# Patient Record
Sex: Male | Born: 1960 | Race: White | Hispanic: No | Marital: Single | Smoking: Current every day smoker
Health system: Southern US, Community
[De-identification: ages and names within clinical notes are randomized; demographics above are authoritative.]

## PROBLEM LIST (undated history)

## (undated) DIAGNOSIS — G4733 Obstructive sleep apnea (adult) (pediatric): Secondary | ICD-10-CM

## (undated) DIAGNOSIS — F329 Major depressive disorder, single episode, unspecified: Secondary | ICD-10-CM

## (undated) DIAGNOSIS — K219 Gastro-esophageal reflux disease without esophagitis: Secondary | ICD-10-CM

## (undated) DIAGNOSIS — T7840XA Allergy, unspecified, initial encounter: Secondary | ICD-10-CM

## (undated) DIAGNOSIS — G4730 Sleep apnea, unspecified: Secondary | ICD-10-CM

## (undated) DIAGNOSIS — I10 Essential (primary) hypertension: Secondary | ICD-10-CM

## (undated) DIAGNOSIS — I739 Peripheral vascular disease, unspecified: Secondary | ICD-10-CM

## (undated) DIAGNOSIS — F32A Depression, unspecified: Secondary | ICD-10-CM

## (undated) DIAGNOSIS — R112 Nausea with vomiting, unspecified: Secondary | ICD-10-CM

## (undated) DIAGNOSIS — Z21 Asymptomatic human immunodeficiency virus [HIV] infection status: Secondary | ICD-10-CM

## (undated) DIAGNOSIS — E785 Hyperlipidemia, unspecified: Secondary | ICD-10-CM

## (undated) DIAGNOSIS — E119 Type 2 diabetes mellitus without complications: Secondary | ICD-10-CM

## (undated) DIAGNOSIS — B20 Human immunodeficiency virus [HIV] disease: Secondary | ICD-10-CM

## (undated) DIAGNOSIS — Z9889 Other specified postprocedural states: Secondary | ICD-10-CM

## (undated) DIAGNOSIS — F419 Anxiety disorder, unspecified: Secondary | ICD-10-CM

## (undated) DIAGNOSIS — I6200 Nontraumatic subdural hemorrhage, unspecified: Secondary | ICD-10-CM

## (undated) DIAGNOSIS — D126 Benign neoplasm of colon, unspecified: Secondary | ICD-10-CM

## (undated) HISTORY — DX: Essential (primary) hypertension: I10

## (undated) HISTORY — DX: Type 2 diabetes mellitus without complications: E11.9

## (undated) HISTORY — DX: Anxiety disorder, unspecified: F41.9

## (undated) HISTORY — DX: Obstructive sleep apnea (adult) (pediatric): G47.33

## (undated) HISTORY — DX: Benign neoplasm of colon, unspecified: D12.6

## (undated) HISTORY — DX: Nontraumatic subdural hemorrhage, unspecified: I62.00

## (undated) HISTORY — DX: Major depressive disorder, single episode, unspecified: F32.9

## (undated) HISTORY — DX: Asymptomatic human immunodeficiency virus (hiv) infection status: Z21

## (undated) HISTORY — DX: Hyperlipidemia, unspecified: E78.5

## (undated) HISTORY — DX: Depression, unspecified: F32.A

## (undated) HISTORY — PX: CARDIAC CATHETERIZATION: SHX172

## (undated) HISTORY — DX: Allergy, unspecified, initial encounter: T78.40XA

## (undated) HISTORY — DX: Human immunodeficiency virus (HIV) disease: B20

---

## 2000-12-04 ENCOUNTER — Ambulatory Visit (HOSPITAL_COMMUNITY): Admission: RE | Admit: 2000-12-04 | Discharge: 2000-12-04 | Payer: Self-pay | Admitting: General Surgery

## 2000-12-04 ENCOUNTER — Encounter: Payer: Self-pay | Admitting: General Surgery

## 2000-12-04 HISTORY — PX: OTHER SURGICAL HISTORY: SHX169

## 2005-01-21 ENCOUNTER — Encounter: Admission: RE | Admit: 2005-01-21 | Discharge: 2005-01-21 | Payer: Self-pay | Admitting: Gastroenterology

## 2010-10-16 ENCOUNTER — Ambulatory Visit (INDEPENDENT_AMBULATORY_CARE_PROVIDER_SITE_OTHER): Payer: BC Managed Care – PPO

## 2010-10-16 DIAGNOSIS — B20 Human immunodeficiency virus [HIV] disease: Secondary | ICD-10-CM

## 2010-10-16 DIAGNOSIS — Z23 Encounter for immunization: Secondary | ICD-10-CM

## 2010-10-16 DIAGNOSIS — Z113 Encounter for screening for infections with a predominantly sexual mode of transmission: Secondary | ICD-10-CM

## 2010-10-16 DIAGNOSIS — Z79899 Other long term (current) drug therapy: Secondary | ICD-10-CM

## 2010-10-16 DIAGNOSIS — Z111 Encounter for screening for respiratory tuberculosis: Secondary | ICD-10-CM

## 2010-10-16 LAB — URINALYSIS
Bilirubin Urine: NEGATIVE
Glucose, UA: NEGATIVE mg/dL
Hgb urine dipstick: NEGATIVE
Ketones, ur: NEGATIVE mg/dL
Leukocytes, UA: NEGATIVE
Nitrite: NEGATIVE
Protein, ur: NEGATIVE mg/dL
Specific Gravity, Urine: 1.023 (ref 1.005–1.030)
Urobilinogen, UA: 0.2 mg/dL (ref 0.0–1.0)
pH: 7 (ref 5.0–8.0)

## 2010-10-17 LAB — CBC WITH DIFFERENTIAL/PLATELET
Basophils Absolute: 0 10*3/uL (ref 0.0–0.1)
Basophils Relative: 0 % (ref 0–1)
Eosinophils Absolute: 0.1 10*3/uL (ref 0.0–0.7)
Eosinophils Relative: 2 % (ref 0–5)
HCT: 41.4 % (ref 39.0–52.0)
Hemoglobin: 13.5 g/dL (ref 13.0–17.0)
Lymphocytes Relative: 43 % (ref 12–46)
Lymphs Abs: 2.9 10*3/uL (ref 0.7–4.0)
MCH: 31.8 pg (ref 26.0–34.0)
MCHC: 32.6 g/dL (ref 30.0–36.0)
MCV: 97.4 fL (ref 78.0–100.0)
Monocytes Absolute: 0.5 10*3/uL (ref 0.1–1.0)
Monocytes Relative: 7 % (ref 3–12)
Neutro Abs: 3.2 10*3/uL (ref 1.7–7.7)
Neutrophils Relative %: 48 % (ref 43–77)
Platelets: 251 10*3/uL (ref 150–400)
RBC: 4.25 MIL/uL (ref 4.22–5.81)
RDW: 13.5 % (ref 11.5–15.5)
WBC: 6.7 10*3/uL (ref 4.0–10.5)

## 2010-10-17 LAB — COMPLETE METABOLIC PANEL WITH GFR
ALT: 22 U/L (ref 0–53)
AST: 20 U/L (ref 0–37)
Albumin: 4.8 g/dL (ref 3.5–5.2)
Alkaline Phosphatase: 54 U/L (ref 39–117)
BUN: 12 mg/dL (ref 6–23)
CO2: 22 mEq/L (ref 19–32)
Calcium: 9.3 mg/dL (ref 8.4–10.5)
Chloride: 105 mEq/L (ref 96–112)
Creat: 0.94 mg/dL (ref 0.50–1.35)
GFR, Est African American: >60 mL/min (ref >60)
GFR, Est Non African American: >60 mL/min (ref >60)
Glucose, Bld: 87 mg/dL (ref 70–99)
Potassium: 4.1 mEq/L (ref 3.5–5.3)
Sodium: 135 mEq/L (ref 135–145)
Total Bilirubin: 0.8 mg/dL (ref 0.3–1.2)
Total Protein: 7.6 g/dL (ref 6.0–8.3)

## 2010-10-17 LAB — LIPID PANEL
Cholesterol: 144 mg/dL (ref 0–200)
HDL: 25 mg/dL — ABNORMAL LOW (ref >39)
LDL Cholesterol: 68 mg/dL (ref 0–99)
Total CHOL/HDL Ratio: 5.8 Ratio
Triglycerides: 257 mg/dL — ABNORMAL HIGH (ref <150)
VLDL: 51 mg/dL — ABNORMAL HIGH (ref 0–40)

## 2010-10-17 LAB — HEPATITIS C ANTIBODY: HCV Ab: NEGATIVE

## 2010-10-17 LAB — GC/CHLAMYDIA PROBE AMP, URINE
Chlamydia, Swab/Urine, PCR: NEGATIVE
GC Probe Amp, Urine: NEGATIVE

## 2010-10-17 LAB — RPR

## 2010-10-17 LAB — HEPATITIS B SURFACE ANTIGEN: Hepatitis B Surface Ag: NEGATIVE

## 2010-10-17 LAB — HEPATITIS A ANTIBODY, TOTAL: Hep A Total Ab: NEGATIVE

## 2010-10-17 LAB — HEPATITIS B SURFACE ANTIBODY,QUALITATIVE: Hep B S Ab: POSITIVE — AB

## 2010-10-17 LAB — HEPATITIS B CORE ANTIBODY, TOTAL: Hep B Core Total Ab: POSITIVE — AB

## 2010-10-17 NOTE — Progress Notes (Signed)
Pt is very upset and anxious re: new diagnosis of HIV.  He has always tried to be safe.  He had refrained from anal intercourse with the hopes of not contracting the virus.  He only participates in oral sex and says there is not exchange of body fluids but does not use condom. He continues to repeat he was being :"very careful".

## 2010-10-18 LAB — T-HELPER CELL (CD4) - (RCID CLINIC ONLY)
CD4 % Helper T Cell: 33 % (ref 33–55)
CD4 T Cell Abs: 1020 uL (ref 400–2700)

## 2010-10-19 LAB — TB SKIN TEST: TB Skin Test: NEGATIVE mm

## 2010-10-19 LAB — HIV-1 RNA ULTRAQUANT REFLEX TO GENTYP+
HIV 1 RNA Quant: 2820 copies/mL — ABNORMAL HIGH (ref <20)
HIV-1 RNA Quant, Log: 3.45 {Log} — ABNORMAL HIGH (ref <1.30)

## 2010-10-23 ENCOUNTER — Ambulatory Visit: Payer: Self-pay

## 2010-10-25 LAB — HIV-1 GENOTYPR PLUS

## 2010-11-01 ENCOUNTER — Ambulatory Visit (INDEPENDENT_AMBULATORY_CARE_PROVIDER_SITE_OTHER): Payer: BC Managed Care – PPO | Admitting: Infectious Diseases

## 2010-11-01 ENCOUNTER — Encounter: Payer: Self-pay | Admitting: Infectious Diseases

## 2010-11-01 DIAGNOSIS — E785 Hyperlipidemia, unspecified: Secondary | ICD-10-CM

## 2010-11-01 DIAGNOSIS — I10 Essential (primary) hypertension: Secondary | ICD-10-CM

## 2010-11-01 DIAGNOSIS — Z23 Encounter for immunization: Secondary | ICD-10-CM

## 2010-11-01 DIAGNOSIS — B20 Human immunodeficiency virus [HIV] disease: Secondary | ICD-10-CM

## 2010-11-01 DIAGNOSIS — Z720 Tobacco use: Secondary | ICD-10-CM | POA: Insufficient documentation

## 2010-11-01 DIAGNOSIS — Z21 Asymptomatic human immunodeficiency virus [HIV] infection status: Secondary | ICD-10-CM

## 2010-11-01 DIAGNOSIS — F17200 Nicotine dependence, unspecified, uncomplicated: Secondary | ICD-10-CM

## 2010-11-01 NOTE — Assessment & Plan Note (Signed)
He will continue to follow up at [facility]

## 2010-11-01 NOTE — Assessment & Plan Note (Signed)
His CD4 1020 and VL is 2820. He is doing very well, it is unclear how long he has been infected. He is offered condoms, states he is not sexually active. He has had pnvx in last 5 years, has had flu shot this year. Will send him to [facility] for possible start study. Start Hep A series. Will see him back in 2 months.

## 2010-11-01 NOTE — Assessment & Plan Note (Signed)
counseled to quit.  

## 2010-11-01 NOTE — Progress Notes (Signed)
  Subjective:    Patient ID: Jacob Ali, male    DOB: [birth year], 50 y.o.   MRN: [id number]  HPI 50 yo M with hx of HTN, hyperlipidemia comes to ID clinic after new HIV+ September 2012. States he had test done due to low RBC. Doesn't have that much sex. Denies illnesses- in the spring of 2012 was more tired. Noted to have low RBC and testosterone then.     Review of Systems  Constitutional: Positive for fatigue. Negative for fever, chills and unexpected weight change.  HENT: Negative for mouth sores.   Respiratory: Negative for cough and shortness of breath.   Cardiovascular: Negative for chest pain.  Gastrointestinal: Negative for diarrhea, constipation and blood in stool.  Genitourinary: Negative for dysuria.  Neurological: Negative for numbness.       Objective:   Physical Exam  Constitutional: He appears well-developed and well-nourished.  Eyes: EOM are normal. Pupils are equal, round, and reactive to light.  Neck: Neck supple.  Cardiovascular: Normal rate, regular rhythm and normal heart sounds.   Pulmonary/Chest: Effort normal and breath sounds normal.  Abdominal: Soft. Bowel sounds are normal. There is no tenderness.  Lymphadenopathy:    He has no cervical adenopathy.          Assessment & Plan:

## 2010-11-01 NOTE — Assessment & Plan Note (Signed)
He will continue to follow up at [facility] 

## 2010-11-09 ENCOUNTER — Ambulatory Visit (INDEPENDENT_AMBULATORY_CARE_PROVIDER_SITE_OTHER): Payer: BC Managed Care – PPO | Admitting: *Deleted

## 2010-11-09 VITALS — BP 121/78 | HR 72 | Temp 98.1°F | Resp 18 | Ht 69.0 in | Wt 178.2 lb

## 2010-11-09 DIAGNOSIS — B20 Human immunodeficiency virus [HIV] disease: Secondary | ICD-10-CM

## 2010-11-09 LAB — COMPREHENSIVE METABOLIC PANEL
ALT: 20 U/L (ref 0–53)
AST: 18 U/L (ref 0–37)
Albumin: 4.9 g/dL (ref 3.5–5.2)
Alkaline Phosphatase: 56 U/L (ref 39–117)
BUN: 15 mg/dL (ref 6–23)
CO2: 23 mEq/L (ref 19–32)
Calcium: 9.8 mg/dL (ref 8.4–10.5)
Chloride: 102 mEq/L (ref 96–112)
Creat: 0.94 mg/dL (ref 0.50–1.35)
Glucose, Bld: 84 mg/dL (ref 70–99)
Potassium: 4.2 mEq/L (ref 3.5–5.3)
Sodium: 136 mEq/L (ref 135–145)
Total Bilirubin: 0.6 mg/dL (ref 0.3–1.2)
Total Protein: 7.9 g/dL (ref 6.0–8.3)

## 2010-11-09 NOTE — Progress Notes (Signed)
Patient here for START screening. Informed consent obtained after discussion. He had read over the consent and watched the START video at home before coming in today. He understands he may be randomized to early meds or deferred meds. He denies any current problems or concerns. We attempted the pulmonary spirometry procedure today but were unable to get a qualifying reading. He will attempt it again at the next visit. We will also do fasting labs and EKG at the next visit as well as another CD4. He will return November 13th for the next visit.

## 2010-11-10 LAB — HIV ANTIBODY (ROUTINE TESTING W REFLEX): HIV: REACTIVE

## 2010-11-13 LAB — HIV 1/2 CONFIRMATION
HIV-1 antibody: POSITIVE — AB
HIV-2 Ab: NEGATIVE

## 2010-11-27 ENCOUNTER — Ambulatory Visit (INDEPENDENT_AMBULATORY_CARE_PROVIDER_SITE_OTHER): Payer: BC Managed Care – PPO | Admitting: *Deleted

## 2010-11-27 ENCOUNTER — Encounter: Payer: Self-pay | Admitting: *Deleted

## 2010-11-27 VITALS — BP 140/89 | HR 85 | Temp 98.3°F | Resp 18 | Wt 179.8 lb

## 2010-11-27 DIAGNOSIS — B20 Human immunodeficiency virus [HIV] disease: Secondary | ICD-10-CM

## 2010-11-27 LAB — LIPID PANEL
Cholesterol: 134 mg/dL (ref 0–200)
HDL: 25 mg/dL — ABNORMAL LOW (ref >39)
LDL Cholesterol: 80 mg/dL (ref 0–99)
Total CHOL/HDL Ratio: 5.4 Ratio
Triglycerides: 147 mg/dL (ref <150)
VLDL: 29 mg/dL (ref 0–40)

## 2010-11-27 LAB — POCT URINALYSIS DIPSTICK
Bilirubin, UA: NEGATIVE
Glucose, UA: NEGATIVE
Ketones, UA: NEGATIVE
Leukocytes, UA: NEGATIVE
Nitrite, UA: NEGATIVE
Protein, UA: NEGATIVE
Spec Grav, UA: 1.02
Urobilinogen, UA: 0.2
pH, UA: 7

## 2010-11-27 LAB — BASIC METABOLIC PANEL
BUN: 9 mg/dL (ref 6–23)
CO2: 27 mEq/L (ref 19–32)
Calcium: 9.8 mg/dL (ref 8.4–10.5)
Chloride: 100 mEq/L (ref 96–112)
Creat: 0.92 mg/dL (ref 0.50–1.35)
Glucose, Bld: 96 mg/dL (ref 70–99)
Potassium: 4.5 mEq/L (ref 3.5–5.3)
Sodium: 133 mEq/L — ABNORMAL LOW (ref 135–145)

## 2010-11-27 NOTE — Progress Notes (Signed)
11/27/2010 @ 0900: Pt here for 2nd part of screening for START study. Pt c/o sinus pressure and productive cough that started 11/25/10. Pt states he feels like he is coming down with a cold. He plans to see PA, for this, at his work today. Due to new symptoms pt does not meet eligibility for pulmonary substudy. EKG completed and reads NSR. E-measurement = 160, V6-measurement = 095. Fasting labs and 2nd CD4 drawn; vital signs stable; Informed patient he will be contacted to let him know if meets criteria and if he still wants to enroll.  -- Tacey Heap RN

## 2010-12-21 ENCOUNTER — Encounter: Payer: Self-pay | Admitting: Infectious Diseases

## 2010-12-21 LAB — CD4/CD8 (T-HELPER/T-SUPPRESSOR CELL)
CD4%: 29.3
CD4%: 29.7
CD4: 624
CD4: 908
CD8 % Suppressor T Cell: 57.4
CD8 % Suppressor T Cell: 59.9
CD8: 1205
CD8: 1857

## 2011-01-25 ENCOUNTER — Ambulatory Visit (INDEPENDENT_AMBULATORY_CARE_PROVIDER_SITE_OTHER): Payer: BC Managed Care – PPO | Admitting: *Deleted

## 2011-01-25 VITALS — BP 126/81 | HR 80 | Temp 98.4°F | Resp 18 | Wt 177.2 lb

## 2011-01-25 DIAGNOSIS — Z21 Asymptomatic human immunodeficiency virus [HIV] infection status: Secondary | ICD-10-CM

## 2011-01-25 DIAGNOSIS — B20 Human immunodeficiency virus [HIV] disease: Secondary | ICD-10-CM

## 2011-01-25 LAB — POCT URINALYSIS DIPSTICK
Bilirubin, UA: NEGATIVE
Glucose, UA: NEGATIVE
Ketones, UA: NEGATIVE
Leukocytes, UA: NEGATIVE
Nitrite, UA: NEGATIVE
Protein, UA: NEGATIVE
Spec Grav, UA: >=1.03
Urobilinogen, UA: 0.2
pH, UA: 5.5

## 2011-01-25 NOTE — Progress Notes (Signed)
Patient here for 1 month START study appt. He denies any new problems or concerns. He continues to have some minor drainage and cough issues. He plans to see Dr. Ninetta Lights in April after his next study appt. He will need his second Hep A vaccine then.

## 2011-01-26 LAB — BASIC METABOLIC PANEL
BUN: 15 mg/dL (ref 6–23)
CO2: 23 mEq/L (ref 19–32)
Calcium: 9.4 mg/dL (ref 8.4–10.5)
Chloride: 104 mEq/L (ref 96–112)
Creat: 0.87 mg/dL (ref 0.50–1.35)
Glucose, Bld: 90 mg/dL (ref 70–99)
Potassium: 4.1 mEq/L (ref 3.5–5.3)
Sodium: 135 mEq/L (ref 135–145)

## 2011-01-28 ENCOUNTER — Telehealth: Payer: Self-pay | Admitting: *Deleted

## 2011-01-28 NOTE — Telephone Encounter (Signed)
FMLA forms mailed to [employer], [address] [address] [place], [place] [address].  Copies made and placed in box to be scanned. Wendall Mola CMA

## 2011-01-29 LAB — HIV-1 RNA QUANT-NO REFLEX-BLD
HIV 1 RNA Quant: 2666 copies/mL — ABNORMAL HIGH (ref <20)
HIV-1 RNA Quant, Log: 3.43 {Log} — ABNORMAL HIGH (ref <1.30)

## 2011-02-06 ENCOUNTER — Encounter: Payer: Self-pay | Admitting: Infectious Diseases

## 2011-02-06 ENCOUNTER — Telehealth: Payer: Self-pay | Admitting: *Deleted

## 2011-02-06 LAB — CD4/CD8 (T-HELPER/T-SUPPRESSOR CELL)
CD4%: 27.7
CD4: 942
CD8 % Suppressor T Cell: 61.7
CD8: 2098

## 2011-02-06 NOTE — Telephone Encounter (Signed)
Patient brought in FMLA that needed corrections done.  Dr. Ninetta Lights made changes and it was mailed back to [employer].  Copy put in box to be scanned. Wendall Mola CMA

## 2011-02-12 ENCOUNTER — Encounter: Payer: Self-pay | Admitting: Infectious Diseases

## 2011-02-20 ENCOUNTER — Encounter (HOSPITAL_COMMUNITY): Payer: Self-pay | Admitting: Emergency Medicine

## 2011-02-20 ENCOUNTER — Other Ambulatory Visit: Payer: Self-pay

## 2011-02-20 ENCOUNTER — Inpatient Hospital Stay (HOSPITAL_COMMUNITY)
Admission: EM | Admit: 2011-02-20 | Discharge: 2011-02-25 | DRG: 449 | Disposition: A | Discharge status: Other Acute Inpatient Hospital | Payer: BC Managed Care – PPO | Attending: Internal Medicine | Admitting: Internal Medicine

## 2011-02-20 DIAGNOSIS — R000 Tachycardia, unspecified: Secondary | ICD-10-CM | POA: Diagnosis present

## 2011-02-20 DIAGNOSIS — E871 Hypo-osmolality and hyponatremia: Secondary | ICD-10-CM | POA: Diagnosis present

## 2011-02-20 DIAGNOSIS — S61509A Unspecified open wound of unspecified wrist, initial encounter: Secondary | ICD-10-CM | POA: Diagnosis present

## 2011-02-20 DIAGNOSIS — T1491XA Suicide attempt, initial encounter: Secondary | ICD-10-CM | POA: Diagnosis present

## 2011-02-20 DIAGNOSIS — Z21 Asymptomatic human immunodeficiency virus [HIV] infection status: Secondary | ICD-10-CM | POA: Diagnosis present

## 2011-02-20 DIAGNOSIS — I10 Essential (primary) hypertension: Secondary | ICD-10-CM | POA: Insufficient documentation

## 2011-02-20 DIAGNOSIS — Z720 Tobacco use: Secondary | ICD-10-CM | POA: Insufficient documentation

## 2011-02-20 DIAGNOSIS — F17200 Nicotine dependence, unspecified, uncomplicated: Secondary | ICD-10-CM | POA: Diagnosis present

## 2011-02-20 DIAGNOSIS — T394X2A Poisoning by antirheumatics, not elsewhere classified, intentional self-harm, initial encounter: Secondary | ICD-10-CM | POA: Diagnosis present

## 2011-02-20 DIAGNOSIS — X789XXA Intentional self-harm by unspecified sharp object, initial encounter: Secondary | ICD-10-CM | POA: Diagnosis present

## 2011-02-20 DIAGNOSIS — E785 Hyperlipidemia, unspecified: Secondary | ICD-10-CM | POA: Diagnosis present

## 2011-02-20 DIAGNOSIS — T398X2A Poisoning by other nonopioid analgesics and antipyretics, not elsewhere classified, intentional self-harm, initial encounter: Secondary | ICD-10-CM | POA: Diagnosis present

## 2011-02-20 DIAGNOSIS — E876 Hypokalemia: Secondary | ICD-10-CM | POA: Diagnosis present

## 2011-02-20 DIAGNOSIS — T391X1A Poisoning by 4-Aminophenol derivatives, accidental (unintentional), initial encounter: Principal | ICD-10-CM | POA: Diagnosis present

## 2011-02-20 DIAGNOSIS — S61519A Laceration without foreign body of unspecified wrist, initial encounter: Secondary | ICD-10-CM | POA: Diagnosis present

## 2011-02-20 DIAGNOSIS — B20 Human immunodeficiency virus [HIV] disease: Secondary | ICD-10-CM | POA: Insufficient documentation

## 2011-02-20 HISTORY — DX: Other specified postprocedural states: Z98.890

## 2011-02-20 HISTORY — DX: Nausea with vomiting, unspecified: R11.2

## 2011-02-20 HISTORY — DX: Other specified postprocedural states: R11.2

## 2011-02-20 LAB — COMPREHENSIVE METABOLIC PANEL
ALT: 27 U/L (ref 0–53)
AST: 26 U/L (ref 0–37)
Albumin: 4.6 g/dL (ref 3.5–5.2)
Alkaline Phosphatase: 46 U/L (ref 39–117)
BUN: 24 mg/dL — ABNORMAL HIGH (ref 6–23)
CO2: 19 mEq/L (ref 19–32)
Calcium: 9.2 mg/dL (ref 8.4–10.5)
Chloride: 98 mEq/L (ref 96–112)
Creatinine, Ser: 1.2 mg/dL (ref 0.50–1.35)
GFR calc Af Amer: 79 mL/min — ABNORMAL LOW (ref >90)
GFR calc non Af Amer: 68 mL/min — ABNORMAL LOW (ref >90)
Glucose, Bld: 121 mg/dL — ABNORMAL HIGH (ref 70–99)
Potassium: 3.8 mEq/L (ref 3.5–5.1)
Sodium: 129 mEq/L — ABNORMAL LOW (ref 135–145)
Total Bilirubin: 0.4 mg/dL (ref 0.3–1.2)
Total Protein: 8.1 g/dL (ref 6.0–8.3)

## 2011-02-20 LAB — URINALYSIS, ROUTINE W REFLEX MICROSCOPIC
Bilirubin Urine: NEGATIVE
Glucose, UA: NEGATIVE mg/dL
Hgb urine dipstick: NEGATIVE
Leukocytes, UA: NEGATIVE
Nitrite: NEGATIVE
Protein, ur: NEGATIVE mg/dL
Specific Gravity, Urine: 1.041 — ABNORMAL HIGH (ref 1.005–1.030)
Urobilinogen, UA: 0.2 mg/dL (ref 0.0–1.0)
pH: 5 (ref 5.0–8.0)

## 2011-02-20 LAB — DIFFERENTIAL
Basophils Absolute: 0 10*3/uL (ref 0.0–0.1)
Basophils Relative: 0 % (ref 0–1)
Eosinophils Absolute: 0 10*3/uL (ref 0.0–0.7)
Eosinophils Relative: 0 % (ref 0–5)
Lymphocytes Relative: 26 % (ref 12–46)
Lymphs Abs: 1.5 10*3/uL (ref 0.7–4.0)
Monocytes Absolute: 0.5 10*3/uL (ref 0.1–1.0)
Monocytes Relative: 8 % (ref 3–12)
Neutro Abs: 3.8 10*3/uL (ref 1.7–7.7)
Neutrophils Relative %: 66 % (ref 43–77)

## 2011-02-20 LAB — ACETAMINOPHEN LEVEL
Acetaminophen (Tylenol), Serum: 75.8 ug/mL — ABNORMAL HIGH (ref 10–30)
Acetaminophen (Tylenol), Serum: 37.4 ug/mL — ABNORMAL HIGH (ref 10–30)
Acetaminophen (Tylenol), Serum: <15 ug/mL (ref 10–30)

## 2011-02-20 LAB — SALICYLATE LEVEL: Salicylate Lvl: <2 mg/dL — ABNORMAL LOW (ref 2.8–20.0)

## 2011-02-20 LAB — CBC
HCT: 38.1 % — ABNORMAL LOW (ref 39.0–52.0)
Hemoglobin: 13.1 g/dL (ref 13.0–17.0)
MCH: 32.1 pg (ref 26.0–34.0)
MCHC: 34.4 g/dL (ref 30.0–36.0)
MCV: 93.4 fL (ref 78.0–100.0)
Platelets: 274 10*3/uL (ref 150–400)
RBC: 4.08 MIL/uL — ABNORMAL LOW (ref 4.22–5.81)
RDW: 12.8 % (ref 11.5–15.5)
WBC: 5.8 10*3/uL (ref 4.0–10.5)

## 2011-02-20 LAB — RAPID URINE DRUG SCREEN, HOSP PERFORMED
Amphetamines: NOT DETECTED
Barbiturates: NOT DETECTED
Benzodiazepines: NOT DETECTED
Cocaine: NOT DETECTED
Opiates: NOT DETECTED
Tetrahydrocannabinol: NOT DETECTED

## 2011-02-20 LAB — PROTIME-INR
INR: 1.19 (ref 0.00–1.49)
Prothrombin Time: 15.4 seconds — ABNORMAL HIGH (ref 11.6–15.2)

## 2011-02-20 MED ORDER — HYDROCHLOROTHIAZIDE 25 MG PO TABS
25.0000 mg | ORAL_TABLET | Freq: Every day | ORAL | Status: DC
  Filled 2011-02-20: qty 1

## 2011-02-20 MED ORDER — ALUM & MAG HYDROXIDE-SIMETH 200-200-20 MG/5ML PO SUSP
30.0000 mL | Freq: Four times a day (QID) | ORAL | Status: DC | PRN
  Administered 2011-02-20: 30 mL via ORAL
  Filled 2011-02-20: qty 30

## 2011-02-20 MED ORDER — LISINOPRIL-HYDROCHLOROTHIAZIDE 20-12.5 MG PO TABS: 1.0000 | ORAL_TABLET | Freq: Every day | ORAL | Status: DC

## 2011-02-20 MED ORDER — ACETYLCYSTEINE LOAD VIA INFUSION
150.0000 mg/kg | Freq: Once | INTRAVENOUS | Status: AC
  Administered 2011-02-20 (×2): 12060 mg via INTRAVENOUS
  Filled 2011-02-20: qty 302

## 2011-02-20 MED ORDER — SODIUM CHLORIDE 0.9 % IJ SOLN: 3.0000 mL | Freq: Two times a day (BID) | INTRAMUSCULAR | Status: DC

## 2011-02-20 MED ORDER — SODIUM CHLORIDE 0.9 % IV SOLN
250.0000 mL | INTRAVENOUS | Status: DC | PRN
  Administered 2011-02-22: 250 mL via INTRAVENOUS

## 2011-02-20 MED ORDER — SODIUM CHLORIDE 0.9 % IJ SOLN: 3.0000 mL | INTRAMUSCULAR | Status: DC | PRN

## 2011-02-20 MED ORDER — LISINOPRIL 20 MG PO TABS
20.0000 mg | ORAL_TABLET | Freq: Every day | ORAL | Status: DC
  Filled 2011-02-20: qty 1

## 2011-02-20 MED ORDER — GEMFIBROZIL 600 MG PO TABS
600.0000 mg | ORAL_TABLET | Freq: Every day | ORAL | Status: DC
  Administered 2011-02-20 – 2011-02-25 (×6): 600 mg via ORAL
  Filled 2011-02-20 (×7): qty 1

## 2011-02-20 MED ORDER — SODIUM CHLORIDE 0.9 % IV SOLN
INTRAVENOUS | Status: DC
  Administered 2011-02-20 – 2011-02-21 (×2): via INTRAVENOUS
  Administered 2011-02-21: 1000 mL via INTRAVENOUS
  Administered 2011-02-21 – 2011-02-24 (×6): via INTRAVENOUS

## 2011-02-20 MED ORDER — ONDANSETRON HCL 4 MG/2ML IJ SOLN
4.0000 mg | Freq: Four times a day (QID) | INTRAMUSCULAR | Status: DC | PRN
  Administered 2011-02-21: 4 mg via INTRAVENOUS
  Filled 2011-02-20: qty 2

## 2011-02-20 MED ORDER — HYDROCHLOROTHIAZIDE 25 MG PO TABS
25.0000 mg | ORAL_TABLET | Freq: Every day | ORAL | Status: DC
  Administered 2011-02-21 – 2011-02-25 (×5): 25 mg via ORAL
  Filled 2011-02-20 (×6): qty 1

## 2011-02-20 MED ORDER — LORAZEPAM 0.5 MG PO TABS
0.5000 mg | ORAL_TABLET | Freq: Four times a day (QID) | ORAL | Status: DC | PRN
  Administered 2011-02-20 – 2011-02-24 (×6): 0.5 mg via ORAL
  Filled 2011-02-20 (×6): qty 1

## 2011-02-20 MED ORDER — SODIUM CHLORIDE 0.9 % IJ SOLN
3.0000 mL | Freq: Two times a day (BID) | INTRAMUSCULAR | Status: DC
  Administered 2011-02-21 – 2011-02-23 (×3): 3 mL via INTRAVENOUS

## 2011-02-20 MED ORDER — DEXTROSE 5 % IV SOLN: 15.0000 mg/kg/h | INTRAVENOUS | Status: DC

## 2011-02-20 MED ORDER — ACETYLCYSTEINE LOAD VIA INFUSION: 150.0000 mg/kg | Freq: Once | INTRAVENOUS | Status: DC

## 2011-02-20 MED ORDER — VITAMINS A & D EX OINT
TOPICAL_OINTMENT | CUTANEOUS | Status: AC
  Filled 2011-02-20: qty 5

## 2011-02-20 MED ORDER — NICOTINE 21 MG/24HR TD PT24
21.0000 mg | MEDICATED_PATCH | Freq: Every day | TRANSDERMAL | Status: DC
  Administered 2011-02-20 – 2011-02-25 (×6): 21 mg via TRANSDERMAL
  Filled 2011-02-20 (×7): qty 1

## 2011-02-20 MED ORDER — ONDANSETRON HCL 4 MG PO TABS: 4.0000 mg | ORAL_TABLET | Freq: Four times a day (QID) | ORAL | Status: DC | PRN

## 2011-02-20 MED ORDER — ONDANSETRON HCL 4 MG/2ML IJ SOLN
INTRAMUSCULAR | Status: AC
  Administered 2011-02-20: 4 mg
  Filled 2011-02-20: qty 2

## 2011-02-20 MED ORDER — ONDANSETRON HCL 4 MG/2ML IJ SOLN
4.0000 mg | Freq: Once | INTRAMUSCULAR | Status: AC
  Administered 2011-02-20: 4 mg via INTRAVENOUS

## 2011-02-20 MED ORDER — LISINOPRIL 20 MG PO TABS
20.0000 mg | ORAL_TABLET | Freq: Every day | ORAL | Status: DC
  Administered 2011-02-21 – 2011-02-25 (×5): 20 mg via ORAL
  Filled 2011-02-20 (×6): qty 1

## 2011-02-20 MED ORDER — DEXTROSE 5 % IV SOLN
15.0000 mg/kg/h | INTRAVENOUS | Status: AC
  Filled 2011-02-20: qty 200

## 2011-02-20 NOTE — ED Notes (Addendum)
Followed up regarding urine, Pt still unable to void.  Urinal at bedside.

## 2011-02-20 NOTE — H&P (Addendum)
Hospital Admission Note Date: 02/20/2011  Patient name: Jacob Ali Medical record number: [id number] Date of birth: May 03, 1960 Age: 51 y.o. Gender: male PCP: Katy Apo, MD, MD  Attending physician: Geoffery Lyons, MD Emergency Contact: Thurman Sarver (Mother) [phone] Code Status:  Full code  Chief Complaint: Suicide attempt  History of Present Illness: Jacob Ali is an 51 y.o. male with a PMH of depression and HIV who attempted suicide by slashing wrists and who took 60-70 tylenol PM tablets last night at around 3 a.m last night.  The patient's sister (who he works with), went looking for him when he did not return her phone calls, so she went to his home and found him somnolent in the bathtub with slit wrists.  The patient's sister then called 911 and he was brought to the hospital for further evaluation and treatment.  His wrist lacerations were sutured by the ER physician.  Upon further evaluation, he was found to have an elevated acetaminophen level and has been referred to the hospitalist service for further evaluation and treatment of his drug overdose. His suicide attempt appears to have been triggered from some sort of legal trouble which he states that he is not at liberty to discuss upon advice from his attorney.  Past Medical History Past Medical History  Diagnosis Date  . HIV infection   . Hyperlipidemia   . Hypertension     Past Surgical History Past Surgical History  Procedure Date  . Examination under anesthesia, repair of anal fissure, 12/04/2000    Meds: Prior to Admission medications   Medication Sig Start Date End Date Taking? Authorizing Provider  diphenhydramine-acetaminophen (TYLENOL PM) 25-500 MG TABS Take 1-2 tablets by mouth at bedtime as needed.   Yes Historical Provider, MD  gemfibrozil (LOPID) 600 MG tablet Take 600 mg by mouth daily.    Yes Historical Provider, MD  lisinopril-hydrochlorothiazide (PRINZIDE,ZESTORETIC) 20-12.5 MG per tablet Take 1  tablet by mouth daily.   Yes Historical Provider, MD    Allergies: Review of patient's allergies indicates no known allergies.  Social History: History   Social History  . Marital Status: Single    Spouse Name: N/A    Number of Children: N/A  . Years of Education: 12   Occupational History  . Pricing Photographer   Social History Main Topics  . Smoking status: Current Everyday Smoker -- 1.0 packs/day for 40 years    Types: Cigarettes  . Smokeless tobacco: Never Used  . Alcohol Use: Yes     beer occasional, no ETOH in past several months.  . Drug Use: Yes    Special: Marijuana  . Sexually Active: Not Currently     pt. declined condoms   Other Topics Concern  . Not on file   Social History Narrative   Single, lives alone.      Family History:  Family History  Problem Relation Age of Onset  . Throat cancer Father   . Heart disease Father   . Cancer Maternal Grandmother     colon    Review of Systems: Constitutional: No fever or chills;  Appetite normal; No weight loss.  HEENT: No blurry vision or diplopia, no pharyngitis or dysphagia CV: No chest pain or arrhythmia.  Resp: No SOB, no cough. GI: No N/V, no diarrhea, no melena or hematochezia.  GU: No dysuria or hematuria.  MSK: no myalgias/arthralgias.  Neuro:  No headache or focal neurological deficits.  Psych: + depression and anxiety.  Endo:  No thyroid disease or DM.  Skin: No rashes or lesions.  Heme: No anemia or blood dyscrasia   Physical Exam: Blood pressure 155/87, pulse 79, temperature 98.5 F (36.9 C), temperature source Oral, resp. rate 16, weight 80.4 kg (177 lb 4 oz), SpO2 99.00%. BP 155/87  Pulse 79  Temp(Src) 98.5 F (36.9 C) (Oral)  Resp 16  Wt 80.4 kg (177 lb 4 oz)  SpO2 99%  General Appearance:    Alert, cooperative, depressed affect  Head:    Normocephalic, without obvious abnormality, atraumatic  Eyes:    PERRL, conjunctiva/corneas clear, EOM's intact      Ears:    Normal  external ear canals, both ears  Nose:   Nares normal, septum midline, mucosa normal, no drainage    or sinus tenderness  Throat:   Lips, mucosa, and tongue dry; teeth and gums normal  Neck:   Supple, symmetrical, trachea midline, no adenopathy;       thyroid:  No enlargement/tenderness/nodules; no carotid   bruit or JVD  Back:     Symmetric, no curvature, ROM normal, no CVA tenderness  Lungs:     Clear to auscultation bilaterally, respirations unlabored  Chest wall:    No tenderness or deformity  Heart:    Regular rate and rhythm, S1 and S2 normal, no murmur, rub   or gallop  Abdomen:     Soft, non-tender, bowel sounds active all four quadrants,    no masses, no organomegaly  Extremities:   Extremities normal, atraumatic, no cyanosis or edema  Pulses:   2+ and symmetric all extremities  Skin:   Skin color, texture, turgor normal, no rashes or lesions  Neurologic:   Non-focal   Lab results: Basic Metabolic Panel:  Lab 02/20/11 0960  NA 129*  K 3.8  CL 98  CO2 19  GLUCOSE 121*  BUN 24*  CREATININE 1.20  CALCIUM 9.2  MG --  PHOS --   GFR The CrCl is unknown because both a height and weight (above a minimum accepted value) are required for this calculation. Liver Function Tests:  Lab 02/20/11 1158  AST 26  ALT 27  ALKPHOS 46  BILITOT 0.4  PROT 8.1  ALBUMIN 4.6    CBC:  Lab 02/20/11 1158  WBC 5.8  NEUTROABS 3.8  HGB 13.1  HCT 38.1*  MCV 93.4  PLT 274    Ref. Range 02/20/2011 11:58  Acetaminophen (Tylenol), Serum Latest Range: 10-30 ug/mL 75.8 (H)    Imaging results:  No results found.  Assessment & Plan: Principal Problem:  *Acetaminophen overdose The patient will be admitted and monitored closely for acute liver toxicity. We will start him on Mucomyst and monitor his Tylenol levels every 6 hours x6. We'll check his liver function studies and PT/INR in the morning. Active Problems:  HIV (human immunodeficiency virus infection) Sees Dr. Ninetta Lights for this and  is not currently on any anti-retroviral treatment. His white blood cell count is normal.  Hypertension Continue the patient's usual home antihypertensives.  Hyperlipidemia Continue the patient's Lopid.  Tobacco abuse Will provide tobacco cessation education and a nicotine patch.  Suicide attempt We will place the patient on suicide precautions and provide a Recruitment consultant. I have requested psychiatry consultation.  Hyponatremia Likely related to dehydration. We'll gently hydrate and monitor his electrolytes.  Laceration of skin of wrist Patient is status post suturing by the emergency department physicians. We'll provide wound care per nursing staff.  Prophylaxis: We'll use PAS hoses for DVT  prophylaxis.  Time Spent On Admission: 1 hour.  Lori Liew 02/20/2011, 2:50 PM Pager [phone]

## 2011-02-20 NOTE — ED Notes (Signed)
Poison Control called and notified of pt.'s drug overdose, spoke to Ms. Tod Persia with recommendations. MD notified.

## 2011-02-20 NOTE — ED Notes (Signed)
Patient aware of need for urine specimen. Patient unable to void at this time. Patient given urinal. Encouraged to call for assistance if needed.   

## 2011-02-20 NOTE — ED Notes (Signed)
Admitting MD at bedside.

## 2011-02-20 NOTE — ED Notes (Signed)
Per EMS, pt. Is from home with drug overdose with over the counter meds at about 50 pcs.  and self inflected laceration on bilateral wrists. Pt. Is alert and oriented x4. No SOB, denies of pain. Claimed of being tired .

## 2011-02-20 NOTE — ED Notes (Signed)
Pt attempted to use urinal, but was unsuccessful.

## 2011-02-20 NOTE — ED Provider Notes (Signed)
History     CSN: [id number]  Arrival date & time 02/20/11  1139   First MD Initiated Contact with Patient 02/20/11 1141      Chief Complaint  Patient presents with  . Drug Overdose    (Consider location/radiation/quality/duration/timing/severity/associated sxs/prior treatment) HPI Comments: Patient was found this morning by family in the bath tub after he had cut his wrists and had taken 50 tylenol PM.  He tells me he took these medications at [facility].  He is somewhat somnolent and is slow to answer questions, but he is appropriate.    Patient is a 51 y.o. male presenting with Overdose. The history is provided by the patient.  Drug Overdose This is a new problem. Episode onset: at 3AM. The problem occurs constantly. The problem has not changed since onset.The symptoms are aggravated by nothing. The symptoms are relieved by nothing. He has tried nothing for the symptoms.    Past Medical History  Diagnosis Date  . HIV infection   . Hyperlipidemia   . Hypertension     History reviewed. No pertinent past surgical history.  Family History  Problem Relation Age of Onset  . Throat cancer Father   . Heart disease Father   . Cancer Maternal Grandmother     colon    History  Substance Use Topics  . Smoking status: Current Everyday Smoker -- 1.0 packs/day for 40 years    Types: Cigarettes  . Smokeless tobacco: Never Used  . Alcohol Use: Yes     beer occasional      Review of Systems  All other systems reviewed and are negative.    Allergies  Review of patient's allergies indicates no known allergies.  Home Medications   Current Outpatient Rx  Name Route Sig Dispense Refill  . GEMFIBROZIL 600 MG PO TABS Oral Take 600 mg by mouth 2 (two) times daily before a meal.      . LISINOPRIL 20 MG PO TABS Oral Take 20 mg by mouth daily.        BP 146/88  Pulse 72  Temp(Src) 98.5 F (36.9 C) (Oral)  Resp 22  SpO2 97%  Physical Exam  Nursing note and vitals  reviewed. Constitutional: He is oriented to person, place, and time. He appears well-developed and well-nourished. No distress.  HENT:  Head: Normocephalic and atraumatic.  Mouth/Throat: Oropharynx is clear and moist.  Eyes: EOM are normal. Pupils are equal, round, and reactive to light.  Neck: Normal range of motion. Neck supple.  Cardiovascular: Normal rate and regular rhythm.  Exam reveals no friction rub.   No murmur heard. Pulmonary/Chest: Effort normal and breath sounds normal. No respiratory distress.  Abdominal: Soft. Bowel sounds are normal. He exhibits no distension. There is no tenderness.  Musculoskeletal: Normal range of motion.       There are lacerations to both wrists, the left is 5 cm and is positioned length-wise to the distal 1/3rd of the forearm. The right is at the wrist and is situated perpendicularly.  There is no tendon involvement or arterial bleeding with either laceration.  Neurological: He is alert and oriented to person, place, and time.  Skin: Skin is warm. He is not diaphoretic.    ED Course  Procedures (including critical care time)   Labs Reviewed  CBC  DIFFERENTIAL  COMPREHENSIVE METABOLIC PANEL  ACETAMINOPHEN LEVEL  SALICYLATE LEVEL  URINE RAPID DRUG SCREEN (HOSP PERFORMED)  URINALYSIS, ROUTINE W REFLEX MICROSCOPIC   No results found.   No  diagnosis found.   Date: 02/20/2011  Rate: 67  Rhythm: normal sinus rhythm  QRS Axis: normal  Intervals: normal  ST/T Wave abnormalities: normal  Conduction Disutrbances:none  Narrative Interpretation:   Old EKG Reviewed: none available  LACERATION REPAIR Performed by: Geoffery Lyons Authorized by: Geoffery Lyons Consent: Verbal consent obtained. Risks and benefits: risks, benefits and alternatives were discussed Consent given by: patient Patient identity confirmed: provided demographic data Prepped and Draped in normal sterile fashion Wound explored  Laceration Location: 2 to Right Wrist, Left  Wrist  Laceration Length: R 5 cm,  L 7.5 cm  No Foreign Bodies seen or palpated  Anesthesia: local infiltration  Local anesthetic: lidocaine 1% without epinephrine  Anesthetic total: 5,5 ml  Irrigation method: syringe Amount of cleaning: standard  Skin closure: Prolene 4-0  Number of sutures: Right: 7,3  Left:  7  Technique: simple interrupted  Patient tolerance: Patient tolerated the procedure well with no immediate complications.   MDM  Spoke with Poison Control.  As acetaminophen level is high, he will require mucomyst for 24 hours and lft monitoring.  Will consult medicine for admission.        Geoffery Lyons, MD 02/20/11 1435

## 2011-02-20 NOTE — ED Notes (Signed)
ZOX:[id number]<AV> Expected date:02/20/11<BR> Expected time:11:36 AM<BR> Means of arrival:Ambulance<BR> Comments:<BR> EMS 70 GC - OD

## 2011-02-21 ENCOUNTER — Other Ambulatory Visit: Payer: Self-pay

## 2011-02-21 DIAGNOSIS — E876 Hypokalemia: Secondary | ICD-10-CM | POA: Diagnosis present

## 2011-02-21 LAB — COMPREHENSIVE METABOLIC PANEL
ALT: 104 U/L — ABNORMAL HIGH (ref 0–53)
ALT: 168 U/L — ABNORMAL HIGH (ref 0–53)
AST: 74 U/L — ABNORMAL HIGH (ref 0–37)
AST: 105 U/L — ABNORMAL HIGH (ref 0–37)
Albumin: 3.9 g/dL (ref 3.5–5.2)
Albumin: 3.8 g/dL (ref 3.5–5.2)
Alkaline Phosphatase: 40 U/L (ref 39–117)
Alkaline Phosphatase: 54 U/L (ref 39–117)
BUN: 15 mg/dL (ref 6–23)
BUN: 15 mg/dL (ref 6–23)
CO2: 18 mEq/L — ABNORMAL LOW (ref 19–32)
CO2: 20 mEq/L (ref 19–32)
Calcium: 8.6 mg/dL (ref 8.4–10.5)
Calcium: 8.4 mg/dL (ref 8.4–10.5)
Chloride: 100 mEq/L (ref 96–112)
Chloride: 101 mEq/L (ref 96–112)
Creatinine, Ser: 0.77 mg/dL (ref 0.50–1.35)
Creatinine, Ser: 0.92 mg/dL (ref 0.50–1.35)
GFR calc Af Amer: >90 mL/min (ref >90)
GFR calc Af Amer: >90 mL/min (ref >90)
GFR calc non Af Amer: >90 mL/min (ref >90)
GFR calc non Af Amer: >90 mL/min (ref >90)
Glucose, Bld: 93 mg/dL (ref 70–99)
Glucose, Bld: 127 mg/dL — ABNORMAL HIGH (ref 70–99)
Potassium: 3.4 mEq/L — ABNORMAL LOW (ref 3.5–5.1)
Potassium: 3.6 mEq/L (ref 3.5–5.1)
Sodium: 130 mEq/L — ABNORMAL LOW (ref 135–145)
Sodium: 129 mEq/L — ABNORMAL LOW (ref 135–145)
Total Bilirubin: 0.4 mg/dL (ref 0.3–1.2)
Total Bilirubin: 0.3 mg/dL (ref 0.3–1.2)
Total Protein: 7.5 g/dL (ref 6.0–8.3)
Total Protein: 7 g/dL (ref 6.0–8.3)

## 2011-02-21 LAB — PROTIME-INR
INR: 1.41 (ref 0.00–1.49)
Prothrombin Time: 17.5 seconds — ABNORMAL HIGH (ref 11.6–15.2)

## 2011-02-21 LAB — ACETAMINOPHEN LEVEL
Acetaminophen (Tylenol), Serum: <15 ug/mL (ref 10–30)
Acetaminophen (Tylenol), Serum: <15 ug/mL (ref 10–30)
Acetaminophen (Tylenol), Serum: <15 ug/mL (ref 10–30)
Acetaminophen (Tylenol), Serum: <15 ug/mL (ref 10–30)

## 2011-02-21 LAB — OSMOLALITY, URINE: Osmolality, Ur: 636 mOsm/kg (ref 390–1090)

## 2011-02-21 MED ORDER — ACETYLCYSTEINE LOAD VIA INFUSION
150.0000 mg/kg | Freq: Once | INTRAVENOUS | Status: AC
  Administered 2011-02-21: 12060 mg via INTRAVENOUS
  Filled 2011-02-21: qty 302

## 2011-02-21 MED ORDER — POTASSIUM CHLORIDE CRYS ER 20 MEQ PO TBCR
40.0000 meq | EXTENDED_RELEASE_TABLET | Freq: Once | ORAL | Status: AC
  Administered 2011-02-21: 40 meq via ORAL
  Filled 2011-02-21: qty 2

## 2011-02-21 MED ORDER — TRAMADOL HCL 50 MG PO TABS
100.0000 mg | ORAL_TABLET | Freq: Two times a day (BID) | ORAL | Status: DC | PRN
  Administered 2011-02-21: 100 mg via ORAL
  Filled 2011-02-21: qty 2

## 2011-02-21 MED ORDER — IBUPROFEN 400 MG PO TABS
400.0000 mg | ORAL_TABLET | Freq: Four times a day (QID) | ORAL | Status: DC | PRN
  Filled 2011-02-21: qty 1

## 2011-02-21 MED ORDER — OXYCODONE HCL 5 MG PO TABS
5.0000 mg | ORAL_TABLET | ORAL | Status: DC | PRN
  Administered 2011-02-21 – 2011-02-24 (×3): 5 mg via ORAL
  Filled 2011-02-21 (×3): qty 1

## 2011-02-21 MED ORDER — DEXTROSE 5 % IV SOLN
15.0000 mg/kg/h | INTRAVENOUS | Status: AC
  Administered 2011-02-21: 15 mg/kg/h via INTRAVENOUS
  Filled 2011-02-21: qty 200

## 2011-02-21 MED ORDER — NITROGLYCERIN 0.4 MG SL SUBL
SUBLINGUAL_TABLET | SUBLINGUAL | Status: AC
  Administered 2011-02-22: 0.4 mg via NASAL
  Filled 2011-02-21: qty 25

## 2011-02-21 NOTE — Progress Notes (Signed)
CARE MANAGEMENT NOTE 02/21/2011  Patient:  Jacob Ali, Jacob Ali   Account Number:  [id number]  Date Initiated:  02/21/2011  Documentation initiated by:  Terri Malerba  Subjective/Objective Assessment:   pt with overdose of tyenol and attempt to committ suicide by slashing wrist was found by family member in bathtub with lacerations and obtuned     Action/Plan:   lives at home by himself may need psych stay   Anticipated DC Date:  02/24/2011   Anticipated DC Plan:  [facility]  In-house referral  Clinical Social Worker      DC Planning Services  NA      [facility] Choice  NA   Choice offered to / List presented to:  NA   DME arranged  NA      DME agency  NA     HH arranged  NA      HH agency  NA   Status of service:  In process, will continue to follow Medicare Important Message given?  NA - LOS <3 / Initial given by admissions (If response is "NO", the following Medicare IM given date fields will be blank) Date Medicare IM given:   Date Additional Medicare IM given:    Discharge Disposition:    Per UR Regulation:  Reviewed for med. necessity/level of care/duration of stay  Comments:  [id number]/Eudell Mcphee,RN,BSn,CCM

## 2011-02-21 NOTE — Progress Notes (Signed)
PATIENT DETAILS Name: Jacob Ali Age: 51 y.o. Sex: male Date of Birth: 06-Feb-1960 Admit Date: 02/20/2011 OZD:[id number] D, MD, MD Emergency contact: Ana Liaw (Mother) [phone]   CONSULTS: 1.  Psychiatry consult pending.  Interval History: Jacob Ali is a 51 year old male with a PMH of HIV and depression who took an overdose of Tylenol PM and cut his wrists on 02/20/11.  He is receiving mucomist.  Psychiatry consultation has been requested.  ROS: Jacob Ali is feeling better today.  Is no longer voicing suicidal thoughts and has hope.     Objective: Vital signs in last 24 hours: Temp:  [97.8 F (36.6 C)-98.5 F (36.9 C)] 98.3 F (36.8 C) (02/07 0417) Pulse Rate:  [72-88] 85  (02/07 0417) Resp:  [16-22] 20  (02/07 0417) BP: (145-165)/(83-98) 145/87 mmHg (02/07 0417) SpO2:  [93 %-99 %] 93 % (02/07 0417) Weight:  [80.4 kg (177 lb 4 oz)] 80.4 kg (177 lb 4 oz) (02/06 1300) Weight change:  Last BM Date: 02/20/11  Intake/Output from previous day:  Intake/Output Summary (Last 24 hours) at 02/21/11 0918 Last data filed at 02/21/11 0842  Gross per 24 hour  Intake 3005.89 ml  Output   2400 ml  Net 605.89 ml     Physical Exam:  Gen:  NAD, mood depressed Cardiovascular:  RRR, No M/R/G Respiratory: Lungs CTAB Gastrointestinal: Abdomen soft, NT/ND with normal active bowel sounds. Extremities: No C/E/C.  Dressings to wrist lacerations changed, no signs of infection.  Wound edges well approximated.     Lab Results: Basic Metabolic Panel:  Lab 02/21/11 4332 02/20/11 1158  NA 130* 129*  K 3.4* 3.8  CL 100 98  CO2 18* 19  GLUCOSE 93 121*  BUN 15 24*  CREATININE 0.77 1.20  CALCIUM 8.6 9.2  MG -- --  PHOS -- --   GFR Estimated Creatinine Clearance: 108 ml/min (by C-G formula based on Cr of 0.77). Liver Function Tests:  Lab 02/21/11 0235 02/20/11 1158  AST 74* 26  ALT 104* 27  ALKPHOS 40 46  BILITOT 0.4 0.4  PROT 7.5 8.1  ALBUMIN 3.9 4.6   Coagulation  profile  Lab 02/21/11 0235 02/20/11 1520  INR 1.41 1.19  PROTIME -- --    CBC:  Lab 02/20/11 1158  WBC 5.8  NEUTROABS 3.8  HGB 13.1  HCT 38.1*  MCV 93.4  PLT 274    Ref. Range 02/20/2011 11:58 02/20/2011 15:20 02/20/2011 20:52 02/21/2011 02:35  Salicylate Lvl Latest Range: 2.8-20.0 mg/dL <9.5 (L)     Acetaminophen (Tylenol), Serum Latest Range: 10-30 ug/mL 75.8 (H) 37.4 (H) <15.0 <15.0    Medications: Scheduled Meds:   . acetylcysteine  150 mg/kg Intravenous Once  . gemfibrozil  600 mg Oral Daily  . hydrochlorothiazide  25 mg Oral Daily  . lisinopril  20 mg Oral Daily  . nicotine  21 mg Transdermal Daily  . ondansetron      . ondansetron (ZOFRAN) IV  4 mg Intravenous Once  . sodium chloride  3 mL Intravenous Q12H  . vitamin A & D      . DISCONTD: acetylcysteine  150 mg/kg Intravenous Once  . DISCONTD: acetylcysteine  150 mg/kg Intravenous Once  . DISCONTD: hydrochlorothiazide  25 mg Oral Daily  . DISCONTD: lisinopril  20 mg Oral Daily  . DISCONTD: lisinopril-hydrochlorothiazide  1 tablet Oral Daily  . DISCONTD: sodium chloride  3 mL Intravenous Q12H   Continuous Infusions:   . sodium chloride 100 mL/hr at 02/21/11  1610  . acetylcysteine 15 mg/kg/hr (02/20/11 1812)  . DISCONTD: acetylcysteine     PRN Meds:.sodium chloride, alum & mag hydroxide-simeth, LORazepam, ondansetron (ZOFRAN) IV, ondansetron, traMADol, DISCONTD: sodium chloride Antibiotics: Anti-infectives    None       Assessment/Plan:  Assessment & Plan:  Principal Problem:  *Acetaminophen overdose  The patient was admitted and monitored closely for acute liver toxicity. His LFTs have bumped. He has been maintained on Mucomist, and his Tylenol levels are now undetectable.  We'll re-check his liver function studies at 6 pm and again in the morning, and PT/INR in the morning. From UpToDate: Check the serum ALT and acetaminophen concentrations as the patient is approaching the end of the protocol  (approximately 18 hours after starting treatment). If the serum ALT is elevated OR if the serum acetaminophen concentration is detectable, continue treatment with acetylcysteine at 6.25 mg/kg per hour and obtain a serum acetaminophen concentration and ALT measurement every 12 hours thereafter. If the ALT is elevated, also measure the international normalized ratio (INR). Treatment can be stopped when the serum acetaminophen concentration is undetectable, the ALT is clearly decreasing or in the normal range, and the INR is less than two. There is no uniformly accepted definition of "clearly decreasing." One conservative definition is a decrease of more than 50 percent from the peak measurement or three consecutive decreasing values, all below 1000 IU/L. Active Problems:  HIV (human immunodeficiency virus infection)  Sees Dr. Ninetta Lights for this and is not currently on any anti-retroviral treatment. His white blood cell count is normal.  Hypertension  Continue the patient's usual home antihypertensives.  Hyperlipidemia  Continue the patient's Lopid.  Tobacco abuse  We requested tobacco cessation education and have ordered a nicotine patch.  Suicide attempt  Continue suicide precautions and Recruitment consultant. I have requested psychiatry consultation, which is pending.  Hyponatremia  Initially thought to be related to dehydration, but has not improved with IVF.  Will check osmolality studies. Laceration of skin of wrist  Patient is status post suturing by the emergency department physicians. Continue wound care per nursing staff.  Will need sutures out in 7-10 days. Hypokalemia Will replace with KCL40 mEq x 1 today.      LOS: 1 day   Hillery Aldo, MD Pager [phone]  02/21/2011, 9:18 AM
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
## 2011-02-22 ENCOUNTER — Other Ambulatory Visit: Payer: Self-pay

## 2011-02-22 DIAGNOSIS — X789XXA Intentional self-harm by unspecified sharp object, initial encounter: Secondary | ICD-10-CM

## 2011-02-22 DIAGNOSIS — S61509A Unspecified open wound of unspecified wrist, initial encounter: Secondary | ICD-10-CM

## 2011-02-22 DIAGNOSIS — T391X1A Poisoning by 4-Aminophenol derivatives, accidental (unintentional), initial encounter: Principal | ICD-10-CM

## 2011-02-22 DIAGNOSIS — T398X2A Poisoning by other nonopioid analgesics and antipyretics, not elsewhere classified, intentional self-harm, initial encounter: Secondary | ICD-10-CM

## 2011-02-22 DIAGNOSIS — T394X2A Poisoning by antirheumatics, not elsewhere classified, intentional self-harm, initial encounter: Secondary | ICD-10-CM

## 2011-02-22 LAB — COMPREHENSIVE METABOLIC PANEL
ALT: 243 U/L — ABNORMAL HIGH (ref 0–53)
ALT: 513 U/L — ABNORMAL HIGH (ref 0–53)
AST: 163 U/L — ABNORMAL HIGH (ref 0–37)
AST: 280 U/L — ABNORMAL HIGH (ref 0–37)
Albumin: 3.6 g/dL (ref 3.5–5.2)
Albumin: 3.6 g/dL (ref 3.5–5.2)
Alkaline Phosphatase: 42 U/L (ref 39–117)
Alkaline Phosphatase: 47 U/L (ref 39–117)
BUN: 12 mg/dL (ref 6–23)
BUN: 11 mg/dL (ref 6–23)
CO2: 19 mEq/L (ref 19–32)
CO2: 20 mEq/L (ref 19–32)
Calcium: 8.4 mg/dL (ref 8.4–10.5)
Calcium: 8.8 mg/dL (ref 8.4–10.5)
Chloride: 104 mEq/L (ref 96–112)
Chloride: 104 mEq/L (ref 96–112)
Creatinine, Ser: 0.67 mg/dL (ref 0.50–1.35)
Creatinine, Ser: 0.78 mg/dL (ref 0.50–1.35)
GFR calc Af Amer: >90 mL/min (ref >90)
GFR calc Af Amer: >90 mL/min (ref >90)
GFR calc non Af Amer: >90 mL/min (ref >90)
GFR calc non Af Amer: >90 mL/min (ref >90)
Glucose, Bld: 119 mg/dL — ABNORMAL HIGH (ref 70–99)
Glucose, Bld: 92 mg/dL (ref 70–99)
Potassium: 4 mEq/L (ref 3.5–5.1)
Potassium: 3.5 mEq/L (ref 3.5–5.1)
Sodium: 132 mEq/L — ABNORMAL LOW (ref 135–145)
Sodium: 135 mEq/L (ref 135–145)
Total Bilirubin: 0.3 mg/dL (ref 0.3–1.2)
Total Bilirubin: 0.2 mg/dL — ABNORMAL LOW (ref 0.3–1.2)
Total Protein: 6.8 g/dL (ref 6.0–8.3)
Total Protein: 7 g/dL (ref 6.0–8.3)

## 2011-02-22 LAB — CARDIAC PANEL(CRET KIN+CKTOT+MB+TROPI)
CK, MB: 3.9 ng/mL (ref 0.3–4.0)
CK, MB: 4.2 ng/mL — ABNORMAL HIGH (ref 0.3–4.0)
CK, MB: 4.1 ng/mL — ABNORMAL HIGH (ref 0.3–4.0)
Relative Index: 2.1 (ref 0.0–2.5)
Relative Index: 2.4 (ref 0.0–2.5)
Relative Index: 2.5 (ref 0.0–2.5)
Total CK: 183 U/L (ref 7–232)
Total CK: 174 U/L (ref 7–232)
Total CK: 162 U/L (ref 7–232)
Troponin I: <0.3 ng/mL (ref <0.30)
Troponin I: <0.3 ng/mL (ref <0.30)
Troponin I: <0.3 ng/mL (ref <0.30)

## 2011-02-22 LAB — CBC
HCT: 34.1 % — ABNORMAL LOW (ref 39.0–52.0)
Hemoglobin: 11.8 g/dL — ABNORMAL LOW (ref 13.0–17.0)
MCH: 32.2 pg (ref 26.0–34.0)
MCHC: 34.6 g/dL (ref 30.0–36.0)
MCV: 93.2 fL (ref 78.0–100.0)
Platelets: 200 10*3/uL (ref 150–400)
RBC: 3.66 MIL/uL — ABNORMAL LOW (ref 4.22–5.81)
RDW: 12.9 % (ref 11.5–15.5)
WBC: 8.6 10*3/uL (ref 4.0–10.5)

## 2011-02-22 LAB — PROTIME-INR
INR: 1.22 (ref 0.00–1.49)
Prothrombin Time: 15.7 seconds — ABNORMAL HIGH (ref 11.6–15.2)

## 2011-02-22 LAB — SODIUM, URINE, RANDOM: Sodium, Ur: 206 mEq/L

## 2011-02-22 LAB — OSMOLALITY: Osmolality: 273 mOsm/kg — ABNORMAL LOW (ref 275–300)

## 2011-02-22 MED ORDER — TRAZODONE HCL 150 MG PO TABS
150.0000 mg | ORAL_TABLET | Freq: Every evening | ORAL | Status: DC | PRN
  Administered 2011-02-22 – 2011-02-23 (×2): 150 mg via ORAL
  Filled 2011-02-22 (×2): qty 1

## 2011-02-22 MED ORDER — ASPIRIN EC 325 MG PO TBEC
325.0000 mg | DELAYED_RELEASE_TABLET | Freq: Every day | ORAL | Status: DC
  Administered 2011-02-22 – 2011-02-25 (×4): 325 mg via ORAL
  Filled 2011-02-22 (×6): qty 1

## 2011-02-22 MED ORDER — NITROGLYCERIN 0.4 MG SL SUBL
0.4000 mg | SUBLINGUAL_TABLET | SUBLINGUAL | Status: DC | PRN
  Administered 2011-02-22: 0.4 mg via NASAL
  Administered 2011-02-22: 0.4 mg via SUBLINGUAL

## 2011-02-22 MED ORDER — DEXTROSE 5 % IV SOLN
15.0000 mg/kg/h | INTRAVENOUS | Status: AC
  Administered 2011-02-22 – 2011-02-23 (×2): 15 mg/kg/h via INTRAVENOUS
  Filled 2011-02-22 (×3): qty 100

## 2011-02-22 MED ORDER — MENTHOL 3 MG MT LOZG
1.0000 | LOZENGE | OROMUCOSAL | Status: DC | PRN
  Filled 2011-02-22: qty 9

## 2011-02-22 MED ORDER — SENNA 8.6 MG PO TABS
1.0000 | ORAL_TABLET | Freq: Every day | ORAL | Status: DC
  Administered 2011-02-22 – 2011-02-24 (×3): 8.6 mg via ORAL
  Filled 2011-02-22 (×3): qty 1

## 2011-02-22 NOTE — Progress Notes (Signed)
Pt complains of severe chest pain, rated 8/10, sharp to left chest and shoulders.  Very anxious, tearful, arguing with sister.  Continues to verbalize wanting "to be done with everything, my life is ruined, I can't even go back to my life how it was".  Emotional support provided.  BP 164/92 HR 94.  NTG SL given x 1 with complete relief of pain.  BP now 149/81 HR 81.   Spent approx in room talking with pt.  Remains very anxious, states "I'm just not ready to go home yet, I don't have control of my mind".  Suicide sitter remains at bedside.  Close monitoring.

## 2011-02-22 NOTE — Progress Notes (Signed)
[facility] hospitalist progress note. Chief complaint. Chest pain. History of present illness. This 51 year old male admitted with Tylenol overdose undergoing treatment. There is no prior history of coronary artery disease though the patient does report taking a statin. He reported to the nurse central chest pain with radiation to the left shoulder. There was no nausea but he stated there was some diaphoresis. He was given one sublingual nitroglycerin per facility protocol and reported complete relief of his chest pain. A 12-lead EKG was done at the time the patient was experiencing pain. This does show some ST elevation in anterior leads V2 and V3 with some slight ST depression in leads 1 and 2. A second EKG was done after nitroglycerin and this shows only normal sinus rhythm. Vital signs temperature 98.1, pulse 92, respiration 18, blood pressure 158/79. O2 sats 94%. General appearance. This is a well-developed middle-aged male in no distress. Alert, cooperative, oriented. Cardiac. Rate and rhythm regular. No jugular venous distention or edema. Lungs. Breath sounds are clear and equal. Stable O2 sats. Abdomen. Soft with positive bowel sounds. No pain. Impression/plan. Problem #1 chest pain. The patient did have some EKG changes on wall experiencing pain which is concerning. I will go ahead and start him on EC aspirin 325 mg. I will will order cardiac enzymes to be cycled now and then every 8 hours for a total of 3. We'll repeat a 12-lead EKG in the a.m. Cardiac consult per discretion of rounding physician.

## 2011-02-22 NOTE — Progress Notes (Signed)
Pt expressed increased feelings of anxiety related to present situation.  Dr. Suanne Marker notified and updated of pt status.  MD to assess. Jacob Ali

## 2011-02-22 NOTE — Progress Notes (Signed)
Doctors, please advise:  Poison control recommends checking LFTs and INR at 19:00 this evening, and if LFTs are trending down and INR is <2.0, Acetylcysteine may be discontinued.
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
## 2011-02-22 NOTE — Progress Notes (Signed)
PATIENT DETAILS Name: Jacob Ali Age: 51 y.o. Sex: male Date of Birth: [birth year] Admit Date: 02/20/2011 WUJ:[id number] D, MD, MD Emergency contact: Dantavious Snowball (Mother) [phone]   Interval History: Mr. Haidar is a 51 year old male with a PMH of HIV and depression who took an overdose of Tylenol PM and cut his wrists on 02/20/11.  He is receiving mucomist.  Psychiatry consultation was requested and saw pt initially on 2/7  SUBJECTIVE: States overnight he had an anxiety attack, CP, that left him feeling desperate again and suicidal. Chart reviewed, family at bedside.    Pt c/o insomnia and requesting sleep aid.  Objective: Vital signs in last 24 hours: Temp:  [97.5 F (36.4 C)-98.4 F (36.9 C)] 98.4 F (36.9 C) (02/08 1411) Pulse Rate:  [78-101] 94  (02/08 1411) Resp:  [18-20] 18  (02/08 1411) BP: (145-175)/(71-96) 174/78 mmHg (02/08 1411) SpO2:  [94 %-97 %] 95 % (02/08 1411) Weight change:  Last BM Date: 02/19/11  Intake/Output from previous day:  Intake/Output Summary (Last 24 hours) at 02/22/11 1509 Last data filed at 02/22/11 1100  Gross per 24 hour  Intake 1462.45 ml  Output   2450 ml  Net -987.55 ml     Physical Exam:  Gen:  NAD, mood depressed Cardiovascular:  RRR, No M/R/G Respiratory: Lungs CTAB Gastrointestinal: Abdomen soft, NT/ND with normal active bowel sounds. Extremities: No C/E/C.  Dressings to wrist lacerations changed, no signs of infection.  Wound edges well approximated.     Lab Results: Basic Metabolic Panel:  Lab 02/22/11 7846 02/21/11 1846 02/21/11 0235 02/20/11 1158  NA 132* 129* 130* 129*  K 4.0 3.6 -- --  CL 104 101 100 98  CO2 19 20 18* 19  GLUCOSE 119* 127* 93 121*  BUN 12 15 15  24*  CREATININE 0.67 0.92 0.77 1.20  CALCIUM 8.4 8.4 8.6 9.2  MG -- -- -- --  PHOS -- -- -- --   GFR Estimated Creatinine Clearance: 108 ml/min (by C-G formula based on Cr of 0.67). Liver Function Tests:  Lab 02/22/11 0120 02/21/11 1846  02/21/11 0235 02/20/11 1158  AST 163* 105* 74* 26  ALT 243* 168* 104* 27  ALKPHOS 42 54 40 46  BILITOT 0.3 0.3 0.4 0.4  PROT 6.8 7.0 7.5 8.1  ALBUMIN 3.6 3.8 3.9 4.6   Coagulation profile  Lab 02/22/11 0120 02/21/11 0235 02/20/11 1520  INR 1.22 1.41 1.19  PROTIME -- -- --    CBC:  Lab 02/22/11 0120 02/20/11 1158  WBC 8.6 5.8  NEUTROABS -- 3.8  HGB 11.8* 13.1  HCT 34.1* 38.1*  MCV 93.2 93.4  PLT 200 274    Ref. Range 02/20/2011 11:58 02/20/2011 15:20 02/20/2011 20:52 02/21/2011 02:35  Salicylate Lvl Latest Range: 2.8-20.0 mg/dL <9.6 (L)     Acetaminophen (Tylenol), Serum Latest Range: 10-30 ug/mL 75.8 (H) 37.4 (H) <15.0 <15.0    Medications: Scheduled Meds:    . acetylcysteine  150 mg/kg Intravenous Once  . aspirin EC  325 mg Oral Daily  . gemfibrozil  600 mg Oral Daily  . hydrochlorothiazide  25 mg Oral Daily  . lisinopril  20 mg Oral Daily  . nicotine  21 mg Transdermal Daily  . sodium chloride  3 mL Intravenous Q12H   Continuous Infusions:    . sodium chloride 100 mL/hr at 02/22/11 0759  . acetylcysteine 15.025 mg/kg/hr (02/22/11 0700)   PRN Meds:.sodium chloride, alum & mag hydroxide-simeth, ibuprofen, LORazepam, nitroGLYCERIN, ondansetron (ZOFRAN) IV, ondansetron, oxyCODONE  Antibiotics: Anti-infectives    None       Assessment/Plan:  Assessment & Plan:  Principal Problem:  *Acetaminophen overdose  -continuing mucomyst - LFTS and INR to be rechecked this pm and if meets criteria as outlined below, mucomyst is to be dc'ed  From UpToDate: Check the serum ALT and acetaminophen concentrations as the patient is approaching the end of the protocol (approximately 18 hours after starting treatment). If the serum ALT is elevated OR if the serum acetaminophen concentration is detectable, continue treatment with acetylcysteine at 6.25 mg/kg per hour and obtain a serum acetaminophen concentration and ALT measurement every 12 hours thereafter. If the ALT is elevated, also  measure the international normalized ratio (INR). Treatment can be stopped when the serum acetaminophen concentration is undetectable, the ALT is clearly decreasing or in the normal range, and the INR is less than two. There is no uniformly accepted definition of "clearly decreasing." One conservative definition is a decrease of more than 50 percent from the peak measurement or three consecutive decreasing values, all below 1000 IU/L. Active Problems:  HIV (human immunodeficiency virus infection)  Sees Dr. Ninetta Lights for this and is not currently on any anti-retroviral treatment. His white blood cell count is normal.  Hypertension  Continue the patient's usual home antihypertensives.  Hyperlipidemia  Continue the patient's Lopid.  Tobacco abuse  On nicotine patch.  Suicide attempt  Continue suicide precautions and Recruitment consultant. -appreciate psych assistance, initially Dr Ferol Luz recommended d/c to home when cleared, but following update on overnight events she has recommended [facility] once medically cleared. - Psych recommending trazodone for insomnia - started - CEs neg x 3 and pt chest pain free today  Hyponatremia  Improved today, follow and recheck Laceration of skin of wrist  Patient is status post suturing by the emergency department physicians. Continue wound care per nursing staff.  Will need sutures out in 7-10 days. Hypokalemia resolved      LOS: 2 days   Hillery Aldo, MD Pager [phone]  02/22/2011, 3:09 PM

## 2011-02-22 NOTE — Progress Notes (Addendum)
Pt c/o severe chest pain, rated 10/10, sharp pain from left chest to mid back.  Tearful, very anxious.  BP 175/96, HR 90s.  12 lead EKG done, showing some ST elevation, a change since previous EKG.  Virgia Land NP made aware.  NTG SL given at 0001; effective.  At 0005, pt expresses complete relief of pain.  BP 145/79, HR 90s.  Pt's sister states that just prior to this incident, pt had been talking once again about "ending it all, not able to face the world".  Emotional support provided to patient..  EKG after chest pain relieved shows normal sinus rhythm.
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
## 2011-02-22 NOTE — Progress Notes (Signed)
D: CMET lab results called to T. Callahan,NP.  R: Orders were received.  Jacob Ali

## 2011-02-22 NOTE — Progress Notes (Signed)
Acetylcysteine gtt was stopped at 1615 after 23 hours.  Spoke with Regan Rakers, and Alona Bene at [employer].  Recommend gtt be restarted at previous rate and rebolused.  Aram Beecham RPh spoke with Virgia Land NP, made him aware of event.  New order received and implemented.

## 2011-02-22 NOTE — Progress Notes (Signed)
CSW spent a significant amount of time today with Pt and family.   Pt had what sounds like a severe panic attack yesterday accompanied with despair and suicidal ideations.  Pt voiced to family that he was scared to go home and that he was wanting inpt tx.  Pt voiced to CSW that he feels like the can't function in his current setting with his current state of mind.  He reports that he feels over and underwhelmed and that the gravity of the situation, as well as the uncertainty of his legal issues, are too much for him to bear.  Pt amenable to inpt tx and would like to be transferred to [facility] upon d/c.  Notified psych MD who is in agreement with inpt tx.  Notified MD.  CSW to continue to follow.  Providence Crosby, LCSWA Clinical Social Work [phone]

## 2011-02-23 LAB — COMPREHENSIVE METABOLIC PANEL
ALT: 442 U/L — ABNORMAL HIGH (ref 0–53)
AST: 186 U/L — ABNORMAL HIGH (ref 0–37)
Albumin: 3.1 g/dL — ABNORMAL LOW (ref 3.5–5.2)
Alkaline Phosphatase: 40 U/L (ref 39–117)
BUN: 13 mg/dL (ref 6–23)
CO2: 21 mEq/L (ref 19–32)
Calcium: 8.4 mg/dL (ref 8.4–10.5)
Chloride: 107 mEq/L (ref 96–112)
Creatinine, Ser: 0.78 mg/dL (ref 0.50–1.35)
GFR calc Af Amer: >90 mL/min (ref >90)
GFR calc non Af Amer: >90 mL/min (ref >90)
Glucose, Bld: 103 mg/dL — ABNORMAL HIGH (ref 70–99)
Potassium: 3.9 mEq/L (ref 3.5–5.1)
Sodium: 136 mEq/L (ref 135–145)
Total Bilirubin: 0.3 mg/dL (ref 0.3–1.2)
Total Protein: 6.3 g/dL (ref 6.0–8.3)

## 2011-02-23 LAB — PROTIME-INR
INR: 1.27 (ref 0.00–1.49)
INR: 1.2 (ref 0.00–1.49)
Prothrombin Time: 16.2 seconds — ABNORMAL HIGH (ref 11.6–15.2)
Prothrombin Time: 15.5 seconds — ABNORMAL HIGH (ref 11.6–15.2)

## 2011-02-23 LAB — ACETAMINOPHEN LEVEL: Acetaminophen (Tylenol), Serum: <15 ug/mL (ref 10–30)

## 2011-02-23 MED ORDER — VITAMINS A & D EX OINT
TOPICAL_OINTMENT | CUTANEOUS | Status: AC
  Filled 2011-02-23: qty 5

## 2011-02-23 NOTE — Progress Notes (Signed)
Subjective: NO complaints today.  Objective: Vital signs in last 24 hours: Temp:  [97.8 F (36.6 C)-98.6 F (37 C)] 97.8 F (36.6 C) (02/09 0516) Pulse Rate:  [86-101] 86  (02/09 0516) Resp:  [18-20] 18  (02/09 0516) BP: (156-174)/(78-92) 156/84 mmHg (02/09 0516) SpO2:  [95 %-97 %] 97 % (02/09 0516) Weight change:  Last BM Date: 02/22/11  Intake/Output from previous day: 02/08 0701 - 02/09 0700 In: 3022.4 [P.O.:360; I.V.:2662.4] Out: -  Intake/Output this shift: Total I/O In: 480 [P.O.:480] Out: 380 [Urine:380]  General appearance: alert and cooperative Resp: clear to auscultation bilaterally Cardio: regular rate and rhythm, S1, S2 normal, no murmur, click, rub or gallop GI: soft, non-tender; bowel sounds normal; no masses,  no organomegaly  Lab Results:  Basename 02/22/11 0120 02/20/11 1158  WBC 8.6 5.8  HGB 11.8* 13.1  HCT 34.1* 38.1*  PLT 200 274   BMET  Basename 02/23/11 0510 02/22/11 1845  NA 136 135  K 3.9 3.5  CL 107 104  CO2 21 20  GLUCOSE 103* 92  BUN 13 11  CREATININE 0.78 0.78  CALCIUM 8.4 8.8    Studies/Results: No results found.  Medications: I have reviewed the patient's current medications.  Assessment/Plan: Principal Problem:  *Acetaminophen overdose  Completing mucomyst, lfts improving Active Problems:  HIV (human immunodeficiency virus infection)  Hypertension follow on meds  Suicide attempt  Hyponatremia resolved  Laceration of skin of wrist sutured  Hypokalemia resolved  Disposition  Transfer to [facility] likely 2/11   LOS: 3 days   Colby Reels JOSEPH 02/23/2011, 10:24 AM

## 2011-02-24 ENCOUNTER — Encounter (HOSPITAL_COMMUNITY): Payer: Self-pay | Admitting: *Deleted

## 2011-02-24 LAB — COMPREHENSIVE METABOLIC PANEL
ALT: 325 U/L — ABNORMAL HIGH (ref 0–53)
AST: 82 U/L — ABNORMAL HIGH (ref 0–37)
Albumin: 3.2 g/dL — ABNORMAL LOW (ref 3.5–5.2)
Alkaline Phosphatase: 41 U/L (ref 39–117)
BUN: 9 mg/dL (ref 6–23)
CO2: 23 mEq/L (ref 19–32)
Calcium: 8.5 mg/dL (ref 8.4–10.5)
Chloride: 107 mEq/L (ref 96–112)
Creatinine, Ser: 0.8 mg/dL (ref 0.50–1.35)
GFR calc Af Amer: >90 mL/min (ref >90)
GFR calc non Af Amer: >90 mL/min (ref >90)
Glucose, Bld: 120 mg/dL — ABNORMAL HIGH (ref 70–99)
Potassium: 4 mEq/L (ref 3.5–5.1)
Sodium: 139 mEq/L (ref 135–145)
Total Bilirubin: 0.2 mg/dL — ABNORMAL LOW (ref 0.3–1.2)
Total Protein: 6.4 g/dL (ref 6.0–8.3)

## 2011-02-24 MED ORDER — METOPROLOL TARTRATE 25 MG PO TABS
25.0000 mg | ORAL_TABLET | Freq: Two times a day (BID) | ORAL | Status: DC
  Administered 2011-02-24 – 2011-02-25 (×2): 25 mg via ORAL
  Filled 2011-02-24 (×5): qty 1
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
## 2011-02-24 NOTE — Progress Notes (Signed)
Pt  medically cleared per RN, Tess. CSW called in referral to [facility], awaiting call back re: acceptance & bed availability.  Unice Bailey, LCSWA [phone]

## 2011-02-24 NOTE — Progress Notes (Signed)
CSW heard back from Howard (#: [phone]) [facility] - they stated they are not able to take patient today, that we need to get a better control of his tachycardia before they can admit him. RN, Tess made aware.   Unice Bailey, LCSWA [phone]

## 2011-02-24 NOTE — Progress Notes (Signed)
Subjective: Feels much better  Objective: Vital signs in last 24 hours: Temp:  [97.9 F (36.6 C)-98.1 F (36.7 C)] 98 F (36.7 C) (02/10 0526) Pulse Rate:  [95-107] 104  (02/10 0526) Resp:  [18-20] 18  (02/10 0526) BP: (160-164)/(81-94) 164/94 mmHg (02/10 0526) SpO2:  [97 %-100 %] 98 % (02/10 0526) Weight change:  Last BM Date: 02/23/11  Intake/Output from previous day: 02/09 0701 - 02/10 0700 In: 1880 [P.O.:1880] Out: 4360 [Urine:4360] Intake/Output this shift: Total I/O In: 480 [P.O.:480] Out: 350 [Urine:350]  General appearance: alert and cooperative  Lab Results:  Basename 02/22/11 0120  WBC 8.6  HGB 11.8*  HCT 34.1*  PLT 200   BMET  Basename 02/24/11 0525 02/23/11 0510  NA 139 136  K 4.0 3.9  CL 107 107  CO2 23 21  GLUCOSE 120* 103*  BUN 9 13  CREATININE 0.80 0.78  CALCIUM 8.5 8.4    Studies/Results: No results found.  Medications: I have reviewed the patient's current medications.  Assessment/Plan: Principal Problem:  *Acetaminophen overdose  LFTs improving Active Problems:  HIV (human immunodeficiency virus infection)  Hypertension  Borderline control  Suicide attempt transfer to inpt psych unit  Laceration of skin of wrist remove sutrues end of week   LOS: 4 days   Jacob Ali JOSEPH 02/24/2011, 10:27 AM

## 2011-02-25 ENCOUNTER — Inpatient Hospital Stay (HOSPITAL_COMMUNITY)
Admission: RE | Admit: 2011-02-25 | Discharge: 2011-02-27 | DRG: 425 | Disposition: A | Discharge status: Home / Self Care | Payer: BC Managed Care – PPO | Source: Ambulatory Visit | Attending: Psychiatry | Admitting: Psychiatry

## 2011-02-25 ENCOUNTER — Encounter (HOSPITAL_COMMUNITY): Payer: Self-pay | Admitting: *Deleted

## 2011-02-25 DIAGNOSIS — Z79899 Other long term (current) drug therapy: Secondary | ICD-10-CM

## 2011-02-25 DIAGNOSIS — I10 Essential (primary) hypertension: Secondary | ICD-10-CM

## 2011-02-25 DIAGNOSIS — X838XXA Intentional self-harm by other specified means, initial encounter: Secondary | ICD-10-CM

## 2011-02-25 DIAGNOSIS — T50902A Poisoning by unspecified drugs, medicaments and biological substances, intentional self-harm, initial encounter: Secondary | ICD-10-CM

## 2011-02-25 DIAGNOSIS — Z21 Asymptomatic human immunodeficiency virus [HIV] infection status: Secondary | ICD-10-CM

## 2011-02-25 DIAGNOSIS — E785 Hyperlipidemia, unspecified: Secondary | ICD-10-CM

## 2011-02-25 DIAGNOSIS — S61509A Unspecified open wound of unspecified wrist, initial encounter: Secondary | ICD-10-CM

## 2011-02-25 DIAGNOSIS — F411 Generalized anxiety disorder: Principal | ICD-10-CM | POA: Diagnosis present

## 2011-02-25 DIAGNOSIS — T50901A Poisoning by unspecified drugs, medicaments and biological substances, accidental (unintentional), initial encounter: Secondary | ICD-10-CM

## 2011-02-25 MED ORDER — METOPROLOL TARTRATE 25 MG PO TABS
25.0000 mg | ORAL_TABLET | Freq: Two times a day (BID) | ORAL | Status: DC
  Administered 2011-02-25 – 2011-02-27 (×4): 25 mg via ORAL
  Filled 2011-02-25 (×5): qty 1

## 2011-02-25 MED ORDER — HYDROXYZINE HCL 25 MG PO TABS
25.0000 mg | ORAL_TABLET | Freq: Three times a day (TID) | ORAL | Status: DC | PRN
  Administered 2011-02-25: 25 mg via ORAL

## 2011-02-25 MED ORDER — TRAZODONE HCL 150 MG PO TABS: 150.0000 mg | ORAL_TABLET | Freq: Every evening | ORAL | Status: DC | PRN

## 2011-02-25 MED ORDER — ACETAMINOPHEN 325 MG PO TABS: 650.0000 mg | ORAL_TABLET | Freq: Four times a day (QID) | ORAL | Status: DC | PRN

## 2011-02-25 MED ORDER — MAGNESIUM HYDROXIDE 400 MG/5ML PO SUSP: 30.0000 mL | Freq: Every day | ORAL | Status: DC | PRN

## 2011-02-25 MED ORDER — NICOTINE 21 MG/24HR TD PT24
21.0000 mg | MEDICATED_PATCH | Freq: Every day | TRANSDERMAL | Status: DC
  Administered 2011-02-26 – 2011-02-27 (×2): 21 mg via TRANSDERMAL
  Filled 2011-02-25 (×3): qty 1

## 2011-02-25 MED ORDER — METOPROLOL TARTRATE 25 MG PO TABS: 25.0000 mg | ORAL_TABLET | Freq: Two times a day (BID) | ORAL | Status: DC

## 2011-02-25 MED ORDER — HYDROCHLOROTHIAZIDE 25 MG PO TABS
12.5000 mg | ORAL_TABLET | Freq: Every day | ORAL | Status: DC
  Filled 2011-02-25: qty 0.5

## 2011-02-25 MED ORDER — LISINOPRIL 20 MG PO TABS
20.0000 mg | ORAL_TABLET | Freq: Every day | ORAL | Status: DC
  Administered 2011-02-26 – 2011-02-27 (×2): 20 mg via ORAL
  Filled 2011-02-25 (×3): qty 1

## 2011-02-25 MED ORDER — ALUM & MAG HYDROXIDE-SIMETH 200-200-20 MG/5ML PO SUSP: 30.0000 mL | ORAL | Status: DC | PRN

## 2011-02-25 MED ORDER — GEMFIBROZIL 600 MG PO TABS
600.0000 mg | ORAL_TABLET | Freq: Every morning | ORAL | Status: DC
  Administered 2011-02-26 – 2011-02-27 (×2): 600 mg via ORAL
  Filled 2011-02-25 (×3): qty 1

## 2011-02-25 MED ORDER — TRAZODONE HCL 50 MG PO TABS
150.0000 mg | ORAL_TABLET | Freq: Every evening | ORAL | Status: DC | PRN
  Administered 2011-02-25 – 2011-02-26 (×2): 150 mg via ORAL
  Filled 2011-02-25 (×2): qty 1

## 2011-02-25 NOTE — Tx Team (Signed)
Initial Interdisciplinary Treatment Plan  PATIENT STRENGTHS: (choose at least two) Ability for insight Average or above average intelligence Capable of independent living Communication skills General fund of knowledge Supportive family/friends  PATIENT STRESSORS: Health problems Legal issue   PROBLEM LIST: Problem List/Patient Goals Date to be addressed Date deferred Reason deferred Estimated date of resolution  Suicidal Attempt  02-25-11     Depression 02-25-11     HIV 02-25-11     HTN 02-25-11     Hyperlipedimia 02-25-11                              DISCHARGE CRITERIA:  Ability to meet basic life and health needs Improved stabilization in mood, thinking, and/or behavior Medical problems require only outpatient monitoring Motivation to continue treatment in a less acute level of care Need for constant or close observation no longer present  PRELIMINARY DISCHARGE PLAN: Outpatient therapy Participate in family therapy Return to previous living arrangement  PATIENT/FAMIILY INVOLVEMENT: This treatment plan has been presented to and reviewed with the patient, Jacob Ali, and/or family member.  The patient and family have been given the opportunity to ask questions and make suggestions.  Jacob Ali 02/25/2011, 8:17 PM

## 2011-02-25 NOTE — Progress Notes (Signed)
Report called to [name] at [facility]. [name]

## 2011-02-25 NOTE — Progress Notes (Signed)
Pt has been accepted at [facility].  Notified RN and Pt.  Faxed signed consents to [facility].  Pt to d/c.  Providence Crosby, LCSWA Clinical Social Work [phone]
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
## 2011-02-25 NOTE — Progress Notes (Signed)
I agree with [name], NP's note.  Jacob Ali

## 2011-02-25 NOTE — Progress Notes (Signed)
Subjective: Patient is doing well, no complaint of nausea vomiting, no bleeding no jaundice. Tolerated p.o. Intake. He is looking forward to going to behavioral health  Objective: Vital signs in last 24 hours: Temp:  [97.7 F (36.5 C)-99 F (37.2 C)] 97.7 F (36.5 C) (02/11 0546) Pulse Rate:  [96-123] 96  (02/11 0546) Resp:  [18-20] 18  (02/11 0546) BP: (109-167)/(77-92) 109/79 mmHg (02/11 0546) SpO2:  [96 %-97 %] 96 % (02/10 2109) Weight change:  Last BM Date: 02/25/11  Intake/Output from previous day: 02/10 0701 - 02/11 0700 In: 1920 [P.O.:1920] Out: 1925 [Urine:1925] Intake/Output this shift: Total I/O In: 360 [P.O.:360] Out: -   General appearance: alert, cooperative and appears stated age Resp: clear to auscultation bilaterally Cardio: regular rate and rhythm, S1, S2 normal, no murmur, click, rub or gallop Extremities: wrist laceration leftparallel to his arm, right perpendicular tohis arm. Wound is well approximated, no signs of infection  Lab Results: No results found for this basename: WBC:2,HGB:2,HCT:2,PLT:2 in the last 72 hours BMET  [facility] 02/24/11 0525 02/23/11 0510  NA 139 136  K 4.0 3.9  CL 107 107  CO2 23 21  GLUCOSE 120* 103*  BUN 9 13  CREATININE 0.80 0.78  CALCIUM 8.5 8.4     Comprehensive metabolic panel [[id number]] Abnormal Collection: 02/24/11 0525 Resulted: 02/24/11 0614 Specimen Type: Blood Sodium 139 mEq/L Potassium 4.0 mEq/L Chloride 107 mEq/L CO2 23 mEq/L Glucose, Bld 120 mg/dL H BUN 9 mg/dL Creatinine, Ser 5.40 mg/dL Calcium 8.5 mg/dL Total Protein 6.4 g/dL Albumin 3.2 g/dL L AST 82 U/L H ALT 981 U/L H Alkaline Phosphatase 41 U/L Total Bilirubin 0.2 mg/dL L GFR calc non Af Amer >90 mL/min GFR calc Af Amer      Studies/Results: No results found.  Medications:  Prior to Admission:  Prescriptions prior to admission  Medication Sig Dispense Refill  . diphenhydramine-acetaminophen (TYLENOL PM) 25-500 MG TABS Take 1-2 tablets by mouth  at bedtime as needed.      Marland Kitchen gemfibrozil (LOPID) 600 MG tablet Take 600 mg by mouth daily.       Marland Kitchen lisinopril-hydrochlorothiazide (PRINZIDE,ZESTORETIC) 20-12.5 MG per tablet Take 1 tablet by mouth daily.       Scheduled:   . aspirin EC  325 mg Oral Daily  . gemfibrozil  600 mg Oral Daily  . hydrochlorothiazide  25 mg Oral Daily  . lisinopril  20 mg Oral Daily  . metoprolol tartrate  25 mg Oral BID  . nicotine  21 mg Transdermal Daily  . senna  1 tablet Oral QHS   Continuous:   Assessment/Plan: Acetaminophen toxicity, status post treatment with Mucomyst, LFTs trending down, I have are normal. Patient tolerated p.o. Intake. Suicide attempt for plans to transfer to behavioral Health Center. Patient felt to be medically stable Tachycardia resolved Wrist laceration status post suturing in the emergency room, sutures to be removed 7-10 days, please note they were placed on February 6 Hypertension controlled HIV not on medication currently, he is followed by infectious disease Tobacco use Resolved  hyponatremia Elevated LFTs improving     LOS: 5 days   Turner Kunzman D 02/25/2011, 12:56 PM

## 2011-02-25 NOTE — Discharge Summary (Signed)
Physician Discharge Summary  Patient ID: Jacob Ali MRN: [id number] DOB/AGE: Feb 20, 1960 51 y.o.  Admit date: 02/20/2011 Discharge date: 02/25/2011  Admission Diagnoses:  Discharge Diagnoses:  Acetaminophen toxicity, status post treatment with Mucomyst, LFTs trending down, PT/INR normal Patient tolerated p.o. Intake.  Suicide attempt for plans to transfer to behavioral Health Center. Patient felt to be medically stable  Tachycardia resolved  Wrist laceration status post suturing in the emergency room, sutures to be removed 7-10 days, please note they were placed on February 6  Hypertension controlled  HIV not on medication currently, he is followed by infectious disease  Tobacco use  Resolved hyponatremia  Elevated LFTs improving      Discharged Condition: stable         Hospital Course: Patient presented to the hospital after a suicide attempt by slashing his wrist and ingestant acetaminophen. Patient's wrist was sutured in the emergency room. Patient was admitted to the hospital and started on Mucomyst via protocol. Patient had serial checks on LFTs and diet,. Patient's LFTs did trend up however 2 days consecutively they trended down. Patient's Mucomyst.. Patient was seen in consultation by psychiatry, inpatient psychiatric evaluation recommended. Patient has been medically stable 48 hours and at this time is cleared for discharge.     Consults: psychiatry  Significant Diagnostic Studies:   Procedure Component Value Units Flag Date/Time Comprehensive metabolic panel [[id number]] Abnormal Collection: 02/24/11 0525 Resulted: 02/24/11 0614 Specimen Type: Blood Sodium 139 mEq/L Potassium 4.0 mEq/L Chloride 107 mEq/L CO2 23 mEq/L Glucose, Bld 120 mg/dL H BUN 9 mg/dL Creatinine, Ser 4.78 mg/dL Calcium 8.5 mg/dL Total Protein 6.4 g/dL Albumin 3.2 g/dL L AST 82 U/L H ALT 295 U/L H Alkaline Phosphatase 41 U/L Total Bilirubin 0.2 mg/dL L GFR calc non Af Amer >90 mL/min GFR calc Af  Amer >90 mL/min Comprehensive metabolic panel [[id number]] Abnormal Collection: 02/23/11 0510 Resulted: 02/23/11 0603 Specimen Type: Blood Sodium 136 mEq/L Potassium 3.9 mEq/L Chloride 107 mEq/L CO2 21 mEq/L Glucose, Bld 103 mg/dL H BUN 13 mg/dL Creatinine, Ser 7.84 mg/dL Calcium 8.4 mg/dL Total Protein 6.3 g/dL Albumin 3.1 g/dL L AST 696 U/L H ALT 295 U/L H Alkaline Phosphatase 40 U/L Total Bilirubin 0.3 mg/dL GFR calc non Af Amer >28 mL/min GFR calc Af Amer >90 mL/min Acetaminophen level [[id number]] Collection: 02/23/11 0510 Resulted: 02/23/11 0603 Specimen Type: Blood Acetaminophen (Tylenol), Serum <15.0 ug/mL Protime-INR [[id number]] Abnormal Collection: 02/23/11 0510 Resulted: 02/23/11 0532 Specimen Type: Blood Prothrombin Time 15.5 seconds H INR 1.20 Protime-INR [[id number]] Abnormal Collection: 02/23/11 0040 Resulted: 02/23/11 0118 Specimen Type: Blood Prothrombin Time 16.2 seconds H INR 1.27 Comprehensive metabolic panel [[id number]] Abnormal Collection: 02/22/11 1845 Resulted: 02/22/11 1928 Specimen Type: Blood Sodium 135 mEq/L Potassium 3.5 mEq/L Chloride 104 mEq/L CO2 20 mEq/L Glucose, Bld 92 mg/dL BUN 11 mg/dL Creatinine, Ser 6.43 mg/dL Calcium 8.8 mg/dL Total Protein 7.0 g/dL Albumin 3.6 g/dL AST 329 U/L H ALT 518 U/L H Alkaline Phosphatase 47 U/L Total Bilirubin 0.2 mg/dL L GFR calc non Af Amer >90 mL/min GFR calc Af Amer >90 mL/min   Urine drug screen negative                          Results Acetaminophen level (Order [id number])         Acetaminophen level Status: Final result MyChart: Not Released         Range  2d ago (02/23/11)  4d ago (02/21/11)  4d ago (02/21/11)  4d ago (  02/21/11)  4d ago (02/21/11)  5d ago (02/20/11)  5d ago (02/20/11)     Acetaminophen (Tylenol), Serum  10 - 30 ug/mL  <15.0   <15.0CM   <15.0CM   <15.0CM   <15.0CM   <15.0CM   37.4 (H)CM     Comments:       THERAPEUTIC CONCENTRATIONS VARY SIGNIFICANTLY. A RANGE OF 10-30 ug/mL MAY BE AN  EFFECTIVE CONCENTRATION FOR MANY PATIENTS. HOWEVER, SOME ARE BEST TREATED AT CONCENTRATIONS OUTSIDE THIS RANGE. ACETAMINOPHEN CONCENTRATIONS >150 ug/mL AT 4 HOURS AFTER INGESTION AND >50 ug/mL AT 12 HOURS AFTER INGESTION ARE OFTEN ASSOCIATED WITH TOXIC REACTIONS.      Resulting Agency                Discharge Exam: Blood pressure 147/72, pulse 97, temperature 98.4 F (36.9 C), temperature source Oral, resp. rate 18, height 5\' 9"  (1.753 m), weight 80.4 kg (177 lb 4 oz), SpO2 97.00%.   General appearance: alert, cooperative and appears stated age  Resp: clear to auscultation bilaterally  Cardio: regular rate and rhythm, S1, S2 normal, no murmur, click, rub or gallop  Extremities: wrist laceration leftparallel to his arm, right perpendicular tohis arm. Wound is well approximated, no signs of infection   Disposition: Final discharge disposition not confirmed  Discharge Orders    Future Orders Please Complete By Expires   Diet - low sodium heart healthy      Increase activity slowly        Medication List  As of 02/25/2011  5:14 PM   STOP taking these medications         diphenhydramine-acetaminophen 25-500 MG Tabs         TAKE these medications         gemfibrozil 600 MG tablet   Commonly known as: LOPID   Take 600 mg by mouth daily.      lisinopril-hydrochlorothiazide 20-12.5 MG per tablet   Commonly known as: PRINZIDE,ZESTORETIC   Take 1 tablet by mouth daily.      metoprolol tartrate 25 MG tablet   Commonly known as: LOPRESSOR   Take 1 tablet (25 mg total) by mouth 2 (two) times daily.      traZODone 150 MG tablet   Commonly known as: DESYREL   Take 1 tablet (150 mg total) by mouth at bedtime as needed for sleep.           Follow-up Information    Follow up with Edwina Grossberg D, MD in 2 weeks.         SignedRenford Dills D 02/25/2011, 5:14 PM

## 2011-02-26 DIAGNOSIS — F411 Generalized anxiety disorder: Principal | ICD-10-CM | POA: Diagnosis present

## 2011-02-26 MED ORDER — HYDROCHLOROTHIAZIDE 12.5 MG PO CAPS
12.5000 mg | ORAL_CAPSULE | Freq: Every day | ORAL | Status: DC
  Administered 2011-02-26 – 2011-02-27 (×2): 12.5 mg via ORAL
  Filled 2011-02-26 (×3): qty 1
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
## 2011-02-26 NOTE — H&P (Signed)
Medical/psychiatric screening examination/treatment/procedure(s) were performed by non-physician practitioner and as supervising physician I was immediately available for consultation/collaboration.   I have seen and examined this patient and agree with this evaluation.  
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
## 2011-02-26 NOTE — Progress Notes (Signed)
Patient walked to the window for his medication. He reports doing well, mood and affect somewhat anxious and sad. His blood pressure elevated. Pt reports history of hypertension but controlled. He states that his elevated b/p has to do with this environment; "i have never been in an environment like this before". He is very cooperative. Metoprolol scheduled for 1700 given as ordered. Pt received med and tolerates med. Q 15 minute check continues as ordered to maintain safety.

## 2011-02-26 NOTE — Progress Notes (Signed)
[facility] Group Notes: (Counselor/Nursing/MHT/Case Management/Adjunct) 02/26/2011   @  11:00am   Type of Therapy:  Group Therapy  Participation Level:  Active  Participation Quality:  Attentive, Appropriate, Sharing  Affect:  Appropriate  Cognitive:  Appropriate  Insight:  Good  Engagement in Group:  Good  Engagement in Therapy:  Good  Modes of Intervention:  Support and Exploration  Summary of Progress/Problems: [name] stated that he already expected what his diagnosis was before he received it, so it was just confirmation. He talked about getting caught up in anxiety over what may happen in the future, and related well to the concept of staying in the present moment to keep from doing so. [name] processed how he can have goals without getting overwhelmed with them, and liked the idea of chunking them so that he can focus on what he's doing today to move toward his goal. He stated that he liked that this allows him to change directions as needed when he finds an obstacle to his goal.   [name] 02/26/2011 12:31 PM
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
## 2011-02-26 NOTE — Progress Notes (Signed)
Patient ID: Jacob Ali, male   DOB: December 20, 1960, 51 y.o.   MRN: [id number] Patient polite and appropriate this evening. States that he plans to discharge tomorrow. Says sister plans to let patient stay with her after discharge for a couple days after leaving [facility]. Verbalizes no concerns or complaints this evening. Patient states that he plans to look into counseling after discharge.

## 2011-02-26 NOTE — Tx Team (Addendum)
Interdisciplinary Treatment Plan Update (Adult)  Date:  02/26/2011  Time Reviewed:  10:50 AM   Progress in Treatment: Attending groups: Yes Participating in groups:  Yes, open with reason for admission Taking medication as prescribed:  Yes Tolerating medication: Yes Family/Significant othe contact made:  No, requesting consent to contact support person Patient understands diagnosis: Yes Discussing patient identified problems/goals with staff:  Yes Medical problems stabilized or resolved: Yes Denies suicidal/homicidal ideation: Yes Issues/concerns per patient self-inventory:  No  Other:  New problem(s) identified: None  Reason for Continuation of Hospitalization: Anxiety Depression Medication stabilization  Interventions implemented related to continuation of hospitalization:  Medication stabilization, safety checks q 15 mins, group attendance  Additional comments:  Estimated length of stay: 3-5 days  Discharge Plan: Discharge home, follow up with providers in [facility]):  Review of initial/current patient goals per problem list:   1.  Goal(s): Decrease depressive sypmtoms  Met:  No  Target date: by discharge   As evidenced by: Rate depression at 4 or lower  2.  Goal (s): Reduce potential for self-harm  Met:  Yes  Target date: by discharge  As evidenced by: [name] reports no suicidal thoughts  3.  Goal(s): Medication stabilization  Met:  No  Target date: by discharge  As evidenced by: [name] will report decrease in symptoms and no intolerable side effects    Attendees: Patient:  Jacob Ali 02/26/2011 10:50 AM  Family:     Physician:  Dr Orson Aloe, MD 02/26/2011 10:50 AM  Nursing:   Izola Price, RN 02/26/2011 10:50 AM  Case Manager:  Juline Patch, LCSW 02/26/2011 10:50 AM  Counselor:  Angus Palms, LCSW 02/26/2011 10:50 AM  Other:  Reyes Ivan, LCSWA 02/26/2011 10:50 AM  Other:  Serena Colonel, NP 02/26/2011 10:50 AM  Other:   Michele Rockers, Director of Nursing 02/26/2011 10:50 AM  Other:       Scribe for Treatment Team:   [name], 02/26/2011 10:50 AM

## 2011-02-26 NOTE — Progress Notes (Signed)
Patient has been up and in the milieu today.  Has also been attending groups.  Compliant with medication regime.  Attended team meeting this morning and it was decided that he would likely be discharged tomorrow.  He presented himself well in treatment team and agrees to the plan.  Verbalizes understanding of his medications.  Pleasant and cooperative.

## 2011-02-26 NOTE — Tx Team (Signed)
Interdisciplinary Treatment Plan Update (Adult)  Date:  02/26/2011  Time Reviewed:  11:30 AM   Progress in Treatment: Attending groups:   Yes   Participating in groups:  Yes Taking medication as prescribed:  Yes Tolerating medication:  Yes Family/Significant othe contact made: Counselor to consult with patient for collateral information Patient understands diagnosis:  Yes Discussing patient identified problems/goals with staff: Yes Medical problems stabilized or resolved: Yes Denies suicidal/homicidal ideation:Yes Issues/concerns per patient self-inventory: None identified Other:  New problem(s) identified:  Reason for Continuation of Hospitalization: Anxiety Depression Medication stabilization  Interventions implemented related to continuation of hospitalization:  Medication Management; safety checks q 15 mins  Additional comments:  Estimated length of stay:  Discharge Plan: Home with outpatient follow up  New goal(s):  Review of initial/current patient goals per problem list:    1.  Goal(s):  Eliminate SI or thoughts of self harm  Met:  Yes  Target date:  d/c  As evidenced by:  Patient is currently denying SI/self harm  2.  Goal (s):  Reduce depression  Met:  Yes  Target date: d/c  As evidenced by: Rating depression today at one; rated at ten on hospital admission  3.  Goal(s):  Stabilize on medications  Met:  No  Target date: d/c  As evidenced by:  [name] will report being stabilized on medication   4.  Goal(s):  Refer for outpatient follow up  Met:  No  Target date: d/c  As evidenced by:  Follow up appointments will be in place   Attendees: Patient:  Jacob Ali 02/26/2011 11:31 AM  Family:     Physician:  Orson Aloe, MD 02/26/2011 11:30 AM   Nursing:   Mariea Stable, RN 02/26/2011 11:30 AM   CaseManager:  Juline Patch, LCSW 02/26/2011 11:30 AM   Counselor:  Angus Palms, LCSW 02/26/2011 11:30 AM   Other:  Consuello Bossier, NP 02/26/2011  11:30 AM   Other:   02/26/2011  11:30 AM   Other:     Other:      Scribe for Treatment Team:   Wynn Banker, LCSW,  02/26/2011 11:30 AM

## 2011-02-26 NOTE — [facility] Suicide Risk Assessment (Signed)
Suicide Risk Assessment  Admission Assessment     Demographic factors:  Assessment Details Time of Assessment: Admission Current Mental Status:  Current Mental Status: Suicidal ideation indicated by patient Loss Factors:  Loss Factors: Decline in physical health;Legal issues Historical Factors:  Historical Factors: Impulsivity Risk Reduction Factors:  Risk Reduction Factors: Sense of responsibility to family;Positive social support  CLINICAL FACTORS:   Severe Anxiety and/or Agitation Depression:   Anhedonia Hopelessness Previous Psychiatric Diagnoses and Treatments  COGNITIVE FEATURES THAT CONTRIBUTE TO RISK:  Thought constriction (tunnel vision)    SUICIDE RISK:   Minimal: No identifiable suicidal ideation.  Patients presenting with no risk factors but with morbid ruminations; may be classified as minimal risk based on the severity of the depressive symptoms  Reason for hospitalization: .Suicide attempt on Tylenol  Diagnosis:  Axis I: Dysthymic Disorder  ADL's:  Intact  Sleep: Fair  Appetite:  Good  Suicidal Ideation:  Denies adamantly any suicidal thoughts. Homicidal Ideation:  Denies adamantly any homicidal thoughts.  Mental Status Examination/Evaluation: Objective:  Appearance: Casual  Eye Contact::  Good  Speech:  Clear and Coherent  Volume:  Normal  Mood:  Anxious  Affect:  Congruent  Thought Process:  Coherent  Orientation:  Full  Thought Content:  WDL  Suicidal Thoughts:  No  Homicidal Thoughts:  No  Memory:  Immediate;   Good  Judgement:  Fair  Insight:  Fair  Psychomotor Activity:  Normal  Concentration:  Good  Recall:  Good  Akathisia:  No  AIMS (if indicated):     Assets:  Communication Skills Desire for Improvement Financial Resources/Insurance Housing Intimacy Physical Health Social Support Talents/Skills  Sleep:  Number of Hours: 6.25     Vital Signs: Blood pressure 155/81, pulse 102, temperature 98.1 F (36.7 C), temperature source  Oral, resp. rate 20, height 5\' 9"  (1.753 m), weight 78.926 kg (174 lb). Current Medications:  Current Facility-Administered Medications  Medication Dose Route Frequency Provider Last Rate Last Dose  . acetaminophen (TYLENOL) tablet 650 mg  650 mg Oral Q6H PRN Mickeal Skinner, MD      . alum & mag hydroxide-simeth (MAALOX/MYLANTA) 200-200-20 MG/5ML suspension 30 mL  30 mL Oral Q4H PRN Mickeal Skinner, MD      . gemfibrozil (LOPID) tablet 600 mg  600 mg Oral q morning - 10a Mickeal Skinner, MD   600 mg at 02/26/11 1246  . hydrochlorothiazide (MICROZIDE) capsule 12.5 mg  12.5 mg Oral Daily Orson Aloe, MD   12.5 mg at 02/26/11 0806  . hydrOXYzine (ATARAX/VISTARIL) tablet 25 mg  25 mg Oral TID PRN Mickeal Skinner, MD   25 mg at 02/25/11 2231  . lisinopril (PRINIVIL,ZESTRIL) tablet 20 mg  20 mg Oral Daily Mickeal Skinner, MD   20 mg at 02/26/11 0806  . magnesium hydroxide (MILK OF MAGNESIA) suspension 30 mL  30 mL Oral Daily PRN Mickeal Skinner, MD      . metoprolol tartrate (LOPRESSOR) tablet 25 mg  25 mg Oral BID Mickeal Skinner, MD   25 mg at 02/26/11 1702  . nicotine (NICODERM CQ - dosed in mg/24 hours) patch 21 mg  21 mg Transdermal Q0600 Mickeal Skinner, MD   21 mg at 02/26/11 0806  . traZODone (DESYREL) tablet 150 mg  150 mg Oral QHS PRN Mickeal Skinner, MD   150 mg at 02/25/11 2144  . DISCONTD: hydrochlorothiazide (HYDRODIURIL) tablet 12.5 mg  12.5 mg Oral Daily Mickeal Skinner, MD       Facility-Administered Medications Ordered in Other  Encounters  Medication Dose Route Frequency Provider Last Rate Last Dose  . DISCONTD: alum & mag hydroxide-simeth (MAALOX/MYLANTA) 200-200-20 MG/5ML suspension 30 mL  30 mL Oral Q6H PRN Hillery Aldo, MD   30 mL at 02/20/11 2152  . DISCONTD: aspirin EC tablet 325 mg  325 mg Oral Daily Rolan Lipa, NP   325 mg at 02/25/11 1055  . DISCONTD: gemfibrozil (LOPID) tablet 600 mg  600 mg Oral Daily Hillery Aldo, MD   600 mg at 02/25/11 1055  . DISCONTD:  hydrochlorothiazide (HYDRODIURIL) tablet 25 mg  25 mg Oral Daily Adeline C Viyuoh, MD   25 mg at 02/25/11 1055  . DISCONTD: ibuprofen (ADVIL,MOTRIN) tablet 400 mg  400 mg Oral Q6H PRN Hillery Aldo, MD      . DISCONTD: lisinopril (PRINIVIL,ZESTRIL) tablet 20 mg  20 mg Oral Daily Adeline C Viyuoh, MD   20 mg at 02/25/11 1055  . DISCONTD: LORazepam (ATIVAN) tablet 0.5 mg  0.5 mg Oral Q6H PRN Hillery Aldo, MD   0.5 mg at 02/24/11 2124  . DISCONTD: menthol-cetylpyridinium (CEPACOL) lozenge 3 mg  1 lozenge Oral PRN Rolan Lipa, NP      . DISCONTD: metoprolol tartrate (LOPRESSOR) tablet 25 mg  25 mg Oral BID Lillia Mountain, MD   25 mg at 02/25/11 1055  . DISCONTD: nicotine (NICODERM CQ - dosed in mg/24 hours) patch 21 mg  21 mg Transdermal Daily Hillery Aldo, MD   21 mg at 02/25/11 1103  . DISCONTD: nitroGLYCERIN (NITROSTAT) SL tablet 0.4 mg  0.4 mg Sublingual Q5 Min x 3 PRN Rolan Lipa, NP   0.4 mg at 02/22/11 0129  . DISCONTD: ondansetron (ZOFRAN) injection 4 mg  4 mg Intravenous Q6H PRN Hillery Aldo, MD   4 mg at 02/21/11 0412  . DISCONTD: ondansetron (ZOFRAN) tablet 4 mg  4 mg Oral Q6H PRN Hillery Aldo, MD      . DISCONTD: oxyCODONE (Oxy IR/ROXICODONE) immediate release tablet 5 mg  5 mg Oral Q4H PRN Hillery Aldo, MD   5 mg at 02/24/11 2224  . DISCONTD: senna (SENOKOT) tablet 8.6 mg  1 tablet Oral QHS Kela Millin, MD   8.6 mg at 02/24/11 2124  . DISCONTD: traZODone (DESYREL) tablet 150 mg  150 mg Oral QHS PRN Kela Millin, MD   150 mg at 02/23/11 2223    Lab Results: No results found for this or any previous visit (from the past 48 hour(s)). Physical Findings: AIMS:   CIWA:   CIWA-Ar Total: 0  COWS:      Treatment Plan Summary: Daily contact with patient to assess and evaluate symptoms and progress in treatment Medication management   Risk of harm to self is elevated by his history of this recent suicide attempt.  Risk of harm to others is  minimal in that he has not been involved in fights or had any legal charges filed on him.  Plan: We will admit the patient for crisis stabilization and treatment. I talked to pt about continuing the Atarax which works well for him. I explained the risks and benefits of medication in detail.  We will continue on q. 15 checks the unit protocol. At this time there is no clinical indication for one-to-one observation as patient contract for safety and presents little risk to harm themself and others.  We will increase collateral information. I encourage patient to participate in group milieu therapy. Pt was seen in treatment team meeting today for further  treatment and appropriate discharge planning. Please see history and physical note for more detailed information ELOS: 1 day.   Bellah Alia 02/26/2011, 5:31 PM

## 2011-02-27 MED ORDER — METOPROLOL TARTRATE 25 MG PO TABS: 25.0000 mg | ORAL_TABLET | Freq: Two times a day (BID) | ORAL | Status: DC

## 2011-02-27 MED ORDER — GEMFIBROZIL 600 MG PO TABS: 600.0000 mg | ORAL_TABLET | Freq: Every day | ORAL | Status: DC

## 2011-02-27 MED ORDER — TRAZODONE HCL 150 MG PO TABS: 150.0000 mg | ORAL_TABLET | Freq: Every evening | ORAL | Status: DC | PRN

## 2011-02-27 MED ORDER — LISINOPRIL-HYDROCHLOROTHIAZIDE 20-12.5 MG PO TABS: 1.0000 | ORAL_TABLET | Freq: Every day | ORAL | Status: DC

## 2011-02-27 MED ORDER — HYDROCHLOROTHIAZIDE 12.5 MG PO CAPS: 12.5000 mg | ORAL_CAPSULE | Freq: Every day | ORAL | Status: DC

## 2011-02-27 MED ORDER — NICOTINE 21 MG/24HR TD PT24: 1.0000 | MEDICATED_PATCH | Freq: Every day | TRANSDERMAL | Status: AC

## 2011-02-27 NOTE — Progress Notes (Signed)
[facility] Group Notes:  (Counselor/Nursing/MHT/Case Management/Adjunct)  02/27/2011 3:46 PM  Type of Therapy:  1:15PM Group Therapy  Participation Level:  Active  Participation Quality:  Appropriate and Attentive  Affect:  Appropriate  Cognitive:  Alert and Appropriate  Insight:  Good  Engagement in Group:  Good  Engagement in Therapy:  Good  Modes of Intervention:  Activity, Education and Exploration  Summary of Progress/Problems: Patient stated he enjoys using relaxation techniques in tense situations. He actively participated in group discussion. Patient appears invested in his treatment.   Wilmon Arms 02/27/2011, 3:46 PM  Cosigned by: Angus Palms, LCSW

## 2011-02-27 NOTE — Progress Notes (Signed)
[facility] Group Notes:  (Counselor/Nursing/MHT/Case Management/Adjunct)  02/27/2011 12:02 PM  Type of Therapy:  Group Therapy  Participation Level:  Active  Participation Quality:  Attentive  Affect:  Appropriate  Cognitive:  Appropriate  Insight:  Good  Engagement in Group:  Good  Engagement in Therapy:  Good  Modes of Intervention:  Support  Summary of Progress/Problems: Jacob Ali participated actively in group, offering support and suggestions for fellow group members.  Ali, Jacob Brien 02/27/2011, 12:02 PM

## 2011-02-27 NOTE — Progress Notes (Signed)
Patient ID: Jacob Ali, male   DOB: 1960-04-12, 51 y.o.   MRN: [id number]   Patient pleasant on approach today. Currently denies any SI. Denies depression today. Treatment team met with patient and felt patient ready for discharge. Obtained all belongings, meds called to pharmacy, and follow-up appointment scheduled.
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
## 2011-02-27 NOTE — Discharge Summary (Signed)
I agree with this D/C Summary.  

## 2011-02-27 NOTE — Progress Notes (Signed)
[facility] Family/Significant Other Suicide Prevention Education  Suicide Prevention Education:  Education Completed; Arline Asp, sister, [phone]) has been identified by the patient as the family member/significant other with whom the patient will be residing, and identified as the person(s) who will aid the patient in the event of a mental health crisis (suicidal ideations/suicide attempt).  With written consent from the patient, the family member/significant other has been provided the following suicide prevention education, prior to the and/or following the discharge of the patient.  The suicide prevention education provided includes the following:  Suicide risk factors  Suicide prevention and interventions  National Suicide Hotline telephone number  [facility] assessment telephone number  [facility] Emergency Assistance 911  [facility] and/or Residential Mobile Crisis Unit telephone number  Request made of family/significant other to:  Remove weapons (e.g., guns, rifles, knives), all items previously/currently identified as safety concern.    Remove drugs/medications (over-the-counter, prescriptions, illicit drugs), all items previously/currently identified as a safety concern.  Arline Asp reported that she does not have safety concerns for Jacob Ali. She stated that she believes he just got overwhelmed with all the things that have been going on and made a rash decision. Arline Asp verbalized understanding of suicide prevention information and had no questions. She indicated that Jacob Ali does not have access to firearms.    [name] 02/27/2011, 11:36 AM

## 2011-02-27 NOTE — Progress Notes (Signed)
Patient ID: Jacob Ali, male   DOB: [birth year], 51 y.o.   MRN: [id number] Pt. Eyes closed, resp. Even, no distress noted. Staff will continue to monitor q69min for safety.

## 2011-02-27 NOTE — Progress Notes (Signed)
[facility] Case Management Discharge Plan:  Will you be returning to the same living situation after discharge: No.Will spend a few days with family At discharge, do you have transportation home?:Yes,  Family to transport patient home Do you have the ability to pay for your medications:Yes,  Patient has private insurance  Interagency Information:     Release of information consent forms completed and in the chart;  Patient's signature needed at discharge.  Patient to Follow up at:    Patient denies SI/HI:   Yes,  He is currently denying SI    Safety Planning and Suicide Prevention discussed:  Yes,  Reviewed individually  Barrier to discharge identified:No.  Summary and Recommendations:  Follow up with outpatient appointments   Jenell Dobransky, Joesph July 02/27/2011, 11:02 AM

## 2011-02-27 NOTE — [facility] Suicide Risk Assessment (Signed)
Suicide Risk Assessment  Discharge Assessment     Demographic factors:  Assessment Details Time of Assessment: Admission Current Mental Status:  Current Mental Status: Suicidal ideation indicated by patient Risk Reduction Factors:  Risk Reduction Factors: Sense of responsibility to family;Positive social support  CLINICAL FACTORS:   Severe Anxiety and/or Agitation Previous Psychiatric Diagnoses and Treatments Medical Diagnoses and Treatments/Surgeries  COGNITIVE FEATURES THAT CONTRIBUTE TO RISK:  Thought constriction (tunnel vision)    SUICIDE RISK:   Minimal: No identifiable suicidal ideation.  Patients presenting with no risk factors but with morbid ruminations; may be classified as minimal risk based on the severity of the depressive symptoms  ADL's:  Intact  Sleep: Good  Appetite:  Good  Suicidal Ideation:  Denies adamantly any suicidal thoughts. Homicidal Ideation:  Denies adamantly any homicidal thoughts.  Mental Status Examination/Evaluation: Objective:  Appearance: Casual  Eye Contact::  Good  Speech:  Clear and Coherent  Volume:  Normal  Mood:  Euthymic  Affect:  Congruent  Thought Process:  Coherent  Orientation:  Full  Thought Content:  WDL  Suicidal Thoughts:  No  Homicidal Thoughts:  No  Memory:  Immediate;   Good  Judgement:  Good  Insight:  Good  Psychomotor Activity:  Normal  Concentration:  Good  Recall:  Good  Akathisia:  No  AIMS (if indicated):     Assets:  Communication Skills Desire for Improvement Financial Resources/Insurance Housing Intimacy Leisure Time Physical Health Resilience Social Support Talents/Skills Transportation Vocational/Educational  Sleep: Number of Hours: 6.75    Vital Signs: Blood pressure 153/81, pulse 111, temperature 97.9 F (36.6 C), temperature source Oral, resp. rate 20, height 5\' 9"  (1.753 m), weight 78.926 kg (174 lb).  Labs No results found for this or any previous visit (from the past 48  hour(s)).  What pt has learned from hospital stay is that he needs to live for his family and himself and his garden and call for help when he needs it.  Risk of self harm is elevated by his recent suicide attempt, but he has decided that he has his family to live for and his friends and his job and himself and his garden.  Risk of harm to others is minimal in that he has not been involved in fights or had any legal charges filed on him.  PLAN: Discharge home Continue Medication List  As of 02/27/2011  3:25 PM   TAKE these medications         gemfibrozil 600 MG tablet   Commonly known as: LOPID   Take 1 tablet (600 mg total) by mouth daily. To help lower cholesterol.      lisinopril-hydrochlorothiazide 20-12.5 MG per tablet   Commonly known as: PRINZIDE,ZESTORETIC   Take 1 tablet by mouth daily. For control of high blood pressure      metoprolol tartrate 25 MG tablet   Commonly known as: LOPRESSOR   Take 1 tablet (25 mg total) by mouth 2 (two) times daily. For control of high blood pressure      nicotine 21 mg/24hr patch   Commonly known as: NICODERM CQ - dosed in mg/24 hours   Place 1 patch onto the skin daily at 6 (six) AM. For smoking cessation. Should move to 14 mg daily for 2 more weeks, then on to 7 mg a day for the last 2 weeks, then stop all nicotine products.  There are support groups that can help with this.      traZODone 150 MG  tablet   Commonly known as: DESYREL   Take 1 tablet (150 mg total) by mouth at bedtime as needed for sleep.           Amalie Koran 02/27/2011, 2:48 PM

## 2011-03-01 NOTE — Progress Notes (Signed)
Patient Discharge Instructions:  Admission Note Faxed,  03/01/2011 After Visit Summary Faxed,  03/01/2011 Faxed to the [facility] Care provider:  03/01/2011 D/C Summary Note faxed 03/01/2011 Facesheet faxed 03/01/2011  Faxed to [facility] @ [phone]  Heloise Purpura, Eduard Clos, 03/01/2011, 6:57 PM

## 2011-05-03 ENCOUNTER — Ambulatory Visit (INDEPENDENT_AMBULATORY_CARE_PROVIDER_SITE_OTHER): Payer: BC Managed Care – PPO | Admitting: *Deleted

## 2011-05-03 VITALS — BP 126/80 | HR 96 | Temp 98.7°F | Resp 16 | Wt 171.0 lb

## 2011-05-03 DIAGNOSIS — B20 Human immunodeficiency virus [HIV] disease: Secondary | ICD-10-CM

## 2011-05-03 DIAGNOSIS — Z23 Encounter for immunization: Secondary | ICD-10-CM

## 2011-05-03 DIAGNOSIS — Z0000 Encounter for general adult medical examination without abnormal findings: Secondary | ICD-10-CM

## 2011-05-03 DIAGNOSIS — Z21 Asymptomatic human immunodeficiency virus [HIV] infection status: Secondary | ICD-10-CM

## 2011-05-04 LAB — BASIC METABOLIC PANEL
BUN: 15 mg/dL (ref 6–23)
CO2: 24 mEq/L (ref 19–32)
Calcium: 10.1 mg/dL (ref 8.4–10.5)
Chloride: 103 mEq/L (ref 96–112)
Creat: 0.92 mg/dL (ref 0.50–1.35)
Glucose, Bld: 90 mg/dL (ref 70–99)
Potassium: 4.4 mEq/L (ref 3.5–5.3)
Sodium: 136 mEq/L (ref 135–145)

## 2011-05-06 LAB — POCT URINALYSIS DIPSTICK
Bilirubin, UA: NEGATIVE
Glucose, UA: NEGATIVE
Ketones, UA: NEGATIVE
Leukocytes, UA: NEGATIVE
Nitrite, UA: NEGATIVE
Protein, UA: NEGATIVE
Spec Grav, UA: >=1.03
Urobilinogen, UA: 0.2
pH, UA: 6.5
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
## 2011-05-07 LAB — HIV-1 RNA QUANT-NO REFLEX-BLD
HIV 1 RNA Quant: 3181 copies/mL — ABNORMAL HIGH (ref <20)
HIV-1 RNA Quant, Log: 3.5 {Log} — ABNORMAL HIGH (ref <1.30)

## 2011-05-22 ENCOUNTER — Encounter: Payer: Self-pay | Admitting: Infectious Diseases

## 2011-05-22 LAB — CD4/CD8 (T-HELPER/T-SUPPRESSOR CELL)
CD4%: 26.8
CD4: 858
CD8 % Suppressor T Cell: 61.6
CD8: 1971

## 2011-05-30 ENCOUNTER — Encounter: Payer: Self-pay | Admitting: Infectious Diseases

## 2011-08-09 ENCOUNTER — Ambulatory Visit (INDEPENDENT_AMBULATORY_CARE_PROVIDER_SITE_OTHER): Payer: BC Managed Care – PPO | Admitting: *Deleted

## 2011-08-09 VITALS — BP 116/74 | HR 80 | Temp 98.6°F | Resp 16 | Wt 181.5 lb

## 2011-08-09 DIAGNOSIS — B20 Human immunodeficiency virus [HIV] disease: Secondary | ICD-10-CM

## 2011-08-09 DIAGNOSIS — Z21 Asymptomatic human immunodeficiency virus [HIV] infection status: Secondary | ICD-10-CM

## 2011-08-09 LAB — POCT URINALYSIS DIPSTICK
Bilirubin, UA: NEGATIVE
Blood, UA: NEGATIVE
Glucose, UA: NEGATIVE
Ketones, UA: NEGATIVE
Leukocytes, UA: NEGATIVE
Nitrite, UA: NEGATIVE
Protein, UA: NEGATIVE
Spec Grav, UA: 1.025
Urobilinogen, UA: 0.2
pH, UA: 6

## 2011-08-09 NOTE — Progress Notes (Signed)
Jacob Ali was here for his 8 month START study appt. He says his depression and anxiety has resolved and he has stopped taking the medication that he was put on for it. He denies any new problems currently. He will return in November at which time he will also see Dr. Ninetta Lights.

## 2011-08-10 LAB — BASIC METABOLIC PANEL
BUN: 17 mg/dL (ref 6–23)
CO2: 23 mEq/L (ref 19–32)
Calcium: 9.9 mg/dL (ref 8.4–10.5)
Chloride: 105 mEq/L (ref 96–112)
Creat: 0.98 mg/dL (ref 0.50–1.35)
Glucose, Bld: 98 mg/dL (ref 70–99)
Potassium: 4.2 mEq/L (ref 3.5–5.3)
Sodium: 139 mEq/L (ref 135–145)

## 2011-08-12 LAB — HIV-1 RNA QUANT-NO REFLEX-BLD

## 2011-08-19 ENCOUNTER — Ambulatory Visit (INDEPENDENT_AMBULATORY_CARE_PROVIDER_SITE_OTHER): Payer: BC Managed Care – PPO | Admitting: *Deleted

## 2011-08-19 DIAGNOSIS — B20 Human immunodeficiency virus [HIV] disease: Secondary | ICD-10-CM

## 2011-08-19 NOTE — Progress Notes (Signed)
Patient here today to redraw viral load for the START study.

## 2011-08-21 LAB — HIV-1 RNA QUANT-NO REFLEX-BLD
HIV 1 RNA Quant: 3813 copies/mL — ABNORMAL HIGH (ref <20)
HIV-1 RNA Quant, Log: 3.58 {Log} — ABNORMAL HIGH (ref <1.30)

## 2011-09-02 ENCOUNTER — Other Ambulatory Visit: Payer: Self-pay | Admitting: *Deleted

## 2011-09-02 NOTE — Addendum Note (Signed)
Addended by: Phill Myron on: 09/02/2011 03:24 PM   Modules accepted: Orders

## 2011-11-18 ENCOUNTER — Encounter: Payer: Self-pay | Admitting: Infectious Diseases

## 2011-11-18 ENCOUNTER — Ambulatory Visit (INDEPENDENT_AMBULATORY_CARE_PROVIDER_SITE_OTHER): Payer: BC Managed Care – PPO | Admitting: Infectious Diseases

## 2011-11-18 ENCOUNTER — Encounter (INDEPENDENT_AMBULATORY_CARE_PROVIDER_SITE_OTHER): Payer: Self-pay | Admitting: *Deleted

## 2011-11-18 ENCOUNTER — Other Ambulatory Visit: Payer: Self-pay | Admitting: *Deleted

## 2011-11-18 ENCOUNTER — Ambulatory Visit (INDEPENDENT_AMBULATORY_CARE_PROVIDER_SITE_OTHER): Payer: BC Managed Care – PPO | Admitting: *Deleted

## 2011-11-18 VITALS — BP 127/76 | HR 72 | Temp 97.7°F | Ht 69.0 in | Wt 183.8 lb

## 2011-11-18 VITALS — BP 132/81 | HR 79 | Temp 98.2°F | Resp 16 | Wt 184.5 lb

## 2011-11-18 DIAGNOSIS — Z79899 Other long term (current) drug therapy: Secondary | ICD-10-CM

## 2011-11-18 DIAGNOSIS — Z21 Asymptomatic human immunodeficiency virus [HIV] infection status: Secondary | ICD-10-CM

## 2011-11-18 DIAGNOSIS — B20 Human immunodeficiency virus [HIV] disease: Secondary | ICD-10-CM

## 2011-11-18 DIAGNOSIS — Z720 Tobacco use: Secondary | ICD-10-CM

## 2011-11-18 DIAGNOSIS — F17200 Nicotine dependence, unspecified, uncomplicated: Secondary | ICD-10-CM

## 2011-11-18 DIAGNOSIS — F411 Generalized anxiety disorder: Secondary | ICD-10-CM

## 2011-11-18 DIAGNOSIS — Z113 Encounter for screening for infections with a predominantly sexual mode of transmission: Secondary | ICD-10-CM

## 2011-11-18 LAB — POCT URINALYSIS DIPSTICK
Bilirubin, UA: NEGATIVE
Blood, UA: NEGATIVE
Glucose, UA: NEGATIVE
Ketones, UA: NEGATIVE
Leukocytes, UA: NEGATIVE
Nitrite, UA: NEGATIVE
Protein, UA: NEGATIVE
Spec Grav, UA: 1.02
Urobilinogen, UA: 0.2
pH, UA: 6

## 2011-11-18 LAB — COMPREHENSIVE METABOLIC PANEL
ALT: 25 U/L (ref 0–53)
AST: 20 U/L (ref 0–37)
Albumin: 4.3 g/dL (ref 3.5–5.2)
Alkaline Phosphatase: 38 U/L — ABNORMAL LOW (ref 39–117)
BUN: 16 mg/dL (ref 6–23)
CO2: 24 mEq/L (ref 19–32)
Calcium: 9.3 mg/dL (ref 8.4–10.5)
Chloride: 105 mEq/L (ref 96–112)
Creat: 0.94 mg/dL (ref 0.50–1.35)
Glucose, Bld: 100 mg/dL — ABNORMAL HIGH (ref 70–99)
Potassium: 4.2 mEq/L (ref 3.5–5.3)
Sodium: 134 mEq/L — ABNORMAL LOW (ref 135–145)
Total Bilirubin: 0.7 mg/dL (ref 0.3–1.2)
Total Protein: 7.2 g/dL (ref 6.0–8.3)

## 2011-11-18 LAB — LIPID PANEL
Cholesterol: 117 mg/dL (ref 0–200)
HDL: 26 mg/dL — ABNORMAL LOW (ref >39)
LDL Cholesterol: 74 mg/dL (ref 0–99)
Total CHOL/HDL Ratio: 4.5 Ratio
Triglycerides: 86 mg/dL (ref <150)
VLDL: 17 mg/dL (ref 0–40)

## 2011-11-18 MED ORDER — TRAZODONE HCL 150 MG PO TABS: 150.0000 mg | ORAL_TABLET | Freq: Every evening | ORAL | Status: DC | PRN

## 2011-11-18 NOTE — Progress Notes (Signed)
Patient here for his 12 month START study appt. He denies any new problems or concerns. Remains off of study meds as per protocol. He received his flushot at work.

## 2011-11-18 NOTE — Progress Notes (Signed)
  Subjective:    Patient ID: Jacob Ali, male    DOB: 08/02/1960, 51 y.o.   MRN: [id number]  HPI 51 yo M with hx of HTN, hyperlipidemia came to ID clinic after new HIV+ September 2012. Was in hospital 02-20-11 after tylenol overdose and cutting his wrists.  He was in behavioral health for 1 day afterwards. Stayed with his sister for awhile afterwards. Now is worried he is "agoraphobic", prefers to be alone. Does not feel depressed. Just doesn't want to do anything.  Is only taking lisinopril and lopid. Would like a refill of trazadone to help him with sleep.  He is on deferal arm of START study.  Has quit smoking. Has more energy now, has gained some wt since quitting.  Has previously had surgery for anal fissures, worse when he does not have enough fiber in his diet.   HIV 1 RNA Quant (copies/mL)  Date Value  08/19/2011 3813*  08/09/2011 TEST NOT PERFORMED   05/03/2011 3181*     CD4 T Cell Abs (cmm)  Date Value  10/16/2010 1020        Review of Systems  Constitutional: Negative for appetite change and unexpected weight change.  Respiratory: Negative for cough and shortness of breath.   Gastrointestinal: Positive for constipation.  Genitourinary: Negative for difficulty urinating.  Psychiatric/Behavioral: Positive for sleep disturbance.       Objective:   Physical Exam  Constitutional: He appears well-developed and well-nourished.  HENT:  Mouth/Throat: No oropharyngeal exudate.  Eyes: EOM are normal. Pupils are equal, round, and reactive to light.  Neck: Neck supple.  Cardiovascular: Normal rate, regular rhythm and normal heart sounds.   Pulmonary/Chest: Effort normal and breath sounds normal.  Abdominal: Soft. Bowel sounds are normal. There is no tenderness. There is no rebound.  Lymphadenopathy:    He has no cervical adenopathy.  Psychiatric: He has a normal mood and affect. Judgment and thought content normal. He expresses no suicidal ideation. He expresses no suicidal  plans.          Assessment & Plan:

## 2011-11-18 NOTE — Assessment & Plan Note (Signed)
Has quit, will mark as resolved.  

## 2011-11-18 NOTE — Assessment & Plan Note (Addendum)
Will refill his trazadone. He is not suicidal. I offered to get him in with mental health counseling here in [place]. He has counselor at his work place [employer]) and has been working with him.

## 2011-11-18 NOTE — Assessment & Plan Note (Signed)
He is on START deferral arm. Will continue to watch his labs as done by them. Will set him up for repeat labs in 6 months with his return visit. Offered/refused condoms. Has gotten flu.

## 2011-11-19 LAB — HIV-1 RNA QUANT-NO REFLEX-BLD
HIV 1 RNA Quant: 2998 copies/mL — ABNORMAL HIGH (ref <20)
HIV-1 RNA Quant, Log: 3.48 {Log} — ABNORMAL HIGH (ref <1.30)

## 2011-11-20 ENCOUNTER — Encounter: Payer: Self-pay | Admitting: Infectious Diseases

## 2011-11-20 LAB — CD4/CD8 (T-HELPER/T-SUPPRESSOR CELL)
CD4%: 30.8
CD4: 585
CD8 % Suppressor T Cell: 57.4
CD8: 1091

## 2011-12-11 ENCOUNTER — Encounter: Payer: Self-pay | Admitting: Infectious Diseases

## 2012-02-04 ENCOUNTER — Telehealth: Payer: Self-pay | Admitting: *Deleted

## 2012-02-04 NOTE — Telephone Encounter (Signed)
Called patient and left voice mail to return my call. He dropped off FMLA paperwork and wanted the duration of condition listed as 1 year. I believe this should be lifetime. Will wait on patient to return my call before giving to Dr. Ninetta Lights to fill out. Wendall Mola CMA

## 2012-04-06 ENCOUNTER — Ambulatory Visit: Payer: Self-pay | Admitting: Infectious Diseases

## 2012-04-15 ENCOUNTER — Encounter: Payer: Self-pay | Admitting: Infectious Diseases

## 2012-04-15 ENCOUNTER — Ambulatory Visit (INDEPENDENT_AMBULATORY_CARE_PROVIDER_SITE_OTHER): Payer: Self-pay | Admitting: Infectious Diseases

## 2012-04-15 ENCOUNTER — Ambulatory Visit (INDEPENDENT_AMBULATORY_CARE_PROVIDER_SITE_OTHER): Payer: Self-pay | Admitting: *Deleted

## 2012-04-15 VITALS — BP 130/75 | HR 85 | Temp 98.3°F | Resp 18 | Wt 185.0 lb

## 2012-04-15 VITALS — BP 130/75 | HR 85 | Temp 98.3°F | Ht 69.0 in | Wt 185.0 lb

## 2012-04-15 DIAGNOSIS — I10 Essential (primary) hypertension: Secondary | ICD-10-CM

## 2012-04-15 DIAGNOSIS — G4700 Insomnia, unspecified: Secondary | ICD-10-CM

## 2012-04-15 DIAGNOSIS — E785 Hyperlipidemia, unspecified: Secondary | ICD-10-CM

## 2012-04-15 DIAGNOSIS — F411 Generalized anxiety disorder: Secondary | ICD-10-CM

## 2012-04-15 DIAGNOSIS — J329 Chronic sinusitis, unspecified: Secondary | ICD-10-CM | POA: Insufficient documentation

## 2012-04-15 DIAGNOSIS — Z21 Asymptomatic human immunodeficiency virus [HIV] infection status: Secondary | ICD-10-CM

## 2012-04-15 DIAGNOSIS — B20 Human immunodeficiency virus [HIV] disease: Secondary | ICD-10-CM

## 2012-04-15 LAB — CD4/CD8 (T-HELPER/T-SUPPRESSOR CELL)
CD4: 769
CD8 % Suppressor T Cell: 60.9
CD8: 1766

## 2012-04-15 MED ORDER — TRAZODONE HCL 150 MG PO TABS: 150.0000 mg | ORAL_TABLET | Freq: Every evening | ORAL | Status: DC | PRN

## 2012-04-15 MED ORDER — AZITHROMYCIN 250 MG PO TABS: ORAL_TABLET | ORAL | Status: DC

## 2012-04-15 MED ORDER — GUAIFENESIN-CODEINE 100-10 MG/5ML PO SYRP: 5.0000 mL | ORAL_SOLUTION | Freq: Three times a day (TID) | ORAL | Status: DC | PRN

## 2012-04-15 NOTE — Assessment & Plan Note (Signed)
Well controlled on ACE-I. Will cont. Prev Cr nl.

## 2012-04-15 NOTE — Assessment & Plan Note (Signed)
Will refill his trazadone.  

## 2012-04-15 NOTE — Assessment & Plan Note (Signed)
Trial of tussionex and z-pack.

## 2012-04-15 NOTE — Progress Notes (Signed)
Pt here for START study, month 16. He is in deferred arm. Pt denies any new problems, signs, or symptoms. Nonfasting labs were drawn with no problems. He received $20.00 gift card for visit. Next appointment scheduled for Friday, July 24, 2012 @ 3:30pm Tacey Heap RN

## 2012-04-15 NOTE — Assessment & Plan Note (Signed)
Had repeat labs done today. Await results.

## 2012-04-15 NOTE — Assessment & Plan Note (Signed)
He's doing well on deferal arm of START. Offered/refused condoms. Will see him back in 6 months.

## 2012-04-15 NOTE — Progress Notes (Signed)
  Subjective:    Patient ID: Jacob Ali, male    DOB: 06/17/1960, 52 y.o.   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  Was in hospital 02-20-11 after tylenol overdose and cutting his wrists.  Has been on lopid, lisinopril. On deferral arm of START study.  November 2014 CD4 585, VL 2,998.  Has gotten sinus infection over last 4 days. Has been coughing more, prod of green sputum. No fever or chills, no nose bleeds, occas headaches.  Otherwise feels well.  Mood has been fine.   Review of Systems  Constitutional: Negative for appetite change and unexpected weight change.  Gastrointestinal: Positive for constipation. Negative for diarrhea.  Genitourinary: Negative for difficulty urinating.       Objective:   Physical Exam  Constitutional: He appears well-developed and well-nourished.  HENT:  Mouth/Throat: No oropharyngeal exudate.  Eyes: EOM are normal. Pupils are equal, round, and reactive to light.  Neck: Neck supple.  Cardiovascular: Normal rate, regular rhythm and normal heart sounds.   Pulmonary/Chest: Effort normal and breath sounds normal.  Abdominal: Soft. Bowel sounds are normal. There is no tenderness.  Lymphadenopathy:    He has no cervical adenopathy.  Psychiatric: He has a normal mood and affect. Thought content normal.          Assessment & Plan:

## 2012-04-16 ENCOUNTER — Ambulatory Visit: Payer: Self-pay | Admitting: Infectious Diseases

## 2012-04-17 LAB — HIV-1 RNA QUANT-NO REFLEX-BLD
HIV 1 RNA Quant: 2214 copies/mL — ABNORMAL HIGH (ref <20)
HIV-1 RNA Quant, Log: 3.35 {Log} — ABNORMAL HIGH (ref <1.30)

## 2012-05-04 ENCOUNTER — Encounter: Payer: Self-pay | Admitting: Infectious Diseases

## 2012-07-24 ENCOUNTER — Ambulatory Visit (INDEPENDENT_AMBULATORY_CARE_PROVIDER_SITE_OTHER): Payer: Self-pay | Admitting: *Deleted

## 2012-07-24 VITALS — BP 136/86 | HR 89 | Temp 98.1°F | Resp 18 | Wt 185.0 lb

## 2012-07-24 DIAGNOSIS — B20 Human immunodeficiency virus [HIV] disease: Secondary | ICD-10-CM

## 2012-07-24 DIAGNOSIS — Z21 Asymptomatic human immunodeficiency virus [HIV] infection status: Secondary | ICD-10-CM

## 2012-07-27 LAB — HIV-1 RNA QUANT-NO REFLEX-BLD
HIV 1 RNA Quant: 2939 copies/mL — ABNORMAL HIGH (ref <20)
HIV-1 RNA Quant, Log: 3.47 {Log} — ABNORMAL HIGH (ref <1.30)

## 2012-07-28 NOTE — Progress Notes (Signed)
Pt here for START, 20 month. C/O achy fingers bilat that started around May 2014. Labs obtained and vital signs stable. He received $20 gift card for visit. Next appointmtent scheduled for Oct 21, 2012 @ 9am. Tacey Heap RN

## 2012-07-29 ENCOUNTER — Encounter: Payer: Self-pay | Admitting: Infectious Diseases

## 2012-07-29 LAB — CD4/CD8 (T-HELPER/T-SUPPRESSOR CELL)
CD4%: 24.3
CD4: 875
CD8 % Suppressor T Cell: 63.7
CD8: 2273

## 2012-08-11 ENCOUNTER — Encounter: Payer: Self-pay | Admitting: Infectious Diseases

## 2012-10-13 ENCOUNTER — Other Ambulatory Visit: Payer: Self-pay

## 2012-10-14 ENCOUNTER — Other Ambulatory Visit: Payer: Self-pay

## 2012-10-15 ENCOUNTER — Ambulatory Visit: Payer: Self-pay | Admitting: Infectious Diseases

## 2012-10-21 ENCOUNTER — Ambulatory Visit: Payer: Self-pay | Admitting: Infectious Diseases

## 2012-10-27 ENCOUNTER — Ambulatory Visit (INDEPENDENT_AMBULATORY_CARE_PROVIDER_SITE_OTHER): Payer: PRIVATE HEALTH INSURANCE | Admitting: Internal Medicine

## 2012-10-27 ENCOUNTER — Ambulatory Visit (INDEPENDENT_AMBULATORY_CARE_PROVIDER_SITE_OTHER): Payer: Self-pay | Admitting: *Deleted

## 2012-10-27 ENCOUNTER — Encounter: Payer: Self-pay | Admitting: *Deleted

## 2012-10-27 ENCOUNTER — Encounter: Payer: Self-pay | Admitting: Infectious Diseases

## 2012-10-27 ENCOUNTER — Ambulatory Visit (INDEPENDENT_AMBULATORY_CARE_PROVIDER_SITE_OTHER): Payer: PRIVATE HEALTH INSURANCE | Admitting: Infectious Diseases

## 2012-10-27 VITALS — BP 148/90 | HR 80 | Temp 98.2°F | Resp 16 | Ht 69.0 in | Wt 185.0 lb

## 2012-10-27 VITALS — BP 148/90 | HR 80 | Temp 98.2°F | Ht 69.0 in | Wt 185.0 lb

## 2012-10-27 DIAGNOSIS — B20 Human immunodeficiency virus [HIV] disease: Secondary | ICD-10-CM

## 2012-10-27 DIAGNOSIS — Z21 Asymptomatic human immunodeficiency virus [HIV] infection status: Secondary | ICD-10-CM

## 2012-10-27 DIAGNOSIS — Z0000 Encounter for general adult medical examination without abnormal findings: Secondary | ICD-10-CM

## 2012-10-27 DIAGNOSIS — Z23 Encounter for immunization: Secondary | ICD-10-CM

## 2012-10-27 DIAGNOSIS — N529 Male erectile dysfunction, unspecified: Secondary | ICD-10-CM | POA: Insufficient documentation

## 2012-10-27 LAB — LIPID PANEL
Cholesterol: 139 mg/dL (ref 0–200)
HDL: 27 mg/dL — ABNORMAL LOW (ref >39)
LDL Cholesterol: 82 mg/dL (ref 0–99)
Total CHOL/HDL Ratio: 5.1 Ratio
Triglycerides: 150 mg/dL — ABNORMAL HIGH (ref <150)
VLDL: 30 mg/dL (ref 0–40)

## 2012-10-27 LAB — COMPREHENSIVE METABOLIC PANEL
ALT: 21 U/L (ref 0–53)
AST: 18 U/L (ref 0–37)
Albumin: 4.5 g/dL (ref 3.5–5.2)
Alkaline Phosphatase: 51 U/L (ref 39–117)
BUN: 10 mg/dL (ref 6–23)
CO2: 26 mEq/L (ref 19–32)
Calcium: 9.4 mg/dL (ref 8.4–10.5)
Chloride: 103 mEq/L (ref 96–112)
Creat: 0.9 mg/dL (ref 0.50–1.35)
Glucose, Bld: 88 mg/dL (ref 70–99)
Potassium: 4 mEq/L (ref 3.5–5.3)
Sodium: 133 mEq/L — ABNORMAL LOW (ref 135–145)
Total Bilirubin: 0.5 mg/dL (ref 0.3–1.2)
Total Protein: 7.9 g/dL (ref 6.0–8.3)

## 2012-10-27 LAB — POCT URINALYSIS DIPSTICK
Bilirubin, UA: NEGATIVE
Blood, UA: NEGATIVE
Glucose, UA: NEGATIVE
Ketones, UA: NEGATIVE
Leukocytes, UA: NEGATIVE
Nitrite, UA: NEGATIVE
Protein, UA: NEGATIVE
Spec Grav, UA: 1.02
Urobilinogen, UA: 0.2
pH, UA: 6

## 2012-10-27 LAB — CD4/CD8 (T-HELPER/T-SUPPRESSOR CELL)
CD4%: 24.3
CD4: 583
CD8 % Suppressor T Cell: 65.1
CD8: 1562

## 2012-10-27 LAB — RPR

## 2012-10-27 MED ORDER — SILDENAFIL CITRATE 25 MG PO TABS: 25.0000 mg | ORAL_TABLET | Freq: Every day | ORAL | Status: DC | PRN

## 2012-10-27 NOTE — Addendum Note (Signed)
Addended by: Laurell Josephs on: 10/27/2012 02:41 PM   Modules accepted: Orders

## 2012-10-27 NOTE — Assessment & Plan Note (Addendum)
Will continue ACE-I/HCTZ. Has f/u at clinic at work

## 2012-10-27 NOTE — Progress Notes (Signed)
  Subjective:    Patient ID: Jacob Ali, male    DOB: [birth year], 52 y.o.   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  Was in hospital 02-20-11 after tylenol overdose and cutting his wrists.  Has been on lopid, lisinopril. On deferral arm of START study.  July 2014 CD4 875, VL 2,214.   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  Dogs keep him up at night at times. Has good relief with trazadone prn. Tries to get 8h sleep/night. Prev discussed testoterone, has probs with fatigue.   HIV 1 RNA Quant (copies/mL)  Date Value  07/24/2012 2939*  04/15/2012 2214*  11/18/2011 2998*     CD4 T Cell Abs (cmm)  Date Value  10/16/2010 1020    Review of Systems  Constitutional: Positive for fatigue. Negative for appetite change and unexpected weight change.  Gastrointestinal: Negative for diarrhea and constipation.  Genitourinary: Negative for difficulty urinating.  Psychiatric/Behavioral: Positive for sleep disturbance.  having some issues with erectile dysfunction. Cialis did not work.     Objective:   Physical Exam  Constitutional: He appears well-developed and well-nourished.  Eyes: EOM are normal. Pupils are equal, round, and reactive to light.  Cardiovascular: Normal rate, regular rhythm and normal heart sounds.   Pulmonary/Chest: Effort normal and breath sounds normal.  Abdominal: Soft. Bowel sounds are normal. He exhibits no distension. There is no tenderness.  Lymphadenopathy:    He has no cervical adenopathy.          Assessment & Plan:

## 2012-10-27 NOTE — Assessment & Plan Note (Signed)
Will continue to refill trazadone.

## 2012-10-27 NOTE — Assessment & Plan Note (Signed)
Given viagra sample card. Encouraged condom use. Gets testosterone level today, he will call back about need for injections.

## 2012-10-27 NOTE — Assessment & Plan Note (Signed)
Will cont on observe arm of START. He is doing well. vax up to date. Given condoms, rtc in 6 months.

## 2012-10-27 NOTE — Progress Notes (Signed)
Jacob Ali is here for START study, 24 month. He is on deferred arm. C/o increased fatigue. Will check testosterone per Dr. Ninetta Lights. EKG obtained and questionnaires completed. He received flu vaccine as well as Tdap at this visit. Fasting labs were drawn prior to administration of vacinations. He received $20 gift card for visit. Next appt will be around March 2015. Tacey Heap RN

## 2012-10-28 LAB — HIV-1 RNA QUANT-NO REFLEX-BLD
HIV 1 RNA Quant: 4151 copies/mL — ABNORMAL HIGH (ref <20)
HIV-1 RNA Quant, Log: 3.62 {Log} — ABNORMAL HIGH (ref <1.30)

## 2012-10-28 LAB — TESTOSTERONE: Testosterone: 316 ng/dL (ref 300–890)

## 2012-10-29 ENCOUNTER — Ambulatory Visit: Payer: Self-pay | Admitting: Infectious Diseases

## 2012-11-06 ENCOUNTER — Encounter: Payer: Self-pay | Admitting: Infectious Disease

## 2013-02-15 ENCOUNTER — Telehealth: Payer: Self-pay

## 2013-02-15 NOTE — Telephone Encounter (Signed)
Patient informed his FMLA form has been completed and mailed to employer.  A copy has also sent to his home address and one for scanning into medical records   [place], RN

## 2013-02-26 ENCOUNTER — Other Ambulatory Visit: Payer: Self-pay | Admitting: Infectious Diseases

## 2013-02-26 ENCOUNTER — Other Ambulatory Visit: Payer: Self-pay | Admitting: *Deleted

## 2013-02-26 DIAGNOSIS — N529 Male erectile dysfunction, unspecified: Secondary | ICD-10-CM

## 2013-02-26 MED ORDER — SILDENAFIL CITRATE 25 MG PO TABS: 25.0000 mg | ORAL_TABLET | Freq: Every day | ORAL | Status: DC | PRN

## 2013-02-26 NOTE — Telephone Encounter (Signed)
Sent refill of viagra to patient's pharmacy.

## 2013-03-29 ENCOUNTER — Other Ambulatory Visit: Payer: Self-pay | Admitting: Infectious Diseases

## 2013-03-29 DIAGNOSIS — J810 Acute pulmonary edema: Secondary | ICD-10-CM

## 2013-03-29 DIAGNOSIS — N529 Male erectile dysfunction, unspecified: Secondary | ICD-10-CM

## 2013-04-27 ENCOUNTER — Ambulatory Visit (INDEPENDENT_AMBULATORY_CARE_PROVIDER_SITE_OTHER): Payer: Self-pay | Admitting: *Deleted

## 2013-04-27 ENCOUNTER — Ambulatory Visit (INDEPENDENT_AMBULATORY_CARE_PROVIDER_SITE_OTHER): Payer: PRIVATE HEALTH INSURANCE | Admitting: Infectious Diseases

## 2013-04-27 VITALS — BP 160/95 | HR 80 | Temp 98.6°F | Wt 183.5 lb

## 2013-04-27 VITALS — BP 153/90 | HR 80 | Temp 98.6°F | Resp 16 | Wt 183.5 lb

## 2013-04-27 DIAGNOSIS — F17200 Nicotine dependence, unspecified, uncomplicated: Secondary | ICD-10-CM

## 2013-04-27 DIAGNOSIS — E785 Hyperlipidemia, unspecified: Secondary | ICD-10-CM

## 2013-04-27 DIAGNOSIS — T1491XA Suicide attempt, initial encounter: Secondary | ICD-10-CM

## 2013-04-27 DIAGNOSIS — X838XXA Intentional self-harm by other specified means, initial encounter: Secondary | ICD-10-CM

## 2013-04-27 DIAGNOSIS — Z21 Asymptomatic human immunodeficiency virus [HIV] infection status: Secondary | ICD-10-CM

## 2013-04-27 DIAGNOSIS — F411 Generalized anxiety disorder: Secondary | ICD-10-CM

## 2013-04-27 DIAGNOSIS — I10 Essential (primary) hypertension: Secondary | ICD-10-CM

## 2013-04-27 DIAGNOSIS — G4700 Insomnia, unspecified: Secondary | ICD-10-CM

## 2013-04-27 DIAGNOSIS — B20 Human immunodeficiency virus [HIV] disease: Secondary | ICD-10-CM

## 2013-04-27 NOTE — Assessment & Plan Note (Signed)
Mildly up. Getting f/u from nurse at work.

## 2013-04-27 NOTE — Assessment & Plan Note (Signed)
Appears to be doing well.

## 2013-04-27 NOTE — Assessment & Plan Note (Addendum)
Doing well. Await his labs from [facility]. Offered/refuses condoms. vax up to date (hep B S Ab/Core+). Will see him back in 6 months.  Gets regular colonoscopy via [facility] since in his 34s. Next due in 4-5 yrs.

## 2013-04-27 NOTE — Assessment & Plan Note (Addendum)
Labs today. Continue lopid.

## 2013-04-27 NOTE — Progress Notes (Signed)
   Subjective:    Patient ID: Jacob Ali, male    DOB: Dec 06, 1960, 53 y.o.   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  Was in hospital 02-20-11 after tylenol overdose and cutting his wrists.  Has been on lopid, lisinopril. Doing well with these except forgot BP rx yesterday. Attributes high BP today to rushing to get here from work.  On deferral arm of START study.  CD4 583 vl 7026 (oct 2014). Had [facility] visit/labs today.  Has wisdom teeth out last week and trouble getting enough fiber. Was on oxycodone as well. Constipated, took dose of fiber and then back to normal.  Needs to exercise.   Review of Systems  Constitutional: Negative for appetite change and unexpected weight change.  Cardiovascular: Negative for chest pain.  Gastrointestinal: Negative for diarrhea and constipation.  Genitourinary: Negative for difficulty urinating.  Neurological: Negative for headaches.       Objective:   Physical Exam  Constitutional: He appears well-developed and well-nourished.  HENT:  Mouth/Throat: No oropharyngeal exudate.  Eyes: EOM are normal. Pupils are equal, round, and reactive to light.  Neck: Neck supple.  Cardiovascular: Normal rate, regular rhythm and normal heart sounds.   Pulmonary/Chest: Effort normal and breath sounds normal.  Abdominal: Soft. Bowel sounds are normal. He exhibits no distension. There is no tenderness.  Musculoskeletal: He exhibits no edema.  Lymphadenopathy:    He has no cervical adenopathy.  Psychiatric: He has a normal mood and affect. Judgment and thought content normal.          Assessment & Plan:

## 2013-04-27 NOTE — Progress Notes (Signed)
Patient here for month 28 START study visit. He says he has been fine, except for having his wisdom teeth out last month and his mouth is still a little sore. He is still not on ARVS per study. He will return in 4 months for the next visit.

## 2013-04-27 NOTE — Assessment & Plan Note (Signed)
Denies current SI.

## 2013-04-27 NOTE — Assessment & Plan Note (Signed)
Has been sleeping well. Has not been taking trazadone.

## 2013-04-27 NOTE — Assessment & Plan Note (Signed)
Smokes rare cigar. Encouraged to quit.

## 2013-04-29 ENCOUNTER — Encounter: Payer: Self-pay | Admitting: *Deleted

## 2013-04-29 LAB — HIV-1 RNA QUANT-NO REFLEX-BLD
HIV 1 RNA Quant: 13469 copies/mL — ABNORMAL HIGH (ref <20)
HIV-1 RNA Quant, Log: 4.13 {Log} — ABNORMAL HIGH (ref <1.30)

## 2013-04-29 NOTE — Progress Notes (Signed)
Study patient, not on meds. Ok per Dr. Johnnye Sima

## 2013-05-05 ENCOUNTER — Encounter: Payer: Self-pay | Admitting: Infectious Diseases

## 2013-05-05 LAB — CD4/CD8 (T-HELPER/T-SUPPRESSOR CELL)
CD4%: 23.3
CD4: 629
CD8 % Suppressor T Cell: 66.2
CD8: 1787

## 2013-05-24 ENCOUNTER — Encounter: Payer: Self-pay | Admitting: Infectious Diseases

## 2013-06-23 ENCOUNTER — Ambulatory Visit (INDEPENDENT_AMBULATORY_CARE_PROVIDER_SITE_OTHER): Payer: PRIVATE HEALTH INSURANCE | Admitting: Infectious Diseases

## 2013-06-23 ENCOUNTER — Encounter: Payer: Self-pay | Admitting: Infectious Diseases

## 2013-06-23 ENCOUNTER — Ambulatory Visit (INDEPENDENT_AMBULATORY_CARE_PROVIDER_SITE_OTHER): Payer: Self-pay | Admitting: *Deleted

## 2013-06-23 VITALS — BP 144/79 | HR 86 | Temp 98.7°F | Ht 69.0 in | Wt 184.0 lb

## 2013-06-23 DIAGNOSIS — I10 Essential (primary) hypertension: Secondary | ICD-10-CM

## 2013-06-23 DIAGNOSIS — Z21 Asymptomatic human immunodeficiency virus [HIV] infection status: Secondary | ICD-10-CM

## 2013-06-23 DIAGNOSIS — Z113 Encounter for screening for infections with a predominantly sexual mode of transmission: Secondary | ICD-10-CM

## 2013-06-23 DIAGNOSIS — K219 Gastro-esophageal reflux disease without esophagitis: Secondary | ICD-10-CM | POA: Insufficient documentation

## 2013-06-23 DIAGNOSIS — E785 Hyperlipidemia, unspecified: Secondary | ICD-10-CM

## 2013-06-23 DIAGNOSIS — B20 Human immunodeficiency virus [HIV] disease: Secondary | ICD-10-CM

## 2013-06-23 DIAGNOSIS — N529 Male erectile dysfunction, unspecified: Secondary | ICD-10-CM

## 2013-06-23 DIAGNOSIS — Z79899 Other long term (current) drug therapy: Secondary | ICD-10-CM

## 2013-06-23 MED ORDER — ELVITEG-COBIC-EMTRICIT-TENOFDF 150-150-200-300 MG PO TABS: 1.0000 | ORAL_TABLET | Freq: Every day | ORAL | Status: DC

## 2013-06-23 NOTE — Assessment & Plan Note (Signed)
He has been unblinded from START. Discussed options with him. Will start him on stribild. Will see him back in 3 months to recheck his labs. Offered/refused condoms. Hep B immune/vax, Hep A vaccinated.Marland Kitchen

## 2013-06-23 NOTE — Assessment & Plan Note (Signed)
Lab Results  Component Value Date   CHOL 139 10/27/2012   HDL 27* 10/27/2012   LDLCALC 82 10/27/2012   TRIG 150* 10/27/2012   CHOLHDL 5.1 10/27/2012    Need to recheck his levels.  No change in meds for now.

## 2013-06-23 NOTE — Progress Notes (Signed)
Discussed findings of the START study with participant. He saw Dr. Johnnye Sima today who prescribed STRIBILD for him. We will get the med through the study and let him know when it is ready for pickup. We went ahead and did his month 32 visit so he can return  End of September and get his yearly labs through the study. He will see Dr. Johnnye Sima again shortly after that visit.

## 2013-06-23 NOTE — Assessment & Plan Note (Signed)
Cautioned regarding increase risk of side effects with new ART and Viagra, use with caution.

## 2013-06-23 NOTE — Assessment & Plan Note (Signed)
He will continue on omeprazole, I offered to have him endoscoped/seen by GI, but he refuses.

## 2013-06-23 NOTE — Assessment & Plan Note (Signed)
Fairly well controlled, no sx. No change in meds for now, cont to monitor at work.

## 2013-06-23 NOTE — Progress Notes (Signed)
   Subjective:    Patient ID: Jacob Ali, male    DOB: 11/26/1960, 53 y.o.   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  Was in hospital 02-20-11 after tylenol overdose and cutting his wrists.  Has been on lopid, lisinopril. Tests his BP weekly at work and has been good.  Has been taking omeprazole for GERD which he has had since childhood. Has never seen GI.  On deferral arm of START study. Last CD4 660 and VL 14k.   HIV 1 RNA Quant (copies/mL)  Date Value  04/27/2013 13469*  10/27/2012 4151*  07/24/2012 2939*     CD4 T Cell Abs (cmm)  Date Value  10/16/2010 1020     Review of Systems  Constitutional: Negative for appetite change and unexpected weight change.  Cardiovascular: Negative for chest pain.  Gastrointestinal: Negative for diarrhea and constipation.  Genitourinary: Negative for difficulty urinating.  Neurological: Negative for headaches.       Objective:   Physical Exam  Constitutional: He appears well-developed and well-nourished.  HENT:  Mouth/Throat: No oropharyngeal exudate.  Eyes: EOM are normal. Pupils are equal, round, and reactive to light.  Neck: Neck supple.  Cardiovascular: Normal rate, regular rhythm and normal heart sounds.   Pulmonary/Chest: Effort normal and breath sounds normal.  Abdominal: Soft. Bowel sounds are normal. There is no tenderness. There is no rebound.  Lymphadenopathy:    He has no cervical adenopathy.          Assessment & Plan:

## 2013-06-25 LAB — HIV-1 RNA QUANT-NO REFLEX-BLD
HIV 1 RNA Quant: 12146 copies/mL — ABNORMAL HIGH (ref <20)
HIV-1 RNA Quant, Log: 4.08 {Log} — ABNORMAL HIGH (ref <1.30)

## 2013-07-02 ENCOUNTER — Encounter: Payer: Self-pay | Admitting: Infectious Diseases

## 2013-07-02 LAB — CD4/CD8 (T-HELPER/T-SUPPRESSOR CELL)
CD4%: 20.7
CD4: 683
CD8 % Suppressor T Cell: 68
CD8: 2244

## 2013-07-27 ENCOUNTER — Encounter: Payer: Self-pay | Admitting: Infectious Diseases

## 2013-10-04 ENCOUNTER — Encounter: Payer: Self-pay | Admitting: Infectious Diseases

## 2013-10-06 ENCOUNTER — Ambulatory Visit (INDEPENDENT_AMBULATORY_CARE_PROVIDER_SITE_OTHER): Payer: Self-pay | Admitting: *Deleted

## 2013-10-06 ENCOUNTER — Ambulatory Visit (INDEPENDENT_AMBULATORY_CARE_PROVIDER_SITE_OTHER): Payer: PRIVATE HEALTH INSURANCE | Admitting: Infectious Diseases

## 2013-10-06 VITALS — BP 135/88 | HR 82 | Temp 98.4°F | Resp 17 | Wt 174.5 lb

## 2013-10-06 VITALS — BP 125/88 | HR 82 | Temp 98.4°F | Resp 17 | Wt 174.5 lb

## 2013-10-06 DIAGNOSIS — Z23 Encounter for immunization: Secondary | ICD-10-CM

## 2013-10-06 DIAGNOSIS — Z21 Asymptomatic human immunodeficiency virus [HIV] infection status: Secondary | ICD-10-CM

## 2013-10-06 DIAGNOSIS — J328 Other chronic sinusitis: Secondary | ICD-10-CM

## 2013-10-06 DIAGNOSIS — Z113 Encounter for screening for infections with a predominantly sexual mode of transmission: Secondary | ICD-10-CM

## 2013-10-06 DIAGNOSIS — B20 Human immunodeficiency virus [HIV] disease: Secondary | ICD-10-CM

## 2013-10-06 DIAGNOSIS — Z006 Encounter for examination for normal comparison and control in clinical research program: Secondary | ICD-10-CM

## 2013-10-06 LAB — POCT URINALYSIS DIPSTICK
Bilirubin, UA: NEGATIVE
Blood, UA: NEGATIVE
Glucose, UA: NEGATIVE
Ketones, UA: NEGATIVE
Leukocytes, UA: NEGATIVE
Nitrite, UA: NEGATIVE
Protein, UA: NEGATIVE
Spec Grav, UA: 1.02
Urobilinogen, UA: 0.2
pH, UA: 6

## 2013-10-06 LAB — COMPREHENSIVE METABOLIC PANEL
ALT: 28 U/L (ref 0–53)
AST: 23 U/L (ref 0–37)
Albumin: 5.1 g/dL (ref 3.5–5.2)
Alkaline Phosphatase: 61 U/L (ref 39–117)
BUN: 16 mg/dL (ref 6–23)
CO2: 22 mEq/L (ref 19–32)
Calcium: 9.8 mg/dL (ref 8.4–10.5)
Chloride: 98 mEq/L (ref 96–112)
Creat: 1.14 mg/dL (ref 0.50–1.35)
Glucose, Bld: 92 mg/dL (ref 70–99)
Potassium: 4.1 mEq/L (ref 3.5–5.3)
Sodium: 134 mEq/L — ABNORMAL LOW (ref 135–145)
Total Bilirubin: 0.9 mg/dL (ref 0.2–1.2)
Total Protein: 8.7 g/dL — ABNORMAL HIGH (ref 6.0–8.3)

## 2013-10-06 LAB — LIPID PANEL
Cholesterol: 139 mg/dL (ref 0–200)
HDL: 27 mg/dL — ABNORMAL LOW (ref >39)
LDL Cholesterol: 91 mg/dL (ref 0–99)
Total CHOL/HDL Ratio: 5.1 Ratio
Triglycerides: 107 mg/dL (ref <150)
VLDL: 21 mg/dL (ref 0–40)

## 2013-10-06 MED ORDER — HYDROCOD POLST-CHLORPHEN POLST 10-8 MG/5ML PO LQCR: 5.0000 mL | Freq: Two times a day (BID) | ORAL | Status: DC | PRN

## 2013-10-06 NOTE — Assessment & Plan Note (Signed)
He is doing well on stribild. He is offered/refuses condoms. He gets flu shot today.

## 2013-10-06 NOTE — Progress Notes (Signed)
   Subjective:    Patient ID: Jacob Ali, male    DOB: Feb 01, 1960, 53 y.o.   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  Was in hospital 02-20-11 after tylenol overdose and cutting his wrists.  Has been on lopid, lisinopril. Tests his BP weekly at work and has been good.  Has been taking omeprazole for GERD which he has had since childhood. Has never seen GI.  On deferral arm of START study until July 1.   Last CD4 683 and VL 12,146 on June 23, 2013.   Today is seen as walk in for sinus stuffiness. He was given z-pack, bromidine at work without relief. Had chills last week, stayed home from work for 3 days. Unclear if he had fever. Bromidine gave him headache and feeling hot. Feels like he has sinus drainage, stuck in chest and he cannot get out. Feels worn down.  Has diarrhea now, attributes to z-pack.  He was started on stribild on July 1.   Review of Systems  Constitutional: Positive for chills and fatigue. Negative for fever.  HENT: Positive for postnasal drip.   Respiratory: Positive for cough.   Gastrointestinal: Positive for diarrhea. Negative for constipation.       Objective:   Physical Exam  Constitutional: He appears well-developed and well-nourished.  HENT:  Mouth/Throat: No oropharyngeal exudate.  Eyes: EOM are normal. Pupils are equal, round, and reactive to light.  Neck: Neck supple.  Cardiovascular: Normal rate, regular rhythm and normal heart sounds.   Pulmonary/Chest: Effort normal and breath sounds normal.  Abdominal: Soft. Bowel sounds are normal. He exhibits no distension. There is no tenderness.  Lymphadenopathy:    He has no cervical adenopathy.          Assessment & Plan:

## 2013-10-06 NOTE — Assessment & Plan Note (Signed)
Will give him rx for tussionex. He will try claritin, flonase. Limit flonase use to 1 week.

## 2013-10-06 NOTE — Progress Notes (Signed)
Jacob Ali is here for START study, month 36. He states that he has had no side effects from [facility] and is pleased with this medication. He is eager to find out his VL. He has had a "head cold" and was treated with z-pak prescribed by PA at his work. He also took Bromadrine for his congestion but stated this did not work. Currently he is tight and congested in his bronchioles and he is requesting to see Dr. Johnnye Sima. Positive for wheezes and crackles in bronchioles; positive for sputum. He was able to work him into his schedule and will see him today for this. No denies any other complaints. Fasting labs were obtained. Vital signs are stable and within normal limits. Flu vaccine administered after blood draw. EKG performed and reads NSR. Study medication was dispensed and he received $20 gift card for visit. Next appointment scheduled for Friday, April 01, 2014 @ 3pm. Eliezer Champagne RN

## 2013-10-07 LAB — HIV-1 RNA QUANT-NO REFLEX-BLD
HIV 1 RNA Quant: <20 copies/mL (ref <20)
HIV-1 RNA Quant, Log: <1.3 {Log} (ref <1.30)

## 2013-10-18 ENCOUNTER — Ambulatory Visit (INDEPENDENT_AMBULATORY_CARE_PROVIDER_SITE_OTHER): Payer: PRIVATE HEALTH INSURANCE | Admitting: Infectious Diseases

## 2013-10-18 ENCOUNTER — Encounter: Payer: Self-pay | Admitting: Infectious Diseases

## 2013-10-18 VITALS — BP 167/107 | HR 80 | Temp 98.2°F | Wt 173.0 lb

## 2013-10-18 DIAGNOSIS — I10 Essential (primary) hypertension: Secondary | ICD-10-CM

## 2013-10-18 DIAGNOSIS — Z79899 Other long term (current) drug therapy: Secondary | ICD-10-CM

## 2013-10-18 DIAGNOSIS — Z113 Encounter for screening for infections with a predominantly sexual mode of transmission: Secondary | ICD-10-CM

## 2013-10-18 DIAGNOSIS — Z21 Asymptomatic human immunodeficiency virus [HIV] infection status: Secondary | ICD-10-CM

## 2013-10-18 DIAGNOSIS — B20 Human immunodeficiency virus [HIV] disease: Secondary | ICD-10-CM

## 2013-10-18 DIAGNOSIS — K219 Gastro-esophageal reflux disease without esophagitis: Secondary | ICD-10-CM

## 2013-10-18 NOTE — Assessment & Plan Note (Signed)
Continues on prilosec, f/u with GI prn.

## 2013-10-18 NOTE — Progress Notes (Signed)
   Subjective:    Patient ID: Jacob Ali, male    DOB: October 07, 1960, 53 y.o.   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  Was in hospital 02-20-11 after tylenol overdose and cutting his wrists.  Has been on lopid, lisinopril. Tests his BP weekly at work and has been good.  Has been taking omeprazole for GERD which he has had since childhood. Was seen by GI and told he did not have Barrett's. Has had colonoscopy as well.  On deferral arm of START study until July 14, 2013 when he was started on stribild. Has had some occas diarrhea. Last CD4 683 and VL 12,146 on June 23, 2013.  Repeat CD4 623 (10-06-13)  HIV 1 RNA Quant (copies/mL)  Date Value  10/06/2013 <20   06/23/2013 12146*  04/27/2013 13469*     CD4 (no units)  Date Value  06/23/2013 683   04/27/2013 629   10/27/2012 583      CD4 T Cell Abs (cmm)  Date Value  10/16/2010 1020     Review of Systems  Constitutional: Positive for fatigue. Negative for appetite change and unexpected weight change.  Respiratory: Negative for chest tightness and shortness of breath.   Cardiovascular: Negative for chest pain.  Gastrointestinal: Negative for diarrhea and constipation.  Genitourinary: Negative for difficulty urinating.  Neurological: Negative for headaches.       Objective:   Physical Exam  Constitutional: He appears well-developed and well-nourished.  HENT:  Mouth/Throat: No oropharyngeal exudate.  Eyes: EOM are normal. Pupils are equal, round, and reactive to light.  Neck: Neck supple.  Cardiovascular: Normal rate and regular rhythm.   Pulmonary/Chest: Effort normal and breath sounds normal.  Abdominal: Soft. Bowel sounds are normal. There is no tenderness. There is no rebound.  Lymphadenopathy:    He has no cervical adenopathy.          Assessment & Plan:

## 2013-10-18 NOTE — Assessment & Plan Note (Signed)
He is doing well on stribild. He is given flu shot. Offered/refuses condoms.  Will see him back in 6 months.

## 2013-10-18 NOTE — Assessment & Plan Note (Signed)
He is going home to take his BP rx right now

## 2013-10-22 ENCOUNTER — Encounter: Payer: Self-pay | Admitting: Infectious Diseases

## 2013-10-22 LAB — CD4/CD8 (T-HELPER/T-SUPPRESSOR CELL)
CD4%: 28.3
CD4: 623
CD8 % Suppressor T Cell: 60.2
CD8: 1324

## 2013-12-31 ENCOUNTER — Encounter: Payer: Self-pay | Admitting: Infectious Diseases

## 2014-01-03 ENCOUNTER — Encounter: Payer: Self-pay | Admitting: Infectious Diseases

## 2014-03-22 ENCOUNTER — Telehealth: Payer: Self-pay | Admitting: *Deleted

## 2014-03-22 NOTE — Telephone Encounter (Signed)
Remo Lipps called wanting to get an appointment with Dr. Johnnye Sima as soon as possible. 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 He said it affects his hands, his back, allergies, etc. There are no appts for Dr. Johnnye Sima available until next month, and the first appointments for clinic doctors are not for at least 2 weeks. I did offer the next available appointment  to him.I told him I didn't have the authority to [facility] but he could try calling back tomorrow and talking with someone in triage about an appointment.

## 2014-04-01 ENCOUNTER — Ambulatory Visit (INDEPENDENT_AMBULATORY_CARE_PROVIDER_SITE_OTHER): Payer: PRIVATE HEALTH INSURANCE | Admitting: *Deleted

## 2014-04-01 VITALS — BP 128/80 | HR 76 | Temp 98.2°F | Resp 16 | Wt 177.5 lb

## 2014-04-01 DIAGNOSIS — Z006 Encounter for examination for normal comparison and control in clinical research program: Secondary | ICD-10-CM

## 2014-04-01 NOTE — Progress Notes (Signed)
Jacob Ali is here for START study, month 40. We reviewed updated consents and study letters to participants. I answered questions. He verbalized understanding and signed consent witnessed by me. Signed copies of letters and consents were given to participant. Vital signs are stable and blood drawn with no problems. He received $20 gift card for visit. Study meds were dispensed. Next appointment scheduled for September 30, 2014 @ 8:30am. Eliezer Champagne RN

## 2014-04-04 ENCOUNTER — Encounter: Payer: Self-pay | Admitting: Infectious Diseases

## 2014-04-04 ENCOUNTER — Telehealth: Payer: Self-pay | Admitting: *Deleted

## 2014-04-04 NOTE — Telephone Encounter (Signed)
Patient sent an email asking about FMLA paperwork that he said he brought by 2 weeks ago. Responded to him and explained that Dr. Johnnye Sima has not been clinic MD this month and therefore only here 1-2 days per week. Note forwarded to Dr. Johnnye Sima and patient advised that he is next in clinic on 04/06/14. Also suggested that next time he schedule an appointment for this paperwork to be filled out. Myrtis Hopping

## 2014-04-05 LAB — HIV-1 RNA QUANT-NO REFLEX-BLD
HIV 1 RNA Quant: <20 copies/mL (ref <20)
HIV-1 RNA Quant, Log: <1.3 {Log} (ref <1.30)

## 2014-04-06 ENCOUNTER — Telehealth: Payer: Self-pay | Admitting: *Deleted

## 2014-04-06 NOTE — Telephone Encounter (Signed)
Patient dropped off paperwork for FMLA 2 weeks ago and sent an email to enquire if it was ready. Per Dr. Johnnye Sima he filled it out to the best of his ability and patient should review it prior to giving it to his employer. Patient gave me a secure fax # [phone].  and this was sent. Also advised patient via email and phone call that per Dr. Johnnye Sima any future work or personal forms; he should schedule an office visit to fill out. This will make it clear as to what the patient is ok with revealing on the forms regarding his personal medical record. Copies made and placed up front for scan. Myrtis Hopping

## 2014-04-10 ENCOUNTER — Encounter (HOSPITAL_COMMUNITY): Payer: Self-pay | Admitting: Emergency Medicine

## 2014-04-10 ENCOUNTER — Emergency Department (HOSPITAL_COMMUNITY)
Admission: EM | Admit: 2014-04-10 | Discharge: 2014-04-10 | Disposition: A | Discharge status: Nursing Home (Includes ICF) | Payer: PRIVATE HEALTH INSURANCE | Source: Home / Self Care | Attending: Family Medicine | Admitting: Family Medicine

## 2014-04-10 ENCOUNTER — Encounter (HOSPITAL_COMMUNITY): Payer: Self-pay

## 2014-04-10 ENCOUNTER — Emergency Department (HOSPITAL_COMMUNITY)
Admission: EM | Admit: 2014-04-10 | Discharge: 2014-04-10 | Disposition: A | Discharge status: Home / Self Care | Payer: PRIVATE HEALTH INSURANCE | Attending: Emergency Medicine | Admitting: Emergency Medicine

## 2014-04-10 ENCOUNTER — Emergency Department (HOSPITAL_COMMUNITY): Payer: PRIVATE HEALTH INSURANCE

## 2014-04-10 DIAGNOSIS — N451 Epididymitis: Secondary | ICD-10-CM | POA: Diagnosis not present

## 2014-04-10 DIAGNOSIS — I10 Essential (primary) hypertension: Secondary | ICD-10-CM | POA: Diagnosis not present

## 2014-04-10 DIAGNOSIS — N508 Other specified disorders of male genital organs: Secondary | ICD-10-CM

## 2014-04-10 DIAGNOSIS — N5089 Other specified disorders of the male genital organs: Secondary | ICD-10-CM

## 2014-04-10 DIAGNOSIS — B20 Human immunodeficiency virus [HIV] disease: Secondary | ICD-10-CM | POA: Diagnosis not present

## 2014-04-10 DIAGNOSIS — R1909 Other intra-abdominal and pelvic swelling, mass and lump: Secondary | ICD-10-CM | POA: Diagnosis present

## 2014-04-10 DIAGNOSIS — Z720 Tobacco use: Secondary | ICD-10-CM | POA: Diagnosis not present

## 2014-04-10 DIAGNOSIS — Z8739 Personal history of other diseases of the musculoskeletal system and connective tissue: Secondary | ICD-10-CM | POA: Insufficient documentation

## 2014-04-10 DIAGNOSIS — Z79899 Other long term (current) drug therapy: Secondary | ICD-10-CM | POA: Insufficient documentation

## 2014-04-10 LAB — URINALYSIS, ROUTINE W REFLEX MICROSCOPIC
Bilirubin Urine: NEGATIVE
Glucose, UA: NEGATIVE mg/dL
Hgb urine dipstick: NEGATIVE
Ketones, ur: NEGATIVE mg/dL
Nitrite: NEGATIVE
Protein, ur: NEGATIVE mg/dL
Specific Gravity, Urine: 1.017 (ref 1.005–1.030)
Urobilinogen, UA: 0.2 mg/dL (ref 0.0–1.0)
pH: 6 (ref 5.0–8.0)

## 2014-04-10 LAB — URINE MICROSCOPIC-ADD ON

## 2014-04-10 MED ORDER — AZITHROMYCIN 250 MG PO TABS
1000.0000 mg | ORAL_TABLET | Freq: Once | ORAL | Status: AC
  Administered 2014-04-10: 1000 mg via ORAL
  Filled 2014-04-10: qty 4

## 2014-04-10 MED ORDER — CEFTRIAXONE SODIUM 250 MG IJ SOLR
250.0000 mg | Freq: Once | INTRAMUSCULAR | Status: AC
  Administered 2014-04-10: 250 mg via INTRAMUSCULAR
  Filled 2014-04-10: qty 250

## 2014-04-10 MED ORDER — LIDOCAINE HCL (PF) 1 % IJ SOLN
5.0000 mL | Freq: Once | INTRAMUSCULAR | Status: AC
  Administered 2014-04-10: 5 mL
  Filled 2014-04-10: qty 5

## 2014-04-10 MED ORDER — NAPROXEN 500 MG PO TABS: 500.0000 mg | ORAL_TABLET | Freq: Two times a day (BID) | ORAL | Status: DC

## 2014-04-10 MED ORDER — ONDANSETRON 4 MG PO TBDP
4.0000 mg | ORAL_TABLET | Freq: Once | ORAL | Status: AC
  Administered 2014-04-10: 4 mg via ORAL
  Filled 2014-04-10: qty 1

## 2014-04-10 MED ORDER — AZITHROMYCIN 1 G PO PACK: 1.0000 g | PACK | Freq: Once | ORAL | Status: DC

## 2014-04-10 MED ORDER — HYDROCODONE-ACETAMINOPHEN 5-325 MG PO TABS
2.0000 | ORAL_TABLET | Freq: Once | ORAL | Status: AC
  Administered 2014-04-10: 2 via ORAL
  Filled 2014-04-10: qty 2

## 2014-04-10 MED ORDER — LEVOFLOXACIN 500 MG PO TABS: 500.0000 mg | ORAL_TABLET | Freq: Two times a day (BID) | ORAL | Status: DC

## 2014-04-10 MED ORDER — TRAMADOL HCL 50 MG PO TABS: 50.0000 mg | ORAL_TABLET | Freq: Four times a day (QID) | ORAL | Status: DC | PRN

## 2014-04-10 NOTE — ED Provider Notes (Signed)
Jacob Ali is a 54 y.o. male who presents to Urgent Care today for left testicle swelling and pain. Patient awoke yesterday with swelling and pain of his left testicle. Symptoms have worsened since. Preceding this he notes some urinary frequency and urgency and dysuria. The symptoms are consistent with prior episodes of prostatitis. He denies any fevers or chills nausea vomiting or diarrhea. Additionally he notes that last week his dog jumped on him and hit him right in the testicles. He didn't have much pain after few minutes and this occurred multiple days before his current symptoms.   Past Medical History  Diagnosis Date  . HIV infection   . Hyperlipidemia   . Hypertension   . PONV (postoperative nausea and vomiting)    Past Surgical History  Procedure Laterality Date  . Examination under anesthesia, repair of anal fissure,  12/04/2000   History  Substance Use Topics  . Smoking status: Current Every Day Smoker -- 0.75 packs/day for 40 years    Types: Cigarettes  . Smokeless tobacco: Never Used  . Alcohol Use: 0.5 oz/week    1 drink(s) per week     Comment: beer occasional   ROS as above Medications: No current facility-administered medications for this encounter.   Current Outpatient Prescriptions  Medication Sig Dispense Refill  . elvitegravir-cobicistat-emtricitabine-tenofovir (STRIBILD) 150-150-200-300 MG TABS tablet Take 1 tablet by mouth daily with breakfast. 90 tablet 6  . gemfibrozil (LOPID) 600 MG tablet Take 1 tablet (600 mg total) by mouth daily. To help lower cholesterol. 30 tablet 0  . lisinopril-hydrochlorothiazide (PRINZIDE,ZESTORETIC) 20-12.5 MG per tablet Take 1 tablet by mouth daily. For control of high blood pressure 30 tablet 0  . omeprazole (PRILOSEC) 20 MG capsule Take 20 mg by mouth daily.    . chlorpheniramine-HYDROcodone (TUSSIONEX PENNKINETIC ER) 10-8 MG/5ML LQCR Take 5 mLs by mouth every 12 (twelve) hours as needed for cough. 100 mL 0  . traZODone  (DESYREL) 150 MG tablet Take 1 tablet (150 mg total) by mouth at bedtime as needed for sleep. 20 tablet 0  . VIAGRA 25 MG tablet TAKE 1 TABLET BY MOUTH DAILY AS NEEDED FOR ERECTILE DYSFUNCTION 10 tablet 0   No Known Allergies   Exam:  BP 149/93 mmHg  Pulse 96  Temp(Src) 99.1 F (37.3 C)  SpO2 96% Gen: Well NAD Genitals: No inguinal lymphadenopathy or swelling. Right testicle is normal to palpation and distended without tenderness or masses. There is a large single tender mass in the left scrotum. No obvious herniation present on palpated exam Penis is circumcised and normal appearing without discharge  No results found for this or any previous visit (from the past 24 hour(s)). No results found.  Assessment and Plan: 54 y.o. male with left testicle swelling. Unclear etiology. Transfer to ED for further evaluation and management. Testicular torsion as a possibility. We are unable to perform an ultrasound at this location.  Discussed warning signs or symptoms. Please see discharge instructions. Patient expresses understanding.     Gregor Hams, MD 04/10/14 [phone]

## 2014-04-10 NOTE — ED Notes (Signed)
Reports dog jumping down on left groin area prior to 3/24.  Pt is having increased pain and swelling of the left testicle.  States gradually getting worse.

## 2014-04-10 NOTE — ED Provider Notes (Signed)
CSN: [id number]     Arrival date & time 04/10/14  0258 History   First MD Initiated Contact with Patient 04/10/14 [phone]     Chief Complaint  Patient presents with  . Groin Swelling     (Consider location/radiation/quality/duration/timing/severity/associated sxs/prior Treatment) HPI  ARVIL UTZ Is a 54 year old male with HIV who presents via emergency Department with chief complaint of left testicle pain and swelling. Patient awoke yesterday with some aching pain in left testicle. He also noticed some dysuria, frequency and urgency. He denies any rectal pain. He states it was similar to previous urinary tract infections. He denies any myalgias, arthralgias, or other symptoms of systemic infection. He denies any penile discharge or recent unprotected sexual intercourse. The patient states that over the past 12 hours he has had significant weeping, worsening pain, swelling, heat and redness to the left testicle. He rates the pain as moderate to severe. He has never had anything like this before. Patient is followed by Dr. Bobby Rumpf.Last CD4 count was 9 months ago andwas 698. Last viral quantitative measurement was undetectable 6 months ago. He states that he is compliant with his HIV medications.  Past Medical History  Diagnosis Date  . HIV infection   . Hyperlipidemia   . Hypertension   . PONV (postoperative nausea and vomiting)    Past Surgical History  Procedure Laterality Date  . Examination under anesthesia, repair of anal fissure,  12/04/2000   Family History  Problem Relation Age of Onset  . Throat cancer Father   . Heart disease Father   . Cancer Maternal Grandmother     colon   History  Substance Use Topics  . Smoking status: Current Every Day Smoker -- 0.75 packs/day for 40 years    Types: Cigarettes  . Smokeless tobacco: Never Used  . Alcohol Use: 0.5 oz/week    1 drink(s) per week     Comment: beer occasional    Review of Systems Ten systems reviewed and  are negative for acute change, except as noted in the HPI.     Allergies  Review of patient's allergies indicates no known allergies.  Home Medications   Prior to Admission medications   Medication Sig Start Date End Date Taking? Authorizing Provider  chlorpheniramine-HYDROcodone (TUSSIONEX PENNKINETIC ER) 10-8 MG/5ML LQCR Take 5 mLs by mouth every 12 (twelve) hours as needed for cough. 10/06/13   Campbell Riches, MD  elvitegravir-cobicistat-emtricitabine-tenofovir (STRIBILD) 150-150-200-300 MG TABS tablet Take 1 tablet by mouth daily with breakfast. 06/23/13   Campbell Riches, MD  gemfibrozil (LOPID) 600 MG tablet Take 1 tablet (600 mg total) by mouth daily. To help lower cholesterol. 02/27/11   Darrol Jump, MD  lisinopril-hydrochlorothiazide (PRINZIDE,ZESTORETIC) 20-12.5 MG per tablet Take 1 tablet by mouth daily. For control of high blood pressure 02/27/11   Darrol Jump, MD  omeprazole (PRILOSEC) 20 MG capsule Take 20 mg by mouth daily.    Historical Provider, MD  traZODone (DESYREL) 150 MG tablet Take 1 tablet (150 mg total) by mouth at bedtime as needed for sleep. 04/15/12 10/27/12  Campbell Riches, MD  VIAGRA 25 MG tablet TAKE 1 TABLET BY MOUTH DAILY AS NEEDED FOR ERECTILE DYSFUNCTION 03/29/13   Campbell Riches, MD   BP 131/67 mmHg  Pulse 93  Temp(Src) 98.1 F (36.7 C) (Oral)  Resp 18  Ht 5\' 9"  (1.753 m)  Wt 175 lb (79.379 kg)  BMI 25.83 kg/m2  SpO2 93% Physical Exam  Constitutional: He appears  well-developed and well-nourished. No distress.  HENT:  Head: Normocephalic and atraumatic.  Eyes: Conjunctivae are normal. No scleral icterus.  Neck: Normal range of motion. Neck supple.  Cardiovascular: Normal rate, regular rhythm and normal heart sounds.   Pulmonary/Chest: Effort normal and breath sounds normal. No respiratory distress.  Abdominal: Soft. There is no tenderness.  Genitourinary: Penis normal. Cremasteric reflex is present. Right testis shows no tenderness.  Left testis shows swelling and tenderness. Circumcised. No penile tenderness. No discharge found.  Musculoskeletal: He exhibits no edema.  Neurological: He is alert.  Skin: Skin is warm and dry. He is not diaphoretic.  Psychiatric: His behavior is normal.  Nursing note and vitals reviewed.   ED Course  Procedures (including critical care time) Labs Review Labs Reviewed  URINALYSIS, ROUTINE W REFLEX MICROSCOPIC    Imaging Review No results found.   EKG Interpretation None      MDM   Final diagnoses:  Testicular swelling, left   12:47 PM BP 129/73 mmHg  Pulse 85  Temp(Src) 98.1 F (36.7 C) (Oral)  Resp 18  Ht 5\' 9"  (1.753 m)  Wt 175 lb (79.379 kg)  BMI 25.83 kg/m2  SpO2 94% Patient with confirmed epididymitis  Treated here with rocephin and azithromycin. D/C with 10 Levaquin 500 BID. [facility] with [facility].  appears safe for discharge at this time.     Margarita Mail, PA-C 04/10/14 1252  Ezequiel Essex, MD 04/10/14 [phone]

## 2014-04-10 NOTE — ED Notes (Signed)
Pt sent from urgent care.  Pt reports that Thursday night he noticed some discomfort with urination and some frequency.  Pt reports that yesterday he noticed swelling to his left testicle.  Pt reports it has doubled in size and has become more painful over the past day.  Pt sts that urgent care "ruled out a hernia".  Pt reports he has been doing housework so its possible he could have pulled something.

## 2014-04-10 NOTE — Discharge Instructions (Signed)
Epididymitis °Epididymitis is a swelling (inflammation) of the epididymis. The epididymis is a cord-like structure along the back part of the testicle. Epididymitis is usually, but not always, caused by infection. This is usually a sudden problem beginning with chills, fever and pain behind the scrotum and in the testicle. There may be swelling and redness of the testicle. °DIAGNOSIS  °Physical examination will reveal a tender, swollen epididymis. Sometimes, cultures are obtained from the urine or from prostate secretions to help find out if there is an infection or if the cause is a different problem. Sometimes, blood work is performed to see if your white blood cell count is elevated and if a germ (bacterial) or viral infection is present. Using this knowledge, an appropriate medicine which kills germs (antibiotic) can be chosen by your caregiver. A viral infection causing epididymitis will most often go away (resolve) without treatment. °HOME CARE INSTRUCTIONS  °· Hot sitz baths for 20 minutes, 4 times per day, may help relieve pain. °· Only take over-the-counter or prescription medicines for pain, discomfort or fever as directed by your caregiver. °· Take all medicines, including antibiotics, as directed. Take the antibiotics for the full prescribed length of time even if you are feeling better. °· It is very important to keep all follow-up appointments. °SEEK IMMEDIATE MEDICAL CARE IF:  °· You have a fever. °· You have pain not relieved with medicines. °· You have any worsening of your problems. °· Your pain seems to come and go. °· You develop pain, redness, and swelling in the scrotum and surrounding areas. °MAKE SURE YOU:  °· Understand these instructions. °· Will watch your condition. °· Will get help right away if you are not doing well or get worse. °Document Released: 12/29/1999 Document Revised: 03/25/2011 Document Reviewed: 11/17/2008 °ExitCare® Patient Information ©2015 [employer]. This information  is not intended to replace advice given to you by your health care provider. Make sure you discuss any questions you have with your health care provider. ° °

## 2014-04-10 NOTE — ED Notes (Signed)
Patient is unable to give a urine specimen at this time  

## 2014-04-13 ENCOUNTER — Telehealth: Payer: Self-pay | Admitting: *Deleted

## 2014-04-13 ENCOUNTER — Encounter: Payer: Self-pay | Admitting: Infectious Diseases

## 2014-04-13 LAB — CD4/CD8 (T-HELPER/T-SUPPRESSOR CELL)
CD4%: 30.6
CD4: 1010
CD8 % Suppressor T Cell: 55.7
CD8: 1838

## 2014-04-13 NOTE — Telephone Encounter (Signed)
Called patient regarding his question about his FMLA paperwork.  Per Kennyth Lose, patient provided multiple incorrect fax numbers during multiple conversations on 3/23.  She received confirmation that the fax went through to [phone] before the patient called back and stated that was the wrong number.  Per Jackie's conversation with the patient, a copy of the forms were placed in the clinic's out-going mailbox on 3/23, addressed to the patient's home.  The originals were sent for scanning into the patient's chart.  Patient emailed today stating he has not yet received the fax nor the mailed paperwork.  RN advised patient in a voicemail of the steps taken to get him his paperwork, advised that the forms would potentially be delivered to his home address today.   Per Jamey Ripa, if the patient needs the documentation today, he is welcome to come to the clinic and pick it up. Landis Gandy, RN

## 2014-04-25 ENCOUNTER — Ambulatory Visit: Payer: Self-pay | Admitting: Infectious Diseases

## 2014-05-05 ENCOUNTER — Encounter: Payer: Self-pay | Admitting: Infectious Diseases

## 2014-05-05 ENCOUNTER — Ambulatory Visit (INDEPENDENT_AMBULATORY_CARE_PROVIDER_SITE_OTHER): Payer: PRIVATE HEALTH INSURANCE | Admitting: Infectious Diseases

## 2014-05-05 VITALS — BP 155/83 | HR 87 | Temp 98.5°F | Ht 69.0 in | Wt 180.0 lb

## 2014-05-05 DIAGNOSIS — F17200 Nicotine dependence, unspecified, uncomplicated: Secondary | ICD-10-CM

## 2014-05-05 DIAGNOSIS — N451 Epididymitis: Secondary | ICD-10-CM

## 2014-05-05 DIAGNOSIS — Z720 Tobacco use: Secondary | ICD-10-CM

## 2014-05-05 DIAGNOSIS — Z21 Asymptomatic human immunodeficiency virus [HIV] infection status: Secondary | ICD-10-CM | POA: Diagnosis not present

## 2014-05-05 DIAGNOSIS — B20 Human immunodeficiency virus [HIV] disease: Secondary | ICD-10-CM

## 2014-05-05 DIAGNOSIS — Z113 Encounter for screening for infections with a predominantly sexual mode of transmission: Secondary | ICD-10-CM

## 2014-05-05 DIAGNOSIS — N521 Erectile dysfunction due to diseases classified elsewhere: Secondary | ICD-10-CM

## 2014-05-05 DIAGNOSIS — T1491XA Suicide attempt, initial encounter: Secondary | ICD-10-CM

## 2014-05-05 DIAGNOSIS — T1491 Suicide attempt: Secondary | ICD-10-CM

## 2014-05-05 DIAGNOSIS — I10 Essential (primary) hypertension: Secondary | ICD-10-CM

## 2014-05-05 DIAGNOSIS — Z79899 Other long term (current) drug therapy: Secondary | ICD-10-CM

## 2014-05-05 MED ORDER — SILDENAFIL CITRATE 25 MG PO TABS: 25.0000 mg | ORAL_TABLET | ORAL | Status: DC | PRN

## 2014-05-05 NOTE — Assessment & Plan Note (Signed)
Not interested in quiting.

## 2014-05-05 NOTE — Addendum Note (Signed)
Addended by: Landis Gandy on: 05/05/2014 05:11 PM   Modules accepted: Orders

## 2014-05-05 NOTE — Assessment & Plan Note (Signed)
Will recheck his UA, gc/c, rpr.  This does not appear to have been done prior.

## 2014-05-05 NOTE — Assessment & Plan Note (Signed)
He denies any further ideation.

## 2014-05-05 NOTE — Assessment & Plan Note (Signed)
He is doing very well.  Will cont his stribild via [facility] until [facility] has genvoya.  He has condoms.  His FMLA form is filled out.  rtc 6 months

## 2014-05-05 NOTE — Progress Notes (Signed)
   Subjective:    Patient ID: Jacob Ali, male    DOB: [birth year], 54 y.o.   MRN: [id number]  HPI 54 yo M with hx of HTN, hyperlipidemia came to ID clinic after new HIV+ September 2012.  Was in hospital 02-20-11 after tylenol overdose and cutting his wrists.  Has been on lopid, lisinopril. Tests his BP weekly at work and has been good.  Has been taking omeprazole for GERD which he has had since childhood. Was seen by GI and told he did not have Barrett's. Has had colonoscopy as well.  On deferral arm of START study until July 14, 2013 when he was started on stribild. Has been having bouts of diarrhea, especially since he was recently tx with anbx for epididymitis.  Still has episodes of fatigue, body aches, depression.   HIV 1 RNA QUANT (copies/mL)  Date Value  04/01/2014 <20  10/06/2013 <20  06/23/2013 12146*   CD4 (no units)  Date Value  04/01/2014 1010  10/06/2013 623  06/23/2013 683   CD4 T CELL ABS (cmm)  Date Value  10/16/2010 1020   Review of Systems  Constitutional: Positive for fatigue. Negative for fever, chills, appetite change and unexpected weight change.  Gastrointestinal: Positive for diarrhea.  would like a new GP     Objective:   Physical Exam  Constitutional: He appears well-developed and well-nourished.  HENT:  Mouth/Throat: No oropharyngeal exudate.  Eyes: EOM are normal. Pupils are equal, round, and reactive to light.  Neck: Neck supple.  Cardiovascular: Normal rate, regular rhythm and normal heart sounds.   Pulmonary/Chest: Effort normal and breath sounds normal.  Abdominal: Soft. Bowel sounds are normal. He exhibits no distension. There is no tenderness.  Lymphadenopathy:    He has no cervical adenopathy.      Assessment & Plan:

## 2014-05-05 NOTE — Addendum Note (Signed)
Addended by: Yacqub Baston C on: 05/05/2014 12:16 PM   Modules accepted: Orders

## 2014-05-05 NOTE — Assessment & Plan Note (Signed)
Will refer him to PCP

## 2014-05-10 ENCOUNTER — Telehealth: Payer: Self-pay | Admitting: *Deleted

## 2014-05-10 NOTE — Telephone Encounter (Signed)
Left message asking when the patient wanted to return for a lab visit (was unable to void for STD screening, needs RPR).  Patient was also setting up PCP care, RN inquired if he needed a referral for that process. Landis Gandy, RN

## 2014-05-25 ENCOUNTER — Other Ambulatory Visit: Payer: PRIVATE HEALTH INSURANCE

## 2014-05-25 DIAGNOSIS — Z21 Asymptomatic human immunodeficiency virus [HIV] infection status: Secondary | ICD-10-CM

## 2014-05-25 DIAGNOSIS — N451 Epididymitis: Secondary | ICD-10-CM

## 2014-05-25 DIAGNOSIS — B20 Human immunodeficiency virus [HIV] disease: Secondary | ICD-10-CM

## 2014-05-25 DIAGNOSIS — Z113 Encounter for screening for infections with a predominantly sexual mode of transmission: Secondary | ICD-10-CM

## 2014-05-26 LAB — URINALYSIS, ROUTINE W REFLEX MICROSCOPIC
Bilirubin Urine: NEGATIVE
Glucose, UA: NEGATIVE mg/dL
Hgb urine dipstick: NEGATIVE
Ketones, ur: NEGATIVE mg/dL
Leukocytes, UA: NEGATIVE
Nitrite: NEGATIVE
Protein, ur: NEGATIVE mg/dL
Specific Gravity, Urine: 1.022 (ref 1.005–1.030)
Urobilinogen, UA: 0.2 mg/dL (ref 0.0–1.0)
pH: 5.5 (ref 5.0–8.0)

## 2014-05-26 LAB — URINE CYTOLOGY ANCILLARY ONLY
Chlamydia: NEGATIVE
Neisseria Gonorrhea: NEGATIVE

## 2014-09-30 ENCOUNTER — Encounter (INDEPENDENT_AMBULATORY_CARE_PROVIDER_SITE_OTHER): Payer: PRIVATE HEALTH INSURANCE | Admitting: *Deleted

## 2014-09-30 VITALS — BP 154/92 | HR 87 | Temp 98.4°F | Resp 16 | Wt 187.8 lb

## 2014-09-30 DIAGNOSIS — Z006 Encounter for examination for normal comparison and control in clinical research program: Secondary | ICD-10-CM

## 2014-09-30 LAB — POCT URINALYSIS DIPSTICK
Bilirubin, UA: NEGATIVE
Blood, UA: NEGATIVE
Glucose, UA: NEGATIVE
Ketones, UA: NEGATIVE
Leukocytes, UA: NEGATIVE
Nitrite, UA: NEGATIVE
Protein, UA: NEGATIVE
Spec Grav, UA: 1.025
Urobilinogen, UA: 0.2
pH, UA: 6.5

## 2014-09-30 LAB — LIPID PANEL
Cholesterol: 158 mg/dL (ref 125–200)
HDL: 25 mg/dL — ABNORMAL LOW (ref >=40)
LDL Cholesterol: 96 mg/dL (ref <130)
Total CHOL/HDL Ratio: 6.3 Ratio — ABNORMAL HIGH (ref <=5.0)
Triglycerides: 183 mg/dL — ABNORMAL HIGH (ref <150)
VLDL: 37 mg/dL — ABNORMAL HIGH (ref <30)

## 2014-09-30 LAB — COMPREHENSIVE METABOLIC PANEL
ALT: 28 U/L (ref 9–46)
AST: 22 U/L (ref 10–35)
Albumin: 4.8 g/dL (ref 3.6–5.1)
Alkaline Phosphatase: 45 U/L (ref 40–115)
BUN: 12 mg/dL (ref 7–25)
CO2: 26 mmol/L (ref 20–31)
Calcium: 9.5 mg/dL (ref 8.6–10.3)
Chloride: 103 mmol/L (ref 98–110)
Creat: 0.98 mg/dL (ref 0.70–1.33)
Glucose, Bld: 102 mg/dL — ABNORMAL HIGH (ref 65–99)
Potassium: 4.5 mmol/L (ref 3.5–5.3)
Sodium: 134 mmol/L — ABNORMAL LOW (ref 135–146)
Total Bilirubin: 1 mg/dL (ref 0.2–1.2)
Total Protein: 7.3 g/dL (ref 6.1–8.1)

## 2014-09-30 NOTE — Progress Notes (Signed)
Jacob Ali is here today for his annual START study visit at month 93. He denies any new current problem. He was treated for epididymitis in March and that has all resolved. He says Dr. Johnnye Sima was going to switch him to [facility] if we are able to get that through START study. I will need to check with the pharmacist. The study will end by the end of next year so his next visit will probably be the last time we are able to give him his medication. He plans to get a flu shot at work. I told him we needed to get an RPR on him, but he wanted to wait until his visit with Dr. Johnnye Sima to avoid any more co-pays. He scheduled an appt with Dr. Johnnye Sima in October and will see him for study in March of next year.

## 2014-10-03 LAB — HIV-1 RNA QUANT-NO REFLEX-BLD
HIV 1 RNA Quant: <20 copies/mL (ref <20)
HIV-1 RNA Quant, Log: <1.3 {Log} (ref <1.30)

## 2014-10-11 ENCOUNTER — Other Ambulatory Visit: Payer: Self-pay

## 2014-10-18 ENCOUNTER — Other Ambulatory Visit: Payer: Self-pay | Admitting: Family Medicine

## 2014-10-19 LAB — VITAMIN D 25 HYDROXY (VIT D DEFICIENCY, FRACTURES): Vit D, 25-Hydroxy: 30.2 ng/mL (ref 30.0–100.0)

## 2014-10-26 ENCOUNTER — Encounter: Payer: Self-pay | Admitting: Infectious Diseases

## 2014-10-26 ENCOUNTER — Ambulatory Visit (INDEPENDENT_AMBULATORY_CARE_PROVIDER_SITE_OTHER): Payer: PRIVATE HEALTH INSURANCE | Admitting: Infectious Diseases

## 2014-10-26 VITALS — BP 153/87 | HR 77 | Temp 97.5°F | Wt 191.0 lb

## 2014-10-26 DIAGNOSIS — B20 Human immunodeficiency virus [HIV] disease: Secondary | ICD-10-CM

## 2014-10-26 DIAGNOSIS — M2550 Pain in unspecified joint: Secondary | ICD-10-CM | POA: Diagnosis not present

## 2014-10-26 DIAGNOSIS — T1491 Suicide attempt: Secondary | ICD-10-CM

## 2014-10-26 DIAGNOSIS — Z21 Asymptomatic human immunodeficiency virus [HIV] infection status: Secondary | ICD-10-CM | POA: Diagnosis not present

## 2014-10-26 DIAGNOSIS — T1491XA Suicide attempt, initial encounter: Secondary | ICD-10-CM

## 2014-10-26 DIAGNOSIS — F17200 Nicotine dependence, unspecified, uncomplicated: Secondary | ICD-10-CM | POA: Diagnosis not present

## 2014-10-26 DIAGNOSIS — Z23 Encounter for immunization: Secondary | ICD-10-CM | POA: Diagnosis not present

## 2014-10-26 LAB — CD4/CD8 (T-HELPER/T-SUPPRESSOR CELL)
CD4%: 34
CD4: 952
CD8 % Suppressor T Cell: 53.4
CD8: 1495

## 2014-10-26 MED ORDER — IBUPROFEN 400 MG PO TABS: 400.0000 mg | ORAL_TABLET | Freq: Three times a day (TID) | ORAL | Status: DC | PRN

## 2014-10-26 NOTE — Assessment & Plan Note (Signed)
Anxiety improved.  Does not want to see counselor here, has available at work.

## 2014-10-26 NOTE — Assessment & Plan Note (Signed)
Encouraged him to quit 

## 2014-10-26 NOTE — Assessment & Plan Note (Signed)
Will write him prescription for motrin. Take with food! rtc prn

## 2014-10-26 NOTE — Assessment & Plan Note (Addendum)
Will continue his stribild.  Change to genvoya when available via [facility].  Not sexually active. Will give flu shot.   rtc in 6 months

## 2014-10-26 NOTE — Progress Notes (Signed)
   Subjective:    Patient ID: Jacob Ali, male    DOB: 1960/09/02, 54 y.o.   MRN: [id number]  HPI 54 yo M with hx of HTN, hyperlipidemia came to ID clinic after new HIV+ September 2012.  Was in hospital 02-20-11 after tylenol overdose and cutting his wrists.  Has been on lopid, lisinopril. Tests his BP weekly at work and has been good.  Has been taking omeprazole for GERD which he has had since childhood. Was seen by GI and told he did not have Barrett's. Has had colonoscopy as well.  On deferral arm of START study until July 14, 2013 when he was started on stribild.  Has been doing well. Still having some issues at work, flexing having to work with a chair (has back and hand pain). Has made him somewhat depressed as he is not working in his area of expertise.  Enjoys cooking outside of work.  Doesn't trust people, doesn't want to go outside of his house. States he is not lonely. Works with his friends. Feels like work is his family. Parents have had problems with their health.   HIV 1 RNA QUANT (copies/mL)  Date Value  09/30/2014 <20  04/01/2014 <20  10/06/2013 <20   CD4 (no units)  Date Value  09/30/2014 952  04/01/2014 1010  10/06/2013 623   CD4 T CELL ABS (cmm)  Date Value  10/16/2010 1020   Soch/FHx, reviewed in EPIC, updated.  Cont to smoke cigars.   Review of Systems  Constitutional: Negative for appetite change and unexpected weight change.  Gastrointestinal: Negative for diarrhea and constipation.  Genitourinary: Negative for difficulty urinating.  Musculoskeletal: Positive for back pain and arthralgias.  Psychiatric/Behavioral: Positive for dysphoric mood. Negative for sleep disturbance.  rarely takes trazodone for sleep.  Naprosyn offers little relief.     Objective:   Physical Exam  Constitutional: He appears well-developed and well-nourished.  Eyes: EOM are normal. Pupils are equal, round, and reactive to light.  Neck: Neck supple.  Cardiovascular:  Normal rate, regular rhythm and normal heart sounds.   Pulmonary/Chest: Effort normal and breath sounds normal.  Abdominal: Soft. Bowel sounds are normal. There is no tenderness. There is no rebound.  Lymphadenopathy:    He has no cervical adenopathy.  Psychiatric: He exhibits a depressed mood.       Assessment & Plan:

## 2014-10-31 ENCOUNTER — Ambulatory Visit: Payer: Self-pay

## 2014-10-31 DIAGNOSIS — M2550 Pain in unspecified joint: Secondary | ICD-10-CM

## 2014-10-31 MED ORDER — IBUPROFEN 400 MG PO TABS: 400.0000 mg | ORAL_TABLET | Freq: Three times a day (TID) | ORAL | Status: DC | PRN

## 2014-11-01 MED ORDER — IBUPROFEN 400 MG PO TABS: 400.0000 mg | ORAL_TABLET | Freq: Three times a day (TID) | ORAL | Status: DC | PRN

## 2014-11-01 NOTE — Progress Notes (Unsigned)
Refill request done. 

## 2014-12-01 ENCOUNTER — Encounter: Payer: Self-pay | Admitting: Infectious Diseases

## 2015-03-06 ENCOUNTER — Encounter: Payer: Self-pay | Admitting: Infectious Diseases

## 2015-03-06 ENCOUNTER — Ambulatory Visit (INDEPENDENT_AMBULATORY_CARE_PROVIDER_SITE_OTHER): Payer: PRIVATE HEALTH INSURANCE | Admitting: Infectious Diseases

## 2015-03-06 VITALS — BP 128/80 | HR 82 | Wt 185.0 lb

## 2015-03-06 DIAGNOSIS — F17200 Nicotine dependence, unspecified, uncomplicated: Secondary | ICD-10-CM | POA: Diagnosis not present

## 2015-03-06 DIAGNOSIS — I10 Essential (primary) hypertension: Secondary | ICD-10-CM

## 2015-03-06 DIAGNOSIS — Z21 Asymptomatic human immunodeficiency virus [HIV] infection status: Secondary | ICD-10-CM | POA: Diagnosis not present

## 2015-03-06 DIAGNOSIS — T1491 Suicide attempt: Secondary | ICD-10-CM | POA: Diagnosis not present

## 2015-03-06 DIAGNOSIS — E785 Hyperlipidemia, unspecified: Secondary | ICD-10-CM

## 2015-03-06 DIAGNOSIS — T1491XA Suicide attempt, initial encounter: Secondary | ICD-10-CM

## 2015-03-06 DIAGNOSIS — B20 Human immunodeficiency virus [HIV] disease: Secondary | ICD-10-CM

## 2015-03-06 MED ORDER — GEMFIBROZIL 600 MG PO TABS: 600.0000 mg | ORAL_TABLET | Freq: Every day | ORAL | Status: DC

## 2015-03-06 MED ORDER — TRAMADOL HCL 50 MG PO TABS: 50.0000 mg | ORAL_TABLET | Freq: Four times a day (QID) | ORAL | Status: DC | PRN

## 2015-03-06 MED ORDER — OMEPRAZOLE 20 MG PO CPDR: 20.0000 mg | DELAYED_RELEASE_CAPSULE | Freq: Every day | ORAL | Status: DC

## 2015-03-06 MED ORDER — LISINOPRIL-HYDROCHLOROTHIAZIDE 20-25 MG PO TABS: 1.0000 | ORAL_TABLET | Freq: Every day | ORAL | Status: DC

## 2015-03-06 NOTE — Progress Notes (Signed)
   Subjective:    Patient ID: Jacob Ali, male    DOB: January 19, 1960, 55 y.o.   MRN: [id number]  HPI 55 yo M with hx of HTN, hyperlipidemia and HIV+ September 2012.  Was in hospital 02-20-11 after tylenol overdose and cutting his wrists.  Has been taking omeprazole for GERD which he has had since childhood. Was seen by GI and told he did not have Barrett's. Has had colonoscopy as well.  On deferral arm of START study until July 14, 2013 when he was started on stribild.  HIV 1 RNA QUANT (copies/mL)  Date Value  09/30/2014 <20  04/01/2014 <20  10/06/2013 <20   CD4 (no units)  Date Value  09/30/2014 952  04/01/2014 1010  10/06/2013 623   CD4 T CELL ABS (cmm)  Date Value  10/16/2010 1020     Has been feeling well. Has some "aches and pains". Mostly fingers, L elbow. Occas relief with 800mg  ibuprofen. Had prev rx for tramadol, has been taking this at night with good relief. Has helped him sleep well.  trazodone made him "loopy"  Review of Systems  Constitutional: Negative for appetite change and unexpected weight change.  Musculoskeletal: Positive for arthralgias.  Psychiatric/Behavioral: Positive for sleep disturbance.  mood has been up and down, mostly related to work.      Objective:   Physical Exam  Constitutional: He appears well-nourished.  HENT:  Mouth/Throat: No oropharyngeal exudate.  Eyes: EOM are normal. Pupils are equal, round, and reactive to light.  Neck: Neck supple.  Cardiovascular: Normal rate and normal heart sounds.   Pulmonary/Chest: Effort normal and breath sounds normal.  Abdominal: Soft. Bowel sounds are normal. There is no tenderness. There is no rebound.  Musculoskeletal: He exhibits no edema.  Lymphadenopathy:    He has no cervical adenopathy.  Psychiatric: He has a normal mood and affect. Judgment and thought content normal.       Assessment & Plan:

## 2015-03-06 NOTE — Assessment & Plan Note (Signed)
Will refill gemfibrozil

## 2015-03-06 NOTE — Assessment & Plan Note (Signed)
Well controlled on current rx.  Takes BP at work and at home

## 2015-03-06 NOTE — Assessment & Plan Note (Signed)
encouaged to quit, he is not interested.

## 2015-03-06 NOTE — Assessment & Plan Note (Signed)
He denies suicidality

## 2015-03-06 NOTE — Assessment & Plan Note (Signed)
Gets blood work done at [place].  Doing very well Will change to genvoya at [facility].  Will see him back in 6 months Has gotten flu shot Given condoms

## 2015-03-06 NOTE — Addendum Note (Signed)
Addended by: Lorne Skeens D on: 03/06/2015 04:27 PM   Modules accepted: Orders

## 2015-04-07 ENCOUNTER — Emergency Department (HOSPITAL_COMMUNITY): Payer: No Typology Code available for payment source

## 2015-04-07 ENCOUNTER — Observation Stay (HOSPITAL_COMMUNITY)
Admission: EM | Admit: 2015-04-07 | Discharge: 2015-04-08 | Disposition: A | Discharge status: Home / Self Care | Payer: No Typology Code available for payment source | Attending: Student in an Organized Health Care Education/Training Program | Admitting: Student in an Organized Health Care Education/Training Program

## 2015-04-07 ENCOUNTER — Encounter (HOSPITAL_COMMUNITY): Payer: Self-pay | Admitting: Emergency Medicine

## 2015-04-07 DIAGNOSIS — E871 Hypo-osmolality and hyponatremia: Secondary | ICD-10-CM | POA: Diagnosis present

## 2015-04-07 DIAGNOSIS — E861 Hypovolemia: Secondary | ICD-10-CM

## 2015-04-07 DIAGNOSIS — B349 Viral infection, unspecified: Secondary | ICD-10-CM

## 2015-04-07 DIAGNOSIS — J101 Influenza due to other identified influenza virus with other respiratory manifestations: Secondary | ICD-10-CM | POA: Diagnosis not present

## 2015-04-07 DIAGNOSIS — J111 Influenza due to unidentified influenza virus with other respiratory manifestations: Secondary | ICD-10-CM

## 2015-04-07 DIAGNOSIS — R197 Diarrhea, unspecified: Secondary | ICD-10-CM | POA: Diagnosis not present

## 2015-04-07 DIAGNOSIS — I700 Atherosclerosis of aorta: Secondary | ICD-10-CM | POA: Insufficient documentation

## 2015-04-07 DIAGNOSIS — E785 Hyperlipidemia, unspecified: Secondary | ICD-10-CM | POA: Diagnosis present

## 2015-04-07 DIAGNOSIS — F17200 Nicotine dependence, unspecified, uncomplicated: Secondary | ICD-10-CM | POA: Diagnosis present

## 2015-04-07 DIAGNOSIS — R740 Nonspecific elevation of levels of transaminase and lactic acid dehydrogenase [LDH]: Secondary | ICD-10-CM | POA: Diagnosis not present

## 2015-04-07 DIAGNOSIS — K219 Gastro-esophageal reflux disease without esophagitis: Secondary | ICD-10-CM | POA: Diagnosis present

## 2015-04-07 DIAGNOSIS — B20 Human immunodeficiency virus [HIV] disease: Secondary | ICD-10-CM | POA: Diagnosis present

## 2015-04-07 DIAGNOSIS — Z21 Asymptomatic human immunodeficiency virus [HIV] infection status: Secondary | ICD-10-CM | POA: Diagnosis present

## 2015-04-07 DIAGNOSIS — F17210 Nicotine dependence, cigarettes, uncomplicated: Secondary | ICD-10-CM | POA: Insufficient documentation

## 2015-04-07 DIAGNOSIS — N179 Acute kidney failure, unspecified: Secondary | ICD-10-CM | POA: Insufficient documentation

## 2015-04-07 DIAGNOSIS — I10 Essential (primary) hypertension: Secondary | ICD-10-CM | POA: Diagnosis present

## 2015-04-07 LAB — COMPREHENSIVE METABOLIC PANEL
ALT: 88 U/L — ABNORMAL HIGH (ref 17–63)
AST: 78 U/L — ABNORMAL HIGH (ref 15–41)
Albumin: 4.3 g/dL (ref 3.5–5.0)
Alkaline Phosphatase: 52 U/L (ref 38–126)
Anion gap: 12 (ref 5–15)
BUN: 44 mg/dL — ABNORMAL HIGH (ref 6–20)
CO2: 17 mmol/L — ABNORMAL LOW (ref 22–32)
Calcium: 7.6 mg/dL — ABNORMAL LOW (ref 8.9–10.3)
Chloride: 103 mmol/L (ref 101–111)
Creatinine, Ser: 2.2 mg/dL — ABNORMAL HIGH (ref 0.61–1.24)
GFR calc Af Amer: 37 mL/min — ABNORMAL LOW (ref >60)
GFR calc non Af Amer: 32 mL/min — ABNORMAL LOW (ref >60)
Glucose, Bld: 140 mg/dL — ABNORMAL HIGH (ref 65–99)
Potassium: 3.8 mmol/L (ref 3.5–5.1)
Sodium: 132 mmol/L — ABNORMAL LOW (ref 135–145)
Total Bilirubin: 0.4 mg/dL (ref 0.3–1.2)
Total Protein: 7.1 g/dL (ref 6.5–8.1)

## 2015-04-07 LAB — CBC WITH DIFFERENTIAL/PLATELET
Basophils Absolute: 0 10*3/uL (ref 0.0–0.1)
Basophils Relative: 0 %
Eosinophils Absolute: 0.1 10*3/uL (ref 0.0–0.7)
Eosinophils Relative: 1 %
HCT: 41 % (ref 39.0–52.0)
Hemoglobin: 13.7 g/dL (ref 13.0–17.0)
Lymphocytes Relative: 36 %
Lymphs Abs: 1.9 10*3/uL (ref 0.7–4.0)
MCH: 33.3 pg (ref 26.0–34.0)
MCHC: 33.4 g/dL (ref 30.0–36.0)
MCV: 99.8 fL (ref 78.0–100.0)
Monocytes Absolute: 0.5 10*3/uL (ref 0.1–1.0)
Monocytes Relative: 10 %
Neutro Abs: 2.8 10*3/uL (ref 1.7–7.7)
Neutrophils Relative %: 53 %
Platelets: 196 10*3/uL (ref 150–400)
RBC: 4.11 MIL/uL — ABNORMAL LOW (ref 4.22–5.81)
RDW: 13 % (ref 11.5–15.5)
WBC: 5.4 10*3/uL (ref 4.0–10.5)

## 2015-04-07 LAB — URINALYSIS, ROUTINE W REFLEX MICROSCOPIC
Bilirubin Urine: NEGATIVE
Glucose, UA: NEGATIVE mg/dL
Ketones, ur: NEGATIVE mg/dL
Leukocytes, UA: NEGATIVE
Nitrite: NEGATIVE
Protein, ur: NEGATIVE mg/dL
Specific Gravity, Urine: 1.011 (ref 1.005–1.030)
pH: 5.5 (ref 5.0–8.0)

## 2015-04-07 LAB — URINE MICROSCOPIC-ADD ON

## 2015-04-07 LAB — INFLUENZA PANEL BY PCR (TYPE A & B)
H1N1 flu by pcr: NOT DETECTED
Influenza A By PCR: NEGATIVE
Influenza B By PCR: POSITIVE — AB

## 2015-04-07 LAB — LIPASE, BLOOD: Lipase: 32 U/L (ref 11–51)

## 2015-04-07 MED ORDER — ONDANSETRON HCL 4 MG/2ML IJ SOLN: 4.0000 mg | Freq: Four times a day (QID) | INTRAMUSCULAR | Status: DC | PRN

## 2015-04-07 MED ORDER — GEMFIBROZIL 600 MG PO TABS: 600.0000 mg | ORAL_TABLET | Freq: Every day | ORAL | Status: DC

## 2015-04-07 MED ORDER — NICOTINE 21 MG/24HR TD PT24
21.0000 mg | MEDICATED_PATCH | Freq: Every day | TRANSDERMAL | Status: DC
  Administered 2015-04-07 – 2015-04-08 (×2): 21 mg via TRANSDERMAL
  Filled 2015-04-07 (×2): qty 1

## 2015-04-07 MED ORDER — PANTOPRAZOLE SODIUM 40 MG PO TBEC
40.0000 mg | DELAYED_RELEASE_TABLET | Freq: Every day | ORAL | Status: DC
  Administered 2015-04-07 – 2015-04-08 (×2): 40 mg via ORAL
  Filled 2015-04-07 (×2): qty 1

## 2015-04-07 MED ORDER — SODIUM CHLORIDE 0.9 % IV SOLN
INTRAVENOUS | Status: AC
  Administered 2015-04-07 (×2): via INTRAVENOUS

## 2015-04-07 MED ORDER — NON FORMULARY: 1.5000 mg | Freq: Once | Status: DC

## 2015-04-07 MED ORDER — ENSURE ENLIVE PO LIQD
237.0000 mL | Freq: Two times a day (BID) | ORAL | Status: DC
  Administered 2015-04-07 – 2015-04-08 (×2): 237 mL via ORAL

## 2015-04-07 MED ORDER — BENZONATATE 100 MG PO CAPS
100.0000 mg | ORAL_CAPSULE | Freq: Two times a day (BID) | ORAL | Status: DC
  Administered 2015-04-07 – 2015-04-08 (×3): 100 mg via ORAL
  Filled 2015-04-07 (×3): qty 1

## 2015-04-07 MED ORDER — SODIUM CHLORIDE 0.9 % IV BOLUS (SEPSIS)
1000.0000 mL | Freq: Once | INTRAVENOUS | Status: AC
  Administered 2015-04-07: 1000 mL via INTRAVENOUS

## 2015-04-07 MED ORDER — TRAMADOL HCL 50 MG PO TABS
100.0000 mg | ORAL_TABLET | Freq: Two times a day (BID) | ORAL | Status: DC | PRN
  Administered 2015-04-07: 100 mg via ORAL
  Filled 2015-04-07: qty 2

## 2015-04-07 MED ORDER — ONDANSETRON 4 MG PO TBDP
4.0000 mg | ORAL_TABLET | Freq: Once | ORAL | Status: AC
  Administered 2015-04-07: 4 mg via ORAL
  Filled 2015-04-07: qty 1

## 2015-04-07 MED ORDER — MELATONIN 3 MG PO TABS
1.5000 mg | ORAL_TABLET | Freq: Every day | ORAL | Status: DC
  Administered 2015-04-07: 1.5 mg via ORAL
  Filled 2015-04-07: qty 0.5

## 2015-04-07 MED ORDER — ACETAMINOPHEN 325 MG PO TABS
650.0000 mg | ORAL_TABLET | Freq: Once | ORAL | Status: AC
  Administered 2015-04-07: 650 mg via ORAL
  Filled 2015-04-07: qty 2

## 2015-04-07 MED ORDER — OSELTAMIVIR PHOSPHATE 30 MG PO CAPS
30.0000 mg | ORAL_CAPSULE | Freq: Two times a day (BID) | ORAL | Status: DC
  Administered 2015-04-07 – 2015-04-08 (×3): 30 mg via ORAL
  Filled 2015-04-07 (×4): qty 1

## 2015-04-07 MED ORDER — PRAVASTATIN SODIUM 40 MG PO TABS: 20.0000 mg | ORAL_TABLET | Freq: Every day | ORAL | Status: DC

## 2015-04-07 MED ORDER — ACETAMINOPHEN 650 MG RE SUPP: 650.0000 mg | Freq: Four times a day (QID) | RECTAL | Status: DC | PRN

## 2015-04-07 MED ORDER — ENOXAPARIN SODIUM 40 MG/0.4ML ~~LOC~~ SOLN
40.0000 mg | SUBCUTANEOUS | Status: DC
  Administered 2015-04-07: 40 mg via SUBCUTANEOUS
  Filled 2015-04-07 (×2): qty 0.4

## 2015-04-07 MED ORDER — ELVITEG-COBIC-EMTRICIT-TENOFAF 150-150-200-10 MG PO TABS
1.0000 | ORAL_TABLET | Freq: Every day | ORAL | Status: DC
  Administered 2015-04-07 – 2015-04-08 (×2): 1 via ORAL
  Filled 2015-04-07 (×3): qty 1

## 2015-04-07 MED ORDER — IPRATROPIUM-ALBUTEROL 0.5-2.5 (3) MG/3ML IN SOLN
3.0000 mL | Freq: Three times a day (TID) | RESPIRATORY_TRACT | Status: DC
  Administered 2015-04-07 – 2015-04-08 (×3): 3 mL via RESPIRATORY_TRACT
  Filled 2015-04-07 (×3): qty 3

## 2015-04-07 MED ORDER — ACETAMINOPHEN 325 MG PO TABS: 650.0000 mg | ORAL_TABLET | Freq: Four times a day (QID) | ORAL | Status: DC | PRN

## 2015-04-07 MED ORDER — SODIUM CHLORIDE 0.9 % IV BOLUS (SEPSIS)
1000.0000 mL | Freq: Once | INTRAVENOUS | Status: AC
Start: 2015-04-07 — End: 2015-04-07
  Administered 2015-04-07: 1000 mL via INTRAVENOUS

## 2015-04-07 MED ORDER — OSELTAMIVIR PHOSPHATE 30 MG PO CAPS
30.0000 mg | ORAL_CAPSULE | Freq: Once | ORAL | Status: DC
  Filled 2015-04-07: qty 1

## 2015-04-07 NOTE — Progress Notes (Signed)
Nutrition Brief Note  Patient identified on the Malnutrition Screening Tool (MST) Report  Wt Readings from Last 15 Encounters:  04/07/15 180 lb 1.6 oz (81.693 kg)  03/06/15 185 lb (83.915 kg)  10/26/14 191 lb (86.637 kg)  09/30/14 187 lb 12 oz (85.163 kg)  05/05/14 180 lb (81.647 kg)  04/10/14 175 lb (79.379 kg)  04/01/14 177 lb 8 oz (80.513 kg)  10/18/13 173 lb (78.472 kg)  10/06/13 174 lb 8 oz (79.153 kg)  10/06/13 174 lb 8 oz (79.153 kg)  06/23/13 184 lb (83.462 kg)  04/27/13 183 lb 8 oz (83.235 kg)  04/27/13 183 lb 8 oz (83.235 kg)  10/27/12 185 lb (83.915 kg)  10/27/12 185 lb (83.915 kg)   Jacob Ali is a very pleasant 55 year old man with past medical history of HIV (last CD4 952 on 9/16/1/6), hypertension, hyperlipidemia, GERD, and tobacco use who presents with flu-like symptoms.   Spoke with pt at bedside. He reports he typically "eats like a pig", however has experienced decreased appetite over the past 6 days related to diarrhea. He reports he has been consuming mainly Gatorade, rice, and toast. He reveals his appetite is returning and is eagerly awaiting his tray, which was delivered upon conclusion of RD visit.   Pt reports UBW is around 185#.  Nutrition-Focused physical exam completed. Findings are no fat depletion, no muscle depletion, and no edema.   Body mass index is 26.58 kg/(m^2). Patient meets criteria for overweight based on current BMI.   Current diet order is regular, patient is consuming approximately n/a% of meals at this time. Labs and medications reviewed.   No nutrition interventions warranted at this time. If nutrition issues arise, please consult RD.   Kiara Keep A. Jimmye Norman, RD, LDN, CDE Pager: [phone] After hours Pager: [phone]

## 2015-04-07 NOTE — ED Notes (Signed)
Pt to ER via POV with complaint of six days of cough, congestion, generalized body aches and diarrhea. Pt is a/o x4.

## 2015-04-07 NOTE — Progress Notes (Signed)
ARKEL CANCELLIERE [id number] Admitted to 5W 18: 04/07/2015 2:05 PM Attending Provider: Axel Filler, MD    Jacob Ali is a 55 y.o. male patient admitted from ED awake, alert  & orientated  X 3,  Full Code, VSS - Blood pressure 135/69, pulse 88, temperature 99.4 F (37.4 C), temperature source Oral, resp. rate 18, height 5\' 9"  (1.753 m), weight 81.693 kg (180 lb 1.6 oz), SpO2 97 %., R/A, no c/o shortness of breath, no c/o chest pain, no distress noted. Non Tele.   IV site WDL:  forearm right, condition patent and no redness with a transparent dsg that's clean dry and intact.  Allergies:  No Known Allergies   Past Medical History  Diagnosis Date  . HIV infection ([place])   . Hyperlipidemia   . Hypertension   . PONV (postoperative nausea and vomiting)     History:  obtained from the patient.  Pt orientation to unit, room and routine. Information packet given to patient/family and safety video watched.  Admission INP armband ID verified with patient/family, and in place. SR up x 2, fall risk assessment complete with Patient and family verbalizing understanding of risks associated with falls. Pt verbalizes an understanding of how to use the call bell and to call for help before getting out of bed.  Skin, clean-dry- intact without evidence of bruising, or skin tears.   No evidence of skin break down noted on exam.    Will cont to monitor and assist as needed.  Lindalou Hose, RN 04/07/2015 2:05 PM

## 2015-04-07 NOTE — H&P (Signed)
Date: 04/07/2015               Patient Name:  Jacob Ali MRN: [id number]  DOB: [birth year] Age / Sex: 55 y.o., male   PCP: Campbell Riches, MD         Medical Service: Internal Medicine Teaching Service         Attending Physician: Dr. Axel Filler, MD    First Contact: Dr. Marlowe Sax  Pager: [phone]  Second Contact: Dr. Randell Patient Pager: [phone]       After Hours (After 5p/  First Contact Pager: [phone]  weekends / holidays): Second Contact Pager: [phone]   Chief Complaint: flu like symptoms   History of Present Illness:  Jacob Ali is a very pleasant 55 year old man with past medical history of HIV (last CD4 952 on 9/16/1/6), hypertension, hyperlipidemia, GERD, and tobacco use who presents with flu-like symptoms.   He reports feeling in his normal state of health until 5 days ago when he developed subjective fever, chills, rhinorrhea, nasal congestion, dry cough, dyspnea, chest tightness with coughing, wheezing, myalgias, and malaise. He then began to have watery non-bloody diarrhea two days ago with lower abdominal cramping and nausea (this morning) without vomiting. He had mild BRBPR in setting of his hemorrhoids. He has had decreased PO intake and ate rice and toast yesterday. He reports several sick contacts at his work. He did receive a flu shot this year. He has been taking ibuprofen 800 mg BID for 5 days and tramadol as needed with some relief. He denies urinary symptoms and reports normal urine output. He is a current smoker with 0.5 ppd history for the past 40 years. He denies history of COPD and has never had PFT's.   His last CD4 count was 952 with undetectable viral load on 09/30/14. He is compliant with taking his HIV medications.   On admission to the ED his vital signs were stable with normal SpO2 on room air. Chest xray revealed no abnormality. He was found to have acute kidney injury with Cr 2.20 from baseline 0.9 with hypocalcemia (corrected calcium 7.4),  hyponatremia to 132, and mildly elevated AST/ALT of 78/88. His WBC was normal at 5.4. He was given 2 L of NS, tylenol, and zofran.   Meds:  No current facility-administered medications on file prior to encounter.   Current Outpatient Prescriptions on File Prior to Encounter  Medication Sig Dispense Refill  . elvitegravir-cobicistat-emtricitabine-tenofovir (STRIBILD) 150-150-200-300 MG TABS tablet Take 1 tablet by mouth daily with breakfast. 90 tablet 6  . gemfibrozil (LOPID) 600 MG tablet Take 1 tablet (600 mg total) by mouth daily. To help lower cholesterol. 90 tablet 3  . ibuprofen (ADVIL,MOTRIN) 400 MG tablet Take 1 tablet (400 mg total) by mouth every 8 (eight) hours as needed for moderate pain. Take with food (Patient taking differently: Take 800 mg by mouth every 8 (eight) hours as needed for fever or moderate pain. Take with food) 30 tablet 1  . lisinopril-hydrochlorothiazide (PRINZIDE,ZESTORETIC) 20-25 MG tablet Take 1 tablet by mouth daily. 90 tablet 3  . omeprazole (PRILOSEC) 20 MG capsule Take 1 capsule (20 mg total) by mouth daily. 90 capsule 3  . traMADol (ULTRAM) 50 MG tablet Take 1 tablet (50 mg total) by mouth every 6 (six) hours as needed. 25 tablet 0     Allergies: Allergies as of 04/07/2015  . (No Known Allergies)   Past Medical History  Diagnosis Date  . HIV infection ([place])   .  Hyperlipidemia   . Hypertension   . PONV (postoperative nausea and vomiting)    Past Surgical History  Procedure Laterality Date  . Examination under anesthesia, repair of anal fissure,  12/04/2000   Family History  Problem Relation Age of Onset  . Throat cancer Father   . Heart disease Father   . Cancer Maternal Grandmother     colon  . Cancer Father     bladder   Social History   Social History  . Marital Status: Single    Spouse Name: N/A  . Number of Children: N/A  . Years of Education: 12   Occupational History  . [place] History  Main Topics  . Smoking status: Current Every Day Smoker -- 0.50 packs/day for 40 years    Types: Cigarettes  . Smokeless tobacco: Never Used  . Alcohol Use: 0.6 oz/week    1 Standard drinks or equivalent per week     Comment: beer occasional  . Drug Use: 2.00 per week    Special: Marijuana  . Sexual Activity: Not Currently     Comment: pt. declined condoms   Other Topics Concern  . Not on file   Social History Narrative   Single, lives alone.      Review of Systems: Review of Systems  Constitutional: Positive for fever, chills and malaise/fatigue.  HENT: Positive for congestion and sore throat (mild from coughing).   Eyes: Negative for blurred vision.  Respiratory: Positive for cough, shortness of breath and wheezing. Negative for sputum production.   Cardiovascular: Negative for chest pain and leg swelling.  Gastrointestinal: Positive for nausea, abdominal pain, diarrhea and blood in stool (hemorrhoids). Negative for vomiting and constipation.  Genitourinary: Negative for dysuria, urgency and frequency.  Musculoskeletal: Positive for myalgias, back pain and joint pain (hips).  Skin: Negative for rash.  Neurological: Positive for dizziness (lightheaded) and weakness. Negative for headaches.  Psychiatric/Behavioral: Positive for substance abuse (tobacco).      Physical Exam: Blood pressure 105/70, pulse 79, temperature 98.1 F (36.7 C), temperature source Oral, resp. rate 18, SpO2 97 %.  Physical Exam  Constitutional: He is oriented to person, place, and time. He appears well-developed and well-nourished. No distress.  HENT:  Head: Normocephalic and atraumatic.  Right Ear: External ear normal.  Left Ear: External ear normal.  Nose: Nose normal.  Mouth/Throat: No oropharyngeal exudate.  Mild erythema of posterior pharynx   Eyes: Conjunctivae and EOM are normal. Pupils are equal, round, and reactive to light. Right eye exhibits no discharge. Left eye exhibits no  discharge. No scleral icterus.  Neck: Normal range of motion. Neck supple.  Cardiovascular: Normal rate, regular rhythm and normal heart sounds.   Pulmonary/Chest: Effort normal and breath sounds normal. No respiratory distress. He has no wheezes. He has no rales.  Abdominal: Soft. Bowel sounds are normal. He exhibits no distension. There is tenderness (lower abdomen). There is no rebound and no guarding.  Musculoskeletal: Normal range of motion. He exhibits no edema or tenderness.  Neurological: He is alert and oriented to person, place, and time. No cranial nerve deficit.  Skin: Skin is warm and dry. No rash noted. He is not diaphoretic. No erythema. No pallor.  Psychiatric: He has a normal mood and affect. His behavior is normal. Judgment and thought content normal.     Lab results: Basic Metabolic Panel:  Recent Labs  04/07/15 0930  NA 132*  K 3.8  CL 103  CO2 17*  GLUCOSE 140*  BUN 44*  CREATININE 2.20*  CALCIUM 7.6*   Liver Function Tests:  Recent Labs  04/07/15 0930  AST 78*  ALT 88*  ALKPHOS 52  BILITOT 0.4  PROT 7.1  ALBUMIN 4.3    Recent Labs  04/07/15 0930  LIPASE 32   CBC:  Recent Labs  04/07/15 0930  WBC 5.4  NEUTROABS 2.8  HGB 13.7  HCT 41.0  MCV 99.8  PLT 196     Imaging results:  Dg Chest 2 View  04/07/2015  CLINICAL DATA:  Cough and fever for 6 days EXAM: CHEST  2 VIEW COMPARISON:  None. FINDINGS: Lungs are clear. Heart size and pulmonary vascularity are normal. No adenopathy. There is atherosclerotic calcification in the aortic arch region. No bone lesions evident. IMPRESSION: No edema or consolidation. Electronically Signed   By: Lowella Grip III M.D.   On: 04/07/2015 09:46    Other results: EKG: None  Assessment & Plan by Problem:  Probable Influenza Virus infection - Pt with 5-day history of URI symptoms, dyspnea, diarrhea, and lower abdominal cramping most likely due to influenza virus infection in setting of recent sick  contacts. Chest xray with no superimposed pneumonia. He has no oxygen requirement. Watery diarrhea is slowly improving. He is hypovolemic in setting of decreased PO intake/diarrhea found to have soft blood pressure and AKI. -Admit to med-surg -Oxygen therapy to keep SpO2 > 92% -Start NS 125 mL/hr x 12 hrs (received 2 L NS in ED) -Obtain flu PCR and start empiric tamiflu 30 mg BID for 5 days  -Start zofran 4 mg Q 6 hr PRN nausea/vomitng  -Start duonebs TID for acute bronchospasm -Start tessalon 100 mg BID for cough -Start tylenol 650 mg Q 6 hr PRN fever/pain -Continue home tramadol 100 mg BID PRN pain  -Ambulate with SpO2 tomorrow   Non-oliguric AKI - Pt with Cr 2.20 from baseline 0.9 most likely pre-renal azotemia in setting of hypovolemia from decreased PO intake/diarrhea and concomitant NSAID use for the past 5 days on top of ACEi use.  -Obtain UA and repeat BMP in AM -Start NS 125 mL/hr x 12 hrs (received 2 L NS in ED) -Monitor daily weights and strict I & O's -Avoid nephrotoxins -Hold home lisinopril-HCTZ 20-25 mg daily  Hypocalcemia - Pt with corrected calcium calcium 7.4 with normal prior calcium levels. Most likely transiently low in setting of decreased PO intake.   -Obtain phosphorous and magnesium and repeat CMP in AM -If not improved consider 25-OH vitamin D and PTH level   HIV Infection - Last CD4 count was 952 with undetectable viral load on 09/30/14. He reports compliance with taking his HIV medications.  -Genvoya 150-150-200-10 mg daily (formulary change from home stribilid) -Pt needs to follow-up with Dr. Johnnye Sima, will defer CD4 and VL in setting of infection -Obtain HCV Ab  Hypertension - Currently with soft blood pressure of 105/70. -Hold home lisinopril-HCTZ 20-25 mg daily  Hyperlipidemia - Last lipid panel on 09/30/14 with LDL 96 and TG 183. Pt with 10 yr ASCVD >7.5%. Unclear why patient is on gemfibrozil as he has no history of hypertriglyceridemia.   -Discontinue  home gemfibrozil 600 mg daily -Consider starting pravastatin 20 mg daily after LFT's normalize  -Pt needs PCP follow-up, would like to establish with our clinic   GERD - Currently stable. Pt at home on prilosec 20 mg daily.  -Protonix 40 mg daily  Tobacco Use - Pt with 0.5 ppd cigarette use for the past  40 years. -Start nicotine 21 mg patch daily -Counsel on cessation  Diet: Regular  DVT Ppx: Lovenox  Code: Full    Dispo: Disposition is deferred at this time, awaiting improvement of current medical problems. Anticipated discharge in approximately 1 day(s).   The patient does not have a current PCP Campbell Riches, MD) and does need an [facility] hospital follow-up appointment after discharge.  The patient does not have transportation limitations that hinder transportation to clinic appointments.  Signed: Juluis Mire, MD 04/07/2015, 12:04 PM

## 2015-04-07 NOTE — ED Provider Notes (Signed)
CSN: [id number]     Arrival date & time 04/07/15  [id number] History   First MD Initiated Contact with Patient 04/07/15 [phone]     Chief Complaint  Patient presents with  . URI  . Diarrhea      HPI  55 y.o. male with history of HIV, last CD4 count 950 with undetectable viral load for many years, compliant with HART therapy, coming in with flulike symptoms for the last 5 days. Patient reports developing intermittent headaches, myalgias, sore throat, cough, low-grade fevers, vomiting, and diarrhea 5 days ago. Symptoms initially improved but now the cough is returned. He has been tolerating oral intake and has not vomited since yesterday. Reports numerous episodes of semisolid diarrhea daily. Denies abdominal pain. No chest pain. Short of breath only with coughing. No meningismus or neck pain. Headache is not accompanied with any neurologic deficits or vision changes. Denies fevers above 100.   Past Medical History  Diagnosis Date  . HIV infection ([place])   . Hyperlipidemia   . Hypertension   . PONV (postoperative nausea and vomiting)    Past Surgical History  Procedure Laterality Date  . Examination under anesthesia, repair of anal fissure,  12/04/2000   Family History  Problem Relation Age of Onset  . Throat cancer Father   . Heart disease Father   . Cancer Maternal Grandmother     colon  . Cancer Father     bladder   Social History  Substance Use Topics  . Smoking status: Current Every Day Smoker -- 0.50 packs/day for 40 years    Types: Cigarettes  . Smokeless tobacco: Never Used  . Alcohol Use: 0.6 oz/week    1 Standard drinks or equivalent per week     Comment: beer occasional    Review of Systems  Constitutional: Positive for chills, appetite change and fatigue. Negative for fever and activity change.  HENT: Negative for congestion, facial swelling, rhinorrhea and sore throat.   Eyes: Negative for visual disturbance.  Respiratory: Positive for cough. Negative for shortness  of breath and wheezing.   Cardiovascular: Negative for chest pain, palpitations and leg swelling.  Gastrointestinal: Positive for nausea, vomiting and diarrhea. Negative for abdominal pain, constipation and blood in stool.  Genitourinary: Negative for dysuria, frequency, hematuria, flank pain and difficulty urinating.  Musculoskeletal: Positive for myalgias (generalized) and back pain (low back). Negative for joint swelling, arthralgias, gait problem, neck pain and neck stiffness.  Skin: Negative for rash.  Neurological: Positive for weakness (generalized) and headaches (intermittent, not present currently). Negative for dizziness, syncope and light-headedness.  Psychiatric/Behavioral: Negative for behavioral problems, confusion and agitation.      Allergies  Review of patient's allergies indicates no known allergies.  Home Medications   Prior to Admission medications   Medication Sig Start Date End Date Taking? Authorizing Provider  elvitegravir-cobicistat-emtricitabine-tenofovir (STRIBILD) 150-150-200-300 MG TABS tablet Take 1 tablet by mouth daily with breakfast. 06/23/13  Yes Campbell Riches, MD  gemfibrozil (LOPID) 600 MG tablet Take 1 tablet (600 mg total) by mouth daily. To help lower cholesterol. 03/06/15  Yes Campbell Riches, MD  ibuprofen (ADVIL,MOTRIN) 400 MG tablet Take 1 tablet (400 mg total) by mouth every 8 (eight) hours as needed for moderate pain. Take with food Patient taking differently: Take 800 mg by mouth every 8 (eight) hours as needed for fever or moderate pain. Take with food 11/01/14  Yes Johnn Hai, PA-C  lisinopril-hydrochlorothiazide (PRINZIDE,ZESTORETIC) 20-25 MG tablet Take 1 tablet by mouth daily.  03/06/15  Yes Campbell Riches, MD  omeprazole (PRILOSEC) 20 MG capsule Take 1 capsule (20 mg total) by mouth daily. 03/06/15  Yes Campbell Riches, MD  traMADol (ULTRAM) 50 MG tablet Take 1 tablet (50 mg total) by mouth every 6 (six) hours as needed. 03/06/15   Yes Campbell Riches, MD   BP 105/70 mmHg  Pulse 79  Temp(Src) 98.1 F (36.7 C) (Oral)  Resp 18  SpO2 97% Physical Exam  Constitutional: He is oriented to person, place, and time. He appears well-developed and well-nourished. No distress.  HENT:  Head: Normocephalic and atraumatic.  Right Ear: External ear normal.  Left Ear: External ear normal.  Nose: Nose normal.  Mouth/Throat: Oropharynx is clear and moist. No oropharyngeal exudate.  Eyes: Conjunctivae are normal. Pupils are equal, round, and reactive to light. Right eye exhibits no discharge. Left eye exhibits no discharge. No scleral icterus.  Neck: Normal range of motion. Neck supple. No tracheal deviation present.  Cardiovascular: Normal rate, regular rhythm and normal heart sounds.  Exam reveals no gallop and no friction rub.   No murmur heard. Pulmonary/Chest: Effort normal and breath sounds normal. No respiratory distress. He has no wheezes. He has no rales.  Abdominal: Soft. Bowel sounds are normal. He exhibits no distension and no mass. There is no tenderness. There is no rebound and no guarding.  Musculoskeletal: Normal range of motion. He exhibits no edema or tenderness.  Neurological: He is alert and oriented to person, place, and time. He exhibits normal muscle tone.  Skin: Skin is warm and dry. No rash noted. He is not diaphoretic.  Psychiatric: He has a normal mood and affect. His behavior is normal. Judgment and thought content normal.    ED Course  Procedures (including critical care time) Labs Review Labs Reviewed  CBC WITH DIFFERENTIAL/PLATELET - Abnormal; Notable for the following:    RBC 4.11 (*)    All other components within normal limits  COMPREHENSIVE METABOLIC PANEL - Abnormal; Notable for the following:    Sodium 132 (*)    CO2 17 (*)    Glucose, Bld 140 (*)    BUN 44 (*)    Creatinine, Ser 2.20 (*)    Calcium 7.6 (*)    AST 78 (*)    ALT 88 (*)    GFR calc non Af Amer 32 (*)    GFR calc Af  Amer 37 (*)    All other components within normal limits  LIPASE, BLOOD  INFLUENZA PANEL BY PCR (TYPE A & B, H1N1)    Imaging Review Dg Chest 2 View  04/07/2015  CLINICAL DATA:  Cough and fever for 6 days EXAM: CHEST  2 VIEW COMPARISON:  None. FINDINGS: Lungs are clear. Heart size and pulmonary vascularity are normal. No adenopathy. There is atherosclerotic calcification in the aortic arch region. No bone lesions evident. IMPRESSION: No edema or consolidation. Electronically Signed   By: Lowella Grip III M.D.   On: 04/07/2015 09:46   I have personally reviewed and evaluated these images and lab results as part of my medical decision-making.   EKG Interpretation None      MDM   Final diagnoses:  Viral illness    Patient is generally well-appearing, though appears dry. Benign abdominal exam and no meningeal signs. Labs reveal an AKI with creatinine of 2.2 elevated from baseline of 0.98. I suspect that this is in the setting of dehydration from his recent illness. IV fluids given. Given his constellation  of symptoms, I suspect an influenza-like illness. He is outside the Tamiflu window. Chest x-ray with no focal consolidation to suggest pneumonia. Doubt other serious bacterial illness. Will admit to the internal medicine service for observation for continued fluid rehydration. Remained HDS while in the ED.    Zipporah Plants, MD 04/07/15 1137  Leo Grosser, MD 04/07/15 [phone]

## 2015-04-07 NOTE — Progress Notes (Signed)
Received report from [facility], RN will await for pt. to arrive from the ED.  Alphonzo Lemmings, RN

## 2015-04-08 ENCOUNTER — Encounter: Payer: Self-pay | Admitting: Internal Medicine

## 2015-04-08 DIAGNOSIS — Z21 Asymptomatic human immunodeficiency virus [HIV] infection status: Secondary | ICD-10-CM

## 2015-04-08 DIAGNOSIS — J101 Influenza due to other identified influenza virus with other respiratory manifestations: Secondary | ICD-10-CM | POA: Diagnosis not present

## 2015-04-08 DIAGNOSIS — N179 Acute kidney failure, unspecified: Secondary | ICD-10-CM

## 2015-04-08 LAB — PHOSPHORUS: Phosphorus: 2.3 mg/dL — ABNORMAL LOW (ref 2.5–4.6)

## 2015-04-08 LAB — COMPREHENSIVE METABOLIC PANEL
ALT: 65 U/L — ABNORMAL HIGH (ref 17–63)
AST: 72 U/L — ABNORMAL HIGH (ref 15–41)
Albumin: 3.8 g/dL (ref 3.5–5.0)
Alkaline Phosphatase: 46 U/L (ref 38–126)
Anion gap: 8 (ref 5–15)
BUN: 16 mg/dL (ref 6–20)
CO2: 24 mmol/L (ref 22–32)
Calcium: 7.7 mg/dL — ABNORMAL LOW (ref 8.9–10.3)
Chloride: 107 mmol/L (ref 101–111)
Creatinine, Ser: 1.14 mg/dL (ref 0.61–1.24)
GFR calc Af Amer: >60 mL/min (ref >60)
GFR calc non Af Amer: >60 mL/min (ref >60)
Glucose, Bld: 109 mg/dL — ABNORMAL HIGH (ref 65–99)
Potassium: 4.2 mmol/L (ref 3.5–5.1)
Sodium: 139 mmol/L (ref 135–145)
Total Bilirubin: 0.2 mg/dL — ABNORMAL LOW (ref 0.3–1.2)
Total Protein: 6.4 g/dL — ABNORMAL LOW (ref 6.5–8.1)

## 2015-04-08 LAB — MAGNESIUM: Magnesium: 2.4 mg/dL (ref 1.7–2.4)

## 2015-04-08 MED ORDER — LISINOPRIL 20 MG PO TABS: 20.0000 mg | ORAL_TABLET | Freq: Every day | ORAL | Status: DC

## 2015-04-08 MED ORDER — LISINOPRIL-HYDROCHLOROTHIAZIDE 20-25 MG PO TABS: 1.0000 | ORAL_TABLET | Freq: Every day | ORAL | Status: DC

## 2015-04-08 MED ORDER — ACETAMINOPHEN 500 MG PO TABS: 500.0000 mg | ORAL_TABLET | Freq: Four times a day (QID) | ORAL | Status: DC | PRN

## 2015-04-08 MED ORDER — BENZONATATE 100 MG PO CAPS: 100.0000 mg | ORAL_CAPSULE | Freq: Two times a day (BID) | ORAL | Status: DC | PRN

## 2015-04-08 MED ORDER — HYDROCHLOROTHIAZIDE 25 MG PO TABS: 25.0000 mg | ORAL_TABLET | Freq: Every day | ORAL | Status: DC

## 2015-04-08 MED ORDER — IPRATROPIUM-ALBUTEROL 0.5-2.5 (3) MG/3ML IN SOLN: 3.0000 mL | Freq: Two times a day (BID) | RESPIRATORY_TRACT | Status: DC

## 2015-04-08 MED ORDER — OSELTAMIVIR PHOSPHATE 30 MG PO CAPS: 30.0000 mg | ORAL_CAPSULE | Freq: Two times a day (BID) | ORAL | Status: AC

## 2015-04-08 MED ORDER — ALBUTEROL SULFATE HFA 108 (90 BASE) MCG/ACT IN AERS: 1.0000 | INHALATION_SPRAY | Freq: Four times a day (QID) | RESPIRATORY_TRACT | Status: DC | PRN

## 2015-04-08 MED ORDER — OSELTAMIVIR PHOSPHATE 30 MG PO CAPS: 30.0000 mg | ORAL_CAPSULE | Freq: Two times a day (BID) | ORAL | Status: DC

## 2015-04-08 NOTE — Discharge Summary (Signed)
Name: Jacob Ali MRN: [id number] DOB: Apr 24, 1960 55 y.o. PCP: Campbell Riches, MD  Date of Admission: 04/07/2015  9:04 AM Date of Discharge: 04/08/2015 Attending Physician: Axel Filler, MD  Discharge Diagnosis:  Principal Problem:   Influenza B Active Problems:   HIV (human immunodeficiency virus infection) ([place])   Hypertension   Hyperlipidemia   Hyponatremia   Tobacco use disorder   GERD (gastroesophageal reflux disease)   Hypocalcemia  Discharge Medications:   Medication List    STOP taking these medications        gemfibrozil 600 MG tablet  Commonly known as:  LOPID     ibuprofen 400 MG tablet  Commonly known as:  ADVIL,MOTRIN      TAKE these medications        acetaminophen 500 MG tablet  Commonly known as:  TYLENOL  Take 1 tablet (500 mg total) by mouth every 6 (six) hours as needed for mild pain (or Fever >/= 101).     albuterol 108 (90 Base) MCG/ACT inhaler  Commonly known as:  PROVENTIL HFA  Inhale 1-2 puffs into the lungs every 6 (six) hours as needed for wheezing or shortness of breath.     benzonatate 100 MG capsule  Commonly known as:  TESSALON  Take 1 capsule (100 mg total) by mouth 2 (two) times daily as needed for cough.     elvitegravir-cobicistat-emtricitabine-tenofovir 150-150-200-300 MG Tabs tablet  Commonly known as:  STRIBILD  Take 1 tablet by mouth daily with breakfast.     lisinopril-hydrochlorothiazide 20-25 MG tablet  Commonly known as:  PRINZIDE,ZESTORETIC  Take 1 tablet by mouth daily.     omeprazole 20 MG capsule  Commonly known as:  PRILOSEC  Take 1 capsule (20 mg total) by mouth daily.     oseltamivir 30 MG capsule  Commonly known as:  TAMIFLU  Take 1 capsule (30 mg total) by mouth 2 (two) times daily.     traMADol 50 MG tablet  Commonly known as:  ULTRAM  Take 1 tablet (50 mg total) by mouth every 6 (six) hours as needed.        Disposition and follow-up:   [name] was discharged from  [facility] in Good condition.  At the hospital follow up visit please address:  1.  Influenza B infection, Hypocalcemia, HIV, Transaminitis, HLD  2.  Labs / imaging needed at time of follow-up: CMP, 25-OH vitamin D, PTH, CD4, viral load  3.  Pending labs/ test needing follow-up: none   Follow-up Appointments:     Follow-up Information    Follow up with Sherin Quarry, MD On 05/05/2015.   Specialty:  Internal Medicine   Why:  2:45 PM   Contact information:   [address] [place] [address] [phone]       Consultations:    Procedures Performed:  Dg Chest 2 View  04/07/2015  CLINICAL DATA:  Cough and fever for 6 days EXAM: CHEST  2 VIEW COMPARISON:  None. FINDINGS: Lungs are clear. Heart size and pulmonary vascularity are normal. No adenopathy. There is atherosclerotic calcification in the aortic arch region. No bone lesions evident. IMPRESSION: No edema or consolidation. Electronically Signed   By: Lowella Grip III M.D.   On: 04/07/2015 09:46    Admission HPI: Jacob Ali is a very pleasant 55 year old man with past medical history of HIV (last CD4 952 on 9/16/1/6), hypertension, hyperlipidemia, GERD, and tobacco use who presents with flu-like  symptoms.   He reports feeling in his normal state of health until 5 days ago when he developed subjective fever, chills, rhinorrhea, nasal congestion, dry cough, dyspnea, chest tightness with coughing, wheezing, myalgias, and malaise. He then began to have watery non-bloody diarrhea two days ago with lower abdominal cramping and nausea (this morning) without vomiting. He had mild BRBPR in setting of his hemorrhoids. He has had decreased PO intake and ate rice and toast yesterday. He reports several sick contacts at his work. He did receive a flu shot this year. He has been taking ibuprofen 800 mg BID for 5 days and tramadol as needed with some relief. He denies urinary symptoms and reports normal urine output.  He is a current smoker with 0.5 ppd history for the past 40 years. He denies history of COPD and has never had PFT's.   His last CD4 count was 952 with undetectable viral load on 09/30/14. He is compliant with taking his HIV medications.   On admission to the ED his vital signs were stable with normal SpO2 on room air. Chest xray revealed no abnormality. He was found to have acute kidney injury with Cr 2.20 from baseline 0.9 with hypocalcemia (corrected calcium 7.4), hyponatremia to 132, and mildly elevated AST/ALT of 78/88. His WBC was normal at 5.4. He was given 2 L of NS, tylenol, and zofran.   Hospital Course by problem list: Principal Problem:   Influenza B Active Problems:   HIV (human immunodeficiency virus infection) ([place])   Hypertension   Hyperlipidemia   Hyponatremia   Tobacco use disorder   GERD (gastroesophageal reflux disease)   Hypocalcemia   Influenza B infection - Patient presented with a 5-day history of URI symptoms, dyspnea, diarrhea, and lower abdominal cramping in the setting of recent sick contacts. On admission, he was hypovolemic due to decreased PO intake/ diarrhea and found to have a soft BP and AKI (Cr 2.20 from baseline 0.9). Influenza panel came back positive for influenza B. Chest xray with no superimposed pneumonia. He was treated with IVF, Tamiflu, and bronchodilators. His AKI was though to be due to hypovolemia, ACEi use, and concomitant NSAID use prior to admission.  His dyspnea improved significantly. He had no oxygen requirement. Watery diarrhea improved and patient denied having any abdominal pain. Cr trended down to 1.14 prior to discharge and is expected to improve with improved PO intake. He has been advised to avoid taking NSAIDs as he is currently on an ACEi.  On the day of discharge, vitals were stable. Patient reported feeling better and was comfortable managing residual symptom at home.    Hypocalcemia - Patient with corrected calcium calcium 7.4 on  admission with normal prior calcium levels. Most likely transiently low in the setting of decreased PO intake.Expected to improve with increased PO intake. If no improvement during follow-up visit, consider checking 25-OH vitamin D and PTH level.    HIV Infection - Last CD4 count was 952 with undetectable viral load on 09/30/14. Currently on Stribild at home. He was continued on HIV medication (Genvoya due to formulary change) during this hospitalization. Pt needs to follow-up with Dr. Johnnye Sima, will defer CD4 and VL in setting of infection.  Transaminitis - LFTs found to be elevated. AST 78, AST 88. LFTs were normal in 09/2014. HCV antibody non-reactive. Transaminitis is likely in the setting of acute illness/ possible HIV medication adverse effect. There was a slight improvement in LFTs prior to discharge - AST 72, ALT 65. Please recheck  LFTs at follow-up visit.   Hyperlipidemia - Last lipid panel on 09/30/14 with LDL 96 and TG 183. Pt with 10 yr ASCVD >7.5%. Unclear why patient is on gemfibrozil as he has no history of hypertriglyceridemia. We discontinue home gemfibrozil 600 mg daily. Consider starting patient on a statin after LFTs normalize.   Discharge Vitals:   BP 143/78 mmHg  Pulse 74  Temp(Src) 98.5 F (36.9 C) (Oral)  Resp 17  Ht 5\' 9"  (1.753 m)  Wt 81.693 kg (180 lb 1.6 oz)  BMI 26.58 kg/m2  SpO2 96%  Discharge Labs:  Results for orders placed or performed during the hospital encounter of 04/07/15 (from the past 24 hour(s))  Urinalysis, Routine w reflex microscopic (not at [facility])     Status: Abnormal   Collection Time: 04/07/15 12:39 PM  Result Value Ref Range   Color, Urine YELLOW YELLOW   APPearance CLEAR CLEAR   Specific Gravity, Urine 1.011 1.005 - 1.030   pH 5.5 5.0 - 8.0   Glucose, UA NEGATIVE NEGATIVE mg/dL   Hgb urine dipstick TRACE (A) NEGATIVE   Bilirubin Urine NEGATIVE NEGATIVE   Ketones, ur NEGATIVE NEGATIVE mg/dL   Protein, ur NEGATIVE NEGATIVE mg/dL   Nitrite  NEGATIVE NEGATIVE   Leukocytes, UA NEGATIVE NEGATIVE  Urine microscopic-add on     Status: Abnormal   Collection Time: 04/07/15 12:39 PM  Result Value Ref Range   Squamous Epithelial / LPF 0-5 (A) NONE SEEN   WBC, UA 6-30 0 - 5 WBC/hpf   RBC / HPF 0-5 0 - 5 RBC/hpf   Bacteria, UA FEW (A) NONE SEEN   Casts HYALINE CASTS (A) NEGATIVE  Magnesium     Status: None   Collection Time: 04/08/15  5:16 AM  Result Value Ref Range   Magnesium 2.4 1.7 - 2.4 mg/dL  Comprehensive metabolic panel     Status: Abnormal   Collection Time: 04/08/15  5:16 AM  Result Value Ref Range   Sodium 139 135 - 145 mmol/L   Potassium 4.2 3.5 - 5.1 mmol/L   Chloride 107 101 - 111 mmol/L   CO2 24 22 - 32 mmol/L   Glucose, Bld 109 (H) 65 - 99 mg/dL   BUN 16 6 - 20 mg/dL   Creatinine, Ser 1.14 0.61 - 1.24 mg/dL   Calcium 7.7 (L) 8.9 - 10.3 mg/dL   Total Protein 6.4 (L) 6.5 - 8.1 g/dL   Albumin 3.8 3.5 - 5.0 g/dL   AST 72 (H) 15 - 41 U/L   ALT 65 (H) 17 - 63 U/L   Alkaline Phosphatase 46 38 - 126 U/L   Total Bilirubin 0.2 (L) 0.3 - 1.2 mg/dL   GFR calc non Af Amer >60 >60 mL/min   GFR calc Af Amer >60 >60 mL/min   Anion gap 8 5 - 15  Phosphorus     Status: Abnormal   Collection Time: 04/08/15  5:16 AM  Result Value Ref Range   Phosphorus 2.3 (L) 2.5 - 4.6 mg/dL    Signed: Shela Leff, MD 04/08/2015, 12:09 PM    Services Ordered on Discharge:  Equipment Ordered on Discharge:

## 2015-04-08 NOTE — Discharge Instructions (Signed)
Continue taking Tamiflu for another 3 days as instructed.   Do not return to work until 24 hours after resolution of fever.  Influenza, Adult Influenza (flu) is an infection in the mouth, nose, and throat (respiratory tract) caused by a virus. The flu can make you feel very ill. Influenza spreads easily from person to person (contagious).  HOME CARE   Only take medicines as told by your doctor.  Use a cool mist humidifier to make breathing easier.  Get plenty of rest until your fever goes away. This usually takes 3 to 4 days.  Drink enough fluids to keep your pee (urine) clear or pale yellow.  Cover your mouth and nose when you cough or sneeze.  Wash your hands well to avoid spreading the flu.  Stay home from work or school until your fever has been gone for at least 1 full day.  Get a flu shot every year. GET HELP RIGHT AWAY IF:   You have trouble breathing or feel short of breath.  Your skin or nails turn blue.  You have severe neck pain or stiffness.  You have a severe headache, facial pain, or earache.  Your fever gets worse or keeps coming back.  You feel sick to your stomach (nauseous), throw up (vomit), or have watery poop (diarrhea).  You have chest pain.  You have a deep cough that gets worse, or you cough up more thick spit (mucus). MAKE SURE YOU:   Understand these instructions.  Will watch your condition.  Will get help right away if you are not doing well or get worse.   This information is not intended to replace advice given to you by your health care provider. Make sure you discuss any questions you have with your health care provider.   Document Released: 10/10/2007 Document Revised: 01/21/2014 Document Reviewed: 04/01/2011 Elsevier Interactive Patient Education [employer].

## 2015-04-08 NOTE — Progress Notes (Signed)
pts o2 sat on RA  Was 96%, pt began ambulating approx 200 feet, in that time o2 sat levels ranged from 96-97%. Pt denied any SOB, dizziness or lightheadedness.

## 2015-04-08 NOTE — Progress Notes (Signed)
Subjective: Patient was seen and examined at bedside this morning. Reports feeling tired/ fatigued. States his breathing is better and abdominal pain has resolved. No other complaints.   Objective: Vital signs in last 24 hours: Filed Vitals:   04/07/15 2106 04/07/15 2210 04/08/15 0607 04/08/15 0734  BP:  133/66 143/78   Pulse: 78 80 74   Temp:  99.3 F (37.4 C) 98.5 F (36.9 C)   TempSrc:  Oral Oral   Resp: 18 16 17    Height:      Weight:      SpO2: 95% 97% 97% 96%   Weight change:   Intake/Output Summary (Last 24 hours) at 04/08/15 1030 Last data filed at 04/08/15 0432  Gross per 24 hour  Intake 1785.42 ml  Output   3125 ml  Net -1339.58 ml    Physical Exam: Constitutional: He is oriented to person, place, and time. He appears well-developed and well-nourished. No distress.  Cardiovascular: Normal rate, regular rhythm and normal heart sounds. Pulmonary/Chest: Effort normal and breath sounds normal. No respiratory distress. He has no wheezes. He has no rales.  Abdominal: Soft. Bowel sounds are normal. He exhibits no distension and no tenderness. There is no rebound and no guarding.  Musculoskeletal: Normal range of motion. He exhibits no edema or tenderness.  Neurological: He is alert and oriented to person, place, and time.  Skin: Skin is warm and dry. No rash noted. He is not diaphoretic. No erythema. No pallor.  Psychiatric: He has a normal mood and affect. His behavior is normal. Judgment and thought content normal.   Lab Results: Basic Metabolic Panel:  Recent Labs Lab 04/07/15 0930 04/08/15 0516  NA 132* 139  K 3.8 4.2  CL 103 107  CO2 17* 24  GLUCOSE 140* 109*  BUN 44* 16  CREATININE 2.20* 1.14  CALCIUM 7.6* 7.7*  MG  --  2.4  PHOS  --  2.3*   Liver Function Tests:  Recent Labs Lab 04/07/15 0930 04/08/15 0516  AST 78* 72*  ALT 88* 65*  ALKPHOS 52 46  BILITOT 0.4 0.2*  PROT 7.1 6.4*  ALBUMIN 4.3 3.8    Recent Labs Lab 04/07/15 0930    LIPASE 32   CBC:  Recent Labs Lab 04/07/15 0930  WBC 5.4  NEUTROABS 2.8  HGB 13.7  HCT 41.0  MCV 99.8  PLT 196   Urine Drug Screen: Drugs of Abuse     Component Value Date/Time   LABOPIA NONE DETECTED 02/20/2011 1621   COCAINSCRNUR NONE DETECTED 02/20/2011 1621   LABBENZ NONE DETECTED 02/20/2011 1621   AMPHETMU NONE DETECTED 02/20/2011 1621   THCU NONE DETECTED 02/20/2011 1621   LABBARB NONE DETECTED 02/20/2011 1621    Urinalysis:  Recent Labs Lab 04/07/15 1239  COLORURINE YELLOW  LABSPEC 1.011  PHURINE 5.5  GLUCOSEU NEGATIVE  HGBUR TRACE*  BILIRUBINUR NEGATIVE  KETONESUR NEGATIVE  PROTEINUR NEGATIVE  NITRITE NEGATIVE  LEUKOCYTESUR NEGATIVE   Studies/Results: Dg Chest 2 View  04/07/2015  CLINICAL DATA:  Cough and fever for 6 days EXAM: CHEST  2 VIEW COMPARISON:  None. FINDINGS: Lungs are clear. Heart size and pulmonary vascularity are normal. No adenopathy. There is atherosclerotic calcification in the aortic arch region. No bone lesions evident. IMPRESSION: No edema or consolidation. Electronically Signed   By: Lowella Grip III M.D.   On: 04/07/2015 09:46   Medications: I have reviewed the patient's current medications. Scheduled Meds: . benzonatate  100 mg Oral BID  . elvitegravir-cobicistat-emtricitabine-tenofovir  1 tablet Oral Q breakfast  . enoxaparin (LOVENOX) injection  40 mg Subcutaneous Q24H  . feeding supplement (ENSURE ENLIVE)  237 mL Oral BID BM  . ipratropium-albuterol  3 mL Nebulization TID  . Melatonin  1.5 mg Oral QHS  . nicotine  21 mg Transdermal Daily  . oseltamivir  30 mg Oral BID  . pantoprazole  40 mg Oral Daily   Continuous Infusions:  PRN Meds:.acetaminophen **OR** acetaminophen, ondansetron (ZOFRAN) IV, traMADol Assessment/Plan: Principal Problem:   Influenza B Active Problems:   HIV (human immunodeficiency virus infection) ([facility])   Hypertension   Hyperlipidemia   Hyponatremia   Tobacco use disorder   GERD  (gastroesophageal reflux disease)   Hypocalcemia  Influenza B infection - Pt with 5-day history of URI symptoms, dyspnea, diarrhea, and lower abdominal cramping most likely due to influenza virus infection in setting of recent sick contacts. Chest xray with no superimposed pneumonia. He has no oxygen requirement. Watery diarrhea is slowly improving, denies having any abdominal pain. Denies having any dyspnea. Vitals are stable - temp 98.5 this morning. Patient reports feeling better and is comfortable managing residual symptom at home. He will likely be discharged today.   -Continue Tamiflu 30 mg BID for a total of 5 days  -Zofran 4 mg Q 6 hr PRN nausea/vomitng  -Duonebs TID for acute bronchospasm -Tessalon 100 mg BID for cough -Tylenol 650 mg Q 6 hr PRN fever/pain -Continue home tramadol 100 mg BID PRN pain  -Ambulate with SpO2   Non-oliguric AKI (resolved) - Pt with Cr 2.20 from baseline 0.9 most likely pre-renal azotemia in setting of hypovolemia from decreased PO intake/diarrhea and concomitant NSAID use for the past 5 days on top of ACEi use. Cr 1.14 this morning.  -Encourage PO intake  -Avoid nephrotoxins -Avoid NSAIDs as patient is on an ACEi  Hypocalcemia - Pt with corrected calcium calcium 7.4 on admission with normal prior calcium levels. Most likely transiently low in setting of decreased PO intake. -Expected to improve with increased PO intake  -If not improved consider checking 25-OH vitamin D and PTH level   HIV Infection - Last CD4 count was 952 with undetectable viral load on 09/30/14. He reports compliance with taking his HIV medications.  -Genvoya 150-150-200-10 mg daily (formulary change from home stribilid) -Pt needs to follow-up with Dr. Johnnye Sima, will defer CD4 and VL in setting of infection -HCV Ab pending   Hypertension - BP 143/78 today.  -Resume home lisinopril-HCTZ 20-25 mg daily  Hyperlipidemia - Last lipid panel on 09/30/14 with LDL 96 and TG 183. Pt with  10 yr ASCVD >7.5%. Unclear why patient is on gemfibrozil as he has no history of hypertriglyceridemia.  -Discontinue home gemfibrozil 600 mg daily -Consider starting pravastatin 20 mg daily after LFT's normalize   GERD - Currently stable. Pt at home on prilosec 20 mg daily.  -Protonix 40 mg daily  Tobacco Use - Pt with 0.5 ppd cigarette use for the past 40 years. -Nicotine 21 mg patch daily -Counsel on cessation  Diet: Regular  DVT Ppx: Lovenox  Code: Full  Dispo: Disposition is deferred at this time, awaiting improvement of current medical problems.  Anticipated discharge in approximately 1 day(s).   The patient does have a current PCP Campbell Riches, MD) and does need an [facility] hospital follow-up appointment after discharge.  The patient does not have transportation limitations that hinder transportation to clinic appointments.  .Services Needed at time of discharge: Y = Yes, Blank = No  PT:   OT:   RN:   Equipment:   Other:       Shela Leff, MD 04/08/2015, 10:30 AM

## 2015-04-09 LAB — HEPATITIS C ANTIBODY (REFLEX): HCV Ab: 0.1 s/co ratio (ref 0.0–0.9)

## 2015-04-09 LAB — HCV COMMENT:

## 2015-05-04 ENCOUNTER — Telehealth: Payer: Self-pay | Admitting: Infectious Diseases

## 2015-05-04 NOTE — Telephone Encounter (Signed)
APPT. REMINDER CALL, LMTCB °

## 2015-05-05 ENCOUNTER — Ambulatory Visit (INDEPENDENT_AMBULATORY_CARE_PROVIDER_SITE_OTHER): Payer: PRIVATE HEALTH INSURANCE | Admitting: Internal Medicine

## 2015-05-05 ENCOUNTER — Encounter: Payer: Self-pay | Admitting: Internal Medicine

## 2015-05-05 VITALS — BP 127/76 | HR 99 | Temp 98.2°F | Ht 69.0 in | Wt 179.9 lb

## 2015-05-05 DIAGNOSIS — J101 Influenza due to other identified influenza virus with other respiratory manifestations: Secondary | ICD-10-CM

## 2015-05-05 DIAGNOSIS — F17210 Nicotine dependence, cigarettes, uncomplicated: Secondary | ICD-10-CM

## 2015-05-05 DIAGNOSIS — Z09 Encounter for follow-up examination after completed treatment for conditions other than malignant neoplasm: Secondary | ICD-10-CM | POA: Diagnosis not present

## 2015-05-05 DIAGNOSIS — Z8619 Personal history of other infectious and parasitic diseases: Secondary | ICD-10-CM | POA: Diagnosis not present

## 2015-05-05 DIAGNOSIS — E785 Hyperlipidemia, unspecified: Secondary | ICD-10-CM | POA: Diagnosis not present

## 2015-05-05 DIAGNOSIS — I10 Essential (primary) hypertension: Secondary | ICD-10-CM

## 2015-05-05 DIAGNOSIS — F17200 Nicotine dependence, unspecified, uncomplicated: Secondary | ICD-10-CM

## 2015-05-05 NOTE — Patient Instructions (Addendum)
1. STOP taking Gemfibrozil. 2. Return to clinic in 2 weeks.  Atorvastatin tablets What is this medicine? ATORVASTATIN (a TORE va sta tin) is known as a HMG-CoA reductase inhibitor or 'statin'. It lowers the level of cholesterol and triglycerides in the blood. This drug may also reduce the risk of heart attack, stroke, or other health problems in patients with risk factors for heart disease. Diet and lifestyle changes are often used with this drug. This medicine may be used for other purposes; ask your health care provider or pharmacist if you have questions. What should I tell my health care provider before I take this medicine? They need to know if you have any of these conditions: -frequently drink alcoholic beverages -history of stroke, TIA -kidney disease -liver disease -muscle aches or weakness -other medical condition -an unusual or allergic reaction to atorvastatin, other medicines, foods, dyes, or preservatives -pregnant or trying to get pregnant -breast-feeding How should I use this medicine? Take this medicine by mouth with a glass of water. Follow the directions on the prescription label. You can take this medicine with or without food. Take your doses at regular intervals. Do not take your medicine more often than directed. Talk to your pediatrician regarding the use of this medicine in children. While this drug may be prescribed for children as young as 17 years old for selected conditions, precautions do apply. Overdosage: If you think you have taken too much of this medicine contact a poison control center or emergency room at once. NOTE: This medicine is only for you. Do not share this medicine with others. What if I miss a dose? If you miss a dose, take it as soon as you can. If it is almost time for your next dose, take only that dose. Do not take double or extra doses. What may interact with this medicine? Do not take this medicine with any of the following  medications: -red yeast rice -telaprevir -telithromycin -voriconazole This medicine may also interact with the following medications: -alcohol -antiviral medicines for HIV or AIDS -boceprevir -certain antibiotics like clarithromycin, erythromycin, troleandomycin -certain medicines for cholesterol like fenofibrate or gemfibrozil -cimetidine -clarithromycin -colchicine -cyclosporine -digoxin -male hormones, like estrogens or progestins and birth control pills -grapefruit juice -medicines for fungal infections like fluconazole, itraconazole, ketoconazole -niacin -rifampin -spironolactone This list may not describe all possible interactions. Give your health care provider a list of all the medicines, herbs, non-prescription drugs, or dietary supplements you use. Also tell them if you smoke, drink alcohol, or use illegal drugs. Some items may interact with your medicine. What should I watch for while using this medicine? Visit your doctor or health care professional for regular check-ups. You may need regular tests to make sure your liver is working properly. Tell your doctor or health care professional right away if you get any unexplained muscle pain, tenderness, or weakness, especially if you also have a fever and tiredness. Your doctor or health care professional may tell you to stop taking this medicine if you develop muscle problems. If your muscle problems do not go away after stopping this medicine, contact your health care professional. This drug is only part of a total heart-health program. Your doctor or a dietician can suggest a low-cholesterol and low-fat diet to help. Avoid alcohol and smoking, and keep a proper exercise schedule. Do not use this drug if you are pregnant or breast-feeding. Serious side effects to an unborn child or to an infant are possible. Talk to your doctor or  pharmacist for more information. This medicine may affect blood sugar levels. If you have  diabetes, check with your doctor or health care professional before you change your diet or the dose of your diabetic medicine. If you are going to have surgery tell your health care professional that you are taking this drug. What side effects may I notice from receiving this medicine? Side effects that you should report to your doctor or health care professional as soon as possible: -allergic reactions like skin rash, itching or hives, swelling of the face, lips, or tongue -dark urine -fever -joint pain -muscle cramps, pain -redness, blistering, peeling or loosening of the skin, including inside the mouth -trouble passing urine or change in the amount of urine -unusually weak or tired -yellowing of eyes or skin Side effects that usually do not require medical attention (report to your doctor or health care professional if they continue or are bothersome): -constipation -heartburn -stomach gas, pain, upset This list may not describe all possible side effects. Call your doctor for medical advice about side effects. You may report side effects to FDA at 1-800-FDA-1088. Where should I keep my medicine? Keep out of the reach of children. Store at room temperature between 20 to 25 degrees C (68 to 77 degrees F). Throw away any unused medicine after the expiration date. NOTE: This sheet is a summary. It may not cover all possible information. If you have questions about this medicine, talk to your doctor, pharmacist, or health care provider.    2016, Elsevier/Gold Standard. (2010-11-20 [id number])

## 2015-05-05 NOTE — Progress Notes (Signed)
Patient ID: Jacob Ali, male   DOB: Jul 28, 1960, 55 y.o.   MRN: [id number]   Subjective:   Patient ID: Jacob Ali male   DOB: May 13, 1960 55 y.o.   MRN: [id number]  HPI: [name] is a 55 y.o. male with PMH as below, here for hospital f/u of influenza B and AKI.  Please see Problem-Based charting for the status of the patient's chronic medical issues.  He reports feeling "the best I've ever felt" since being discharge.  He denies chest pain, cough, SOB, trouble urinating, constipation, or diarrhea.  He has intermittent loose stools, which have been chronic since starting Stribild, but he prefers it this way.  He found at after discharge that there was a Rotavirus epidemic going around his work, and believed he may have had both Rotavirus and Flu when he was admitted.    He was supposed to stop his Gemfibrozil at discharge, but he has continued to take it.   Past Medical History  Diagnosis Date  . HIV infection ([place])   . Hyperlipidemia   . Hypertension   . PONV (postoperative nausea and vomiting)    Current Outpatient Prescriptions  Medication Sig Dispense Refill  . acetaminophen (TYLENOL) 500 MG tablet Take 1 tablet (500 mg total) by mouth every 6 (six) hours as needed for mild pain (or Fever >/= 101). 20 tablet 0  . albuterol (PROVENTIL HFA) 108 (90 Base) MCG/ACT inhaler Inhale 1-2 puffs into the lungs every 6 (six) hours as needed for wheezing or shortness of breath. 18 g 0  . benzonatate (TESSALON) 100 MG capsule Take 1 capsule (100 mg total) by mouth 2 (two) times daily as needed for cough. 10 capsule 0  . elvitegravir-cobicistat-emtricitabine-tenofovir (STRIBILD) 150-150-200-300 MG TABS tablet Take 1 tablet by mouth daily with breakfast. 90 tablet 6  . lisinopril-hydrochlorothiazide (PRINZIDE,ZESTORETIC) 20-25 MG tablet Take 1 tablet by mouth daily. 90 tablet 3  . omeprazole (PRILOSEC) 20 MG capsule Take 1 capsule (20 mg total) by mouth daily. 90 capsule 3  . traMADol  (ULTRAM) 50 MG tablet Take 1 tablet (50 mg total) by mouth every 6 (six) hours as needed. 25 tablet 0   No current facility-administered medications for this visit.   Family History  Problem Relation Age of Onset  . Throat cancer Father   . Heart disease Father   . Cancer Maternal Grandmother     colon  . Cancer Father     bladder   Social History   Social History  . Marital Status: Single    Spouse Name: N/A  . Number of Children: N/A  . Years of Education: 12   Occupational History  . [place] History Main Topics  . Smoking status: Current Every Day Smoker -- 0.50 packs/day for 40 years    Types: Cigarettes  . Smokeless tobacco: Never Used     Comment: 1/2PPD  . Alcohol Use: 0.6 oz/week    1 Standard drinks or equivalent per week     Comment: beer occasional  . Drug Use: 2.00 per week    Special: Marijuana  . Sexual Activity: Not Currently     Comment: pt. declined condoms   Other Topics Concern  . None   Social History Narrative   Single, lives alone.     Review of Systems: Pertinent items are noted in HPI. Objective:  Physical Exam: Filed Vitals:   05/05/15 1449  BP: 127/76  Pulse: 99  Temp: 98.2  F (36.8 C)  TempSrc: Oral  Height: 5\' 9"  (1.753 m)  Weight: 179 lb 14.4 oz (81.602 kg)  SpO2: 92%   Physical Exam  Constitutional: He is oriented to person, place, and time and well-developed, well-nourished, and in no distress. No distress.  HENT:  Head: Normocephalic and atraumatic.  Eyes: EOM are normal. No scleral icterus.  Neck: No tracheal deviation present.  Cardiovascular: Normal rate, regular rhythm and normal heart sounds.   Pulmonary/Chest: Effort normal and breath sounds normal. No stridor. No respiratory distress. He has no wheezes.  Mildly prolonged expiratory phase.  Abdominal: Soft. He exhibits no distension. There is no tenderness. There is no rebound and no guarding.  Musculoskeletal: He exhibits no  edema.  Neurological: He is alert and oriented to person, place, and time.  Skin: Skin is warm and dry. He is not diaphoretic.     Assessment & Plan:   Patient and case were discussed with Dr. Beryle Beams.  Please refer to Problem Based charting for further documentation.

## 2015-05-05 NOTE — Assessment & Plan Note (Signed)
Patient is post-discharge for influenza and probably rotavirus infection.  He denies current concern.  His LFTs were noted to be elevated during admission, likely 2/2 acute viral infection.   Will recheck LFTs and Ca today. - LFTs, BMP

## 2015-05-05 NOTE — Assessment & Plan Note (Addendum)
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  This was supposed to be stopped on discharge with outpatient initiation of statin.  However, patient has continued to take the Gemfibrozil.    A/P: HLD.  Discharge summary recommends switching patient to statin. Unfortunately, there is a warning about combining statin therapy with patient's HIV medication.  Therefore, will simply stop Gemfibrozil today and check lipid panel.  Will defer to Dr. Johnnye Sima regarding choice of statin. Patient also interested in entering research studies through the HIV office re: lipids and HIV. - STOP Gemfibrozil

## 2015-05-05 NOTE — Progress Notes (Signed)
Medicine attending: Medical history, presenting problems, physical findings, and medications, reviewed with resident physician Dr Laureen Abrahams on the day of the patient visit and I concur with his evaluation and management plan.

## 2015-05-05 NOTE — Assessment & Plan Note (Signed)
Patient continues to smoke 1/2 ppd of cigarillos.  He has previously tried patches and gum.  He is precontemplative about stopping tobacco use.  A/P: Tobacco use disorder.  Patient shown his ASCVD risk, with 15% 10 year risk versus 8% if he were a nonsmoker.  He was counseled extensively on the need to stop and that whenever he is ready, the [facility] and ID will be happy to help him quit. - CTM

## 2015-05-05 NOTE — Assessment & Plan Note (Signed)
BP Readings from Last 3 Encounters:  05/05/15 127/76  04/08/15 143/78  03/06/15 128/80    Lab Results  Component Value Date   NA 139 04/08/2015   K 4.2 04/08/2015   CREATININE 1.14 04/08/2015    Assessment: Blood pressure control:  good Progress toward BP goal:    Comments: patient had AKI during recent admission.  Plan: Medications:  continue current medications, Lisinopril-HCTZ Educational resources provided:  (DECLINED) Self management tools provided:  (DECLINED) Other plans: recheck BMP

## 2015-05-06 LAB — BMP8+ANION GAP
Anion Gap: 18 mmol/L (ref 10.0–18.0)
BUN/Creatinine Ratio: 15 (ref 9–20)
BUN: 16 mg/dL (ref 6–24)
CO2: 23 mmol/L (ref 18–29)
Calcium: 10 mg/dL (ref 8.7–10.2)
Chloride: 99 mmol/L (ref 96–106)
Creatinine, Ser: 1.09 mg/dL (ref 0.76–1.27)
GFR calc Af Amer: 87 mL/min/{1.73_m2} (ref >59)
GFR calc non Af Amer: 75 mL/min/{1.73_m2} (ref >59)
Glucose: 90 mg/dL (ref 65–99)
Potassium: 4.1 mmol/L (ref 3.5–5.2)
Sodium: 140 mmol/L (ref 134–144)

## 2015-05-06 LAB — LIPID PANEL
Chol/HDL Ratio: 7.1 ratio units — ABNORMAL HIGH (ref 0.0–5.0)
Cholesterol, Total: 164 mg/dL (ref 100–199)
HDL: 23 mg/dL — ABNORMAL LOW (ref >39)
LDL Calculated: 97 mg/dL (ref 0–99)
Triglycerides: 220 mg/dL — ABNORMAL HIGH (ref 0–149)
VLDL Cholesterol Cal: 44 mg/dL — ABNORMAL HIGH (ref 5–40)

## 2015-05-06 LAB — HEPATIC FUNCTION PANEL
ALT: 39 IU/L (ref 0–44)
AST: 34 IU/L (ref 0–40)
Albumin: 4.7 g/dL (ref 3.5–5.5)
Alkaline Phosphatase: 60 IU/L (ref 39–117)
Bilirubin Total: 0.5 mg/dL (ref 0.0–1.2)
Bilirubin, Direct: 0.16 mg/dL (ref 0.00–0.40)
Total Protein: 7.1 g/dL (ref 6.0–8.5)

## 2015-05-17 ENCOUNTER — Encounter (INDEPENDENT_AMBULATORY_CARE_PROVIDER_SITE_OTHER): Payer: PRIVATE HEALTH INSURANCE | Admitting: *Deleted

## 2015-05-17 VITALS — BP 136/80 | HR 87 | Temp 99.0°F | Resp 16 | Wt 174.5 lb

## 2015-05-17 DIAGNOSIS — Z006 Encounter for examination for normal comparison and control in clinical research program: Secondary | ICD-10-CM

## 2015-05-17 LAB — CD4/CD8 (T-HELPER/T-SUPPRESSOR CELL)
CD4%: 32.3
CD4: 1550

## 2015-05-18 NOTE — Progress Notes (Signed)
Jacob Ali is here today for the START study. He had been hospitalized 1 day in March when he got the flu and was dehydrated. He says he feels much better now and has been able to keep off any weight he lost during that time. He had questions about taking his lopid. He said the IM doctor told him he needed to stop it but was unsure of what statin to put him on, because of interactions with his ARVs. He is also interested in the Reprieve study but he would need to be off the lopid for at least 90 days before entering the study. He wants to continue the lopid and talk to Dr. Johnnye Sima about it at his appt. In August and then possibly screen for Reprieve when he comes back for his final START visit in November.

## 2015-05-19 ENCOUNTER — Encounter: Payer: Self-pay | Admitting: Internal Medicine

## 2015-05-19 ENCOUNTER — Ambulatory Visit (INDEPENDENT_AMBULATORY_CARE_PROVIDER_SITE_OTHER): Payer: PRIVATE HEALTH INSURANCE | Admitting: Internal Medicine

## 2015-05-19 VITALS — BP 154/85 | HR 74 | Temp 98.6°F | Ht 69.0 in | Wt 187.6 lb

## 2015-05-19 DIAGNOSIS — I10 Essential (primary) hypertension: Secondary | ICD-10-CM | POA: Diagnosis not present

## 2015-05-19 DIAGNOSIS — M2550 Pain in unspecified joint: Secondary | ICD-10-CM

## 2015-05-19 DIAGNOSIS — Z21 Asymptomatic human immunodeficiency virus [HIV] infection status: Secondary | ICD-10-CM

## 2015-05-19 DIAGNOSIS — F17200 Nicotine dependence, unspecified, uncomplicated: Secondary | ICD-10-CM

## 2015-05-19 DIAGNOSIS — B20 Human immunodeficiency virus [HIV] disease: Secondary | ICD-10-CM

## 2015-05-19 DIAGNOSIS — E785 Hyperlipidemia, unspecified: Secondary | ICD-10-CM

## 2015-05-19 DIAGNOSIS — Z79899 Other long term (current) drug therapy: Secondary | ICD-10-CM

## 2015-05-19 DIAGNOSIS — M25522 Pain in left elbow: Secondary | ICD-10-CM | POA: Diagnosis not present

## 2015-05-19 LAB — HIV-1 RNA QUANT-NO REFLEX-BLD
HIV 1 RNA Quant: 30 copies/mL — ABNORMAL HIGH (ref <20)
HIV-1 RNA Quant, Log: 1.48 Log copies/mL — ABNORMAL HIGH (ref <1.30)

## 2015-05-19 NOTE — Progress Notes (Signed)
   Subjective:    Patient ID: Jacob Ali, male    DOB: 06-25-1960, 55 y.o.   MRN: [id number]  HPI Jacob Ali is a 55yo man with PMHx of HTN, HIV, tobacco abuse, depression/anxiety, and GERD who presents today for follow up of his hypertension.  HTN: BP mildly elevated at 154/85 today. Repeat BP was 160/100. He is currently taking Lisinopril-HCTZ 20-25 mg daily.   Tobacco Abuse: He reports he is still smoking 1/2 pack per day. He has been smoking since age 55. He reports he has tried the nicotine patch but had strange dreams. He does not like the taste of the nicotine gum. He states he would like to try to quit "cold [place]."  HIV: Last VL 30 on 05/17/15, last CD4 952 on 09/30/14. He is currently on Stribild. Plans are to switch to [facility] per Dr. Algis Downs last note. He plans to see Dr. Johnnye Sima again in August.  Hyperlipidemia: Last lipid panel from 4/21 shows Chol 164, Trigly 220, HDL 23, and LDL 97. He is currently on Gemfibrozil. He is awaiting to see what statin he can be started on when he starts his new HIV medication.   Left Elbow Pain: He reports his elbow pain started back in October 2016. He notes he only has pain when he touches a certain spot on his elbow. He denies any trauma to the area. He states Dr. Johnnye Sima gave him a prescription for Tramadol which he takes very rarely. He states his prescription of 25 tablets has lasted him for 2 months.    Review of Systems General: Denies fever, chills, night sweats, changes in weight, changes in appetite HEENT: Denies headaches, ear pain, changes in vision, rhinorrhea, sore throat CV: Denies CP, palpitations, SOB, orthopnea Pulm: Denies SOB, cough, wheezing GI: Denies abdominal pain, nausea, vomiting, diarrhea, constipation, melena, hematochezia GU: Denies dysuria, hematuria, frequency Msk: Denies muscle cramps Neuro: Denies weakness, numbness, tingling Skin: Denies rashes, bruising Psych: Denies hallucinations    Objective:   Physical Exam General: alert, sitting up, NAD HEENT: /AT, EOMI, sclera anicteric, mucus membranes moist CV: RRR, no m/g/r Pulm: CTA bilaterally, breaths non-labored Abd: BS+, soft, obese, non-tender Ext: warm, no peripheral edema. Left elbow with no tenderness to palpation, no swelling of joint.  Neuro: alert and oriented x 3, no focal deficits    Assessment & Plan:  Please refer to A&P documentation.

## 2015-05-19 NOTE — Assessment & Plan Note (Signed)
He has not done well smoking cessation agents such as the patch and gum. He is not a good candidate for Chantix or Wellbutrin with his suicide attempt history. I encouraged him to try to quit cold [place].

## 2015-05-19 NOTE — Patient Instructions (Addendum)
General Instructions: - Please monitor your blood pressure - You can continue taking Gemfibrozil. Please stop when a statin is started. - Strongly suggest to stop smoking  Please bring your medicines with you each time you come to clinic.  Medicines may include prescription medications, over-the-counter medications, herbal remedies, eye drops, vitamins, or other pills.   Progress Toward Treatment Goals:  No flowsheet data found.  Self Care Goals & Plans:  Self Care Goal 05/05/2015  Manage my medications take my medicines as prescribed; bring my medications to every visit; refill my medications on time  Eat healthy foods eat more vegetables; eat foods that are low in salt; eat baked foods instead of fried foods    No flowsheet data found.   Care Management & Community Referrals:  No flowsheet data found.

## 2015-05-19 NOTE — Assessment & Plan Note (Signed)
Patient asked for a refill for Tramadol for his left elbow pain. I explained that with our clinic we typically have patients sign a pain contract in order to receive opioids. He did not seem interested in this at all, stating that he was not a drug addict and he is not abusing Tramadol. I explained that I was not accusing him of this, but the purpose of the contract is for his safety and mine. He did not seem interested once I explained that he can only receive opioids from our clinic and would be subject to random urine drug testing.

## 2015-05-19 NOTE — Assessment & Plan Note (Signed)
I advised patient it was ok to continue Gemfibrozil while he awaits switching to a different antiviral for his HIV. Once he starts a statin he should stop Gemfibrozil.

## 2015-05-19 NOTE — Assessment & Plan Note (Signed)
BP mildly elevated. I wanted to start a new antihypertensive today but patient was adamant that he did not want that. In fact, he did not seem interested in following in our clinic at all. He asked what the purpose of a primary care doctor was and why he should continue coming to see me. He states Dr. Johnnye Sima provides all the care he needs, including labs, and what would I have to offer. I explained that it is good to have a PCP as Dr. Algis Downs specialty is ID and if something arises that is out of his scope of practice that it is good to have a PCP who can help with many medical issues. He stated "I do not see the point of coming here." I advised him to have a discussion with Dr. Johnnye Sima in regards to Dr. Johnnye Sima being his PCP and if Dr. Johnnye Sima would be comfortable with that. He was rude several times during our conversation, making statements as "I don't see why I should come to this clinic, I can afford a real doctor."

## 2015-05-19 NOTE — Assessment & Plan Note (Signed)
Well controlled. Will see Dr. Johnnye Sima in August.

## 2015-05-22 NOTE — Progress Notes (Signed)
Internal Medicine Clinic Attending  Case discussed with Dr. Arcelia Jew soon after the resident saw the patient.  We reviewed the resident's history and exam and pertinent patient test results.  I agree with the assessment, diagnosis, and plan of care documented in the resident's note. As Jacob Ali was not receptive to the idea of having a PCP, we have been removed as his PCP. Dr Johnnye Sima notified.

## 2015-05-29 ENCOUNTER — Encounter: Payer: Self-pay | Admitting: Infectious Diseases

## 2015-06-05 ENCOUNTER — Other Ambulatory Visit: Payer: Self-pay | Admitting: Family Medicine

## 2015-06-06 LAB — CMP12+LP+TP+TSH+6AC+PSA+CBC…
ALT: 69 IU/L — ABNORMAL HIGH (ref 0–44)
AST: 43 IU/L — ABNORMAL HIGH (ref 0–40)
Albumin/Globulin Ratio: 1.9 (ref 1.2–2.2)
Albumin: 4.8 g/dL (ref 3.5–5.5)
Alkaline Phosphatase: 49 IU/L (ref 39–117)
BUN/Creatinine Ratio: 17 (ref 9–20)
BUN: 15 mg/dL (ref 6–24)
Basophils Absolute: 0.1 10*3/uL (ref 0.0–0.2)
Basos: 1 %
Bilirubin Total: 0.6 mg/dL (ref 0.0–1.2)
Calcium: 9.1 mg/dL (ref 8.7–10.2)
Chloride: 102 mmol/L (ref 96–106)
Chol/HDL Ratio: 6.2 ratio units — ABNORMAL HIGH (ref 0.0–5.0)
Cholesterol, Total: 154 mg/dL (ref 100–199)
Creatinine, Ser: 0.88 mg/dL (ref 0.76–1.27)
EOS (ABSOLUTE): 0.2 10*3/uL (ref 0.0–0.4)
Eos: 3 %
Estimated CHD Risk: 1.3 times avg. — ABNORMAL HIGH (ref 0.0–1.0)
Free Thyroxine Index: 1.5 (ref 1.2–4.9)
GFR calc Af Amer: 111 mL/min/{1.73_m2} (ref >59)
GFR calc non Af Amer: 96 mL/min/{1.73_m2} (ref >59)
GGT: 28 IU/L (ref 0–65)
Globulin, Total: 2.5 g/dL (ref 1.5–4.5)
Glucose: 98 mg/dL (ref 65–99)
HDL: 25 mg/dL — ABNORMAL LOW (ref >39)
Hematocrit: 39 % (ref 37.5–51.0)
Hemoglobin: 12.8 g/dL (ref 12.6–17.7)
Immature Grans (Abs): 0 10*3/uL (ref 0.0–0.1)
Immature Granulocytes: 0 %
Iron: 99 ug/dL (ref 38–169)
LDH: 216 IU/L (ref 121–224)
LDL Calculated: 101 mg/dL — ABNORMAL HIGH (ref 0–99)
Lymphocytes Absolute: 3.1 10*3/uL (ref 0.7–3.1)
Lymphs: 41 %
MCH: 33.1 pg — ABNORMAL HIGH (ref 26.6–33.0)
MCHC: 32.8 g/dL (ref 31.5–35.7)
MCV: 101 fL — ABNORMAL HIGH (ref 79–97)
Monocytes Absolute: 0.7 10*3/uL (ref 0.1–0.9)
Monocytes: 9 %
Neutrophils Absolute: 3.5 10*3/uL (ref 1.4–7.0)
Neutrophils: 46 %
Phosphorus: 2.8 mg/dL (ref 2.5–4.5)
Platelets: 246 10*3/uL (ref 150–379)
Potassium: 4.8 mmol/L (ref 3.5–5.2)
Prostate Specific Ag, Serum: 0.9 ng/mL (ref 0.0–4.0)
RBC: 3.87 x10E6/uL — ABNORMAL LOW (ref 4.14–5.80)
RDW: 14 % (ref 12.3–15.4)
Sodium: 139 mmol/L (ref 134–144)
T3 Uptake Ratio: 26 % (ref 24–39)
T4, Total: 5.6 ug/dL (ref 4.5–12.0)
TSH: 1.36 u[IU]/mL (ref 0.450–4.500)
Total Protein: 7.3 g/dL (ref 6.0–8.5)
Triglycerides: 142 mg/dL (ref 0–149)
Uric Acid: 6.7 mg/dL (ref 3.7–8.6)
VLDL Cholesterol Cal: 28 mg/dL (ref 5–40)
WBC: 7.5 10*3/uL (ref 3.4–10.8)

## 2015-06-06 LAB — HGB A1C W/O EAG: Hgb A1c MFr Bld: 4.4 % — ABNORMAL LOW (ref 4.8–5.6)

## 2015-06-06 LAB — VITAMIN D 25 HYDROXY (VIT D DEFICIENCY, FRACTURES): Vit D, 25-Hydroxy: 24.8 ng/mL — ABNORMAL LOW (ref 30.0–100.0)

## 2015-08-21 ENCOUNTER — Ambulatory Visit (INDEPENDENT_AMBULATORY_CARE_PROVIDER_SITE_OTHER): Payer: Self-pay | Admitting: Infectious Diseases

## 2015-08-21 ENCOUNTER — Encounter: Payer: Self-pay | Admitting: Infectious Diseases

## 2015-08-21 DIAGNOSIS — Z21 Asymptomatic human immunodeficiency virus [HIV] infection status: Secondary | ICD-10-CM

## 2015-08-21 DIAGNOSIS — I10 Essential (primary) hypertension: Secondary | ICD-10-CM

## 2015-08-21 DIAGNOSIS — F17200 Nicotine dependence, unspecified, uncomplicated: Secondary | ICD-10-CM

## 2015-08-21 DIAGNOSIS — B20 Human immunodeficiency virus [HIV] disease: Secondary | ICD-10-CM

## 2015-08-21 NOTE — Assessment & Plan Note (Signed)
Patient reports an attempt to quit smoking, but was unsuccessful. Educated patient on health benefits of smoking cessation (risk reduction for stroke, heart disease, and cancer). Will continue to monitor.

## 2015-08-21 NOTE — Assessment & Plan Note (Addendum)
Patient remains in research study. He reports compliance to Stribild and has not missed a dose in the past month. Tolerating medications well without side effects. Will continue Stribild and switch to Genvoya at next visit. Offered/refused condoms. RTC in 5 months.

## 2015-08-21 NOTE — Assessment & Plan Note (Signed)
BP well controlled on current regimen. Checks BP at home/work "few times a year". Provided patient referral to Primary Care in [facility].  Will continue to monitor.

## 2015-08-21 NOTE — Progress Notes (Signed)
   Subjective:    Patient ID: Jacob Ali, male    DOB: April 07, 1960, 55 y.o.   MRN: [id number]  HPI 55 y.o. Male diagnosed with HIV September 2012.  Hx of HTN, hyperlipidemia, hospitalization for tylenol overdose and cutting of wrists on 02/20/11.  Patient reports adherence to Gemfibrozil, lisinopril/HCTZ and Stribild.  Reports no missed doses of his ART in the past 4 weeks.  He presents to clinic for routine follow-up without chief complaints.  Lab Results  Component Value Date   CD4TABS 1,550 05/17/2015   CD4TABS 952 09/30/2014   CD4TABS 1,010 04/01/2014   Lab Results  Component Value Date   HIV1RNAQUANT 30 (H) 05/17/2015   Review of Systems  Constitutional: Negative for chills and fever.  HENT: Negative for mouth sores, sore throat and trouble swallowing.   Respiratory: Negative for chest tightness and shortness of breath.   Cardiovascular: Negative for chest pain.  Gastrointestinal: Negative for abdominal pain, constipation, diarrhea and vomiting.  Genitourinary: Negative for discharge, dysuria and genital sores.  Neurological: Negative for dizziness and light-headedness.      Objective:   Physical Exam  Constitutional: He is oriented to person, place, and time. He appears well-developed and well-nourished.  HENT:  Mouth/Throat: No oropharyngeal exudate.  Eyes: Pupils are equal, round, and reactive to light.  Cardiovascular: Normal rate, regular rhythm and normal heart sounds.   No murmur heard. Pulmonary/Chest: Effort normal and breath sounds normal. No respiratory distress. He has no wheezes.  Abdominal: Soft. Bowel sounds are normal. There is no tenderness.  Lymphadenopathy:    He has no cervical adenopathy.  Neurological: He is alert and oriented to person, place, and time.  Psychiatric: He has a normal mood and affect. His behavior is normal. Judgment and thought content normal.      Assessment & Plan:   HIV (human immunodeficiency virus infection) ([place]) Patient  remains in research study. He reports compliance to Stribild and has not missed a dose in the past month. Tolerating medications well without side effects. Will continue Stribild and switch to Genvoya at next visit. Offered/refused condoms. RTC in 5 months.  Tobacco use disorder Patient reports an attempt to quit smoking, but was unsuccessful. Educated patient on health benefits of smoking cessation (risk reduction for stroke, heart disease, and cancer). Will continue to monitor.  Hypertension BP well controlled on current regimen. Checks BP at home/work "few times a year". Provided patient referral to Primary Care in [facility].  Will continue to monitor.

## 2015-09-13 ENCOUNTER — Encounter: Payer: Self-pay | Admitting: Infectious Diseases

## 2015-09-13 ENCOUNTER — Telehealth: Payer: Self-pay | Admitting: *Deleted

## 2015-09-13 NOTE — Telephone Encounter (Signed)
Patient asking for refills of viagra - rx discontinued by another provider 5 months ago. Please advise if this is ok to refill. Landis Gandy, RN

## 2015-09-19 NOTE — Telephone Encounter (Signed)
Ok to refill 25mg  po x1  No more frequently than every 48h Call MD if erection lasts more than 6 hours or go to ED.

## 2015-09-22 ENCOUNTER — Other Ambulatory Visit: Payer: Self-pay | Admitting: *Deleted

## 2015-09-22 DIAGNOSIS — N521 Erectile dysfunction due to diseases classified elsewhere: Secondary | ICD-10-CM

## 2015-09-22 MED ORDER — SILDENAFIL CITRATE 25 MG PO TABS: 25.0000 mg | ORAL_TABLET | ORAL | 1 refills | Status: DC | PRN

## 2015-09-22 NOTE — Telephone Encounter (Signed)
Done. Thanks.

## 2015-11-24 ENCOUNTER — Encounter: Payer: PRIVATE HEALTH INSURANCE | Admitting: *Deleted

## 2015-11-24 VITALS — BP 137/78 | HR 87 | Temp 98.3°F | Wt 191.8 lb

## 2015-11-24 DIAGNOSIS — Z006 Encounter for examination for normal comparison and control in clinical research program: Secondary | ICD-10-CM

## 2015-11-24 LAB — COMPREHENSIVE METABOLIC PANEL
ALT: 153 U/L — ABNORMAL HIGH (ref 9–46)
AST: 82 U/L — ABNORMAL HIGH (ref 10–35)
Albumin: 5.1 g/dL (ref 3.6–5.1)
Alkaline Phosphatase: 61 U/L (ref 40–115)
BUN: 13 mg/dL (ref 7–25)
CO2: 24 mmol/L (ref 20–31)
Calcium: 9.5 mg/dL (ref 8.6–10.3)
Chloride: 101 mmol/L (ref 98–110)
Creat: 0.97 mg/dL (ref 0.70–1.33)
Glucose, Bld: 105 mg/dL — ABNORMAL HIGH (ref 65–99)
Potassium: 4.3 mmol/L (ref 3.5–5.3)
Sodium: 135 mmol/L (ref 135–146)
Total Bilirubin: 1 mg/dL (ref 0.2–1.2)
Total Protein: 8 g/dL (ref 6.1–8.1)

## 2015-11-24 LAB — LIPID PANEL
Cholesterol: 170 mg/dL (ref <200)
HDL: 26 mg/dL — ABNORMAL LOW (ref >40)
LDL Cholesterol: 109 mg/dL — ABNORMAL HIGH (ref <100)
Total CHOL/HDL Ratio: 6.5 Ratio — ABNORMAL HIGH (ref <5.0)
Triglycerides: 176 mg/dL — ABNORMAL HIGH (ref <150)
VLDL: 35 mg/dL — ABNORMAL HIGH (ref <30)

## 2015-11-24 NOTE — Progress Notes (Signed)
Jacob Ali is here for his final visit for START. He has approximately 2 months worth of Stribild left from study. When that is through he plans to switch to [facility] per Dr. Johnnye Sima. I did schedule him an appt. to see him in January.He says he has been having muscle cramping and aching in his lt hip and calf that only hurts when he exercises or walks. It gets progressively worse as he walks and will go away within minutes of stopping. We discussed intermittent claudication and he said that it sounded a lot like that. He plans to get a primary care MD appt. in January and get a thorough physical and he will discuss that with her. He denies any pain or discomfort at rest and has not noticed any discoloration in his extremities. I did advise him to get help immediately if the pain becomes extreme, the limb becomes discolored or he develops SOB with activity. I also advised him to start a baby aspirin until he can be seen by a physician. He is still struggling to stop smoking and understands his risks for heart disease.

## 2015-11-25 LAB — URINALYSIS, ROUTINE W REFLEX MICROSCOPIC
Bilirubin Urine: NEGATIVE
Glucose, UA: NEGATIVE
Hgb urine dipstick: NEGATIVE
Ketones, ur: NEGATIVE
Leukocytes, UA: NEGATIVE
Nitrite: NEGATIVE
Protein, ur: NEGATIVE
Specific Gravity, Urine: 1.014 (ref 1.001–1.035)
pH: 6 (ref 5.0–8.0)

## 2015-11-27 ENCOUNTER — Other Ambulatory Visit: Payer: Self-pay | Admitting: *Deleted

## 2015-11-27 LAB — CD4/CD8 (T-HELPER/T-SUPPRESSOR CELL)
CD4 absolute: 1130
CD4%: 35.3
CD8 % Suppressor T Cell: 50.2
CD8 T Cell Abs: 1606

## 2015-11-27 MED ORDER — ASPIRIN 81 MG PO CHEW: 81.0000 mg | CHEWABLE_TABLET | Freq: Every day | ORAL | 6 refills | Status: DC

## 2015-11-29 ENCOUNTER — Encounter: Payer: Self-pay | Admitting: *Deleted

## 2015-12-01 LAB — HIV-1 RNA QUANT-NO REFLEX-BLD
HIV 1 RNA Quant: 26 copies/mL — ABNORMAL HIGH (ref <20)
HIV-1 RNA Quant, Log: 1.41 Log copies/mL — ABNORMAL HIGH (ref <1.30)

## 2015-12-14 ENCOUNTER — Encounter: Payer: Self-pay | Admitting: Infectious Diseases

## 2015-12-14 LAB — CD4/CD8 (T-HELPER/T-SUPPRESSOR CELL)
CD4%: 35.3
CD4: 1130
CD8 % Suppressor T Cell: 50.2
CD8: 1606

## 2016-01-17 ENCOUNTER — Encounter: Payer: Self-pay | Admitting: Primary Care

## 2016-01-17 ENCOUNTER — Ambulatory Visit (INDEPENDENT_AMBULATORY_CARE_PROVIDER_SITE_OTHER): Payer: PRIVATE HEALTH INSURANCE | Admitting: Primary Care

## 2016-01-17 VITALS — BP 130/76 | HR 78 | Temp 97.8°F | Ht 69.0 in | Wt 195.8 lb

## 2016-01-17 DIAGNOSIS — E782 Mixed hyperlipidemia: Secondary | ICD-10-CM

## 2016-01-17 DIAGNOSIS — K219 Gastro-esophageal reflux disease without esophagitis: Secondary | ICD-10-CM | POA: Diagnosis not present

## 2016-01-17 DIAGNOSIS — B20 Human immunodeficiency virus [HIV] disease: Secondary | ICD-10-CM | POA: Diagnosis not present

## 2016-01-17 DIAGNOSIS — M1991 Primary osteoarthritis, unspecified site: Secondary | ICD-10-CM

## 2016-01-17 DIAGNOSIS — T1491XA Suicide attempt, initial encounter: Secondary | ICD-10-CM

## 2016-01-17 DIAGNOSIS — I10 Essential (primary) hypertension: Secondary | ICD-10-CM | POA: Diagnosis not present

## 2016-01-17 DIAGNOSIS — M79605 Pain in left leg: Secondary | ICD-10-CM

## 2016-01-17 DIAGNOSIS — F17200 Nicotine dependence, unspecified, uncomplicated: Secondary | ICD-10-CM

## 2016-01-17 MED ORDER — OMEPRAZOLE 20 MG PO CPDR: 20.0000 mg | DELAYED_RELEASE_CAPSULE | Freq: Every day | ORAL | 3 refills | Status: DC

## 2016-01-17 MED ORDER — LISINOPRIL-HYDROCHLOROTHIAZIDE 20-25 MG PO TABS: 1.0000 | ORAL_TABLET | Freq: Every day | ORAL | 3 refills | Status: DC

## 2016-01-17 MED ORDER — GEMFIBROZIL 600 MG PO TABS: 600.0000 mg | ORAL_TABLET | Freq: Every day | ORAL | 2 refills | Status: DC

## 2016-01-17 MED ORDER — IBUPROFEN 800 MG PO TABS: 800.0000 mg | ORAL_TABLET | Freq: Every day | ORAL | 2 refills | Status: DC | PRN

## 2016-01-17 NOTE — Assessment & Plan Note (Signed)
Chronic PPI use, must have or will experience symptoms. Continue use. Discussed trigger foods.

## 2016-01-17 NOTE — Assessment & Plan Note (Signed)
Current smoker, not ready to quit.

## 2016-01-17 NOTE — Patient Instructions (Signed)
I provided you with a refill of all of your medications.   Stop by the front desk and speak with either [facility] regarding your vascular test. I will be in touch with you once I receive these results.  It was a pleasure to meet you today! Please don't hesitate to call me with any questions. Welcome to [facility]!

## 2016-01-17 NOTE — Assessment & Plan Note (Signed)
Stable. Follows with ID.

## 2016-01-17 NOTE — Assessment & Plan Note (Signed)
History of anxiety and depression in 2012-2013. Denies SI/HI today, no concerns for depression/anxiety.

## 2016-01-17 NOTE — Progress Notes (Signed)
Pre visit review using our clinic review tool, if applicable. No additional management support is needed unless otherwise documented below in the visit note. 

## 2016-01-17 NOTE — Progress Notes (Signed)
Subjective:    Patient ID: Jacob Ali, male    DOB: 1961-01-05, 56 y.o.   MRN: [id number]  HPI  Jacob Ali is a 56 year old male who presents today to establish care and discuss the problems mentioned below. Will obtain old records.  1) Hyperlipidemia: Diagnosed years ago. Currently managed on gemfibrozil 600 mg for which he's taken for the past 10 years. His last lipid panel was completed by ID in November 2017, stable and improved.  2) Essential Hypertension: Currently managed on lisinopril-HCTZ 20/25 mg. His blood pressure in the office today is 130/76. He denies chest pain, dizziness, visual changes.  3) HIV: Diagnosed in 2012. Currently managed on Stribild and follows with Dr. Johnnye Sima with [facility]. Currently his CD4 count is undetectable. He follows semi-annually.  4) GERD: Diagnosed years ago. Currently managed on omeprazole 20 mg. He will experience symptoms of esophageal burning, esophageal pressure without his medications.   5) Erectile Dysfunction: Currently managed on Viagra 25 mg. He doesn't feel as though it's effective at times.  6) Anxiety and Depression: Diagnosed originally in 2013 after he was diagnosed with HIV in 2012. He has a history of suicide attempt at that time and has since fully recovered. He was previously managed on Paxil and experienced suicidal thoughts. He does very well without medications and denies SI/HI.  7) Joint Pain/Osteoarthritis: Located to fingers. He takes glucosamine and chondroitin and Ibuprofen 800 mg once daily with improvement and management of discomfort. He denies history of decreased renal function.  8) Lower Extremity Pain: Will experience pain to his left hip with cramping to the left calf and numbness to his left foot. This will become painful with walking short and long distances and will improve nearly immediately with rest. He describes his pain as burning. He has a long history of bilateral lower back pain,  but the symptoms of calf pain and foot numbness began 2 months ago. He denies swelling to his calf, erythema to his calf, long travel. He is a current smoker of cigarettes since age 84.   Review of Systems  Constitutional: Negative for fatigue and fever.  Eyes: Negative for visual disturbance.  Respiratory: Negative for shortness of breath.   Cardiovascular: Negative for chest pain.  Gastrointestinal:       Esophageal reflux  Genitourinary:       Erectile dysfunction  Musculoskeletal: Positive for arthralgias and myalgias. Negative for joint swelling.  Skin: Negative for color change.  Neurological: Negative for dizziness.  Hematological: Negative for adenopathy.  Psychiatric/Behavioral:       Denies concerns for anxiety or depression       Past Medical History:  Diagnosis Date  . Depression   . HIV infection ([place])   . Hyperlipidemia   . Hypertension   . PONV (postoperative nausea and vomiting)      Social History   Social History  . Marital status: Single    Spouse name: N/A  . Number of children: N/A  . Years of education: 55   Occupational History  . [place] History Main Topics  . Smoking status: Current Every Day Smoker    Packs/day: 0.50    Years: 40.00    Types: Cigars  . Smokeless tobacco: Never Used     Comment: 1/2PPD  . Alcohol use 1.8 oz/week    3 Standard drinks or equivalent per week     Comment: 2-3 times a week  .  Drug use:     Frequency: 2.0 times per week    Types: Marijuana  . Sexual activity: Not Currently     Comment: pt. declined condoms   Other Topics Concern  . Not on file   Social History Narrative   Single, lives alone.      Past Surgical History:  Procedure Laterality Date  . Examination under anesthesia, repair of anal fissure,  12/04/2000    Family History  Problem Relation Age of Onset  . Throat cancer Father   . Heart disease Father   . Cancer Father     bladder  . Cancer Maternal  Grandmother     colon    Allergies  Allergen Reactions  . Tamiflu [Oseltamivir Phosphate] Other (See Comments)    Depressive symptoms    Current Outpatient Prescriptions on File Prior to Visit  Medication Sig Dispense Refill  . acetaminophen (TYLENOL) 500 MG tablet Take 1 tablet (500 mg total) by mouth every 6 (six) hours as needed for mild pain (or Fever >/= 101). 20 tablet 0  . albuterol (PROVENTIL HFA) 108 (90 Base) MCG/ACT inhaler Inhale 1-2 puffs into the lungs every 6 (six) hours as needed for wheezing or shortness of breath. 18 g 0  . aspirin (ASPIRIN CHILDRENS) 81 MG chewable tablet Chew 1 tablet (81 mg total) by mouth daily. Take with food 90 tablet 6  . elvitegravir-cobicistat-emtricitabine-tenofovir (STRIBILD) 150-150-200-300 MG TABS tablet Take 1 tablet by mouth daily with breakfast. 90 tablet 6  . sildenafil (VIAGRA) 25 MG tablet Take 1 tablet (25 mg total) by mouth as needed for erectile dysfunction. No more frequently than every 48h Call MD if erection lasts more than 6 hours or go to ED. 10 tablet 1   No current facility-administered medications on file prior to visit.     BP 130/76   Pulse 78   Temp 97.8 F (36.6 C) (Oral)   Ht 5\' 9"  (1.753 m)   Wt 195 lb 12.8 oz (88.8 kg)   SpO2 90%   BMI 28.91 kg/m    Objective:   Physical Exam  Constitutional: He is oriented to person, place, and time. He appears well-nourished.  Neck: Neck supple.  Cardiovascular: Normal rate and regular rhythm.   Pulses:      Dorsalis pedis pulses are 2+ on the right side, and 1+ on the left side.       Posterior tibial pulses are 2+ on the right side, and 1+ on the left side.  Pulmonary/Chest: Effort normal and breath sounds normal. He has no wheezes. He has no rales.  Musculoskeletal: Normal range of motion.       Left hip: He exhibits normal range of motion, normal strength and no tenderness.       Lumbar back: He exhibits normal range of motion, no tenderness and no pain.    Neurological: He is alert and oriented to person, place, and time.  Skin: Skin is warm and dry.  Psychiatric: He has a normal mood and affect.          Assessment & Plan:

## 2016-01-17 NOTE — Assessment & Plan Note (Signed)
Uses Viagra infrequently, sometimes doesn't work well. Will continue to monitor.

## 2016-01-17 NOTE — Assessment & Plan Note (Signed)
Stable on Ibuprofen 800 mg and glucosamine with chondroitin. Renal function in November stable.

## 2016-01-17 NOTE — Assessment & Plan Note (Signed)
Stable today and on prior readings, continue lisinopril-HCTZ. BMP UTD.

## 2016-01-17 NOTE — Assessment & Plan Note (Signed)
Stable on Gemfibrozil. Recent lipid panel improved when compared to lipids in April 2017. Discussed weight loss through healthy diet and exercise. Will continue to monitor.

## 2016-01-18 ENCOUNTER — Other Ambulatory Visit: Payer: Self-pay | Admitting: Primary Care

## 2016-01-18 DIAGNOSIS — I739 Peripheral vascular disease, unspecified: Secondary | ICD-10-CM

## 2016-01-19 ENCOUNTER — Ambulatory Visit: Payer: PRIVATE HEALTH INSURANCE

## 2016-01-19 DIAGNOSIS — M79605 Pain in left leg: Secondary | ICD-10-CM

## 2016-01-19 DIAGNOSIS — I739 Peripheral vascular disease, unspecified: Secondary | ICD-10-CM

## 2016-01-22 ENCOUNTER — Other Ambulatory Visit: Payer: Self-pay | Admitting: Primary Care

## 2016-01-22 DIAGNOSIS — I70202 Unspecified atherosclerosis of native arteries of extremities, left leg: Secondary | ICD-10-CM

## 2016-01-29 ENCOUNTER — Ambulatory Visit (INDEPENDENT_AMBULATORY_CARE_PROVIDER_SITE_OTHER): Payer: PRIVATE HEALTH INSURANCE | Admitting: Vascular Surgery

## 2016-01-29 ENCOUNTER — Encounter (INDEPENDENT_AMBULATORY_CARE_PROVIDER_SITE_OTHER): Payer: Self-pay | Admitting: Vascular Surgery

## 2016-01-29 ENCOUNTER — Encounter (INDEPENDENT_AMBULATORY_CARE_PROVIDER_SITE_OTHER): Payer: Self-pay

## 2016-01-29 ENCOUNTER — Ambulatory Visit: Payer: Self-pay | Admitting: Infectious Diseases

## 2016-01-29 VITALS — BP 138/75 | HR 103 | Resp 17 | Ht 69.0 in | Wt 195.0 lb

## 2016-01-29 DIAGNOSIS — F17200 Nicotine dependence, unspecified, uncomplicated: Secondary | ICD-10-CM | POA: Diagnosis not present

## 2016-01-29 DIAGNOSIS — Z21 Asymptomatic human immunodeficiency virus [HIV] infection status: Secondary | ICD-10-CM

## 2016-01-29 DIAGNOSIS — B20 Human immunodeficiency virus [HIV] disease: Secondary | ICD-10-CM

## 2016-01-29 DIAGNOSIS — E782 Mixed hyperlipidemia: Secondary | ICD-10-CM | POA: Diagnosis not present

## 2016-01-29 DIAGNOSIS — I70219 Atherosclerosis of native arteries of extremities with intermittent claudication, unspecified extremity: Secondary | ICD-10-CM | POA: Insufficient documentation

## 2016-01-29 DIAGNOSIS — I10 Essential (primary) hypertension: Secondary | ICD-10-CM | POA: Diagnosis not present

## 2016-01-29 NOTE — Progress Notes (Signed)
MRN : [id number]  Jacob Ali is a 56 y.o. ([birth year]) male who presents with chief complaint of  Chief Complaint  Patient presents with  . New Patient (Initial Visit)  .  History of Present Illness:  The patient is seen for evaluation of painful lower extremities and diminished pulses. Patient notes the pain is always associated with activity and is very consistent day today. Typically, the pain occurs at less than one block, progress is as activity continues to the point that the patient must stop walking. Resting including standing still for several minutes allowed resumption of the activity and the ability to walk a similar distance before stopping again. Uneven terrain and inclined shorten the distance. The pain has been progressive over the past several years. The patient states the inability to walk is now having a profound negative impact on quality of life and daily activities.  The patient denies rest pain or dangling of an extremity off the side of the bed during the night for relief. No open wounds or sores at this time. No prior interventions or surgeries.  No history of back problems or DJD of the lumbar sacral spine.   The patient denies changes in claudication symptoms or new rest pain symptoms.  No new ulcers or wounds of the foot.  The patient's blood pressure has been stable and relatively well controlled. The patient denies amaurosis fugax or recent TIA symptoms. There are no recent neurological changes noted. The patient denies history of DVT, PE or superficial thrombophlebitis. The patient denies recent episodes of angina or shortness of breath.    Current Meds  Medication Sig  . acetaminophen (TYLENOL) 500 MG tablet Take 1 tablet (500 mg total) by mouth every 6 (six) hours as needed for mild pain (or Fever >/= 101).  Marland Kitchen albuterol (PROVENTIL HFA) 108 (90 Base) MCG/ACT inhaler Inhale 1-2 puffs into the lungs every 6 (six) hours as needed for wheezing or  shortness of breath.  Marland Kitchen aspirin (ASPIRIN CHILDRENS) 81 MG chewable tablet Chew 1 tablet (81 mg total) by mouth daily. Take with food  . elvitegravir-cobicistat-emtricitabine-tenofovir (STRIBILD) 150-150-200-300 MG TABS tablet Take 1 tablet by mouth daily with breakfast.  . gemfibrozil (LOPID) 600 MG tablet Take 1 tablet (600 mg total) by mouth daily.  Marland Kitchen ibuprofen (ADVIL,MOTRIN) 800 MG tablet Take 1 tablet (800 mg total) by mouth daily as needed for moderate pain.  Marland Kitchen lisinopril-hydrochlorothiazide (PRINZIDE,ZESTORETIC) 20-25 MG tablet Take 1 tablet by mouth daily.  . Misc Natural Products (GLUCOSAMINE CHOND MSM FORMULA PO) Take 1 tablet by mouth daily.  Marland Kitchen omeprazole (PRILOSEC) 20 MG capsule Take 1 capsule (20 mg total) by mouth daily.  . sildenafil (VIAGRA) 25 MG tablet Take 1 tablet (25 mg total) by mouth as needed for erectile dysfunction. No more frequently than every 48h Call MD if erection lasts more than 6 hours or go to ED.    Past Medical History:  Diagnosis Date  . Depression   . HIV infection ([place])   . Hyperlipidemia   . Hypertension   . PONV (postoperative nausea and vomiting)     Past Surgical History:  Procedure Laterality Date  . Examination under anesthesia, repair of anal fissure,  12/04/2000    Social History Social History  Substance Use Topics  . Smoking status: Current Every Day Smoker    Packs/day: 0.50    Years: 40.00    Types: Cigars  . Smokeless tobacco: Never Used     Comment: 1/2PPD  .  Alcohol use 1.8 oz/week    3 Standard drinks or equivalent per week     Comment: 2-3 times a week    Family History Family History  Problem Relation Age of Onset  . Throat cancer Father   . Heart disease Father   . Cancer Father     bladder  . Cancer Maternal Grandmother     colon  No family history of bleeding/clotting disorders, porphyria or autoimmune disease   Allergies  Allergen Reactions  . Tamiflu [Oseltamivir Phosphate] Other (See Comments)     Depressive symptoms     REVIEW OF SYSTEMS (Negative unless checked)  Constitutional: [] Weight loss  [] Fever  [] Chills Cardiac: [] Chest pain   [] Chest pressure   [] Palpitations   [] Shortness of breath when laying flat   [] Shortness of breath with exertion. Vascular:  [x] Pain in legs with walking   [] Pain in legs at rest  [] History of DVT   [] Phlebitis   [x] Swelling in legs   [] Varicose veins   [] Non-healing ulcers Pulmonary:   [] Uses home oxygen   [] Productive cough   [] Hemoptysis   [] Wheeze  [] COPD   [] Asthma Neurologic:  [] Dizziness   [] Seizures   [] History of stroke   [] History of TIA  [] Aphasia   [] Vissual changes   [] Weakness or numbness in arm   [] Weakness or numbness in leg Musculoskeletal:   [] Joint swelling   [] Joint pain   [] Low back pain Hematologic:  [] Easy bruising  [] Easy bleeding   [] Hypercoagulable state   [] Anemic Gastrointestinal:  [] Diarrhea   [] Vomiting  [] Gastroesophageal reflux/heartburn   [] Difficulty swallowing. Genitourinary:  [] Chronic kidney disease   [] Difficult urination  [] Frequent urination   [] Blood in urine Skin:  [] Rashes   [] Ulcers  Psychological:  [] History of anxiety   []  History of major depression.  Physical Examination  Vitals:   01/29/16 1026  BP: 138/75  Pulse: (!) 103  Resp: 17  Weight: 195 lb (88.5 kg)  Height: 5\' 9"  (1.753 m)   Body mass index is 28.8 kg/m. Gen: WD/WN, NAD Head: [place]/AT, No temporalis wasting.  Ear/Nose/Throat: Hearing grossly intact, nares w/o erythema or drainage, poor dentition Eyes: PER, EOMI, sclera nonicteric.  Neck: Supple, no masses.  No bruit or JVD.  Pulmonary:  Good air movement, clear to auscultation bilaterally, no use of accessory muscles.  Cardiac: RRR, normal S1, S2, no Murmurs. Vascular:  Both feet are warm with 2 second capillary refill.  No significant trophic changes Vessel Right Left  Radial Palpable Palpable  Ulnar Palpable Palpable  Brachial Palpable Palpable  Carotid Palpable Palpable    Femoral Palpable Trace Palpable  Popliteal Palpable Not Palpable  PT Palpable Not Palpable  DP Palpable Not Palpable   Gastrointestinal: soft, non-distended. No guarding/no peritoneal signs.  Musculoskeletal: M/S 5/5 throughout.  No deformity or atrophy.  Neurologic: CN 2-12 intact. Pain and light touch intact in extremities.  Symmetrical.  Speech is fluent. Motor exam as listed above. Psychiatric: Judgment intact, Mood & affect appropriate for pt's clinical situation. Dermatologic: No rashes or ulcers noted.  No changes consistent with cellulitis. Lymph : No Cervical lymphadenopathy, no lichenification or skin changes of chronic lymphedema.  CBC Lab Results  Component Value Date   WBC 7.5 06/05/2015   HGB 13.7 04/07/2015   HCT 39.0 06/05/2015   MCV 101 (H) 06/05/2015   PLT 246 06/05/2015    BMET    Component Value Date/Time   NA 135 11/24/2015 0910   NA 139 06/05/2015 0940   K  4.3 11/24/2015 0910   CL 101 11/24/2015 0910   CO2 24 11/24/2015 0910   GLUCOSE 105 (H) 11/24/2015 0910   BUN 13 11/24/2015 0910   BUN 15 06/05/2015 0940   CREATININE 0.97 11/24/2015 0910   CALCIUM 9.5 11/24/2015 0910   GFRNONAA 96 06/05/2015 0940   GFRNONAA >60 10/16/2010 1641   GFRAA 111 06/05/2015 0940   GFRAA >60 10/16/2010 1641   CrCl cannot be calculated (Patient's most recent lab result is older than the maximum 21 days allowed.).  COAG Lab Results  Component Value Date   INR 1.20 02/23/2011   INR 1.27 02/23/2011   INR 1.22 02/22/2011    Radiology No results found.  Assessment/Plan 1. Atherosclerosis with claudication of extremity ([place]) Recommend:  The patient has experienced increased symptoms and is now describing lifestyle limiting claudication and mild rest pain.   Given the severity of the patient's lower extremity symptoms the patient should undergo angiography and intervention.  Risk and benefits were reviewed the patient.  Indications for the procedure were reviewed.   All questions were answered, the patient agrees to proceed.   The patient should continue walking and begin a more formal exercise program.  The patient should continue antiplatelet therapy and aggressive treatment of the lipid abnormalities  The patient will follow up with me after the angiogram.   2. Essential hypertension Continue antihypertensive medications as already ordered, these medications have been reviewed and there are no changes at this time.  3. Mixed hyperlipidemia Continue statin as ordered and reviewed, no changes at this time  4. HIV (human immunodeficiency virus infection) ([place]) Continue antiviral medications  5. Tobacco use disorder Cessation stressed and the marked decreased in patency of stents was reviewed.  Patient voiced understanding    Hortencia Pilar, MD  01/29/2016 12:20 PM

## 2016-01-30 ENCOUNTER — Ambulatory Visit: Payer: Self-pay | Admitting: Infectious Diseases

## 2016-02-05 ENCOUNTER — Other Ambulatory Visit (INDEPENDENT_AMBULATORY_CARE_PROVIDER_SITE_OTHER): Payer: Self-pay | Admitting: Vascular Surgery

## 2016-02-05 MED ORDER — CEFAZOLIN IN D5W 1 GM/50ML IV SOLN
1.0000 g | Freq: Once | INTRAVENOUS | Status: AC
  Administered 2016-02-06: 1 g via INTRAVENOUS

## 2016-02-06 ENCOUNTER — Encounter: Payer: Self-pay | Admitting: *Deleted

## 2016-02-06 ENCOUNTER — Ambulatory Visit
Admission: RE | Admit: 2016-02-06 | Discharge: 2016-02-06 | Disposition: A | Discharge status: Home / Self Care | Payer: No Typology Code available for payment source | Source: Ambulatory Visit | Attending: Vascular Surgery | Admitting: Vascular Surgery

## 2016-02-06 ENCOUNTER — Encounter
Admission: RE | Disposition: A | Discharge status: Home / Self Care | Payer: Self-pay | Source: Ambulatory Visit | Attending: Vascular Surgery

## 2016-02-06 DIAGNOSIS — Z8052 Family history of malignant neoplasm of bladder: Secondary | ICD-10-CM | POA: Diagnosis not present

## 2016-02-06 DIAGNOSIS — Z800 Family history of malignant neoplasm of digestive organs: Secondary | ICD-10-CM | POA: Diagnosis not present

## 2016-02-06 DIAGNOSIS — I70213 Atherosclerosis of native arteries of extremities with intermittent claudication, bilateral legs: Secondary | ICD-10-CM | POA: Insufficient documentation

## 2016-02-06 DIAGNOSIS — Z8249 Family history of ischemic heart disease and other diseases of the circulatory system: Secondary | ICD-10-CM | POA: Diagnosis not present

## 2016-02-06 DIAGNOSIS — F17210 Nicotine dependence, cigarettes, uncomplicated: Secondary | ICD-10-CM | POA: Diagnosis not present

## 2016-02-06 DIAGNOSIS — B20 Human immunodeficiency virus [HIV] disease: Secondary | ICD-10-CM | POA: Insufficient documentation

## 2016-02-06 DIAGNOSIS — E785 Hyperlipidemia, unspecified: Secondary | ICD-10-CM | POA: Insufficient documentation

## 2016-02-06 DIAGNOSIS — I10 Essential (primary) hypertension: Secondary | ICD-10-CM | POA: Insufficient documentation

## 2016-02-06 DIAGNOSIS — Z887 Allergy status to serum and vaccine status: Secondary | ICD-10-CM | POA: Diagnosis not present

## 2016-02-06 HISTORY — PX: PERIPHERAL VASCULAR CATHETERIZATION: SHX172C

## 2016-02-06 HISTORY — DX: Peripheral vascular disease, unspecified: I73.9

## 2016-02-06 HISTORY — DX: Sleep apnea, unspecified: G47.30

## 2016-02-06 HISTORY — DX: Gastro-esophageal reflux disease without esophagitis: K21.9

## 2016-02-06 LAB — CREATININE, SERUM
Creatinine, Ser: 1 mg/dL (ref 0.61–1.24)
GFR calc Af Amer: >60 mL/min (ref >60)
GFR calc non Af Amer: >60 mL/min (ref >60)

## 2016-02-06 LAB — BUN: BUN: 14 mg/dL (ref 6–20)

## 2016-02-06 SURGERY — LOWER EXTREMITY ANGIOGRAPHY
Anesthesia: Moderate Sedation | Site: Leg Lower

## 2016-02-06 MED ORDER — ACETAMINOPHEN 325 MG RE SUPP
325.0000 mg | RECTAL | Status: DC | PRN
  Filled 2016-02-06: qty 2

## 2016-02-06 MED ORDER — PANTOPRAZOLE SODIUM 40 MG PO TBEC: 40.0000 mg | DELAYED_RELEASE_TABLET | Freq: Every day | ORAL | Status: DC

## 2016-02-06 MED ORDER — OXYCODONE HCL 5 MG PO TABS: 5.0000 mg | ORAL_TABLET | ORAL | Status: DC | PRN

## 2016-02-06 MED ORDER — CLOPIDOGREL BISULFATE 300 MG PO TABS
300.0000 mg | ORAL_TABLET | ORAL | Status: AC
  Administered 2016-02-06: 300 mg via ORAL

## 2016-02-06 MED ORDER — ONDANSETRON HCL 4 MG/2ML IJ SOLN: 4.0000 mg | Freq: Four times a day (QID) | INTRAMUSCULAR | Status: DC | PRN

## 2016-02-06 MED ORDER — CLOPIDOGREL BISULFATE 75 MG PO TABS
ORAL_TABLET | ORAL | Status: AC
  Administered 2016-02-06: 300 mg via ORAL
  Filled 2016-02-06: qty 4

## 2016-02-06 MED ORDER — CLOPIDOGREL BISULFATE 75 MG PO TABS: 75.0000 mg | ORAL_TABLET | Freq: Every day | ORAL | 4 refills | Status: DC

## 2016-02-06 MED ORDER — HEPARIN (PORCINE) IN NACL 2-0.9 UNIT/ML-% IJ SOLN
INTRAMUSCULAR | Status: AC
  Filled 2016-02-06: qty 1000

## 2016-02-06 MED ORDER — FENTANYL CITRATE (PF) 100 MCG/2ML IJ SOLN
INTRAMUSCULAR | Status: AC
  Filled 2016-02-06: qty 2

## 2016-02-06 MED ORDER — METHYLPREDNISOLONE SODIUM SUCC 125 MG IJ SOLR: 125.0000 mg | INTRAMUSCULAR | Status: DC | PRN

## 2016-02-06 MED ORDER — HYDROMORPHONE HCL 1 MG/ML IJ SOLN: 1.0000 mg | Freq: Once | INTRAMUSCULAR | Status: DC

## 2016-02-06 MED ORDER — HYDRALAZINE HCL 20 MG/ML IJ SOLN: 5.0000 mg | INTRAMUSCULAR | Status: DC | PRN

## 2016-02-06 MED ORDER — PANTOPRAZOLE SODIUM 40 MG PO TBEC: 40.0000 mg | DELAYED_RELEASE_TABLET | Freq: Every day | ORAL | 11 refills | Status: DC

## 2016-02-06 MED ORDER — HYDROMORPHONE HCL 1 MG/ML IJ SOLN
0.5000 mg | INTRAMUSCULAR | Status: DC | PRN
Start: 2016-02-06 — End: 2016-02-06

## 2016-02-06 MED ORDER — CLOPIDOGREL BISULFATE 75 MG PO TABS: 75.0000 mg | ORAL_TABLET | Freq: Every day | ORAL | Status: DC

## 2016-02-06 MED ORDER — FENTANYL CITRATE (PF) 100 MCG/2ML IJ SOLN
INTRAMUSCULAR | Status: DC | PRN
  Administered 2016-02-06: 25 ug via INTRAVENOUS
  Administered 2016-02-06: 50 ug via INTRAVENOUS
  Administered 2016-02-06: 25 ug via INTRAVENOUS
  Administered 2016-02-06: 100 ug via INTRAVENOUS

## 2016-02-06 MED ORDER — MIDAZOLAM HCL 2 MG/2ML IJ SOLN
INTRAMUSCULAR | Status: DC | PRN
  Administered 2016-02-06: 0.5 mg via INTRAVENOUS
  Administered 2016-02-06: 1 mg via INTRAVENOUS
  Administered 2016-02-06: 2 mg via INTRAVENOUS
  Administered 2016-02-06: 0.5 mg via INTRAVENOUS

## 2016-02-06 MED ORDER — METOPROLOL TARTRATE 5 MG/5ML IV SOLN: 2.0000 mg | INTRAVENOUS | Status: DC | PRN

## 2016-02-06 MED ORDER — HEPARIN SODIUM (PORCINE) 1000 UNIT/ML IJ SOLN
INTRAMUSCULAR | Status: AC
Start: 2016-02-06 — End: 2016-02-06
  Filled 2016-02-06: qty 1

## 2016-02-06 MED ORDER — SODIUM CHLORIDE 0.9 % IV SOLN: 500.0000 mL | Freq: Once | INTRAVENOUS | Status: DC | PRN

## 2016-02-06 MED ORDER — DOCUSATE SODIUM 100 MG PO CAPS: 100.0000 mg | ORAL_CAPSULE | Freq: Every day | ORAL | Status: DC

## 2016-02-06 MED ORDER — CEFAZOLIN IN D5W 1 GM/50ML IV SOLN
INTRAVENOUS | Status: AC
  Filled 2016-02-06: qty 50

## 2016-02-06 MED ORDER — MIDAZOLAM HCL 5 MG/5ML IJ SOLN
INTRAMUSCULAR | Status: AC
  Filled 2016-02-06: qty 5

## 2016-02-06 MED ORDER — ALUM & MAG HYDROXIDE-SIMETH 200-200-20 MG/5ML PO SUSP: 15.0000 mL | ORAL | Status: DC | PRN

## 2016-02-06 MED ORDER — PHENOL 1.4 % MT LIQD: 1.0000 | OROMUCOSAL | Status: DC | PRN

## 2016-02-06 MED ORDER — MAGNESIUM SULFATE 2 GM/50ML IV SOLN: 2.0000 g | Freq: Every day | INTRAVENOUS | Status: DC | PRN

## 2016-02-06 MED ORDER — SODIUM CHLORIDE 0.9 % IV SOLN
INTRAVENOUS | Status: DC
  Administered 2016-02-06: 08:00:00 via INTRAVENOUS

## 2016-02-06 MED ORDER — LABETALOL HCL 5 MG/ML IV SOLN: 10.0000 mg | INTRAVENOUS | Status: DC | PRN

## 2016-02-06 MED ORDER — LIDOCAINE HCL (PF) 1 % IJ SOLN
INTRAMUSCULAR | Status: AC
Start: 2016-02-06 — End: 2016-02-06
  Filled 2016-02-06: qty 10

## 2016-02-06 MED ORDER — POTASSIUM CHLORIDE CRYS ER 20 MEQ PO TBCR: 20.0000 meq | EXTENDED_RELEASE_TABLET | Freq: Every day | ORAL | Status: DC | PRN

## 2016-02-06 MED ORDER — HEPARIN SODIUM (PORCINE) 1000 UNIT/ML IJ SOLN
INTRAMUSCULAR | Status: DC | PRN
  Administered 2016-02-06: 5000 [IU] via INTRAVENOUS

## 2016-02-06 MED ORDER — GUAIFENESIN-DM 100-10 MG/5ML PO SYRP: 15.0000 mL | ORAL_SOLUTION | ORAL | Status: DC | PRN

## 2016-02-06 MED ORDER — ACETAMINOPHEN 325 MG PO TABS: 325.0000 mg | ORAL_TABLET | ORAL | Status: DC | PRN

## 2016-02-06 MED ORDER — IOPAMIDOL (ISOVUE-300) INJECTION 61%
INTRAVENOUS | Status: DC | PRN
  Administered 2016-02-06: 105 mL via INTRA_ARTERIAL

## 2016-02-06 MED ORDER — FAMOTIDINE 20 MG PO TABS: 40.0000 mg | ORAL_TABLET | ORAL | Status: DC | PRN

## 2016-02-06 SURGICAL SUPPLY — 25 items
BALLN LUTONIX 4X120X130 (BALLOONS) ×4
BALLN LUTONIX 5X150X130 (BALLOONS) ×4
BALLOON LUTONIX 4X120X130 (BALLOONS) ×3 IMPLANT
BALLOON LUTONIX 5X150X130 (BALLOONS) ×3 IMPLANT
CATH CROSSER 14S OTW 146CM (CATHETERS) ×4 IMPLANT
CATH PIG 70CM (CATHETERS) ×4 IMPLANT
CATH SIDEKICK ANG 110 GEOALIGN (CATHETERS) ×4 IMPLANT
DEVICE PRESTO INFLATION (MISCELLANEOUS) ×4 IMPLANT
DEVICE STARCLOSE SE CLOSURE (Vascular Products) ×4 IMPLANT
DEVICE TORQUE (MISCELLANEOUS) ×4 IMPLANT
GLIDEWIRE ANGLED SS 035X260CM (WIRE) ×4 IMPLANT
KIT FLOWMATE PROCEDURAL (MISCELLANEOUS) ×4 IMPLANT
NEEDLE ENTRY 21GA 7CM ECHOTIP (NEEDLE) ×4 IMPLANT
PACK ANGIOGRAPHY (CUSTOM PROCEDURE TRAY) ×4 IMPLANT
SET INTRO CAPELLA COAXIAL (SET/KITS/TRAYS/PACK) ×4 IMPLANT
SHEATH BRITE TIP 5FRX11 (SHEATH) ×4 IMPLANT
SHEATH PINNACLE MP 7F 45CM (SHEATH) ×4 IMPLANT
STENT LIFESTENT 5F 5X100X135 (Permanent Stent) ×4 IMPLANT
STENT LIFESTREAM 7X26X80 (Permanent Stent) ×4 IMPLANT
SYR MEDRAD MARK V 150ML (SYRINGE) ×4 IMPLANT
TOWEL OR 17X26 4PK STRL BLUE (TOWEL DISPOSABLE) ×4 IMPLANT
TUBING CONTRAST HIGH PRESS 72 (TUBING) ×4 IMPLANT
WIRE HI TORQ VERSACORE 300 (WIRE) ×4 IMPLANT
WIRE J 3MM .035X145CM (WIRE) ×4 IMPLANT
WIRE SPARTACORE .014X300CM (WIRE) ×4 IMPLANT

## 2016-02-06 NOTE — Op Note (Signed)
[facility] Percutaneous Study/Intervention Procedural Note   Date of Surgery: 02/06/2016  Surgeon:  Katha Cabal, MD.  Pre-operative Diagnosis: Atherosclerotic occlusive disease bilateral lower extremities with lifestyle limiting claudication  Post-operative diagnosis: Same  Procedure(s) Performed: 1. Introduction catheter into left lower extremity 3rd order catheter placement  2. Contrast injection left lower extremity for distal runoff   3. Crosser atherectomy of the left SFA and popliteal arteries 4.  Percutaneous transluminal angioplasty and stent placement left superficial femoral artery and popliteal             5.   Percutaneous transluminal angioplasty and stent placement right common iliac artery                         6.   Star close closure right common femoral arteriotomy  Anesthesia: Conscious sedation was administered under my direct supervision. IV Versed plus fentanyl were utilized. Continuous ECG, pulse oximetry and blood pressure was monitored throughout the entire procedure. Conscious sedation was for a total of 1 hour 30 minutes.  Sheath: 7 French Pinnacle sheath right common femoral artery retrograde  Contrast: 105 cc  Fluoroscopy Time: 9.4 minutes  Indications: Jacob Ali presents with increasing bilateral leg pain. The patient is now extremely limited in his ambulation distance. He is describing lifestyle limitation and wishes to undergo angiography with the hope for intervention. Noninvasive studies suggest significant iliac disease as well as left SFA disease. The risks and benefits are reviewed all questions answered patient agrees to proceed.  Procedure: Jacob Ali is a 56 y.o. y.o. male who was identified and appropriate procedural time out was performed. The patient was then placed supine on the table and prepped and draped in the usual sterile fashion.    Ultrasound was placed in the sterile sleeve and the right groin was evaluated the right common femoral artery was echolucent and pulsatile indicating patency.  Image was recorded for the permanent record and under real-time visualization a microneedle was inserted into the common femoral artery microwire followed by a micro-sheath.  A J-wire was then advanced through the micro-sheath and a  5 [place] sheath was then inserted over a J-wire. J-wire was then advanced and a 5 French pigtail catheter was positioned at the level of T12. AP projection of the aorta was then obtained. Pigtail catheter was repositioned to above the bifurcation and a LAO and an RAO view of the pelvis was obtained.  Subsequently a pigtail catheter with the stiff angle Glidewire was used to cross the aortic bifurcation the catheter wire were advanced down into the left distal external iliac artery. Oblique view of the femoral bifurcation was then obtained and subsequently the wire was reintroduced and the pigtail catheter negotiated into the SFA representing third order catheter placement. Distal runoff was then performed.  5000 units of heparin was then given and allowed to circulate and a 7 [place] Pinnacle sheath was advanced up and over the bifurcation and positioned in the femoral artery  The 14S Crosser catheter was then prepped on the field and a angled sidekick catheter was advanced into the cul-de-sac of the SFA under magnified imaging in the LAO projection. Using the Crosser catheter the occlusion was negotiated.  The sidekick catheter was then advanced over the Crosser to a point distal to the occlusion and hand injection of contrast was used to verify intraluminal placement distally.    A 4 x 120 mm Lutonix balloon was  used to angioplasty the superficial femoral and popliteal arteries. Inflation was to 12 atmospheres for 2 minutes. Follow-up imaging demonstrated patency with greater than 50% residual stenosis.  Subsequently,  a 5 x 100 like stent was deployed across the residual stenosis and a 5 x 1 50 mm Lutonix balloon was utilized inflating to 12 atm for 2 full minutes.  Distal runoff was then reassessed and noted to be widely patent and preserved compared with preintervention.  After review of these images the sheath is pulled into the right mid common iliac artery several steep oblique views of the origin of the right common iliac artery were obtained measurements were made and a 7 x 26 Lifestream stent was deployed across the stenosis associated with the origin of the common iliac on the right. Follow-up imaging demonstrated wide patency with less than 5% residual stenosis. The sheath was then repositioned into the right external iliac oblique of the common femoral is obtained and a Star close device deployed. There no immediate Complications.  Findings: The abdominal aorta is opacified with a bolus injection contrast. Renal arteries are patent there is a greater than 75% stenosis of the left renal artery right renal arteries widely patent. The aorta itself has diffuse disease but no hemodynamically significant lesions. The common and external iliac arteries are widely patent on the left but on the right there is a high-grade stenosis greater than 80% noted in the right common iliac proximally this is associated with moderate poststenotic dilatation. The right external iliac arteries widely patent. The internal iliac arteries are noted to be patent bilaterally but diffusely diseased especially in their more distal portions.  The left common femoral is widely patent as is the profunda femoris.  The SFA does indeed have a significant stenosis in its midportion which is occluded.  The distal popliteal demonstrates increasing disease and the trifurcation is diseased with occlusion of the posterior tibial artery. The anterior tibial artery and the peroneal are patent filling the pedal arch via the dorsalis pedis and the  reconstitution of the distal posterior tibial and lateral plantar vessels.   Angioplasty and stent placement of the SFA in its midportion yields an excellent result with less than 10% residual stenosis. Angioplasty and stent placement of the right common iliac yield an excellent result with less than 10% residual stenosis.   Disposition: Patient was taken to the recovery room in stable condition having tolerated the procedure well.  Jacob Ali 02/06/2016,7:41 PM

## 2016-02-06 NOTE — H&P (Signed)
[facility] History & Physical Update  The patient was interviewed and re-examined.  The patient's previous History and Physical has been reviewed and is unchanged.  There is no change in the plan of care. We plan to proceed with the scheduled procedure.  Hortencia Pilar, MD  02/06/2016, 8:15 AM

## 2016-02-07 ENCOUNTER — Encounter: Payer: Self-pay | Admitting: Vascular Surgery

## 2016-03-11 ENCOUNTER — Other Ambulatory Visit (HOSPITAL_COMMUNITY)
Admission: RE | Admit: 2016-03-11 | Discharge: 2016-03-11 | Disposition: A | Discharge status: Home / Self Care | Payer: PRIVATE HEALTH INSURANCE | Source: Ambulatory Visit | Attending: Infectious Diseases | Admitting: Infectious Diseases

## 2016-03-11 ENCOUNTER — Encounter: Payer: Self-pay | Admitting: Infectious Diseases

## 2016-03-11 ENCOUNTER — Ambulatory Visit (INDEPENDENT_AMBULATORY_CARE_PROVIDER_SITE_OTHER): Payer: PRIVATE HEALTH INSURANCE | Admitting: Infectious Diseases

## 2016-03-11 ENCOUNTER — Telehealth: Payer: Self-pay | Admitting: *Deleted

## 2016-03-11 VITALS — BP 139/83 | HR 95 | Temp 98.2°F | Ht 69.0 in | Wt 195.0 lb

## 2016-03-11 DIAGNOSIS — F17200 Nicotine dependence, unspecified, uncomplicated: Secondary | ICD-10-CM

## 2016-03-11 DIAGNOSIS — Z789 Other specified health status: Secondary | ICD-10-CM

## 2016-03-11 DIAGNOSIS — I10 Essential (primary) hypertension: Secondary | ICD-10-CM | POA: Diagnosis not present

## 2016-03-11 DIAGNOSIS — E782 Mixed hyperlipidemia: Secondary | ICD-10-CM

## 2016-03-11 DIAGNOSIS — Z113 Encounter for screening for infections with a predominantly sexual mode of transmission: Secondary | ICD-10-CM | POA: Insufficient documentation

## 2016-03-11 DIAGNOSIS — I70219 Atherosclerosis of native arteries of extremities with intermittent claudication, unspecified extremity: Secondary | ICD-10-CM | POA: Diagnosis not present

## 2016-03-11 DIAGNOSIS — Z716 Tobacco abuse counseling: Secondary | ICD-10-CM

## 2016-03-11 DIAGNOSIS — B20 Human immunodeficiency virus [HIV] disease: Secondary | ICD-10-CM | POA: Diagnosis not present

## 2016-03-11 LAB — COMPREHENSIVE METABOLIC PANEL
ALT: 145 U/L — ABNORMAL HIGH (ref 9–46)
AST: 73 U/L — ABNORMAL HIGH (ref 10–35)
Albumin: 4.3 g/dL (ref 3.6–5.1)
Alkaline Phosphatase: 68 U/L (ref 40–115)
BUN: 14 mg/dL (ref 7–25)
CO2: 25 mmol/L (ref 20–31)
Calcium: 9.4 mg/dL (ref 8.6–10.3)
Chloride: 105 mmol/L (ref 98–110)
Creat: 0.91 mg/dL (ref 0.70–1.33)
Glucose, Bld: 191 mg/dL — ABNORMAL HIGH (ref 65–99)
Potassium: 4.2 mmol/L (ref 3.5–5.3)
Sodium: 138 mmol/L (ref 135–146)
Total Bilirubin: 0.6 mg/dL (ref 0.2–1.2)
Total Protein: 6.6 g/dL (ref 6.1–8.1)

## 2016-03-11 LAB — LIPID PANEL
Cholesterol: 168 mg/dL (ref <200)
HDL: 20 mg/dL — ABNORMAL LOW (ref >40)
LDL Cholesterol: 88 mg/dL (ref <100)
Total CHOL/HDL Ratio: 8.4 Ratio — ABNORMAL HIGH (ref <5.0)
Triglycerides: 299 mg/dL — ABNORMAL HIGH (ref <150)
VLDL: 60 mg/dL — ABNORMAL HIGH (ref <30)

## 2016-03-11 LAB — CBC
HCT: 38.6 % (ref 38.5–50.0)
Hemoglobin: 12.8 g/dL — ABNORMAL LOW (ref 13.2–17.1)
MCH: 33.3 pg — ABNORMAL HIGH (ref 27.0–33.0)
MCHC: 33.2 g/dL (ref 32.0–36.0)
MCV: 100.5 fL — ABNORMAL HIGH (ref 80.0–100.0)
MPV: 10.9 fL (ref 7.5–12.5)
Platelets: 206 10*3/uL (ref 140–400)
RBC: 3.84 MIL/uL — ABNORMAL LOW (ref 4.20–5.80)
RDW: 13.7 % (ref 11.0–15.0)
WBC: 8.1 10*3/uL (ref 3.8–10.8)

## 2016-03-11 MED ORDER — ELVITEG-COBIC-EMTRICIT-TENOFAF 150-150-200-10 MG PO TABS: 1.0000 | ORAL_TABLET | Freq: Every day | ORAL | 3 refills | Status: DC

## 2016-03-11 MED ORDER — NICOTINE POLACRILEX 4 MG MT GUM: 4.0000 mg | CHEWING_GUM | OROMUCOSAL | 1 refills | Status: DC | PRN

## 2016-03-11 MED ORDER — NICOTINE POLACRILEX 2 MG MT GUM: 2.0000 mg | CHEWING_GUM | OROMUCOSAL | Status: DC | PRN

## 2016-03-11 NOTE — Assessment & Plan Note (Signed)
He is doing much better.  Wounds well healed.  Greatly appreciate the f/u of vasc Will work on smoking cessation.

## 2016-03-11 NOTE — Assessment & Plan Note (Signed)
Will check his lipids today.  Appreciate PCP f/u.

## 2016-03-11 NOTE — Assessment & Plan Note (Signed)
Will write him rx for nicotine gum Anti-depressant prev game him nightmares and he does not want another.  He also had side effects from patches (now told to take off at night).

## 2016-03-11 NOTE — Progress Notes (Signed)
   Subjective:    Patient ID: Jacob Ali, male    DOB: Aug 11, 1960, 56 y.o.   MRN: [id number]  HPI 56 y.o. Male diagnosed with HIV September 2012.  Hx of HTN, hyperlipidemia, hospitalization for tylenol overdose and cutting of wrists on 02/20/11.  Patient reports adherence to Gemfibrozil, lisinopril/HCTZ and Stribild. He underwent atherectomy of L SFA, PTCA and stent of L SFA and popliteal arteries; and PTCA and stent of R common iliac.  Wound is healed. No pain in legs. No cramping. Still having some hip pain.   He is feeling well.  He is on his last bottle of stribild, ready to get approved for Genvoya.  Wants smoking cessation aide.    HIV 1 RNA Quant (copies/mL)  Date Value  11/24/2015 26 (H)  05/17/2015 30 (H)  09/30/2014 <20   CD4 (no units)  Date Value  11/24/2015 1,130  05/17/2015 1,550  09/30/2014 952    Review of Systems  Constitutional: Negative for appetite change and unexpected weight change.  Respiratory: Negative for shortness of breath.   Cardiovascular: Negative for chest pain.  Gastrointestinal: Negative for constipation and diarrhea.  Genitourinary: Negative for difficulty urinating.  Musculoskeletal: Negative for myalgias.  Neurological: Negative for headaches.  Please see HPI. 12 point ROS o/w (-)      Objective:   Physical Exam  Constitutional: He appears well-developed and well-nourished.  HENT:  Mouth/Throat: No oropharyngeal exudate.  Eyes: EOM are normal. Pupils are equal, round, and reactive to light.  Neck: Neck supple.  Cardiovascular: Normal rate, regular rhythm and normal heart sounds.   Pulmonary/Chest: Effort normal and breath sounds normal.  Abdominal: Soft. Bowel sounds are normal. There is no tenderness. There is no rebound.  Genitourinary:     Lymphadenopathy:    He has no cervical adenopathy.          Assessment & Plan:

## 2016-03-11 NOTE — Assessment & Plan Note (Addendum)
Will change him to genvoya.  Try to get him co-pay card.  Has gotten flu shot.  Given condoms Will see him back in 6-12 months.

## 2016-03-11 NOTE — Assessment & Plan Note (Signed)
Greatly appreciate f/u with his PCP.

## 2016-03-11 NOTE — Telephone Encounter (Signed)
Sent Nicroette rx to pharmacy

## 2016-03-12 LAB — URINALYSIS, ROUTINE W REFLEX MICROSCOPIC
Bilirubin Urine: NEGATIVE
Hgb urine dipstick: NEGATIVE
Ketones, ur: NEGATIVE
Leukocytes, UA: NEGATIVE
Nitrite: NEGATIVE
Protein, ur: NEGATIVE
Specific Gravity, Urine: 1.02 (ref 1.001–1.035)
pH: 6.5 (ref 5.0–8.0)

## 2016-03-12 LAB — URINALYSIS, MICROSCOPIC ONLY
Bacteria, UA: NONE SEEN [HPF]
Casts: NONE SEEN [LPF]
Crystals: NONE SEEN [HPF]
RBC / HPF: NONE SEEN RBC/HPF (ref <=2)
Squamous Epithelial / LPF: NONE SEEN [HPF] (ref <=5)
WBC, UA: NONE SEEN WBC/HPF (ref <=5)
Yeast: NONE SEEN [HPF]

## 2016-03-12 LAB — ALLERGEN, BOTRYTIS CINEREA, M7: Allergen, Botrytis cinerea,m7: <0.1 kU/L

## 2016-03-12 LAB — URINE CYTOLOGY ANCILLARY ONLY
Chlamydia: NEGATIVE
Neisseria Gonorrhea: NEGATIVE

## 2016-03-12 LAB — RPR

## 2016-03-13 LAB — HIV-1 RNA QUANT-NO REFLEX-BLD
HIV 1 RNA Quant: <20 copies/mL — AB
HIV-1 RNA Quant, Log: <1.3 Log copies/mL — AB

## 2016-03-13 LAB — T-HELPER CELL (CD4) - (RCID CLINIC ONLY)
CD4 % Helper T Cell: 37 % (ref 33–55)
CD4 T Cell Abs: 1030 /uL (ref 400–2700)

## 2016-03-14 ENCOUNTER — Encounter (INDEPENDENT_AMBULATORY_CARE_PROVIDER_SITE_OTHER): Payer: PRIVATE HEALTH INSURANCE

## 2016-03-14 ENCOUNTER — Encounter (INDEPENDENT_AMBULATORY_CARE_PROVIDER_SITE_OTHER): Payer: PRIVATE HEALTH INSURANCE | Admitting: Vascular Surgery

## 2016-04-26 ENCOUNTER — Other Ambulatory Visit (INDEPENDENT_AMBULATORY_CARE_PROVIDER_SITE_OTHER): Payer: Self-pay | Admitting: Vascular Surgery

## 2016-04-26 DIAGNOSIS — I739 Peripheral vascular disease, unspecified: Secondary | ICD-10-CM

## 2016-04-29 ENCOUNTER — Ambulatory Visit (INDEPENDENT_AMBULATORY_CARE_PROVIDER_SITE_OTHER): Payer: PRIVATE HEALTH INSURANCE | Admitting: Vascular Surgery

## 2016-04-29 ENCOUNTER — Ambulatory Visit (INDEPENDENT_AMBULATORY_CARE_PROVIDER_SITE_OTHER): Payer: PRIVATE HEALTH INSURANCE

## 2016-04-29 ENCOUNTER — Encounter (INDEPENDENT_AMBULATORY_CARE_PROVIDER_SITE_OTHER): Payer: Self-pay | Admitting: Vascular Surgery

## 2016-04-29 VITALS — BP 141/83 | HR 72 | Resp 16 | Ht 69.0 in | Wt 184.0 lb

## 2016-04-29 DIAGNOSIS — E782 Mixed hyperlipidemia: Secondary | ICD-10-CM | POA: Diagnosis not present

## 2016-04-29 DIAGNOSIS — I70219 Atherosclerosis of native arteries of extremities with intermittent claudication, unspecified extremity: Secondary | ICD-10-CM

## 2016-04-29 DIAGNOSIS — I739 Peripheral vascular disease, unspecified: Secondary | ICD-10-CM

## 2016-04-29 DIAGNOSIS — I10 Essential (primary) hypertension: Secondary | ICD-10-CM

## 2016-04-29 DIAGNOSIS — K219 Gastro-esophageal reflux disease without esophagitis: Secondary | ICD-10-CM

## 2016-04-29 NOTE — Progress Notes (Signed)
MRN : [id number]  Jacob AGUAYO is a 56 y.o. (1960-03-26) male who presents with chief complaint of  Chief Complaint  Patient presents with  . Routine Post Op    3 week post op  .  History of Present Illness: The patient returns to the office for followup and review status post Left lower extremity angiogram with intervention on 02/06/2016. At that time the patient underwent angioplasty and stent placement of the left superficial femoral artery and the popliteal artery also performed was angioplasty and stent placement of the right common iliac artery.  The patient notes huge improvement in the lower extremity symptoms. No interval shortening of the patient's claudication distance or rest pain symptoms.  No new ulcers or wounds have occurred since the last visit.  There have been no significant changes to the patient's overall health care.  The patient denies amaurosis fugax or recent TIA symptoms. There are no recent neurological changes noted. The patient denies history of DVT, PE or superficial thrombophlebitis. The patient denies recent episodes of angina or shortness of breath.   ABI's Rt=1.02 and Lt=1.03  (previous ABI's Rt=1.00 and Lt=0.52) Duplex US of the left lower extremity arterial system shows SFA is widely patent with uniform velocities through the stented segment and triphasic Doppler signals in the tibials  No outpatient prescriptions have been marked as taking for the 04/29/16 encounter (Office Visit) with Katha Cabal, MD.    Past Medical History:  Diagnosis Date  . Depression   . GERD (gastroesophageal reflux disease)   . HIV infection ([place])   . Hyperlipidemia   . Hypertension   . Peripheral vascular disease ([place])   . PONV (postoperative nausea and vomiting)   . Sleep apnea     Past Surgical History:  Procedure Laterality Date  . Examination under anesthesia, repair of anal fissure,  12/04/2000  . PERIPHERAL VASCULAR CATHETERIZATION Left 02/06/2016   Procedure: Lower Extremity Angiography;  Surgeon: Katha Cabal, MD;  Location: [facility];  Service: Cardiovascular;  Laterality: Left;  . PERIPHERAL VASCULAR CATHETERIZATION N/A 02/06/2016   Procedure: Abdominal Aortogram w/Lower Extremity;  Surgeon: Katha Cabal, MD;  Location: [facility];  Service: Cardiovascular;  Laterality: N/A;  . PERIPHERAL VASCULAR CATHETERIZATION  02/06/2016   Procedure: Lower Extremity Intervention;  Surgeon: Katha Cabal, MD;  Location: [facility];  Service: Cardiovascular;;    Social History Social History  Substance Use Topics  . Smoking status: Current Every Day Smoker    Packs/day: 0.50    Years: 40.00    Types: Cigars  . Smokeless tobacco: Never Used     Comment: 1/2PPD  . Alcohol use 1.8 oz/week    3 Standard drinks or equivalent per week     Comment: 2-3 times a week    Family History Family History  Problem Relation Age of Onset  . Throat cancer Father   . Heart disease Father   . Cancer Father     bladder  . Cancer Maternal Grandmother     colon    Allergies  Allergen Reactions  . Tamiflu [Oseltamivir Phosphate] Other (See Comments)    Depressive symptoms     REVIEW OF SYSTEMS (Negative unless checked)  Constitutional: [] Weight loss  [] Fever  [] Chills Cardiac: [] Chest pain   [] Chest pressure   [] Palpitations   [] Shortness of breath when laying flat   [] Shortness of breath with exertion. Vascular:  [] Pain in legs with walking   [] Pain  in legs at rest  [] History of DVT   [] Phlebitis   [x] Swelling in legs   [] Varicose veins   [] Non-healing ulcers Pulmonary:   [] Uses home oxygen   [] Productive cough   [] Hemoptysis   [] Wheeze  [] COPD   [] Asthma Neurologic:  [] Dizziness   [] Seizures   [] History of stroke   [] History of TIA  [] Aphasia   [] Vissual changes   [] Weakness or numbness in arm   [] Weakness or numbness in leg Musculoskeletal:   [] Joint swelling   [] Joint pain   [] Low back pain Hematologic:   [] Easy bruising  [] Easy bleeding   [] Hypercoagulable state   [] Anemic Gastrointestinal:  [] Diarrhea   [] Vomiting  [] Gastroesophageal reflux/heartburn   [] Difficulty swallowing. Genitourinary:  [] Chronic kidney disease   [] Difficult urination  [] Frequent urination   [] Blood in urine Skin:  [] Rashes   [] Ulcers  Psychological:  [] History of anxiety   []  History of major depression.  Physical Examination  Vitals:   04/29/16 1544  BP: (!) 141/83  Pulse: 72  Resp: 16  Weight: 184 lb (83.5 kg)  Height: 5\' 9"  (1.753 m)   Body mass index is 27.17 kg/m. Gen: WD/WN, NAD Head: [place]/AT, No temporalis wasting.  Ear/Nose/Throat: Hearing grossly intact, nares w/o erythema or drainage Eyes: PER, EOMI, sclera nonicteric.  Neck: Supple, no large masses.   Pulmonary:  Good air movement, no audible wheezing bilaterally, no use of accessory muscles.  Cardiac: RRR, no JVD Vascular: Both feet are pink and warm Vessel Right Left  Radial Palpable Palpable  Ulnar Palpable Palpable  Brachial Palpable Palpable  Carotid Palpable Palpable  Femoral Palpable Palpable  Popliteal Palpable Palpable  PT Palpable Palpable  DP Palpable Palpable  Gastrointestinal: Non-distended. No guarding/no peritoneal signs.  Musculoskeletal: M/S 5/5 throughout.  No deformity or atrophy.  Neurologic: CN 2-12 intact. Symmetrical.  Speech is fluent. Motor exam as listed above. Psychiatric: Judgment intact, Mood & affect appropriate for pt's clinical situation. Dermatologic: No rashes or ulcers noted.  No changes consistent with cellulitis. Lymph : No lichenification or skin changes of chronic lymphedema.  CBC Lab Results  Component Value Date   WBC 8.1 03/11/2016   HGB 12.8 (L) 03/11/2016   HCT 38.6 03/11/2016   MCV 100.5 (H) 03/11/2016   PLT 206 03/11/2016    BMET    Component Value Date/Time   NA 138 03/11/2016 0941   NA 139 06/05/2015 0940   K 4.2 03/11/2016 0941   CL 105 03/11/2016 0941   CO2 25 03/11/2016  0941   GLUCOSE 191 (H) 03/11/2016 0941   BUN 14 03/11/2016 0941   BUN 15 06/05/2015 0940   CREATININE 0.91 03/11/2016 0941   CALCIUM 9.4 03/11/2016 0941   GFRNONAA >60 02/06/2016 0751   GFRNONAA >60 10/16/2010 1641   GFRAA >60 02/06/2016 0751   GFRAA >60 10/16/2010 1641   CrCl cannot be calculated (Patient's most recent lab result is older than the maximum 21 days allowed.).  COAG Lab Results  Component Value Date   INR 1.20 02/23/2011   INR 1.27 02/23/2011   INR 1.22 02/22/2011    Radiology No results found.   Assessment/Plan 1. Atherosclerosis with claudication of extremity ([place]) Recommend:  The patient is status post successful angiogram with intervention.  The patient reports that the claudication symptoms and leg pain is essentially gone.   The patient denies lifestyle limiting changes at this point in time.  No further invasive studies, angiography or surgery at this time The patient should continue walking and  begin a more formal exercise program.  The patient should continue antiplatelet therapy and aggressive treatment of the lipid abnormalities  The patient should continue wearing graduated compression socks 10-15 mmHg strength to control the mild edema.  Patient should undergo noninvasive studies as ordered. The patient will follow up with me after the studies.   - VAS [place] ABI WITH/WO TBI; Future - VAS [place] LOWER EXTREMITY ARTERIAL DUPLEX; Future  2. Essential hypertension Continue antihypertensive medications as already ordered, these medications have been reviewed and there are no changes at this time.   3. Gastroesophageal reflux disease without esophagitis Continue PPI as already ordered, these medications have been reviewed and there are no changes at this time.   4. Mixed hyperlipidemia Continue statin as ordered and reviewed, no changes at this time     Hortencia Pilar, MD  04/29/2016 10:21 PM

## 2016-05-13 ENCOUNTER — Encounter: Payer: Self-pay | Admitting: *Deleted

## 2016-05-13 ENCOUNTER — Ambulatory Visit: Payer: Self-pay | Admitting: Primary Care

## 2016-06-06 ENCOUNTER — Ambulatory Visit: Payer: Self-pay | Admitting: *Deleted

## 2016-06-06 ENCOUNTER — Encounter: Payer: Self-pay | Admitting: Infectious Diseases

## 2016-06-06 VITALS — BP 138/82 | Ht 68.0 in | Wt 186.0 lb

## 2016-06-06 DIAGNOSIS — Z0000 Encounter for general adult medical examination without abnormal findings: Secondary | ICD-10-CM

## 2016-06-06 NOTE — Progress Notes (Signed)
[employer] insurance premium discount evaluation: Labs Drawn. Replacements ROI form signed. Tobacco Free Attestation form signed.  Forms placed in paper chart.  

## 2016-06-07 ENCOUNTER — Telehealth: Payer: Self-pay | Admitting: Pharmacist Clinician (PhC)/ Clinical Pharmacy Specialist

## 2016-06-07 ENCOUNTER — Other Ambulatory Visit: Payer: Self-pay | Admitting: Pharmacist Clinician (PhC)/ Clinical Pharmacy Specialist

## 2016-06-07 LAB — CMP12+LP+TP+TSH+6AC+PSA+CBC…
ALT: 85 IU/L — ABNORMAL HIGH (ref 0–44)
AST: 58 IU/L — ABNORMAL HIGH (ref 0–40)
Albumin/Globulin Ratio: 2 (ref 1.2–2.2)
Albumin: 4.9 g/dL (ref 3.5–5.5)
Alkaline Phosphatase: 68 IU/L (ref 39–117)
BUN/Creatinine Ratio: 16 (ref 9–20)
BUN: 16 mg/dL (ref 6–24)
Basophils Absolute: 0.1 10*3/uL (ref 0.0–0.2)
Basos: 1 %
Bilirubin Total: 0.5 mg/dL (ref 0.0–1.2)
Calcium: 9.9 mg/dL (ref 8.7–10.2)
Chloride: 96 mmol/L (ref 96–106)
Chol/HDL Ratio: 6.9 ratio — ABNORMAL HIGH (ref 0.0–5.0)
Cholesterol, Total: 214 mg/dL — ABNORMAL HIGH (ref 100–199)
Creatinine, Ser: 1.01 mg/dL (ref 0.76–1.27)
EOS (ABSOLUTE): 0.2 10*3/uL (ref 0.0–0.4)
Eos: 2 %
Estimated CHD Risk: 1.5 times avg. — ABNORMAL HIGH (ref 0.0–1.0)
Free Thyroxine Index: 1.4 (ref 1.2–4.9)
GFR calc Af Amer: 95 mL/min/{1.73_m2} (ref >59)
GFR calc non Af Amer: 82 mL/min/{1.73_m2} (ref >59)
GGT: 43 IU/L (ref 0–65)
Globulin, Total: 2.5 g/dL (ref 1.5–4.5)
Glucose: 222 mg/dL — ABNORMAL HIGH (ref 65–99)
HDL: 31 mg/dL — ABNORMAL LOW (ref >39)
Hematocrit: 40.7 % (ref 37.5–51.0)
Hemoglobin: 13.7 g/dL (ref 13.0–17.7)
Immature Grans (Abs): 0 10*3/uL (ref 0.0–0.1)
Immature Granulocytes: 0 %
Iron: 80 ug/dL (ref 38–169)
LDH: 221 IU/L (ref 121–224)
LDL Calculated: 130 mg/dL — ABNORMAL HIGH (ref 0–99)
Lymphocytes Absolute: 2.4 10*3/uL (ref 0.7–3.1)
Lymphs: 27 %
MCH: 33.2 pg — ABNORMAL HIGH (ref 26.6–33.0)
MCHC: 33.7 g/dL (ref 31.5–35.7)
MCV: 99 fL — ABNORMAL HIGH (ref 79–97)
Monocytes Absolute: 0.6 10*3/uL (ref 0.1–0.9)
Monocytes: 6 %
Neutrophils Absolute: 5.7 10*3/uL (ref 1.4–7.0)
Neutrophils: 64 %
Phosphorus: 3 mg/dL (ref 2.5–4.5)
Platelets: 300 10*3/uL (ref 150–379)
Potassium: 4.2 mmol/L (ref 3.5–5.2)
Prostate Specific Ag, Serum: 0.7 ng/mL (ref 0.0–4.0)
RBC: 4.13 x10E6/uL — ABNORMAL LOW (ref 4.14–5.80)
RDW: 13.5 % (ref 12.3–15.4)
Sodium: 132 mmol/L — ABNORMAL LOW (ref 134–144)
T3 Uptake Ratio: 22 % — ABNORMAL LOW (ref 24–39)
T4, Total: 6.5 ug/dL (ref 4.5–12.0)
TSH: 1.48 u[IU]/mL (ref 0.450–4.500)
Total Protein: 7.4 g/dL (ref 6.0–8.5)
Triglycerides: 266 mg/dL — ABNORMAL HIGH (ref 0–149)
Uric Acid: 6.3 mg/dL (ref 3.7–8.6)
VLDL Cholesterol Cal: 53 mg/dL — ABNORMAL HIGH (ref 5–40)
WBC: 8.9 10*3/uL (ref 3.4–10.8)

## 2016-06-07 LAB — HGB A1C W/O EAG: Hgb A1c MFr Bld: 8.5 % — ABNORMAL HIGH (ref 4.8–5.6)

## 2016-06-07 MED ORDER — DOLUTEGRAVIR SODIUM 50 MG PO TABS: 50.0000 mg | ORAL_TABLET | Freq: Every day | ORAL | 11 refills | Status: DC

## 2016-06-07 MED ORDER — TADALAFIL 10 MG PO TABS: 10.0000 mg | ORAL_TABLET | Freq: Every day | ORAL | 0 refills | Status: DC | PRN

## 2016-06-07 MED ORDER — EMTRICITABINE-TENOFOVIR AF 200-25 MG PO TABS: 1.0000 | ORAL_TABLET | Freq: Every day | ORAL | 11 refills | Status: DC

## 2016-06-07 NOTE — Telephone Encounter (Signed)
Jacob Ali email Dr. Johnnye Sima to see if he can take anything else that wouldn't interact with Viagra. He is currently on Genvoya so the interaction is there. Counseled him on the choice of Biktarvy vs Tivicay/Descovy. Due to his commercial insurance, Jacob Ali is not formulary yet so he elected to go with Tivicay/Descovy. He also is trying to setup an upcoming visit with a primary MD. He requested an one time fill for Cialis and he will get it from now on from his future primary MD. Going to bring him back in 6 wks for labs.

## 2016-06-11 ENCOUNTER — Telehealth: Payer: Self-pay | Admitting: Primary Care

## 2016-06-11 NOTE — Telephone Encounter (Signed)
-----   Message from Beckie Busing, RN sent at 06/11/2016  1:18 PM EDT ----- Please see attached for Jacob Ali's lab results drawn for employer insurance discount program. Please note elevated A1c and glucose. Was on Genvoya from Jan-May. Recently switched therapies 2/2 side effects. Genvoya known to cause hyperglycemia, glucosuria. He endorses polyuria (urinating every hour), polydipsia, polyphagia at least since beginning of year. Spoke with him today. Could you please have your office staff set up an appt for him with you soon? He has no scheduling preferences. I will also route these to his ID MD.  Please do not hesitate to contact me with any needs or concerns. Thank you, Cheri Guppy RN

## 2016-06-11 NOTE — Progress Notes (Addendum)
Spoke with pt regarding lab results. A1c elevated from 1 year ago. Glucose noted to be elevated 2 months ago from labs drawn at ID. He reports no one contacted him about those labs at that time. Endorses polyuria (urinating once an hour), polydipsia and polyphagia since the beginning of the year. Started Genvoya in January, which is reported to cause hyperglycemia and glucosuria. He stopped this and was switched to a dual therapy medication 3 days ago 2/2 other side effects, specifically ED.  LFTs elevated still, but improved. Lipid panel elevated. He reports eating and cooking more now that he has a roommate that he cooks for. Denies etoh use.  Educated pt on diet and lifestyle modifications regarding diabetes. Advised PCP appt soon. He requested that I ask the office to call him with an appt time when labs are routed. Also requested labs be routed to ID, Dr Johnnye Sima. Rechecked BP which was also slightly improved today.

## 2016-06-11 NOTE — Telephone Encounter (Signed)
Message left for patient to return my call.  

## 2016-06-11 NOTE — Telephone Encounter (Signed)
Thank you for this information, Hayley. We will get him scheduled at his convenience for diabetes evaluation and treatment. Vallarie Mare, please schedule.

## 2016-06-12 NOTE — Telephone Encounter (Signed)
Carrie scheduled pt an appt

## 2016-06-18 ENCOUNTER — Encounter: Payer: Self-pay | Admitting: Primary Care

## 2016-06-18 ENCOUNTER — Ambulatory Visit (INDEPENDENT_AMBULATORY_CARE_PROVIDER_SITE_OTHER): Payer: No Typology Code available for payment source | Admitting: Primary Care

## 2016-06-18 DIAGNOSIS — E119 Type 2 diabetes mellitus without complications: Secondary | ICD-10-CM | POA: Insufficient documentation

## 2016-06-18 NOTE — Progress Notes (Signed)
Subjective:    Patient ID: Jacob Ali, male    DOB: 03/05/1960, 56 y.o.   MRN: [id number]  HPI  Mr. Denning is a 56 year old male who presents today for evaluation of diabetes. He was evaluated by his infectious disease provider several weeks ago and was noted to have new onset of diabetes. His recent A1C was 8.5.  He denies a prior history of diabetes with an A1C of 4.4 one year prior. He's experienced symptoms of polyuria and polydipsia since January 2018 since he was initiated on [place]. His symptoms of polyuria and polydipsia have improved since he was removed from [facility] two weeks ago and switched to another regimen. He is due for repeat A1C in July through infectious disease.  Since his diagnosis of diabetes he has been working to make improvements in his diet and has started walking.  Diet currently consists of:  Breakfast: Sausage, cheese, eggs, toast, biscuit, bacon Lunch: Salad Dinner: Meat, vegetable, potatoes, pasta Snacks: Crackers, peanuts, chips Desserts: None Beverages: Regular soda, sweet tea, some water  Exercise: He does not currently exercising.   He denies numbness/tingling to his hands and feet, shortness of breath, chest pain, visual changes.    Review of Systems  Constitutional: Negative for fatigue.  Respiratory: Negative for shortness of breath.   Cardiovascular: Negative for chest pain.  Endocrine: Negative for polydipsia and polyuria.  Neurological: Negative for weakness and numbness.       Past Medical History:  Diagnosis Date  . Depression   . GERD (gastroesophageal reflux disease)   . HIV infection ([place])   . Hyperlipidemia   . Hypertension   . Peripheral vascular disease ([place])   . PONV (postoperative nausea and vomiting)   . Sleep apnea      Social History   Social History  . Marital status: Single    Spouse name: N/A  . Number of children: N/A  . Years of education: 7   Occupational History  . [place] History Main Topics  . Smoking status: Current Every Day Smoker    Packs/day: 0.50    Years: 40.00    Types: Cigars  . Smokeless tobacco: Never Used     Comment: 1/2PPD  . Alcohol use 1.8 oz/week    3 Standard drinks or equivalent per week     Comment: 2-3 times a week  . Drug use: Yes    Frequency: 2.0 times per week    Types: Marijuana  . Sexual activity: Not Currently    Partners: Male    Birth control/ protection: Condom     Comment: pt. given condoms   Other Topics Concern  . Not on file   Social History Narrative   Single, lives alone.      Past Surgical History:  Procedure Laterality Date  . Examination under anesthesia, repair of anal fissure,  12/04/2000  . PERIPHERAL VASCULAR CATHETERIZATION Left 02/06/2016   Procedure: Lower Extremity Angiography;  Surgeon: Katha Cabal, MD;  Location: [place] [facility];  Service: Cardiovascular;  Laterality: Left;  . PERIPHERAL VASCULAR CATHETERIZATION N/A 02/06/2016   Procedure: Abdominal Aortogram w/Lower Extremity;  Surgeon: Katha Cabal, MD;  Location: [place] [facility];  Service: Cardiovascular;  Laterality: N/A;  . PERIPHERAL VASCULAR CATHETERIZATION  02/06/2016   Procedure: Lower Extremity Intervention;  Surgeon: Katha Cabal, MD;  Location: [facility];  Service: Cardiovascular;;    Family History  Problem Relation Age  of Onset  . Throat cancer Father   . Heart disease Father   . Cancer Father        bladder  . Cancer Maternal Grandmother        colon    Allergies  Allergen Reactions  . Tamiflu [Oseltamivir Phosphate] Other (See Comments)    Depressive symptoms    Current Outpatient Prescriptions on File Prior to Visit  Medication Sig Dispense Refill  . aspirin (ASPIRIN CHILDRENS) 81 MG chewable tablet Chew 1 tablet (81 mg total) by mouth daily. Take with food 90 tablet 6  . clopidogrel (PLAVIX) 75 MG tablet Take 1 tablet (75 mg total) by mouth daily. 30 tablet 4  .  dolutegravir (TIVICAY) 50 MG tablet Take 1 tablet (50 mg total) by mouth daily. 30 tablet 11  . emtricitabine-tenofovir AF (DESCOVY) 200-25 MG tablet Take 1 tablet by mouth daily. 30 tablet 11  . gemfibrozil (LOPID) 600 MG tablet Take 1 tablet (600 mg total) by mouth daily. 90 tablet 2  . ibuprofen (ADVIL,MOTRIN) 800 MG tablet Take 1 tablet (800 mg total) by mouth daily as needed for moderate pain. 90 tablet 2  . lisinopril-hydrochlorothiazide (PRINZIDE,ZESTORETIC) 20-25 MG tablet Take 1 tablet by mouth daily. 90 tablet 3  . Misc Natural Products (GLUCOSAMINE CHOND MSM FORMULA PO) Take 1 tablet by mouth daily.    . pantoprazole (PROTONIX) 40 MG tablet Take 1 tablet (40 mg total) by mouth daily. 30 tablet 11  . tadalafil (CIALIS) 10 MG tablet Take 1 tablet (10 mg total) by mouth daily as needed for erectile dysfunction. 10 tablet 0  . nicotine polacrilex (NICORETTE) 4 MG gum Take 1 each (4 mg total) by mouth as needed for smoking cessation. (Patient not taking: Reported on 06/18/2016) 100 tablet 1   No current facility-administered medications on file prior to visit.     BP 124/70   Pulse 82   Temp 98.2 F (36.8 C) (Oral)   Ht 5\' 8"  (1.727 m)   Wt 188 lb 12.8 oz (85.6 kg)   SpO2 94%   BMI 28.71 kg/m    Objective:   Physical Exam  Constitutional: He appears well-nourished.  Neck: Neck supple.  Cardiovascular: Normal rate and regular rhythm.   Pulmonary/Chest: Effort normal and breath sounds normal.  Skin: Skin is warm and dry.          Assessment & Plan:

## 2016-06-18 NOTE — Patient Instructions (Signed)
It's important to improve your diet by reducing consumption of fried food, processed snack foods, starchy foods, sugary drinks. Increase consumption of fresh vegetables and fruits, whole grains, water.  Ensure you are drinking 64 ounces of water daily.  Start exercising. You should be getting 150 minutes of moderate intensity exercise weekly.  Follow up for repeat labs as scheduled in July. Please notify me of your result. Please call me if your symptoms of increased thirst and urination return.  It was a pleasure to see you today!  Diabetes Mellitus and Food It is important for you to manage your blood sugar (glucose) level. Your blood glucose level can be greatly affected by what you eat. Eating healthier foods in the appropriate amounts throughout the day at about the same time each day will help you control your blood glucose level. It can also help slow or prevent worsening of your diabetes mellitus. Healthy eating may even help you improve the level of your blood pressure and reach or maintain a healthy weight. General recommendations for healthful eating and cooking habits include:  Eating meals and snacks regularly. Avoid going long periods of time without eating to lose weight.  Eating a diet that consists mainly of plant-based foods, such as fruits, vegetables, nuts, legumes, and whole grains.  Using low-heat cooking methods, such as baking, instead of high-heat cooking methods, such as deep frying.  Work with your dietitian to make sure you understand how to use the Nutrition Facts information on food labels. How can food affect me? Carbohydrates Carbohydrates affect your blood glucose level more than any other type of food. Your dietitian will help you determine how many carbohydrates to eat at each meal and teach you how to count carbohydrates. Counting carbohydrates is important to keep your blood glucose at a healthy level, especially if you are using insulin or taking certain  medicines for diabetes mellitus. Alcohol Alcohol can cause sudden decreases in blood glucose (hypoglycemia), especially if you use insulin or take certain medicines for diabetes mellitus. Hypoglycemia can be a life-threatening condition. Symptoms of hypoglycemia (sleepiness, dizziness, and disorientation) are similar to symptoms of having too much alcohol. If your health care provider has given you approval to drink alcohol, do so in moderation and use the following guidelines:  Women should not have more than one drink per day, and men should not have more than two drinks per day. One drink is equal to: ? 12 oz of beer. ? 5 oz of wine. ? 1 oz of hard liquor.  Do not drink on an empty stomach.  Keep yourself hydrated. Have water, diet soda, or unsweetened iced tea.  Regular soda, juice, and other mixers might contain a lot of carbohydrates and should be counted.  What foods are not recommended? As you make food choices, it is important to remember that all foods are not the same. Some foods have fewer nutrients per serving than other foods, even though they might have the same number of calories or carbohydrates. It is difficult to get your body what it needs when you eat foods with fewer nutrients. Examples of foods that you should avoid that are high in calories and carbohydrates but low in nutrients include:  Trans fats (most processed foods list trans fats on the Nutrition Facts label).  Regular soda.  Juice.  Candy.  Sweets, such as cake, pie, doughnuts, and cookies.  Fried foods.  What foods can I eat? Eat nutrient-rich foods, which will nourish your body and keep  you healthy. The food you should eat also will depend on several factors, including:  The calories you need.  The medicines you take.  Your weight.  Your blood glucose level.  Your blood pressure level.  Your cholesterol level.  You should eat a variety of foods, including:  Protein. ? Lean cuts of  meat. ? Proteins low in saturated fats, such as fish, egg whites, and beans. Avoid processed meats.  Fruits and vegetables. ? Fruits and vegetables that may help control blood glucose levels, such as apples, mangoes, and yams.  Dairy products. ? Choose fat-free or low-fat dairy products, such as milk, yogurt, and cheese.  Grains, bread, pasta, and rice. ? Choose whole grain products, such as multigrain bread, whole oats, and brown rice. These foods may help control blood pressure.  Fats. ? Foods containing healthful fats, such as nuts, avocado, olive oil, canola oil, and fish.  Does everyone with diabetes mellitus have the same meal plan? Because every person with diabetes mellitus is different, there is not one meal plan that works for everyone. It is very important that you meet with a dietitian who will help you create a meal plan that is just right for you. This information is not intended to replace advice given to you by your health care provider. Make sure you discuss any questions you have with your health care provider. Document Released: 09/27/2004 Document Revised: 06/08/2015 Document Reviewed: 11/27/2012 Elsevier Interactive Patient Education  2017 [employer].

## 2016-06-18 NOTE — Assessment & Plan Note (Signed)
Very likely secondary to medication use of Genvoya, especially given A1c of 4.4 one year ago. Symptoms have resolved since he's transitioned from [facility] to another treatment for HIV. Strongly recommended he improve his diet and start exercising. Will allow him to work on those improvements and have him rechecked as scheduled in July. If A1c not trending downward at that time, will initiate treatment.

## 2016-07-14 ENCOUNTER — Other Ambulatory Visit (INDEPENDENT_AMBULATORY_CARE_PROVIDER_SITE_OTHER): Payer: Self-pay | Admitting: Vascular Surgery

## 2016-07-23 ENCOUNTER — Ambulatory Visit (INDEPENDENT_AMBULATORY_CARE_PROVIDER_SITE_OTHER)
Payer: No Typology Code available for payment source | Admitting: Pharmacist Clinician (PhC)/ Clinical Pharmacy Specialist

## 2016-07-23 ENCOUNTER — Encounter: Payer: Self-pay | Admitting: Infectious Diseases

## 2016-07-23 ENCOUNTER — Other Ambulatory Visit: Payer: Self-pay | Admitting: Infectious Diseases

## 2016-07-23 DIAGNOSIS — B20 Human immunodeficiency virus [HIV] disease: Secondary | ICD-10-CM | POA: Diagnosis not present

## 2016-07-23 NOTE — Patient Instructions (Addendum)
Continue your Tivicay and Descovy Follow up with Dr. Johnnye Sima in Oct

## 2016-07-23 NOTE — Progress Notes (Signed)
HPI: Jacob Ali is a 56 y.o. male who is here to see pharmacy for labs after his ART change.   Allergies: Allergies  Allergen Reactions  . Tamiflu [Oseltamivir Phosphate] Other (See Comments)    Depressive symptoms    Vitals:    Past Medical History: Past Medical History:  Diagnosis Date  . Depression   . GERD (gastroesophageal reflux disease)   . HIV infection ([place])   . Hyperlipidemia   . Hypertension   . Peripheral vascular disease ([place])   . PONV (postoperative nausea and vomiting)   . Sleep apnea     Social History: Social History   Social History  . Marital status: Single    Spouse name: N/A  . Number of children: N/A  . Years of education: 8   Occupational History  . [place] History Main Topics  . Smoking status: Current Every Day Smoker    Packs/day: 0.50    Years: 40.00    Types: Cigars  . Smokeless tobacco: Never Used     Comment: 1/2PPD  . Alcohol use 1.8 oz/week    3 Standard drinks or equivalent per week     Comment: 2-3 times a week  . Drug use: Yes    Frequency: 2.0 times per week    Types: Marijuana  . Sexual activity: Not Currently    Partners: Male    Birth control/ protection: Condom     Comment: pt. given condoms   Other Topics Concern  . Not on file   Social History Narrative   Single, lives alone.      Previous Regimen: Genvoya  Current Regimen: Tivicay/Descovy  Labs: HIV 1 RNA Quant (copies/mL)  Date Value  03/11/2016 <20 DETECTED (A)  11/24/2015 26 (H)  05/17/2015 30 (H)   CD4 (no units)  Date Value  11/24/2015 1,130  05/17/2015 1,550  09/30/2014 952   CD4 T Cell Abs (/uL)  Date Value  03/11/2016 1,030   Hep B S Ab (no units)  Date Value  10/16/2010 POS (A)   Hepatitis B Surface Ag (no units)  Date Value  10/16/2010 NEGATIVE   HCV Ab (no units)  Date Value  10/16/2010 NEGATIVE    CrCl: CrCl cannot be calculated (Patient's most recent lab result is older than  the maximum 21 days allowed.).  Lipids:    Component Value Date/Time   CHOL 214 (H) 06/06/2016 0941   TRIG 266 (H) 06/06/2016 0941   HDL 31 (L) 06/06/2016 0941   CHOLHDL 6.9 (H) 06/06/2016 0941   CHOLHDL 8.4 (H) 03/11/2016 0941   VLDL 60 (H) 03/11/2016 0941   LDLCALC 130 (H) 06/06/2016 0941    Assessment: We recently changed his ART to Tivicay and Descovy due to the interaction with his Genvoya. He likes his new regimen a lot. He doesn't miss doses and has had not side effects so far. He has been followed by [facility] Friendly for his diabetes. His A1C recently came up to 8.5 for some reason. Belenda Cruise would like [place] to repeat his A1C when we do his HIV labs today. His CD4 is fantastic. Will set him up for a visit with Dr. Johnnye Sima in Oct.   Recommendations:  Cont Tivicay and Descovy  HIV labs today F/u with Dr. Johnnye Sima in Oct  Quanisha Drewry, PharmD, BCPS, [place], [place] [facility] 07/23/2016, 4:05 PM

## 2016-07-29 ENCOUNTER — Ambulatory Visit (INDEPENDENT_AMBULATORY_CARE_PROVIDER_SITE_OTHER): Payer: No Typology Code available for payment source | Admitting: Vascular Surgery

## 2016-07-29 ENCOUNTER — Ambulatory Visit (INDEPENDENT_AMBULATORY_CARE_PROVIDER_SITE_OTHER): Payer: No Typology Code available for payment source

## 2016-07-29 DIAGNOSIS — I10 Essential (primary) hypertension: Secondary | ICD-10-CM

## 2016-07-29 DIAGNOSIS — I70219 Atherosclerosis of native arteries of extremities with intermittent claudication, unspecified extremity: Secondary | ICD-10-CM

## 2016-07-29 DIAGNOSIS — K219 Gastro-esophageal reflux disease without esophagitis: Secondary | ICD-10-CM

## 2016-07-29 DIAGNOSIS — E119 Type 2 diabetes mellitus without complications: Secondary | ICD-10-CM

## 2016-07-31 NOTE — Progress Notes (Signed)
MRN : [id number]  Jacob Ali is a 56 y.o. (12-02-1960) male who presents with chief complaint of No chief complaint on file. Marland Kitchen  History of Present Illness:The patient returns to the office for followup and review of the noninvasive studies. There have been no interval changes in lower extremity symptoms. No interval shortening of the patient's claudication distance or development of rest pain symptoms. No new ulcers or wounds have occurred since the last visit.  There have been no significant changes to the patient's overall health care.  The patient denies amaurosis fugax or recent TIA symptoms. There are no recent neurological changes noted. The patient denies history of DVT, PE or superficial thrombophlebitis. The patient denies recent episodes of angina or shortness of breath.     No outpatient prescriptions have been marked as taking for the 07/29/16 encounter (Office Visit) with Delana Meyer, Dolores Lory, MD.    Past Medical History:  Diagnosis Date  . Depression   . GERD (gastroesophageal reflux disease)   . HIV infection ([place])   . Hyperlipidemia   . Hypertension   . Peripheral vascular disease ([place])   . PONV (postoperative nausea and vomiting)   . Sleep apnea     Past Surgical History:  Procedure Laterality Date  . Examination under anesthesia, repair of anal fissure,  12/04/2000  . PERIPHERAL VASCULAR CATHETERIZATION Left 02/06/2016   Procedure: Lower Extremity Angiography;  Surgeon: Katha Cabal, MD;  Location: [facility];  Service: Cardiovascular;  Laterality: Left;  . PERIPHERAL VASCULAR CATHETERIZATION N/A 02/06/2016   Procedure: Abdominal Aortogram w/Lower Extremity;  Surgeon: Katha Cabal, MD;  Location: [facility];  Service: Cardiovascular;  Laterality: N/A;  . PERIPHERAL VASCULAR CATHETERIZATION  02/06/2016   Procedure: Lower Extremity Intervention;  Surgeon: Katha Cabal, MD;  Location: [facility];  Service:  Cardiovascular;;    Social History Social History  Substance Use Topics  . Smoking status: Current Every Day Smoker    Packs/day: 0.50    Years: 40.00    Types: Cigars  . Smokeless tobacco: Never Used     Comment: 1/2PPD  . Alcohol use 1.8 oz/week    3 Standard drinks or equivalent per week     Comment: 2-3 times a week    Family History Family History  Problem Relation Age of Onset  . Throat cancer Father   . Heart disease Father   . Cancer Father        bladder  . Cancer Maternal Grandmother        colon    Allergies  Allergen Reactions  . Tamiflu [Oseltamivir Phosphate] Other (See Comments)    Depressive symptoms     REVIEW OF SYSTEMS (Negative unless checked)  Constitutional: [] Weight loss  [] Fever  [] Chills Cardiac: [] Chest pain   [] Chest pressure   [] Palpitations   [] Shortness of breath when laying flat   [] Shortness of breath with exertion. Vascular:  [x] Pain in legs with walking   [] Pain in legs at rest  [] History of DVT   [] Phlebitis   [] Swelling in legs   [] Varicose veins   [] Non-healing ulcers Pulmonary:   [] Uses home oxygen   [] Productive cough   [] Hemoptysis   [] Wheeze  [] COPD   [] Asthma Neurologic:  [] Dizziness   [] Seizures   [] History of stroke   [] History of TIA  [] Aphasia   [] Vissual changes   [] Weakness or numbness in arm   [] Weakness or numbness in leg Musculoskeletal:   [] Joint  swelling   [] Joint pain   [] Low back pain Hematologic:  [] Easy bruising  [] Easy bleeding   [] Hypercoagulable state   [] Anemic Gastrointestinal:  [] Diarrhea   [] Vomiting  [] Gastroesophageal reflux/heartburn   [] Difficulty swallowing. Genitourinary:  [] Chronic kidney disease   [] Difficult urination  [] Frequent urination   [] Blood in urine Skin:  [] Rashes   [] Ulcers  Psychological:  [] History of anxiety   []  History of major depression.  Physical Examination  There were no vitals filed for this visit. There is no height or weight on file to calculate BMI. Gen: WD/WN,  NAD Head: [place]/AT, No temporalis wasting.  Ear/Nose/Throat: Hearing grossly intact, nares w/o erythema or drainage Eyes: PER, EOMI, sclera nonicteric.  Neck: Supple, no large masses.   Pulmonary:  Good air movement, no audible wheezing bilaterally, no use of accessory muscles.  Cardiac: RRR, no JVD Vascular: Vessel Right Left  Radial Palpable Palpable  Ulnar Palpable Palpable  Brachial Palpable Palpable  Carotid Palpable Palpable  Femoral Palpable Palpable  Popliteal Not Palpable Not Palpable  PT Not Palpable Not Palpable  DP Not Palpable Not Palpable  Gastrointestinal: Non-distended. No guarding/no peritoneal signs.  Musculoskeletal: M/S 5/5 throughout.  No deformity or atrophy.  Neurologic: CN 2-12 intact. Symmetrical.  Speech is fluent. Motor exam as listed above. Psychiatric: Judgment intact, Mood & affect appropriate for pt's clinical situation. Dermatologic: No rashes or ulcers noted.  No changes consistent with cellulitis. Lymph : No lichenification or skin changes of chronic lymphedema.  CBC Lab Results  Component Value Date   WBC 8.9 06/06/2016   HGB 13.7 06/06/2016   HCT 40.7 06/06/2016   MCV 99 (H) 06/06/2016   PLT 300 06/06/2016    BMET    Component Value Date/Time   NA 132 (L) 06/06/2016 0941   K 4.2 06/06/2016 0941   CL 96 06/06/2016 0941   CO2 25 03/11/2016 0941   GLUCOSE 222 (H) 06/06/2016 0941   GLUCOSE 191 (H) 03/11/2016 0941   BUN 16 06/06/2016 0941   CREATININE 1.01 06/06/2016 0941   CREATININE 0.91 03/11/2016 0941   CALCIUM 9.9 06/06/2016 0941   GFRNONAA 82 06/06/2016 0941   GFRNONAA >60 10/16/2010 1641   GFRAA 95 06/06/2016 0941   GFRAA >60 10/16/2010 1641   CrCl cannot be calculated (Patient's most recent lab result is older than the maximum 21 days allowed.).  COAG Lab Results  Component Value Date   INR 1.20 02/23/2011   INR 1.27 02/23/2011   INR 1.22 02/22/2011    Radiology No results found.   Assessment/Plan 1.  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  The patient does not voice lifestyle limiting changes at this point in time.  Noninvasive studies do not suggest clinically significant change.  No invasive studies, angiography or surgery at this time The patient should continue walking and begin a more formal exercise program.  The patient should continue antiplatelet therapy and aggressive treatment of the lipid abnormalities  No changes in the patient's medications at this time  The patient should continue wearing graduated compression socks 10-15 mmHg strength to control the mild edema.    2. Essential hypertension Continue antihypertensive medications as already ordered, these medications have been reviewed and there are no changes at this time.   3. Gastroesophageal reflux disease without esophagitis Continue PPI as already ordered, these medications have been reviewed and there are no changes at this time.   4.  Type 2 diabetes mellitus without complication, without long-term current use of insulin (HCC) Continue hypoglycemic medications as already ordered, these medications have been reviewed and there are no changes at this time.  Hgb A1C to be monitored as already arranged by primary service     Hortencia Pilar, MD  07/31/2016 9:56 PM

## 2016-08-01 ENCOUNTER — Encounter: Payer: Self-pay | Admitting: Infectious Diseases

## 2016-08-02 ENCOUNTER — Encounter (INDEPENDENT_AMBULATORY_CARE_PROVIDER_SITE_OTHER): Payer: Self-pay | Admitting: Vascular Surgery

## 2016-08-02 ENCOUNTER — Encounter: Payer: Self-pay | Admitting: Primary Care

## 2016-08-08 LAB — HIV-1 RNA QUANT-NO REFLEX-BLD
HIV 1 RNA Quant: <20 copies/mL
HIV-1 RNA Quant, Log: <1.3 Log copies/mL

## 2016-08-08 LAB — BASIC METABOLIC PANEL
BUN: 14 mg/dL (ref 7–25)
CO2: 25 mmol/L (ref 20–31)
Calcium: 9.8 mg/dL (ref 8.6–10.3)
Chloride: 103 mmol/L (ref 98–110)
Creat: 1.09 mg/dL (ref 0.70–1.33)
Glucose, Bld: 81 mg/dL (ref 65–99)
Potassium: 3.9 mmol/L (ref 3.5–5.3)
Sodium: 136 mmol/L (ref 135–146)

## 2016-08-08 LAB — HEMOGLOBIN A1C: Hgb A1c MFr Bld: <4 % (ref <5.7)

## 2016-08-16 ENCOUNTER — Other Ambulatory Visit: Payer: Self-pay | Admitting: Infectious Diseases

## 2016-08-16 ENCOUNTER — Telehealth: Payer: Self-pay

## 2016-08-16 NOTE — Telephone Encounter (Signed)
Request from pharmacy for Cialis refill.  Last note with pharmacist patient requested a one time refill and he would follow up with his primary physican for further refills.  Refill denied.  Patient should contact his primary care physician.   Laverle Patter, RN

## 2016-08-20 ENCOUNTER — Other Ambulatory Visit: Payer: Self-pay | Admitting: Infectious Diseases

## 2016-09-11 ENCOUNTER — Ambulatory Visit: Payer: No Typology Code available for payment source | Admitting: Primary Care

## 2016-09-12 ENCOUNTER — Other Ambulatory Visit: Payer: Self-pay | Admitting: Pharmacist

## 2016-09-17 ENCOUNTER — Ambulatory Visit: Payer: Self-pay | Admitting: Registered Nurse

## 2016-09-17 VITALS — BP 120/74 | HR 80 | Temp 98.6°F

## 2016-09-17 DIAGNOSIS — H6592 Unspecified nonsuppurative otitis media, left ear: Secondary | ICD-10-CM

## 2016-09-17 DIAGNOSIS — J0101 Acute recurrent maxillary sinusitis: Secondary | ICD-10-CM

## 2016-09-17 DIAGNOSIS — J209 Acute bronchitis, unspecified: Secondary | ICD-10-CM

## 2016-09-17 DIAGNOSIS — H65191 Other acute nonsuppurative otitis media, right ear: Secondary | ICD-10-CM

## 2016-09-17 MED ORDER — FLUTICASONE PROPIONATE 50 MCG/ACT NA SUSP: 1.0000 | Freq: Two times a day (BID) | NASAL | 0 refills | Status: DC

## 2016-09-17 MED ORDER — SALINE SPRAY 0.65 % NA SOLN: 2.0000 | NASAL | 0 refills | Status: DC

## 2016-09-17 MED ORDER — DOXYCYCLINE HYCLATE 100 MG PO TABS: 100.0000 mg | ORAL_TABLET | Freq: Two times a day (BID) | ORAL | 0 refills | Status: DC

## 2016-09-17 NOTE — Patient Instructions (Signed)
Acute Bronchitis, Adult Acute bronchitis is when air tubes (bronchi) in the lungs suddenly get swollen. The condition can make it hard to breathe. It can also cause these symptoms:  A cough.  Coughing up clear, yellow, or green mucus.  Wheezing.  Chest congestion.  Shortness of breath.  A fever.  Body aches.  Chills.  A sore throat.  Follow these instructions at home: Medicines  Take over-the-counter and prescription medicines only as told by your doctor.  If you were prescribed an antibiotic medicine, take it as told by your doctor. Do not stop taking the antibiotic even if you start to feel better. General instructions  Rest.  Drink enough fluids to keep your pee (urine) clear or pale yellow.  Avoid smoking and secondhand smoke. If you smoke and you need help quitting, ask your doctor. Quitting will help your lungs heal faster.  Use an inhaler, cool mist vaporizer, or humidifier as told by your doctor.  Keep all follow-up visits as told by your doctor. This is important. How is this prevented? To lower your risk of getting this condition again:  Wash your hands often with soap and water. If you cannot use soap and water, use hand sanitizer.  Avoid contact with people who have cold symptoms.  Try not to touch your hands to your mouth, nose, or eyes.  Make sure to get the flu shot every year.  Contact a doctor if:  Your symptoms do not get better in 2 weeks. Get help right away if:  You cough up blood.  You have chest pain.  You have very bad shortness of breath.  You become dehydrated.  You faint (pass out) or keep feeling like you are going to pass out.  You keep throwing up (vomiting).  You have a very bad headache.  Your fever or chills gets worse. This information is not intended to replace advice given to you by your health care provider. Make sure you discuss any questions you have with your health care provider. Document Released:  06/19/2007 Document Revised: 08/09/2015 Document Reviewed: 06/21/2015 Elsevier Interactive Patient Education  2017 [employer]. Otitis Media, Adult Otitis media is redness, soreness, and puffiness (swelling) in the space just behind your eardrum (middle ear). It may be caused by allergies or infection. It often happens along with a cold. Follow these instructions at home:  Take your medicine as told. Finish it even if you start to feel better.  Only take over-the-counter or prescription medicines for pain, discomfort, or fever as told by your doctor.  Follow up with your doctor as told. Contact a doctor if:  You have otitis media only in one ear, or bleeding from your nose, or both.  You notice a lump on your neck.  You are not getting better in 3-5 days.  You feel worse instead of better. Get help right away if:  You have pain that is not helped with medicine.  You have puffiness, redness, or pain around your ear.  You get a stiff neck.  You cannot move part of your face (paralysis).  You notice that the bone behind your ear hurts when you touch it. This information is not intended to replace advice given to you by your health care provider. Make sure you discuss any questions you have with your health care provider. Document Released: 06/19/2007 Document Revised: 06/08/2015 Document Reviewed: 07/28/2012 Elsevier Interactive Patient Education  2017 [employer] Sinus Rinse What is a sinus rinse? A sinus rinse is  a simple home treatment that is used to rinse your sinuses with a sterile mixture of salt and water (saline solution). Sinuses are air-filled spaces in your skull behind the bones of your face and forehead that open into your nasal cavity. You will use the following:  Saline solution.  Neti pot or spray bottle. This releases the saline solution into your nose and through your sinuses. Neti pots and spray bottles can be purchased at [employer], a health  food store, or online.  When would I do a sinus rinse? A sinus rinse can help to clear mucus, dirt, dust, or pollen from the nasal cavity. You may do a sinus rinse when you have a cold, a virus, nasal allergy symptoms, a sinus infection, or stuffiness in the nose or sinuses. If you are considering a sinus rinse:  Ask your child's health care provider before performing a sinus rinse on your child.  Do not do a sinus rinse if you have had ear or nasal surgery, ear infection, or blocked ears.  How do I do a sinus rinse?  Wash your hands.  Disinfect your device according to the directions provided and then dry it.  Use the solution that comes with your device or one that is sold separately in stores. Follow the mixing directions on the package.  Fill your device with the amount of saline solution as directed by the device instructions.  Stand over a sink and tilt your head sideways over the sink.  Place the spout of the device in your upper nostril (the one closer to the ceiling).  Gently pour or squeeze the saline solution into the nasal cavity. The liquid should drain to the lower nostril if you are not overly congested.  Gently blow your nose. Blowing too hard may cause ear pain.  Repeat in the other nostril.  Clean and rinse your device with clean water and then air-dry it. Are there risks of a sinus rinse? Sinus rinse is generally very safe and effective. However, there are a few risks, which include:  A burning sensation in the sinuses. This may happen if you do not make the saline solution as directed. Make sure to follow all directions when making the saline solution.  Infection from contaminated water. This is rare, but possible.  Nasal irritation.  This information is not intended to replace advice given to you by your health care provider. Make sure you discuss any questions you have with your health care provider. Document Released: 07/28/2013 Document Revised:  11/28/2015 Document Reviewed: 05/18/2013 Elsevier Interactive Patient Education  2017 [employer] Sinusitis, Adult Sinusitis is soreness and inflammation of your sinuses. Sinuses are hollow spaces in the bones around your face. Your sinuses are located:  Around your eyes.  In the middle of your forehead.  Behind your nose.  In your cheekbones.  Your sinuses and nasal passages are lined with a stringy fluid (mucus). Mucus normally drains out of your sinuses. When your nasal tissues become inflamed or swollen, the mucus can become trapped or blocked so air cannot flow through your sinuses. This allows bacteria, viruses, and funguses to grow, which leads to infection. Sinusitis can develop quickly and last for 7?10 days (acute) or for more than 12 weeks (chronic). Sinusitis often develops after a cold. What are the causes? This condition is caused by anything that creates swelling in the sinuses or stops mucus from draining, including:  Allergies.  Asthma.  Bacterial or viral infection.  Abnormally shaped  bones between the nasal passages.  Nasal growths that contain mucus (nasal polyps).  Narrow sinus openings.  Pollutants, such as chemicals or irritants in the air.  A foreign object stuck in the nose.  A fungal infection. This is rare.  What increases the risk? The following factors may make you more likely to develop this condition:  Having allergies or asthma.  Having had a recent cold or respiratory tract infection.  Having structural deformities or blockages in your nose or sinuses.  Having a weak immune system.  Doing a lot of swimming or diving.  Overusing nasal sprays.  Smoking.  What are the signs or symptoms? The main symptoms of this condition are pain and a feeling of pressure around the affected sinuses. Other symptoms include:  Upper toothache.  Earache.  Headache.  Bad breath.  Decreased sense of smell and taste.  A cough that may get  worse at night.  Fatigue.  Fever.  Thick drainage from your nose. The drainage is often green and it may contain pus (purulent).  Stuffy nose or congestion.  Postnasal drip. This is when extra mucus collects in the throat or back of the nose.  Swelling and warmth over the affected sinuses.  Sore throat.  Sensitivity to light.  How is this diagnosed? This condition is diagnosed based on symptoms, a medical history, and a physical exam. To find out if your condition is acute or chronic, your health care provider may:  Look in your nose for signs of nasal polyps.  Tap over the affected sinus to check for signs of infection.  View the inside of your sinuses using an imaging device that has a light attached (endoscope).  If your health care provider suspects that you have chronic sinusitis, you may also:  Be tested for allergies.  Have a sample of mucus taken from your nose (nasal culture) and checked for bacteria.  Have a mucus sample examined to see if your sinusitis is related to an allergy.  If your sinusitis does not respond to treatment and it lasts longer than 8 weeks, you may have an MRI or CT scan to check your sinuses. These scans also help to determine how severe your infection is. In rare cases, a bone biopsy may be done to rule out more serious types of fungal sinus disease. How is this treated? Treatment for sinusitis depends on the cause and whether your condition is chronic or acute. If a virus is causing your sinusitis, your symptoms will go away on their own within 10 days. You may be given medicines to relieve your symptoms, including:  Topical nasal decongestants. They shrink swollen nasal passages and let mucus drain from your sinuses.  Antihistamines. These drugs block inflammation that is triggered by allergies. This can help to ease swelling in your nose and sinuses.  Topical nasal corticosteroids. These are nasal sprays that ease inflammation and  swelling in your nose and sinuses.  Nasal saline washes. These rinses can help to get rid of thick mucus in your nose.  If your condition is caused by bacteria, you will be given an antibiotic medicine. If your condition is caused by a fungus, you will be given an antifungal medicine. Surgery may be needed to correct underlying conditions, such as narrow nasal passages. Surgery may also be needed to remove polyps. Follow these instructions at home: Medicines  Take, use, or apply over-the-counter and prescription medicines only as told by your health care provider. These may include nasal sprays.  If you were prescribed an antibiotic medicine, take it as told by your health care provider. Do not stop taking the antibiotic even if you start to feel better. Hydrate and Humidify  Drink enough water to keep your urine clear or pale yellow. Staying hydrated will help to thin your mucus.  Use a cool mist humidifier to keep the humidity level in your home above 50%.  Inhale steam for 10-15 minutes, 3-4 times a day or as told by your health care provider. You can do this in the bathroom while a hot shower is running.  Limit your exposure to cool or dry air. Rest  Rest as much as possible.  Sleep with your head raised (elevated).  Make sure to get enough sleep each night. General instructions  Apply a warm, moist washcloth to your face 3-4 times a day or as told by your health care provider. This will help with discomfort.  Wash your hands often with soap and water to reduce your exposure to viruses and other germs. If soap and water are not available, use hand sanitizer.  Do not smoke. Avoid being around people who are smoking (secondhand smoke).  Keep all follow-up visits as told by your health care provider. This is important. Contact a health care provider if:  You have a fever.  Your symptoms get worse.  Your symptoms do not improve within 10 days. Get help right away  if:  You have a severe headache.  You have persistent vomiting.  You have pain or swelling around your face or eyes.  You have vision problems.  You develop confusion.  Your neck is stiff.  You have trouble breathing. This information is not intended to replace advice given to you by your health care provider. Make sure you discuss any questions you have with your health care provider. Document Released: 12/31/2004 Document Revised: 08/27/2015 Document Reviewed: 10/26/2014 Elsevier Interactive Patient Education  2017 [employer].

## 2016-09-17 NOTE — Progress Notes (Signed)
Subjective:    Patient ID: Jacob Ali, male    DOB: September 06, 1960, 56 y.o.   MRN: [id number]  55y/o established caucasian male Pt reports nasal and chest congestion x1 week. Productive cough with thin, watery mucus. Denies ear pain, sore throat, fever. Reports wheezing when lying down for bed and in the morning upon waking with more frequent cough. Reports checking pulse ox at home this weekend and O2 was reading 84%. Today is 93%. No ShOB, speaking in full sentences without difficulty.  Last antibiotics Aug 2017 for similar symptoms doxycycline tolerated without side effects patient stated this feels very similar  Has flonase at home but hasn't been using it      Review of Systems  Constitutional: Negative for activity change, appetite change, chills, diaphoresis, fatigue, fever and unexpected weight change.  HENT: Positive for congestion, postnasal drip, rhinorrhea, sinus pain and sinus pressure. Negative for dental problem, drooling, ear discharge, ear pain, facial swelling, hearing loss, mouth sores, nosebleeds, sneezing, sore throat, tinnitus, trouble swallowing and voice change.   Eyes: Negative for photophobia, pain, discharge, redness, itching and visual disturbance.  Respiratory: Positive for cough and wheezing. Negative for choking, chest tightness, shortness of breath and stridor.   Cardiovascular: Negative for chest pain, palpitations and leg swelling.  Gastrointestinal: Negative for abdominal distention, abdominal pain, blood in stool, constipation, diarrhea, nausea and vomiting.  Endocrine: Negative for cold intolerance and heat intolerance.  Genitourinary: Negative for dysuria.  Musculoskeletal: Negative for arthralgias, back pain, gait problem, joint swelling, myalgias, neck pain and neck stiffness.  Skin: Negative for color change, pallor, rash and wound.  Allergic/Immunologic: Positive for environmental allergies. Negative for food allergies and immunocompromised state.   Neurological: Negative for dizziness, tremors, seizures, syncope, facial asymmetry, speech difficulty, weakness, light-headedness, numbness and headaches.  Hematological: Negative for adenopathy. Does not bruise/bleed easily.  Psychiatric/Behavioral: Negative for agitation, behavioral problems, confusion and sleep disturbance.       Objective:   Physical Exam  Constitutional: He is oriented to person, place, and time. Vital signs are normal. He appears well-developed and well-nourished. He is active and cooperative.  Non-toxic appearance. He does not have a sickly appearance. He appears ill. No distress.  HENT:  Head: Normocephalic and atraumatic.  Right Ear: Hearing, external ear and ear canal normal. Tympanic membrane is injected, erythematous and bulging. A middle ear effusion is present.  Left Ear: Hearing, external ear and ear canal normal. Tympanic membrane is injected, erythematous and bulging. A middle ear effusion is present.  Nose: Mucosal edema and rhinorrhea present. No nose lacerations, sinus tenderness, nasal deformity, septal deviation or nasal septal hematoma. No epistaxis.  No foreign bodies. Right sinus exhibits maxillary sinus tenderness. Right sinus exhibits no frontal sinus tenderness. Left sinus exhibits maxillary sinus tenderness. Left sinus exhibits no frontal sinus tenderness.  Mouth/Throat: Uvula is midline and mucous membranes are normal. Mucous membranes are not pale, not dry and not cyanotic. He does not have dentures. No oral lesions. No trismus in the jaw. Normal dentition. No dental abscesses, uvula swelling, lacerations or dental caries. Posterior oropharyngeal edema and posterior oropharyngeal erythema present. No oropharyngeal exudate or tonsillar abscesses.  Bilateral TMs bulging clear fluid erythematous and injected TM 11-3 oclock; cobblestoning posterior pharynx; bilateral allergic shiners lower eyelids swollen 2+/4 nonpitting; bilateral nasal turbinates  edema/erythema clear discharge  Eyes: Pupils are equal, round, and reactive to light. Conjunctivae, EOM and lids are normal. Right eye exhibits no chemosis, no discharge, no exudate and no hordeolum. No  foreign body present in the right eye. Left eye exhibits no chemosis, no discharge, no exudate and no hordeolum. No foreign body present in the left eye. Right conjunctiva is not injected. Right conjunctiva has no hemorrhage. Left conjunctiva is not injected. Left conjunctiva has no hemorrhage. No scleral icterus. Right eye exhibits normal extraocular motion and no nystagmus. Left eye exhibits normal extraocular motion and no nystagmus. Right pupil is round and reactive. Left pupil is round and reactive. Pupils are equal.  Neck: Trachea normal, normal range of motion and phonation normal. Neck supple. No tracheal tenderness, no spinous process tenderness and no muscular tenderness present. No neck rigidity. No tracheal deviation, no edema, no erythema and normal range of motion present. No thyroid mass and no thyromegaly present.  Cardiovascular: Normal rate, regular rhythm, S1 normal, S2 normal, normal heart sounds and intact distal pulses.  PMI is not displaced.  Exam reveals no gallop and no friction rub.   No murmur heard. Pulmonary/Chest: No accessory muscle usage or stridor. No respiratory distress. He has no decreased breath sounds. He has wheezes in the right upper field and the right middle field. He has no rhonchi. He has no rales.  Spoke full sentences without difficulty; inspiratory wheeze fine RUF/RMF with deep breaths; cough not observed in exam room  Abdominal: Soft. Normal appearance. He exhibits no distension, no fluid wave and no ascites. There is no rigidity and no guarding.  Musculoskeletal: Normal range of motion. He exhibits no edema or tenderness.       Right shoulder: Normal.       Left shoulder: Normal.       Right elbow: Normal.      Left elbow: Normal.       Right hip: Normal.        Left hip: Normal.       Right knee: Normal.       Left knee: Normal.       Cervical back: Normal.       Thoracic back: Normal.       Lumbar back: Normal.       Right hand: Normal.       Left hand: Normal.  Lymphadenopathy:       Head (right side): No submental, no submandibular, no tonsillar, no preauricular, no posterior auricular and no occipital adenopathy present.       Head (left side): No submental, no submandibular, no tonsillar, no preauricular, no posterior auricular and no occipital adenopathy present.    He has no cervical adenopathy.       Right cervical: No superficial cervical, no deep cervical and no posterior cervical adenopathy present.      Left cervical: No superficial cervical, no deep cervical and no posterior cervical adenopathy present.  Neurological: He is alert and oriented to person, place, and time. He has normal strength. He is not disoriented. He displays no atrophy and no tremor. No cranial nerve deficit or sensory deficit. He exhibits normal muscle tone. He displays no seizure activity. Coordination and gait normal. GCS eye subscore is 4. GCS verbal subscore is 5. GCS motor subscore is 6.  On/off exam table; in/out of chair without difficulty ; gait sure and steady in hall  Skin: Skin is warm, dry and intact. No abrasion, no bruising, no burn, no ecchymosis, no laceration, no lesion, no petechiae and no rash noted. He is not diaphoretic. No cyanosis or erythema. No pallor. Nails show no clubbing.  Psychiatric: He has a normal mood and  affect. His speech is normal and behavior is normal. Judgment and thought content normal. Cognition and memory are normal.  Nursing note and vitals reviewed.         Assessment & Plan:  A-bilateral otitis media nonsupportive; acute bronchitis and maxillary sinusitis  P-restart flonase 1 spray each nostril at home; nasal saline 2 sprays each nostril q2h wa given 1 bottle from clinic stock; doxycycline 100mg  po BID x 10  days #20 Rf0 dispensed from [facility] follow up if no improvement with plan of care x 48 hours consider albuterol inhaler/steroids  Exitcare handouts on sinusitis, sinus rinse, bronchitis, otitis media  No evidence of systemic bacterial infection, non toxic and well hydrated.  I do not see where any further testing or imaging is necessary at this time.   I will suggest supportive care, rest, good hygiene and encourage the patient to take adequate fluids.  The patient is to return to clinic or EMERGENCY ROOM if symptoms worsen or change significantly.  Patient verbalized agreement and understanding of treatment plan and had no further questions at this time.   P2:  Hand washing and cover cough  Patient may use normal saline nasal spray as needed.  Restart flonase 1 spray each nostril BID and consider antihistamine use.  Avoid triggers if possible.  Shower prior to bedtime if exposed to triggers.  If allergic dust/dust mites recommend mattress/pillow covers/encasements; washing linens, vacuuming, sweeping, dusting weekly.  Call or return to clinic as needed if these symptoms worsen or fail to improve as anticipated.  Patient verbalized understanding of instructions, agreed with plan of care and had no further questions at this time.  P2:  Avoidance and hand washing.  Treatment as ordered.  Symptomatic therapy suggested fluids, NSAIDs and rest.  May take Tylenol or Motrin for fevers.  Call or return to clinic as needed if these symptoms worsen or fail to improve as anticipated. Exitcare handout on otitis media given to patient.  Patient verbalized agreement and understanding of treatment plan.   P2:  Hand washing  Bronchitis simple, community acquired, may have started as viral (probably respiratory syncytial, parainfluenza, influenza, or adenovirus), but now evidence of acute purulent bronchitis with resultant bronchial edema and mucus formation.  Viruses are the most common cause of bronchial inflammation in  otherwise healthy adults with acute bronchitis.  The appearance of sputum is not predictive of whether a bacterial infection is present.  Purulent sputum is most often caused by viral infections.  There are a small portion of those caused by non-viral agents being Mycoplamsa pneumonia.  Microscopic examination or C&S of sputum in the healthy adult with acute bronchitis is generally not helpful (usually negative or normal respiratory flora) other considerations being cough from upper respiratory tract infections, sinusitis or allergic syndromes (mild asthma or viral pneumonia).  Differential Diagnosis:  reactive airway disease (asthma, allergic aspergillosis (eosinophilia), chronic bronchitis, respiratory infection (Sinusitis, Common cold, pneumonia), congestive heart failure, reflux esophagitis, bronchogenic tumor, aspiration syndromes and/or exposure irritants/tobacco smoke.  In this case, there is no evidence of any invasive bacterial illness.  Most likely viral etiology so will hold on antibiotic treatment.  Advise supportive care with rest, encourage fluids, good hygiene and watch for any worsening symptoms.  If they were to develop:  come back to the office or go to the emergency room if after hours. Without high fever, severe dyspnea, lack of physical findings or other risk factors, I will hold on a chest radiograph and CBC at this time.  I discussed that approximately 50% of patients with acute bronchitis have a cough that lasts up to three weeks, and 25% for over a month.  Tylenol, one to two tablets every four hours as needed for fever or myalgias.   No aspirin.  Patient instructed to follow up in one week or sooner if symptoms worsen. Patient verbalized agreement and understanding of treatment plan.  P2:  hand washing and cover cough

## 2016-09-30 DIAGNOSIS — Z0279 Encounter for issue of other medical certificate: Secondary | ICD-10-CM

## 2016-10-10 ENCOUNTER — Ambulatory Visit: Payer: Self-pay | Admitting: Registered Nurse

## 2016-10-10 VITALS — BP 119/66 | HR 76 | Temp 98.3°F

## 2016-10-10 DIAGNOSIS — H1013 Acute atopic conjunctivitis, bilateral: Secondary | ICD-10-CM

## 2016-10-10 DIAGNOSIS — J0101 Acute recurrent maxillary sinusitis: Secondary | ICD-10-CM

## 2016-10-10 DIAGNOSIS — H6591 Unspecified nonsuppurative otitis media, right ear: Secondary | ICD-10-CM

## 2016-10-10 DIAGNOSIS — J309 Allergic rhinitis, unspecified: Secondary | ICD-10-CM

## 2016-10-10 MED ORDER — LORATADINE 10 MG PO TABS: 10.0000 mg | ORAL_TABLET | Freq: Every day | ORAL | 1 refills | Status: DC

## 2016-10-10 MED ORDER — AMOXICILLIN-POT CLAVULANATE 875-125 MG PO TABS: 1.0000 | ORAL_TABLET | Freq: Two times a day (BID) | ORAL | 0 refills | Status: DC

## 2016-10-10 MED ORDER — CARBOXYMETHYLCELLULOSE SODIUM 1 % OP SOLN: 1.0000 [drp] | Freq: Three times a day (TID) | OPHTHALMIC | 12 refills | Status: AC

## 2016-10-10 MED ORDER — KETOTIFEN FUMARATE 0.025 % OP SOLN: 1.0000 [drp] | Freq: Two times a day (BID) | OPHTHALMIC | 0 refills | Status: AC

## 2016-10-10 NOTE — Progress Notes (Signed)
Subjective:    Patient ID: Jacob Ali, male    DOB: 01-31-1960, 56 y.o.   MRN: [id number]  56y/o established caucasian male father died last week a lot of crying and rubbing eyes; has noticed flare in seasonal allergies also; Pt reports R eye irritation x1 week. Foreign body sensation intermittently. Causes eyes to water some. Used Refresh lubricating drops this am with relief of symptoms and feeling better.  Denied visual changes/trauma/headache/nausea/eye discharge.      Review of Systems  Constitutional: Negative for activity change, appetite change, chills, diaphoresis, fatigue, fever and unexpected weight change.  HENT: Positive for congestion, postnasal drip, rhinorrhea, sinus pain, sinus pressure and sore throat. Negative for dental problem, drooling, ear discharge, ear pain, facial swelling, hearing loss, mouth sores, nosebleeds, sneezing, tinnitus, trouble swallowing and voice change.   Eyes: Positive for pain, redness and itching. Negative for photophobia, discharge and visual disturbance.  Respiratory: Positive for chest tightness. Negative for cough, choking, shortness of breath, wheezing and stridor.   Cardiovascular: Negative for chest pain, palpitations and leg swelling.  Gastrointestinal: Negative for abdominal distention, abdominal pain, blood in stool, constipation, diarrhea, nausea and vomiting.  Endocrine: Negative for cold intolerance and heat intolerance.  Genitourinary: Negative for dysuria.  Musculoskeletal: Negative for arthralgias, back pain, gait problem, joint swelling, myalgias, neck pain and neck stiffness.  Skin: Negative for color change, pallor, rash and wound.  Allergic/Immunologic: Positive for environmental allergies. Negative for food allergies and immunocompromised state.  Neurological: Negative for dizziness, tremors, seizures, syncope, facial asymmetry, speech difficulty, weakness, light-headedness, numbness and headaches.  Hematological: Negative  for adenopathy. Does not bruise/bleed easily.  Psychiatric/Behavioral: Negative for agitation, behavioral problems, confusion and sleep disturbance.       Objective:   Physical Exam  Constitutional: He is oriented to person, place, and time. Vital signs are normal. He appears well-developed and well-nourished. He is active and cooperative.  Non-toxic appearance. He does not have a sickly appearance. He appears ill. No distress.  HENT:  Head: Normocephalic and atraumatic.  Right Ear: Hearing, external ear and ear canal normal. Tympanic membrane is injected, erythematous and bulging. A middle ear effusion is present.  Left Ear: Hearing, external ear and ear canal normal. A middle ear effusion is present.  Nose: Mucosal edema and rhinorrhea present. No nose lacerations, sinus tenderness, nasal deformity, septal deviation or nasal septal hematoma. No epistaxis.  No foreign bodies. Right sinus exhibits maxillary sinus tenderness and frontal sinus tenderness. Left sinus exhibits maxillary sinus tenderness and frontal sinus tenderness.  Mouth/Throat: Uvula is midline. Mucous membranes are not pale, dry and not cyanotic. He does not have dentures. No oral lesions. No trismus in the jaw. Normal dentition. No dental abscesses, uvula swelling, lacerations or dental caries. Posterior oropharyngeal edema and posterior oropharyngeal erythema present. No oropharyngeal exudate or tonsillar abscesses.  Maxillary greater than frontal sinuses TTP; bilateral TMs air fluid level clear; right TM injected/erythematous/bulging around perimeter; cobblestoning posterior pharynx; bilateral nasal turbinates edema/erythema clear discharge; bilateral allergic shiners/lower eyelids puffy; white coating tongue and mucous sticky/thick  Eyes: Pupils are equal, round, and reactive to light. EOM and lids are normal. Right eye exhibits no chemosis, no discharge, no exudate and no hordeolum. No foreign body present in the right eye. Left  eye exhibits no chemosis, no discharge, no exudate and no hordeolum. No foreign body present in the left eye. Right conjunctiva is injected. Right conjunctiva has no hemorrhage. Left conjunctiva is injected. Left conjunctiva has no hemorrhage. No scleral  icterus. Right eye exhibits normal extraocular motion and no nystagmus. Left eye exhibits normal extraocular motion and no nystagmus. Right pupil is round and reactive. Left pupil is round and reactive. Pupils are equal.  Fundoscopic exam:      The right eye shows no exudate, no hemorrhage and no papilledema.       The left eye shows no exudate, no hemorrhage and no papilledema.  Slit lamp exam:      The right eye shows corneal abrasion and fluorescein uptake. The right eye shows no corneal flare, no corneal ulcer, no foreign body, no hyphema and no hypopyon.       The left eye shows corneal abrasion. The left eye shows no corneal flare, no corneal ulcer, no foreign body, no hyphema and no hypopyon.    Bilateral upper and lower eyelids with 2+/4 injection mainly at lash line; fluoroscein and woods lamp evaluation showed minimal dye uptake lower eye conjunctiva medial and lateral; vascular excoriation superior and distally  Neck: Trachea normal, normal range of motion and phonation normal. Neck supple. No tracheal tenderness, no spinous process tenderness and no muscular tenderness present. No neck rigidity. No tracheal deviation, no edema, no erythema and normal range of motion present. No thyroid mass and no thyromegaly present.  Cardiovascular: Normal rate, regular rhythm, S1 normal, S2 normal, normal heart sounds and intact distal pulses.  PMI is not displaced.  Exam reveals no gallop and no friction rub.   No murmur heard. Pulses:      Radial pulses are 2+ on the right side, and 2+ on the left side.  Pulmonary/Chest: Effort normal. No accessory muscle usage or stridor. No respiratory distress. He has no decreased breath sounds. He has wheezes in  the right middle field. He has no rhonchi. He has no rales.  Inspiratory RMF wheeze cleared with cough; spoke full sentences without difficulty  Abdominal: Soft. He exhibits no distension.  Musculoskeletal: Normal range of motion. He exhibits no edema or tenderness.       Right shoulder: Normal.       Left shoulder: Normal.       Right elbow: Normal.      Left elbow: Normal.       Right hip: Normal.       Left hip: Normal.       Right knee: Normal.       Left knee: Normal.       Cervical back: Normal.       Thoracic back: Normal.       Lumbar back: Normal.       Right hand: Normal.       Left hand: Normal.  Lymphadenopathy:       Head (right side): No submental, no submandibular, no tonsillar, no preauricular, no posterior auricular and no occipital adenopathy present.       Head (left side): No submental, no submandibular, no tonsillar, no preauricular, no posterior auricular and no occipital adenopathy present.    He has no cervical adenopathy.       Right cervical: No superficial cervical, no deep cervical and no posterior cervical adenopathy present.      Left cervical: No superficial cervical, no deep cervical and no posterior cervical adenopathy present.  Neurological: He is alert and oriented to person, place, and time. He has normal strength. He is not disoriented. He displays no atrophy and no tremor. No cranial nerve deficit or sensory deficit. He exhibits normal muscle tone. He displays no seizure  activity. Coordination and gait normal. GCS eye subscore is 4. GCS verbal subscore is 5. GCS motor subscore is 6.  On/off exam table and in/out of chair without difficulty; gait sure and steady hallway  Skin: Skin is warm, dry and intact. No abrasion, no bruising, no burn, no ecchymosis, no laceration, no lesion, no petechiae and no rash noted. He is not diaphoretic. No cyanosis or erythema. No pallor. Nails show no clubbing.  Psychiatric: He has a normal mood and affect. His speech is  normal and behavior is normal. Judgment and thought content normal. Cognition and memory are normal.  Nursing note and vitals reviewed.         Assessment & Plan:  A-right otitis media nonsupportive; recurrent sinusitis maxillary, allergic conjunctivitis and rhinitis  P-Augmentin 875mg  po BID x 10 days #20 RF0 dispensed from [facility].   Discussed with patient due to protonix use decreased absorption augmentin may require longer duration.  No evidence of invasive bacterial infection, non toxic and well hydrated. This is most likely self limiting viral infection. I do not see where any further testing or imaging is necessary at this time. I will suggest supportive care, rest, good hygiene and encourage the patient to take adequate fluids. The patient is to return to clinic or EMERGENCY ROOM if symptoms worsen or change significantly e.g. ear pain, fever, purulent discharge from ears or bleeding. Exitcare handout on otitis media given to patient. Patient verbalized agreement and understanding of treatment plan and had no further questions at this time.   restart flonase 1 spray each nostril BID #1 RF0 dispensed from [facility] and given 1 bottle nasal saline 2 sprays each nostril q2h wa #1 RF0 from clinic stock.  Electronic Rx claritin 10mg  po daily #30 RF1 patient reported allegra D makes him too sleepy.  Discussed to take at night.  No evidence of systemic bacterial infection, non toxic and well hydrated. I do not see where any further testing or imaging is necessary at this time. I will suggest supportive care, rest, good hygiene and encourage the patient to take adequate fluids. The patient is to return to clinic or EMERGENCY ROOM if symptoms worsen or change significantly. Exitcare handout on sinusitis and sinus rinse given to patient. Patient verbalized agreement and understanding of treatment plan and had no further questions at this time.  P2: Hand washing and cover cough   Cleared for work. Start  antihistamine medication may use claritin 10mg  po daily and/or ketotifen 0.25% one drop each eye BID x 14 days #1 RF1 and refresh gtts ou TID prn eye irritation/dryness #1 RF1 electronic Rx sent to pharmacy of choice patient has flex spending card. Shower after allergen exposure prior to bed. Hygiene discussed. Dispose of current contacts and case. Patient to apply warm packs prn as directed. Instructed patient to not rub eyes. May use over the counter eye drops/tears for pain/symptom relief. Return to clinic if headache, fever greater than 100.79F, nausea/vomiting, purulent discharge/matting unable to open eye without using fingers, foreign body sensation, ciliary flush, worsening photophobia or vision. Call or return to clinic as needed if these symptoms worsen or fail to improve as anticipated. Patient given Exitcare handout on allergic conjunctivitis. Patient verbalized agreement and understanding of treatment plan.  P2: Hand washing, avoid contact use-wear glasses.  Patient may use normal saline nasal spray as needed. Consider antihistamine or nasal steroid use. Avoid triggers if possible. Shower prior to bedtime if exposed to triggers. If allergic dust/dust mites recommend mattress/pillow covers/encasements;  washing linens, vacuuming, sweeping, dusting weekly. Call or return to clinic as needed if these symptoms worsen or fail to improve as anticipated. Exitcare handout on allergic rhinitis given to patient. Patient verbalized understanding of instructions, agreed with plan of care and had no further questions at this time.  P2: Avoidance and hand washing.

## 2016-10-10 NOTE — Patient Instructions (Signed)
Sinus Rinse What is a sinus rinse? A sinus rinse is a simple home treatment that is used to rinse your sinuses with a sterile mixture of salt and water (saline solution). Sinuses are air-filled spaces in your skull behind the bones of your face and forehead that open into your nasal cavity. You will use the following:  Saline solution.  Neti pot or spray bottle. This releases the saline solution into your nose and through your sinuses. Neti pots and spray bottles can be purchased at [employer], a health food store, or online.  When would I do a sinus rinse? A sinus rinse can help to clear mucus, dirt, dust, or pollen from the nasal cavity. You may do a sinus rinse when you have a cold, a virus, nasal allergy symptoms, a sinus infection, or stuffiness in the nose or sinuses. If you are considering a sinus rinse:  Ask your child's health care provider before performing a sinus rinse on your child.  Do not do a sinus rinse if you have had ear or nasal surgery, ear infection, or blocked ears.  How do I do a sinus rinse?  Wash your hands.  Disinfect your device according to the directions provided and then dry it.  Use the solution that comes with your device or one that is sold separately in stores. Follow the mixing directions on the package.  Fill your device with the amount of saline solution as directed by the device instructions.  Stand over a sink and tilt your head sideways over the sink.  Place the spout of the device in your upper nostril (the one closer to the ceiling).  Gently pour or squeeze the saline solution into the nasal cavity. The liquid should drain to the lower nostril if you are not overly congested.  Gently blow your nose. Blowing too hard may cause ear pain.  Repeat in the other nostril.  Clean and rinse your device with clean water and then air-dry it. Are there risks of a sinus rinse? Sinus rinse is generally very safe and effective. However,  there are a few risks, which include:  A burning sensation in the sinuses. This may happen if you do not make the saline solution as directed. Make sure to follow all directions when making the saline solution.  Infection from contaminated water. This is rare, but possible.  Nasal irritation.  This information is not intended to replace advice given to you by your health care provider. Make sure you discuss any questions you have with your health care provider. Document Released: 07/28/2013 Document Revised: 11/28/2015 Document Reviewed: 05/18/2013 Elsevier Interactive Patient Education  2017 [employer] Sinusitis, Adult Sinusitis is soreness and inflammation of your sinuses. Sinuses are hollow spaces in the bones around your face. Your sinuses are located:  Around your eyes.  In the middle of your forehead.  Behind your nose.  In your cheekbones.  Your sinuses and nasal passages are lined with a stringy fluid (mucus). Mucus normally drains out of your sinuses. When your nasal tissues become inflamed or swollen, the mucus can become trapped or blocked so air cannot flow through your sinuses. This allows bacteria, viruses, and funguses to grow, which leads to infection. Sinusitis can develop quickly and last for 7?10 days (acute) or for more than 12 weeks (chronic). Sinusitis often develops after a cold. What are the causes? This condition is caused by anything that creates swelling in the sinuses or stops mucus from draining, including:  Allergies.  Asthma.  Bacterial or viral infection.  Abnormally shaped bones between the nasal passages.  Nasal growths that contain mucus (nasal polyps).  Narrow sinus openings.  Pollutants, such as chemicals or irritants in the air.  A foreign object stuck in the nose.  A fungal infection. This is rare.  What increases the risk? The following factors may make you more likely to develop this condition:  Having allergies or  asthma.  Having had a recent cold or respiratory tract infection.  Having structural deformities or blockages in your nose or sinuses.  Having a weak immune system.  Doing a lot of swimming or diving.  Overusing nasal sprays.  Smoking.  What are the signs or symptoms? The main symptoms of this condition are pain and a feeling of pressure around the affected sinuses. Other symptoms include:  Upper toothache.  Earache.  Headache.  Bad breath.  Decreased sense of smell and taste.  A cough that may get worse at night.  Fatigue.  Fever.  Thick drainage from your nose. The drainage is often green and it may contain pus (purulent).  Stuffy nose or congestion.  Postnasal drip. This is when extra mucus collects in the throat or back of the nose.  Swelling and warmth over the affected sinuses.  Sore throat.  Sensitivity to light.  How is this diagnosed? This condition is diagnosed based on symptoms, a medical history, and a physical exam. To find out if your condition is acute or chronic, your health care provider may:  Look in your nose for signs of nasal polyps.  Tap over the affected sinus to check for signs of infection.  View the inside of your sinuses using an imaging device that has a light attached (endoscope).  If your health care provider suspects that you have chronic sinusitis, you may also:  Be tested for allergies.  Have a sample of mucus taken from your nose (nasal culture) and checked for bacteria.  Have a mucus sample examined to see if your sinusitis is related to an allergy.  If your sinusitis does not respond to treatment and it lasts longer than 8 weeks, you may have an MRI or CT scan to check your sinuses. These scans also help to determine how severe your infection is. In rare cases, a bone biopsy may be done to rule out more serious types of fungal sinus disease. How is this treated? Treatment for sinusitis depends on the cause and  whether your condition is chronic or acute. If a virus is causing your sinusitis, your symptoms will go away on their own within 10 days. You may be given medicines to relieve your symptoms, including:  Topical nasal decongestants. They shrink swollen nasal passages and let mucus drain from your sinuses.  Antihistamines. These drugs block inflammation that is triggered by allergies. This can help to ease swelling in your nose and sinuses.  Topical nasal corticosteroids. These are nasal sprays that ease inflammation and swelling in your nose and sinuses.  Nasal saline washes. These rinses can help to get rid of thick mucus in your nose.  If your condition is caused by bacteria, you will be given an antibiotic medicine. If your condition is caused by a fungus, you will be given an antifungal medicine. Surgery may be needed to correct underlying conditions, such as narrow nasal passages. Surgery may also be needed to remove polyps. Follow these instructions at home: Medicines  Take, use, or apply over-the-counter and prescription medicines only as told by  your health care provider. These may include nasal sprays.  If you were prescribed an antibiotic medicine, take it as told by your health care provider. Do not stop taking the antibiotic even if you start to feel better. Hydrate and Humidify  Drink enough water to keep your urine clear or pale yellow. Staying hydrated will help to thin your mucus.  Use a cool mist humidifier to keep the humidity level in your home above 50%.  Inhale steam for 10-15 minutes, 3-4 times a day or as told by your health care provider. You can do this in the bathroom while a hot shower is running.  Limit your exposure to cool or dry air. Rest  Rest as much as possible.  Sleep with your head raised (elevated).  Make sure to get enough sleep each night. General instructions  Apply a warm, moist washcloth to your face 3-4 times a day or as told by your  health care provider. This will help with discomfort.  Wash your hands often with soap and water to reduce your exposure to viruses and other germs. If soap and water are not available, use hand sanitizer.  Do not smoke. Avoid being around people who are smoking (secondhand smoke).  Keep all follow-up visits as told by your health care provider. This is important. Contact a health care provider if:  You have a fever.  Your symptoms get worse.  Your symptoms do not improve within 10 days. Get help right away if:  You have a severe headache.  You have persistent vomiting.  You have pain or swelling around your face or eyes.  You have vision problems.  You develop confusion.  Your neck is stiff.  You have trouble breathing. This information is not intended to replace advice given to you by your health care provider. Make sure you discuss any questions you have with your health care provider. Document Released: 12/31/2004 Document Revised: 08/27/2015 Document Reviewed: 10/26/2014 Elsevier Interactive Patient Education  2017 [place]. Otitis Media, Adult Otitis media is redness, soreness, and puffiness (swelling) in the space just behind your eardrum (middle ear). It may be caused by allergies or infection. It often happens along with a cold. Follow these instructions at home:  Take your medicine as told. Finish it even if you start to feel better.  Only take over-the-counter or prescription medicines for pain, discomfort, or fever as told by your doctor.  Follow up with your doctor as told. Contact a doctor if:  You have otitis media only in one ear, or bleeding from your nose, or both.  You notice a lump on your neck.  You are not getting better in 3-5 days.  You feel worse instead of better. Get help right away if:  You have pain that is not helped with medicine.  You have puffiness, redness, or pain around your ear.  You get a stiff neck.  You cannot move  part of your face (paralysis).  You notice that the bone behind your ear hurts when you touch it. This information is not intended to replace advice given to you by your health care provider. Make sure you discuss any questions you have with your health care provider. Document Released: 06/19/2007 Document Revised: 06/08/2015 Document Reviewed: 07/28/2012 Elsevier Interactive Patient Education  2017 [place]. Allergic Rhinitis Allergic rhinitis is when the mucous membranes in the nose respond to allergens. Allergens are particles in the air that cause your body to have an allergic reaction. This causes  you to release allergic antibodies. Through a chain of events, these eventually cause you to release histamine into the blood stream. Although meant to protect the body, it is this release of histamine that causes your discomfort, such as frequent sneezing, congestion, and an itchy, runny nose. What are the causes? Seasonal allergic rhinitis (hay fever) is caused by pollen allergens that may come from grasses, trees, and weeds. Year-round allergic rhinitis (perennial allergic rhinitis) is caused by allergens such as house dust mites, pet dander, and mold spores. What are the signs or symptoms?  Nasal stuffiness (congestion).  Itchy, runny nose with sneezing and tearing of the eyes. How is this diagnosed? Your health care provider can help you determine the allergen or allergens that trigger your symptoms. If you and your health care provider are unable to determine the allergen, skin or blood testing may be used. Your health care provider will diagnose your condition after taking your health history and performing a physical exam. Your health care provider may assess you for other related conditions, such as asthma, pink eye, or an ear infection. How is this treated? Allergic rhinitis does not have a cure, but it can be controlled by:  Medicines that block allergy symptoms. These may include  allergy shots, nasal sprays, and oral antihistamines.  Avoiding the allergen.  Hay fever may often be treated with antihistamines in pill or nasal spray forms. Antihistamines block the effects of histamine. There are over-the-counter medicines that may help with nasal congestion and swelling around the eyes. Check with your health care provider before taking or giving this medicine. If avoiding the allergen or the medicine prescribed do not work, there are many new medicines your health care provider can prescribe. Stronger medicine may be used if initial measures are ineffective. Desensitizing injections can be used if medicine and avoidance does not work. Desensitization is when a patient is given ongoing shots until the body becomes less sensitive to the allergen. Make sure you follow up with your health care provider if problems continue. Follow these instructions at home: It is not possible to completely avoid allergens, but you can reduce your symptoms by taking steps to limit your exposure to them. It helps to know exactly what you are allergic to so that you can avoid your specific triggers. Contact a health care provider if:  You have a fever.  You develop a cough that does not stop easily (persistent).  You have shortness of breath.  You start wheezing.  Symptoms interfere with normal daily activities. This information is not intended to replace advice given to you by your health care provider. Make sure you discuss any questions you have with your health care provider. Document Released: 09/25/2000 Document Revised: 09/01/2015 Document Reviewed: 09/07/2012 Elsevier Interactive Patient Education  2017 [employer] Inc. Allergic Conjunctivitis, Adult Allergic conjunctivitis is inflammation of the clear membrane that covers the white part of your eye and the inner surface of your eyelid (conjunctiva). The inflammation is caused by allergies. The blood vessels in the conjunctiva become  inflamed and this causes the eyes to become red or pink. The eyes often feel itchy. Allergic conjunctivitis cannot be spread from one person to another person (is not contagious). What are the causes? This condition is caused by an allergic reaction. Common causes of an allergic reaction (allergens) include:  Outdoor allergens, such as: ? Pollen. ? Grass and weeds. ? Mold spores.  Indoor allergens, such as: ? Dust. ? Smoke. ? Mold. ? Pet dander. ?  Animal hair.  What increases the risk? You may be more likely to develop this condition if you have a family history of allergies, such as:  Allergic rhinitis.  Bronchial asthma.  Atopic dermatitis.  What are the signs or symptoms? Symptoms of this condition include eyes that are:  Itchy.  Red.  Watery.  Puffy.  Your eyes may also:  Sting or burn.  Have clear drainage coming from them.  How is this diagnosed? This condition may be diagnosed by medical history and physical exam. If you have drainage from your eyes, it may be tested to rule out other causes of conjunctivitis. You may also need to see a health care provider who specializes in treating allergies (allergist) or eye conditions (ophthalmologist) for tests to confirm the diagnosis. You may have:  Skin tests to see which allergens are causing your symptoms. These tests involve pricking the skin with a tiny needle and exposing the skin to small amounts of potential allergens to see if your skin reacts.  Blood tests.  Tissue scrapings from your eyelid. These will be examined under a microscope.  How is this treated? Treatments for this condition may include:  Cold cloths (compresses) to soothe itching and swelling.  Washing the face to remove allergens.  Eye drops. These may be prescription or over-the-counter. There are several different types. You may need to try different types to see which one works best for you. Your may need: ? Eye drops that block the  allergic reaction (antihistamine). ? Eye drops that reduce swelling and irritation (anti-inflammatory). ? Steroid eye drops to lessen a severe reaction (vernal conjunctivitis).  Oral antihistamine medicines to reduce your allergic reaction. You may need these if eye drops do not help or are difficult to use.  Follow these instructions at home:  Avoid known allergens whenever possible.  Take or apply over-the-counter and prescription medicines only as told by your health care provider. These include any eye drops.  Apply a cool, clean washcloth to your eye for 10-20 minutes, 3-4 times a day.  Do not touch or rub your eyes.  Do not wear contact lenses until the inflammation is gone. Wear glasses instead.  Do not wear eye makeup until the inflammation is gone.  Keep all follow-up visits as told by your health care provider. This is important. Contact a health care provider if:  Your symptoms get worse or do not improve with treatment.  You have mild eye pain.  You have sensitivity to light.  You have spots or blisters on your eyes.  You have pus draining from your eye.  You have a fever. Get help right away if:  You have redness, swelling, or other symptoms in only one eye.  Your vision is blurred or you have vision changes.  You have severe eye pain. This information is not intended to replace advice given to you by your health care provider. Make sure you discuss any questions you have with your health care provider. Document Released: 03/23/2002 Document Revised: 08/30/2015 Document Reviewed: 07/14/2015 Elsevier Interactive Patient Education  2018 [employer].

## 2016-10-23 ENCOUNTER — Ambulatory Visit: Payer: Self-pay | Admitting: Infectious Diseases

## 2016-11-27 ENCOUNTER — Encounter: Payer: Self-pay | Admitting: Infectious Diseases

## 2016-11-27 ENCOUNTER — Ambulatory Visit (INDEPENDENT_AMBULATORY_CARE_PROVIDER_SITE_OTHER): Payer: No Typology Code available for payment source | Admitting: Infectious Diseases

## 2016-11-27 VITALS — BP 156/81 | HR 99 | Temp 97.4°F | Wt 189.4 lb

## 2016-11-27 DIAGNOSIS — F17200 Nicotine dependence, unspecified, uncomplicated: Secondary | ICD-10-CM | POA: Diagnosis not present

## 2016-11-27 DIAGNOSIS — T1491XA Suicide attempt, initial encounter: Secondary | ICD-10-CM

## 2016-11-27 DIAGNOSIS — Z79899 Other long term (current) drug therapy: Secondary | ICD-10-CM

## 2016-11-27 DIAGNOSIS — I70219 Atherosclerosis of native arteries of extremities with intermittent claudication, unspecified extremity: Secondary | ICD-10-CM

## 2016-11-27 DIAGNOSIS — B20 Human immunodeficiency virus [HIV] disease: Secondary | ICD-10-CM

## 2016-11-27 DIAGNOSIS — N529 Male erectile dysfunction, unspecified: Secondary | ICD-10-CM

## 2016-11-27 DIAGNOSIS — Z113 Encounter for screening for infections with a predominantly sexual mode of transmission: Secondary | ICD-10-CM | POA: Diagnosis not present

## 2016-11-27 DIAGNOSIS — E119 Type 2 diabetes mellitus without complications: Secondary | ICD-10-CM

## 2016-11-27 MED ORDER — BICTEGRAVIR-EMTRICITAB-TENOFOV 50-200-25 MG PO TABS: 1.0000 | ORAL_TABLET | Freq: Every day | ORAL | 3 refills | Status: DC

## 2016-11-27 MED ORDER — TADALAFIL 5 MG PO TABS: 5.0000 mg | ORAL_TABLET | Freq: Every day | ORAL | 1 refills | Status: DC | PRN

## 2016-11-27 NOTE — Assessment & Plan Note (Signed)
His repeat A1C is normal.  He continues to work on wt loss.

## 2016-11-27 NOTE — Assessment & Plan Note (Signed)
Encouraged to quit. 

## 2016-11-27 NOTE — Progress Notes (Signed)
   Subjective:    Patient ID: Jacob Ali, male    DOB: 09/06/1960, 56 y.o.   MRN: [id number]  HPI 56 y.o. Male diagnosed with HIV November 05, 2010. Hx of HTN, hyperlipidemia, hospitalization for tylenol overdose and cutting of wrists on 02/20/11. Patient reports adherence to Gemfibrozil, lisinopril/HCTZ. He underwent atherectomy (01-2016) L SFA, PTCA and stent of L SFA and popliteal arteries; and PTCA and stent of R common iliac.  Has been doing well- his father died in 11/05/2022 and he has been down about that.  Currently taking tivicay-descovy.   HIV 1 RNA Quant (copies/mL)  Date Value  07/23/2016 <20 NOT DETECTED  03/11/2016 <20 DETECTED (A)  11/24/2015 26 (H)   CD4 (no units)  Date Value  11/24/2015 1,130  05/17/2015 1,550  09/30/2014 952   CD4 T Cell Abs (/uL)  Date Value  03/11/2016 1,030    Review of Systems  Constitutional: Negative for appetite change, chills, fever and unexpected weight change.  Respiratory: Negative for shortness of breath.   Gastrointestinal: Positive for constipation. Negative for diarrhea.  Genitourinary: Negative for difficulty urinating.  Musculoskeletal: Negative for myalgias.  Skin: Negative for wound.  Hematological: Bruises/bleeds easily.  Psychiatric/Behavioral: Negative for dysphoric mood.  has been calm, sad.  Please see HPI. All other systems reviewed and negative.     Objective:   Physical Exam  Constitutional: He appears well-developed and well-nourished.  HENT:  Mouth/Throat: No oropharyngeal exudate.  Eyes: EOM are normal. Pupils are equal, round, and reactive to light.  Neck: Neck supple.  Cardiovascular: Normal rate, regular rhythm and normal heart sounds.  Pulmonary/Chest: Effort normal and breath sounds normal.  Abdominal: Soft. Bowel sounds are normal. There is no tenderness. There is no rebound.  Musculoskeletal: He exhibits no edema or tenderness.  Lymphadenopathy:    He has no cervical adenopathy.  Psychiatric: He  has a normal mood and affect.      Assessment & Plan:

## 2016-11-27 NOTE — Assessment & Plan Note (Addendum)
Will refill his cialis Asked to call if erection lasts longer than 6 h Given condoms

## 2016-11-27 NOTE — Assessment & Plan Note (Addendum)
He is doing well Will change him to biktarvy.  Flu vax at work.  Given condoms rtc in 6 months

## 2016-11-27 NOTE — Addendum Note (Signed)
Addended by: Talibah Colasurdo C on: 11/27/2016 04:42 PM   Modules accepted: Orders

## 2016-11-27 NOTE — Assessment & Plan Note (Signed)
He is doing very well He continues on plavix (has stent) Will f/u with vascular.

## 2016-11-27 NOTE — Assessment & Plan Note (Signed)
No further SI

## 2016-11-28 ENCOUNTER — Telehealth: Payer: Self-pay | Admitting: Pharmacist Clinician (PhC)/ Clinical Pharmacy Specialist

## 2016-11-28 LAB — HEMOGLOBIN A1C
Hgb A1c MFr Bld: 4 % of total Hgb (ref <5.7)
Mean Plasma Glucose: 68 (calc)
eAG (mmol/L): 3.8 (calc)

## 2016-11-28 NOTE — Telephone Encounter (Signed)
Just letting Jacob Ali knows that the pharmacy will mail out his [place] and Cialis.

## 2016-11-29 LAB — T-HELPER CELL (CD4) - (RCID CLINIC ONLY)
CD4 % Helper T Cell: 35 % (ref 33–55)
CD4 T Cell Abs: 1780 /uL (ref 400–2700)

## 2016-11-29 LAB — HIV-1 RNA QUANT-NO REFLEX-BLD
HIV 1 RNA Quant: <20 copies/mL
HIV-1 RNA Quant, Log: <1.3 Log copies/mL

## 2016-12-10 ENCOUNTER — Other Ambulatory Visit (INDEPENDENT_AMBULATORY_CARE_PROVIDER_SITE_OTHER): Payer: Self-pay | Admitting: Vascular Surgery

## 2017-01-04 ENCOUNTER — Other Ambulatory Visit: Payer: Self-pay | Admitting: Infectious Diseases

## 2017-01-29 ENCOUNTER — Other Ambulatory Visit (INDEPENDENT_AMBULATORY_CARE_PROVIDER_SITE_OTHER): Payer: Self-pay | Admitting: Vascular Surgery

## 2017-01-29 DIAGNOSIS — I739 Peripheral vascular disease, unspecified: Secondary | ICD-10-CM

## 2017-01-30 ENCOUNTER — Ambulatory Visit (INDEPENDENT_AMBULATORY_CARE_PROVIDER_SITE_OTHER): Payer: No Typology Code available for payment source

## 2017-01-30 ENCOUNTER — Encounter (INDEPENDENT_AMBULATORY_CARE_PROVIDER_SITE_OTHER): Payer: Self-pay | Admitting: Vascular Surgery

## 2017-01-30 ENCOUNTER — Ambulatory Visit (INDEPENDENT_AMBULATORY_CARE_PROVIDER_SITE_OTHER): Payer: No Typology Code available for payment source | Admitting: Vascular Surgery

## 2017-01-30 VITALS — BP 139/80 | HR 91 | Resp 14 | Ht 69.0 in | Wt 190.0 lb

## 2017-01-30 DIAGNOSIS — I10 Essential (primary) hypertension: Secondary | ICD-10-CM | POA: Diagnosis not present

## 2017-01-30 DIAGNOSIS — E119 Type 2 diabetes mellitus without complications: Secondary | ICD-10-CM | POA: Diagnosis not present

## 2017-01-30 DIAGNOSIS — I739 Peripheral vascular disease, unspecified: Secondary | ICD-10-CM

## 2017-01-30 DIAGNOSIS — I70219 Atherosclerosis of native arteries of extremities with intermittent claudication, unspecified extremity: Secondary | ICD-10-CM | POA: Diagnosis not present

## 2017-01-30 DIAGNOSIS — K219 Gastro-esophageal reflux disease without esophagitis: Secondary | ICD-10-CM | POA: Diagnosis not present

## 2017-02-01 ENCOUNTER — Encounter (INDEPENDENT_AMBULATORY_CARE_PROVIDER_SITE_OTHER): Payer: Self-pay | Admitting: Vascular Surgery

## 2017-02-01 NOTE — Progress Notes (Signed)
MRN : [id number]  Jacob Ali is a 57 y.o. ([birth year]) male who presents with chief complaint of  Chief Complaint  Patient presents with  . Follow-up    6 month f/u ultrasound  .  History of Present Illness:The patient returns to the office for followup and review of the noninvasive studies. There has been a significant deterioration in the lower extremity symptoms.  The patient notes interval shortening of their claudication distance and development of mild rest pain symptoms. No new ulcers or wounds have occurred since the last visit.  There have been no significant changes to the patient's overall health care.  The patient denies amaurosis fugax or recent TIA symptoms. There are no recent neurological changes noted. The patient denies history of DVT, PE or superficial thrombophlebitis. The patient denies recent episodes of angina or shortness of breath.    Current Meds  Medication Sig  . aspirin (ASPIRIN CHILDRENS) 81 MG chewable tablet Chew 1 tablet (81 mg total) by mouth daily. Take with food  . ASTAXANTHIN PO Take 12 mg by mouth daily.  . bictegravir-emtricitabine-tenofovir AF (BIKTARVY) 50-200-25 MG TABS tablet Take 1 tablet daily by mouth.  . clopidogrel (PLAVIX) 75 MG tablet TAKE 1 TABLET BY MOUTH EVERY DAY  . Digestive Enzymes (PAPAYA ENZYME) TABS Take by mouth.  Marland Kitchen gemfibrozil (LOPID) 600 MG tablet Take 1 tablet (600 mg total) by mouth daily.  Marland Kitchen ibuprofen (ADVIL,MOTRIN) 800 MG tablet Take 1 tablet (800 mg total) by mouth daily as needed for moderate pain.  Marland Kitchen lisinopril-hydrochlorothiazide (PRINZIDE,ZESTORETIC) 20-25 MG tablet Take 1 tablet by mouth daily.  . pantoprazole (PROTONIX) 40 MG tablet Take 1 tablet (40 mg total) by mouth daily.  . tadalafil (CIALIS) 5 MG tablet Take 1 tablet (5 mg total) daily as needed by mouth for erectile dysfunction.  . [DISCONTINUED] lisinopril (ZESTRIL) 20 MG tablet Take by mouth.    Past Medical History:  Diagnosis Date  .  Depression   . GERD (gastroesophageal reflux disease)   . HIV infection ([place])   . Hyperlipidemia   . Hypertension   . Peripheral vascular disease ([place])   . PONV (postoperative nausea and vomiting)   . Sleep apnea     Past Surgical History:  Procedure Laterality Date  . Examination under anesthesia, repair of anal fissure,  12/04/2000  . PERIPHERAL VASCULAR CATHETERIZATION Left 02/06/2016   Procedure: Lower Extremity Angiography;  Surgeon: Katha Cabal, MD;  Location: [facility];  Service: Cardiovascular;  Laterality: Left;  . PERIPHERAL VASCULAR CATHETERIZATION N/A 02/06/2016   Procedure: Abdominal Aortogram w/Lower Extremity;  Surgeon: Katha Cabal, MD;  Location: [facility];  Service: Cardiovascular;  Laterality: N/A;  . PERIPHERAL VASCULAR CATHETERIZATION  02/06/2016   Procedure: Lower Extremity Intervention;  Surgeon: Katha Cabal, MD;  Location: [facility];  Service: Cardiovascular;;    Social History Social History   Tobacco Use  . Smoking status: Current Every Day Smoker    Packs/day: 0.50    Years: 40.00    Pack years: 20.00    Types: Cigars  . Smokeless tobacco: Never Used  . Tobacco comment: 1/2PPD  Substance Use Topics  . Alcohol use: Yes    Alcohol/week: 1.8 oz    Types: 3 Standard drinks or equivalent per week    Comment: 2-3 times a week  . Drug use: Yes    Frequency: 2.0 times per week    Types: Marijuana    Family History Family  History  Problem Relation Age of Onset  . Throat cancer Father   . Heart disease Father   . Cancer Father        bladder  . Cancer Maternal Grandmother        colon    Allergies  Allergen Reactions  . Tamiflu [Oseltamivir Phosphate] Other (See Comments)    Depressive symptoms     REVIEW OF SYSTEMS (Negative unless checked)  Constitutional: [] Weight loss  [] Fever  [] Chills Cardiac: [] Chest pain   [] Chest pressure   [] Palpitations   [] Shortness of breath when laying flat    [] Shortness of breath with exertion. Vascular:  [x] Pain in legs with walking   [] Pain in legs at rest  [] History of DVT   [] Phlebitis   [x] Swelling in legs   [] Varicose veins   [] Non-healing ulcers Pulmonary:   [] Uses home oxygen   [] Productive cough   [] Hemoptysis   [] Wheeze  [] COPD   [] Asthma Neurologic:  [] Dizziness   [] Seizures   [] History of stroke   [] History of TIA  [] Aphasia   [] Vissual changes   [] Weakness or numbness in arm   [] Weakness or numbness in leg Musculoskeletal:   [] Joint swelling   [] Joint pain   [] Low back pain Hematologic:  [] Easy bruising  [] Easy bleeding   [] Hypercoagulable state   [] Anemic Gastrointestinal:  [] Diarrhea   [] Vomiting  [] Gastroesophageal reflux/heartburn   [] Difficulty swallowing. Genitourinary:  [] Chronic kidney disease   [] Difficult urination  [] Frequent urination   [] Blood in urine Skin:  [] Rashes   [] Ulcers  Psychological:  [] History of anxiety   []  History of major depression.  Physical Examination  Vitals:   01/30/17 1525  BP: 139/80  Pulse: 91  Resp: 14  Weight: 190 lb (86.2 kg)  Height: 5\' 9"  (1.753 m)   Body mass index is 28.06 kg/m. Gen: WD/WN, NAD Head: [place]/AT, No temporalis wasting.  Ear/Nose/Throat: Hearing grossly intact, nares w/o erythema or drainage Eyes: PER, EOMI, sclera nonicteric.  Neck: Supple, no large masses.   Pulmonary:  Good air movement, no audible wheezing bilaterally, no use of accessory muscles.  Cardiac: RRR, no JVD Vascular:  Vessel Right Left  Radial Palpable Palpable  Ulnar Palpable Palpable  Brachial Palpable Palpable  PT Trace Palpable Trace Palpable  DP Trace Palpable Trace Palpable  Gastrointestinal: Non-distended. No guarding/no peritoneal signs.  Musculoskeletal: M/S 5/5 throughout.  No deformity or atrophy.  Neurologic: CN 2-12 intact. Symmetrical.  Speech is fluent. Motor exam as listed above. Psychiatric: Judgment intact, Mood & affect appropriate for pt's clinical situation. Dermatologic: No  rashes or ulcers noted.  No changes consistent with cellulitis. Lymph : No lichenification or skin changes of chronic lymphedema.  CBC Lab Results  Component Value Date   WBC 8.9 06/06/2016   HGB 13.7 06/06/2016   HCT 40.7 06/06/2016   MCV 99 (H) 06/06/2016   PLT 300 06/06/2016    BMET    Component Value Date/Time   NA 136 07/23/2016 1605   NA 132 (L) 06/06/2016 0941   K 3.9 07/23/2016 1605   CL 103 07/23/2016 1605   CO2 25 07/23/2016 1605   GLUCOSE 81 07/23/2016 1605   BUN 14 07/23/2016 1605   BUN 16 06/06/2016 0941   CREATININE 1.09 07/23/2016 1605   CALCIUM 9.8 07/23/2016 1605   GFRNONAA 82 06/06/2016 0941   GFRNONAA >60 10/16/2010 1641   GFRAA 95 06/06/2016 0941   GFRAA >60 10/16/2010 1641   CrCl cannot be calculated (Patient's most recent lab result is  older than the maximum 21 days allowed.).  COAG Lab Results  Component Value Date   INR 1.20 02/23/2011   INR 1.27 02/23/2011   INR 1.22 02/22/2011    Radiology No results found.  Assessment/Plan 1. Atherosclerosis with claudication of extremity ([place])  Recommend:  The patient has evidence of atherosclerosis of the lower extremities with claudication.  The patient does not voice lifestyle limiting changes at this point in time.  Noninvasive studies do not suggest clinically significant change.  No invasive studies, angiography or surgery at this time The patient should continue walking and begin a more formal exercise program.  The patient should continue antiplatelet therapy and aggressive treatment of the lipid abnormalities  No changes in the patient's medications at this time  The patient should continue wearing graduated compression socks 10-15 mmHg strength to control the mild edema.   - VAS [place] LOWER EXTREMITY ARTERIAL DUPLEX; Future - VAS [place] ABI WITH/WO TBI; Future  2. Essential hypertension Continue antihypertensive medications as already ordered, these medications have been reviewed and there  are no changes at this time.   3. Gastroesophageal reflux disease without esophagitis Continue PPI as already ordered, these medications have been reviewed and there are no changes at this time.   4. Type 2 diabetes mellitus without complication, without long-term current use of insulin (HCC) Continue hypoglycemic medications as already ordered, these medications have been reviewed and there are no changes at this time.  Hgb A1C to be monitored as already arranged by primary service     Hortencia Pilar, MD  02/01/2017 4:58 PM

## 2017-02-04 ENCOUNTER — Ambulatory Visit (INDEPENDENT_AMBULATORY_CARE_PROVIDER_SITE_OTHER): Payer: No Typology Code available for payment source | Admitting: Infectious Diseases

## 2017-02-04 ENCOUNTER — Encounter: Payer: Self-pay | Admitting: Infectious Diseases

## 2017-02-04 VITALS — BP 150/74 | HR 94 | Temp 97.8°F | Wt 187.0 lb

## 2017-02-04 DIAGNOSIS — B20 Human immunodeficiency virus [HIV] disease: Secondary | ICD-10-CM

## 2017-02-04 DIAGNOSIS — F17200 Nicotine dependence, unspecified, uncomplicated: Secondary | ICD-10-CM | POA: Diagnosis not present

## 2017-02-04 DIAGNOSIS — Z113 Encounter for screening for infections with a predominantly sexual mode of transmission: Secondary | ICD-10-CM

## 2017-02-04 DIAGNOSIS — I10 Essential (primary) hypertension: Secondary | ICD-10-CM | POA: Diagnosis not present

## 2017-02-04 DIAGNOSIS — E782 Mixed hyperlipidemia: Secondary | ICD-10-CM

## 2017-02-04 DIAGNOSIS — M1991 Primary osteoarthritis, unspecified site: Secondary | ICD-10-CM | POA: Diagnosis not present

## 2017-02-04 DIAGNOSIS — I70219 Atherosclerosis of native arteries of extremities with intermittent claudication, unspecified extremity: Secondary | ICD-10-CM

## 2017-02-04 DIAGNOSIS — E119 Type 2 diabetes mellitus without complications: Secondary | ICD-10-CM | POA: Diagnosis not present

## 2017-02-04 MED ORDER — GEMFIBROZIL 600 MG PO TABS: 600.0000 mg | ORAL_TABLET | Freq: Every day | ORAL | 2 refills | Status: DC

## 2017-02-04 MED ORDER — LISINOPRIL-HYDROCHLOROTHIAZIDE 20-25 MG PO TABS: 1.0000 | ORAL_TABLET | Freq: Every day | ORAL | 3 refills | Status: DC

## 2017-02-04 MED ORDER — IBUPROFEN 800 MG PO TABS: 800.0000 mg | ORAL_TABLET | Freq: Every day | ORAL | 2 refills | Status: DC | PRN

## 2017-02-04 NOTE — Assessment & Plan Note (Signed)
His A1C was 4 at last visit.  Will remove this from his problem list.

## 2017-02-04 NOTE — Assessment & Plan Note (Signed)
Encouraged him to quit smoking.  

## 2017-02-04 NOTE — Assessment & Plan Note (Signed)
Continue on lopid.

## 2017-02-04 NOTE — Assessment & Plan Note (Addendum)
He is doing well.  Will update his pnvx.  Offered/refused condoms.  Has gotten flu shot.  Needs colon (prev had polyps), needs appt.  rtc in 9 months.

## 2017-02-04 NOTE — Assessment & Plan Note (Signed)
His BP is elevated today, he did not take rx this AM.  He will f/u with PCP as well.

## 2017-02-04 NOTE — Progress Notes (Signed)
   Subjective:    Patient ID: Jacob Ali, male    DOB: [birth year], 59 y.o.   MRN: [id number]  HPI 48y.oJerilynn Ali HIV+ September 2012. Hx of HTN, hyperlipidemia, hospitalization for tylenol overdose and cutting of wrists on 02/20/11. Patient reports adherence to Gemfibrozil, lisinopril/HCTZ. He underwent atherectomy (01-2016) L SFA, PTCA and stent of L SFA and popliteal arteries; and PTCA and stent of R common iliac.  Has had f/u at vascular, has some question of irregular flow through his stent.   Prev taking tivicay-descovy. Changed to biktarvy 11-2016.   Has been feeling well. Has occas episodes of fatigue which are disabling.   HIV 1 RNA Quant (copies/mL)  Date Value  11/27/2016 <20 NOT DETECTED  07/23/2016 <20 NOT DETECTED  03/11/2016 <20 DETECTED (A)   CD4 (no units)  Date Value  11/24/2015 1,130  05/17/2015 1,550  09/30/2014 952   CD4 T Cell Abs (/uL)  Date Value  11/27/2016 1,780  03/11/2016 1,030    Review of Systems  Constitutional: Positive for fatigue. Negative for appetite change, chills, fever and unexpected weight change.  Gastrointestinal: Negative for diarrhea and nausea.  Genitourinary: Negative for difficulty urinating.  Psychiatric/Behavioral: Negative for sleep disturbance.  Please see HPI. All other systems reviewed and negative.      Objective:   Physical Exam  Constitutional: He appears well-developed and well-nourished.  HENT:  Mouth/Throat: No oropharyngeal exudate.  Eyes: EOM are normal. Pupils are equal, round, and reactive to light.  Neck: Neck supple.  Cardiovascular: Normal rate, regular rhythm and normal heart sounds.  Pulmonary/Chest: Effort normal and breath sounds normal.  Abdominal: Soft. Bowel sounds are normal. There is no tenderness. There is no rebound.  Musculoskeletal: He exhibits no edema.  Lymphadenopathy:    He has no cervical adenopathy.  Psychiatric: He has a normal mood and affect.      Assessment & Plan:

## 2017-02-04 NOTE — Assessment & Plan Note (Signed)
He has f/u with vascular.  Continues on plavix and asa.  He is encouraged to quit smoking.

## 2017-02-05 LAB — CBC
HCT: 38.7 % (ref 38.5–50.0)
Hemoglobin: 13.7 g/dL (ref 13.2–17.1)
MCH: 34.7 pg — ABNORMAL HIGH (ref 27.0–33.0)
MCHC: 35.4 g/dL (ref 32.0–36.0)
MCV: 98 fL (ref 80.0–100.0)
MPV: 10.8 fL (ref 7.5–12.5)
Platelets: 305 10*3/uL (ref 140–400)
RBC: 3.95 10*6/uL — ABNORMAL LOW (ref 4.20–5.80)
RDW: 11.7 % (ref 11.0–15.0)
WBC: 11.4 10*3/uL — ABNORMAL HIGH (ref 3.8–10.8)

## 2017-02-05 LAB — COMPREHENSIVE METABOLIC PANEL
AG Ratio: 1.9 (calc) (ref 1.0–2.5)
ALT: 62 U/L — ABNORMAL HIGH (ref 9–46)
AST: 40 U/L — ABNORMAL HIGH (ref 10–35)
Albumin: 4.7 g/dL (ref 3.6–5.1)
Alkaline phosphatase (APISO): 52 U/L (ref 40–115)
BUN: 15 mg/dL (ref 7–25)
CO2: 25 mmol/L (ref 20–32)
Calcium: 9.7 mg/dL (ref 8.6–10.3)
Chloride: 103 mmol/L (ref 98–110)
Creat: 1.21 mg/dL (ref 0.70–1.33)
Globulin: 2.5 g/dL (calc) (ref 1.9–3.7)
Glucose, Bld: 133 mg/dL — ABNORMAL HIGH (ref 65–99)
Potassium: 4.1 mmol/L (ref 3.5–5.3)
Sodium: 137 mmol/L (ref 135–146)
Total Bilirubin: 0.5 mg/dL (ref 0.2–1.2)
Total Protein: 7.2 g/dL (ref 6.1–8.1)

## 2017-02-05 LAB — LIPID PANEL
Cholesterol: 178 mg/dL (ref <200)
HDL: 23 mg/dL — ABNORMAL LOW (ref >40)
Non-HDL Cholesterol (Calc): 155 mg/dL (calc) — ABNORMAL HIGH (ref <130)
Total CHOL/HDL Ratio: 7.7 (calc) — ABNORMAL HIGH (ref <5.0)
Triglycerides: 409 mg/dL — ABNORMAL HIGH (ref <150)

## 2017-02-05 LAB — RPR: RPR Ser Ql: NONREACTIVE

## 2017-02-06 LAB — T-HELPER CELL (CD4) - (RCID CLINIC ONLY)
CD4 % Helper T Cell: 35 % (ref 33–55)
CD4 T Cell Abs: 1380 /uL (ref 400–2700)

## 2017-02-06 LAB — HIV-1 RNA QUANT-NO REFLEX-BLD
HIV 1 RNA Quant: <20 copies/mL
HIV-1 RNA Quant, Log: <1.3 Log copies/mL

## 2017-02-09 ENCOUNTER — Other Ambulatory Visit (INDEPENDENT_AMBULATORY_CARE_PROVIDER_SITE_OTHER): Payer: Self-pay | Admitting: Vascular Surgery

## 2017-02-27 ENCOUNTER — Ambulatory Visit: Payer: Self-pay | Admitting: Registered Nurse

## 2017-02-27 VITALS — BP 149/75 | HR 86 | Temp 98.5°F

## 2017-02-27 DIAGNOSIS — H6691 Otitis media, unspecified, right ear: Secondary | ICD-10-CM

## 2017-02-27 DIAGNOSIS — J0101 Acute recurrent maxillary sinusitis: Secondary | ICD-10-CM

## 2017-02-27 DIAGNOSIS — A084 Viral intestinal infection, unspecified: Secondary | ICD-10-CM

## 2017-02-27 MED ORDER — AMOXICILLIN-POT CLAVULANATE 875-125 MG PO TABS: 1.0000 | ORAL_TABLET | Freq: Two times a day (BID) | ORAL | 0 refills | Status: AC

## 2017-02-27 MED ORDER — FLUTICASONE PROPIONATE 50 MCG/ACT NA SUSP: 1.0000 | Freq: Two times a day (BID) | NASAL | 6 refills | Status: DC

## 2017-02-27 MED ORDER — LORATADINE 10 MG PO TABS
10.0000 mg | ORAL_TABLET | Freq: Every day | ORAL | 3 refills | Status: DC
Start: 2017-02-27 — End: 2017-11-25

## 2017-02-27 MED ORDER — SALINE SPRAY 0.65 % NA SOLN: 2.0000 | NASAL | 0 refills | Status: DC

## 2017-02-27 NOTE — Progress Notes (Signed)
Subjective:    Patient ID: Jacob Ali, male    DOB: 05-18-1960, 57 y.o.   MRN: [id number]  56y/o caucasian male established Pt c/o PND, productive cough, chest congestion, maxillary sinus pressure, fatigue, body aches x3 days. Denies fever. Taking Nyquil, Iburpofen at home. Sx improved over past day. Also with diarrhea starting this morning, lower abd pain, 4 episodes. No n/v. Denies blood in stool.       Review of Systems  Constitutional: Positive for fatigue. Negative for activity change, appetite change, chills, diaphoresis, fever and unexpected weight change.  HENT: Positive for congestion, postnasal drip, rhinorrhea, sinus pressure and sinus pain. Negative for dental problem, drooling, ear discharge, ear pain, facial swelling, hearing loss, mouth sores, nosebleeds, sneezing, sore throat, tinnitus, trouble swallowing and voice change.   Eyes: Negative for photophobia, pain, discharge, redness, itching and visual disturbance.  Respiratory: Positive for cough. Negative for choking, chest tightness, shortness of breath, wheezing and stridor.   Cardiovascular: Negative for chest pain, palpitations and leg swelling.  Gastrointestinal: Positive for abdominal pain and diarrhea. Negative for abdominal distention, blood in stool, constipation, nausea and vomiting.  Endocrine: Negative for cold intolerance and heat intolerance.  Genitourinary: Negative for dysuria.  Musculoskeletal: Positive for myalgias. Negative for arthralgias, back pain, gait problem, joint swelling, neck pain and neck stiffness.  Skin: Negative for color change, pallor, rash and wound.  Allergic/Immunologic: Positive for environmental allergies. Negative for food allergies and immunocompromised state.  Neurological: Positive for headaches. Negative for dizziness, tremors, seizures, syncope, facial asymmetry, speech difficulty, weakness, light-headedness and numbness.  Hematological: Negative for adenopathy. Does not  bruise/bleed easily.  Psychiatric/Behavioral: Positive for sleep disturbance. Negative for agitation and confusion.       Objective:   Physical Exam  Constitutional: He is oriented to person, place, and time. Vital signs are normal. He appears well-developed and well-nourished. He is active and cooperative.  Non-toxic appearance. He does not have a sickly appearance. He appears ill. No distress.  HENT:  Head: Normocephalic and atraumatic.  Right Ear: Hearing, external ear and ear canal normal. Tympanic membrane is erythematous and bulging. A middle ear effusion is present.  Left Ear: Hearing, external ear and ear canal normal. A middle ear effusion is present.  Nose: Mucosal edema and rhinorrhea present. No nose lacerations, sinus tenderness, nasal deformity, septal deviation or nasal septal hematoma. No epistaxis.  No foreign bodies. Right sinus exhibits maxillary sinus tenderness. Right sinus exhibits no frontal sinus tenderness. Left sinus exhibits maxillary sinus tenderness. Left sinus exhibits no frontal sinus tenderness.  Mouth/Throat: Uvula is midline and mucous membranes are normal. Mucous membranes are not pale, not dry and not cyanotic. He does not have dentures. No oral lesions. No trismus in the jaw. Normal dentition. No dental abscesses, uvula swelling, lacerations or dental caries. Posterior oropharyngeal edema and posterior oropharyngeal erythema present. No oropharyngeal exudate or tonsillar abscesses.  Cobblestoning posterior pharynx; bilateral TMs air fluid level clear; right TM erythema 75% and bulging; bilateral nasal turbinates edema/erythema clear discharge; bilateral allergic shiners and lower eyelid swelling;   Eyes: Conjunctivae, EOM and lids are normal. Pupils are equal, round, and reactive to light. Right eye exhibits no chemosis, no discharge, no exudate and no hordeolum. No foreign body present in the right eye. Left eye exhibits no chemosis, no discharge, no exudate and  no hordeolum. No foreign body present in the left eye. Right conjunctiva is not injected. Right conjunctiva has no hemorrhage. Left conjunctiva is not injected. Left conjunctiva  has no hemorrhage. No scleral icterus. Right eye exhibits normal extraocular motion and no nystagmus. Left eye exhibits normal extraocular motion and no nystagmus. Right pupil is round and reactive. Left pupil is round and reactive. Pupils are equal.  Neck: Trachea normal, normal range of motion and phonation normal. Neck supple. No tracheal tenderness and no muscular tenderness present. No neck rigidity. No tracheal deviation, no edema, no erythema and normal range of motion present. No thyroid mass and no thyromegaly present.  Cardiovascular: Normal rate, regular rhythm, S1 normal, S2 normal, normal heart sounds and intact distal pulses. PMI is not displaced. Exam reveals no gallop and no friction rub.  No murmur heard. Pulmonary/Chest: Effort normal and breath sounds normal. No accessory muscle usage or stridor. No respiratory distress. He has no decreased breath sounds. He has no wheezes. He has no rhonchi. He has no rales. He exhibits no tenderness.  No cough observed in exam room; spoke full sentences without difficulty  Abdominal: Soft. Normal appearance and bowel sounds are normal. He exhibits no shifting dullness, no distension, no pulsatile liver, no fluid wave, no abdominal bruit, no ascites, no pulsatile midline mass and no mass. There is no hepatosplenomegaly. There is no tenderness. There is no rigidity, no rebound, no guarding, no tenderness at McBurney's point and negative Murphy's sign. Hernia confirmed negative in the ventral area.  Dull to percussion x 4 quads; normoactive bowel sounds x 4 quads  Musculoskeletal: Normal range of motion. He exhibits deformity. He exhibits no edema or tenderness.       Right shoulder: Normal.       Left shoulder: Normal.       Right elbow: Normal.      Left elbow: Normal.        Right hip: Normal.       Left hip: Normal.       Right knee: Normal.       Left knee: Normal.       Cervical back: Normal.       Thoracic back: Normal.       Lumbar back: Normal.       Right hand: Normal.       Left hand: Normal.  Lymphadenopathy:       Head (right side): No submental, no submandibular, no tonsillar, no preauricular, no posterior auricular and no occipital adenopathy present.       Head (left side): No submental, no submandibular, no tonsillar, no preauricular, no posterior auricular and no occipital adenopathy present.    He has no cervical adenopathy.       Right cervical: No superficial cervical, no deep cervical and no posterior cervical adenopathy present.      Left cervical: No superficial cervical, no deep cervical and no posterior cervical adenopathy present.  Neurological: He is alert and oriented to person, place, and time. He has normal strength. He is not disoriented. He displays no atrophy and no tremor. No cranial nerve deficit or sensory deficit. He exhibits normal muscle tone. He displays no seizure activity. Coordination and gait normal. GCS eye subscore is 4. GCS verbal subscore is 5. GCS motor subscore is 6.  On/off exam table/in/out chair without difficulty; gati sure and steady in clinic/hallway  Skin: Skin is warm, dry and intact. No abrasion, no bruising, no burn, no ecchymosis, no laceration, no lesion, no petechiae and no rash noted. He is not diaphoretic. No cyanosis or erythema. No pallor. Nails show no clubbing.  Psychiatric: He has a normal mood  and affect. His speech is normal and behavior is normal. Judgment and thought content normal. Cognition and memory are normal.  Nursing note and vitals reviewed.         Assessment & Plan:  A-right acute otitis media nonsupportive, maxillary sinusitis acute, seasonal allergic rhinitis, viral gastroenteritis  P-Restart flonase 1 spray each nostril BID at home given refills #1 RF6 saline 2 sprays each  nostril q2h wa prn congestion given 1 bottle from clinic stock.  Denied personal or family history of ENT cancer.  Shower BID especially prior to bed. No evidence of systemic bacterial infection, non toxic and well hydrated.  I do not see where any further testing or imaging is necessary at this time.   I will suggest supportive care, rest, good hygiene and encourage the patient to take adequate fluids.  The patient is to return to clinic or EMERGENCY ROOM if symptoms worsen or change significantly.  Exitcare handout on sinusitis and sinus rinse given to patient.  Patient verbalized agreement and understanding of treatment plan and had no further questions at this time.   P2:  Hand washing and cover cough  Supportive treatment.  Start Augmentin 875mg  po BID x 10 days #20 RF0 dispensed from [facility]   No evidence of invasive bacterial infection, non toxic and well hydrated.  This is most likely self limiting viral infection.  I do not see where any further testing or imaging is necessary at this time.   I will suggest supportive care, rest, good hygiene and encourage the patient to take adequate fluids.  The patient is to return to clinic or EMERGENCY ROOM if symptoms worsen or change significantly e.g. ear pain, fever, purulent discharge from ears or bleeding.  Exitcare handout on otitis media  given to patient.  Patient verbalized agreement and understanding of treatment plan.    Patient may use normal saline nasal spray 2 sprays each nostril q2h wa as needed. flonase 53mcg 1 spray each nostril BID #1 RF6 Electronic Rx.  Patient denied personal or family history of ENT cancer.  OTC antihistamine of choice claritin 10mg  po daily #90 RF3 electronic Rx sent to patient pharmacy of choice.  Avoid triggers if possible.  Shower prior to bedtime if exposed to triggers.  If allergic dust/dust mites recommend mattress/pillow covers/encasements; washing linens, vacuuming, sweeping, dusting weekly.  Call or return to clinic  as needed if these symptoms worsen or fail to improve as anticipated.   Exitcare handout on allergic rhinitis and sinus rinse given to patient.  Patient verbalized understanding of instructions, agreed with plan of care and had no further questions at this time.  P2:  Avoidance and hand washing.

## 2017-02-28 ENCOUNTER — Telehealth: Payer: Self-pay | Admitting: *Deleted

## 2017-02-28 MED ORDER — FLUTICASONE PROPIONATE 50 MCG/ACT NA SUSP: 1.0000 | Freq: Two times a day (BID) | NASAL | 6 refills | Status: DC

## 2017-02-28 NOTE — Telephone Encounter (Signed)
Pt went to pick up Flonase Rx at pharmacy and they did not have it. Rx listed as no print. Reordered to pharmacy of choice.

## 2017-02-28 NOTE — Patient Instructions (Addendum)
Sinusitis, Adult Sinusitis is soreness and inflammation of your sinuses. Sinuses are hollow spaces in the bones around your face. Your sinuses are located:  Around your eyes.  In the middle of your forehead.  Behind your nose.  In your cheekbones.  Your sinuses and nasal passages are lined with a stringy fluid (mucus). Mucus normally drains out of your sinuses. When your nasal tissues become inflamed or swollen, the mucus can become trapped or blocked so air cannot flow through your sinuses. This allows bacteria, viruses, and funguses to grow, which leads to infection. Sinusitis can develop quickly and last for 7?10 days (acute) or for more than 12 weeks (chronic). Sinusitis often develops after a cold. What are the causes? This condition is caused by anything that creates swelling in the sinuses or stops mucus from draining, including:  Allergies.  Asthma.  Bacterial or viral infection.  Abnormally shaped bones between the nasal passages.  Nasal growths that contain mucus (nasal polyps).  Narrow sinus openings.  Pollutants, such as chemicals or irritants in the air.  A foreign object stuck in the nose.  A fungal infection. This is rare.  What increases the risk? The following factors may make you more likely to develop this condition:  Having allergies or asthma.  Having had a recent cold or respiratory tract infection.  Having structural deformities or blockages in your nose or sinuses.  Having a weak immune system.  Doing a lot of swimming or diving.  Overusing nasal sprays.  Smoking.  What are the signs or symptoms? The main symptoms of this condition are pain and a feeling of pressure around the affected sinuses. Other symptoms include:  Upper toothache.  Earache.  Headache.  Bad breath.  Decreased sense of smell and taste.  A cough that may get worse at night.  Fatigue.  Fever.  Thick drainage from your nose. The drainage is often green and  it may contain pus (purulent).  Stuffy nose or congestion.  Postnasal drip. This is when extra mucus collects in the throat or back of the nose.  Swelling and warmth over the affected sinuses.  Sore throat.  Sensitivity to light.  How is this diagnosed? This condition is diagnosed based on symptoms, a medical history, and a physical exam. To find out if your condition is acute or chronic, your health care provider may:  Look in your nose for signs of nasal polyps.  Tap over the affected sinus to check for signs of infection.  View the inside of your sinuses using an imaging device that has a light attached (endoscope).  If your health care provider suspects that you have chronic sinusitis, you may also:  Be tested for allergies.  Have a sample of mucus taken from your nose (nasal culture) and checked for bacteria.  Have a mucus sample examined to see if your sinusitis is related to an allergy.  If your sinusitis does not respond to treatment and it lasts longer than 8 weeks, you may have an MRI or CT scan to check your sinuses. These scans also help to determine how severe your infection is. In rare cases, a bone biopsy may be done to rule out more serious types of fungal sinus disease. How is this treated? Treatment for sinusitis depends on the cause and whether your condition is chronic or acute. If a virus is causing your sinusitis, your symptoms will go away on their own within 10 days. You may be given medicines to relieve your symptoms,   including:  Topical nasal decongestants. They shrink swollen nasal passages and let mucus drain from your sinuses.  Antihistamines. These drugs block inflammation that is triggered by allergies. This can help to ease swelling in your nose and sinuses.  Topical nasal corticosteroids. These are nasal sprays that ease inflammation and swelling in your nose and sinuses.  Nasal saline washes. These rinses can help to get rid of thick mucus in  your nose.  If your condition is caused by bacteria, you will be given an antibiotic medicine. If your condition is caused by a fungus, you will be given an antifungal medicine. Surgery may be needed to correct underlying conditions, such as narrow nasal passages. Surgery may also be needed to remove polyps. Follow these instructions at home: Medicines  Take, use, or apply over-the-counter and prescription medicines only as told by your health care provider. These may include nasal sprays.  If you were prescribed an antibiotic medicine, take it as told by your health care provider. Do not stop taking the antibiotic even if you start to feel better. Hydrate and Humidify  Drink enough water to keep your urine clear or pale yellow. Staying hydrated will help to thin your mucus.  Use a cool mist humidifier to keep the humidity level in your home above 50%.  Inhale steam for 10-15 minutes, 3-4 times a day or as told by your health care provider. You can do this in the bathroom while a hot shower is running.  Limit your exposure to cool or dry air. Rest  Rest as much as possible.  Sleep with your head raised (elevated).  Make sure to get enough sleep each night. General instructions  Apply a warm, moist washcloth to your face 3-4 times a day or as told by your health care provider. This will help with discomfort.  Wash your hands often with soap and water to reduce your exposure to viruses and other germs. If soap and water are not available, use hand sanitizer.  Do not smoke. Avoid being around people who are smoking (secondhand smoke).  Keep all follow-up visits as told by your health care provider. This is important. Contact a health care provider if:  You have a fever.  Your symptoms get worse.  Your symptoms do not improve within 10 days. Get help right away if:  You have a severe headache.  You have persistent vomiting.  You have pain or swelling around your face or  eyes.  You have vision problems.  You develop confusion.  Your neck is stiff.  You have trouble breathing. This information is not intended to replace advice given to you by your health care provider. Make sure you discuss any questions you have with your health care provider. Document Released: 12/31/2004 Document Revised: 08/27/2015 Document Reviewed: 10/26/2014 Elsevier Interactive Patient Education  2018 [employer]. Sinus Rinse What is a sinus rinse? A sinus rinse is a simple home treatment that is used to rinse your sinuses with a sterile mixture of salt and water (saline solution). Sinuses are air-filled spaces in your skull behind the bones of your face and forehead that open into your nasal cavity. You will use the following:  Saline solution.  Neti pot or spray bottle. This releases the saline solution into your nose and through your sinuses. Neti pots and spray bottles can be purchased at [employer], a health food store, or online.  When would I do a sinus rinse? A sinus rinse can help to clear  mucus, dirt, dust, or pollen from the nasal cavity. You may do a sinus rinse when you have a cold, a virus, nasal allergy symptoms, a sinus infection, or stuffiness in the nose or sinuses. If you are considering a sinus rinse:  Ask your child's health care provider before performing a sinus rinse on your child.  Do not do a sinus rinse if you have had ear or nasal surgery, ear infection, or blocked ears.  How do I do a sinus rinse?  Wash your hands.  Disinfect your device according to the directions provided and then dry it.  Use the solution that comes with your device or one that is sold separately in stores. Follow the mixing directions on the package.  Fill your device with the amount of saline solution as directed by the device instructions.  Stand over a sink and tilt your head sideways over the sink.  Place the spout of the device in your upper nostril (the  one closer to the ceiling).  Gently pour or squeeze the saline solution into the nasal cavity. The liquid should drain to the lower nostril if you are not overly congested.  Gently blow your nose. Blowing too hard may cause ear pain.  Repeat in the other nostril.  Clean and rinse your device with clean water and then air-dry it. Are there risks of a sinus rinse? Sinus rinse is generally very safe and effective. However, there are a few risks, which include:  A burning sensation in the sinuses. This may happen if you do not make the saline solution as directed. Make sure to follow all directions when making the saline solution.  Infection from contaminated water. This is rare, but possible.  Nasal irritation.  This information is not intended to replace advice given to you by your health care provider. Make sure you discuss any questions you have with your health care provider. Document Released: 07/28/2013 Document Revised: 11/28/2015 Document Reviewed: 05/18/2013 Elsevier Interactive Patient Education  2017 [employer]  Viral Gastroenteritis, Adult Viral gastroenteritis is also known as the stomach flu. This condition is caused by various viruses. These viruses can be passed from person to person very easily (are very contagious). This condition may affect your stomach, small intestine, and large intestine. It can cause sudden watery diarrhea, fever, and vomiting. Diarrhea and vomiting can make you feel weak and cause you to become dehydrated. You may not be able to keep fluids down. Dehydration can make you tired and thirsty, cause you to have a dry mouth, and decrease how often you urinate. Older adults and people with other diseases or a weak immune system are at higher risk for dehydration. It is important to replace the fluids that you lose from diarrhea and vomiting. If you become severely dehydrated, you may need to get fluids through an IV tube. What are the  causes? Gastroenteritis is caused by various viruses, including rotavirus and norovirus. Norovirus is the most common cause in adults. You can get sick by eating food, drinking water, or touching a surface contaminated with one of these viruses. You can also get sick from sharing utensils or other personal items with an infected person. What increases the risk? This condition is more likely to develop in people:  Who have a weak defense system (immune system).  Who live with one or more children who are younger than 30 years old.  Who live in a nursing home.  Who go on cruise ships.  What are the  signs or symptoms? Symptoms of this condition start suddenly 1-2 days after exposure to a virus. Symptoms may last a few days or as long as a week. The most common symptoms are watery diarrhea and vomiting. Other symptoms include:  Fever.  Headache.  Fatigue.  Pain in the abdomen.  Chills.  Weakness.  Nausea.  Muscle aches.  Loss of appetite.  How is this diagnosed? This condition is diagnosed with a medical history and physical exam. You may also have a stool test to check for viruses or other infections. How is this treated? This condition typically goes away on its own. The focus of treatment is to restore lost fluids (rehydration). Your health care provider may recommend that you take an oral rehydration solution (ORS) to replace important salts and minerals (electrolytes) in your body. Severe cases of this condition may require giving fluids through an IV tube. Treatment may also include medicine to help with your symptoms. Follow these instructions at home: Follow instructions from your health care provider about how to care for yourself at home. Eating and drinking Follow these recommendations as told by your health care provider:  Take an ORS. This is a drink that is sold at pharmacies and retail stores.  Drink clear fluids in small amounts as you are able. Clear fluids  include water, ice chips, diluted fruit juice, and low-calorie sports drinks.  Eat bland, easy-to-digest foods in small amounts as you are able. These foods include bananas, applesauce, rice, lean meats, toast, and crackers.  Avoid fluids that contain a lot of sugar or caffeine, such as energy drinks, sports drinks, and soda.  Avoid alcohol.  Avoid spicy or fatty foods.  General instructions   Drink enough fluid to keep your urine clear or pale yellow.  Wash your hands often. If soap and water are not available, use hand sanitizer.  Make sure that all people in your household wash their hands well and often.  Take over-the-counter and prescription medicines only as told by your health care provider.  Rest at home while you recover.  Watch your condition for any changes.  Take a warm bath to relieve any burning or pain from frequent diarrhea episodes.  Keep all follow-up visits as told by your health care provider. This is important. Contact a health care provider if:  You cannot keep fluids down.  Your symptoms get worse.  You have new symptoms.  You feel light-headed or dizzy.  You have muscle cramps. Get help right away if:  You have chest pain.  You feel extremely weak or you faint.  You see blood in your vomit.  Your vomit looks like coffee grounds.  You have bloody or black stools or stools that look like tar.  You have a severe headache, a stiff neck, or both.  You have a rash.  You have severe pain, cramping, or bloating in your abdomen.  You have trouble breathing or you are breathing very quickly.  Your heart is beating very quickly.  Your skin feels cold and clammy.  You feel confused.  You have pain when you urinate.  You have signs of dehydration, such as: ? Dark urine, very little urine, or no urine. ? Cracked lips. ? Dry mouth. ? Sunken eyes. ? Sleepiness. ? Weakness. This information is not intended to replace advice given to  you by your health care provider. Make sure you discuss any questions you have with your health care provider. Document Released: 12/31/2004 Document Revised: 06/14/2015 Document  Reviewed: 09/06/2014 Elsevier Interactive Patient Education  2018 [employer].  Otitis Media, Adult Otitis media occurs when there is inflammation and fluid in the middle ear. Your middle ear is a part of the ear that contains bones for hearing as well as air that helps send sounds to your brain. What are the causes? This condition is caused by a blockage in the eustachian tube. This tube drains fluid from the ear to the back of the nose (nasopharynx). A blockage in this tube can be caused by an object or by swelling (edema) in the tube. Problems that can cause a blockage include:  A cold or other upper respiratory infection.  Allergies.  An irritant, such as tobacco smoke.  Enlarged adenoids. The adenoids are areas of soft tissue located high in the back of the throat, behind the nose and the roof of the mouth.  A mass in the nasopharynx.  Damage to the ear caused by pressure changes (barotrauma).  What are the signs or symptoms? Symptoms of this condition include:  Ear pain.  A fever.  Decreased hearing.  A headache.  Tiredness (lethargy).  Fluid leaking from the ear.  Ringing in the ear.  How is this diagnosed? This condition is diagnosed with a physical exam. During the exam your health care provider will use an instrument called an otoscope to look into your ear and check for redness, swelling, and fluid. He or she will also ask about your symptoms. Your health care provider may also order tests, such as:  A test to check the movement of the eardrum (pneumatic otoscopy). This test is done by squeezing a small amount of air into the ear.  A test that changes air pressure in the middle ear to check how well the eardrum moves and whether the eustachian tube is working (tympanogram).  How is  this treated? This condition usually goes away on its own within 3-5 days. But if the condition is caused by a bacteria infection and does not go away own its own, or keeps coming back, your health care provider may:  Prescribe antibiotic medicines to treat the infection.  Prescribe or recommend medicines to control pain.  Follow these instructions at home:  Take over-the-counter and prescription medicines only as told by your health care provider.  If you were prescribed an antibiotic medicine, take it as told by your health care provider. Do not stop taking the antibiotic even if you start to feel better.  Keep all follow-up visits as told by your health care provider. This is important. Contact a health care provider if:  You have bleeding from your nose.  There is a lump on your neck.  You are not getting better in 5 days.  You feel worse instead of better. Get help right away if:  You have severe pain that is not controlled with medicine.  You have swelling, redness, or pain around your ear.  You have stiffness in your neck.  A part of your face is paralyzed.  The bone behind your ear (mastoid) is tender when you touch it.  You develop a severe headache. Summary  Otitis media is redness, soreness, and swelling of the middle ear.  This condition usually goes away on its own within 3-5 days.  If the problem does not go away in 3-5 days, your health care provider may prescribe or recommend medicines to treat your symptoms.  If you were prescribed an antibiotic medicine, take it as told by your health  care provider. This information is not intended to replace advice given to you by your health care provider. Make sure you discuss any questions you have with your health care provider. Document Released: 10/06/2003 Document Revised: 12/22/2015 Document Reviewed: 12/22/2015 Elsevier Interactive Patient Education  2018 [place]. Nonallergic Rhinitis  1. Nonallergic  rhinitis is a term used by allergist to describe inflammation in the nose that is not due to an allergic source (i.e., pollens, mold, animal dander or dust mites). 2. Nonallergic rhinitis can mimic many of the symptoms caused by allergies.  This includes a runny nose ("rhinorrhea"), sneezing, congestion, ear fullness and post nasal drip. 3. These symptoms usually occur year round but can be made worse by many environmental factors including weather change, irritants such as cigarette smoke, fumes, perfumes, detergents and many others. These are not allergens, but are irritants, and do not cause the formation of antibodies like true allergens.  For this reason most people with nonallergic rhinitis have negative skin tests. 4. Unfortunately, we have a poor understanding of what causes nonallergic rhinitis but certain factors such as deviated septum, sinusitis and nasal polyps may contribute to the symptoms.  Due to the poor understanding of what causes nonallergic rhinitis it cannot be cured and our treatment is only symptomatic to control symptoms. 5. Important factors in the successful treatment of nonallergic rhinitis include avoidance of the irritants that cause symptoms; for example, people who continue to smoke may never improve.  Continuous therapy works better than intermittent.  This may mean taking medications once daily or 3-4 times a day. Helpful treatments include nasal saline lavage, nasal ipratropium (Atrovent), nasal steroids, and decongestants.  Pure antihistamines don't seem to work very well.  Immunotherapy (allergy shots) has no role in the treatment of nonallergic rhinitis.  Several medications may have to be tried before a good combination is found for you.  These medications, in general, are safe for long term use.  Tolerance may develop to the therapeutic effects of these medications: Frequently changing between several helpful medications can prevent this should it occur.Allergic  Rhinitis, Adult Allergic rhinitis is an allergic reaction that affects the mucous membrane inside the nose. It causes sneezing, a runny or stuffy nose, and the feeling of mucus going down the back of the throat (postnasal drip). Allergic rhinitis can be mild to severe. There are two types of allergic rhinitis:  Seasonal. This type is also called hay fever. It happens only during certain seasons.  Perennial. This type can happen at any time of the year.  What are the causes? This condition happens when the body's defense system (immune system) responds to certain harmless substances called allergens as though they were germs.  Seasonal allergic rhinitis is triggered by pollen, which can come from grasses, trees, and weeds. Perennial allergic rhinitis may be caused by:  House dust mites.  Pet dander.  Mold spores.  What are the signs or symptoms? Symptoms of this condition include:  Sneezing.  Runny or stuffy nose (nasal congestion).  Postnasal drip.  Itchy nose.  Tearing of the eyes.  Trouble sleeping.  Daytime sleepiness.  How is this diagnosed? This condition may be diagnosed based on:  Your medical history.  A physical exam.  Tests to check for related conditions, such as: ? Asthma. ? Pink eye. ? Ear infection. ? Upper respiratory infection.  Tests to find out which allergens trigger your symptoms. These may include skin or blood tests.  How is this treated? There is no  cure for this condition, but treatment can help control symptoms. Treatment may include:  Taking medicines that block allergy symptoms, such as antihistamines. Medicine may be given as a shot, nasal spray, or pill.  Avoiding the allergen.  Desensitization. This treatment involves getting ongoing shots until your body becomes less sensitive to the allergen. This treatment may be done if other treatments do not help.  If taking medicine and avoiding the allergen does not work, new, stronger  medicines may be prescribed.  Follow these instructions at home:  Find out what you are allergic to. Common allergens include smoke, dust, and pollen.  Avoid the things you are allergic to. These are some things you can do to help avoid allergens: ? Replace carpet with wood, tile, or vinyl flooring. Carpet can trap dander and dust. ? Do not smoke. Do not allow smoking in your home. ? Change your heating and air conditioning filter at least once a month. ? During allergy season:  Keep windows closed as much as possible.  Plan outdoor activities when pollen counts are lowest. This is usually during the evening hours.  When coming indoors, change clothing and shower before sitting on furniture or bedding.  Take over-the-counter and prescription medicines only as told by your health care provider.  Keep all follow-up visits as told by your health care provider. This is important. Contact a health care provider if:  You have a fever.  You develop a persistent cough.  You make whistling sounds when you breathe (you wheeze).  Your symptoms interfere with your normal daily activities. Get help right away if:  You have shortness of breath. Summary  This condition can be managed by taking medicines as directed and avoiding allergens.  Contact your health care provider if you develop a persistent cough or fever.  During allergy season, keep windows closed as much as possible. This information is not intended to replace advice given to you by your health care provider. Make sure you discuss any questions you have with your health care provider. Document Released: 09/25/2000 Document Revised: 02/08/2016 Document Reviewed: 02/08/2016 Elsevier Interactive Patient Education  [employer].

## 2017-03-04 NOTE — Telephone Encounter (Signed)
Noted; signed.

## 2017-05-01 ENCOUNTER — Encounter (INDEPENDENT_AMBULATORY_CARE_PROVIDER_SITE_OTHER): Payer: Self-pay | Admitting: Vascular Surgery

## 2017-05-01 ENCOUNTER — Ambulatory Visit (INDEPENDENT_AMBULATORY_CARE_PROVIDER_SITE_OTHER): Payer: PRIVATE HEALTH INSURANCE | Admitting: Vascular Surgery

## 2017-05-01 ENCOUNTER — Ambulatory Visit (INDEPENDENT_AMBULATORY_CARE_PROVIDER_SITE_OTHER): Payer: PRIVATE HEALTH INSURANCE

## 2017-05-01 VITALS — BP 152/84 | HR 89 | Resp 15 | Ht 69.0 in | Wt 192.0 lb

## 2017-05-01 DIAGNOSIS — E782 Mixed hyperlipidemia: Secondary | ICD-10-CM | POA: Diagnosis not present

## 2017-05-01 DIAGNOSIS — B20 Human immunodeficiency virus [HIV] disease: Secondary | ICD-10-CM

## 2017-05-01 DIAGNOSIS — I70219 Atherosclerosis of native arteries of extremities with intermittent claudication, unspecified extremity: Secondary | ICD-10-CM | POA: Diagnosis not present

## 2017-05-01 DIAGNOSIS — I10 Essential (primary) hypertension: Secondary | ICD-10-CM | POA: Diagnosis not present

## 2017-05-01 DIAGNOSIS — K219 Gastro-esophageal reflux disease without esophagitis: Secondary | ICD-10-CM | POA: Diagnosis not present

## 2017-05-04 ENCOUNTER — Encounter (INDEPENDENT_AMBULATORY_CARE_PROVIDER_SITE_OTHER): Payer: Self-pay | Admitting: Vascular Surgery

## 2017-05-04 NOTE — Progress Notes (Signed)
MRN : [id number]  Jacob Ali is a 57 y.o. ([birth year]) male who presents with chief complaint of  Chief Complaint  Patient presents with  . Follow-up    3 month LE arterial and ABI study  .  History of Present Illness: The patient returns to the office for followup and review of the noninvasive studies. There have been no interval changes in lower extremity symptoms. No interval shortening of the patient's claudication distance or development of rest pain symptoms. No new ulcers or wounds have occurred since the last visit.  There have been no significant changes to the patient's overall health care.  The patient denies amaurosis fugax or recent TIA symptoms. There are no recent neurological changes noted. The patient denies history of DVT, PE or superficial thrombophlebitis. The patient denies recent episodes of angina or shortness of breath.   ABI Rt=0.87 and Lt=0.84  (previous ABI's Rt=0.91 and Lt=0.93) Duplex ultrasound of the left leg shows moderat restenosis of the stent in the SFA >50%  No outpatient medications have been marked as taking for the 05/01/17 encounter (Office Visit) with Delana Meyer, Dolores Lory, MD.    Past Medical History:  Diagnosis Date  . Depression   . GERD (gastroesophageal reflux disease)   . HIV infection ([place])   . Hyperlipidemia   . Hypertension   . Peripheral vascular disease ([place])   . PONV (postoperative nausea and vomiting)   . Sleep apnea     Past Surgical History:  Procedure Laterality Date  . Examination under anesthesia, repair of anal fissure,  12/04/2000  . PERIPHERAL VASCULAR CATHETERIZATION Left 02/06/2016   Procedure: Lower Extremity Angiography;  Surgeon: Katha Cabal, MD;  Location: [facility];  Service: Cardiovascular;  Laterality: Left;  . PERIPHERAL VASCULAR CATHETERIZATION N/A 02/06/2016   Procedure: Abdominal Aortogram w/Lower Extremity;  Surgeon: Katha Cabal, MD;  Location: [facility];  Service:  Cardiovascular;  Laterality: N/A;  . PERIPHERAL VASCULAR CATHETERIZATION  02/06/2016   Procedure: Lower Extremity Intervention;  Surgeon: Katha Cabal, MD;  Location: [facility];  Service: Cardiovascular;;    Social History Social History   Tobacco Use  . Smoking status: Current Every Day Smoker    Packs/day: 0.50    Years: 40.00    Pack years: 20.00    Types: Cigars  . Smokeless tobacco: Never Used  . Tobacco comment: 1/2PPD  Substance Use Topics  . Alcohol use: Yes    Alcohol/week: 1.8 oz    Types: 3 Standard drinks or equivalent per week    Comment: 2-3 times a week  . Drug use: Yes    Frequency: 2.0 times per week    Types: Marijuana    Family History Family History  Problem Relation Age of Onset  . Throat cancer Father   . Heart disease Father   . Cancer Father        bladder  . Cancer Maternal Grandmother        colon    Allergies  Allergen Reactions  . Tamiflu [Oseltamivir Phosphate] Other (See Comments)    Depressive symptoms     REVIEW OF SYSTEMS (Negative unless checked)  Constitutional: [] Weight loss  [] Fever  [] Chills Cardiac: [] Chest pain   [] Chest pressure   [] Palpitations   [] Shortness of breath when laying flat   [] Shortness of breath with exertion. Vascular:  [x] Pain in legs with walking   [] Pain in legs at rest  [] History of DVT   [] Phlebitis   [  x]Swelling in legs   [] Varicose veins   [] Non-healing ulcers Pulmonary:   [] Uses home oxygen   [] Productive cough   [] Hemoptysis   [] Wheeze  [] COPD   [] Asthma Neurologic:  [] Dizziness   [] Seizures   [] History of stroke   [] History of TIA  [] Aphasia   [] Vissual changes   [] Weakness or numbness in arm   [] Weakness or numbness in leg Musculoskeletal:   [] Joint swelling   [] Joint pain   [] Low back pain Hematologic:  [] Easy bruising  [] Easy bleeding   [] Hypercoagulable state   [] Anemic Gastrointestinal:  [] Diarrhea   [] Vomiting  [] Gastroesophageal reflux/heartburn   [] Difficulty  swallowing. Genitourinary:  [] Chronic kidney disease   [] Difficult urination  [] Frequent urination   [] Blood in urine Skin:  [] Rashes   [] Ulcers  Psychological:  [] History of anxiety   []  History of major depression.  Physical Examination  Vitals:   05/01/17 1625  BP: (!) 152/84  Pulse: 89  Resp: 15  Weight: 192 lb (87.1 kg)  Height: 5\' 9"  (1.753 m)   Body mass index is 28.35 kg/m. Gen: WD/WN, NAD Head: [place]/AT, No temporalis wasting.  Ear/Nose/Throat: Hearing grossly intact, nares w/o erythema or drainage Eyes: PER, EOMI, sclera nonicteric.  Neck: Supple, no large masses.   Pulmonary:  Good air movement, no audible wheezing bilaterally, no use of accessory muscles.  Cardiac: RRR, no JVD Vascular:  2+ edema bilateral ankles Vessel Right Left  Radial Palpable Palpable  PT Trace Palpable Trace Palpable  DP Not Palpable Not Palpable  Gastrointestinal: Non-distended. No guarding/no peritoneal signs.  Musculoskeletal: M/S 5/5 throughout.  No deformity or atrophy.  Neurologic: CN 2-12 intact. Symmetrical.  Speech is fluent. Motor exam as listed above. Psychiatric: Judgment intact, Mood & affect appropriate for pt's clinical situation. Dermatologic: No rashes or ulcers noted.  No changes consistent with cellulitis. Lymph : No lichenification or skin changes of chronic lymphedema.  CBC Lab Results  Component Value Date   WBC 11.4 (H) 02/04/2017   HGB 13.7 02/04/2017   HCT 38.7 02/04/2017   MCV 98.0 02/04/2017   PLT 305 02/04/2017    BMET    Component Value Date/Time   NA 137 02/04/2017 1644   NA 132 (L) 06/06/2016 0941   K 4.1 02/04/2017 1644   CL 103 02/04/2017 1644   CO2 25 02/04/2017 1644   GLUCOSE 133 (H) 02/04/2017 1644   BUN 15 02/04/2017 1644   BUN 16 06/06/2016 0941   CREATININE 1.21 02/04/2017 1644   CALCIUM 9.7 02/04/2017 1644   GFRNONAA 82 06/06/2016 0941   GFRNONAA >60 10/16/2010 1641   GFRAA 95 06/06/2016 0941   GFRAA >60 10/16/2010 1641   CrCl  cannot be calculated (Patient's most recent lab result is older than the maximum 21 days allowed.).  COAG Lab Results  Component Value Date   INR 1.20 02/23/2011   INR 1.27 02/23/2011   INR 1.22 02/22/2011    Radiology No results found.    Assessment/Plan 1. Atherosclerosis with claudication of extremity ([place])  Recommend:  The patient has evidence of atherosclerosis of the lower extremities with claudication.  The patient does not voice lifestyle limiting changes at this point in time.  Noninvasive studies do not suggest clinically significant change.  No invasive studies, angiography or surgery at this time The patient should continue walking and begin a more formal exercise program.  The patient should continue antiplatelet therapy and aggressive treatment of the lipid abnormalities  No changes in the patient's medications at this time  The patient should continue wearing graduated compression socks 10-15 mmHg strength to control the mild edema.   - VAS [place] LOWER EXTREMITY ARTERIAL DUPLEX; Future - VAS [place] ABI WITH/WO TBI; Future  2. Essential hypertension Continue antihypertensive medications as already ordered, these medications have been reviewed and there are no changes at this time.   3. Gastroesophageal reflux disease without esophagitis Continue antihypertensive medications as already ordered, these medications have been reviewed and there are no changes at this time.  Avoidence of caffeine and alcohol  Moderate elevation of the head of the bed   4. Mixed hyperlipidemia Continue statin as ordered and reviewed, no changes at this time   5. HIV infection, unspecified symptom status ([place]) Continue antiretroviral medications as already ordered, these medications have been reviewed and there are no changes at this time.    Hortencia Pilar, MD  05/04/2017 12:50 PM

## 2017-05-06 ENCOUNTER — Other Ambulatory Visit: Payer: Self-pay | Admitting: Infectious Diseases

## 2017-05-06 DIAGNOSIS — N529 Male erectile dysfunction, unspecified: Secondary | ICD-10-CM

## 2017-05-21 ENCOUNTER — Other Ambulatory Visit: Payer: Self-pay

## 2017-05-21 ENCOUNTER — Ambulatory Visit (HOSPITAL_COMMUNITY)
Admission: EM | Admit: 2017-05-21 | Discharge: 2017-05-21 | Disposition: A | Discharge status: Home / Self Care | Payer: No Typology Code available for payment source | Attending: Family Medicine | Admitting: Family Medicine

## 2017-05-21 ENCOUNTER — Encounter (HOSPITAL_COMMUNITY): Payer: Self-pay

## 2017-05-21 DIAGNOSIS — S76211A Strain of adductor muscle, fascia and tendon of right thigh, initial encounter: Secondary | ICD-10-CM

## 2017-05-21 DIAGNOSIS — R059 Cough, unspecified: Secondary | ICD-10-CM

## 2017-05-21 DIAGNOSIS — R05 Cough: Secondary | ICD-10-CM | POA: Diagnosis not present

## 2017-05-21 DIAGNOSIS — R062 Wheezing: Secondary | ICD-10-CM

## 2017-05-21 MED ORDER — PREDNISONE 10 MG (21) PO TBPK: ORAL_TABLET | Freq: Every day | ORAL | 0 refills | Status: DC

## 2017-05-21 MED ORDER — NAPROXEN 500 MG PO TABS: 500.0000 mg | ORAL_TABLET | Freq: Two times a day (BID) | ORAL | 0 refills | Status: DC

## 2017-05-21 NOTE — ED Triage Notes (Signed)
Pt presents with cough x 2 months, 2 weeks ago he felt a pop in his right hip with cough and has had continued pain since. Pt is hiv positive.

## 2017-05-21 NOTE — ED Provider Notes (Signed)
[facility]   [id number] 05/21/17 Arrival Time: 1001  ASSESSMENT & PLAN:  1. Cough   2. Wheezing   3. Groin strain, right, initial encounter     Meds ordered this encounter  Medications  . predniSONE (STERAPRED UNI-PAK 21 TAB) 10 MG (21) TBPK tablet    Sig: Take by mouth daily. Take as directed.    Dispense:  21 tablet    Refill:  0  . naproxen (NAPROSYN) 500 MG tablet    Sig: Take 1 tablet (500 mg total) by mouth 2 (two) times daily.    Dispense:  28 tablet    Refill:  0   Trial of medications above. Does not desire CXR today. May f/u with PCP or here as needed.  Reviewed expectations re: course of current medical issues. Questions answered. Outlined signs and symptoms indicating need for more acute intervention. Patient verbalized understanding. After Visit Summary given.   SUBJECTIVE: History from: patient.  Jacob Ali is a 57 y.o. male who presents with complaint of a persistent dry cough. On and off over the past 2 months. Questions if related to allergies. Slightly more sneezing recently. HIV is undetectable. SOB: none. Wheezing: at times. Fever: no. Overall normal PO intake without n/v. Sick contacts: no. OTC treatment: none.  Also reports R groin discomfort over the past 2 weeks. While coughing hard he felt a pull in R groin. Initially intermittent soreness; now more persistent. Worse with ambulation and certain movements. Better with rest. No bulging reported. No testicle swelling or pain. Normal urination. Takes 800mg  ibuprofen QD; mild help.  Social History   Tobacco Use  Smoking Status Current Every Day Smoker  . Packs/day: 0.50  . Years: 40.00  . Pack years: 20.00  . Types: Cigars  Smokeless Tobacco Never Used  Tobacco Comment   1/2PPD    ROS: As per HPI.   OBJECTIVE:  Vitals:   05/21/17 1020  BP: (!) 148/66  Pulse: 93  Resp: 18  Temp: (!) 97.5 F (36.4 C)  SpO2: 93%  Weight: 192 lb (87.1 kg)     General appearance:  alert HEENT: very mild nasal congestion Neck: supple Lungs: unlabored respirations, symmetrical air entry without active wheezing; cough: mild; no respiratory distress Tenderness reported over R groin; poorly localized; no hernia appreciated Skin: warm and dry Psychological: alert and cooperative; normal mood and affect   Allergies  Allergen Reactions  . Tamiflu [Oseltamivir Phosphate] Other (See Comments)    Depressive symptoms    Past Medical History:  Diagnosis Date  . Depression   . GERD (gastroesophageal reflux disease)   . HIV infection ([place])   . Hyperlipidemia   . Hypertension   . Peripheral vascular disease ([place])   . PONV (postoperative nausea and vomiting)   . Sleep apnea    Family History  Problem Relation Age of Onset  . Throat cancer Father   . Heart disease Father   . Cancer Father        bladder  . Cancer Maternal Grandmother        colon   Social History   Socioeconomic History  . Marital status: Single    Spouse name: Not on file  . Number of children: Not on file  . Years of education: 46  . Highest education level: Not on file  Occupational History  . Occupation: Recruitment consultant: [employer]  Social Needs  . Financial resource strain: Not on file  . Food insecurity:  Worry: Not on file    Inability: Not on file  . Transportation needs:    Medical: Not on file    Non-medical: Not on file  Tobacco Use  . Smoking status: Current Every Day Smoker    Packs/day: 0.50    Years: 40.00    Pack years: 20.00    Types: Cigars  . Smokeless tobacco: Never Used  . Tobacco comment: 1/2PPD  Substance and Sexual Activity  . Alcohol use: Yes    Alcohol/week: 1.8 oz    Types: 3 Standard drinks or equivalent per week    Comment: 2-3 times a week  . Drug use: Yes    Frequency: 2.0 times per week    Types: Marijuana  . Sexual activity: Not Currently    Partners: Male    Birth control/protection: Condom    Comment: pt. given  condoms  Lifestyle  . Physical activity:    Days per week: Not on file    Minutes per session: Not on file  . Stress: Not on file  Relationships  . Social connections:    Talks on phone: Not on file    Gets together: Not on file    Attends religious service: Not on file    Active member of club or organization: Not on file    Attends meetings of clubs or organizations: Not on file    Relationship status: Not on file  . Intimate partner violence:    Fear of current or ex partner: Not on file    Emotionally abused: Not on file    Physically abused: Not on file    Forced sexual activity: Not on file  Other Topics Concern  . Not on file  Social History Narrative   Single, lives alone.             Vanessa Kick, MD 05/21/17 1045

## 2017-05-22 ENCOUNTER — Emergency Department (HOSPITAL_COMMUNITY)
Admission: EM | Admit: 2017-05-22 | Discharge: 2017-05-22 | Disposition: A | Discharge status: Home / Self Care | Payer: No Typology Code available for payment source | Attending: Emergency Medicine | Admitting: Emergency Medicine

## 2017-05-22 ENCOUNTER — Encounter (HOSPITAL_COMMUNITY): Payer: Self-pay | Admitting: Emergency Medicine

## 2017-05-22 ENCOUNTER — Emergency Department (HOSPITAL_COMMUNITY): Payer: No Typology Code available for payment source

## 2017-05-22 DIAGNOSIS — B20 Human immunodeficiency virus [HIV] disease: Secondary | ICD-10-CM | POA: Diagnosis not present

## 2017-05-22 DIAGNOSIS — R05 Cough: Secondary | ICD-10-CM | POA: Insufficient documentation

## 2017-05-22 DIAGNOSIS — I739 Peripheral vascular disease, unspecified: Secondary | ICD-10-CM | POA: Insufficient documentation

## 2017-05-22 DIAGNOSIS — F17210 Nicotine dependence, cigarettes, uncomplicated: Secondary | ICD-10-CM | POA: Diagnosis not present

## 2017-05-22 DIAGNOSIS — R739 Hyperglycemia, unspecified: Secondary | ICD-10-CM | POA: Diagnosis not present

## 2017-05-22 DIAGNOSIS — X58XXXA Exposure to other specified factors, initial encounter: Secondary | ICD-10-CM | POA: Diagnosis not present

## 2017-05-22 DIAGNOSIS — Z79899 Other long term (current) drug therapy: Secondary | ICD-10-CM | POA: Diagnosis not present

## 2017-05-22 DIAGNOSIS — K4090 Unilateral inguinal hernia, without obstruction or gangrene, not specified as recurrent: Secondary | ICD-10-CM | POA: Insufficient documentation

## 2017-05-22 DIAGNOSIS — Y999 Unspecified external cause status: Secondary | ICD-10-CM | POA: Diagnosis not present

## 2017-05-22 DIAGNOSIS — R1031 Right lower quadrant pain: Secondary | ICD-10-CM | POA: Diagnosis present

## 2017-05-22 DIAGNOSIS — Y929 Unspecified place or not applicable: Secondary | ICD-10-CM | POA: Diagnosis not present

## 2017-05-22 DIAGNOSIS — Y939 Activity, unspecified: Secondary | ICD-10-CM | POA: Diagnosis not present

## 2017-05-22 DIAGNOSIS — F1210 Cannabis abuse, uncomplicated: Secondary | ICD-10-CM | POA: Insufficient documentation

## 2017-05-22 DIAGNOSIS — S301XXA Contusion of abdominal wall, initial encounter: Secondary | ICD-10-CM | POA: Diagnosis not present

## 2017-05-22 DIAGNOSIS — T380X5A Adverse effect of glucocorticoids and synthetic analogues, initial encounter: Secondary | ICD-10-CM | POA: Diagnosis not present

## 2017-05-22 LAB — COMPREHENSIVE METABOLIC PANEL
ALT: 72 U/L — ABNORMAL HIGH (ref 17–63)
AST: 33 U/L (ref 15–41)
Albumin: 4.5 g/dL (ref 3.5–5.0)
Alkaline Phosphatase: 65 U/L (ref 38–126)
Anion gap: 10 (ref 5–15)
BUN: 19 mg/dL (ref 6–20)
CO2: 21 mmol/L — ABNORMAL LOW (ref 22–32)
Calcium: 9.5 mg/dL (ref 8.9–10.3)
Chloride: 104 mmol/L (ref 101–111)
Creatinine, Ser: 1.13 mg/dL (ref 0.61–1.24)
GFR calc Af Amer: >60 mL/min (ref >60)
GFR calc non Af Amer: >60 mL/min (ref >60)
Glucose, Bld: 398 mg/dL — ABNORMAL HIGH (ref 65–99)
Potassium: 4.2 mmol/L (ref 3.5–5.1)
Sodium: 135 mmol/L (ref 135–145)
Total Bilirubin: 0.9 mg/dL (ref 0.3–1.2)
Total Protein: 7.5 g/dL (ref 6.5–8.1)

## 2017-05-22 LAB — CBC
HCT: 37.3 % — ABNORMAL LOW (ref 39.0–52.0)
Hemoglobin: 12.5 g/dL — ABNORMAL LOW (ref 13.0–17.0)
MCH: 33.8 pg (ref 26.0–34.0)
MCHC: 33.5 g/dL (ref 30.0–36.0)
MCV: 100.8 fL — ABNORMAL HIGH (ref 78.0–100.0)
Platelets: 279 10*3/uL (ref 150–400)
RBC: 3.7 MIL/uL — ABNORMAL LOW (ref 4.22–5.81)
RDW: 13.1 % (ref 11.5–15.5)
WBC: 22.1 10*3/uL — ABNORMAL HIGH (ref 4.0–10.5)

## 2017-05-22 LAB — URINALYSIS, ROUTINE W REFLEX MICROSCOPIC
Bacteria, UA: NONE SEEN
Bilirubin Urine: NEGATIVE
Glucose, UA: >=500 mg/dL — AB
Hgb urine dipstick: NEGATIVE
Ketones, ur: NEGATIVE mg/dL
Leukocytes, UA: NEGATIVE
Nitrite: NEGATIVE
Protein, ur: NEGATIVE mg/dL
Specific Gravity, Urine: 1.024 (ref 1.005–1.030)
pH: 5 (ref 5.0–8.0)

## 2017-05-22 LAB — LIPASE, BLOOD: Lipase: 35 U/L (ref 11–51)

## 2017-05-22 MED ORDER — HYDROMORPHONE HCL 1 MG/ML IJ SOLN
0.5000 mg | Freq: Once | INTRAMUSCULAR | Status: AC
Start: 2017-05-22 — End: 2017-05-22
  Administered 2017-05-22: 0.5 mg via INTRAVENOUS
  Filled 2017-05-22: qty 1

## 2017-05-22 MED ORDER — SODIUM CHLORIDE 0.9 % IV BOLUS
1000.0000 mL | Freq: Once | INTRAVENOUS | Status: AC
  Administered 2017-05-22: 1000 mL via INTRAVENOUS

## 2017-05-22 MED ORDER — HYDROMORPHONE HCL 1 MG/ML IJ SOLN
0.5000 mg | Freq: Once | INTRAMUSCULAR | Status: DC
  Filled 2017-05-22: qty 1

## 2017-05-22 MED ORDER — HYDROCODONE-ACETAMINOPHEN 5-325 MG PO TABS: 1.0000 | ORAL_TABLET | Freq: Four times a day (QID) | ORAL | 0 refills | Status: DC | PRN

## 2017-05-22 MED ORDER — IOPAMIDOL (ISOVUE-370) INJECTION 76%
100.0000 mL | Freq: Once | INTRAVENOUS | Status: AC | PRN
  Administered 2017-05-22: 100 mL via INTRAVENOUS

## 2017-05-22 MED ORDER — IOPAMIDOL (ISOVUE-370) INJECTION 76%
INTRAVENOUS | Status: AC
  Filled 2017-05-22: qty 100

## 2017-05-22 MED ORDER — IPRATROPIUM-ALBUTEROL 0.5-2.5 (3) MG/3ML IN SOLN: 3.0000 mL | Freq: Once | RESPIRATORY_TRACT | Status: DC

## 2017-05-22 MED ORDER — ONDANSETRON HCL 4 MG/2ML IJ SOLN
4.0000 mg | INTRAMUSCULAR | Status: AC
  Administered 2017-05-22: 4 mg via INTRAVENOUS
  Filled 2017-05-22: qty 2

## 2017-05-22 NOTE — Discharge Instructions (Signed)
Get help right away if: You have pain in the groin that suddenly gets worse. A bulge in the groin gets bigger suddenly and does not go down. You are a man and you have a sudden pain in the scrotum, or the size of your scrotum suddenly changes. A bulge in the groin area becomes red or purple and is painful to the touch. You have nausea or vomiting that does not go away. You feel your heart beating a lot more quickly than normal. You cannot have a bowel movement or pass gas.

## 2017-05-22 NOTE — ED Triage Notes (Addendum)
Patient here from home with complaints of right lower pelvic pain radiating down into groin. Reports that he was seen at urgent care. Coughing and sneezing makes it worse. Denies urinary symptoms.

## 2017-05-22 NOTE — ED Notes (Signed)
Pt ambulates independently with no complications O2 saturation maintains at 95% on room air

## 2017-05-22 NOTE — ED Notes (Signed)
Urine sample has been requested.

## 2017-05-22 NOTE — ED Provider Notes (Signed)
[facility] DEPT Provider Note   CSN: [id number] Arrival date & time: 05/22/17  0846     History   Chief Complaint Chief Complaint  Patient presents with  . Groin Pain  . Abdominal Pain    HPI Jacob Ali is a 57 y.o. male who presents emergency department chief complaint of abdomen and right lower abdominal quadrant pain.  He has a past medical history of HIV, diabetes, peripheral vascular disease and is a daily smoker.  He is status post vascular stenting for claudication.  The patient presents with right lower quadrant abdominal pain.  He has had a cough for about 1 month.  Patient states he is currently on prednisone for his cough.  He was seen and evaluated by a urgent care physician Dr. Marvis Repress yesterday with complaint of pain in the right lower quadrant.  He states that it is much worse whenever he coughs laughs or strains it is also painful when he tries to get up or down.  He has pain that radiates into his testicle but denies back pain, urinary symptoms history of kidney stone.  He denies fevers, chills.  He has compliant with his HIV medications.  Last CD4 count was 1380, and his viral load was undetectable.  HPI  Past Medical History:  Diagnosis Date  . Depression   . GERD (gastroesophageal reflux disease)   . HIV infection ([place])   . Hyperlipidemia   . Hypertension   . Peripheral vascular disease ([place])   . PONV (postoperative nausea and vomiting)   . Sleep apnea     Patient Active Problem List   Diagnosis Date Noted  . Hepatitis B immune 03/11/2016  . Atherosclerosis with claudication of extremity ([place]) 01/29/2016  . Arthralgia 10/26/2014  . GERD (gastroesophageal reflux disease) 06/23/2013  . Tobacco use disorder 04/27/2013  . Erectile dysfunction 10/27/2012  . Insomnia 04/15/2012  . Generalized anxiety disorder 02/26/2011    Class: Acute  . Suicide attempt ([place]) 02/20/2011  . HIV (human immunodeficiency virus infection) ([place])  11/01/2010  . Hypertension 11/01/2010  . Hyperlipidemia 11/01/2010    Past Surgical History:  Procedure Laterality Date  . Examination under anesthesia, repair of anal fissure,  12/04/2000  . PERIPHERAL VASCULAR CATHETERIZATION Left 02/06/2016   Procedure: Lower Extremity Angiography;  Surgeon: Katha Cabal, MD;  Location: [facility];  Service: Cardiovascular;  Laterality: Left;  . PERIPHERAL VASCULAR CATHETERIZATION N/A 02/06/2016   Procedure: Abdominal Aortogram w/Lower Extremity;  Surgeon: Katha Cabal, MD;  Location: [facility];  Service: Cardiovascular;  Laterality: N/A;  . PERIPHERAL VASCULAR CATHETERIZATION  02/06/2016   Procedure: Lower Extremity Intervention;  Surgeon: Katha Cabal, MD;  Location: [facility];  Service: Cardiovascular;;        Home Medications    Prior to Admission medications   Medication Sig Start Date End Date Taking? Authorizing Provider  aspirin (ASPIRIN CHILDRENS) 81 MG chewable tablet Chew 1 tablet (81 mg total) by mouth daily. Take with food 11/27/15  Yes Campbell Riches, MD  bictegravir-emtricitabine-tenofovir AF (BIKTARVY) 50-200-25 MG TABS tablet Take 1 tablet daily by mouth. 11/27/16  Yes Campbell Riches, MD  clopidogrel (PLAVIX) 75 MG tablet TAKE 1 TABLET BY MOUTH EVERY DAY 12/10/16  Yes Schnier, Dolores Lory, MD  gemfibrozil (LOPID) 600 MG tablet Take 1 tablet (600 mg total) by mouth daily. 02/04/17  Yes Campbell Riches, MD  ibuprofen (ADVIL,MOTRIN) 800 MG tablet Take 1 tablet (800 mg  total) by mouth daily as needed for moderate pain. 02/04/17  Yes Campbell Riches, MD  lisinopril-hydrochlorothiazide (PRINZIDE,ZESTORETIC) 20-25 MG tablet Take 1 tablet by mouth daily. 02/04/17  Yes Campbell Riches, MD  loratadine (CLARITIN) 10 MG tablet Take 1 tablet (10 mg total) by mouth daily. 02/27/17  Yes Betancourt, Aura Fey, NP  naproxen (NAPROSYN) 500 MG tablet Take 1 tablet (500 mg total) by mouth 2 (two) times  daily. 05/21/17  Yes Hagler, Aaron Edelman, MD  pantoprazole (PROTONIX) 40 MG tablet TAKE 1 TABLET BY MOUTH EVERY DAY 02/10/17  Yes Schnier, Dolores Lory, MD  predniSONE (STERAPRED UNI-PAK 21 TAB) 10 MG (21) TBPK tablet Take by mouth daily. Take as directed. 05/21/17  Yes Hagler, Aaron Edelman, MD  tadalafil (CIALIS) 5 MG tablet TAKE 1 TABLET BY MOUTH ONCE DAILY AS NEEDED FOR ERECTILE DYSFUNCTION 05/06/17  Yes Campbell Riches, MD  fluticasone (FLONASE) 50 MCG/ACT nasal spray Place 1 spray into both nostrils 2 (two) times daily. 02/28/17 04/29/17  Betancourt, Aura Fey, NP  sodium chloride (OCEAN) 0.65 % SOLN nasal spray Place 2 sprays into both nostrils every 2 (two) hours while awake. Patient not taking: Reported on 05/22/2017 02/27/17 03/29/17  Olen Cordial, NP    Family History Family History  Problem Relation Age of Onset  . Throat cancer Father   . Heart disease Father   . Cancer Father        bladder  . Cancer Maternal Grandmother        colon    Social History Social History   Tobacco Use  . Smoking status: Current Every Day Smoker    Packs/day: 0.50    Years: 40.00    Pack years: 20.00    Types: Cigars  . Smokeless tobacco: Never Used  . Tobacco comment: 1/2PPD  Substance Use Topics  . Alcohol use: Yes    Alcohol/week: 1.8 oz    Types: 3 Standard drinks or equivalent per week    Comment: 2-3 times a week  . Drug use: Yes    Frequency: 2.0 times per week    Types: Marijuana     Allergies   Tamiflu [oseltamivir phosphate]   Review of Systems Review of Systems  Ten systems reviewed and are negative for acute change, except as noted in the HPI.   Physical Exam Updated Vital Signs BP (!) 156/88 (BP Location: Left Arm)   Pulse 87   Temp 98 F (36.7 C) (Oral)   Resp 18   Ht 5\' 9"  (1.753 m)   Wt 87.1 kg (192 lb)   SpO2 95%   BMI 28.35 kg/m   Physical Exam  Constitutional: He appears well-developed and well-nourished. No distress.  Patient appears uncomfortable  HENT:  Head:  Normocephalic and atraumatic.  Eyes: Conjunctivae are normal. No scleral icterus.  Neck: Normal range of motion. Neck supple.  Cardiovascular: Normal rate, regular rhythm and normal heart sounds.  Pulmonary/Chest: Effort normal and breath sounds normal. No respiratory distress.  Abdominal: Soft. There is no tenderness. There is no CVA tenderness. A hernia is present. Hernia confirmed positive in the right inguinal area.  No tenderness to palpation of the abdomen.  There is palpable herniation of bowel tissue into the inguinal canal with cough and Valsalva.  This feels like it reduces and retracts easily when pressure is let off.  No testicular tenderness.  Musculoskeletal: He exhibits no edema.  Neurological: He is alert.  Skin: Skin is warm and dry. He is not diaphoretic.  Psychiatric: His behavior is normal.  Nursing note and vitals reviewed.    ED Treatments / Results  Labs (all labs ordered are listed, but only abnormal results are displayed) Labs Reviewed  COMPREHENSIVE METABOLIC PANEL - Abnormal; Notable for the following components:      Result Value   CO2 21 (*)    Glucose, Bld 398 (*)    ALT 72 (*)    All other components within normal limits  CBC - Abnormal; Notable for the following components:   WBC 22.1 (*)    RBC 3.70 (*)    Hemoglobin 12.5 (*)    HCT 37.3 (*)    MCV 100.8 (*)    All other components within normal limits  LIPASE, BLOOD  URINALYSIS, ROUTINE W REFLEX MICROSCOPIC    EKG None  Radiology Ct Angio Abdomen W And/or Wo Contrast  Result Date: 05/22/2017 CLINICAL DATA:  Patient here from home with complaints of right lower pelvic pain radiating down into groin x several weeks. Reports that he was seen at urgent care. Coughing and sneezing makes it worse. Denies urinary symptoms. EXAM: CT ANGIOGRAPHY ABDOMEN AND PELVIS TECHNIQUE: Multidetector CT imaging of the abdomen and pelvis was performed using the standard protocol during bolus administration of  intravenous contrast. Multiplanar CT image reconstructions and MIPs were obtained to evaluate the vascular anatomy. CONTRAST:  139mL ISOVUE-370 IOPAMIDOL (ISOVUE-370) INJECTION 76% COMPARISON:  None. FINDINGS: VASCULAR Aorta: Moderate calcified atheromatous plaque particularly in the infrarenal segment. No aneurysm, dissection, or stenosis. Celiac: Partially calcified nonocclusive ostial plaque. Patent distally. SMA: Mild nonocclusive eccentric noncalcified plaque. Classic distal branch anatomy. Renals: Both renal arteries are patent without evidence of aneurysm, dissection, vasculitis, fibromuscular dysplasia or significant stenosis. IMA: Patent without evidence of aneurysm, dissection, vasculitis or significant stenosis. Right inflow: Small caliber stent across the origin of the common iliac artery, patent. Eccentric plaque in the native distal common iliac artery to its bifurcation without high-grade stenosis. Internal and external iliac arteries patent. Right visualized outflow: Common femoral artery and proximal SFA patent. Left inflow: Eccentric calcified plaque through the common iliac artery without high-grade stenosis. Origin stenosis of the internal iliac artery, patent distally. External iliac patent. Left visualized outflow: Common femoral artery and proximal SFA patent. Veins: Patent hepatic veins, portal vein, SMV, splenic vein, bilateral renal veins, IVC, and iliac venous system. NONVASCULAR Hepatobiliary: Liver unremarkable. Multiple subcentimeter calcified stones in the nondilated gallbladder. No biliary ductal dilatation. Pancreas: Unremarkable Spleen: Accessory splenule.  Otherwise unremarkable. Adrenals/Urinary Tract: Normal adrenals. No renal mass or hydronephrosis. Urinary bladder physiologically distended. Stomach/Bowel: Stomach is decompressed. Small bowel is nondilated. Normal appendix. Moderate colonic fecal material proximally, decompressed distally without wall thickening or adjacent  inflammatory change. Lymphatic: No abdominal or pelvic adenopathy localized. Musculoskeletal: Right rectus sheath hematoma, with focal enhancement on delayed scan suggesting small focus of extravasation. No fracture or worrisome bone lesion. Other: No ascites. No free air. Mild prostatic enlargement with central calcifications. Review of the MIP images confirms the above findings. IMPRESSION: 1. Acute right rectus sheath hematoma. 2. Aortoiliac atherosclerotic disease with patent right common iliac origin stent. 3. Cholelithiasis Electronically Signed   By: Lucrezia Europe M.D.   On: 05/22/2017 13:57    Procedures Procedures (including critical care time)  Medications Ordered in ED Medications  iopamidol (ISOVUE-370) 76 % injection (has no administration in time range)  HYDROmorphone (DILAUDID) injection 0.5 mg (has no administration in time range)  sodium chloride 0.9 % bolus 1,000 mL (0 mLs Intravenous Stopped 05/22/17  1342)  HYDROmorphone (DILAUDID) injection 0.5 mg (0.5 mg Intravenous Given 05/22/17 1220)  ondansetron (ZOFRAN) injection 4 mg (4 mg Intravenous Given 05/22/17 1220)  iopamidol (ISOVUE-370) 76 % injection 100 mL (100 mLs Intravenous Contrast Given 05/22/17 1323)     Initial Impression / Assessment and Plan / ED Course  I have reviewed the triage vital signs and the nursing notes.  Pertinent labs & imaging results that were available during my care of the patient were reviewed by me and considered in my medical decision making (see chart for details).     Patient with hematoma of the right rectus sheath.  I do not think that this is the cause of the patient's pain as he is not tender along the abdominal wall.  He does have significant pain in the right groin and has what appears to be a reducible right sided inguinal hernia.  Patient is ambulatory and pain is improved.  I have advised patient that he needs to follow closely with [facility].  We did discuss use of a truss for  comfort.  I discussed return precautions with the patient who appears appropriate for discharge at this time Final Clinical Impressions(s) / ED Diagnoses   Final diagnoses:  Hematoma of rectus sheath, initial encounter  Inguinal hernia of left side without obstruction or gangrene  Steroid-induced hyperglycemia    ED Discharge Orders    None       Margarita Mail, PA-C 05/22/17 1801    Virgel Manifold, MD 05/23/17 [phone]

## 2017-06-04 ENCOUNTER — Other Ambulatory Visit (INDEPENDENT_AMBULATORY_CARE_PROVIDER_SITE_OTHER): Payer: Self-pay | Admitting: Vascular Surgery

## 2017-06-05 ENCOUNTER — Other Ambulatory Visit (INDEPENDENT_AMBULATORY_CARE_PROVIDER_SITE_OTHER): Payer: Self-pay | Admitting: Vascular Surgery

## 2017-07-31 ENCOUNTER — Encounter (INDEPENDENT_AMBULATORY_CARE_PROVIDER_SITE_OTHER): Payer: PRIVATE HEALTH INSURANCE

## 2017-07-31 ENCOUNTER — Ambulatory Visit (INDEPENDENT_AMBULATORY_CARE_PROVIDER_SITE_OTHER): Payer: PRIVATE HEALTH INSURANCE | Admitting: Vascular Surgery

## 2017-09-04 ENCOUNTER — Other Ambulatory Visit (INDEPENDENT_AMBULATORY_CARE_PROVIDER_SITE_OTHER): Payer: Self-pay | Admitting: Vascular Surgery

## 2017-10-17 ENCOUNTER — Ambulatory Visit: Payer: Self-pay

## 2017-11-03 ENCOUNTER — Encounter: Payer: Self-pay | Admitting: Infectious Diseases

## 2017-11-03 DIAGNOSIS — D126 Benign neoplasm of colon, unspecified: Secondary | ICD-10-CM

## 2017-11-03 HISTORY — DX: Benign neoplasm of colon, unspecified: D12.6

## 2017-11-17 ENCOUNTER — Ambulatory Visit: Payer: Self-pay | Admitting: Infectious Diseases

## 2017-11-18 ENCOUNTER — Ambulatory Visit: Payer: No Typology Code available for payment source | Admitting: Infectious Diseases

## 2017-11-18 ENCOUNTER — Encounter: Payer: Self-pay | Admitting: Infectious Diseases

## 2017-11-18 VITALS — BP 138/78 | HR 87 | Temp 98.4°F | Wt 215.0 lb

## 2017-11-18 DIAGNOSIS — Z79899 Other long term (current) drug therapy: Secondary | ICD-10-CM

## 2017-11-18 DIAGNOSIS — I10 Essential (primary) hypertension: Secondary | ICD-10-CM

## 2017-11-18 DIAGNOSIS — E669 Obesity, unspecified: Secondary | ICD-10-CM | POA: Insufficient documentation

## 2017-11-18 DIAGNOSIS — R05 Cough: Secondary | ICD-10-CM

## 2017-11-18 DIAGNOSIS — G4700 Insomnia, unspecified: Secondary | ICD-10-CM

## 2017-11-18 DIAGNOSIS — Z113 Encounter for screening for infections with a predominantly sexual mode of transmission: Secondary | ICD-10-CM

## 2017-11-18 DIAGNOSIS — E785 Hyperlipidemia, unspecified: Secondary | ICD-10-CM

## 2017-11-18 DIAGNOSIS — F17200 Nicotine dependence, unspecified, uncomplicated: Secondary | ICD-10-CM

## 2017-11-18 DIAGNOSIS — Z23 Encounter for immunization: Secondary | ICD-10-CM

## 2017-11-18 DIAGNOSIS — B20 Human immunodeficiency virus [HIV] disease: Secondary | ICD-10-CM

## 2017-11-18 DIAGNOSIS — R059 Cough, unspecified: Secondary | ICD-10-CM | POA: Insufficient documentation

## 2017-11-18 MED ORDER — HYDROCOD POLST-CPM POLST ER 10-8 MG/5ML PO SUER: 5.0000 mL | Freq: Two times a day (BID) | ORAL | 0 refills | Status: AC | PRN

## 2017-11-18 NOTE — Progress Notes (Signed)
   Subjective:    Patient ID: Jacob Ali, male    DOB: 06/14/1960, 57 y.o.   MRN: [id number]  HPI 42y.oJerilynn Mages HIV+ September 2012. Hx of HTN, hyperlipidemia, hospitalization for tylenol overdose and cutting of wrists on 02/20/11. Patient reports adherence to Gemfibrozil, lisinopril/HCTZ. He underwent atherectomy(01-2016)L SFA, PTCA and stent of L SFA and popliteal arteries; and PTCA and stent of R common iliac.  Has had f/u at vascular,  05-01-17 ABI Rt=0.87 and Lt=0.84  (previous ABI's Rt=0.91 and Lt=0.93) Duplex ultrasound of the left leg shows moderat restenosis of the stent in the SFA >50%. He has f/u Dec 2019. He is worried that this is worsening. Denies claudication.  Has had some hip pain.  Prev taking tivicay-descovy.Changed to biktarvy 11-2016. Alexa tells him to take his pills everyday.  He was seen in ED on 05-22-17 with a hematoma of the rectus sheath after coughing. He had  f/u with [facility], told he had pulled muscle in groin. This is resolved.  Has been coughing due to "ragweed". Took anbx, no improvement. Has been using flonase, as well as pm cough syrup. No f/c. Previously used tussionex.   HIV 1 RNA Quant (copies/mL)  Date Value  02/04/2017 <20 NOT DETECTED  11/27/2016 <20 NOT DETECTED  07/23/2016 <20 NOT DETECTED   CD4 (no units)  Date Value  11/24/2015 1,130  05/17/2015 1,550  09/30/2014 952   CD4 T Cell Abs (/uL)  Date Value  02/04/2017 1,380  11/27/2016 1,780  03/11/2016 1,030    Review of Systems  Constitutional: Negative for appetite change, chills, fever and unexpected weight change.  Respiratory: Positive for cough. Negative for shortness of breath.   Cardiovascular: Negative for chest pain.  Gastrointestinal: Negative for constipation and diarrhea.  Genitourinary: Negative for difficulty urinating.  Please see HPI. All other systems reviewed and negative.     Objective:   Physical Exam  Constitutional: He is oriented to person, place,  and time. He appears well-developed and well-nourished.  HENT:  Mouth/Throat: No oropharyngeal exudate.  Eyes: Pupils are equal, round, and reactive to light. EOM are normal.  Neck: Normal range of motion. Neck supple.  Cardiovascular: Normal rate, regular rhythm and normal heart sounds.  Pulmonary/Chest: Effort normal and breath sounds normal.  Abdominal: Soft. Bowel sounds are normal. There is no tenderness. There is no guarding.  Musculoskeletal: Normal range of motion. He exhibits no edema.  Lymphadenopathy:    He has no cervical adenopathy.  Neurological: He is alert and oriented to person, place, and time.  Psychiatric: He has a normal mood and affect.          Assessment & Plan:

## 2017-11-18 NOTE — Assessment & Plan Note (Signed)
Has been sleeping well.

## 2017-11-18 NOTE — Addendum Note (Signed)
Addended by: Lenore Cordia on: 11/18/2017 04:42 PM   Modules accepted: Orders

## 2017-11-18 NOTE — Assessment & Plan Note (Signed)
Doing well, asx on current rx Improved from previous.

## 2017-11-18 NOTE — Assessment & Plan Note (Signed)
He will continue on his current rx. Will recheck his lipids.

## 2017-11-18 NOTE — Assessment & Plan Note (Addendum)
He is doing well Will check his labs Check his A1C Offered/refused condoms.  Flu vax at work [employer] today.  He will drop off his FMLA forms in Jan 2020 rtc in 9 months

## 2017-11-18 NOTE — Assessment & Plan Note (Addendum)
Encouraged to quit. @ 1/2 ppd.

## 2017-11-18 NOTE — Assessment & Plan Note (Signed)
Suspect seasonal allegeries.  He asks for tussionex refill, will give him short course.

## 2017-11-18 NOTE — Addendum Note (Signed)
Addended by: Amandalynn Pitz C on: 11/18/2017 04:04 PM   Modules accepted: Orders

## 2017-11-18 NOTE — Assessment & Plan Note (Signed)
He is working on wt loss.

## 2017-11-20 LAB — T-HELPER CELL (CD4) - (RCID CLINIC ONLY)
CD4 % Helper T Cell: 39 % (ref 33–55)
CD4 T Cell Abs: 1300 /uL (ref 400–2700)

## 2017-11-21 LAB — LIPID PANEL
Cholesterol: 172 mg/dL (ref <200)
HDL: 25 mg/dL — ABNORMAL LOW (ref >40)
LDL Cholesterol (Calc): 111 mg/dL (calc) — ABNORMAL HIGH
Non-HDL Cholesterol (Calc): 147 mg/dL (calc) — ABNORMAL HIGH (ref <130)
Total CHOL/HDL Ratio: 6.9 (calc) — ABNORMAL HIGH (ref <5.0)
Triglycerides: 249 mg/dL — ABNORMAL HIGH (ref <150)

## 2017-11-21 LAB — COMPREHENSIVE METABOLIC PANEL
AG Ratio: 1.9 (calc) (ref 1.0–2.5)
ALT: 51 U/L — ABNORMAL HIGH (ref 9–46)
AST: 32 U/L (ref 10–35)
Albumin: 4.8 g/dL (ref 3.6–5.1)
Alkaline phosphatase (APISO): 72 U/L (ref 40–115)
BUN: 12 mg/dL (ref 7–25)
CO2: 26 mmol/L (ref 20–32)
Calcium: 9.8 mg/dL (ref 8.6–10.3)
Chloride: 103 mmol/L (ref 98–110)
Creat: 1.07 mg/dL (ref 0.70–1.33)
Globulin: 2.5 g/dL (calc) (ref 1.9–3.7)
Glucose, Bld: 295 mg/dL — ABNORMAL HIGH (ref 65–99)
Potassium: 4.2 mmol/L (ref 3.5–5.3)
Sodium: 136 mmol/L (ref 135–146)
Total Bilirubin: 0.6 mg/dL (ref 0.2–1.2)
Total Protein: 7.3 g/dL (ref 6.1–8.1)

## 2017-11-21 LAB — CBC
HCT: 37.3 % — ABNORMAL LOW (ref 38.5–50.0)
Hemoglobin: 12.9 g/dL — ABNORMAL LOW (ref 13.2–17.1)
MCH: 34.1 pg — ABNORMAL HIGH (ref 27.0–33.0)
MCHC: 34.6 g/dL (ref 32.0–36.0)
MCV: 98.7 fL (ref 80.0–100.0)
MPV: 11.4 fL (ref 7.5–12.5)
Platelets: 260 10*3/uL (ref 140–400)
RBC: 3.78 10*6/uL — ABNORMAL LOW (ref 4.20–5.80)
RDW: 11.5 % (ref 11.0–15.0)
WBC: 9 10*3/uL (ref 3.8–10.8)

## 2017-11-21 LAB — HEMOGLOBIN A1C
Hgb A1c MFr Bld: 5.4 % of total Hgb (ref <5.7)
Mean Plasma Glucose: 108 (calc)
eAG (mmol/L): 6 (calc)

## 2017-11-21 LAB — HIV-1 RNA QUANT-NO REFLEX-BLD
HIV 1 RNA Quant: <20 copies/mL
HIV-1 RNA Quant, Log: <1.3 Log copies/mL

## 2017-11-25 ENCOUNTER — Telehealth: Payer: Self-pay | Admitting: *Deleted

## 2017-11-25 MED ORDER — LORATADINE 10 MG PO TABS: 10.0000 mg | ORAL_TABLET | Freq: Every day | ORAL | 3 refills | Status: DC

## 2017-11-25 MED ORDER — FLUTICASONE PROPIONATE 50 MCG/ACT NA SUSP: 1.0000 | Freq: Two times a day (BID) | NASAL | 6 refills | Status: DC

## 2017-11-25 NOTE — Telephone Encounter (Signed)
Pt c/o L ear having sensation of fluid inside, scratchy throat. Denies ear drainage or pain, sinus pain/pressure, rhinorrhea, nasal or chest congestion, cough.  Reports he was seen at pcp last week for chest congestion, cough. Was given Tussinex. Felt good while taking this. But once finished, current sx started.   Taking Flonase but only 1 spray once a day. Advised to increase to 1 spray each nostril BID. Not using nasal saline currently. Reviewed using it as a nasal rinse in shower BID, and as mist each nostril q2h while awake. Bottle provided from clinic stock. Not taking any antihistamine at home currently.   Took Claritin last year and reports this worked well for him when he had similar sx and he stayed on it through the winter. Has FSA. Rx placed for Flonase and Claritin. Also c/o itchy watery eyes. No redness, drainage, vision changes. Refresh eye drops given from clinic stock.  Advised to f/u in clinic Thursday am if no improvement with interventions. Also requested chronic med refills. Request completed in paper chart from [facility].

## 2017-11-25 NOTE — Telephone Encounter (Signed)
Noted agreed with plan of care and Rx signed.

## 2017-12-15 ENCOUNTER — Ambulatory Visit (INDEPENDENT_AMBULATORY_CARE_PROVIDER_SITE_OTHER): Payer: PRIVATE HEALTH INSURANCE

## 2017-12-15 ENCOUNTER — Encounter (INDEPENDENT_AMBULATORY_CARE_PROVIDER_SITE_OTHER): Payer: Self-pay | Admitting: Vascular Surgery

## 2017-12-15 ENCOUNTER — Other Ambulatory Visit: Payer: Self-pay

## 2017-12-15 ENCOUNTER — Ambulatory Visit (INDEPENDENT_AMBULATORY_CARE_PROVIDER_SITE_OTHER): Payer: PRIVATE HEALTH INSURANCE | Admitting: Vascular Surgery

## 2017-12-15 VITALS — BP 116/66 | HR 94 | Resp 18 | Ht 69.0 in | Wt 185.0 lb

## 2017-12-15 DIAGNOSIS — I10 Essential (primary) hypertension: Secondary | ICD-10-CM

## 2017-12-15 DIAGNOSIS — K219 Gastro-esophageal reflux disease without esophagitis: Secondary | ICD-10-CM | POA: Diagnosis not present

## 2017-12-15 DIAGNOSIS — E785 Hyperlipidemia, unspecified: Secondary | ICD-10-CM | POA: Diagnosis not present

## 2017-12-15 DIAGNOSIS — I70219 Atherosclerosis of native arteries of extremities with intermittent claudication, unspecified extremity: Secondary | ICD-10-CM

## 2017-12-15 MED ORDER — PANTOPRAZOLE SODIUM 40 MG PO TBEC: 40.0000 mg | DELAYED_RELEASE_TABLET | Freq: Every day | ORAL | 0 refills | Status: DC

## 2017-12-15 NOTE — Progress Notes (Signed)
MRN : [id number]  Jacob Ali is a 57 y.o. (01-Feb-1960) male who presents with chief complaint of  Chief Complaint  Patient presents with  . Follow-up    ultrasound  .  History of Present Illness:   The patient returns to the office for followup and review of the noninvasive studies. There have been no interval changes in lower extremity symptoms. No interval shortening of the patient's claudication distance or development of rest pain symptoms. No new ulcers or wounds have occurred since the last visit.  There have been no significant changes to the patient's overall health care.  The patient denies amaurosis fugax or recent TIA symptoms. There are no recent neurological changes noted. The patient denies history of DVT, PE or superficial thrombophlebitis. The patient denies recent episodes of angina or shortness of breath.   ABI Rt=0.87 and Lt=0.84  (previous ABI's Rt=0.91 and Lt=0.93) Duplex ultrasound of the left leg shows moderat restenosis of the stent in the SFA >50%  No outpatient medications have been marked as taking for the 12/15/17 encounter (Office Visit) with Delana Meyer, Dolores Lory, MD.    Past Medical History:  Diagnosis Date  . Depression   . GERD (gastroesophageal reflux disease)   . HIV infection ([place])   . Hyperlipidemia   . Hypertension   . Peripheral vascular disease ([place])   . PONV (postoperative nausea and vomiting)   . Sleep apnea   . Tubular adenoma of colon 11/03/2017   Colonoscopy Sept 9, 2019    Past Surgical History:  Procedure Laterality Date  . Examination under anesthesia, repair of anal fissure,  12/04/2000  . PERIPHERAL VASCULAR CATHETERIZATION Left 02/06/2016   Procedure: Lower Extremity Angiography;  Surgeon: Katha Cabal, MD;  Location: [place] [facility];  Service: Cardiovascular;  Laterality: Left;  . PERIPHERAL VASCULAR CATHETERIZATION N/A 02/06/2016   Procedure: Abdominal Aortogram w/Lower Extremity;  Surgeon: Katha Cabal, MD;  Location: [place] [facility];  Service: Cardiovascular;  Laterality: N/A;  . PERIPHERAL VASCULAR CATHETERIZATION  02/06/2016   Procedure: Lower Extremity Intervention;  Surgeon: Katha Cabal, MD;  Location: [place] [facility];  Service: Cardiovascular;;    Social History Social History   Tobacco Use  . Smoking status: Current Every Day Smoker    Packs/day: 0.50    Years: 40.00    Pack years: 20.00    Types: Cigars  . Smokeless tobacco: Never Used  . Tobacco comment: 1/2PPD  Substance Use Topics  . Alcohol use: Yes    Alcohol/week: 3.0 standard drinks    Types: 3 Standard drinks or equivalent per week    Comment: 2-3 times a week  . Drug use: Yes    Frequency: 2.0 times per week    Types: Marijuana    Family History Family History  Problem Relation Age of Onset  . Throat cancer Father   . Heart disease Father   . Cancer Father        bladder  . Cancer Maternal Grandmother        colon    Allergies  Allergen Reactions  . Tamiflu [Oseltamivir Phosphate] Other (See Comments)    Depressive symptoms     REVIEW OF SYSTEMS (Negative unless checked)  Constitutional: [] Weight loss  [] Fever  [] Chills Cardiac: [] Chest pain   [] Chest pressure   [] Palpitations   [] Shortness of breath when laying flat   [] Shortness of breath with exertion. Vascular:  [x] Pain in legs with walking   [] Pain in  legs at rest  [] History of DVT   [] Phlebitis   [] Swelling in legs   [] Varicose veins   [] Non-healing ulcers Pulmonary:   [] Uses home oxygen   [] Productive cough   [] Hemoptysis   [] Wheeze  [] COPD   [] Asthma Neurologic:  [] Dizziness   [] Seizures   [] History of stroke   [] History of TIA  [] Aphasia   [] Vissual changes   [] Weakness or numbness in arm   [] Weakness or numbness in leg Musculoskeletal:   [] Joint swelling   [x] Joint pain   [] Low back pain Hematologic:  [] Easy bruising  [] Easy bleeding   [] Hypercoagulable state   [] Anemic Gastrointestinal:  [] Diarrhea   [] Vomiting   [x] Gastroesophageal reflux/heartburn   [] Difficulty swallowing. Genitourinary:  [] Chronic kidney disease   [] Difficult urination  [] Frequent urination   [] Blood in urine Skin:  [] Rashes   [] Ulcers  Psychological:  [] History of anxiety   []  History of major depression.  Physical Examination  There were no vitals filed for this visit. There is no height or weight on file to calculate BMI. Gen: WD/WN, NAD Head: [place]/AT, No temporalis wasting.  Ear/Nose/Throat: Hearing grossly intact, nares w/o erythema or drainage Eyes: PER, EOMI, sclera nonicteric.  Neck: Supple, no large masses.   Pulmonary:  Good air movement, no audible wheezing bilaterally, no use of accessory muscles.  Cardiac: RRR, no JVD Vascular: scattered varicosities present bilaterally.  Mild venous stasis changes to the legs bilaterally.  2+ soft pitting edema Vessel Right Left  Radial Palpable Palpable  PT Palpable Palpable  DP Palpable Palpable  Gastrointestinal: Non-distended. No guarding/no peritoneal signs.  Musculoskeletal: M/S 5/5 throughout.  No deformity or atrophy.  Neurologic: CN 2-12 intact. Symmetrical.  Speech is fluent. Motor exam as listed above. Psychiatric: Judgment intact, Mood & affect appropriate for pt's clinical situation. Dermatologic: No rashes or ulcers noted.  No changes consistent with cellulitis. Lymph : No lichenification or skin changes of chronic lymphedema.  CBC Lab Results  Component Value Date   WBC 9.0 11/18/2017   HGB 12.9 (L) 11/18/2017   HCT 37.3 (L) 11/18/2017   MCV 98.7 11/18/2017   PLT 260 11/18/2017    BMET    Component Value Date/Time   NA 136 11/18/2017 1627   NA 132 (L) 06/06/2016 0941   K 4.2 11/18/2017 1627   CL 103 11/18/2017 1627   CO2 26 11/18/2017 1627   GLUCOSE 295 (H) 11/18/2017 1627   BUN 12 11/18/2017 1627   BUN 16 06/06/2016 0941   CREATININE 1.07 11/18/2017 1627   CALCIUM 9.8 11/18/2017 1627   GFRNONAA >60 05/22/2017 1222   GFRNONAA >60 10/16/2010  1641   GFRAA >60 05/22/2017 1222   GFRAA >60 10/16/2010 1641   CrCl cannot be calculated (Patient's most recent lab result is older than the maximum 21 days allowed.).  COAG Lab Results  Component Value Date   INR 1.20 02/23/2011   INR 1.27 02/23/2011   INR 1.22 02/22/2011    Radiology No results found.    Assessment/Plan 1. Atherosclerosis with claudication of extremity ([place])  Recommend:  The patient has evidence of atherosclerosis of the lower extremities with claudication.  The patient does not voice lifestyle limiting changes at this point in time.  Noninvasive studies do not suggest clinically significant change.  No invasive studies, angiography or surgery at this time The patient should continue walking and begin a more formal exercise program.  The patient should continue antiplatelet therapy and aggressive treatment of the lipid abnormalities  No changes in  the patient's medications at this time  The patient should continue wearing graduated compression socks 10-15 mmHg strength to control the mild edema.    2. Essential hypertension Continue antihypertensive medications as already ordered, these medications have been reviewed and there are no changes at this time.   3. Gastroesophageal reflux disease without esophagitis Continue PPI as already ordered, this medication has been reviewed and there are no changes at this time.  Avoidence of caffeine and alcohol  Moderate elevation of the head of the bed   4. Hyperlipidemia, unspecified hyperlipidemia type Continue statin as ordered and reviewed, no changes at this time    Hortencia Pilar, MD  12/15/2017 3:10 PM

## 2017-12-17 ENCOUNTER — Other Ambulatory Visit: Payer: Self-pay | Admitting: Infectious Diseases

## 2017-12-17 DIAGNOSIS — B20 Human immunodeficiency virus [HIV] disease: Secondary | ICD-10-CM

## 2017-12-18 ENCOUNTER — Encounter (INDEPENDENT_AMBULATORY_CARE_PROVIDER_SITE_OTHER): Payer: Self-pay | Admitting: Vascular Surgery

## 2017-12-28 ENCOUNTER — Other Ambulatory Visit (INDEPENDENT_AMBULATORY_CARE_PROVIDER_SITE_OTHER): Payer: Self-pay | Admitting: Vascular Surgery

## 2018-01-06 ENCOUNTER — Other Ambulatory Visit (INDEPENDENT_AMBULATORY_CARE_PROVIDER_SITE_OTHER): Payer: Self-pay | Admitting: Vascular Surgery

## 2018-01-09 ENCOUNTER — Other Ambulatory Visit: Payer: Self-pay | Admitting: Infectious Diseases

## 2018-01-09 DIAGNOSIS — N529 Male erectile dysfunction, unspecified: Secondary | ICD-10-CM

## 2018-01-09 NOTE — Telephone Encounter (Signed)
Is this medication ok to refill? 

## 2018-01-12 ENCOUNTER — Other Ambulatory Visit: Payer: Self-pay | Admitting: *Deleted

## 2018-01-12 DIAGNOSIS — N529 Male erectile dysfunction, unspecified: Secondary | ICD-10-CM

## 2018-01-12 MED ORDER — TADALAFIL 5 MG PO TABS: ORAL_TABLET | ORAL | 1 refills | Status: DC

## 2018-01-21 ENCOUNTER — Telehealth: Payer: Self-pay | Admitting: Behavioral Health

## 2018-01-21 DIAGNOSIS — E782 Mixed hyperlipidemia: Secondary | ICD-10-CM

## 2018-01-21 DIAGNOSIS — M1991 Primary osteoarthritis, unspecified site: Secondary | ICD-10-CM

## 2018-01-21 DIAGNOSIS — I10 Essential (primary) hypertension: Secondary | ICD-10-CM

## 2018-01-21 MED ORDER — IBUPROFEN 800 MG PO TABS: 800.0000 mg | ORAL_TABLET | Freq: Every day | ORAL | 2 refills | Status: DC | PRN

## 2018-01-21 MED ORDER — GEMFIBROZIL 600 MG PO TABS: 600.0000 mg | ORAL_TABLET | Freq: Every day | ORAL | 2 refills | Status: DC

## 2018-01-21 MED ORDER — LISINOPRIL-HYDROCHLOROTHIAZIDE 20-25 MG PO TABS: 1.0000 | ORAL_TABLET | Freq: Every day | ORAL | 2 refills | Status: DC

## 2018-01-21 NOTE — Telephone Encounter (Signed)
Patient called today stating Dr. 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  Patient is requesting a prescription for Protonix 40 mg daily.  Patient states he needs new prescriptions for his Lopid, Lisinopril-HCTZ, and Ibuprofen. He states he needs the RX printed because he can get them filled at work for free. Prescriptions will be printed and signed and placed upfront for patient to pick up. Pricilla Riffle RN

## 2018-01-23 ENCOUNTER — Other Ambulatory Visit: Payer: Self-pay | Admitting: Behavioral Health

## 2018-01-23 MED ORDER — PANTOPRAZOLE SODIUM 40 MG PO TBEC: 40.0000 mg | DELAYED_RELEASE_TABLET | Freq: Every day | ORAL | 2 refills | Status: DC

## 2018-01-23 NOTE — Telephone Encounter (Signed)
Called mr [place], left a voice mail to call the office back.  His printed prescriptions are ready for pick up.  Prescriptions are located in triage in the accordion file. Pricilla Riffle RN

## 2018-01-28 NOTE — Telephone Encounter (Addendum)
Patient picked up medication, dropped off FMLA form for Dr Johnnye Sima to complete (along with copy of form previously filled out by Dr Johnnye Sima). If possible, he needs this within 1 week.  Copy of FMLA placed in triage, original in Dr Algis Downs box. Please fax completed form and mail the original to patient in provided self-addressed-stamped-envelope. Landis Gandy, RN

## 2018-02-06 ENCOUNTER — Telehealth: Payer: Self-pay | Admitting: *Deleted

## 2018-02-06 NOTE — Telephone Encounter (Signed)
FMLA faxed per DR Johnnye Sima to [employer] [facility] on 02/05/2018. Landis Gandy, RN

## 2018-05-01 ENCOUNTER — Telehealth: Payer: Self-pay | Admitting: Pharmacist

## 2018-05-01 NOTE — Telephone Encounter (Signed)
Called patient to see how he was doing on [place].  No issues or side effects.  He missed one dose last week when he went to get his medications from his pill box and noticed Biktarvy was still in there from the day before. No other problems.

## 2018-06-12 ENCOUNTER — Other Ambulatory Visit: Payer: Self-pay | Admitting: Infectious Diseases

## 2018-06-12 DIAGNOSIS — B20 Human immunodeficiency virus [HIV] disease: Secondary | ICD-10-CM

## 2018-06-18 ENCOUNTER — Encounter (INDEPENDENT_AMBULATORY_CARE_PROVIDER_SITE_OTHER): Payer: No Typology Code available for payment source

## 2018-06-18 ENCOUNTER — Ambulatory Visit (INDEPENDENT_AMBULATORY_CARE_PROVIDER_SITE_OTHER): Payer: No Typology Code available for payment source | Admitting: Vascular Surgery

## 2018-06-30 ENCOUNTER — Other Ambulatory Visit (INDEPENDENT_AMBULATORY_CARE_PROVIDER_SITE_OTHER): Payer: Self-pay | Admitting: Vascular Surgery

## 2018-08-28 ENCOUNTER — Other Ambulatory Visit (INDEPENDENT_AMBULATORY_CARE_PROVIDER_SITE_OTHER): Payer: Self-pay | Admitting: Vascular Surgery

## 2018-08-28 DIAGNOSIS — I70219 Atherosclerosis of native arteries of extremities with intermittent claudication, unspecified extremity: Secondary | ICD-10-CM

## 2018-08-31 ENCOUNTER — Ambulatory Visit (INDEPENDENT_AMBULATORY_CARE_PROVIDER_SITE_OTHER): Payer: No Typology Code available for payment source | Admitting: Vascular Surgery

## 2018-08-31 ENCOUNTER — Other Ambulatory Visit: Payer: Self-pay

## 2018-08-31 ENCOUNTER — Encounter (INDEPENDENT_AMBULATORY_CARE_PROVIDER_SITE_OTHER): Payer: Self-pay | Admitting: Vascular Surgery

## 2018-08-31 ENCOUNTER — Ambulatory Visit (INDEPENDENT_AMBULATORY_CARE_PROVIDER_SITE_OTHER): Payer: No Typology Code available for payment source

## 2018-08-31 VITALS — BP 130/72 | HR 81 | Resp 10 | Ht 69.0 in | Wt 177.0 lb

## 2018-08-31 DIAGNOSIS — E785 Hyperlipidemia, unspecified: Secondary | ICD-10-CM

## 2018-08-31 DIAGNOSIS — I70219 Atherosclerosis of native arteries of extremities with intermittent claudication, unspecified extremity: Secondary | ICD-10-CM | POA: Diagnosis not present

## 2018-08-31 DIAGNOSIS — I10 Essential (primary) hypertension: Secondary | ICD-10-CM

## 2018-08-31 DIAGNOSIS — K219 Gastro-esophageal reflux disease without esophagitis: Secondary | ICD-10-CM

## 2018-08-31 NOTE — Progress Notes (Signed)
MRN : [id number]  Jacob Ali is a 57 y.o. ([birth year]) male who presents with chief complaint of  Chief Complaint  Patient presents with  . Follow-up  .  History of Present Illness:   The patient returns to the office for followup and review of the noninvasive studies. There have been no interval changes in lower extremity symptoms. No interval shortening of the patient's claudication distance or development of rest pain symptoms. No new ulcers or wounds have occurred since the last visit.  There have been no significant changes to the patient's overall health care.  The patient denies amaurosis fugax or recent TIA symptoms. There are no recent neurological changes noted. The patient denies history of DVT, PE or superficial thrombophlebitis. The patient denies recent episodes of angina or shortness of breath.   ABI Rt=0.97and Lt=0.83(previous ABI's Rt=0.87and Lt=0.84) Previous duplex ultrasound of theleft leg shows moderat restenosis of the stent in the SFA >50%   Current Meds  Medication Sig  . aspirin (ASPIRIN CHILDRENS) 81 MG chewable tablet Chew 1 tablet (81 mg total) by mouth daily. Take with food  . BIKTARVY 50-200-25 MG TABS tablet TAKE 1 TABLET BY MOUTH DAILY  . clopidogrel (PLAVIX) 75 MG tablet TAKE 1 TABLET BY MOUTH EVERY DAY  . fluticasone (FLONASE) 50 MCG/ACT nasal spray Place 1 spray into both nostrils 2 (two) times daily.  Marland Kitchen gemfibrozil (LOPID) 600 MG tablet Take 1 tablet (600 mg total) by mouth daily.  Marland Kitchen ibuprofen (ADVIL,MOTRIN) 800 MG tablet Take 1 tablet (800 mg total) by mouth daily as needed for moderate pain.  Marland Kitchen lisinopril-hydrochlorothiazide (PRINZIDE,ZESTORETIC) 20-25 MG tablet Take 1 tablet by mouth daily.  Marland Kitchen loratadine (CLARITIN) 10 MG tablet Take 1 tablet (10 mg total) by mouth daily.  . pantoprazole (PROTONIX) 40 MG tablet Take 1 tablet (40 mg total) by mouth daily.  . tadalafil (CIALIS) 5 MG tablet TAKE 1 TABLET BY MOUTH ONCE DAILY AS  NEEDED FOR ERECTILE DYSFUNCTION    Past Medical History:  Diagnosis Date  . Depression   . GERD (gastroesophageal reflux disease)   . HIV infection ([place])   . Hyperlipidemia   . Hypertension   . Peripheral vascular disease ([place])   . PONV (postoperative nausea and vomiting)   . Sleep apnea   . Tubular adenoma of colon 11/03/2017   Colonoscopy Sept 9, 2019    Past Surgical History:  Procedure Laterality Date  . Examination under anesthesia, repair of anal fissure,  12/04/2000  . PERIPHERAL VASCULAR CATHETERIZATION Left 02/06/2016   Procedure: Lower Extremity Angiography;  Surgeon: Katha Cabal, MD;  Location: [facility];  Service: Cardiovascular;  Laterality: Left;  . PERIPHERAL VASCULAR CATHETERIZATION N/A 02/06/2016   Procedure: Abdominal Aortogram w/Lower Extremity;  Surgeon: Katha Cabal, MD;  Location: [facility];  Service: Cardiovascular;  Laterality: N/A;  . PERIPHERAL VASCULAR CATHETERIZATION  02/06/2016   Procedure: Lower Extremity Intervention;  Surgeon: Katha Cabal, MD;  Location: [facility];  Service: Cardiovascular;;    Social History Social History   Tobacco Use  . Smoking status: Current Every Day Smoker    Packs/day: 0.50    Years: 40.00    Pack years: 20.00    Types: Cigars  . Smokeless tobacco: Never Used  . Tobacco comment: 1/2PPD  Substance Use Topics  . Alcohol use: Yes    Alcohol/week: 3.0 standard drinks    Types: 3 Standard drinks or equivalent per week    Comment:  2-3 times a week  . Drug use: Yes    Frequency: 2.0 times per week    Types: Marijuana    Family History Family History  Problem Relation Age of Onset  . Throat cancer Father   . Heart disease Father   . Cancer Father        bladder  . Cancer Maternal Grandmother        colon    Allergies  Allergen Reactions  . Tamiflu [Oseltamivir Phosphate] Other (See Comments)    Depressive symptoms     REVIEW OF SYSTEMS (Negative unless  checked)  Constitutional: [] Weight loss  [] Fever  [] Chills Cardiac: [] Chest pain   [] Chest pressure   [] Palpitations   [] Shortness of breath when laying flat   [] Shortness of breath with exertion. Vascular:  [x] Pain in legs with walking   [] Pain in legs at rest  [] History of DVT   [] Phlebitis   [] Swelling in legs   [] Varicose veins   [] Non-healing ulcers Pulmonary:   [] Uses home oxygen   [] Productive cough   [] Hemoptysis   [] Wheeze  [] COPD   [] Asthma Neurologic:  [] Dizziness   [] Seizures   [] History of stroke   [] History of TIA  [] Aphasia   [] Vissual changes   [] Weakness or numbness in arm   [] Weakness or numbness in leg Musculoskeletal:   [] Joint swelling   [] Joint pain   [] Low back pain Hematologic:  [] Easy bruising  [] Easy bleeding   [] Hypercoagulable state   [] Anemic Gastrointestinal:  [] Diarrhea   [] Vomiting  [x] Gastroesophageal reflux/heartburn   [] Difficulty swallowing. Genitourinary:  [] Chronic kidney disease   [] Difficult urination  [] Frequent urination   [] Blood in urine Skin:  [] Rashes   [] Ulcers  Psychological:  [] History of anxiety   []  History of major depression.  Physical Examination  Vitals:   08/31/18 1433  BP: 130/72  Pulse: 81  Resp: 10  Weight: 177 lb (80.3 kg)  Height: 5\' 9"  (1.753 m)   Body mass index is 26.14 kg/m. Gen: WD/WN, NAD Head: [place]/AT, No temporalis wasting.  Ear/Nose/Throat: Hearing grossly intact, nares w/o erythema or drainage Eyes: PER, EOMI, sclera nonicteric.  Neck: Supple, no large masses.   Pulmonary:  Good air movement, no audible wheezing bilaterally, no use of accessory muscles.  Cardiac: RRR, no JVD Vascular:  Vessel Right Left  PT Trace Palpable Trace Palpable  DP Trace Palpable Trace Palpable  Gastrointestinal: Non-distended. No guarding/no peritoneal signs.  Musculoskeletal: M/S 5/5 throughout.  No deformity or atrophy.  Neurologic: CN 2-12 intact. Symmetrical.  Speech is fluent. Motor exam as listed above. Psychiatric: Judgment  intact, Mood & affect appropriate for pt's clinical situation. Dermatologic: No rashes or ulcers noted.  No changes consistent with cellulitis. Lymph : No lichenification or skin changes of chronic lymphedema.  CBC Lab Results  Component Value Date   WBC 9.0 11/18/2017   HGB 12.9 (L) 11/18/2017   HCT 37.3 (L) 11/18/2017   MCV 98.7 11/18/2017   PLT 260 11/18/2017    BMET    Component Value Date/Time   NA 136 11/18/2017 1627   NA 132 (L) 06/06/2016 0941   K 4.2 11/18/2017 1627   CL 103 11/18/2017 1627   CO2 26 11/18/2017 1627   GLUCOSE 295 (H) 11/18/2017 1627   BUN 12 11/18/2017 1627   BUN 16 06/06/2016 0941   CREATININE 1.07 11/18/2017 1627   CALCIUM 9.8 11/18/2017 1627   GFRNONAA >60 05/22/2017 1222   GFRNONAA >60 10/16/2010 1641   GFRAA >60 05/22/2017 1222  GFRAA >60 10/16/2010 1641   CrCl cannot be calculated (Patient's most recent lab result is older than the maximum 21 days allowed.).  COAG Lab Results  Component Value Date   INR 1.20 02/23/2011   INR 1.27 02/23/2011   INR 1.22 02/22/2011    Radiology No results found.  Assessment/Plan 1. Atherosclerosis with claudication of extremity ([place])  Recommend:  The patient has evidence of atherosclerosis of the lower extremities with claudication.  The patient does not voice lifestyle limiting changes at this point in time.  Noninvasive studies do not suggest clinically significant change.  No invasive studies, angiography or surgery at this time The patient should continue walking and begin a more formal exercise program.  The patient should continue antiplatelet therapy and aggressive treatment of the lipid abnormalities  No changes in the patient's medications at this time  The patient should continue wearing graduated compression socks 10-15 mmHg strength to control the mild edema.   - VAS [place] ABI WITH/WO TBI; Future - VAS [place] LOWER EXTREMITY ARTERIAL DUPLEX; Future  2. Essential hypertension Continue  antihypertensive medications as already ordered, these medications have been reviewed and there are no changes at this time.   3. Hyperlipidemia, unspecified hyperlipidemia type Continue statin as ordered and reviewed, no changes at this time   4. Gastroesophageal reflux disease without esophagitis Continue PPI as already ordered, this medication has been reviewed and there are no changes at this time.  Avoidence of caffeine and alcohol  Moderate elevation of the head of the bed    Hortencia Pilar, MD  08/31/2018 3:30 PM

## 2018-09-14 ENCOUNTER — Other Ambulatory Visit: Payer: Self-pay | Admitting: Infectious Diseases

## 2018-09-14 DIAGNOSIS — B20 Human immunodeficiency virus [HIV] disease: Secondary | ICD-10-CM

## 2018-11-17 ENCOUNTER — Other Ambulatory Visit: Payer: Self-pay | Admitting: Infectious Diseases

## 2018-11-17 ENCOUNTER — Other Ambulatory Visit: Payer: Self-pay | Admitting: Registered Nurse

## 2018-11-17 ENCOUNTER — Ambulatory Visit (INDEPENDENT_AMBULATORY_CARE_PROVIDER_SITE_OTHER): Payer: PRIVATE HEALTH INSURANCE | Admitting: Infectious Diseases

## 2018-11-17 ENCOUNTER — Other Ambulatory Visit: Payer: Self-pay

## 2018-11-17 DIAGNOSIS — M1991 Primary osteoarthritis, unspecified site: Secondary | ICD-10-CM | POA: Diagnosis not present

## 2018-11-17 DIAGNOSIS — E669 Obesity, unspecified: Secondary | ICD-10-CM

## 2018-11-17 DIAGNOSIS — Z79899 Other long term (current) drug therapy: Secondary | ICD-10-CM

## 2018-11-17 DIAGNOSIS — I10 Essential (primary) hypertension: Secondary | ICD-10-CM | POA: Diagnosis not present

## 2018-11-17 DIAGNOSIS — Z23 Encounter for immunization: Secondary | ICD-10-CM

## 2018-11-17 DIAGNOSIS — E782 Mixed hyperlipidemia: Secondary | ICD-10-CM | POA: Diagnosis not present

## 2018-11-17 DIAGNOSIS — Z113 Encounter for screening for infections with a predominantly sexual mode of transmission: Secondary | ICD-10-CM | POA: Diagnosis not present

## 2018-11-17 DIAGNOSIS — F17200 Nicotine dependence, unspecified, uncomplicated: Secondary | ICD-10-CM

## 2018-11-17 DIAGNOSIS — B20 Human immunodeficiency virus [HIV] disease: Secondary | ICD-10-CM

## 2018-11-17 DIAGNOSIS — K219 Gastro-esophageal reflux disease without esophagitis: Secondary | ICD-10-CM

## 2018-11-17 MED ORDER — IBUPROFEN 800 MG PO TABS: 800.0000 mg | ORAL_TABLET | Freq: Every day | ORAL | 2 refills | Status: DC | PRN

## 2018-11-17 MED ORDER — LISINOPRIL-HYDROCHLOROTHIAZIDE 20-25 MG PO TABS: 1.0000 | ORAL_TABLET | Freq: Every day | ORAL | 2 refills | Status: DC

## 2018-11-17 MED ORDER — GEMFIBROZIL 600 MG PO TABS: 600.0000 mg | ORAL_TABLET | Freq: Every day | ORAL | 2 refills | Status: DC

## 2018-11-17 NOTE — Assessment & Plan Note (Signed)
His meds are refilled.  Well controlled today in clinic.

## 2018-11-17 NOTE — Addendum Note (Signed)
Addended by: Laverle Patter on: 11/17/2018 02:41 PM   Modules accepted: Orders

## 2018-11-17 NOTE — Assessment & Plan Note (Addendum)
Will check his labs today.  Offered/refused condoms.  Has gotten flu shot. Continue biktarvy.  Needs PCV 23  Have him seen by [facility] He is doing well.  rtc in 12 months.

## 2018-11-17 NOTE — Assessment & Plan Note (Signed)
Continue gemfibrozil, check lipids today.

## 2018-11-17 NOTE — Progress Notes (Signed)
   Subjective:    Patient ID: Jacob Ali, male    DOB: 08-02-1960, 58 y.o.   MRN: [id number]  HPI  58y.o. M HIV+September 2012. Hx of HTN, hyperlipidemia, hospitalization for tylenol overdose and cutting of wrists on 02/20/11. Patient reports adherence to Gemfibrozil, lisinopril/HCTZ. He underwent atherectomy(01-2016)L SFA, PTCA and stent of L SFA and popliteal arteries; and PTCA and stent of R common iliac. Has had f/u at vascular, 05-01-17 ABI Rt=0.87and Lt=0.84(previous ABI's Rt=0.91and Lt=0.93) Duplex ultrasound of theleft leg shows moderat restenosis of the stent in the SFA >50%. He has f/u Dec 2019. He is worried that this is worsening.  Feels that has been doing well. He is exercising. Last report was good (F/u 02-2019).  Prevtaking tivicay-descovy.Changed to biktarvy 11-2016. Alexa tells him to take his pills every morning at 7:30am.   Has been working at home, Art gallery manager.  It's like a blur... Wants to meet [facility] for injectable study.   HIV 1 RNA Quant (copies/mL)  Date Value  11/18/2017 <20 NOT DETECTED  02/04/2017 <20 NOT DETECTED  11/27/2016 <20 NOT DETECTED   CD4 (no units)  Date Value  11/24/2015 1,130  05/17/2015 1,550  09/30/2014 952   CD4 T Cell Abs (/uL)  Date Value  11/18/2017 1,300  02/04/2017 1,380  11/27/2016 1,780    Review of Systems  Constitutional: Negative for appetite change and unexpected weight change.  Respiratory: Negative for cough and shortness of breath.   Gastrointestinal: Negative for constipation and diarrhea.  Genitourinary: Negative for difficulty urinating.  smoking 1/2 ppd.  Please see HPI. All other systems reviewed and negative.     Objective:   Physical Exam Constitutional:      Appearance: Normal appearance.  Eyes:     Extraocular Movements: Extraocular movements intact.     Pupils: Pupils are equal, round, and reactive to light.  Neck:     Musculoskeletal: Normal range of motion and neck supple.   Cardiovascular:     Rate and Rhythm: Normal rate and regular rhythm.  Pulmonary:     Effort: Pulmonary effort is normal.     Breath sounds: Normal breath sounds.  Abdominal:     General: Bowel sounds are normal. There is no distension.     Palpations: Abdomen is soft.     Tenderness: There is no abdominal tenderness.  Musculoskeletal: Normal range of motion.        General: No swelling.     Right lower leg: No edema.     Left lower leg: No edema.  Neurological:     Mental Status: He is alert.       Assessment & Plan:

## 2018-11-17 NOTE — Assessment & Plan Note (Signed)
Wt significantly improved.  Will mark as resolved.

## 2018-11-17 NOTE — Assessment & Plan Note (Signed)
Encouraged to quit. 

## 2018-11-23 LAB — COMPREHENSIVE METABOLIC PANEL
AG Ratio: 2 (calc) (ref 1.0–2.5)
ALT: 54 U/L — ABNORMAL HIGH (ref 9–46)
AST: 33 U/L (ref 10–35)
Albumin: 4.7 g/dL (ref 3.6–5.1)
Alkaline phosphatase (APISO): 71 U/L (ref 35–144)
BUN: 18 mg/dL (ref 7–25)
CO2: 24 mmol/L (ref 20–32)
Calcium: 9.6 mg/dL (ref 8.6–10.3)
Chloride: 104 mmol/L (ref 98–110)
Creat: 1.12 mg/dL (ref 0.70–1.33)
Globulin: 2.3 g/dL (calc) (ref 1.9–3.7)
Glucose, Bld: 248 mg/dL — ABNORMAL HIGH (ref 65–99)
Potassium: 4.1 mmol/L (ref 3.5–5.3)
Sodium: 138 mmol/L (ref 135–146)
Total Bilirubin: 0.4 mg/dL (ref 0.2–1.2)
Total Protein: 7 g/dL (ref 6.1–8.1)

## 2018-11-23 LAB — CBC
HCT: 39.2 % (ref 38.5–50.0)
Hemoglobin: 13.8 g/dL (ref 13.2–17.1)
MCH: 34.2 pg — ABNORMAL HIGH (ref 27.0–33.0)
MCHC: 35.2 g/dL (ref 32.0–36.0)
MCV: 97.3 fL (ref 80.0–100.0)
MPV: 11.3 fL (ref 7.5–12.5)
Platelets: 286 10*3/uL (ref 140–400)
RBC: 4.03 10*6/uL — ABNORMAL LOW (ref 4.20–5.80)
RDW: 11.5 % (ref 11.0–15.0)
WBC: 9.8 10*3/uL (ref 3.8–10.8)

## 2018-11-23 LAB — HIV-1 RNA ULTRAQUANT REFLEX TO GENTYP+
HIV 1 RNA Quant: <20 copies/mL
HIV-1 RNA Quant, Log: <1.3 Log copies/mL

## 2018-11-23 LAB — RPR: RPR Ser Ql: NONREACTIVE

## 2018-12-17 ENCOUNTER — Telehealth: Payer: Self-pay | Admitting: *Deleted

## 2018-12-17 DIAGNOSIS — Z20822 Contact with and (suspected) exposure to covid-19: Secondary | ICD-10-CM

## 2018-12-17 NOTE — Telephone Encounter (Signed)
RN alerted by HR that pt called in sick for remote work today. Pt reports sx of nasal and chest congestion and productive cough x2 days. Began mid Tuesday 12/1. Denies fever, body aches, sinus pain/pressure, otalgia, sore throat. Sts fatigue is present but he is attributing to personal stressors at home. Denies known covid exposure.  Recommend covid testing and 10 day quarantine. Day 1 sx 12/16/18. Testing on Day 7 12/22/18. Given instructions for testing at [facility] site from 10a-3p. Quarantine release on 12/25/18 if negative test and sx improving with no fever for at least 24 hours prior without antipyretics. Next work day on-site 12/28/18.   Reviewed possible covid sx including ones to seek emergent/same day care for such as ShOB/difficulty breathing, bluish tint to lips/face, confusion, chest pain. Pt verbalizes understanding and agreement with plan of care. Denies further questions/concerns.

## 2018-12-17 NOTE — Telephone Encounter (Signed)
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

## 2018-12-22 ENCOUNTER — Other Ambulatory Visit: Payer: Self-pay

## 2018-12-22 DIAGNOSIS — Z20822 Contact with and (suspected) exposure to covid-19: Secondary | ICD-10-CM

## 2018-12-23 ENCOUNTER — Other Ambulatory Visit: Payer: Self-pay | Admitting: Infectious Diseases

## 2018-12-23 DIAGNOSIS — N529 Male erectile dysfunction, unspecified: Secondary | ICD-10-CM

## 2018-12-23 LAB — NOVEL CORONAVIRUS, NAA: SARS-CoV-2, NAA: NOT DETECTED

## 2018-12-29 NOTE — Telephone Encounter (Signed)
Did patient return to work as expected yesterday?

## 2018-12-29 NOTE — Telephone Encounter (Signed)
Contacted pt via email. He reports he did return to work on-site yesterday and is feeling well still. No further concerns. Encounter closed.

## 2018-12-30 NOTE — Telephone Encounter (Signed)
Noted patient has returned to work and symptom free

## 2019-01-12 ENCOUNTER — Telehealth: Payer: Self-pay | Admitting: Pharmacy Technician

## 2019-01-12 NOTE — Telephone Encounter (Signed)
[facility] Patient Advocate Encounter   Received notification from [employer] that prior authorization for [name]  is required due to cost exceeds max rejection.   PA submitted on 01/12/2019 Key [id number] Status is pending. Check status at [email or web address] using [id number], Jacob Ali dob 10/14/1960     [facility] will continue to follow. Patient was made aware of the timeline and the reason for medication shipment delay. He has about 9 tablets remaining as of today.  Bartholomew Crews, CPhT Specialty Pharmacy Patient [facility] Phone: [phone] Fax: [phone] 01/12/2019 2:42 PM

## 2019-01-13 NOTE — Telephone Encounter (Signed)
[facility] Patient Advocate Encounter  Prior Authorization for [name]  has been approved.    PA# [id number] Effective dates: 01/12/2019 through 01/11/2020  Patients co-pay is $100.00 (covered by [place] coupon)   Medication mailed out to arrive today 12/30, left voicemail to alert patient of his approval.  Bartholomew Crews, [place] Patient [facility] for [facility] Phone: [phone] Fax: [phone] 01/13/2019 8:42 AM

## 2019-02-12 ENCOUNTER — Telehealth: Payer: Self-pay

## 2019-02-12 NOTE — Telephone Encounter (Signed)
Patient is missing signature on FMLA. Will come back Monday early AM to sign. Will leave form with front desk. [place]

## 2019-02-12 NOTE — Telephone Encounter (Signed)
Patient came into office today to drop of FMLA paperwork for MD to sign. Patient would like forms mailed; provided envelope and stamp with original documents. Will place original forms in Md's box. Copy left in triage in accordion folder. [place]

## 2019-02-12 NOTE — Telephone Encounter (Signed)
Thanks

## 2019-02-14 ENCOUNTER — Other Ambulatory Visit (INDEPENDENT_AMBULATORY_CARE_PROVIDER_SITE_OTHER): Payer: Self-pay | Admitting: Vascular Surgery

## 2019-02-15 NOTE — Telephone Encounter (Signed)
Faxed paperwork to [phone]. Confirmation received. Will mail original copies to patient per his request. Aundria Rud, CMA

## 2019-03-04 ENCOUNTER — Ambulatory Visit (INDEPENDENT_AMBULATORY_CARE_PROVIDER_SITE_OTHER): Payer: No Typology Code available for payment source | Admitting: Vascular Surgery

## 2019-03-04 ENCOUNTER — Encounter (INDEPENDENT_AMBULATORY_CARE_PROVIDER_SITE_OTHER): Payer: PRIVATE HEALTH INSURANCE

## 2019-03-16 ENCOUNTER — Other Ambulatory Visit: Payer: Self-pay | Admitting: Infectious Diseases

## 2019-03-16 DIAGNOSIS — B20 Human immunodeficiency virus [HIV] disease: Secondary | ICD-10-CM

## 2019-04-05 ENCOUNTER — Ambulatory Visit (INDEPENDENT_AMBULATORY_CARE_PROVIDER_SITE_OTHER): Payer: No Typology Code available for payment source

## 2019-04-05 ENCOUNTER — Other Ambulatory Visit: Payer: Self-pay

## 2019-04-05 ENCOUNTER — Ambulatory Visit (INDEPENDENT_AMBULATORY_CARE_PROVIDER_SITE_OTHER): Payer: No Typology Code available for payment source | Admitting: Vascular Surgery

## 2019-04-05 DIAGNOSIS — I70219 Atherosclerosis of native arteries of extremities with intermittent claudication, unspecified extremity: Secondary | ICD-10-CM | POA: Diagnosis not present

## 2019-04-12 ENCOUNTER — Encounter (INDEPENDENT_AMBULATORY_CARE_PROVIDER_SITE_OTHER): Payer: Self-pay | Admitting: Vascular Surgery

## 2019-05-12 ENCOUNTER — Other Ambulatory Visit: Payer: Self-pay | Admitting: Primary Care

## 2019-05-12 DIAGNOSIS — I10 Essential (primary) hypertension: Secondary | ICD-10-CM

## 2019-05-12 DIAGNOSIS — Z125 Encounter for screening for malignant neoplasm of prostate: Secondary | ICD-10-CM

## 2019-05-12 DIAGNOSIS — E785 Hyperlipidemia, unspecified: Secondary | ICD-10-CM

## 2019-05-19 ENCOUNTER — Other Ambulatory Visit (INDEPENDENT_AMBULATORY_CARE_PROVIDER_SITE_OTHER): Payer: PRIVATE HEALTH INSURANCE

## 2019-05-19 DIAGNOSIS — Z125 Encounter for screening for malignant neoplasm of prostate: Secondary | ICD-10-CM | POA: Diagnosis not present

## 2019-05-19 DIAGNOSIS — E785 Hyperlipidemia, unspecified: Secondary | ICD-10-CM | POA: Diagnosis not present

## 2019-05-19 DIAGNOSIS — I10 Essential (primary) hypertension: Secondary | ICD-10-CM | POA: Diagnosis not present

## 2019-05-19 DIAGNOSIS — E1165 Type 2 diabetes mellitus with hyperglycemia: Secondary | ICD-10-CM

## 2019-05-19 LAB — BASIC METABOLIC PANEL
BUN: 17 mg/dL (ref 6–23)
CO2: 26 mEq/L (ref 19–32)
Calcium: 9.4 mg/dL (ref 8.4–10.5)
Chloride: 100 mEq/L (ref 96–112)
Creatinine, Ser: 1.01 mg/dL (ref 0.40–1.50)
GFR: 75.23 mL/min (ref >60.00)
Glucose, Bld: 319 mg/dL — ABNORMAL HIGH (ref 70–99)
Potassium: 4.3 mEq/L (ref 3.5–5.1)
Sodium: 133 mEq/L — ABNORMAL LOW (ref 135–145)

## 2019-05-19 LAB — LIPID PANEL
Cholesterol: 212 mg/dL — ABNORMAL HIGH (ref 0–200)
HDL: 26.8 mg/dL — ABNORMAL LOW (ref >39.00)
NonHDL: 184.78
Total CHOL/HDL Ratio: 8
Triglycerides: 247 mg/dL — ABNORMAL HIGH (ref 0.0–149.0)
VLDL: 49.4 mg/dL — ABNORMAL HIGH (ref 0.0–40.0)

## 2019-05-19 LAB — HEMOGLOBIN A1C: Hgb A1c MFr Bld: 8.4 % — ABNORMAL HIGH (ref 4.6–6.5)

## 2019-05-19 LAB — PSA: PSA: 0.65 ng/mL (ref 0.10–4.00)

## 2019-05-19 LAB — LDL CHOLESTEROL, DIRECT: Direct LDL: 134 mg/dL

## 2019-05-21 ENCOUNTER — Other Ambulatory Visit: Payer: Self-pay | Admitting: Family Medicine

## 2019-05-21 MED ORDER — METFORMIN HCL ER 500 MG PO TB24: 500.0000 mg | ORAL_TABLET | Freq: Every day | ORAL | 2 refills | Status: DC

## 2019-05-21 NOTE — Telephone Encounter (Signed)
Not sure what dose of Metformin patient needs to start on based on recent lab results. Please review

## 2019-05-26 ENCOUNTER — Encounter: Payer: Self-pay | Admitting: Primary Care

## 2019-05-26 ENCOUNTER — Ambulatory Visit (INDEPENDENT_AMBULATORY_CARE_PROVIDER_SITE_OTHER): Payer: PRIVATE HEALTH INSURANCE | Admitting: Primary Care

## 2019-05-26 ENCOUNTER — Other Ambulatory Visit: Payer: Self-pay

## 2019-05-26 VITALS — BP 130/80 | HR 87 | Temp 96.7°F | Ht 69.0 in | Wt 178.0 lb

## 2019-05-26 DIAGNOSIS — I70219 Atherosclerosis of native arteries of extremities with intermittent claudication, unspecified extremity: Secondary | ICD-10-CM

## 2019-05-26 DIAGNOSIS — D126 Benign neoplasm of colon, unspecified: Secondary | ICD-10-CM

## 2019-05-26 DIAGNOSIS — B20 Human immunodeficiency virus [HIV] disease: Secondary | ICD-10-CM

## 2019-05-26 DIAGNOSIS — K219 Gastro-esophageal reflux disease without esophagitis: Secondary | ICD-10-CM

## 2019-05-26 DIAGNOSIS — N529 Male erectile dysfunction, unspecified: Secondary | ICD-10-CM

## 2019-05-26 DIAGNOSIS — I10 Essential (primary) hypertension: Secondary | ICD-10-CM

## 2019-05-26 DIAGNOSIS — E785 Hyperlipidemia, unspecified: Secondary | ICD-10-CM

## 2019-05-26 DIAGNOSIS — Z0000 Encounter for general adult medical examination without abnormal findings: Secondary | ICD-10-CM | POA: Diagnosis not present

## 2019-05-26 DIAGNOSIS — E119 Type 2 diabetes mellitus without complications: Secondary | ICD-10-CM

## 2019-05-26 DIAGNOSIS — Z23 Encounter for immunization: Secondary | ICD-10-CM

## 2019-05-26 DIAGNOSIS — F411 Generalized anxiety disorder: Secondary | ICD-10-CM

## 2019-05-26 DIAGNOSIS — Z1211 Encounter for screening for malignant neoplasm of colon: Secondary | ICD-10-CM | POA: Insufficient documentation

## 2019-05-26 MED ORDER — ROSUVASTATIN CALCIUM 10 MG PO TABS: 10.0000 mg | ORAL_TABLET | Freq: Every evening | ORAL | 3 refills | Status: DC

## 2019-05-26 MED ORDER — BLOOD GLUCOSE MONITOR KIT: PACK | 0 refills | Status: DC

## 2019-05-26 NOTE — Assessment & Plan Note (Signed)
Following with ID, last office visit reviewed. HIV levels undetectable. Continue Biktarvy.

## 2019-05-26 NOTE — Progress Notes (Signed)
Subjective:    Patient ID: Jacob Ali, male    DOB: [birth year], 59 y.o.   MRN: [id number]  HPI  This visit occurred during the SARS-CoV-2 public health emergency.  Safety protocols were in place, including screening questions prior to the visit, additional usage of staff PPE, and extensive cleaning of exam room while observing appropriate contact time as indicated for disinfecting solutions.   Jacob Ali is a 59 year old male who presents today for complete physical. He has not been seen in our office since June 2018.  Immunizations: -Tetanus: Completed in 2014 -Influenza: Completed last season -Shingles: Never completed, has had Shingles  -Pneumonia: Completed Prevnar in 2019, Pneumovax in 2020 -Covid-19: Completed series   Diet: He endorses a poor diet up until just recently. He recently started changing his diet. Drinking soda, sweet tea and eating sweets.  Exercise: He is not exercising, is active at work.   Eye exam: No recent exam. Dental exam: Completes semi-annually   Colonoscopy: Completed in 2019, due in 2024 PSA: 0.65 in 2021 Hep C Screen: Negative  BP Readings from Last 3 Encounters:  05/26/19 130/80  08/31/18 130/72  12/15/17 116/66   The 10-year ASCVD risk score Mikey Bussing DC Jr., et al., 2013) is: 43.8%   Values used to calculate the score:     Age: 67 years     Sex: Male     Is Non-Hispanic African American: No     Diabetic: Yes     Tobacco smoker: Yes     Systolic Blood Pressure: [id number] mmHg     Is BP treated: Yes     HDL Cholesterol: 26.8 mg/dL     Total Cholesterol: 212 mg/dL   Review of Systems  Constitutional: Negative for unexpected weight change.  HENT: Negative for rhinorrhea.   Respiratory: Negative for cough and shortness of breath.   Cardiovascular: Negative for chest pain.  Gastrointestinal: Negative for constipation and diarrhea.  Genitourinary: Negative for difficulty urinating.  Musculoskeletal: Positive for arthralgias.       Chronic  right shoulder pain  Skin: Negative for rash.  Allergic/Immunologic: Negative for environmental allergies.  Neurological: Negative for dizziness, numbness and headaches.  Psychiatric/Behavioral: The patient is not nervous/anxious.        Past Medical History:  Diagnosis Date  . Depression   . GERD (gastroesophageal reflux disease)   . HIV infection ([place])   . Hyperlipidemia   . Hypertension   . Peripheral vascular disease ([place])   . PONV (postoperative nausea and vomiting)   . Sleep apnea   . Tubular adenoma of colon 11/03/2017   Colonoscopy Sept 9, 2019     Social History   Socioeconomic History  . Marital status: Single    Spouse name: Not on file  . Number of children: Not on file  . Years of education: 78  . Highest education level: Not on file  Occupational History  . Occupation: Recruitment consultant: [employer]  Tobacco Use  . Smoking status: Current Every Day Smoker    Packs/day: 0.50    Years: 40.00    Pack years: 20.00    Types: Cigars  . Smokeless tobacco: Never Used  . Tobacco comment: 1/2PPD  Substance and Sexual Activity  . Alcohol use: Yes    Alcohol/week: 3.0 standard drinks    Types: 3 Standard drinks or equivalent per week    Comment: 2-3 times a week  . Drug use: Yes    Frequency:  2.0 times per week    Types: Marijuana  . Sexual activity: Not Currently    Partners: Male    Birth control/protection: Condom    Comment: pt. given condoms  Other Topics Concern  . Not on file  Social History Narrative   Single, lives alone.     Social Determinants of Health   Financial Resource Strain:   . Difficulty of Paying Living Expenses:   Food Insecurity:   . Worried About Charity fundraiser in the Last Year:   . Arboriculturist in the Last Year:   Transportation Needs:   . Film/video editor (Medical):   Marland Kitchen Lack of Transportation (Non-Medical):   Physical Activity:   . Days of Exercise per Week:   . Minutes of Exercise per  Session:   Stress:   . Feeling of Stress :   Social Connections:   . Frequency of Communication with Friends and Family:   . Frequency of Social Gatherings with Friends and Family:   . Attends Religious Services:   . Active Member of Clubs or Organizations:   . Attends Archivist Meetings:   Marland Kitchen Marital Status:   Intimate Partner Violence:   . Fear of Current or Ex-Partner:   . Emotionally Abused:   Marland Kitchen Physically Abused:   . Sexually Abused:     Past Surgical History:  Procedure Laterality Date  . Examination under anesthesia, repair of anal fissure,  12/04/2000  . PERIPHERAL VASCULAR CATHETERIZATION Left 02/06/2016   Procedure: Lower Extremity Angiography;  Surgeon: Katha Cabal, MD;  Location: [facility];  Service: Cardiovascular;  Laterality: Left;  . PERIPHERAL VASCULAR CATHETERIZATION N/A 02/06/2016   Procedure: Abdominal Aortogram w/Lower Extremity;  Surgeon: Katha Cabal, MD;  Location: [facility];  Service: Cardiovascular;  Laterality: N/A;  . PERIPHERAL VASCULAR CATHETERIZATION  02/06/2016   Procedure: Lower Extremity Intervention;  Surgeon: Katha Cabal, MD;  Location: [facility];  Service: Cardiovascular;;    Family History  Problem Relation Age of Onset  . Throat cancer Father   . Heart disease Father   . Cancer Father        bladder  . Cancer Maternal Grandmother        colon    Allergies  Allergen Reactions  . Tamiflu [Oseltamivir Phosphate] Other (See Comments)    Depressive symptoms    Current Outpatient Medications on File Prior to Visit  Medication Sig Dispense Refill  . aspirin (ASPIRIN CHILDRENS) 81 MG chewable tablet Chew 1 tablet (81 mg total) by mouth daily. Take with food 90 tablet 6  . BIKTARVY 50-200-25 MG TABS tablet TAKE 1 TABLET BY MOUTH DAILY. 30 tablet 5  . clopidogrel (PLAVIX) 75 MG tablet TAKE 1 TABLET BY MOUTH EVERY DAY 90 tablet 2  . fluticasone (FLONASE) 50 MCG/ACT nasal spray Place 1  spray into both nostrils 2 (two) times daily. 16 g 6  . ibuprofen (ADVIL) 800 MG tablet Take 1 tablet (800 mg total) by mouth daily as needed for moderate pain. 90 tablet 2  . lisinopril-hydrochlorothiazide (ZESTORETIC) 20-25 MG tablet Take 1 tablet by mouth daily. 90 tablet 2  . loratadine (CLARITIN) 10 MG tablet TAKE 1 TABLET BY MOUTH EVERY DAY 90 tablet 3  . metFORMIN (GLUCOPHAGE XR) 500 MG 24 hr tablet Take 1 tablet (500 mg total) by mouth daily with breakfast. 30 tablet 2  . pantoprazole (PROTONIX) 40 MG tablet TAKE 1 TABLET BY MOUTH EVERY  DAY 90 tablet 2  . tadalafil (CIALIS) 5 MG tablet TAKE 1 TABLET EVERY DAY AS NEEDED FOR ERECTILE DYSFUNCTION 10 tablet 1   No current facility-administered medications on file prior to visit.    BP 130/80   Pulse 87   Temp (!) 96.7 F (35.9 C) (Temporal)   Ht 5\' 9"  (1.753 m)   Wt 178 lb (80.7 kg)   SpO2 98%   BMI 26.29 kg/m    Objective:   Physical Exam  Constitutional: He is oriented to person, place, and time. He appears well-nourished.  HENT:  Right Ear: Tympanic membrane and ear canal normal.  Left Ear: Tympanic membrane and ear canal normal.  Mouth/Throat: Oropharynx is clear and moist.  Eyes: Pupils are equal, round, and reactive to light. EOM are normal.  Cardiovascular: Normal rate and regular rhythm.  Respiratory: Effort normal and breath sounds normal.  GI: Soft. Bowel sounds are normal. There is no abdominal tenderness.  Musculoskeletal:        General: Normal range of motion.     Cervical back: Neck supple.  Neurological: He is alert and oriented to person, place, and time. No cranial nerve deficit.  Reflex Scores:      Patellar reflexes are 2+ on the right side and 2+ on the left side. Skin: Skin is warm and dry.  Psychiatric: He has a normal mood and affect.           Assessment & Plan:

## 2019-05-26 NOTE — Patient Instructions (Signed)
Start exercising. You should be getting 150 minutes of moderate intensity exercise weekly.  Continue to work on a healthy diet. Ensure you are consuming 64 ounces of water daily.  Schedule a nurse visit and lab appointment for your second Shingles vaccine and repeat A1C test.  Please schedule a follow up appointment in 6 months for diabetes check.   It was a pleasure to see you today!   Preventive Care 36-61 Years Old, Male Preventive care refers to lifestyle choices and visits with your health care provider that can promote health and wellness. This includes:  A yearly physical exam. This is also called an annual well check.  Regular dental and eye exams.  Immunizations.  Screening for certain conditions.  Healthy lifestyle choices, such as eating a healthy diet, getting regular exercise, not using drugs or products that contain nicotine and tobacco, and limiting alcohol use. What can I expect for my preventive care visit? Physical exam Your health care provider will check:  Height and weight. These may be used to calculate body mass index (BMI), which is a measurement that tells if you are at a healthy weight.  Heart rate and blood pressure.  Your skin for abnormal spots. Counseling Your health care provider may ask you questions about:  Alcohol, tobacco, and drug use.  Emotional well-being.  Home and relationship well-being.  Sexual activity.  Eating habits.  Work and work Statistician. What immunizations do I need?  Influenza (flu) vaccine  This is recommended every year. Tetanus, diphtheria, and pertussis (Tdap) vaccine  You may need a Td booster every 10 years. Varicella (chickenpox) vaccine  You may need this vaccine if you have not already been vaccinated. Zoster (shingles) vaccine  You may need this after age 61. Measles, mumps, and rubella (MMR) vaccine  You may need at least one dose of MMR if you were born in 1957 or later. You may also need a  second dose. Pneumococcal conjugate (PCV13) vaccine  You may need this if you have certain conditions and were not previously vaccinated. Pneumococcal polysaccharide (PPSV23) vaccine  You may need one or two doses if you smoke cigarettes or if you have certain conditions. Meningococcal conjugate (MenACWY) vaccine  You may need this if you have certain conditions. Hepatitis A vaccine  You may need this if you have certain conditions or if you travel or work in places where you may be exposed to hepatitis A. Hepatitis B vaccine  You may need this if you have certain conditions or if you travel or work in places where you may be exposed to hepatitis B. Haemophilus influenzae type b (Hib) vaccine  You may need this if you have certain risk factors. Human papillomavirus (HPV) vaccine  If recommended by your health care provider, you may need three doses over 6 months. You may receive vaccines as individual doses or as more than one vaccine together in one shot (combination vaccines). Talk with your health care provider about the risks and benefits of combination vaccines. What tests do I need? Blood tests  Lipid and cholesterol levels. These may be checked every 5 years, or more frequently if you are over 26 years old.  Hepatitis C test.  Hepatitis B test. Screening  Lung cancer screening. You may have this screening every year starting at age 69 if you have a 30-pack-year history of smoking and currently smoke or have quit within the past 15 years.  Prostate cancer screening. Recommendations will vary depending on your family history  and other risks.  Colorectal cancer screening. All adults should have this screening starting at age 41 and continuing until age 72. Your health care provider may recommend screening at age 41 if you are at increased risk. You will have tests every 1-10 years, depending on your results and the type of screening test.  Diabetes screening. This is done  by checking your blood sugar (glucose) after you have not eaten for a while (fasting). You may have this done every 1-3 years.  Sexually transmitted disease (STD) testing. Follow these instructions at home: Eating and drinking  Eat a diet that includes fresh fruits and vegetables, whole grains, lean protein, and low-fat dairy products.  Take vitamin and mineral supplements as recommended by your health care provider.  Do not drink alcohol if your health care provider tells you not to drink.  If you drink alcohol: ? Limit how much you have to 0-2 drinks a day. ? Be aware of how much alcohol is in your drink. In the [place] one drink equals one 12 oz bottle of beer (355 mL), one 5 oz glass of wine (148 mL), or one 1 oz glass of hard liquor (44 mL). Lifestyle  Take daily care of your teeth and gums.  Stay active. Exercise for at least 30 minutes on 5 or more days each week.  Do not use any products that contain nicotine or tobacco, such as cigarettes, e-cigarettes, and chewing tobacco. If you need help quitting, ask your health care provider.  If you are sexually active, practice safe sex. Use a condom or other form of protection to prevent STIs (sexually transmitted infections).  Talk with your health care provider about taking a low-dose aspirin every day starting at age 81. What's next?  Go to your health care provider once a year for a well check visit.  Ask your health care provider how often you should have your eyes and teeth checked.  Stay up to date on all vaccines. This information is not intended to replace advice given to you by your health care provider. Make sure you discuss any questions you have with your health care provider. Document Revised: 12/25/2017 Document Reviewed: 12/25/2017 Elsevier Patient Education  2020 [employer].

## 2019-05-26 NOTE — Assessment & Plan Note (Signed)
Controlled in the office today, continue lisinopril-HCTZ.  BMP reviewed.

## 2019-05-26 NOTE — Assessment & Plan Note (Signed)
LDL above goal with ASCVD risk score of 43%. Discontinue gemfibrozil. Start Crestor 10 mg.  Repeat lipids in 2-3 months.  Continue clopidogrel.

## 2019-05-26 NOTE — Assessment & Plan Note (Signed)
A1C of 8.4 on recent labs, compliant to Metformin XR 500 mg.   He is very motivated to work on his diet and weight loss.  Initiated statin therapy today. Managed on ACE. Foot exam today. Discussed to schedule eye exam.  Repeat A1C in 3 months, follow up in 6 months.

## 2019-05-26 NOTE — Addendum Note (Signed)
Addended by: Jacqualin Combes on: 05/26/2019 03:20 PM   Modules accepted: Orders

## 2019-05-26 NOTE — Assessment & Plan Note (Signed)
Denies concerns for depression and anxiety.

## 2019-05-26 NOTE — Assessment & Plan Note (Signed)
Doing well on PRN cialis. Continue same.

## 2019-05-26 NOTE — Assessment & Plan Note (Signed)
Repeat colonoscopy due in 2024. °

## 2019-05-26 NOTE — Assessment & Plan Note (Signed)
Shingrix due today for first dose. Other immunizations UTD. Colonoscopy UTD. PSA UTD. Encouraged a healthy diet and regular exercise.  Exam today unremarkable. Labs reviewed.

## 2019-05-26 NOTE — Assessment & Plan Note (Addendum)
Compliant to clopidogrel, will initiate statin therapy. Repeat lipids in 2-3 months.

## 2019-05-26 NOTE — Assessment & Plan Note (Signed)
Well controlled on pantoprazole 40 mg, cannot tolerate lower doses. Continue same.

## 2019-05-27 ENCOUNTER — Telehealth: Payer: Self-pay | Admitting: *Deleted

## 2019-05-27 DIAGNOSIS — E785 Hyperlipidemia, unspecified: Secondary | ICD-10-CM

## 2019-05-27 MED ORDER — ATORVASTATIN CALCIUM 20 MG PO TABS: 20.0000 mg | ORAL_TABLET | Freq: Every evening | ORAL | 3 refills | Status: DC

## 2019-05-27 NOTE — Telephone Encounter (Addendum)
Hi Haley! Thank you for the information, we will change his prescription from rosuvastatin to atorvastatin 20 mg. I will enter the Rx as requested for [facility].

## 2019-05-27 NOTE — Telephone Encounter (Signed)
Good afternoon. [name] brought me a Rx to have filled through our dispensary for Crestor that Anda Kraft updated him to from Gemfibrozil recently.  We do not carry Crestor, but do have Atorvastatin 20mg  and 40mg , Simvastatin 20mg  and 40mg , and Pravastatin 40mg . In order to provide this at no cost to the pt, would Anda Kraft be amenable to trying one of the other statins available here instead? If so, could a new Rx be entered with "[facility]" as the pharmacy. The printed Rx that it will default to can be shredded as I will view in [facility].  Thank you!

## 2019-05-27 NOTE — Telephone Encounter (Signed)
Thank you! Received. Have a good day.

## 2019-06-20 ENCOUNTER — Other Ambulatory Visit: Payer: Self-pay | Admitting: Primary Care

## 2019-08-01 ENCOUNTER — Other Ambulatory Visit: Payer: Self-pay | Admitting: Primary Care

## 2019-08-01 DIAGNOSIS — E785 Hyperlipidemia, unspecified: Secondary | ICD-10-CM

## 2019-08-01 DIAGNOSIS — E119 Type 2 diabetes mellitus without complications: Secondary | ICD-10-CM

## 2019-08-13 ENCOUNTER — Other Ambulatory Visit: Payer: Self-pay | Admitting: Family Medicine

## 2019-08-13 DIAGNOSIS — E119 Type 2 diabetes mellitus without complications: Secondary | ICD-10-CM

## 2019-08-13 NOTE — Telephone Encounter (Signed)
Last prescribed on 05/21/2019 by Tor Netters Last OV (cpe ) with Allie Bossier on 05/26/2019 No future OV scheduled

## 2019-08-13 NOTE — Telephone Encounter (Signed)
Refills sent to pharmacy. 

## 2019-08-24 ENCOUNTER — Other Ambulatory Visit: Payer: Self-pay | Admitting: *Deleted

## 2019-08-24 DIAGNOSIS — I10 Essential (primary) hypertension: Secondary | ICD-10-CM

## 2019-08-24 DIAGNOSIS — M1991 Primary osteoarthritis, unspecified site: Secondary | ICD-10-CM

## 2019-08-24 MED ORDER — IBUPROFEN 800 MG PO TABS: 800.0000 mg | ORAL_TABLET | Freq: Every day | ORAL | 2 refills | Status: DC | PRN

## 2019-08-24 MED ORDER — LISINOPRIL-HYDROCHLOROTHIAZIDE 20-25 MG PO TABS: 1.0000 | ORAL_TABLET | Freq: Every day | ORAL | 2 refills | Status: DC

## 2019-08-24 NOTE — Telephone Encounter (Signed)
Chart reviewed. Refilled.

## 2019-08-24 NOTE — Telephone Encounter (Signed)
Got the new Rx's x2 pulled. Thank you!

## 2019-08-24 NOTE — Telephone Encounter (Signed)
Good afternoon. Jacob Ali reported that his chronic med management has been handed off from Jacob Ali to Jacob Kraft, NP since he established care back in May this year.   He is requesting a refill on Lisinopril/HCTZ 20/25mg  po daily and Ibuprofen 800mg  po daily prn moderate pain but is out of refills.   If appropriate, could new Rx's be entered with "[facility]" as the pharmacy. The printed Rx that it will default to can be shredded as I will view in [facility].  Thank you!

## 2019-08-24 NOTE — Telephone Encounter (Signed)
These will need to be reviewed by Anda Kraft since neither have been prescribed by Anda Kraft in the past.  Pharmacy has been changed.   Last OV       05/26/19 Next OV        12/01/19

## 2019-08-27 ENCOUNTER — Telehealth: Payer: Self-pay | Admitting: Primary Care

## 2019-08-27 NOTE — Telephone Encounter (Signed)
Called patient to clarify shingrix immunization and lab work for Monday. Patient stating he can have 2nd immunization when he has follow up with PCP. Patient is scheduled in November but thought he was supposed to see PCP shortly after labs on 8/16. Did see note for 6 month follow up but not sooner. Patient is wanting to know if PCP is wanting to see him to go over labs and to do shot at the follow up visit. Notified patient message would be sent and to see what can happen, but Anda Kraft is out of office till 18th. Patient wanting to know asap as he'd like to have labs and shot done Monday. Please advise.

## 2019-08-30 ENCOUNTER — Other Ambulatory Visit (INDEPENDENT_AMBULATORY_CARE_PROVIDER_SITE_OTHER): Payer: PRIVATE HEALTH INSURANCE

## 2019-08-30 ENCOUNTER — Other Ambulatory Visit: Payer: PRIVATE HEALTH INSURANCE

## 2019-08-30 ENCOUNTER — Ambulatory Visit (INDEPENDENT_AMBULATORY_CARE_PROVIDER_SITE_OTHER): Payer: PRIVATE HEALTH INSURANCE | Admitting: *Deleted

## 2019-08-30 ENCOUNTER — Other Ambulatory Visit: Payer: Self-pay

## 2019-08-30 DIAGNOSIS — Z23 Encounter for immunization: Secondary | ICD-10-CM | POA: Diagnosis not present

## 2019-08-30 DIAGNOSIS — E785 Hyperlipidemia, unspecified: Secondary | ICD-10-CM

## 2019-08-30 DIAGNOSIS — E119 Type 2 diabetes mellitus without complications: Secondary | ICD-10-CM

## 2019-08-30 LAB — LIPID PANEL
Cholesterol: 134 mg/dL (ref 0–200)
HDL: 30.4 mg/dL — ABNORMAL LOW (ref >39.00)
LDL Cholesterol: 86 mg/dL (ref 0–99)
NonHDL: 103.5
Total CHOL/HDL Ratio: 4
Triglycerides: 89 mg/dL (ref 0.0–149.0)
VLDL: 17.8 mg/dL (ref 0.0–40.0)

## 2019-08-30 NOTE — Telephone Encounter (Signed)
Anda Kraft is out of the office, but if appropriate - can we add him on to the nurse visit list for today for Shingrix shot?   Anda Kraft will let him know if he needs to see her sooner.

## 2019-08-30 NOTE — Telephone Encounter (Signed)
Patient received shot at 8/16 at lab appointment. Please advise if needed to see sooner.

## 2019-08-31 LAB — HEMOGLOBIN A1C
Hgb A1c MFr Bld: 4.3 % of total Hgb (ref <5.7)
Mean Plasma Glucose: 77 (calc)
eAG (mmol/L): 4.2 (calc)

## 2019-09-01 NOTE — Telephone Encounter (Signed)
Spoken and notified patient of Jacob Ali comments. Verbalized understanding.

## 2019-09-01 NOTE — Telephone Encounter (Signed)
Please notify patient that we will see him in November as scheduled. No need to come in any sooner. Congratulate him on his significant improvement with diabetes, no longer in the diabetic range and can stop Metformin.

## 2019-09-01 NOTE — Telephone Encounter (Signed)
Per DPR, left detail message of Jacob Ali comments.

## 2019-09-01 NOTE — Telephone Encounter (Signed)
Pt returned your call and he has a question about stopping the Metformin. He would like a call back.

## 2019-09-07 ENCOUNTER — Other Ambulatory Visit: Payer: Self-pay | Admitting: Infectious Diseases

## 2019-09-07 DIAGNOSIS — B20 Human immunodeficiency virus [HIV] disease: Secondary | ICD-10-CM

## 2019-10-05 ENCOUNTER — Other Ambulatory Visit: Payer: Self-pay | Admitting: Infectious Diseases

## 2019-10-05 ENCOUNTER — Other Ambulatory Visit (INDEPENDENT_AMBULATORY_CARE_PROVIDER_SITE_OTHER): Payer: Self-pay | Admitting: Vascular Surgery

## 2019-10-05 DIAGNOSIS — K219 Gastro-esophageal reflux disease without esophagitis: Secondary | ICD-10-CM

## 2019-10-05 NOTE — Telephone Encounter (Signed)
The pt was last seen a year ago by Jacob Ali an was told to continue taking medication it this ok to refill?

## 2019-10-07 ENCOUNTER — Other Ambulatory Visit (INDEPENDENT_AMBULATORY_CARE_PROVIDER_SITE_OTHER): Payer: Self-pay | Admitting: Vascular Surgery

## 2019-10-07 DIAGNOSIS — I739 Peripheral vascular disease, unspecified: Secondary | ICD-10-CM

## 2019-10-11 ENCOUNTER — Ambulatory Visit (INDEPENDENT_AMBULATORY_CARE_PROVIDER_SITE_OTHER): Payer: No Typology Code available for payment source | Admitting: Vascular Surgery

## 2019-10-11 ENCOUNTER — Other Ambulatory Visit (INDEPENDENT_AMBULATORY_CARE_PROVIDER_SITE_OTHER): Payer: Self-pay | Admitting: Vascular Surgery

## 2019-10-11 ENCOUNTER — Ambulatory Visit (INDEPENDENT_AMBULATORY_CARE_PROVIDER_SITE_OTHER): Payer: No Typology Code available for payment source

## 2019-10-11 ENCOUNTER — Other Ambulatory Visit: Payer: Self-pay

## 2019-10-11 ENCOUNTER — Encounter (INDEPENDENT_AMBULATORY_CARE_PROVIDER_SITE_OTHER): Payer: No Typology Code available for payment source

## 2019-10-11 DIAGNOSIS — I739 Peripheral vascular disease, unspecified: Secondary | ICD-10-CM | POA: Diagnosis not present

## 2019-11-03 ENCOUNTER — Other Ambulatory Visit: Payer: PRIVATE HEALTH INSURANCE

## 2019-11-03 ENCOUNTER — Other Ambulatory Visit: Payer: Self-pay

## 2019-11-03 DIAGNOSIS — B20 Human immunodeficiency virus [HIV] disease: Secondary | ICD-10-CM

## 2019-11-03 DIAGNOSIS — E782 Mixed hyperlipidemia: Secondary | ICD-10-CM

## 2019-11-03 DIAGNOSIS — Z113 Encounter for screening for infections with a predominantly sexual mode of transmission: Secondary | ICD-10-CM

## 2019-11-04 LAB — T-HELPER CELL (CD4) - (RCID CLINIC ONLY)
CD4 % Helper T Cell: 43 % (ref 33–65)
CD4 T Cell Abs: 1530 /uL (ref 400–1790)

## 2019-11-05 ENCOUNTER — Other Ambulatory Visit: Payer: Self-pay | Admitting: Primary Care

## 2019-11-05 DIAGNOSIS — E119 Type 2 diabetes mellitus without complications: Secondary | ICD-10-CM

## 2019-11-06 LAB — COMPREHENSIVE METABOLIC PANEL
AG Ratio: 2 (calc) (ref 1.0–2.5)
ALT: 12 U/L (ref 9–46)
AST: 14 U/L (ref 10–35)
Albumin: 4.4 g/dL (ref 3.6–5.1)
Alkaline phosphatase (APISO): 55 U/L (ref 35–144)
BUN: 19 mg/dL (ref 7–25)
CO2: 25 mmol/L (ref 20–32)
Calcium: 10.1 mg/dL (ref 8.6–10.3)
Chloride: 106 mmol/L (ref 98–110)
Creat: 1.08 mg/dL (ref 0.70–1.25)
Globulin: 2.2 g/dL (calc) (ref 1.9–3.7)
Glucose, Bld: 106 mg/dL — ABNORMAL HIGH (ref 65–99)
Potassium: 3.8 mmol/L (ref 3.5–5.3)
Sodium: 139 mmol/L (ref 135–146)
Total Bilirubin: 0.5 mg/dL (ref 0.2–1.2)
Total Protein: 6.6 g/dL (ref 6.1–8.1)

## 2019-11-06 LAB — CBC
HCT: 41.6 % (ref 38.5–50.0)
Hemoglobin: 14.1 g/dL (ref 13.2–17.1)
MCH: 33.1 pg — ABNORMAL HIGH (ref 27.0–33.0)
MCHC: 33.9 g/dL (ref 32.0–36.0)
MCV: 97.7 fL (ref 80.0–100.0)
MPV: 10.9 fL (ref 7.5–12.5)
Platelets: 226 10*3/uL (ref 140–400)
RBC: 4.26 10*6/uL (ref 4.20–5.80)
RDW: 11.8 % (ref 11.0–15.0)
WBC: 10.4 10*3/uL (ref 3.8–10.8)

## 2019-11-06 LAB — LIPID PANEL
Cholesterol: 154 mg/dL (ref <200)
HDL: 30 mg/dL — ABNORMAL LOW (ref >=40)
LDL Cholesterol (Calc): 86 mg/dL (calc)
Non-HDL Cholesterol (Calc): 124 mg/dL (calc) (ref <130)
Total CHOL/HDL Ratio: 5.1 (calc) — ABNORMAL HIGH (ref <5.0)
Triglycerides: 292 mg/dL — ABNORMAL HIGH (ref <150)

## 2019-11-06 LAB — HIV-1 RNA QUANT-NO REFLEX-BLD
HIV 1 RNA Quant: 29 Copies/mL — ABNORMAL HIGH
HIV-1 RNA Quant, Log: 1.45 Log cps/mL — ABNORMAL HIGH

## 2019-11-06 LAB — RPR: RPR Ser Ql: NONREACTIVE

## 2019-11-18 ENCOUNTER — Encounter: Payer: No Typology Code available for payment source | Admitting: Infectious Diseases

## 2019-11-23 ENCOUNTER — Encounter: Payer: Self-pay | Admitting: Infectious Diseases

## 2019-11-23 ENCOUNTER — Other Ambulatory Visit: Payer: Self-pay

## 2019-11-23 ENCOUNTER — Ambulatory Visit (INDEPENDENT_AMBULATORY_CARE_PROVIDER_SITE_OTHER): Payer: PRIVATE HEALTH INSURANCE | Admitting: Infectious Diseases

## 2019-11-23 VITALS — BP 181/77 | HR 81 | Wt 174.6 lb

## 2019-11-23 DIAGNOSIS — Z113 Encounter for screening for infections with a predominantly sexual mode of transmission: Secondary | ICD-10-CM

## 2019-11-23 DIAGNOSIS — E785 Hyperlipidemia, unspecified: Secondary | ICD-10-CM | POA: Diagnosis not present

## 2019-11-23 DIAGNOSIS — B20 Human immunodeficiency virus [HIV] disease: Secondary | ICD-10-CM

## 2019-11-23 DIAGNOSIS — Z79899 Other long term (current) drug therapy: Secondary | ICD-10-CM

## 2019-11-23 DIAGNOSIS — I70219 Atherosclerosis of native arteries of extremities with intermittent claudication, unspecified extremity: Secondary | ICD-10-CM

## 2019-11-23 NOTE — Assessment & Plan Note (Signed)
Appears to be stable.  I've sent a note to his Vasc surgeon to see if they can f/u with him.

## 2019-11-23 NOTE — Assessment & Plan Note (Addendum)
He is doing well I will refill his FMLA 01-2020 Continue on biktarvy Offered/refused condoms.  rtc in 1 year His vax are up to date. He will get a COVID booster soon at [facility].

## 2019-11-23 NOTE — Progress Notes (Signed)
   Subjective:    Patient ID: Jacob Ali, male    DOB: Oct 18, 1960, 59 y.o.   MRN: [id number]  HPI 58oy.o. M HIV+September 2012. Hx of HTN, hyperlipidemia, hospitalization for tylenol overdose and cutting of wrists on 02/20/11.  He underwent atherectomy(01-2016)L SFA, PTCA and stent of L SFA and popliteal arteries; and PTCA and stent of R common iliac. 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 He states his f/u since have been good.  Review of his ABI 09-2019 show slight decrease on L from prior (03-2019).  Prevtaking tivicay-descovy.Changed to biktarvy 11-2016.Alexa tells him to take his pills every morning at 7:30am.  Was seen by his PCP 05-2019 and started on crestor for his increased Cholesterol.   Over last 5 months his mother has had multiple health issues (fluid overload, bowel impaction). She is now in [facility] for rehab. Still mentally sharp. Needs more help from his family.  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   HIV 1 RNA Quant  Date Value  11/03/2019 29 Copies/mL (H)  11/17/2018 <20 NOT DETECTED copies/mL  11/18/2017 <20 NOT DETECTED copies/mL   CD4 (no units)  Date Value  11/24/2015 1,130  05/17/2015 1,550  09/30/2014 952   CD4 T Cell Abs (/uL)  Date Value  11/03/2019 1,530  11/18/2017 1,300  02/04/2017 1,380     Review of Systems  Constitutional: Negative for appetite change, chills, fever and unexpected weight change.  Respiratory: Negative for cough and shortness of breath.   Cardiovascular: Negative for leg swelling.  Gastrointestinal: Negative for constipation and diarrhea.  Genitourinary: Negative for difficulty urinating.  Psychiatric/Behavioral: Negative for sleep disturbance.       Objective:   Physical Exam Vitals reviewed.  Constitutional:      Appearance: Normal appearance.  HENT:     Mouth/Throat:     Mouth: Mucous membranes are moist.     Pharynx:  No oropharyngeal exudate.  Eyes:     Extraocular Movements: Extraocular movements intact.     Pupils: Pupils are equal, round, and reactive to light.  Cardiovascular:     Rate and Rhythm: Normal rate and regular rhythm.  Pulmonary:     Effort: Pulmonary effort is normal.     Breath sounds: Normal breath sounds.  Abdominal:     General: Bowel sounds are normal.     Palpations: Abdomen is soft.  Musculoskeletal:     Cervical back: Normal range of motion and neck supple.     Right lower leg: No edema.     Left lower leg: No edema.  Neurological:     General: No focal deficit present.     Mental Status: He is alert.  Psychiatric:        Mood and Affect: Mood normal.           Assessment & Plan:

## 2019-11-23 NOTE — Assessment & Plan Note (Signed)
Appreciate Jacob Ali's f/u. Appears to be doing well.

## 2019-11-24 ENCOUNTER — Encounter (INDEPENDENT_AMBULATORY_CARE_PROVIDER_SITE_OTHER): Payer: Self-pay | Admitting: Vascular Surgery

## 2019-12-01 ENCOUNTER — Encounter: Payer: Self-pay | Admitting: Primary Care

## 2019-12-01 ENCOUNTER — Ambulatory Visit: Payer: PRIVATE HEALTH INSURANCE | Admitting: Family Medicine

## 2019-12-01 ENCOUNTER — Other Ambulatory Visit: Payer: Self-pay

## 2019-12-01 ENCOUNTER — Ambulatory Visit: Payer: PRIVATE HEALTH INSURANCE | Admitting: Primary Care

## 2019-12-01 VITALS — BP 118/62 | HR 84 | Temp 98.4°F | Ht 69.0 in | Wt 177.0 lb

## 2019-12-01 DIAGNOSIS — E119 Type 2 diabetes mellitus without complications: Secondary | ICD-10-CM | POA: Diagnosis not present

## 2019-12-01 LAB — POCT GLYCOSYLATED HEMOGLOBIN (HGB A1C): Hemoglobin A1C: 4.9 % (ref 4.0–5.6)

## 2019-12-01 NOTE — Assessment & Plan Note (Addendum)
A1c today of 4.9, is currently not on any medications for this is controlled by diet.   Commended for his work on improving diet and significant reduction in A1c.  Managed on statin therapy & ACE.  Foot exam today.  Continue this and will follow up in 6 months.   Agree with assessment and plan. Pleas Koch, NP

## 2019-12-01 NOTE — Progress Notes (Signed)
Subjective:    Patient ID: Jacob Ali, male    DOB: 09-09-1960, 59 y.o.   MRN: [id number]  HPI  This visit occurred during the SARS-CoV-2 public health emergency.  Safety protocols were in place, including screening questions prior to the visit, additional usage of staff PPE, and extensive cleaning of exam room while observing appropriate contact time as indicated for disinfecting solutions.   Jacob Ali is a 59 year old male with a history of hypertension, HIV, type 2 diabetes, GERD, hyperlipidemia, tobacco abuse who presents today for diabetes follow up.  Current medications include: None. A1C in May 2021 of 8.4 on Metformin XR 500 mg, wanted to work on weight loss through diet. Repeat A1C in August 2021 of 4.3 so we discontinued metformin XR 500 mg.   He is checking his blood glucose numerous times daily and is getting readings of: 90's-low 100's.  Last A1C: 4.3 in August 2021, 4.9 today Last Eye Exam: Due Last Foot Exam: Due today Pneumonia Vaccination: Completed in 2020 ACE/ARB: Lisinopril  Statin: Lipitor  BP Readings from Last 3 Encounters:  12/01/19 118/62  11/23/19 (!) 181/77  05/26/19 130/80   Wt Readings from Last 3 Encounters:  12/01/19 177 lb (80.3 kg)  11/23/19 174 lb 9.6 oz (79.2 kg)  05/26/19 178 lb (80.7 kg)     Review of Systems  Eyes: Negative for visual disturbance.  Respiratory: Negative for shortness of breath.   Cardiovascular: Negative for chest pain.  Neurological: Negative for numbness.       Past Medical History:  Diagnosis Date  . Depression   . GERD (gastroesophageal reflux disease)   . HIV infection ([place])   . Hyperlipidemia   . Hypertension   . Peripheral vascular disease ([place])   . PONV (postoperative nausea and vomiting)   . Sleep apnea   . Tubular adenoma of colon 11/03/2017   Colonoscopy Sept 9, 2019     Social History   Socioeconomic History  . Marital status: Single    Spouse name: Not on file  . Number of children:  Not on file  . Years of education: 76  . Highest education level: Not on file  Occupational History  . Occupation: Recruitment consultant: [employer]  Tobacco Use  . Smoking status: Current Every Day Smoker    Packs/day: 0.50    Years: 40.00    Pack years: 20.00    Types: Cigars  . Smokeless tobacco: Never Used  . Tobacco comment: 1/2PPD  Substance and Sexual Activity  . Alcohol use: Yes    Alcohol/week: 3.0 standard drinks    Types: 3 Standard drinks or equivalent per week    Comment: 2-3 times a week  . Drug use: Yes    Frequency: 2.0 times per week    Types: Marijuana  . Sexual activity: Not Currently    Partners: Male    Birth control/protection: Condom    Comment: pt. given condoms  Other Topics Concern  . Not on file  Social History Narrative   Single, lives alone.     Social Determinants of Health   Financial Resource Strain:   . Difficulty of Paying Living Expenses: Not on file  Food Insecurity:   . Worried About Charity fundraiser in the Last Year: Not on file  . Ran Out of Food in the Last Year: Not on file  Transportation Needs:   . Lack of Transportation (Medical): Not on file  . Lack  of Transportation (Non-Medical): Not on file  Physical Activity:   . Days of Exercise per Week: Not on file  . Minutes of Exercise per Session: Not on file  Stress:   . Feeling of Stress : Not on file  Social Connections:   . Frequency of Communication with Friends and Family: Not on file  . Frequency of Social Gatherings with Friends and Family: Not on file  . Attends Religious Services: Not on file  . Active Member of Clubs or Organizations: Not on file  . Attends Archivist Meetings: Not on file  . Marital Status: Not on file  Intimate Partner Violence:   . Fear of Current or Ex-Partner: Not on file  . Emotionally Abused: Not on file  . Physically Abused: Not on file  . Sexually Abused: Not on file    Past Surgical History:  Procedure  Laterality Date  . Examination under anesthesia, repair of anal fissure,  12/04/2000  . PERIPHERAL VASCULAR CATHETERIZATION Left 02/06/2016   Procedure: Lower Extremity Angiography;  Surgeon: Katha Cabal, MD;  Location: [facility];  Service: Cardiovascular;  Laterality: Left;  . PERIPHERAL VASCULAR CATHETERIZATION N/A 02/06/2016   Procedure: Abdominal Aortogram w/Lower Extremity;  Surgeon: Katha Cabal, MD;  Location: [facility];  Service: Cardiovascular;  Laterality: N/A;  . PERIPHERAL VASCULAR CATHETERIZATION  02/06/2016   Procedure: Lower Extremity Intervention;  Surgeon: Katha Cabal, MD;  Location: [facility];  Service: Cardiovascular;;    Family History  Problem Relation Age of Onset  . Throat cancer Father   . Heart disease Father   . Cancer Father        bladder  . Cancer Maternal Grandmother        colon    Allergies  Allergen Reactions  . Tamiflu [Oseltamivir Phosphate] Other (See Comments)    Depressive symptoms    Current Outpatient Medications on File Prior to Visit  Medication Sig Dispense Refill  . ACCU-CHEK GUIDE test strip USE UP TO 4 TIMES A DAY AS DIRECTED 100 strip 5  . Accu-Chek Softclix Lancets lancets USE UP TO 4 TIMES A DAY AS DIRECTED 100 each 5  . atorvastatin (LIPITOR) 20 MG tablet Take 1 tablet (20 mg total) by mouth every evening. For cholesterol. 90 tablet 3  . BIKTARVY 50-200-25 MG TABS tablet TAKE 1 TABLET BY MOUTH DAILY. 30 tablet 5  . clopidogrel (PLAVIX) 75 MG tablet TAKE 1 TABLET BY MOUTH EVERY DAY 90 tablet 2  . ibuprofen (ADVIL) 800 MG tablet Take 1 tablet (800 mg total) by mouth daily as needed for moderate pain. 90 tablet 2  . lisinopril-hydrochlorothiazide (ZESTORETIC) 20-25 MG tablet Take 1 tablet by mouth daily. 90 tablet 2  . pantoprazole (PROTONIX) 40 MG tablet TAKE 1 TABLET BY MOUTH EVERY DAY 90 tablet 0  . tadalafil (CIALIS) 5 MG tablet TAKE 1 TABLET EVERY DAY AS NEEDED FOR ERECTILE  DYSFUNCTION 10 tablet 1   No current facility-administered medications on file prior to visit.    BP 118/62   Pulse 84   Temp 98.4 F (36.9 C) (Temporal)   Ht 5\' 9"  (1.753 m)   Wt 177 lb (80.3 kg)   SpO2 98%   BMI 26.14 kg/m    Objective:   Physical Exam Cardiovascular:     Rate and Rhythm: Normal rate and regular rhythm.  Pulmonary:     Effort: Pulmonary effort is normal.     Breath sounds: Normal breath  sounds.  Musculoskeletal:     Cervical back: Neck supple.  Skin:    General: Skin is warm and dry.            Assessment & Plan:

## 2019-12-01 NOTE — Patient Instructions (Addendum)
You are doing a great job controlling your type 2 diabetes with you diet.   It was a pleasure to see you today!     Health Maintenance Due  Topic Date Due  . OPHTHALMOLOGY EXAM  Need to call to make appointment  Never done      Recommended follow up: No follow-ups on file.

## 2019-12-01 NOTE — Progress Notes (Signed)
   Subjective:    Patient ID: Jacob Ali, male    DOB: 03-31-1960, 59 y.o.   MRN: [id number]  HPI   This visit occurred during the SARS-CoV-2 public health emergency.  Safety protocols were in place, including screening questions prior to the visit, additional usage of staff PPE, and extensive cleaning of exam room while observing appropriate contact time as indicated for disinfecting solutions.   Jacob Ali is a 59 year old male with history of hypertension, GERD, Type 2 diabetes, hyperlipidemia, HIV & GAD who presents today for a diabetes follow up.   He was removed from metformin 3 months ago after a1c of 4.3    He is checking his blood glucose 3-4 times daily and is getting readings of in low 90-100's.  Denies any low readings.   Last A1C: Lab Results  Component Value Date   HGBA1C 4.9 12/01/2019   Last Eye Exam: Due, working on this Last Foot Exam: completed today  Pneumonia Vaccination: Completed  ACE/ARB: Lisinopril Statin: Lipitor   Diet currently consists of: Is avoiding all sugars only artifical sugars. Limiting carb's trying to stay below 100g.   Wt Readings from Last 3 Encounters:  12/01/19 177 lb (80.3 kg)  11/23/19 174 lb 9.6 oz (79.2 kg)  05/26/19 178 lb (80.7 kg)   Exercise: Very active at work with walking no daily exercise.   Review of Systems  Constitutional: Negative.   HENT: Negative.   Respiratory: Negative.  Negative for shortness of breath.   Cardiovascular: Negative.  Negative for chest pain.  Gastrointestinal: Negative.   Genitourinary: Negative.   Skin: Negative.   Neurological: Negative.  Negative for dizziness and numbness.  Hematological: Negative.   Psychiatric/Behavioral: Negative.        Objective:   Physical Exam Cardiovascular:     Rate and Rhythm: Normal rate.     Pulses: Normal pulses.  Pulmonary:     Effort: Pulmonary effort is normal.  Musculoskeletal:        General: Normal range of motion.     Cervical back: Normal  range of motion.  Skin:    General: Skin is warm.     Capillary Refill: Capillary refill takes less than 2 seconds.  Neurological:     General: No focal deficit present.     Mental Status: He is alert and oriented to person, place, and time.           Assessment & Plan:

## 2019-12-16 ENCOUNTER — Telehealth: Payer: Self-pay | Admitting: *Deleted

## 2019-12-16 NOTE — Telephone Encounter (Signed)
RN notified by HR that pt called out of work today reporting HA and neck and shoulder pain. Reports neck and shoulder pain are chronic and he has discussed this with his pcp. HA is new but he feels it is muscle related, "slept on it wrong". Woke up at 0600 barely able to move neck. Took Ibuprofen and went back to sleep. Woke at 0900 with improving movement and pain. Denies any other sx. No concern for Covid related sx or need for quarantine/testing. Pt cleared to RTW tomorrow. Discussed home treatment with Ibuprofen, heat therapy. ER precautions discussed regarding worst HA of his life, dizziness, confusion, syncope. Pt verbalizes understanding and agreement. No further questions/concerns.

## 2019-12-17 ENCOUNTER — Encounter: Payer: Self-pay | Admitting: *Deleted

## 2019-12-17 NOTE — Telephone Encounter (Signed)
Noted please verify patient returned to work as expected.

## 2020-01-03 ENCOUNTER — Other Ambulatory Visit: Payer: Self-pay | Admitting: Primary Care

## 2020-01-17 ENCOUNTER — Other Ambulatory Visit: Payer: Self-pay | Admitting: Infectious Diseases

## 2020-01-17 DIAGNOSIS — K219 Gastro-esophageal reflux disease without esophagitis: Secondary | ICD-10-CM

## 2020-01-25 ENCOUNTER — Telehealth: Payer: Self-pay | Admitting: *Deleted

## 2020-01-25 NOTE — Telephone Encounter (Signed)
RN notified by HR that pt called out of work for the rest of the week citing personal issues but also noted stomach issues.   RN spoke with pt. He reports restarting Metformin at the beginning of the year and having diarrhea intermittently since then. Advised him of Imodium use if needed while his body adjusts to being back on Metformin. Hydrate well to replace fluids.   Personal medical. No quarantine or testing indicated.

## 2020-01-27 ENCOUNTER — Encounter: Payer: Self-pay | Admitting: *Deleted

## 2020-01-27 NOTE — Telephone Encounter (Signed)
Reviewed RN Haley note agree with plan of care. 
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
## 2020-01-30 NOTE — Telephone Encounter (Signed)
Telephone message left for patient checking in to see if he had any questions or concerns prior to returning to work tomorrow 01/31/2020

## 2020-02-15 ENCOUNTER — Telehealth: Payer: Self-pay

## 2020-02-15 NOTE — Telephone Encounter (Signed)
[facility] Patient Advocate Encounter  Prior Authorization for Jacob Ali has been approved.    PA# [id number] Effective dates: 02/15/20 through 01/13/38    [facility] will continue to follow.  [name], [facility] Patient [name] for Infectious Disease Phone: [phone] Fax:  [phone]

## 2020-03-08 ENCOUNTER — Other Ambulatory Visit: Payer: Self-pay | Admitting: Infectious Diseases

## 2020-03-08 DIAGNOSIS — B20 Human immunodeficiency virus [HIV] disease: Secondary | ICD-10-CM

## 2020-03-09 ENCOUNTER — Other Ambulatory Visit: Payer: Self-pay | Admitting: Infectious Diseases

## 2020-03-09 ENCOUNTER — Encounter: Payer: Self-pay | Admitting: Infectious Diseases

## 2020-03-09 NOTE — Telephone Encounter (Signed)
Letter sent to your inbox

## 2020-03-10 ENCOUNTER — Telehealth: Payer: Self-pay

## 2020-03-10 NOTE — Telephone Encounter (Signed)
Received call from patient, he states the letter from Dr. Johnnye Sima will not work. Patient has received updated guidelines on what the letter should contain. Per patient, letter should state "no prolonged standing or lifting more than 10 pounds repetitively." The letter should also state that there is no expiration date on these limitations as his condition is a lifetime one. Will route to provider.   Beryle Flock, RN

## 2020-03-13 ENCOUNTER — Encounter: Payer: Self-pay | Admitting: Infectious Diseases

## 2020-03-13 NOTE — Telephone Encounter (Signed)
I believe I have corrected this and sent to you Please let me know if this is correct, Thanks jeff

## 2020-03-16 ENCOUNTER — Emergency Department (HOSPITAL_COMMUNITY): Payer: No Typology Code available for payment source

## 2020-03-16 ENCOUNTER — Emergency Department (HOSPITAL_COMMUNITY)
Admission: EM | Admit: 2020-03-16 | Discharge: 2020-03-16 | Disposition: A | Discharge status: Home / Self Care | Payer: No Typology Code available for payment source | Attending: Emergency Medicine | Admitting: Emergency Medicine

## 2020-03-16 ENCOUNTER — Ambulatory Visit: Payer: PRIVATE HEALTH INSURANCE | Admitting: Infectious Diseases

## 2020-03-16 ENCOUNTER — Encounter (HOSPITAL_COMMUNITY): Payer: Self-pay | Admitting: Emergency Medicine

## 2020-03-16 DIAGNOSIS — Z23 Encounter for immunization: Secondary | ICD-10-CM | POA: Insufficient documentation

## 2020-03-16 DIAGNOSIS — E119 Type 2 diabetes mellitus without complications: Secondary | ICD-10-CM | POA: Insufficient documentation

## 2020-03-16 DIAGNOSIS — F17290 Nicotine dependence, other tobacco product, uncomplicated: Secondary | ICD-10-CM | POA: Diagnosis not present

## 2020-03-16 DIAGNOSIS — I10 Essential (primary) hypertension: Secondary | ICD-10-CM | POA: Insufficient documentation

## 2020-03-16 DIAGNOSIS — S020XXA Fracture of vault of skull, initial encounter for closed fracture: Secondary | ICD-10-CM | POA: Diagnosis not present

## 2020-03-16 DIAGNOSIS — Z21 Asymptomatic human immunodeficiency virus [HIV] infection status: Secondary | ICD-10-CM | POA: Diagnosis not present

## 2020-03-16 DIAGNOSIS — W01198A Fall on same level from slipping, tripping and stumbling with subsequent striking against other object, initial encounter: Secondary | ICD-10-CM | POA: Diagnosis not present

## 2020-03-16 DIAGNOSIS — S065X0A Traumatic subdural hemorrhage without loss of consciousness, initial encounter: Secondary | ICD-10-CM | POA: Insufficient documentation

## 2020-03-16 DIAGNOSIS — Z7902 Long term (current) use of antithrombotics/antiplatelets: Secondary | ICD-10-CM | POA: Insufficient documentation

## 2020-03-16 DIAGNOSIS — Z7984 Long term (current) use of oral hypoglycemic drugs: Secondary | ICD-10-CM | POA: Diagnosis not present

## 2020-03-16 DIAGNOSIS — S0990XA Unspecified injury of head, initial encounter: Secondary | ICD-10-CM | POA: Diagnosis present

## 2020-03-16 DIAGNOSIS — Z79899 Other long term (current) drug therapy: Secondary | ICD-10-CM | POA: Insufficient documentation

## 2020-03-16 DIAGNOSIS — Y92002 Bathroom of unspecified non-institutional (private) residence single-family (private) house as the place of occurrence of the external cause: Secondary | ICD-10-CM | POA: Diagnosis not present

## 2020-03-16 DIAGNOSIS — I6200 Nontraumatic subdural hemorrhage, unspecified: Secondary | ICD-10-CM

## 2020-03-16 DIAGNOSIS — S0101XA Laceration without foreign body of scalp, initial encounter: Secondary | ICD-10-CM

## 2020-03-16 DIAGNOSIS — W19XXXA Unspecified fall, initial encounter: Secondary | ICD-10-CM

## 2020-03-16 DIAGNOSIS — Y9301 Activity, walking, marching and hiking: Secondary | ICD-10-CM | POA: Diagnosis not present

## 2020-03-16 LAB — HEPATIC FUNCTION PANEL
ALT: 16 U/L (ref 0–44)
AST: 17 U/L (ref 15–41)
Albumin: 4.2 g/dL (ref 3.5–5.0)
Alkaline Phosphatase: 44 U/L (ref 38–126)
Bilirubin, Direct: 0.2 mg/dL (ref 0.0–0.2)
Indirect Bilirubin: 1.3 mg/dL — ABNORMAL HIGH (ref 0.3–0.9)
Total Bilirubin: 1.5 mg/dL — ABNORMAL HIGH (ref 0.3–1.2)
Total Protein: 6.8 g/dL (ref 6.5–8.1)

## 2020-03-16 LAB — CBC WITH DIFFERENTIAL/PLATELET
Abs Immature Granulocytes: 0.06 10*3/uL (ref 0.00–0.07)
Basophils Absolute: 0.1 10*3/uL (ref 0.0–0.1)
Basophils Relative: 1 %
Eosinophils Absolute: 0.2 10*3/uL (ref 0.0–0.5)
Eosinophils Relative: 1 %
HCT: 38.8 % — ABNORMAL LOW (ref 39.0–52.0)
Hemoglobin: 12.9 g/dL — ABNORMAL LOW (ref 13.0–17.0)
Immature Granulocytes: 1 %
Lymphocytes Relative: 32 %
Lymphs Abs: 4.1 10*3/uL — ABNORMAL HIGH (ref 0.7–4.0)
MCH: 33.7 pg (ref 26.0–34.0)
MCHC: 33.2 g/dL (ref 30.0–36.0)
MCV: 101.3 fL — ABNORMAL HIGH (ref 80.0–100.0)
Monocytes Absolute: 1 10*3/uL (ref 0.1–1.0)
Monocytes Relative: 8 %
Neutro Abs: 7.6 10*3/uL (ref 1.7–7.7)
Neutrophils Relative %: 57 %
Platelets: 231 10*3/uL (ref 150–400)
RBC: 3.83 MIL/uL — ABNORMAL LOW (ref 4.22–5.81)
RDW: 13.3 % (ref 11.5–15.5)
WBC: 13.1 10*3/uL — ABNORMAL HIGH (ref 4.0–10.5)
nRBC: 0 % (ref 0.0–0.2)

## 2020-03-16 LAB — BASIC METABOLIC PANEL
Anion gap: 10 (ref 5–15)
BUN: 11 mg/dL (ref 8–23)
CO2: 24 mmol/L (ref 22–32)
Calcium: 9.5 mg/dL (ref 8.9–10.3)
Chloride: 103 mmol/L (ref 98–111)
Creatinine, Ser: 0.99 mg/dL (ref 0.61–1.24)
GFR, Estimated: >60 mL/min (ref >60)
Glucose, Bld: 95 mg/dL (ref 70–99)
Potassium: 3.8 mmol/L (ref 3.5–5.1)
Sodium: 137 mmol/L (ref 135–145)

## 2020-03-16 MED ORDER — TETANUS-DIPHTH-ACELL PERTUSSIS 5-2.5-18.5 LF-MCG/0.5 IM SUSY
0.5000 mL | PREFILLED_SYRINGE | Freq: Once | INTRAMUSCULAR | Status: AC
  Administered 2020-03-16: 0.5 mL via INTRAMUSCULAR
  Filled 2020-03-16: qty 0.5

## 2020-03-16 MED ORDER — LACTATED RINGERS IV BOLUS
500.0000 mL | Freq: Once | INTRAVENOUS | Status: AC
  Administered 2020-03-16: 500 mL via INTRAVENOUS

## 2020-03-16 MED ORDER — LIDOCAINE-EPINEPHRINE 1 %-1:100000 IJ SOLN
10.0000 mL | Freq: Once | INTRAMUSCULAR | Status: AC
  Administered 2020-03-16: 10 mL
  Filled 2020-03-16: qty 1

## 2020-03-16 NOTE — Consult Note (Signed)
CC: Head trauma  HPI:     Patient is a 60 y.o. male with DM, PVD with claudication on Plavix tripped and hit his head on corned of a bathroom table.  He suffered a scalp laceration with significant bleeding that stopped with self-bandage.  He had a mild headache initially as well as nausea without vomiting.  He was brought to the hospital.  A CT head showed a hairline left frontal skull fracture and possible tiny left temporal extraaxial hemorrhage. He currently denies any headache, nausea, and is hungry.   Patient Active Problem List   Diagnosis Date Noted  . Preventative health care 05/26/2019  . Tubular adenoma of colon 11/03/2017  . Type 2 diabetes mellitus ([place]) 06/18/2016  . Hepatitis B immune 03/11/2016  . 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 Arthralgia 10/26/2014  . GERD (gastroesophageal reflux disease) 06/23/2013  . Tobacco use disorder 04/27/2013  . Erectile dysfunction 10/27/2012  . Insomnia 04/15/2012  . Generalized anxiety disorder 02/26/2011  . Suicide attempt ([place]) 02/20/2011  . HIV (human immunodeficiency virus infection) ([place]) 11/01/2010  . Hypertension 11/01/2010  . Hyperlipidemia 11/01/2010   Past Medical History:  Diagnosis Date  . Depression   . GERD (gastroesophageal reflux disease)   . HIV infection ([place])   . Hyperlipidemia   . Hypertension   . Peripheral vascular disease ([place])   . PONV (postoperative nausea and vomiting)   . Sleep apnea   . Tubular adenoma of colon 11/03/2017   Colonoscopy Sept 9, 2019    Past Surgical History:  Procedure Laterality Date  . Examination under anesthesia, repair of anal fissure,  12/04/2000  . PERIPHERAL VASCULAR CATHETERIZATION Left 02/06/2016   Procedure: Lower Extremity Angiography;  Surgeon: Katha Cabal, MD;  Location: [facility];  Service: Cardiovascular;  Laterality: Left;  . PERIPHERAL VASCULAR CATHETERIZATION N/A 02/06/2016   Procedure: Abdominal Aortogram  w/Lower Extremity;  Surgeon: Katha Cabal, MD;  Location: [facility];  Service: Cardiovascular;  Laterality: N/A;  . PERIPHERAL VASCULAR CATHETERIZATION  02/06/2016   Procedure: Lower Extremity Intervention;  Surgeon: Katha Cabal, MD;  Location: [facility];  Service: Cardiovascular;;    (Not in a hospital admission)  Allergies  Allergen Reactions  . Tamiflu [Oseltamivir Phosphate] Other (See Comments)    Depressive symptoms    Social History   Tobacco Use  . Smoking status: Current Every Day Smoker    Packs/day: 0.50    Years: 40.00    Pack years: 20.00    Types: Cigars  . Smokeless tobacco: Never Used  . Tobacco comment: 1/2PPD  Substance Use Topics  . Alcohol use: Yes    Alcohol/week: 3.0 standard drinks    Types: 3 Standard drinks or equivalent per week    Comment: 2-3 times a week    Family History  Problem Relation Age of Onset  . Throat cancer Father   . Heart disease Father   . Cancer Father        bladder  . Cancer Maternal Grandmother        colon     Review of Systems 10 of 12 systems reviewed. Pertinent items noted in HPI and remainder of comprehensive ROS otherwise negative.  Objective:   Patient Vitals for the past 8 hrs:  BP Temp Temp src Pulse Resp SpO2  03/16/20 1400 131/76 - - 62 13 95 %  03/16/20 1247 (!) 143/79 98 F (36.7 C) Oral 74 15 96 %  03/16/20 1215 - - - 79 18 96 %  03/16/20 1200 131/71 - - 72 15 94 %  03/16/20 1145 131/65 - - 76 15 94 %  03/16/20 1130 138/73 - - 78 16 96 %  03/16/20 1115 (!) 147/68 - - 72 17 95 %  03/16/20 1100 (!) 148/83 - - 78 15 96 %  03/16/20 0842 (!) 157/79 98.8 F (37.1 C) Oral 86 16 94 %   No intake/output data recorded. No intake/output data recorded.    General : Alert, cooperative, no distress, appears stated age   Head:  Gauze over left frontal scalp    Eyes: PERRL, conjunctiva/corneas clear, EOM's intact. Fundi could not be visualized Neck: Supple Chest:   Respirations unlabored Chest wall: no tenderness or deformity Heart: Regular rate and rhythm Abdomen: Soft, nontender and nondistended Extremities: warm and well-perfused Skin: normal turgor, color and texture Neurologic:  Alert, oriented x 3.  Eyes open spontaneously. PERRL, EOMI, VFC, no facial droop. V1-3 intact.  No dysarthria, tongue protrusion symmetric.  CNII-XII intact. Normal strength, sensation and reflexes throughout.  No pronator drift, full strength in legs       Data Review CBC:  Lab Results  Component Value Date   WBC 13.1 (H) 03/16/2020   RBC 3.83 (L) 03/16/2020   BMP:  Lab Results  Component Value Date   GLUCOSE 95 03/16/2020   CO2 24 03/16/2020   BUN 11 03/16/2020   BUN 16 06/06/2016   CREATININE 0.99 03/16/2020   CREATININE 1.08 11/03/2019   CALCIUM 9.5 03/16/2020   Coagulation:  Lab Results  Component Value Date   INR 1.20 02/23/2011    CT head without contrast was personally reviewed. See HPI for my impression.  Assessment:   60 yo M with DM, PVD with claudication on Plavix who suffered a scalp lac, nondisplaced skull fracture and tiny possible left temporal SDH vs tSAH  Plan:  - I had a long discussion with the patient and friend who was present at the bedside.  He is to undergo bedside lac repair by the ER.  It appears he suffered a minor intracranial hemorhage following his head trauma.  Given his Plavix use, I am recommending a 6 hour f/u CT head.  If his head CT is stable, given his concussive symptoms have resolved and he remains GCS 15, it would be safe for him to be discharged home with an accompanying adult with f/u in 2-4 weeks in neurosurgery clinic, with instructions to return to ER for worsening.  He will need to hold his Plavix for 7 days.  His ICH is so minor, prophylactic AEDs are not necessary.  If his head CT shows, progression, he likely will require admission to the hospital for overnight monitoring and another repeat CT in am.  I  recommended to keep SBPs< 150 and MAPs 70-90 in the meantime.  I discussed this plan of care with the ER provider

## 2020-03-16 NOTE — ED Provider Notes (Signed)
[facility] EMERGENCY DEPARTMENT Provider Note   CSN: [id number] Arrival date & time: 03/16/20  3710     History No chief complaint on file.   Jacob Ali is a 60 y.o. male with history of atherosclerosis with claudication of lower extremities, on Plavix, presents to the ED for evaluation of head injury.  Patient was walking into his bathroom and he tripped falling forward hitting the left side of his head against the vanity corner.  This was around 730-8 AM.  Reports sudden severe headache and nausea but headache has since improved and nausea has resolved.  Headache currently is a 1/10.  Denies loss of consciousness.  No visual changes.  No neck pain.  Denies one-sided weakness or numbness, difficulty with speech or balance. No interventions.   HPI     Past Medical History:  Diagnosis Date  . Depression   . GERD (gastroesophageal reflux disease)   . HIV infection ([place])   . Hyperlipidemia   . Hypertension   . Peripheral vascular disease ([place])   . PONV (postoperative nausea and vomiting)   . Sleep apnea   . Tubular adenoma of colon 11/03/2017   Colonoscopy Sept 9, 2019    Patient Active Problem List   Diagnosis Date Noted  . Preventative health care 05/26/2019  . Tubular adenoma of colon 11/03/2017  . Type 2 diabetes mellitus ([place]) 06/18/2016  . Hepatitis B immune 03/11/2016  . Atherosclerosis with claudication of extremity ([place]) 01/29/2016  . Arthralgia 10/26/2014  . GERD (gastroesophageal reflux disease) 06/23/2013  . Tobacco use disorder 04/27/2013  . Erectile dysfunction 10/27/2012  . Insomnia 04/15/2012  . Generalized anxiety disorder 02/26/2011    Class: Acute  . Suicide attempt ([place]) 02/20/2011  . HIV (human immunodeficiency virus infection) ([place]) 11/01/2010  . Hypertension 11/01/2010  . Hyperlipidemia 11/01/2010    Past Surgical History:  Procedure Laterality Date  . Examination under anesthesia, repair of anal fissure,  12/04/2000  .  PERIPHERAL VASCULAR CATHETERIZATION Left 02/06/2016   Procedure: Lower Extremity Angiography;  Surgeon: Katha Cabal, MD;  Location: [place] [facility];  Service: Cardiovascular;  Laterality: Left;  . PERIPHERAL VASCULAR CATHETERIZATION N/A 02/06/2016   Procedure: Abdominal Aortogram w/Lower Extremity;  Surgeon: Katha Cabal, MD;  Location: [place] [facility];  Service: Cardiovascular;  Laterality: N/A;  . PERIPHERAL VASCULAR CATHETERIZATION  02/06/2016   Procedure: Lower Extremity Intervention;  Surgeon: Katha Cabal, MD;  Location: [place] [facility];  Service: Cardiovascular;;       Family History  Problem Relation Age of Onset  . Throat cancer Father   . Heart disease Father   . Cancer Father        bladder  . Cancer Maternal Grandmother        colon    Social History   Tobacco Use  . Smoking status: Current Every Day Smoker    Packs/day: 0.50    Years: 40.00    Pack years: 20.00    Types: Cigars  . Smokeless tobacco: Never Used  . Tobacco comment: 1/2PPD  Substance Use Topics  . Alcohol use: Yes    Alcohol/week: 3.0 standard drinks    Types: 3 Standard drinks or equivalent per week    Comment: 2-3 times a week  . Drug use: Yes    Frequency: 2.0 times per week    Types: Marijuana    Home Medications Prior to Admission medications   Medication Sig Start Date End Date Taking? Authorizing  Provider  atorvastatin (LIPITOR) 20 MG tablet Take 1 tablet (20 mg total) by mouth every evening. For cholesterol. Patient taking differently: Take 20 mg by mouth daily. 05/27/19  Yes Pleas Koch, NP  BIKTARVY 50-200-25 MG TABS tablet TAKE 1 TABLET BY MOUTH DAILY. Patient taking differently: Take 1 tablet by mouth daily. 03/09/20  Yes Campbell Riches, MD  clopidogrel (PLAVIX) 75 MG tablet TAKE 1 TABLET BY MOUTH EVERY DAY Patient taking differently: Take 75 mg by mouth daily. 10/05/19  Yes Kris Hartmann, NP  fluticasone (FLONASE) 50 MCG/ACT nasal spray  Place 1 spray into both nostrils daily as needed for allergies or rhinitis.   Yes [provider]  glucosamine-chondroitin 500-400 MG tablet Take 1 tablet by mouth daily.   Yes [provider]  ibuprofen (ADVIL) 800 MG tablet Take 1 tablet (800 mg total) by mouth daily as needed for moderate pain. 08/24/19  Yes Baity, Coralie Keens, NP  lisinopril-hydrochlorothiazide (ZESTORETIC) 20-25 MG tablet Take 1 tablet by mouth daily. 08/24/19  Yes Baity, Coralie Keens, NP  metFORMIN (GLUCOPHAGE-XR) 500 MG 24 hr tablet Take 500 mg by mouth daily with breakfast.   Yes [provider]  pantoprazole (PROTONIX) 40 MG tablet TAKE 1 TABLET BY MOUTH EVERY DAY Patient taking differently: Take 40 mg by mouth daily. 01/17/20  Yes Campbell Riches, MD  tadalafil (CIALIS) 5 MG tablet TAKE 1 TABLET EVERY DAY AS NEEDED FOR ERECTILE DYSFUNCTION Patient taking differently: Take 5 mg by mouth daily as needed for erectile dysfunction. 12/23/18  Yes Campbell Riches, MD  triamcinolone (NASACORT ALLERGY 24HR) 55 MCG/ACT AERO nasal inhaler Place 1 spray into the nose daily as needed (allergies).   Yes [provider]  ACCU-CHEK GUIDE test strip USE UP TO 4 TIMES A DAY AS DIRECTED 01/03/20   Pleas Koch, NP  Accu-Chek Softclix Lancets lancets USE UP TO 4 TIMES A DAY AS DIRECTED 06/21/19   Pleas Koch, NP    Allergies    Tamiflu [oseltamivir phosphate]  Review of Systems   Review of Systems  Gastrointestinal: Positive for nausea.  Skin: Positive for wound.  Neurological: Positive for headaches.  All other systems reviewed and are negative.   Physical Exam Updated Vital Signs BP 131/76   Pulse 62   Temp 98 F (36.7 C) (Oral)   Resp 13   SpO2 95%   Physical Exam Vitals and nursing note reviewed.  Constitutional:      General: He is not in acute distress.    Appearance: He is well-developed and well-nourished.     Comments: NAD.  HENT:     Head: Normocephalic.     Comments:  6 cm linear laceration left forehead/temporal scalp with hematoma    Right Ear: External ear normal.     Left Ear: External ear normal.     Nose: Nose normal.  Eyes:     General: No scleral icterus.    Extraocular Movements: EOM normal.     Conjunctiva/sclera: Conjunctivae normal.  Cardiovascular:     Rate and Rhythm: Normal rate and regular rhythm.     Pulses: Intact distal pulses.     Heart sounds: Normal heart sounds. No murmur heard.   Pulmonary:     Effort: Pulmonary effort is normal.     Breath sounds: Normal breath sounds. No wheezing.  Musculoskeletal:        General: No deformity. Normal range of motion.     Cervical back: Normal range of  motion and neck supple.  Skin:    General: Skin is warm and dry.     Capillary Refill: Capillary refill takes less than 2 seconds.  Neurological:     Mental Status: He is alert and oriented to person, place, and time.     Comments:   Mental Status: Patient is awake, alert, oriented to person, place, year, and situation. Patient is able to give a clear and coherent history.  Speech is fluent and clear without dysarthria or aphasia.  No signs of neglect.  Cranial Nerves: I not tested II visual fields full bilaterally. PERRL.   III, IV, VI EOMs intact without ptosis V sensation to light touch intact in all 3 divisions of trigeminal nerve bilaterally  VII facial movements symmetric bilaterally VIII hearing intact to voice/conversation  IX, X no uvula deviation, symmetric rise of soft palate/uvula XI 5/5 SCM and trapezius strength bilaterally  XII tongue protrusion midline, symmetric L/R movements  Motor: Strength 5/5 in upper/lower extremities.   Sensation to light touch intact in face, upper/lower extremities. No pronator drift. No leg drop.  Cerebellar: No ataxia with finger to nose. Steady gait.   Psychiatric:        Mood and Affect: Mood and affect normal.        Behavior: Behavior normal.        Thought Content:  Thought content normal.        Judgment: Judgment normal.     ED Results / Procedures / Treatments   Labs (all labs ordered are listed, but only abnormal results are displayed) Labs Reviewed  CBC WITH DIFFERENTIAL/PLATELET - Abnormal; Notable for the following components:      Result Value   WBC 13.1 (*)    RBC 3.83 (*)    Hemoglobin 12.9 (*)    HCT 38.8 (*)    MCV 101.3 (*)    Lymphs Abs 4.1 (*)    All other components within normal limits  HEPATIC FUNCTION PANEL - Abnormal; Notable for the following components:   Total Bilirubin 1.5 (*)    Indirect Bilirubin 1.3 (*)    All other components within normal limits  BASIC METABOLIC PANEL    EKG None  Radiology CT Head Wo Contrast  Result Date: 03/16/2020 CLINICAL DATA:  Tripped and fell striking head against bathroom vanity, denies loss of consciousness, frontal hematoma and laceration EXAM: CT HEAD WITHOUT CONTRAST TECHNIQUE: Contiguous axial images were obtained from the base of the skull through the vertex without intravenous contrast. Sagittal and coronal MPR images reconstructed from axial data set. COMPARISON:  None FINDINGS: Brain: Minimal atrophy. Normal ventricular morphology. No midline shift or mass effect. Small vessel chronic ischemic changes of deep cerebral white matter. Small focus of high attenuation extra-axial at LEFT temporal lobe consistent with minimal subdural hemorrhage, best visualized on coronal images. No intraparenchymal hemorrhage, mass lesion or evidence of acute infarction. No extra-axial fluid collections. Vascular: No hyperdense vessels Skull: LEFT scalp hematoma. Nondisplaced LEFT frontal skull fracture. Sinuses/Orbits: Paranasal sinuses and mastoid air cells clear Other: N/A IMPRESSION: Atrophy with small vessel chronic ischemic changes of deep cerebral white matter. Small focus of acute subdural hemorrhage at LEFT temporal lobe. Nondisplaced LEFT frontal skull fracture. Critical Value/emergent results  were called by telephone at the time of interpretation on 03/16/2020 at 1022 hours to provider DR. DANIELLE RAY, who verbally acknowledged these results. Electronically Signed   By: Lavonia Dana M.D.   On: 03/16/2020 10:23    Procedures .Marland KitchenLaceration Repair  Date/Time: 03/16/2020 2:40 PM Performed by: Kinnie Feil, PA-C Authorized by: Kinnie Feil, PA-C   Consent:    Consent obtained:  Verbal   Consent given by:  Patient   Risks discussed:  Infection, need for additional repair, pain, poor cosmetic result and poor wound healing   Alternatives discussed:  No treatment and delayed treatment Universal protocol:    Procedure explained and questions answered to patient or proxy's satisfaction: yes     Relevant documents present and verified: yes     Test results available: yes     Imaging studies available: yes     Required blood products, implants, devices, and special equipment available: yes     Site/side marked: yes     Immediately prior to procedure, a time out was called: yes     Patient identity confirmed:  Verbally with patient Anesthesia:    Anesthesia method:  Local infiltration   Local anesthetic:  Lidocaine 2% WITH epi Laceration details:    Location:  Scalp   Scalp location:  Frontal   Length (cm):  7 Pre-procedure details:    Preparation:  Patient was prepped and draped in usual sterile fashion and imaging obtained to evaluate for foreign bodies Exploration:    Hemostasis achieved with:  Epinephrine and direct pressure   Wound exploration: entire depth of wound visualized     Wound extent: underlying fracture     Wound extent: no muscle damage noted, no nerve damage noted and no vascular damage noted     Contaminated: no   Treatment:    Area cleansed with:  Povidone-iodine   Amount of cleaning:  Standard   Irrigation solution:  Sterile saline   Irrigation volume:  100   Irrigation method:  Syringe and tap   Visualized foreign bodies/material removed: no      Debridement:  None Skin repair:    Repair method:  Sutures   Suture size:  5-0   Suture material:  Prolene   Suture technique:  Simple interrupted   Number of sutures:  7 Approximation:    Approximation:  Close Repair type:    Repair type:  Intermediate Post-procedure details:    Dressing:  Non-adherent dressing   Procedure completion:  Tolerated well, no immediate complications .Critical Care Performed by: Kinnie Feil, PA-C Authorized by: Kinnie Feil, PA-C   Critical care provider statement:    Critical care time (minutes):  45   Critical care was necessary to treat or prevent imminent or life-threatening deterioration of the following conditions: subdural hemorrhage.   Critical care was time spent personally by me on the following activities:  Discussions with consultants, evaluation of patient's response to treatment, examination of patient, ordering and performing treatments and interventions, ordering and review of laboratory studies, ordering and review of radiographic studies, pulse oximetry, re-evaluation of patient's condition, obtaining history from patient or surrogate, review of old charts and development of treatment plan with patient or surrogate   I assumed direction of critical care for this patient from another provider in my specialty: no       Medications Ordered in ED Medications  lidocaine-EPINEPHrine (XYLOCAINE W/EPI) 1 %-1:100000 (with pres) injection 10 mL (10 mLs Infiltration Given by Other 03/16/20 1245)  Tdap (BOOSTRIX) injection 0.5 mL (0.5 mLs Intramuscular Given 03/16/20 1244)    ED Course  I have reviewed the triage vital signs and the nursing notes.  Pertinent labs & imaging results that were available during my care of the patient  were reviewed by me and considered in my medical decision making (see chart for details).  Clinical Course as of 03/16/20 1447  Thu Mar 16, 2020  1040 Went to see patient, not in room yet  [CG]  1107 CT  Head Wo Contrast IMPRESSION: Atrophy with small vessel chronic ischemic changes of deep cerebral white matter.  Small focus of acute subdural hemorrhage at LEFT temporal lobe.  Nondisplaced LEFT frontal skull fracture.  Critical Value/emergent results were called by telephone at the time of interpretation on 03/16/2020 at 1022 hours to provider DR. DANIELLE RAY, who verbally acknowledged these results. [CG]    Clinical Course User Index [CG] Arlean Hopping   MDM Rules/Calculators/A&P                          60 year old male presents to the ED after mechanical fall.  Has a laceration to the left frontal scalp/forehead.  Severe posttraumatic headache and nausea and has significantly improved since.  On Plavix.  EMR, triage nursing notes reviewed to assist with history.  Additional information obtained from patient's friend/partner at bedside.  Labs, imaging ordered by me and reassuring as above  Labs, imaging personally visualized and interpreted  Labs reveal-hemoglobin 12.9, HCT 38.8, WBC 13.1.  Others are unremarkable  Imaging reveals-small acute subdural hemorrhage at left temporal lobe with nondisplaced left frontal skull fracture  Patient has no neuro deficits on my exam.  Has a 1/10 headache.  Consults placed in the ED-neurosurgery, Dr. Marcello Moores, saw patient in the ED.  Recommends repeat CT 6 hours after initial CT.  If no significant change patient could be discharged with follow-up in 1 week.  Recommends bedside laceration repair of wound.  This is not an open fracture, does not recommend antibiotics.  Admit for any worsening/enlarging hemorrhage on repeat scan.  1445: Laceration repaired by me in the ED without immediate complications.  Discussed plan with patient who is in agreement with it.  No headache.  Patient care will be transferred to oncoming ED PA/MD who will follow up on CT scan and determine disposition.  Shared visit with EDP.   Final Clinical  Impression(s) / ED Diagnoses Final diagnoses:  Fall, initial encounter  Laceration of scalp, initial encounter  Closed fracture of frontal bone, initial encounter ([place])  Subdural hemorrhage [facility])    Rx / DC Orders ED Discharge Orders    None       Kinnie Feil, PA-C 03/16/20 1447    Wyvonnia Dusky, MD 03/16/20 1734

## 2020-03-16 NOTE — ED Provider Notes (Signed)
Physical Exam  BP 125/74 (BP Location: Right Arm)   Pulse 74   Temp 98 F (36.7 C) (Oral)   Resp 15   SpO2 97%   Physical Exam Vitals and nursing note reviewed.  Constitutional:      General: He is awake. He is not in acute distress.    Appearance: Normal appearance. He is well-developed and well-groomed. He is not ill-appearing.  HENT:     Head: Normocephalic.     Comments: Sutured linear 6cm laceration noted to frontal scalp.    Right Ear: External ear normal.     Left Ear: External ear normal.     Nose: Nose normal.  Eyes:     General: No scleral icterus.       Right eye: No discharge.        Left eye: No discharge.     Conjunctiva/sclera: Conjunctivae normal.  Cardiovascular:     Rate and Rhythm: Normal rate and regular rhythm.  Pulmonary:     Effort: Pulmonary effort is normal. No respiratory distress.  Musculoskeletal:        General: No tenderness or signs of injury.     Right lower leg: No edema.     Left lower leg: No edema.  Skin:    General: Skin is warm and dry.     Findings: Rash present.  Neurological:     General: No focal deficit present.     Mental Status: He is alert and oriented to person, place, and time.     GCS: GCS eye subscore is 4. GCS verbal subscore is 5. GCS motor subscore is 6.     Cranial Nerves: Cranial nerves are intact. No cranial nerve deficit.     Sensory: Sensation is intact. No sensory deficit.     Motor: Motor function is intact. No weakness.     Coordination: Coordination is intact.     Gait: Gait is intact. Gait normal.  Psychiatric:        Behavior: Behavior is cooperative.     ED Course/Procedures   Clinical Course as of 03/16/20 1808  Thu Mar 16, 2020  1040 Went to see patient, not in room yet  [CG]  1107 CT Head Wo Contrast IMPRESSION: Atrophy with small vessel chronic ischemic changes of deep cerebral white matter.  Small focus of acute subdural hemorrhage at LEFT temporal lobe.  Nondisplaced LEFT frontal  skull fracture.  Critical Value/emergent results were called by telephone at the time of interpretation on 03/16/2020 at 1022 hours to provider DR. DANIELLE RAY, who verbally acknowledged these results. [CG]    Clinical Course User Index [CG] Kinnie Feil, PA-C    Procedures None  MDM  Assumed care from Carmon Sails PA-C at 1500. Please refer to her note for full H&P and initial MDM. Briefly, 59yoM mechanical GLF earlier today on Plavix with small SDH and frontal skull fx. NSU consulted and recommend repeat CT head at 1600. If CT head stable, anticipate discharge home with 1-week follow up. Forehead laceration repaired.  Repeat CT head stable. On reassessment, patient GCS 15 and ambulating safely. Patient instructed to follow up with NSU clinic in 1 week for follow up and to have stitches removed. Will hold Plavix for the next week per NSU recs. Strict return precautions provided and discussed. Questions and concerns addressed. Patient verbalized understanding and amenable with discharge plan. Discharged in stable condition.        Christy Gentles, MD 03/16/20 [phone]  Lucrezia Starch, MD 03/18/20 [phone]

## 2020-03-16 NOTE — ED Provider Notes (Incomplete)
[facility] EMERGENCY DEPARTMENT Provider Note   CSN: [id number] Arrival date & time: 03/16/20  3382     History No chief complaint on file.   Jacob Ali is a 60 y.o. male with history of atherosclerosis with claudication of lower extremities, on Plavix, presents to the ED for evaluation of head injury.  Patient was walking into his bathroom and he tripped falling forward hitting the left side of his head against the vanity corner.  This was around 730-8 AM.  Reports sudden severe headache and nausea but headache has since improved and nausea has resolved.  Headache currently is a 1/10.  Denies loss of consciousness.  No visual changes.  No neck pain.  Denies one-sided weakness or numbness, difficulty with speech or balance. No interventions.   HPI     Past Medical History:  Diagnosis Date  . Depression   . GERD (gastroesophageal reflux disease)   . HIV infection ([place])   . Hyperlipidemia   . Hypertension   . Peripheral vascular disease ([place])   . PONV (postoperative nausea and vomiting)   . Sleep apnea   . Tubular adenoma of colon 11/03/2017   Colonoscopy Sept 9, 2019    Patient Active Problem List   Diagnosis Date Noted  . Preventative health care 05/26/2019  . Tubular adenoma of colon 11/03/2017  . Type 2 diabetes mellitus ([place]) 06/18/2016  . Hepatitis B immune 03/11/2016  . Atherosclerosis with claudication of extremity ([place]) 01/29/2016  . Arthralgia 10/26/2014  . GERD (gastroesophageal reflux disease) 06/23/2013  . Tobacco use disorder 04/27/2013  . Erectile dysfunction 10/27/2012  . Insomnia 04/15/2012  . Generalized anxiety disorder 02/26/2011    Class: Acute  . Suicide attempt ([place]) 02/20/2011  . HIV (human immunodeficiency virus infection) ([place]) 11/01/2010  . Hypertension 11/01/2010  . Hyperlipidemia 11/01/2010    Past Surgical History:  Procedure Laterality Date  . Examination under anesthesia, repair of anal fissure,  12/04/2000  .  PERIPHERAL VASCULAR CATHETERIZATION Left 02/06/2016   Procedure: Lower Extremity Angiography;  Surgeon: Katha Cabal, MD;  Location: [place] [facility];  Service: Cardiovascular;  Laterality: Left;  . PERIPHERAL VASCULAR CATHETERIZATION N/A 02/06/2016   Procedure: Abdominal Aortogram w/Lower Extremity;  Surgeon: Katha Cabal, MD;  Location: [place] [facility];  Service: Cardiovascular;  Laterality: N/A;  . PERIPHERAL VASCULAR CATHETERIZATION  02/06/2016   Procedure: Lower Extremity Intervention;  Surgeon: Katha Cabal, MD;  Location: [place] [facility];  Service: Cardiovascular;;       Family History  Problem Relation Age of Onset  . Throat cancer Father   . Heart disease Father   . Cancer Father        bladder  . Cancer Maternal Grandmother        colon    Social History   Tobacco Use  . Smoking status: Current Every Day Smoker    Packs/day: 0.50    Years: 40.00    Pack years: 20.00    Types: Cigars  . Smokeless tobacco: Never Used  . Tobacco comment: 1/2PPD  Substance Use Topics  . Alcohol use: Yes    Alcohol/week: 3.0 standard drinks    Types: 3 Standard drinks or equivalent per week    Comment: 2-3 times a week  . Drug use: Yes    Frequency: 2.0 times per week    Types: Marijuana    Home Medications Prior to Admission medications   Medication Sig Start Date End Date Taking? Authorizing  Provider  ACCU-CHEK GUIDE test strip USE UP TO 4 TIMES A DAY AS DIRECTED 01/03/20   Pleas Koch, NP  Accu-Chek Softclix Lancets lancets USE UP TO 4 TIMES A DAY AS DIRECTED 06/21/19   Pleas Koch, NP  atorvastatin (LIPITOR) 20 MG tablet Take 1 tablet (20 mg total) by mouth every evening. For cholesterol. 05/27/19   Pleas Koch, NP  BIKTARVY 50-200-25 MG TABS tablet TAKE 1 TABLET BY MOUTH DAILY. 03/09/20   Campbell Riches, MD  clopidogrel (PLAVIX) 75 MG tablet TAKE 1 TABLET BY MOUTH EVERY DAY 10/05/19   Kris Hartmann, NP  ibuprofen (ADVIL) 800 MG  tablet Take 1 tablet (800 mg total) by mouth daily as needed for moderate pain. 08/24/19   Jearld Fenton, NP  lisinopril-hydrochlorothiazide (ZESTORETIC) 20-25 MG tablet Take 1 tablet by mouth daily. 08/24/19   Jearld Fenton, NP  pantoprazole (PROTONIX) 40 MG tablet TAKE 1 TABLET BY MOUTH EVERY DAY 01/17/20   Campbell Riches, MD  tadalafil (CIALIS) 5 MG tablet TAKE 1 TABLET EVERY DAY AS NEEDED FOR ERECTILE DYSFUNCTION 12/23/18   Campbell Riches, MD    Allergies    Tamiflu [oseltamivir phosphate]  Review of Systems   Review of Systems  Gastrointestinal: Positive for nausea (resolved).  Skin: Positive for wound.  Neurological: Positive for headaches.  All other systems reviewed and are negative.   Physical Exam Updated Vital Signs BP (!) 148/83   Pulse 78   Temp 98.8 F (37.1 C) (Oral)   Resp 15   SpO2 96%   Physical Exam Vitals and nursing note reviewed.  Constitutional:      General: He is not in acute distress.    Appearance: He is well-developed and well-nourished.     Comments: NAD.  HENT:     Head: Normocephalic. Contusion present.     Comments: 6 cm linear laceration left forehead/temporal scalp with hematoma    Right Ear: External ear normal.     Left Ear: External ear normal.     Nose: Nose normal.  Eyes:     General: No scleral icterus.    Extraocular Movements: EOM normal.     Conjunctiva/sclera: Conjunctivae normal.  Cardiovascular:     Rate and Rhythm: Normal rate and regular rhythm.     Pulses: Intact distal pulses.     Heart sounds: Normal heart sounds.  Pulmonary:     Effort: Pulmonary effort is normal.     Breath sounds: Normal breath sounds.  Musculoskeletal:        General: No deformity. Normal range of motion.     Cervical back: Normal range of motion and neck supple.  Skin:    General: Skin is warm and dry.     Capillary Refill: Capillary refill takes less than 2 seconds.  Neurological:     Mental Status: He is alert and oriented to  person, place, and time.     Comments:   Mental Status:  Patient is awake, alert, oriented to person, place, year, and situation. Patient is able to give a clear and coherent history.  Speech is fluent and clear without dysarthria or aphasia.  No signs of neglect.  Cranial Nerves: I not tested II visual fields full bilaterally. PERRL.   III, IV, VI EOMs intact without ptosis V sensation to light touch intact in all 3 divisions of trigeminal nerve bilaterally  VII facial movements symmetric bilaterally VIII hearing intact to voice/conversation  IX, X no uvula  deviation, symmetric rise of soft palate/uvula XI 5/5 SCM and trapezius strength bilaterally  XII tongue protrusion midline, symmetric L/R movements  Motor: Strength 5/5 in upper/lower extremities.   Sensation to light touch intact in face, upper/lower extremities. No pronator drift. No leg drop.  Cerebellar: No ataxia with finger to nose.  Steady gait. No truncal sway. Normal Romberg.   Psychiatric:        Mood and Affect: Mood and affect normal.        Behavior: Behavior normal.        Thought Content: Thought content normal.        Judgment: Judgment normal.     ED Results / Procedures / Treatments   Labs (all labs ordered are listed, but only abnormal results are displayed) Labs Reviewed - No data to display  EKG None  Radiology CT Head Wo Contrast  Result Date: 03/16/2020 CLINICAL DATA:  Tripped and fell striking head against bathroom vanity, denies loss of consciousness, frontal hematoma and laceration EXAM: CT HEAD WITHOUT CONTRAST TECHNIQUE: Contiguous axial images were obtained from the base of the skull through the vertex without intravenous contrast. Sagittal and coronal MPR images reconstructed from axial data set. COMPARISON:  None FINDINGS: Brain: Minimal atrophy. Normal ventricular morphology. No midline shift or mass effect. Small vessel chronic ischemic changes of deep cerebral white matter. Small  focus of high attenuation extra-axial at LEFT temporal lobe consistent with minimal subdural hemorrhage, best visualized on coronal images. No intraparenchymal hemorrhage, mass lesion or evidence of acute infarction. No extra-axial fluid collections. Vascular: No hyperdense vessels Skull: LEFT scalp hematoma. Nondisplaced LEFT frontal skull fracture. Sinuses/Orbits: Paranasal sinuses and mastoid air cells clear Other: N/A IMPRESSION: Atrophy with small vessel chronic ischemic changes of deep cerebral white matter. Small focus of acute subdural hemorrhage at LEFT temporal lobe. Nondisplaced LEFT frontal skull fracture. Critical Value/emergent results were called by telephone at the time of interpretation on 03/16/2020 at 1022 hours to provider DR. DANIELLE RAY, who verbally acknowledged these results. Electronically Signed   By: Lavonia Dana M.D.   On: 03/16/2020 10:23    Procedures Procedures {Remember to document critical care time when appropriate:1}  Medications Ordered in ED Medications - No data to display  ED Course  I have reviewed the triage vital signs and the nursing notes.  Pertinent labs & imaging results that were available during my care of the patient were reviewed by me and considered in my medical decision making (see chart for details).  Clinical Course as of 03/16/20 1117  Thu Mar 16, 2020  1040 Went to see patient, not in room yet  [CG]  1107 CT Head Wo Contrast IMPRESSION: Atrophy with small vessel chronic ischemic changes of deep cerebral white matter.  Small focus of acute subdural hemorrhage at LEFT temporal lobe.  Nondisplaced LEFT frontal skull fracture.  Critical Value/emergent results were called by telephone at the time of interpretation on 03/16/2020 at 1022 hours to provider DR. DANIELLE RAY, who verbally acknowledged these results. [CG]    Clinical Course User Index [CG] Arlean Hopping   MDM Rules/Calculators/A&P                           *** Final Clinical Impression(s) / ED Diagnoses Final diagnoses:  None    Rx / DC Orders ED Discharge Orders    None

## 2020-03-16 NOTE — ED Triage Notes (Signed)
Pt here after tripping this morning and hitting his head , pt has lac to the right front of head , dsg applied by pt bleeding controlled , pt is on plavix

## 2020-03-23 ENCOUNTER — Ambulatory Visit: Payer: PRIVATE HEALTH INSURANCE | Admitting: Primary Care

## 2020-03-23 ENCOUNTER — Encounter: Payer: Self-pay | Admitting: Primary Care

## 2020-03-23 ENCOUNTER — Other Ambulatory Visit: Payer: Self-pay

## 2020-03-23 DIAGNOSIS — S065XAA Traumatic subdural hemorrhage with loss of consciousness status unknown, initial encounter: Secondary | ICD-10-CM

## 2020-03-23 DIAGNOSIS — S0101XS Laceration without foreign body of scalp, sequela: Secondary | ICD-10-CM

## 2020-03-23 DIAGNOSIS — S065X9A Traumatic subdural hemorrhage with loss of consciousness of unspecified duration, initial encounter: Secondary | ICD-10-CM

## 2020-03-23 DIAGNOSIS — S0101XA Laceration without foreign body of scalp, initial encounter: Secondary | ICD-10-CM | POA: Insufficient documentation

## 2020-03-23 HISTORY — DX: Traumatic subdural hemorrhage with loss of consciousness status unknown, initial encounter: S06.5XAA

## 2020-03-23 NOTE — Assessment & Plan Note (Signed)
Since fall 1 week ago.  7 sutures removed from laceration site, patient tolerated well.  Laceration site appears to be healing well, no evidence of infection.

## 2020-03-23 NOTE — Assessment & Plan Note (Addendum)
Secondary to fall x1 week ago.  Emergency department notes and neurosurgery notes reviewed.  He does not appear to be suffering from any increased complications from his fall/injury.  He will follow up with neurosurgery as scheduled later this month.  Return precautions provided.  Work note provided with restrictions.

## 2020-03-23 NOTE — Patient Instructions (Signed)
Please notify the neurosurgeon if you develop increased headaches, increased dizziness, visual changes.  Follow-up with a neurosurgeon as scheduled.  It was a pleasure to see you today!

## 2020-03-23 NOTE — Progress Notes (Signed)
Subjective:    Patient ID: Jacob Ali, male    DOB: Nov 22, 1960, 60 y.o.   MRN: [id number]  HPI  Jacob Ali is a very pleasant 60 y.o. male who presents today for suture removal.  Evaluated at [facility] ED on 03/16/2020 for linear 6 cm laceration to frontal scalp after a fall.  Work-up in the ED including initial CT head showed atrophy with small vessel chronic ischemic changes, acute subdural hemorrhage left temporal lobe, hairline fracture to left frontal skull.  Neurosurgery was consulted who recommended repeat CT scan of head 6 hours later, holding Plavix for 7 days, and follow-up in the outpatient setting with neurosurgery clinic in 2 to 4 weeks.  7 sutures were placed, simple interrupted to frontal scalp. repeat CT head showed unchanged subdural hemorrhage to the left temporal lobe without midline shift.  He was discharged home.  Since his ED visit he continues to notice dizziness, mostly with positional changes, no worse than before. He has an appointment with the neurosurgeon for 04/05/20. He denies increased headaches, he does have mild, intermittent parietal headaches, resolved with Tylenol. He denies visual changes, increase facial or cranial swelling, erythema to laceration site.   He is also needing a work note discussing restrictions. He was told in the ED not to lift over 5 pounds. He works at [employer], sedentary at [employer] for most of his work day.   Review of Systems  Constitutional: Negative for fever.  Skin: Negative for color change.  Neurological: Positive for dizziness and headaches.         Past Medical History:  Diagnosis Date  . Depression   . GERD (gastroesophageal reflux disease)   . HIV infection ([place])   . Hyperlipidemia   . Hypertension   . Peripheral vascular disease ([place])   . PONV (postoperative nausea and vomiting)   . Sleep apnea   . Tubular adenoma of colon 11/03/2017   Colonoscopy Sept 9, 2019    Social History   Socioeconomic History  .  Marital status: Single    Spouse name: Not on file  . Number of children: Not on file  . Years of education: 72  . Highest education level: Not on file  Occupational History  . Occupation: Recruitment consultant: [employer]  Tobacco Use  . Smoking status: Current Every Day Smoker    Packs/day: 0.50    Years: 40.00    Pack years: 20.00    Types: Cigars  . Smokeless tobacco: Never Used  . Tobacco comment: 1/2PPD  Substance and Sexual Activity  . Alcohol use: Yes    Alcohol/week: 3.0 standard drinks    Types: 3 Standard drinks or equivalent per week    Comment: 2-3 times a week  . Drug use: Yes    Frequency: 2.0 times per week    Types: Marijuana  . Sexual activity: Not Currently    Partners: Male    Birth control/protection: Condom    Comment: pt. given condoms  Other Topics Concern  . Not on file  Social History Narrative   Single, lives alone.     Social Determinants of Health   Financial Resource Strain: Not on file  Food Insecurity: Not on file  Transportation Needs: Not on file  Physical Activity: Not on file  Stress: Not on file  Social Connections: Not on file  Intimate Partner Violence: Not on file    Past Surgical History:  Procedure Laterality Date  . Examination  under anesthesia, repair of anal fissure,  12/04/2000  . PERIPHERAL VASCULAR CATHETERIZATION Left 02/06/2016   Procedure: Lower Extremity Angiography;  Surgeon: Katha Cabal, MD;  Location: [place] [facility];  Service: Cardiovascular;  Laterality: Left;  . PERIPHERAL VASCULAR CATHETERIZATION N/A 02/06/2016   Procedure: Abdominal Aortogram w/Lower Extremity;  Surgeon: Katha Cabal, MD;  Location: [place] [facility];  Service: Cardiovascular;  Laterality: N/A;  . PERIPHERAL VASCULAR CATHETERIZATION  02/06/2016   Procedure: Lower Extremity Intervention;  Surgeon: Katha Cabal, MD;  Location: [place] [facility];  Service: Cardiovascular;;    Family History  Problem  Relation Age of Onset  . Throat cancer Father   . Heart disease Father   . Cancer Father        bladder  . Cancer Maternal Grandmother        colon    Allergies  Allergen Reactions  . Tamiflu [Oseltamivir Phosphate] Other (See Comments)    Depressive symptoms    Current Outpatient Medications on File Prior to Visit  Medication Sig Dispense Refill  . ACCU-CHEK GUIDE test strip USE UP TO 4 TIMES A DAY AS DIRECTED 100 strip 5  . Accu-Chek Softclix Lancets lancets USE UP TO 4 TIMES A DAY AS DIRECTED 100 each 5  . atorvastatin (LIPITOR) 20 MG tablet Take 1 tablet (20 mg total) by mouth every evening. For cholesterol. (Patient taking differently: Take 20 mg by mouth daily.) 90 tablet 3  . BIKTARVY 50-200-25 MG TABS tablet TAKE 1 TABLET BY MOUTH DAILY. (Patient taking differently: Take 1 tablet by mouth daily.) 30 tablet 5  . clopidogrel (PLAVIX) 75 MG tablet TAKE 1 TABLET BY MOUTH EVERY DAY (Patient taking differently: Take 75 mg by mouth daily.) 90 tablet 2  . fluticasone (FLONASE) 50 MCG/ACT nasal spray Place 1 spray into both nostrils daily as needed for allergies or rhinitis.    Marland Kitchen glucosamine-chondroitin 500-400 MG tablet Take 1 tablet by mouth daily.    Marland Kitchen ibuprofen (ADVIL) 800 MG tablet Take 1 tablet (800 mg total) by mouth daily as needed for moderate pain. 90 tablet 2  . lisinopril-hydrochlorothiazide (ZESTORETIC) 20-25 MG tablet Take 1 tablet by mouth daily. 90 tablet 2  . metFORMIN (GLUCOPHAGE-XR) 500 MG 24 hr tablet Take 500 mg by mouth daily with breakfast.    . pantoprazole (PROTONIX) 40 MG tablet TAKE 1 TABLET BY MOUTH EVERY DAY (Patient taking differently: Take 40 mg by mouth daily.) 90 tablet 1  . tadalafil (CIALIS) 5 MG tablet TAKE 1 TABLET EVERY DAY AS NEEDED FOR ERECTILE DYSFUNCTION (Patient taking differently: Take 5 mg by mouth daily as needed for erectile dysfunction.) 10 tablet 1  . triamcinolone (NASACORT ALLERGY 24HR) 55 MCG/ACT AERO nasal inhaler Place 1 spray into the  nose daily as needed (allergies).     No current facility-administered medications on file prior to visit.    There were no vitals taken for this visit. Objective:   Physical Exam Pulmonary:     Effort: Pulmonary effort is normal.  Skin:    General: Skin is warm and dry.     Findings: No erythema.     Comments: 7 cm linear laceration to left parietal lobe, scabbing intact, no drainage/surrounding erythema. Mild swelling to left frontal lobe at beginning of laceration.  Neurological:     Mental Status: He is alert.           Assessment & Plan:      This visit occurred during the SARS-CoV-2  public health emergency.  Safety protocols were in place, including screening questions prior to the visit, additional usage of staff PPE, and extensive cleaning of exam room while observing appropriate contact time as indicated for disinfecting solutions.

## 2020-03-28 ENCOUNTER — Telehealth: Payer: Self-pay | Admitting: *Deleted

## 2020-03-28 NOTE — Telephone Encounter (Signed)
Paent dropped off forms for completion by Dr Johnnye Sima, needs faxed by 3/18. Copy placed in triage, original in Dr Algis Downs box. Landis Gandy, RN

## 2020-03-29 NOTE — Telephone Encounter (Signed)
RN faxed completed forms with receipt of successful fax transmission. Placed copy in batch scan to be scanned into chart.   Attempted to call patient to determine if he would like to pick up the original copy in person or if he would like them mailed to his house. No answer, RN left HIPAA compliant voicemail requesting call back.   Beryle Flock, RN

## 2020-03-30 NOTE — Telephone Encounter (Signed)
RN sealed patient's forms in envelope marked confidential and placed in outgoing mail box. Used address provided by patient and listed in chart.   Beryle Flock, RN

## 2020-04-03 NOTE — Telephone Encounter (Signed)
Received forms from [facility] requesting clarification on patient's medical leave application. Forms requesting clarification on duration/ frequency regarding appointments with office. Will need this information before application can move forward. Will leave copy in providers box and original in triage.

## 2020-04-04 NOTE — Telephone Encounter (Signed)
Received call from patient, RN advised him that [facility] faxed a request for clarification and that this will need to be completed before the application can move forward. Patient verbalized understanding and has no further questions.    Beryle Flock, RN

## 2020-04-04 NOTE — Telephone Encounter (Signed)
error 

## 2020-04-05 NOTE — Progress Notes (Deleted)
MRN : [id number]  Jacob Ali is a 60 y.o. (10-01-1960) male who presents with chief complaint of No chief complaint on file. Marland Kitchen  History of Present Illness:   The patient returns to the office for followup and review of the noninvasive studies. There have been no interval changes in lower extremity symptoms. No interval shortening of the patient's claudication distance or development of rest pain symptoms. No new ulcers or wounds have occurred since the last visit.  There have been no significant changes to the patient's overall health care.  The patient denies amaurosis fugax or recent TIA symptoms. There are no recent neurological changes noted. The patient denies history of DVT, PE or superficial thrombophlebitis. The patient denies recent episodes of angina or shortness of breath.   ABI Rt=0.97and Lt=0.83(previous ABI's Rt=0.87and Lt=0.84) Previous duplex ultrasound of theleft leg shows moderat restenosis of the stent in the SFA >50%  No outpatient medications have been marked as taking for the 04/06/20 encounter (Appointment) with Delana Meyer, Dolores Lory, MD.    Past Medical History:  Diagnosis Date  . Depression   . GERD (gastroesophageal reflux disease)   . HIV infection ([place])   . Hyperlipidemia   . Hypertension   . Peripheral vascular disease ([place])   . PONV (postoperative nausea and vomiting)   . Sleep apnea   . Subdural hemorrhage ([place])   . Tubular adenoma of colon 11/03/2017   Colonoscopy Sept 9, 2019    Past Surgical History:  Procedure Laterality Date  . Examination under anesthesia, repair of anal fissure,  12/04/2000  . PERIPHERAL VASCULAR CATHETERIZATION Left 02/06/2016   Procedure: Lower Extremity Angiography;  Surgeon: Katha Cabal, MD;  Location: [facility];  Service: Cardiovascular;  Laterality: Left;  . PERIPHERAL VASCULAR CATHETERIZATION N/A 02/06/2016   Procedure: Abdominal Aortogram w/Lower Extremity;  Surgeon: Katha Cabal, MD;  Location: [facility];  Service: Cardiovascular;  Laterality: N/A;  . PERIPHERAL VASCULAR CATHETERIZATION  02/06/2016   Procedure: Lower Extremity Intervention;  Surgeon: Katha Cabal, MD;  Location: [facility];  Service: Cardiovascular;;    Social History Social History   Tobacco Use  . Smoking status: Current Every Day Smoker    Packs/day: 0.50    Years: 40.00    Pack years: 20.00    Types: Cigars  . Smokeless tobacco: Never Used  . Tobacco comment: 1/2PPD  Substance Use Topics  . Alcohol use: Yes    Alcohol/week: 3.0 standard drinks    Types: 3 Standard drinks or equivalent per week    Comment: 2-3 times a week  . Drug use: Yes    Frequency: 2.0 times per week    Types: Marijuana    Family History Family History  Problem Relation Age of Onset  . Throat cancer Father   . Heart disease Father   . Cancer Father        bladder  . Cancer Maternal Grandmother        colon    Allergies  Allergen Reactions  . Tamiflu [Oseltamivir Phosphate] Other (See Comments)    Depressive symptoms     REVIEW OF SYSTEMS (Negative unless checked)  Constitutional: [] Weight loss  [] Fever  [] Chills Cardiac: [] Chest pain   [] Chest pressure   [] Palpitations   [] Shortness of breath when laying flat   [] Shortness of breath with exertion. Vascular:  [x] Pain in legs with walking   [] Pain in legs at rest  [] History of DVT   []   Phlebitis   [] Swelling in legs   [] Varicose veins   [] Non-healing ulcers Pulmonary:   [] Uses home oxygen   [] Productive cough   [] Hemoptysis   [] Wheeze  [] COPD   [] Asthma Neurologic:  [] Dizziness   [] Seizures   [] History of stroke   [] History of TIA  [] Aphasia   [] Vissual changes   [] Weakness or numbness in arm   [] Weakness or numbness in leg Musculoskeletal:   [] Joint swelling   [] Joint pain   [] Low back pain Hematologic:  [] Easy bruising  [] Easy bleeding   [] Hypercoagulable state   [] Anemic Gastrointestinal:  [] Diarrhea   [] Vomiting   [x] Gastroesophageal reflux/heartburn   [] Difficulty swallowing. Genitourinary:  [] Chronic kidney disease   [] Difficult urination  [] Frequent urination   [] Blood in urine Skin:  [] Rashes   [] Ulcers  Psychological:  [] History of anxiety   []  History of major depression.  Physical Examination  There were no vitals filed for this visit. There is no height or weight on file to calculate BMI. Gen: WD/WN, NAD Head: [place]/AT, No temporalis wasting.  Ear/Nose/Throat: Hearing grossly intact, nares w/o erythema or drainage Eyes: PER, EOMI, sclera nonicteric.  Neck: Supple, no large masses.   Pulmonary:  Good air movement, no audible wheezing bilaterally, no use of accessory muscles.  Cardiac: RRR, no JVD Vascular:  Vessel Right Left  Radial Palpable Palpable  PT Palpable Palpable  DP Palpable Palpable  Gastrointestinal: Non-distended. No guarding/no peritoneal signs.  Musculoskeletal: M/S 5/5 throughout.  No deformity or atrophy.  Neurologic: CN 2-12 intact. Symmetrical.  Speech is fluent. Motor exam as listed above. Psychiatric: Judgment intact, Mood & affect appropriate for pt's clinical situation. Dermatologic: No rashes or ulcers noted.  No changes consistent with cellulitis. Lymph : No lichenification or skin changes of chronic lymphedema.  CBC Lab Results  Component Value Date   WBC 13.1 (H) 03/16/2020   HGB 12.9 (L) 03/16/2020   HCT 38.8 (L) 03/16/2020   MCV 101.3 (H) 03/16/2020   PLT 231 03/16/2020    BMET    Component Value Date/Time   NA 137 03/16/2020 1237   NA 132 (L) 06/06/2016 0941   K 3.8 03/16/2020 1237   CL 103 03/16/2020 1237   CO2 24 03/16/2020 1237   GLUCOSE 95 03/16/2020 1237   BUN 11 03/16/2020 1237   BUN 16 06/06/2016 0941   CREATININE 0.99 03/16/2020 1237   CREATININE 1.08 11/03/2019 1544   CALCIUM 9.5 03/16/2020 1237   GFRNONAA >60 03/16/2020 1237   GFRNONAA >60 10/16/2010 1641   GFRAA >60 05/22/2017 1222   GFRAA >60 10/16/2010 1641   Estimated  Creatinine Clearance: 78.4 mL/min (by C-G formula based on SCr of 0.99 mg/dL).  COAG Lab Results  Component Value Date   INR 1.20 02/23/2011   INR 1.27 02/23/2011   INR 1.22 02/22/2011    Radiology CT Head Wo Contrast  Result Date: 03/16/2020 CLINICAL DATA:  Follow-up subdural EXAM: CT HEAD WITHOUT CONTRAST TECHNIQUE: Contiguous axial images were obtained from the base of the skull through the vertex without intravenous contrast. COMPARISON:  March 16, 2020 9:56 a.m. FINDINGS: Brain: Again noted is a tiny focus of subdural hemorrhage overlying the inferior left temporal lobe which is unchanged from the prior exam measuring maximum dimension of 4 mm in transverse dimension. No new extra-axial collections or midline shift. There is mild low-attenuation changes in the deep white matter consistent with small vessel ischemia. Ventricles are normal in size and contour. Vascular: No hyperdense vessel or unexpected calcification. Skull: Again  noted is a nondisplaced left frontal skull fracture. Sinuses/Orbits: The visualized paranasal sinuses and mastoid air cells are clear. The orbits and globes intact. Other: Interval slight decrease in the soft tissue hematoma overlying the left frontal skull. IMPRESSION: Unchanged tiny focus of subdural hemorrhage overlying the inferior left temporal lobe measuring 4 mm. No midline shift. Findings consistent with mild chronic small vessel ischemia Electronically Signed   By: Prudencio Pair M.D.   On: 03/16/2020 17:46   CT Head Wo Contrast  Result Date: 03/16/2020 CLINICAL DATA:  Tripped and fell striking head against bathroom vanity, denies loss of consciousness, frontal hematoma and laceration EXAM: CT HEAD WITHOUT CONTRAST TECHNIQUE: Contiguous axial images were obtained from the base of the skull through the vertex without intravenous contrast. Sagittal and coronal MPR images reconstructed from axial data set. COMPARISON:  None FINDINGS: Brain: Minimal atrophy. Normal  ventricular morphology. No midline shift or mass effect. Small vessel chronic ischemic changes of deep cerebral white matter. Small focus of high attenuation extra-axial at LEFT temporal lobe consistent with minimal subdural hemorrhage, best visualized on coronal images. No intraparenchymal hemorrhage, mass lesion or evidence of acute infarction. No extra-axial fluid collections. Vascular: No hyperdense vessels Skull: LEFT scalp hematoma. Nondisplaced LEFT frontal skull fracture. Sinuses/Orbits: Paranasal sinuses and mastoid air cells clear Other: N/A IMPRESSION: Atrophy with small vessel chronic ischemic changes of deep cerebral white matter. Small focus of acute subdural hemorrhage at LEFT temporal lobe. Nondisplaced LEFT frontal skull fracture. Critical Value/emergent results were called by telephone at the time of interpretation on 03/16/2020 at 1022 hours to provider DR. DANIELLE RAY, who verbally acknowledged these results. Electronically Signed   By: Lavonia Dana M.D.   On: 03/16/2020 10:23      Assessment/Plan There are no diagnoses linked to this encounter.   Hortencia Pilar, MD  04/05/2020 1:40 PM

## 2020-04-06 ENCOUNTER — Encounter (INDEPENDENT_AMBULATORY_CARE_PROVIDER_SITE_OTHER): Payer: Self-pay

## 2020-04-06 ENCOUNTER — Ambulatory Visit (INDEPENDENT_AMBULATORY_CARE_PROVIDER_SITE_OTHER): Payer: PRIVATE HEALTH INSURANCE | Admitting: Vascular Surgery

## 2020-04-06 ENCOUNTER — Other Ambulatory Visit (INDEPENDENT_AMBULATORY_CARE_PROVIDER_SITE_OTHER): Payer: Self-pay | Admitting: Vascular Surgery

## 2020-04-06 ENCOUNTER — Ambulatory Visit (INDEPENDENT_AMBULATORY_CARE_PROVIDER_SITE_OTHER): Payer: PRIVATE HEALTH INSURANCE

## 2020-04-06 ENCOUNTER — Other Ambulatory Visit: Payer: Self-pay

## 2020-04-06 DIAGNOSIS — I739 Peripheral vascular disease, unspecified: Secondary | ICD-10-CM

## 2020-04-07 DIAGNOSIS — S062XAA Diffuse traumatic brain injury with loss of consciousness status unknown, initial encounter: Secondary | ICD-10-CM

## 2020-04-07 DIAGNOSIS — S062X9A Diffuse traumatic brain injury with loss of consciousness of unspecified duration, initial encounter: Secondary | ICD-10-CM | POA: Insufficient documentation

## 2020-04-07 DIAGNOSIS — Z6827 Body mass index (BMI) 27.0-27.9, adult: Secondary | ICD-10-CM | POA: Insufficient documentation

## 2020-04-07 HISTORY — DX: Diffuse traumatic brain injury with loss of consciousness status unknown, initial encounter: S06.2XAA

## 2020-04-11 ENCOUNTER — Other Ambulatory Visit (HOSPITAL_COMMUNITY): Payer: Self-pay

## 2020-04-15 ENCOUNTER — Other Ambulatory Visit (HOSPITAL_COMMUNITY): Payer: Self-pay

## 2020-04-15 MED FILL — Bictegravir-Emtricitabine-Tenofovir AF Tab 50-200-25 MG: ORAL | 30 days supply | Qty: 30 | Fill #0 | Status: AC

## 2020-04-17 ENCOUNTER — Other Ambulatory Visit (HOSPITAL_COMMUNITY): Payer: Self-pay

## 2020-04-18 ENCOUNTER — Encounter (INDEPENDENT_AMBULATORY_CARE_PROVIDER_SITE_OTHER): Payer: Self-pay | Admitting: *Deleted

## 2020-04-19 ENCOUNTER — Other Ambulatory Visit (HOSPITAL_COMMUNITY): Payer: Self-pay

## 2020-05-11 ENCOUNTER — Other Ambulatory Visit (HOSPITAL_COMMUNITY): Payer: Self-pay

## 2020-05-11 MED FILL — Bictegravir-Emtricitabine-Tenofovir AF Tab 50-200-25 MG: ORAL | 30 days supply | Qty: 30 | Fill #1 | Status: AC

## 2020-05-17 ENCOUNTER — Other Ambulatory Visit (HOSPITAL_COMMUNITY): Payer: Self-pay

## 2020-05-30 ENCOUNTER — Other Ambulatory Visit: Payer: Self-pay

## 2020-05-30 ENCOUNTER — Ambulatory Visit: Payer: PRIVATE HEALTH INSURANCE | Admitting: Primary Care

## 2020-05-30 ENCOUNTER — Encounter: Payer: Self-pay | Admitting: Primary Care

## 2020-05-30 VITALS — BP 125/64 | HR 64 | Temp 98.6°F | Ht 69.0 in | Wt 182.0 lb

## 2020-05-30 DIAGNOSIS — Z125 Encounter for screening for malignant neoplasm of prostate: Secondary | ICD-10-CM | POA: Diagnosis not present

## 2020-05-30 DIAGNOSIS — E119 Type 2 diabetes mellitus without complications: Secondary | ICD-10-CM

## 2020-05-30 DIAGNOSIS — I10 Essential (primary) hypertension: Secondary | ICD-10-CM | POA: Diagnosis not present

## 2020-05-30 DIAGNOSIS — B20 Human immunodeficiency virus [HIV] disease: Secondary | ICD-10-CM

## 2020-05-30 DIAGNOSIS — K219 Gastro-esophageal reflux disease without esophagitis: Secondary | ICD-10-CM

## 2020-05-30 DIAGNOSIS — E785 Hyperlipidemia, unspecified: Secondary | ICD-10-CM

## 2020-05-30 DIAGNOSIS — I70219 Atherosclerosis of native arteries of extremities with intermittent claudication, unspecified extremity: Secondary | ICD-10-CM | POA: Diagnosis not present

## 2020-05-30 LAB — POCT GLYCOSYLATED HEMOGLOBIN (HGB A1C): Hemoglobin A1C: 4.6 % (ref 4.0–5.6)

## 2020-05-30 NOTE — Assessment & Plan Note (Signed)
Stable on atorvastatin 20 mg, continue same. Labs from Fall 2021 reviewed.  Repeat labs pending for Fall 2022.

## 2020-05-30 NOTE — Patient Instructions (Addendum)
Continue to work on [employer].  Continue exercising. You should be getting 150 minutes of moderate intensity exercise weekly.  Have Dr. Johnnye Sima draw your labs in November.  It was a pleasure to see you today!

## 2020-05-30 NOTE — Assessment & Plan Note (Signed)
Circulation appears well.  Continue clopidogrel 75 mg and atorvastatin 20 mg. Lipid panel reviewed from October 2021, repeat again this Fall.

## 2020-05-30 NOTE — Assessment & Plan Note (Signed)
Doing well on pantoprazole 40 mg, continue same.

## 2020-05-30 NOTE — Assessment & Plan Note (Signed)
Well controlled in the office today. Continue lisinopril-HCTZ 20-25 mg, CMP reviewed.

## 2020-05-30 NOTE — Assessment & Plan Note (Signed)
Well controlled with A1C today of 4.6. He's been very stable over the last year off medications.  Continue off medications.   Foot exam today. Managed on statin and ACE-I.  Pneumonia vaccine UTD.  Repeat A1C in 6 months with infectious disease.

## 2020-05-30 NOTE — Progress Notes (Signed)
Subjective:    Patient ID: Jacob Ali, male    DOB: 09-Jan-1961, 60 y.o.   MRN: [id number]  HPI  Jacob Ali is a very pleasant 60 y.o. male with a history of hypertension, atherosclerosis with claudication, type 2 diabetes, hyperlipidemia, HIV, tobacco abuse who presents today for follow up of chronic conditions.  1) Type 2 Diabetes:  Current medications include: None.  He is checking his blood glucose numerous times daily and is getting readings of: 110's-120's  Last A1C: 4.9 in November 2021, 4.6 today Last Eye Exam: Due Last Foot Exam: Due Pneumonia Vaccination: UTD ACE/ARB: Lisinopril Statin: Lipitor   2) Essential Hypertension/Atherosclerosis of extremity: Currently managed on lisinopril-HCTZ 20-25 mg daily, clopidogrel 75 mg, atorvastatin 20 mg.   3) GERD: Currently managed on pantoprazole 40 mg. Doing well on this regimen.  4) HIV: Currently managed on Biktarvy 50-200-25 mg. Following with infectious disease, last visit was November 2021, CD4 count was undetectable at that time. Follows annually.   BP Readings from Last 3 Encounters:  05/30/20 125/64  03/23/20 130/62  03/16/20 125/74      Review of Systems  Eyes: Negative for visual disturbance.  Respiratory: Negative for shortness of breath.   Cardiovascular: Negative for chest pain.  Neurological: Negative for dizziness and numbness.         Past Medical History:  Diagnosis Date  . Depression   . GERD (gastroesophageal reflux disease)   . HIV infection ([place])   . Hyperlipidemia   . Hypertension   . Peripheral vascular disease ([place])   . PONV (postoperative nausea and vomiting)   . Sleep apnea   . Subdural hemorrhage ([place])   . Tubular adenoma of colon 11/03/2017   Colonoscopy Sept 9, 2019    Social History   Socioeconomic History  . Marital status: Single    Spouse name: Not on file  . Number of children: Not on file  . Years of education: 13  . Highest education level: Not on file   Occupational History  . Occupation: Recruitment consultant: [employer]  Tobacco Use  . Smoking status: Current Every Day Smoker    Packs/day: 0.50    Years: 40.00    Pack years: 20.00    Types: Cigars  . Smokeless tobacco: Never Used  . Tobacco comment: 1/2PPD  Substance and Sexual Activity  . Alcohol use: Yes    Alcohol/week: 3.0 standard drinks    Types: 3 Standard drinks or equivalent per week    Comment: 2-3 times a week  . Drug use: Yes    Frequency: 2.0 times per week    Types: Marijuana  . Sexual activity: Not Currently    Partners: Male    Birth control/protection: Condom    Comment: pt. given condoms  Other Topics Concern  . Not on file  Social History Narrative   Single, lives alone.     Social Determinants of Health   Financial Resource Strain: Not on file  Food Insecurity: Not on file  Transportation Needs: Not on file  Physical Activity: Not on file  Stress: Not on file  Social Connections: Not on file  Intimate Partner Violence: Not on file    Past Surgical History:  Procedure Laterality Date  . Examination under anesthesia, repair of anal fissure,  12/04/2000  . PERIPHERAL VASCULAR CATHETERIZATION Left 02/06/2016   Procedure: Lower Extremity Angiography;  Surgeon: Katha Cabal, MD;  Location: [facility];  Service: Cardiovascular;  Laterality: Left;  . PERIPHERAL VASCULAR CATHETERIZATION N/A 02/06/2016   Procedure: Abdominal Aortogram w/Lower Extremity;  Surgeon: Katha Cabal, MD;  Location: [facility];  Service: Cardiovascular;  Laterality: N/A;  . PERIPHERAL VASCULAR CATHETERIZATION  02/06/2016   Procedure: Lower Extremity Intervention;  Surgeon: Katha Cabal, MD;  Location: [facility];  Service: Cardiovascular;;    Family History  Problem Relation Age of Onset  . Throat cancer Father   . Heart disease Father   . Cancer Father        bladder  . Cancer Maternal Grandmother        colon     Allergies  Allergen Reactions  . Tamiflu [Oseltamivir Phosphate] Other (See Comments)    Depressive symptoms    Current Outpatient Medications on File Prior to Visit  Medication Sig Dispense Refill  . ACCU-CHEK GUIDE test strip USE UP TO 4 TIMES A DAY AS DIRECTED 100 strip 5  . Accu-Chek Softclix Lancets lancets USE UP TO 4 TIMES A DAY AS DIRECTED 100 each 5  . atorvastatin (LIPITOR) 20 MG tablet Take 1 tablet (20 mg total) by mouth every evening. For cholesterol. (Patient taking differently: Take 20 mg by mouth daily.) 90 tablet 3  . bictegravir-emtricitabine-tenofovir AF (BIKTARVY) 50-200-25 MG TABS tablet TAKE 1 TABLET BY MOUTH DAILY. 30 tablet 5  . clopidogrel (PLAVIX) 75 MG tablet TAKE 1 TABLET BY MOUTH EVERY DAY (Patient taking differently: Take 75 mg by mouth daily.) 90 tablet 2  . fluticasone (FLONASE) 50 MCG/ACT nasal spray Place 1 spray into both nostrils daily as needed for allergies or rhinitis.    Marland Kitchen glucosamine-chondroitin 500-400 MG tablet Take 1 tablet by mouth daily.    Marland Kitchen ibuprofen (ADVIL) 800 MG tablet Take 1 tablet (800 mg total) by mouth daily as needed for moderate pain. 90 tablet 2  . lisinopril-hydrochlorothiazide (ZESTORETIC) 20-25 MG tablet Take 1 tablet by mouth daily. 90 tablet 2  . pantoprazole (PROTONIX) 40 MG tablet TAKE 1 TABLET BY MOUTH EVERY DAY (Patient taking differently: Take 40 mg by mouth daily.) 90 tablet 1   No current facility-administered medications on file prior to visit.    BP 125/64   Pulse 64   Temp 98.6 F (37 C) (Temporal)   Ht 5\' 9"  (1.753 m)   Wt 182 lb (82.6 kg)   SpO2 96%   BMI 26.88 kg/m  Objective:   Physical Exam Cardiovascular:     Rate and Rhythm: Normal rate and regular rhythm.  Pulmonary:     Effort: Pulmonary effort is normal.     Breath sounds: Normal breath sounds. No wheezing or rales.  Musculoskeletal:     Cervical back: Neck supple.  Skin:    General: Skin is warm and dry.  Neurological:     Mental  Status: He is alert and oriented to person, place, and time.           Assessment & Plan:      This visit occurred during the SARS-CoV-2 public health emergency.  Safety protocols were in place, including screening questions prior to the visit, additional usage of staff PPE, and extensive cleaning of exam room while observing appropriate contact time as indicated for disinfecting solutions.

## 2020-05-30 NOTE — Assessment & Plan Note (Signed)
Compliant to Biktarvy 50-200-25 mg daily, continue same. Labs from November 2021 reviewed and are stable.

## 2020-06-08 ENCOUNTER — Other Ambulatory Visit (HOSPITAL_COMMUNITY): Payer: Self-pay

## 2020-06-13 ENCOUNTER — Other Ambulatory Visit (HOSPITAL_COMMUNITY): Payer: Self-pay

## 2020-06-13 MED FILL — Bictegravir-Emtricitabine-Tenofovir AF Tab 50-200-25 MG: ORAL | 30 days supply | Qty: 30 | Fill #2 | Status: AC

## 2020-06-19 ENCOUNTER — Other Ambulatory Visit (HOSPITAL_COMMUNITY): Payer: Self-pay

## 2020-06-26 ENCOUNTER — Ambulatory Visit: Payer: Self-pay | Admitting: *Deleted

## 2020-06-26 ENCOUNTER — Other Ambulatory Visit: Payer: Self-pay

## 2020-06-26 VITALS — BP 142/82 | Ht 68.0 in | Wt 179.0 lb

## 2020-06-26 DIAGNOSIS — Z0000 Encounter for general adult medical examination without abnormal findings: Secondary | ICD-10-CM

## 2020-06-26 NOTE — Progress Notes (Signed)
[employer] insurance premium discount evaluation: Labs Drawn. Replacements ROI form signed. Tobacco Free Attestation form signed.  Forms placed in paper chart.  

## 2020-06-27 ENCOUNTER — Encounter: Payer: Self-pay | Admitting: Registered Nurse

## 2020-06-27 LAB — CMP12+LP+TP+TSH+6AC+PSA+CBC…
ALT: 18 IU/L (ref 0–44)
AST: 20 IU/L (ref 0–40)
Albumin/Globulin Ratio: 2.3 — ABNORMAL HIGH (ref 1.2–2.2)
Albumin: 5.1 g/dL — ABNORMAL HIGH (ref 3.8–4.8)
Alkaline Phosphatase: 65 IU/L (ref 44–121)
BUN/Creatinine Ratio: 22 (ref 10–24)
BUN: 22 mg/dL (ref 8–27)
Basophils Absolute: 0.1 10*3/uL (ref 0.0–0.2)
Basos: 1 %
Bilirubin Total: 0.5 mg/dL (ref 0.0–1.2)
Calcium: 9.5 mg/dL (ref 8.6–10.2)
Chloride: 104 mmol/L (ref 96–106)
Chol/HDL Ratio: 4.8 ratio (ref 0.0–5.0)
Cholesterol, Total: 140 mg/dL (ref 100–199)
Creatinine, Ser: 1.01 mg/dL (ref 0.76–1.27)
EOS (ABSOLUTE): 0.2 10*3/uL (ref 0.0–0.4)
Eos: 2 %
Estimated CHD Risk: 1 times avg. (ref 0.0–1.0)
Free Thyroxine Index: 1.5 (ref 1.2–4.9)
GGT: 44 IU/L (ref 0–65)
Globulin, Total: 2.2 g/dL (ref 1.5–4.5)
Glucose: 122 mg/dL — ABNORMAL HIGH (ref 65–99)
HDL: 29 mg/dL — ABNORMAL LOW (ref >39)
Hematocrit: 39.2 % (ref 37.5–51.0)
Hemoglobin: 13 g/dL (ref 13.0–17.7)
Immature Grans (Abs): 0.1 10*3/uL (ref 0.0–0.1)
Immature Granulocytes: 1 %
Iron: 86 ug/dL (ref 38–169)
LDH: 230 IU/L — ABNORMAL HIGH (ref 121–224)
LDL Chol Calc (NIH): 86 mg/dL (ref 0–99)
Lymphocytes Absolute: 3.2 10*3/uL — ABNORMAL HIGH (ref 0.7–3.1)
Lymphs: 33 %
MCH: 34 pg — ABNORMAL HIGH (ref 26.6–33.0)
MCHC: 33.2 g/dL (ref 31.5–35.7)
MCV: 103 fL — ABNORMAL HIGH (ref 79–97)
Monocytes Absolute: 0.7 10*3/uL (ref 0.1–0.9)
Monocytes: 7 %
Neutrophils Absolute: 5.3 10*3/uL (ref 1.4–7.0)
Neutrophils: 56 %
Phosphorus: 3.3 mg/dL (ref 2.8–4.1)
Platelets: 200 10*3/uL (ref 150–450)
Potassium: 4.2 mmol/L (ref 3.5–5.2)
Prostate Specific Ag, Serum: 1.1 ng/mL (ref 0.0–4.0)
RBC: 3.82 x10E6/uL — ABNORMAL LOW (ref 4.14–5.80)
RDW: 11.8 % (ref 11.6–15.4)
Sodium: 135 mmol/L (ref 134–144)
T3 Uptake Ratio: 25 % (ref 24–39)
T4, Total: 5.9 ug/dL (ref 4.5–12.0)
TSH: 1.57 u[IU]/mL (ref 0.450–4.500)
Total Protein: 7.3 g/dL (ref 6.0–8.5)
Triglycerides: 138 mg/dL (ref 0–149)
Uric Acid: 5.3 mg/dL (ref 3.8–8.4)
VLDL Cholesterol Cal: 25 mg/dL (ref 5–40)
WBC: 9.5 10*3/uL (ref 3.4–10.8)
eGFR: 85 mL/min/{1.73_m2} (ref >59)

## 2020-06-27 LAB — HGB A1C W/O EAG: Hgb A1c MFr Bld: 4.4 % — ABNORMAL LOW (ref 4.8–5.6)

## 2020-07-01 ENCOUNTER — Other Ambulatory Visit (INDEPENDENT_AMBULATORY_CARE_PROVIDER_SITE_OTHER): Payer: Self-pay | Admitting: Nurse Practitioner

## 2020-07-01 ENCOUNTER — Other Ambulatory Visit: Payer: Self-pay | Admitting: Infectious Diseases

## 2020-07-01 DIAGNOSIS — K219 Gastro-esophageal reflux disease without esophagitis: Secondary | ICD-10-CM

## 2020-07-03 ENCOUNTER — Ambulatory Visit: Payer: Self-pay | Admitting: *Deleted

## 2020-07-03 ENCOUNTER — Other Ambulatory Visit: Payer: Self-pay

## 2020-07-03 DIAGNOSIS — D649 Anemia, unspecified: Secondary | ICD-10-CM

## 2020-07-03 NOTE — Progress Notes (Signed)
B12 drawn per pcp orders.

## 2020-07-04 LAB — VITAMIN B12: Vitamin B-12: 406 pg/mL (ref 232–1245)

## 2020-07-05 ENCOUNTER — Other Ambulatory Visit: Payer: Self-pay | Admitting: Primary Care

## 2020-07-05 DIAGNOSIS — K219 Gastro-esophageal reflux disease without esophagitis: Secondary | ICD-10-CM

## 2020-07-12 ENCOUNTER — Other Ambulatory Visit (HOSPITAL_COMMUNITY): Payer: Self-pay

## 2020-07-12 MED FILL — Bictegravir-Emtricitabine-Tenofovir AF Tab 50-200-25 MG: ORAL | 30 days supply | Qty: 30 | Fill #3 | Status: AC

## 2020-07-19 ENCOUNTER — Other Ambulatory Visit (HOSPITAL_COMMUNITY): Payer: Self-pay

## 2020-08-08 ENCOUNTER — Encounter: Payer: Self-pay | Admitting: *Deleted

## 2020-08-08 ENCOUNTER — Telehealth: Payer: Self-pay | Admitting: *Deleted

## 2020-08-08 DIAGNOSIS — Z800 Family history of malignant neoplasm of digestive organs: Secondary | ICD-10-CM | POA: Insufficient documentation

## 2020-08-08 DIAGNOSIS — K625 Hemorrhage of anus and rectum: Secondary | ICD-10-CM | POA: Insufficient documentation

## 2020-08-08 NOTE — Telephone Encounter (Signed)
Taskforce notified clinic that pt called out of work today citing cold sx. Spoke with pt by phone. He reports drainage, sore throat, runny nose, and nonproductive cough. Sx started at 0500 today.  Day 0 7/26 Day 5 7/31 Testing Day 3 7/29. Will home test.  RTW 8/1 with strict mask use thru 8/5. Sts he will probably work remotely to avoid the strict mask use at work. He will discuss this with his supervisor. He has contact information for clinic if sx worsen. Currently, he feels okay.

## 2020-08-08 NOTE — Telephone Encounter (Signed)
Reviewed RN Haley note agreed with plan of care. 

## 2020-08-09 NOTE — Telephone Encounter (Signed)
Patient contacted via telephone reported symptoms improved after sleeping most of the day yesterday and all night.  Patient reported drainage/sneezing and running nose resolved.  Just congested and cough remain.  Discussed nasal saline 2 sprays each nostril every 2 hours as needed for congestion/thick mucous and honey 1 teaspoon every 4 hours as needed for cough.  Patient doesn't like OTC medication as it makes him sleepy.  Working remote today.  Spoke full sentences without difficulty some nasal congestion noted no cough or throat clearing noted during 3 minute telephone call. A&Ox3 respirations even and unlabored.  Patient notified RN Hildred Alamin will follow up with him tomorrow via telephone.  Encouraged patient to hydrate and he stated he has honey and nasal saline at home.  HR notified working remote/continue quarantine/testing.  Patient verbalized understanding information/instructions, agreed with plan of care and had no further questions at this time.

## 2020-08-10 NOTE — Telephone Encounter (Signed)
Spoke with pt by phone. He reports sx continue improving though he didn't sleep well last night due to the cough. He did not use honey as he tries to avoid sugar/added sugars. He couldn't find his nasal saline but did take Mucinex DM and sts this helped a lot. He is continuing this. Will f/u again tomorrow for sx check and home test result.

## 2020-08-10 NOTE — Telephone Encounter (Signed)
Reviewed RN Hildred Alamin note agreed with plan of care still having cough

## 2020-08-11 NOTE — Telephone Encounter (Signed)
Reviewed RN Hildred Alamin note honey helped with cough slept well last night repeat covid test negative patient working with supervisor to continue remote work during strict mask wear period.

## 2020-08-11 NOTE — Telephone Encounter (Signed)
Spoke with pt. He reports he used nasal saline and drank honey and whiskey and "slept like a baby last night and feel great today. Think I have pulled out of this cold." Test today was negative. RTW Monday 8/1 though he is considering remote work during the strict mask use period thru 8/5 but waiting to hear from his supervisor about that. Denies questions or concerns before the weekend.

## 2020-08-13 NOTE — Telephone Encounter (Signed)
Patient returned call stated has been feeling well no concerns and supervisor approved working remote until Wednesday.  A&Ox3 spoke full sentences without difficulty sleeping well no cough/throat clearing or congestion noted during 2 minute telephone call.

## 2020-08-14 ENCOUNTER — Other Ambulatory Visit (HOSPITAL_COMMUNITY): Payer: Self-pay

## 2020-08-14 MED FILL — Bictegravir-Emtricitabine-Tenofovir AF Tab 50-200-25 MG: ORAL | 30 days supply | Qty: 30 | Fill #4 | Status: AC

## 2020-08-17 NOTE — Telephone Encounter (Signed)
Reviewed RN Hildred Alamin note patient on vacation days through 8/7 RTW expected 8/8 feeling well.

## 2020-08-17 NOTE — Telephone Encounter (Signed)
Called to confirm RTW on site today and sx check. No answer. LVMRCB.

## 2020-08-17 NOTE — Telephone Encounter (Signed)
Pt returned call reporting that he did not RTW today as expected. He took vacation days for today and tomorrow and is RTW 8/8. Feels well.

## 2020-08-21 ENCOUNTER — Other Ambulatory Visit (HOSPITAL_COMMUNITY): Payer: Self-pay

## 2020-08-25 ENCOUNTER — Other Ambulatory Visit: Payer: Self-pay

## 2020-08-25 DIAGNOSIS — M1991 Primary osteoarthritis, unspecified site: Secondary | ICD-10-CM

## 2020-08-25 DIAGNOSIS — I10 Essential (primary) hypertension: Secondary | ICD-10-CM

## 2020-08-25 MED ORDER — IBUPROFEN 800 MG PO TABS: 800.0000 mg | ORAL_TABLET | Freq: Every day | ORAL | 0 refills | Status: DC | PRN

## 2020-08-25 MED ORDER — LISINOPRIL-HYDROCHLOROTHIAZIDE 20-25 MG PO TABS: 1.0000 | ORAL_TABLET | Freq: Every day | ORAL | 0 refills | Status: DC

## 2020-08-25 NOTE — Telephone Encounter (Signed)
Called patient refill sent as requested and follow up made.

## 2020-08-27 NOTE — Telephone Encounter (Signed)
Patient contacted via telephone and he reported returned to work as expected.  All symptoms resolved after fresh [place]  Mowed lawn yesterday without difficulty.  New Rxs from his [facility] sent to clinic 08/25/20 for PDRx fill of lisinopril/hydrochlorothiazide 20/'25mg'$  po  #90 RF0 and motrin '800mg'$  #90 RF0.  Patient stated he has a couple days remaining and will pick up on 08/29/20 from clinic.  RN Hildred Alamin notified.  Patient A&Ox3 spoke full sentences without difficulty no cough/congestion/throat clearing audible during 3 minute telephone call.

## 2020-09-13 ENCOUNTER — Other Ambulatory Visit (HOSPITAL_COMMUNITY): Payer: Self-pay

## 2020-09-13 ENCOUNTER — Encounter: Payer: Self-pay | Admitting: Primary Care

## 2020-09-13 ENCOUNTER — Other Ambulatory Visit: Payer: Self-pay | Admitting: Infectious Diseases

## 2020-09-13 ENCOUNTER — Other Ambulatory Visit: Payer: Self-pay

## 2020-09-13 ENCOUNTER — Ambulatory Visit: Payer: No Typology Code available for payment source | Admitting: Primary Care

## 2020-09-13 VITALS — BP 134/78 | HR 88 | Temp 98.4°F | Ht 68.0 in | Wt 191.0 lb

## 2020-09-13 DIAGNOSIS — B20 Human immunodeficiency virus [HIV] disease: Secondary | ICD-10-CM

## 2020-09-13 DIAGNOSIS — I70219 Atherosclerosis of native arteries of extremities with intermittent claudication, unspecified extremity: Secondary | ICD-10-CM | POA: Diagnosis not present

## 2020-09-13 DIAGNOSIS — Z23 Encounter for immunization: Secondary | ICD-10-CM | POA: Diagnosis not present

## 2020-09-13 DIAGNOSIS — E785 Hyperlipidemia, unspecified: Secondary | ICD-10-CM

## 2020-09-13 DIAGNOSIS — E538 Deficiency of other specified B group vitamins: Secondary | ICD-10-CM

## 2020-09-13 LAB — LIPID PANEL
Cholesterol: 207 mg/dL — ABNORMAL HIGH (ref 0–200)
HDL: 30.6 mg/dL — ABNORMAL LOW (ref >39.00)
NonHDL: 176.04
Total CHOL/HDL Ratio: 7
Triglycerides: 379 mg/dL — ABNORMAL HIGH (ref 0.0–149.0)
VLDL: 75.8 mg/dL — ABNORMAL HIGH (ref 0.0–40.0)

## 2020-09-13 LAB — CBC
HCT: 42.7 % (ref 39.0–52.0)
Hemoglobin: 14.4 g/dL (ref 13.0–17.0)
MCHC: 33.8 g/dL (ref 30.0–36.0)
MCV: 98.9 fl (ref 78.0–100.0)
Platelets: 208 10*3/uL (ref 150.0–400.0)
RBC: 4.32 Mil/uL (ref 4.22–5.81)
RDW: 12.8 % (ref 11.5–15.5)
WBC: 7.9 10*3/uL (ref 4.0–10.5)

## 2020-09-13 LAB — VITAMIN B12: Vitamin B-12: >1550 pg/mL — ABNORMAL HIGH (ref 211–911)

## 2020-09-13 LAB — LDL CHOLESTEROL, DIRECT: Direct LDL: 124 mg/dL

## 2020-09-13 MED ORDER — BIKTARVY 50-200-25 MG PO TABS
1.0000 | ORAL_TABLET | Freq: Every day | ORAL | 3 refills | Status: DC
  Filled 2020-09-20: qty 30, 30d supply, fill #0
  Filled 2020-10-13: qty 30, 30d supply, fill #1
  Filled 2020-11-15: qty 30, 30d supply, fill #2
  Filled 2020-12-20: qty 30, 30d supply, fill #3

## 2020-09-13 NOTE — Patient Instructions (Addendum)
Great to see you today! High Cholesterol High cholesterol is a condition in which the blood has high levels of a white, waxy substance similar to fat (cholesterol). The liver makes all the cholesterol that the body needs. The human body needs small amounts of cholesterol to help build cells. A person gets extra or excess cholesterol from the food that he or she eats. The blood carries cholesterol from the liver to the rest of the body. If you have high cholesterol, deposits (plaques) may build up on the walls of your arteries. Arteries are the blood vessels that carry blood away from your heart. These plaques make the arteries narrow and stiff. Cholesterol plaques increase your risk for heart attack and stroke. Work with your health care provider to keep your cholesterol levels in a healthy range. What increases the risk? The following factors may make you more likely to develop this condition: Eating foods that are high in animal fat (saturated fat) or cholesterol. Being overweight. Not getting enough exercise. A family history of high cholesterol (familial hypercholesterolemia). Use of tobacco products. Having diabetes. What are the signs or symptoms? In most cases, high cholesterol does not usually cause any symptoms. In severe cases, very high cholesterol levels can cause: Fatty bumps under the skin (xanthomas). A white or gray ring around the black center (pupil) of the eye. How is this diagnosed? This condition may be diagnosed based on the results of a blood test. If you are older than 61 years of age, your health care provider may check your cholesterol levels every 4-6 years. You may be checked more often if you have high cholesterol or other risk factors for heart disease. The blood test for cholesterol measures: "Bad" cholesterol, or LDL cholesterol. This is the main type of cholesterol that causes heart disease. The desired level is less than 100 mg/dL (2.59 mmol/L). "Good"  cholesterol, or HDL cholesterol. HDL helps protect against heart disease by cleaning the arteries and carrying the LDL to the liver for processing. The desired level for HDL is 60 mg/dL (1.55 mmol/L) or higher. Triglycerides. These are fats that your body can store or burn for energy. The desired level is less than 150 mg/dL (1.69 mmol/L). Total cholesterol. This measures the total amount of cholesterol in your blood and includes LDL, HDL, and triglycerides. The desired level is less than 200 mg/dL (5.17 mmol/L). How is this treated? Treatment for high cholesterol starts with lifestyle changes, such as diet and exercise. Diet changes. You may be asked to eat foods that have more fiber and less saturated fats or added sugar. Lifestyle changes. These may include regular exercise, maintaining a healthy weight, and quitting use of tobacco products. Medicines. These are given when diet and lifestyle changes have not worked. You may be prescribed a statin medicine to help lower your cholesterol levels. Follow these instructions at home: Eating and drinking  Eat a healthy, balanced diet. This diet includes: Daily servings of a variety of fresh, frozen, or canned fruits and vegetables. Daily servings of whole grain foods that are rich in fiber. Foods that are low in saturated fats and trans fats. These include poultry and fish without skin, lean cuts of meat, and low-fat dairy products. A variety of fish, especially oily fish that contain omega-3 fatty acids. Aim to eat fish at least 2 times a week. Avoid foods and drinks that have added sugar. Use healthy cooking methods, such as roasting, grilling, broiling, baking, poaching, steaming, and stir-frying. Do not fry  your food except for stir-frying. If you drink alcohol: Limit how much you have to: 0-1 drink a day for women who are not pregnant. 0-2 drinks a day for men. Know how much alcohol is in a drink. In the [place] one drink equals one 12 oz bottle  of beer (355 mL), one 5 oz glass of wine (148 mL), or one 1 oz glass of hard liquor (44 mL). Lifestyle  Get regular exercise. Aim to exercise for a total of 150 minutes a week. Increase your activity level by doing activities such as gardening, walking, and taking the stairs. Do not use any products that contain nicotine or tobacco. These products include cigarettes, chewing tobacco, and vaping devices, such as e-cigarettes. If you need help quitting, ask your health care provider. General instructions Take over-the-counter and prescription medicines only as told by your health care provider. Keep all follow-up visits. This is important. Where to find more information American Heart Association: www.heart.org National Heart, Lung, and Blood Institute: https://wilson-eaton.com/ Contact a health care provider if: You have trouble achieving or maintaining a healthy diet or weight. You are starting an exercise program. You are unable to stop smoking. Get help right away if: You have chest pain. You have trouble breathing. You have discomfort or pain in your jaw, neck, back, shoulder, or arm. You have any symptoms of a stroke. "BE FAST" is an easy way to remember the main warning signs of a stroke: B - Balance. Signs are dizziness, sudden trouble walking, or loss of balance. E - Eyes. Signs are trouble seeing or a sudden change in vision. F - Face. Signs are sudden weakness or numbness of the face, or the face or eyelid drooping on one side. A - Arms. Signs are weakness or numbness in an arm. This happens suddenly and usually on one side of the body. S - Speech. Signs are sudden trouble speaking, slurred speech, or trouble understanding what people say. T - Time. Time to call emergency services. Write down what time symptoms started. You have other signs of a stroke, such as: A sudden, severe headache with no known cause. Nausea or vomiting. Seizure. These symptoms may represent a serious problem  that is an emergency. Do not wait to see if the symptoms will go away. Get medical help right away. Call your local emergency services (911 in the [place] Do not drive yourself to the hospital. Summary Cholesterol plaques increase your risk for heart attack and stroke. Work with your health care provider to keep your cholesterol levels in a healthy range. Eat a healthy, balanced diet, get regular exercise, and maintain a healthy weight. Do not use any products that contain nicotine or tobacco. These products include cigarettes, chewing tobacco, and vaping devices, such as e-cigarettes. Get help right away if you have any symptoms of a stroke. This information is not intended to replace advice given to you by your health care provider. Make sure you discuss any questions you have with your health care provider. Document Revised: 03/16/2020 Document Reviewed: 03/06/2020 Elsevier Patient Education  2022 [employer].   Stop by the lab prior to leaving today. I will notify you of your results once received.     Influenza (Flu) Vaccine (Inactivated or Recombinant): What You Need to Know 1. Why get vaccinated? Influenza vaccine can prevent influenza (flu). Flu is a contagious disease that spreads around the [place] every year, usually between October and May. Anyone can get the flu, but it is more  dangerous for some people. Infants and young children, people 43 years and older, pregnant people, and people with certain health conditions or a weakened immune system are at greatest risk of flu complications. Pneumonia, bronchitis, sinus infections, and ear infections are examples of flu-related complications. If you have a medical condition, such as heart disease, cancer, or diabetes, flu can make it worse. Flu can cause fever and chills, sore throat, muscle aches, fatigue, cough, headache, and runny or stuffy nose. Some people may have vomiting and diarrhea, though this is more common in children  than adults. In an average year, thousands of people in the [place] die from flu, and many more are hospitalized. Flu vaccine prevents millions of illnesses and flu-related visits to the doctor each year. 2. Influenza vaccines CDC recommends everyone 6 months and older get vaccinated every flu season. Children 6 months through 83 years of age may need 2 doses during a single flu season. Everyone else needs only 1 dose each flu season. It takes about 2 weeks for protection to develop after vaccination. There are many flu viruses, and they are always changing. Each year a new flu vaccine is made to protect against the influenza viruses believed to be likely to cause disease in the upcoming flu season. Even when the vaccine doesn't exactly match these viruses, it may still provide some protection. Influenza vaccine does not cause flu. Influenza vaccine may be given at the same time as other vaccines. 3. Talk with your health care provider Tell your vaccination provider if the person getting the vaccine: Has had an allergic reaction after a previous dose of influenza vaccine, or has any severe, life-threatening allergies Has ever had Guillain-Barr Syndrome (also called "GBS") In some cases, your health care provider may decide to postpone influenza vaccination until a future visit. Influenza vaccine can be administered at any time during pregnancy. People who are or will be pregnant during influenza season should receive inactivated influenza vaccine. People with minor illnesses, such as a cold, may be vaccinated. People who are moderately or severely ill should usually wait until they recover before getting influenza vaccine. Your health care provider can give you more information. 4. Risks of a vaccine reaction Soreness, redness, and swelling where the shot is given, fever, muscle aches, and headache can happen after influenza vaccination. There may be a very small increased risk of  Guillain-Barr Syndrome (GBS) after inactivated influenza vaccine (the flu shot). Young children who get the flu shot along with pneumococcal vaccine (PCV13) and/or DTaP vaccine at the same time might be slightly more likely to have a seizure caused by fever. Tell your health care provider if a child who is getting flu vaccine has ever had a seizure. People sometimes faint after medical procedures, including vaccination. Tell your provider if you feel dizzy or have vision changes or ringing in the ears. As with any medicine, there is a very remote chance of a vaccine causing a severe allergic reaction, other serious injury, or death. 5. What if there is a serious problem? An allergic reaction could occur after the vaccinated person leaves the clinic. If you see signs of a severe allergic reaction (hives, swelling of the face and throat, difficulty breathing, a fast heartbeat, dizziness, or weakness), call 9-1-1 and get the person to the nearest hospital. For other signs that concern you, call your health care provider. Adverse reactions should be reported to the Vaccine Adverse Event Reporting System (VAERS). Your health care provider will usually file  this report, or you can do it yourself. Visit the VAERS website at www.vaers.SamedayNews.es or call 959-158-5704. VAERS is only for reporting reactions, and VAERS staff members do not give medical advice. 6. The National Vaccine Injury Compensation Program The [employer] Vaccine Injury Compensation Program (VICP) is a federal program that was created to compensate people who may have been injured by certain vaccines. Claims regarding alleged injury or death due to vaccination have a time limit for filing, which may be as short as two years. Visit the VICP website at GoldCloset.com.ee or call 740-616-6261 to learn about the program and about filing a claim. 7. How can I learn more? Ask your health care provider. Call your local or state health  department. Visit the website of the Food and Drug Administration (FDA) for vaccine package inserts and additional information at TraderRating.uy. Contact the Centers for Disease Control and Prevention (CDC): Call 959-459-1871 (1-800-CDC-INFO) or Visit CDC's website at https://gibson.com/. Vaccine Information Statement Inactivated Influenza Vaccine (08/20/2019) This information is not intended to replace advice given to you by your health care provider. Make sure you discuss any questions you have with your health care provider. Document Revised: 10/07/2019 Document Reviewed: 10/07/2019 Elsevier Patient Education  2022 [employer].

## 2020-09-13 NOTE — Assessment & Plan Note (Addendum)
Myalgias resolved upon holding atorvastatin.   We will check a lipid panel today and consider switching to rosuvastatin 5 mg. He agrees.  I evaluated patient, was consulted regarding treatment, and agree with assessment and plan per Budd Palmer, RN, DNP student.   Allie Bossier, NP-C

## 2020-09-13 NOTE — Progress Notes (Signed)
Subjective:    Patient ID: Jacob Ali, male    DOB: [birth year], 60 y.o.   MRN: [id number]  HPI  Jacob Ali is a very pleasant 60 y.o. male with a history of type 2 diabetes, hypertension, atherosclerosis with claudication, HIV who presents today for follow up of arthralgias,myalgias and also to repeat labs.  Previously managed on atorvastatin 20 mg for hyperlipidemia and atherosclerosis. He sent a message via [place] in June with reports of back and lower extremity pain so we decided to hold his atorvastatin for two weeks to see if he noticed improvement.   He is here today (2 months later), has not had his atorvastatin 20 mg for two months. He has noticed nearly complete resolve in his lower back pain and lower extremity pain. He does not wish to return to atorvastatin. He is also requesting repeat CBC and B12. He is taking 2500 mcg of vitamin B12 daily.    Review of Systems  Eyes:  Negative for visual disturbance.  Respiratory:  Negative for shortness of breath.   Cardiovascular:  Negative for chest pain.  Neurological:  Negative for dizziness.        Past Medical History:  Diagnosis Date   Depression    GERD (gastroesophageal reflux disease)    HIV infection (HCC)    Hyperlipidemia    Hypertension    Peripheral vascular disease (HCC)    PONV (postoperative nausea and vomiting)    Sleep apnea    Subdural hemorrhage (HCC)    Tubular adenoma of colon 11/03/2017   Colonoscopy Sept 9, 2019    Social History   Socioeconomic History   Marital status: Single    Spouse name: Not on file   Number of children: Not on file   Years of education: 12   Highest education level: Not on file  Occupational History   Occupation: Recruitment consultant: [employer]  Tobacco Use   Smoking status: Every Day    Packs/day: 0.50    Years: 40.00    Pack years: 20.00    Types: Cigars, Cigarettes   Smokeless tobacco: Never   Tobacco comments:    1/2PPD  Substance  and Sexual Activity   Alcohol use: Yes    Alcohol/week: 3.0 standard drinks    Types: 3 Standard drinks or equivalent per week    Comment: 2-3 times a week   Drug use: Yes    Frequency: 2.0 times per week    Types: Marijuana   Sexual activity: Not Currently    Partners: Male    Birth control/protection: Condom    Comment: pt. given condoms  Other Topics Concern   Not on file  Social History Narrative   Single, lives alone.     Social Determinants of Health   Financial Resource Strain: Not on file  Food Insecurity: Not on file  Transportation Needs: Not on file  Physical Activity: Not on file  Stress: Not on file  Social Connections: Not on file  Intimate Partner Violence: Not on file    Past Surgical History:  Procedure Laterality Date   Examination under anesthesia, repair of anal fissure,  12/04/2000   PERIPHERAL VASCULAR CATHETERIZATION Left 02/06/2016   Procedure: Lower Extremity Angiography;  Surgeon: Katha Cabal, MD;  Location: [facility];  Service: Cardiovascular;  Laterality: Left;   PERIPHERAL VASCULAR CATHETERIZATION N/A 02/06/2016   Procedure: Abdominal Aortogram w/Lower Extremity;  Surgeon: Katha Cabal, MD;  Location: [facility]  INVASIVE [facility];  Service: Cardiovascular;  Laterality: N/A;   PERIPHERAL VASCULAR CATHETERIZATION  02/06/2016   Procedure: Lower Extremity Intervention;  Surgeon: Katha Cabal, MD;  Location: [place] [facility];  Service: Cardiovascular;;    Family History  Problem Relation Age of Onset   Throat cancer Father    Heart disease Father    Cancer Father        bladder   Cancer Maternal Grandmother        colon    Allergies  Allergen Reactions   Tamiflu [Oseltamivir Phosphate] Other (See Comments)    Depressive symptoms    Current Outpatient Medications on File Prior to Visit  Medication Sig Dispense Refill   ACCU-CHEK GUIDE test strip USE UP TO 4 TIMES A DAY AS DIRECTED 100 strip 5   Accu-Chek Softclix  Lancets lancets USE UP TO 4 TIMES A DAY AS DIRECTED 100 each 5   atorvastatin (LIPITOR) 20 MG tablet Take 1 tablet (20 mg total) by mouth every evening. For cholesterol. (Patient taking differently: Take 20 mg by mouth daily.) 90 tablet 3   bictegravir-emtricitabine-tenofovir AF (BIKTARVY) 50-200-25 MG TABS tablet TAKE 1 TABLET BY MOUTH DAILY. 30 tablet 5   clopidogrel (PLAVIX) 75 MG tablet TAKE 1 TABLET BY MOUTH EVERY DAY 90 tablet 2   fluticasone (FLONASE) 50 MCG/ACT nasal spray Place 1 spray into both nostrils daily as needed for allergies or rhinitis.     ibuprofen (ADVIL) 800 MG tablet Take 1 tablet (800 mg total) by mouth daily as needed for moderate pain. 90 tablet 0   lisinopril-hydrochlorothiazide (ZESTORETIC) 20-25 MG tablet Take 1 tablet by mouth daily. 90 tablet 0   pantoprazole (PROTONIX) 40 MG tablet Take 1 tablet (40 mg total) by mouth daily. For heartburn. 90 tablet 3   No current facility-administered medications on file prior to visit.    BP 134/78   Pulse 88   Temp 98.4 F (36.9 C) (Temporal)   Ht '5\' 8"'$  (1.727 m)   Wt 191 lb (86.6 kg)   SpO2 97%   BMI 29.04 kg/m  Objective:   Physical Exam Cardiovascular:     Rate and Rhythm: Normal rate and regular rhythm.  Pulmonary:     Effort: Pulmonary effort is normal.     Breath sounds: Normal breath sounds. No wheezing or rales.  Musculoskeletal:     Cervical back: Neck supple.  Skin:    General: Skin is warm and dry.  Neurological:     Mental Status: He is alert and oriented to person, place, and time.          Assessment & Plan:      This visit occurred during the SARS-CoV-2 public health emergency.  Safety protocols were in place, including screening questions prior to the visit, additional usage of staff PPE, and extensive cleaning of exam room while observing appropriate contact time as indicated for disinfecting solutions.

## 2020-09-13 NOTE — Progress Notes (Signed)
Established Patient Office Visit  Subjective:  Patient ID: Jacob Ali, male    DOB: 1960-06-19  Age: 60 y.o. MRN: [id number]  CC:  Chief Complaint  Patient presents with   Mouth Lesions    Sore on tongue    Hyperlipidemia    Re check labs after stopping medications.     HPI Jacob Ali is a 60 year old male with a history of essential hypertension, atherosclerosis with claudication, Type 2 diabetes, hyperlipidemia presents for hyperlipidemia follow up.His last lipid panel was June 29, 2020. He reported severe muscle pain in his lower back and lower extremities which extended down the length of the legs to the calf. On July 06, 2020 was advised to take a 2 week Atorvastatin holiday to see if the symptoms improved but he never resumed the medication. He reports that his pain has mostly subsided in these areas. He takes all of his medications as prescribed, including Lisinopril-HCTX 20-25 mg tablet daily. BP is controlled today.  This SmartLink has not been configured with any valid records.   BP Readings from Last 3 Encounters:  09/13/20 134/78  06/26/20 (!) 142/82  05/30/20 125/64      Hx:  F/U:   Past Medical History:  Diagnosis Date   Depression    GERD (gastroesophageal reflux disease)    HIV infection ([place])    Hyperlipidemia    Hypertension    Peripheral vascular disease (H[facility])    PONV (postoperative nausea and vomiting)    Sleep apnea    Subdural hemorrhage ([place])    Tubular adenoma of colon 11/03/2017   Colonoscopy Sept 9, 2019    Past Surgical History:  Procedure Laterality Date   Examination under anesthesia, repair of anal fissure,  12/04/2000   PERIPHERAL VASCULAR CATHETERIZATION Left 02/06/2016   Procedure: Lower Extremity Angiography;  Surgeon: Katha Cabal, MD;  Location: [facility];  Service: Cardiovascular;  Laterality: Left;   PERIPHERAL VASCULAR CATHETERIZATION N/A 02/06/2016   Procedure: Abdominal Aortogram w/Lower Extremity;   Surgeon: Katha Cabal, MD;  Location: [facility];  Service: Cardiovascular;  Laterality: N/A;   PERIPHERAL VASCULAR CATHETERIZATION  02/06/2016   Procedure: Lower Extremity Intervention;  Surgeon: Katha Cabal, MD;  Location: [facility];  Service: Cardiovascular;;    Family History  Problem Relation Age of Onset   Throat cancer Father    Heart disease Father    Cancer Father        bladder   Cancer Maternal Grandmother        colon    Social History   Socioeconomic History   Marital status: Single    Spouse name: Not on file   Number of children: Not on file   Years of education: 12   Highest education level: Not on file  Occupational History   Occupation: Recruitment consultant: [employer]  Tobacco Use   Smoking status: Every Day    Packs/day: 0.50    Years: 40.00    Pack years: 20.00    Types: Cigars, Cigarettes   Smokeless tobacco: Never   Tobacco comments:    1/2PPD  Substance and Sexual Activity   Alcohol use: Yes    Alcohol/week: 3.0 standard drinks    Types: 3 Standard drinks or equivalent per week    Comment: 2-3 times a week   Drug use: Yes    Frequency: 2.0 times per week    Types: Marijuana  Sexual activity: Not Currently    Partners: Male    Birth control/protection: Condom    Comment: pt. given condoms  Other Topics Concern   Not on file  Social History Narrative   Single, lives alone.     Social Determinants of Health   Financial Resource Strain: Not on file  Food Insecurity: Not on file  Transportation Needs: Not on file  Physical Activity: Not on file  Stress: Not on file  Social Connections: Not on file  Intimate Partner Violence: Not on file    Outpatient Medications Prior to Visit  Medication Sig Dispense Refill   ACCU-CHEK GUIDE test strip USE UP TO 4 TIMES A DAY AS DIRECTED 100 strip 5   Accu-Chek Softclix Lancets lancets USE UP TO 4 TIMES A DAY AS DIRECTED 100 each 5   atorvastatin  (LIPITOR) 20 MG tablet Take 1 tablet (20 mg total) by mouth every evening. For cholesterol. (Patient taking differently: Take 20 mg by mouth daily.) 90 tablet 3   bictegravir-emtricitabine-tenofovir AF (BIKTARVY) 50-200-25 MG TABS tablet TAKE 1 TABLET BY MOUTH DAILY. 30 tablet 5   clopidogrel (PLAVIX) 75 MG tablet TAKE 1 TABLET BY MOUTH EVERY DAY 90 tablet 2   fluticasone (FLONASE) 50 MCG/ACT nasal spray Place 1 spray into both nostrils daily as needed for allergies or rhinitis.     ibuprofen (ADVIL) 800 MG tablet Take 1 tablet (800 mg total) by mouth daily as needed for moderate pain. 90 tablet 0   lisinopril-hydrochlorothiazide (ZESTORETIC) 20-25 MG tablet Take 1 tablet by mouth daily. 90 tablet 0   pantoprazole (PROTONIX) 40 MG tablet Take 1 tablet (40 mg total) by mouth daily. For heartburn. 90 tablet 3   No facility-administered medications prior to visit.    Allergies  Allergen Reactions   Tamiflu [Oseltamivir Phosphate] Other (See Comments)    Depressive symptoms    ROS Review of Systems  Constitutional: Negative.   HENT:  Positive for mouth sores.        Single mostly resolved tongue ulcer left lateral tongue  Respiratory: Negative.    Cardiovascular: Negative.   Endocrine: Negative.   Neurological: Negative.   Psychiatric/Behavioral: Negative.       Objective:    Physical Exam Constitutional:      Appearance: Normal appearance.  HENT:     Mouth/Throat:     Comments: Single, mostly resolved ulcer left lateral tongue, < 1 cm Cardiovascular:     Rate and Rhythm: Normal rate and regular rhythm.     Heart sounds: Normal heart sounds.  Pulmonary:     Effort: Pulmonary effort is normal.     Breath sounds: Normal breath sounds.  Neurological:     General: No focal deficit present.     Mental Status: He is alert and oriented to person, place, and time. Mental status is at baseline.  Psychiatric:        Mood and Affect: Mood normal.        Behavior: Behavior normal.         Thought Content: Thought content normal.    BP 134/78   Pulse 88   Temp 98.4 F (36.9 C) (Temporal)   Ht 5' 8"  (1.727 m)   Wt 191 lb (86.6 kg)   SpO2 97%   BMI 29.04 kg/m  Wt Readings from Last 3 Encounters:  09/13/20 191 lb (86.6 kg)  06/26/20 179 lb (81.2 kg)  05/30/20 182 lb (82.6 kg)     Health Maintenance Due  Topic Date Due   OPHTHALMOLOGY EXAM  Never done   INFLUENZA VACCINE  08/14/2020    There are no preventive care reminders to display for this patient.  Lab Results  Component Value Date   TSH 1.570 06/26/2020   Lab Results  Component Value Date   WBC 9.5 06/26/2020   HGB 13.0 06/26/2020   HCT 39.2 06/26/2020   MCV 103 (H) 06/26/2020   PLT 200 06/26/2020   Lab Results  Component Value Date   NA 135 06/26/2020   K 4.2 06/26/2020   CO2 24 03/16/2020   GLUCOSE 122 (H) 06/26/2020   BUN 22 06/26/2020   CREATININE 1.01 06/26/2020   BILITOT 0.5 06/26/2020   ALKPHOS 65 06/26/2020   AST 20 06/26/2020   ALT 18 06/26/2020   PROT 7.3 06/26/2020   ALBUMIN 5.1 (H) 06/26/2020   CALCIUM 9.5 06/26/2020   ANIONGAP 10 03/16/2020   EGFR 85 06/26/2020   GFR 75.23 05/19/2019   Lab Results  Component Value Date   CHOL 140 06/26/2020   Lab Results  Component Value Date   HDL 29 (L) 06/26/2020   Lab Results  Component Value Date   LDLCALC 86 06/26/2020   Lab Results  Component Value Date   TRIG 138 06/26/2020   Lab Results  Component Value Date   CHOLHDL 4.8 06/26/2020   Lab Results  Component Value Date   HGBA1C 4.4 (L) 06/26/2020      Assessment & Plan:   Problem List Items Addressed This Visit   None   No orders of the defined types were placed in this encounter.   Follow-up: No follow-ups on file.    Ninfa Meeker, RN

## 2020-09-13 NOTE — Assessment & Plan Note (Signed)
Intolerant to atorvastatin 20 mg. Consider switching to rosuvastatin 5 mg. Lipid panel pending.

## 2020-09-13 NOTE — Assessment & Plan Note (Signed)
MCV and MCH were elevated in June 2022, so we will recheck these labs today. He is on B12 2500 mcg daily.  I evaluated patient, was consulted regarding treatment, and agree with assessment and plan per Budd Palmer, RN, DNP student.   Allie Bossier, NP-C

## 2020-09-14 ENCOUNTER — Other Ambulatory Visit: Payer: Self-pay | Admitting: Primary Care

## 2020-09-14 ENCOUNTER — Other Ambulatory Visit (HOSPITAL_COMMUNITY): Payer: Self-pay

## 2020-09-14 DIAGNOSIS — E785 Hyperlipidemia, unspecified: Secondary | ICD-10-CM

## 2020-09-14 MED ORDER — PRAVASTATIN SODIUM 40 MG PO TABS
40.0000 mg | ORAL_TABLET | Freq: Every day | ORAL | 3 refills | Status: DC
  Filled 2020-09-14: qty 90, 90d supply, fill #0

## 2020-09-15 ENCOUNTER — Other Ambulatory Visit (HOSPITAL_COMMUNITY): Payer: Self-pay

## 2020-09-19 ENCOUNTER — Other Ambulatory Visit: Payer: Self-pay

## 2020-09-19 ENCOUNTER — Other Ambulatory Visit (HOSPITAL_COMMUNITY): Payer: Self-pay

## 2020-09-19 DIAGNOSIS — E785 Hyperlipidemia, unspecified: Secondary | ICD-10-CM

## 2020-09-19 MED ORDER — PRAVASTATIN SODIUM 40 MG PO TABS: 40.0000 mg | ORAL_TABLET | Freq: Every day | ORAL | 3 refills | Status: DC

## 2020-09-20 ENCOUNTER — Other Ambulatory Visit (HOSPITAL_COMMUNITY): Payer: Self-pay

## 2020-09-21 ENCOUNTER — Encounter (INDEPENDENT_AMBULATORY_CARE_PROVIDER_SITE_OTHER): Payer: Self-pay

## 2020-10-11 ENCOUNTER — Other Ambulatory Visit (HOSPITAL_COMMUNITY): Payer: Self-pay

## 2020-10-13 ENCOUNTER — Other Ambulatory Visit (HOSPITAL_COMMUNITY): Payer: Self-pay

## 2020-10-17 ENCOUNTER — Other Ambulatory Visit (HOSPITAL_COMMUNITY): Payer: Self-pay

## 2020-10-17 ENCOUNTER — Other Ambulatory Visit: Payer: Self-pay | Admitting: Primary Care

## 2020-10-17 DIAGNOSIS — E119 Type 2 diabetes mellitus without complications: Secondary | ICD-10-CM

## 2020-10-18 ENCOUNTER — Other Ambulatory Visit (HOSPITAL_COMMUNITY): Payer: Self-pay

## 2020-11-13 ENCOUNTER — Other Ambulatory Visit (HOSPITAL_COMMUNITY): Payer: Self-pay

## 2020-11-14 ENCOUNTER — Other Ambulatory Visit: Payer: No Typology Code available for payment source

## 2020-11-14 ENCOUNTER — Other Ambulatory Visit (HOSPITAL_COMMUNITY)
Admission: RE | Admit: 2020-11-14 | Discharge: 2020-11-14 | Disposition: A | Discharge status: Home / Self Care | Payer: No Typology Code available for payment source | Source: Ambulatory Visit | Attending: Infectious Diseases | Admitting: Infectious Diseases

## 2020-11-14 ENCOUNTER — Other Ambulatory Visit: Payer: Self-pay

## 2020-11-14 DIAGNOSIS — Z79899 Other long term (current) drug therapy: Secondary | ICD-10-CM

## 2020-11-14 DIAGNOSIS — B20 Human immunodeficiency virus [HIV] disease: Secondary | ICD-10-CM | POA: Diagnosis not present

## 2020-11-14 DIAGNOSIS — E785 Hyperlipidemia, unspecified: Secondary | ICD-10-CM

## 2020-11-14 DIAGNOSIS — Z113 Encounter for screening for infections with a predominantly sexual mode of transmission: Secondary | ICD-10-CM

## 2020-11-15 ENCOUNTER — Other Ambulatory Visit (HOSPITAL_COMMUNITY): Payer: Self-pay

## 2020-11-15 LAB — T-HELPER CELL (CD4) - (RCID CLINIC ONLY)
CD4 % Helper T Cell: 46 % (ref 33–65)
CD4 T Cell Abs: 1456 /uL (ref 400–1790)

## 2020-11-15 LAB — URINE CYTOLOGY ANCILLARY ONLY
Chlamydia: NEGATIVE
Comment: NEGATIVE
Comment: NORMAL
Neisseria Gonorrhea: NEGATIVE

## 2020-11-16 ENCOUNTER — Telehealth: Payer: Self-pay | Admitting: Primary Care

## 2020-11-16 ENCOUNTER — Other Ambulatory Visit: Payer: No Typology Code available for payment source

## 2020-11-16 DIAGNOSIS — E785 Hyperlipidemia, unspecified: Secondary | ICD-10-CM

## 2020-11-16 DIAGNOSIS — E119 Type 2 diabetes mellitus without complications: Secondary | ICD-10-CM

## 2020-11-16 NOTE — Telephone Encounter (Signed)
Please notify patient that his cholesterol looks like it could be better but also triglycerides are worse, is he taking the pravastatin cholesterol medication?  I recommend adding fish oil 1000 mg twice daily.  I see that his glucose was slightly high, better than last time.  Historically his A1c (diabetes test) have all been negative.  Not sure if he was fasting for this test...  I do not need any further labs now, okay to cancel lab appointment, but recommend repeating cholesterol again in 2 months.Jacob Ali

## 2020-11-16 NOTE — Telephone Encounter (Signed)
Called patient let know that the only thing that was done that you ordered was the Lipid panel. He will keep appointment I have moved to [place] office for him. He did say he needed A1c done as well? And I think your lipid panel needs to be canceled so it is not done Right?

## 2020-11-16 NOTE — Telephone Encounter (Signed)
Pt has lab appt scheduled for 11/7 and he had labs done by Dr. Johnnye Sima from [facility] and pt want provider to look at those labs because glucose was done.

## 2020-11-17 LAB — LIPID PANEL
Cholesterol: 162 mg/dL (ref <200)
HDL: 25 mg/dL — ABNORMAL LOW (ref >=40)
Non-HDL Cholesterol (Calc): 137 mg/dL (calc) — ABNORMAL HIGH (ref <130)
Total CHOL/HDL Ratio: 6.5 (calc) — ABNORMAL HIGH (ref <5.0)
Triglycerides: 571 mg/dL — ABNORMAL HIGH (ref <150)

## 2020-11-17 LAB — RPR: RPR Ser Ql: NONREACTIVE

## 2020-11-17 LAB — HIV-1 RNA QUANT-NO REFLEX-BLD
HIV 1 RNA Quant: NOT DETECTED Copies/mL
HIV-1 RNA Quant, Log: NOT DETECTED Log cps/mL

## 2020-11-17 LAB — CBC
HCT: 38.7 % (ref 38.5–50.0)
Hemoglobin: 13.3 g/dL (ref 13.2–17.1)
MCH: 33.8 pg — ABNORMAL HIGH (ref 27.0–33.0)
MCHC: 34.4 g/dL (ref 32.0–36.0)
MCV: 98.5 fL (ref 80.0–100.0)
MPV: 10.6 fL (ref 7.5–12.5)
Platelets: 240 10*3/uL (ref 140–400)
RBC: 3.93 10*6/uL — ABNORMAL LOW (ref 4.20–5.80)
RDW: 11.9 % (ref 11.0–15.0)
WBC: 11.3 10*3/uL — ABNORMAL HIGH (ref 3.8–10.8)

## 2020-11-17 LAB — COMPREHENSIVE METABOLIC PANEL
AG Ratio: 2.1 (calc) (ref 1.0–2.5)
ALT: 16 U/L (ref 9–46)
AST: 17 U/L (ref 10–35)
Albumin: 4.6 g/dL (ref 3.6–5.1)
Alkaline phosphatase (APISO): 51 U/L (ref 35–144)
BUN: 16 mg/dL (ref 7–25)
CO2: 24 mmol/L (ref 20–32)
Calcium: 9.5 mg/dL (ref 8.6–10.3)
Chloride: 108 mmol/L (ref 98–110)
Creat: 0.97 mg/dL (ref 0.70–1.35)
Globulin: 2.2 g/dL (calc) (ref 1.9–3.7)
Glucose, Bld: 110 mg/dL — ABNORMAL HIGH (ref 65–99)
Potassium: 4 mmol/L (ref 3.5–5.3)
Sodium: 139 mmol/L (ref 135–146)
Total Bilirubin: 0.5 mg/dL (ref 0.2–1.2)
Total Protein: 6.8 g/dL (ref 6.1–8.1)

## 2020-11-17 NOTE — Telephone Encounter (Signed)
Noted, orders for both A1C and lipids placed.

## 2020-11-17 NOTE — Telephone Encounter (Signed)
Called patient reviewed all information and repeated back to me. Will call if any questions.  Patient will start on fish oil and continue to work on diet and exercise.   He request that we do A1c when labs are done at next lab visit. Ok to add future for A1C with the lipid?

## 2020-11-20 ENCOUNTER — Other Ambulatory Visit: Payer: No Typology Code available for payment source

## 2020-11-24 ENCOUNTER — Telehealth: Payer: Self-pay

## 2020-11-24 ENCOUNTER — Other Ambulatory Visit (HOSPITAL_COMMUNITY): Payer: Self-pay

## 2020-11-24 NOTE — Telephone Encounter (Signed)
[facility] Patient Advocate Encounter   I was successful in securing patient a $7500.00 grant from [facility] [place] (PAF) to provide copayment coverage for Biktarvy.  This will make the out of pocket cost $0.00.     I have spoken with the patient.    The billing information is as follows and has been shared with [place].           Dates of Eligibility: 11/24/20 through 11/24/21  Patient knows to call the office with questions or concerns.  [name], [facility] Patient [facility] Phone: [phone] Fax:  [phone]

## 2020-11-28 ENCOUNTER — Encounter: Payer: No Typology Code available for payment source | Admitting: Infectious Diseases

## 2020-12-04 DIAGNOSIS — I10 Essential (primary) hypertension: Secondary | ICD-10-CM

## 2020-12-04 DIAGNOSIS — M1991 Primary osteoarthritis, unspecified site: Secondary | ICD-10-CM

## 2020-12-05 ENCOUNTER — Encounter: Payer: PRIVATE HEALTH INSURANCE | Admitting: Infectious Diseases

## 2020-12-05 MED ORDER — IBUPROFEN 800 MG PO TABS: 800.0000 mg | ORAL_TABLET | Freq: Every day | ORAL | 0 refills | Status: DC | PRN

## 2020-12-05 MED ORDER — LISINOPRIL-HYDROCHLOROTHIAZIDE 20-25 MG PO TABS: 1.0000 | ORAL_TABLET | Freq: Every day | ORAL | 1 refills | Status: DC

## 2020-12-20 ENCOUNTER — Other Ambulatory Visit (HOSPITAL_COMMUNITY): Payer: Self-pay

## 2020-12-21 ENCOUNTER — Other Ambulatory Visit (HOSPITAL_COMMUNITY): Payer: Self-pay

## 2021-01-17 ENCOUNTER — Other Ambulatory Visit: Payer: No Typology Code available for payment source

## 2021-01-18 ENCOUNTER — Other Ambulatory Visit: Payer: Self-pay | Admitting: Infectious Diseases

## 2021-01-18 ENCOUNTER — Other Ambulatory Visit (HOSPITAL_COMMUNITY): Payer: Self-pay

## 2021-01-18 DIAGNOSIS — B20 Human immunodeficiency virus [HIV] disease: Secondary | ICD-10-CM

## 2021-01-18 MED ORDER — BIKTARVY 50-200-25 MG PO TABS
1.0000 | ORAL_TABLET | Freq: Every day | ORAL | 0 refills | Status: DC
  Filled 2021-01-18: qty 30, 30d supply, fill #0

## 2021-01-22 ENCOUNTER — Other Ambulatory Visit (HOSPITAL_COMMUNITY): Payer: Self-pay

## 2021-01-24 ENCOUNTER — Other Ambulatory Visit (INDEPENDENT_AMBULATORY_CARE_PROVIDER_SITE_OTHER): Payer: No Typology Code available for payment source

## 2021-01-24 ENCOUNTER — Other Ambulatory Visit: Payer: Self-pay

## 2021-01-24 DIAGNOSIS — E119 Type 2 diabetes mellitus without complications: Secondary | ICD-10-CM

## 2021-01-24 DIAGNOSIS — E785 Hyperlipidemia, unspecified: Secondary | ICD-10-CM

## 2021-01-24 DIAGNOSIS — Z125 Encounter for screening for malignant neoplasm of prostate: Secondary | ICD-10-CM

## 2021-01-24 LAB — HEPATIC FUNCTION PANEL
ALT: 19 U/L (ref 0–53)
AST: 15 U/L (ref 0–37)
Albumin: 4.6 g/dL (ref 3.5–5.2)
Alkaline Phosphatase: 55 U/L (ref 39–117)
Bilirubin, Direct: 0.1 mg/dL (ref 0.0–0.3)
Total Bilirubin: 0.5 mg/dL (ref 0.2–1.2)
Total Protein: 6.8 g/dL (ref 6.0–8.3)

## 2021-01-24 LAB — LDL CHOLESTEROL, DIRECT: Direct LDL: 98 mg/dL

## 2021-01-24 LAB — PSA: PSA: 0.88 ng/mL (ref 0.10–4.00)

## 2021-01-24 LAB — LIPID PANEL
Cholesterol: 169 mg/dL (ref 0–200)
HDL: 29 mg/dL — ABNORMAL LOW (ref >39.00)
NonHDL: 140.18
Total CHOL/HDL Ratio: 6
Triglycerides: 288 mg/dL — ABNORMAL HIGH (ref 0.0–149.0)
VLDL: 57.6 mg/dL — ABNORMAL HIGH (ref 0.0–40.0)

## 2021-01-24 LAB — HEMOGLOBIN A1C: Hgb A1c MFr Bld: 5.7 % (ref 4.6–6.5)

## 2021-01-25 ENCOUNTER — Ambulatory Visit: Payer: No Typology Code available for payment source | Admitting: Infectious Diseases

## 2021-01-25 ENCOUNTER — Other Ambulatory Visit: Payer: Self-pay

## 2021-01-25 ENCOUNTER — Encounter: Payer: Self-pay | Admitting: Infectious Diseases

## 2021-01-25 VITALS — BP 158/82 | HR 92 | Temp 98.1°F | Wt 190.0 lb

## 2021-01-25 DIAGNOSIS — I10 Essential (primary) hypertension: Secondary | ICD-10-CM

## 2021-01-25 DIAGNOSIS — I70219 Atherosclerosis of native arteries of extremities with intermittent claudication, unspecified extremity: Secondary | ICD-10-CM

## 2021-01-25 DIAGNOSIS — B20 Human immunodeficiency virus [HIV] disease: Secondary | ICD-10-CM

## 2021-01-25 DIAGNOSIS — Z113 Encounter for screening for infections with a predominantly sexual mode of transmission: Secondary | ICD-10-CM | POA: Diagnosis not present

## 2021-01-25 DIAGNOSIS — E119 Type 2 diabetes mellitus without complications: Secondary | ICD-10-CM | POA: Diagnosis not present

## 2021-01-25 DIAGNOSIS — E785 Hyperlipidemia, unspecified: Secondary | ICD-10-CM

## 2021-01-25 DIAGNOSIS — F17200 Nicotine dependence, unspecified, uncomplicated: Secondary | ICD-10-CM

## 2021-01-25 DIAGNOSIS — Z21 Asymptomatic human immunodeficiency virus [HIV] infection status: Secondary | ICD-10-CM

## 2021-01-25 DIAGNOSIS — Z79899 Other long term (current) drug therapy: Secondary | ICD-10-CM

## 2021-01-25 DIAGNOSIS — G4700 Insomnia, unspecified: Secondary | ICD-10-CM

## 2021-01-25 MED ORDER — TRAZODONE HCL 50 MG PO TABS: 50.0000 mg | ORAL_TABLET | Freq: Every evening | ORAL | 1 refills | Status: DC | PRN

## 2021-01-25 NOTE — Progress Notes (Signed)
Subjective:    Patient ID: Jacob Ali, male  DOB: 07-Jul-1960, 61 y.o.        MRN: [id number]   HPI 61 y.o. M HIV+ September 2012.  Hx of HTN, hyperlipidemia, hospitalization for tylenol overdose and cutting of wrists on 02/20/11.  He underwent atherectomy (01-2016) L SFA, PTCA and stent of L SFA and popliteal arteries; and PTCA and stent of R common iliac.  Duplex ultrasound of the left leg shows moderat restenosis of the stent in the SFA >50%. He states his f/u since have been good.  Review of his ABI 09-2019 show slight decrease on L from prior (03-2019).  Prev taking tivicay-descovy. Changed to biktarvy 11-2016. Alexa tells him to take his pills every morning at 7:30am.  Was seen by his PCP 05-2019, on pravastatin for hyperlipidemia (lipitor gave him myalgias).   No problems with ART.   His A1C is 5.7 (01-14-21), eats what he wants.   1/2 ppd of cigarillos  Mother is out of SNF then aspirated and cracked out a vertebrae. She had multiple ED and PCP visits after not being able to get out bed.  She has another cracked vertebrae and has gone through PT and pain has improved. She has since fallen and broken 2 more vertebrae. Now back at [facility] Potential for ALF placement.  Mentally sharp.    HIV 1 RNA Quant  Date Value  11/14/2020 Not Detected Copies/mL  11/03/2019 29 Copies/mL (H)  11/17/2018 <20 NOT DETECTED copies/mL   CD4 (no units)  Date Value  11/24/2015 1,130  05/17/2015 1,550  09/30/2014 952   CD4 T Cell Abs (/uL)  Date Value  11/14/2020 1,456  11/03/2019 1,530  11/18/2017 1,300     Health Maintenance  Topic Date Due   OPHTHALMOLOGY EXAM  Never done   FOOT EXAM  05/30/2021   HEMOGLOBIN A1C  07/24/2021   Pneumococcal Vaccine 81-71 Years old (4 - PPSV23 if available, else PCV20) 12/01/2023   COLONOSCOPY (Pts 45-14yrs Insurance coverage will need to be confirmed)  09/23/2027   TETANUS/TDAP  03/17/2030   INFLUENZA VACCINE  Completed   COVID-19 Vaccine   Completed   Hepatitis C Screening  Completed   HIV Screening  Completed   Zoster Vaccines- Shingrix  Completed   HPV VACCINES  Aged Out      Review of Systems  Constitutional:  Negative for chills, fever and weight loss.  Respiratory:  Positive for cough. Negative for shortness of breath.   Gastrointestinal:  Positive for constipation. Negative for diarrhea.  Genitourinary:  Negative for dysuria.  Psychiatric/Behavioral:  The patient is nervous/anxious and has insomnia (takes tylenol pm).    Please see HPI. All other systems reviewed and negative.     Objective:  Physical Exam Vitals reviewed.  Constitutional:      Appearance: Normal appearance. He is obese.  HENT:     Mouth/Throat:     Mouth: Mucous membranes are moist.  Eyes:     Extraocular Movements: Extraocular movements intact.     Pupils: Pupils are equal, round, and reactive to light.  Cardiovascular:     Rate and Rhythm: Normal rate and regular rhythm.  Pulmonary:     Effort: Pulmonary effort is normal.     Breath sounds: Normal breath sounds.  Abdominal:     General: Bowel sounds are normal. There is no distension.     Palpations: Abdomen is soft.     Tenderness: There is no abdominal tenderness.  Musculoskeletal:  Cervical back: Normal range of motion and neck supple.     Right lower leg: No edema.     Left lower leg: No edema.  Neurological:     Mental Status: He is alert.          Assessment & Plan:

## 2021-01-25 NOTE — Assessment & Plan Note (Signed)
Will try trazadone Encouraged to stop benadryl products due to constipation, dryness.

## 2021-01-25 NOTE — Assessment & Plan Note (Signed)
C/o hip pain. Will set him up for AVN for MRI. He is otherwise doing well vax are up to date. Will see back in 9 months with labs prior.

## 2021-01-25 NOTE — Assessment & Plan Note (Signed)
Doing well on pravastatin.  lfts normal.  Lipids good.

## 2021-01-25 NOTE — Assessment & Plan Note (Signed)
Has been controlled, worse on mondays (after being inactive all weekend). Worse with mowing grass, exertion.

## 2021-01-25 NOTE — Assessment & Plan Note (Signed)
Encouraged to quit. 

## 2021-01-25 NOTE — Assessment & Plan Note (Signed)
Appears to be doing well, diet controlled at this point.  Appreciate PCP f/u.

## 2021-01-25 NOTE — Assessment & Plan Note (Signed)
Will watch his diet, exercise Will f/u with pcp.

## 2021-01-30 ENCOUNTER — Encounter (HOSPITAL_BASED_OUTPATIENT_CLINIC_OR_DEPARTMENT_OTHER): Payer: Self-pay | Admitting: Emergency Medicine

## 2021-01-30 ENCOUNTER — Other Ambulatory Visit: Payer: Self-pay

## 2021-01-30 ENCOUNTER — Inpatient Hospital Stay (HOSPITAL_BASED_OUTPATIENT_CLINIC_OR_DEPARTMENT_OTHER)
Admission: EM | Admit: 2021-01-30 | Discharge: 2021-01-31 | DRG: 247 | Disposition: A | Discharge status: Home / Self Care | Payer: No Typology Code available for payment source | Attending: Cardiology | Admitting: Cardiology

## 2021-01-30 ENCOUNTER — Emergency Department (HOSPITAL_BASED_OUTPATIENT_CLINIC_OR_DEPARTMENT_OTHER): Payer: No Typology Code available for payment source | Admitting: Radiology

## 2021-01-30 ENCOUNTER — Emergency Department (HOSPITAL_BASED_OUTPATIENT_CLINIC_OR_DEPARTMENT_OTHER): Payer: No Typology Code available for payment source

## 2021-01-30 ENCOUNTER — Telehealth (HOSPITAL_COMMUNITY): Payer: Self-pay | Admitting: *Deleted

## 2021-01-30 ENCOUNTER — Encounter (HOSPITAL_COMMUNITY)
Admission: EM | Disposition: A | Discharge status: Home / Self Care | Payer: Self-pay | Source: Home / Self Care | Attending: Cardiology

## 2021-01-30 DIAGNOSIS — Z85038 Personal history of other malignant neoplasm of large intestine: Secondary | ICD-10-CM | POA: Diagnosis not present

## 2021-01-30 DIAGNOSIS — I2510 Atherosclerotic heart disease of native coronary artery without angina pectoris: Secondary | ICD-10-CM | POA: Diagnosis present

## 2021-01-30 DIAGNOSIS — K8020 Calculus of gallbladder without cholecystitis without obstruction: Secondary | ICD-10-CM | POA: Diagnosis present

## 2021-01-30 DIAGNOSIS — E782 Mixed hyperlipidemia: Secondary | ICD-10-CM | POA: Diagnosis not present

## 2021-01-30 DIAGNOSIS — F32A Depression, unspecified: Secondary | ICD-10-CM | POA: Diagnosis present

## 2021-01-30 DIAGNOSIS — Z21 Asymptomatic human immunodeficiency virus [HIV] infection status: Secondary | ICD-10-CM | POA: Diagnosis present

## 2021-01-30 DIAGNOSIS — F17210 Nicotine dependence, cigarettes, uncomplicated: Secondary | ICD-10-CM | POA: Diagnosis present

## 2021-01-30 DIAGNOSIS — Z808 Family history of malignant neoplasm of other organs or systems: Secondary | ICD-10-CM | POA: Diagnosis not present

## 2021-01-30 DIAGNOSIS — F17200 Nicotine dependence, unspecified, uncomplicated: Secondary | ICD-10-CM | POA: Diagnosis present

## 2021-01-30 DIAGNOSIS — K219 Gastro-esophageal reflux disease without esophagitis: Secondary | ICD-10-CM | POA: Diagnosis present

## 2021-01-30 DIAGNOSIS — E1151 Type 2 diabetes mellitus with diabetic peripheral angiopathy without gangrene: Secondary | ICD-10-CM | POA: Diagnosis present

## 2021-01-30 DIAGNOSIS — E785 Hyperlipidemia, unspecified: Secondary | ICD-10-CM | POA: Diagnosis present

## 2021-01-30 DIAGNOSIS — Z8249 Family history of ischemic heart disease and other diseases of the circulatory system: Secondary | ICD-10-CM

## 2021-01-30 DIAGNOSIS — B20 Human immunodeficiency virus [HIV] disease: Secondary | ICD-10-CM | POA: Diagnosis present

## 2021-01-30 DIAGNOSIS — G4733 Obstructive sleep apnea (adult) (pediatric): Secondary | ICD-10-CM | POA: Diagnosis present

## 2021-01-30 DIAGNOSIS — Z955 Presence of coronary angioplasty implant and graft: Secondary | ICD-10-CM

## 2021-01-30 DIAGNOSIS — S065XAA Traumatic subdural hemorrhage with loss of consciousness status unknown, initial encounter: Secondary | ICD-10-CM | POA: Diagnosis present

## 2021-01-30 DIAGNOSIS — Z888 Allergy status to other drugs, medicaments and biological substances status: Secondary | ICD-10-CM | POA: Diagnosis not present

## 2021-01-30 DIAGNOSIS — I200 Unstable angina: Secondary | ICD-10-CM | POA: Diagnosis present

## 2021-01-30 DIAGNOSIS — Z7902 Long term (current) use of antithrombotics/antiplatelets: Secondary | ICD-10-CM | POA: Diagnosis not present

## 2021-01-30 DIAGNOSIS — R079 Chest pain, unspecified: Principal | ICD-10-CM

## 2021-01-30 DIAGNOSIS — Z95820 Peripheral vascular angioplasty status with implants and grafts: Secondary | ICD-10-CM

## 2021-01-30 DIAGNOSIS — Z79899 Other long term (current) drug therapy: Secondary | ICD-10-CM

## 2021-01-30 DIAGNOSIS — I25110 Atherosclerotic heart disease of native coronary artery with unstable angina pectoris: Principal | ICD-10-CM | POA: Diagnosis present

## 2021-01-30 DIAGNOSIS — I10 Essential (primary) hypertension: Secondary | ICD-10-CM | POA: Diagnosis present

## 2021-01-30 DIAGNOSIS — E119 Type 2 diabetes mellitus without complications: Secondary | ICD-10-CM

## 2021-01-30 DIAGNOSIS — I70219 Atherosclerosis of native arteries of extremities with intermittent claudication, unspecified extremity: Secondary | ICD-10-CM | POA: Diagnosis present

## 2021-01-30 HISTORY — PX: CORONARY STENT INTERVENTION: CATH118234

## 2021-01-30 HISTORY — PX: LEFT HEART CATH AND CORONARY ANGIOGRAPHY: CATH118249

## 2021-01-30 LAB — CBC
HCT: 42.2 % (ref 39.0–52.0)
HCT: 39.3 % (ref 39.0–52.0)
Hemoglobin: 14.3 g/dL (ref 13.0–17.0)
Hemoglobin: 12.9 g/dL — ABNORMAL LOW (ref 13.0–17.0)
MCH: 33.6 pg (ref 26.0–34.0)
MCH: 33.1 pg (ref 26.0–34.0)
MCHC: 33.9 g/dL (ref 30.0–36.0)
MCHC: 32.8 g/dL (ref 30.0–36.0)
MCV: 99.3 fL (ref 80.0–100.0)
MCV: 100.8 fL — ABNORMAL HIGH (ref 80.0–100.0)
Platelets: 262 10*3/uL (ref 150–400)
Platelets: 229 10*3/uL (ref 150–400)
RBC: 4.25 MIL/uL (ref 4.22–5.81)
RBC: 3.9 MIL/uL — ABNORMAL LOW (ref 4.22–5.81)
RDW: 12.7 % (ref 11.5–15.5)
RDW: 12.8 % (ref 11.5–15.5)
WBC: 9.1 10*3/uL (ref 4.0–10.5)
WBC: 7.8 10*3/uL (ref 4.0–10.5)
nRBC: 0 % (ref 0.0–0.2)
nRBC: 0 % (ref 0.0–0.2)

## 2021-01-30 LAB — CREATININE, SERUM
Creatinine, Ser: 0.86 mg/dL (ref 0.61–1.24)
Creatinine, Ser: 0.97 mg/dL (ref 0.61–1.24)
GFR, Estimated: >60 mL/min (ref >60)
GFR, Estimated: >60 mL/min (ref >60)

## 2021-01-30 LAB — POCT ACTIVATED CLOTTING TIME
Activated Clotting Time: 335 seconds
Activated Clotting Time: 360 seconds

## 2021-01-30 LAB — CBC WITH DIFFERENTIAL/PLATELET
Abs Immature Granulocytes: 0.04 10*3/uL (ref 0.00–0.07)
Basophils Absolute: 0.1 10*3/uL (ref 0.0–0.1)
Basophils Relative: 1 %
Eosinophils Absolute: 0.2 10*3/uL (ref 0.0–0.5)
Eosinophils Relative: 2 %
HCT: 42 % (ref 39.0–52.0)
Hemoglobin: 14.3 g/dL (ref 13.0–17.0)
Immature Granulocytes: 0 %
Lymphocytes Relative: 32 %
Lymphs Abs: 3 10*3/uL (ref 0.7–4.0)
MCH: 33.6 pg (ref 26.0–34.0)
MCHC: 34 g/dL (ref 30.0–36.0)
MCV: 98.6 fL (ref 80.0–100.0)
Monocytes Absolute: 0.9 10*3/uL (ref 0.1–1.0)
Monocytes Relative: 10 %
Neutro Abs: 5 10*3/uL (ref 1.7–7.7)
Neutrophils Relative %: 55 %
Platelets: 260 10*3/uL (ref 150–400)
RBC: 4.26 MIL/uL (ref 4.22–5.81)
RDW: 12.8 % (ref 11.5–15.5)
WBC: 9.2 10*3/uL (ref 4.0–10.5)
nRBC: 0 % (ref 0.0–0.2)

## 2021-01-30 LAB — HEPATIC FUNCTION PANEL
ALT: 16 U/L (ref 0–44)
AST: 14 U/L — ABNORMAL LOW (ref 15–41)
Albumin: 4.5 g/dL (ref 3.5–5.0)
Alkaline Phosphatase: 48 U/L (ref 38–126)
Bilirubin, Direct: 0.1 mg/dL (ref 0.0–0.2)
Indirect Bilirubin: 0.3 mg/dL (ref 0.3–0.9)
Total Bilirubin: 0.4 mg/dL (ref 0.3–1.2)
Total Protein: 7 g/dL (ref 6.5–8.1)

## 2021-01-30 LAB — BASIC METABOLIC PANEL
Anion gap: 10 (ref 5–15)
BUN: 22 mg/dL (ref 8–23)
CO2: 21 mmol/L — ABNORMAL LOW (ref 22–32)
Calcium: 9.5 mg/dL (ref 8.9–10.3)
Chloride: 104 mmol/L (ref 98–111)
Creatinine, Ser: 0.93 mg/dL (ref 0.61–1.24)
GFR, Estimated: >60 mL/min (ref >60)
Glucose, Bld: 225 mg/dL — ABNORMAL HIGH (ref 70–99)
Potassium: 4.1 mmol/L (ref 3.5–5.1)
Sodium: 135 mmol/L (ref 135–145)

## 2021-01-30 LAB — TROPONIN I (HIGH SENSITIVITY)
Troponin I (High Sensitivity): 34 ng/L — ABNORMAL HIGH (ref <18)
Troponin I (High Sensitivity): 30 ng/L — ABNORMAL HIGH (ref <18)

## 2021-01-30 LAB — LIPASE, BLOOD: Lipase: 23 U/L (ref 11–51)

## 2021-01-30 SURGERY — LEFT HEART CATH AND CORONARY ANGIOGRAPHY
Anesthesia: LOCAL

## 2021-01-30 MED ORDER — OXYCODONE HCL 5 MG PO TABS: 5.0000 mg | ORAL_TABLET | ORAL | Status: DC | PRN

## 2021-01-30 MED ORDER — SODIUM CHLORIDE 0.9 % IV SOLN: INTRAVENOUS | Status: AC

## 2021-01-30 MED ORDER — PANTOPRAZOLE SODIUM 40 MG PO TBEC
40.0000 mg | DELAYED_RELEASE_TABLET | Freq: Every day | ORAL | Status: DC
  Administered 2021-01-31: 40 mg via ORAL
  Filled 2021-01-30: qty 1

## 2021-01-30 MED ORDER — TRAZODONE HCL 50 MG PO TABS: 50.0000 mg | ORAL_TABLET | Freq: Every evening | ORAL | Status: DC | PRN

## 2021-01-30 MED ORDER — CARVEDILOL 3.125 MG PO TABS
3.1250 mg | ORAL_TABLET | Freq: Two times a day (BID) | ORAL | Status: DC
  Administered 2021-01-30 – 2021-01-31 (×2): 3.125 mg via ORAL
  Filled 2021-01-30 (×2): qty 1

## 2021-01-30 MED ORDER — SODIUM CHLORIDE 0.9 % WEIGHT BASED INFUSION: 1.0000 mL/kg/h | INTRAVENOUS | Status: DC

## 2021-01-30 MED ORDER — IOHEXOL 350 MG/ML SOLN
75.0000 mL | Freq: Once | INTRAVENOUS | Status: AC | PRN
  Administered 2021-01-30: 75 mL via INTRAVENOUS

## 2021-01-30 MED ORDER — VERAPAMIL HCL 2.5 MG/ML IV SOLN
INTRAVENOUS | Status: DC | PRN
  Administered 2021-01-30: 10 mL via INTRA_ARTERIAL

## 2021-01-30 MED ORDER — NITROGLYCERIN 0.4 MG SL SUBL: 0.4000 mg | SUBLINGUAL_TABLET | SUBLINGUAL | Status: DC | PRN

## 2021-01-30 MED ORDER — SODIUM CHLORIDE 0.9 % IV SOLN: 250.0000 mL | INTRAVENOUS | Status: DC | PRN

## 2021-01-30 MED ORDER — ASPIRIN 325 MG PO TABS
325.0000 mg | ORAL_TABLET | Freq: Every day | ORAL | Status: DC
  Administered 2021-01-30: 325 mg via ORAL
  Filled 2021-01-30: qty 1

## 2021-01-30 MED ORDER — CLOPIDOGREL BISULFATE 75 MG PO TABS
75.0000 mg | ORAL_TABLET | Freq: Every day | ORAL | Status: DC
Start: 2021-01-31 — End: 2021-01-31
  Administered 2021-01-31: 75 mg via ORAL
  Filled 2021-01-30: qty 1

## 2021-01-30 MED ORDER — HEPARIN SODIUM (PORCINE) 1000 UNIT/ML IJ SOLN
INTRAMUSCULAR | Status: AC
  Filled 2021-01-30: qty 10

## 2021-01-30 MED ORDER — PRAVASTATIN SODIUM 40 MG PO TABS: 40.0000 mg | ORAL_TABLET | Freq: Every day | ORAL | Status: DC

## 2021-01-30 MED ORDER — ASPIRIN 81 MG PO CHEW: 81.0000 mg | CHEWABLE_TABLET | ORAL | Status: DC

## 2021-01-30 MED ORDER — HEPARIN SODIUM (PORCINE) 5000 UNIT/ML IJ SOLN
5000.0000 [IU] | Freq: Three times a day (TID) | INTRAMUSCULAR | Status: DC
  Administered 2021-01-30: 5000 [IU] via SUBCUTANEOUS
  Filled 2021-01-30: qty 1

## 2021-01-30 MED ORDER — VERAPAMIL HCL 2.5 MG/ML IV SOLN
INTRAVENOUS | Status: AC
  Filled 2021-01-30: qty 2

## 2021-01-30 MED ORDER — HEPARIN SODIUM (PORCINE) 5000 UNIT/ML IJ SOLN
5000.0000 [IU] | Freq: Three times a day (TID) | INTRAMUSCULAR | Status: DC
  Administered 2021-01-30 – 2021-01-31 (×2): 5000 [IU] via SUBCUTANEOUS
  Filled 2021-01-30 (×2): qty 1

## 2021-01-30 MED ORDER — CLOPIDOGREL BISULFATE 75 MG PO TABS: 75.0000 mg | ORAL_TABLET | Freq: Every day | ORAL | Status: DC

## 2021-01-30 MED ORDER — ACETAMINOPHEN 325 MG PO TABS: 650.0000 mg | ORAL_TABLET | ORAL | Status: DC | PRN

## 2021-01-30 MED ORDER — SODIUM CHLORIDE 0.9% FLUSH: 3.0000 mL | INTRAVENOUS | Status: DC | PRN

## 2021-01-30 MED ORDER — TICAGRELOR 90 MG PO TABS: ORAL_TABLET | ORAL | Status: DC | PRN

## 2021-01-30 MED ORDER — CLOPIDOGREL BISULFATE 300 MG PO TABS
ORAL_TABLET | ORAL | Status: AC
  Filled 2021-01-30: qty 2

## 2021-01-30 MED ORDER — ATORVASTATIN CALCIUM 80 MG PO TABS: 80.0000 mg | ORAL_TABLET | Freq: Every day | ORAL | Status: DC

## 2021-01-30 MED ORDER — HYDRALAZINE HCL 20 MG/ML IJ SOLN: 10.0000 mg | INTRAMUSCULAR | Status: AC | PRN

## 2021-01-30 MED ORDER — FENTANYL CITRATE (PF) 100 MCG/2ML IJ SOLN
INTRAMUSCULAR | Status: DC | PRN
  Administered 2021-01-30: 25 ug via INTRAVENOUS
  Administered 2021-01-30: 50 ug via INTRAVENOUS

## 2021-01-30 MED ORDER — SODIUM CHLORIDE 0.9 % IV BOLUS
500.0000 mL | Freq: Once | INTRAVENOUS | Status: AC
  Administered 2021-01-30: 500 mL via INTRAVENOUS

## 2021-01-30 MED ORDER — CLOPIDOGREL BISULFATE 300 MG PO TABS
ORAL_TABLET | ORAL | Status: DC | PRN
  Administered 2021-01-30: 600 mg via ORAL

## 2021-01-30 MED ORDER — ROSUVASTATIN CALCIUM 20 MG PO TABS
40.0000 mg | ORAL_TABLET | Freq: Every day | ORAL | Status: DC
  Administered 2021-01-30 – 2021-01-31 (×2): 40 mg via ORAL
  Filled 2021-01-30 (×2): qty 2

## 2021-01-30 MED ORDER — NITROGLYCERIN 1 MG/10 ML FOR IR/CATH LAB
INTRA_ARTERIAL | Status: DC | PRN
  Administered 2021-01-30 (×2): 200 ug

## 2021-01-30 MED ORDER — ONDANSETRON HCL 4 MG/2ML IJ SOLN: 4.0000 mg | Freq: Four times a day (QID) | INTRAMUSCULAR | Status: DC | PRN

## 2021-01-30 MED ORDER — HEPARIN (PORCINE) IN NACL 1000-0.9 UT/500ML-% IV SOLN
INTRAVENOUS | Status: DC | PRN
  Administered 2021-01-30 (×2): 500 mL

## 2021-01-30 MED ORDER — IOHEXOL 350 MG/ML SOLN
INTRAVENOUS | Status: DC | PRN
  Administered 2021-01-30 (×2): 180 mL

## 2021-01-30 MED ORDER — PANTOPRAZOLE SODIUM 40 MG PO TBEC: 40.0000 mg | DELAYED_RELEASE_TABLET | Freq: Every day | ORAL | Status: DC

## 2021-01-30 MED ORDER — HEPARIN SODIUM (PORCINE) 1000 UNIT/ML IJ SOLN
INTRAMUSCULAR | Status: DC | PRN
  Administered 2021-01-30: 4500 [IU] via INTRAVENOUS
  Administered 2021-01-30: 6000 [IU] via INTRAVENOUS

## 2021-01-30 MED ORDER — ASPIRIN EC 81 MG PO TBEC: 81.0000 mg | DELAYED_RELEASE_TABLET | Freq: Every day | ORAL | Status: DC

## 2021-01-30 MED ORDER — LABETALOL HCL 5 MG/ML IV SOLN: 10.0000 mg | INTRAVENOUS | Status: AC | PRN

## 2021-01-30 MED ORDER — NITROGLYCERIN 1 MG/10 ML FOR IR/CATH LAB
INTRA_ARTERIAL | Status: AC
  Filled 2021-01-30: qty 10

## 2021-01-30 MED ORDER — ACETAMINOPHEN 325 MG PO TABS
650.0000 mg | ORAL_TABLET | ORAL | Status: DC | PRN
  Administered 2021-01-31: 650 mg via ORAL
  Filled 2021-01-30: qty 2

## 2021-01-30 MED ORDER — BICTEGRAVIR-EMTRICITAB-TENOFOV 50-200-25 MG PO TABS
1.0000 | ORAL_TABLET | Freq: Every day | ORAL | Status: DC
  Administered 2021-01-31: 1 via ORAL
  Filled 2021-01-30: qty 1

## 2021-01-30 MED ORDER — FENTANYL CITRATE (PF) 100 MCG/2ML IJ SOLN
INTRAMUSCULAR | Status: AC
  Filled 2021-01-30: qty 2

## 2021-01-30 MED ORDER — LIDOCAINE HCL (PF) 1 % IJ SOLN
INTRAMUSCULAR | Status: AC
  Filled 2021-01-30: qty 30

## 2021-01-30 MED ORDER — LIDOCAINE HCL (PF) 1 % IJ SOLN
INTRAMUSCULAR | Status: DC | PRN
  Administered 2021-01-30: 2 mL

## 2021-01-30 MED ORDER — ASPIRIN 81 MG PO CHEW
81.0000 mg | CHEWABLE_TABLET | Freq: Every day | ORAL | Status: DC
  Administered 2021-01-31: 81 mg via ORAL
  Filled 2021-01-30: qty 1

## 2021-01-30 MED ORDER — MIDAZOLAM HCL 2 MG/2ML IJ SOLN
INTRAMUSCULAR | Status: DC | PRN
  Administered 2021-01-30 (×2): 1 mg via INTRAVENOUS

## 2021-01-30 MED ORDER — SODIUM CHLORIDE 0.9% FLUSH: 3.0000 mL | Freq: Two times a day (BID) | INTRAVENOUS | Status: DC

## 2021-01-30 MED ORDER — HEPARIN (PORCINE) IN NACL 1000-0.9 UT/500ML-% IV SOLN
INTRAVENOUS | Status: AC
  Filled 2021-01-30: qty 1000

## 2021-01-30 MED ORDER — HYDRALAZINE HCL 10 MG PO TABS: 10.0000 mg | ORAL_TABLET | Freq: Once | ORAL | Status: DC

## 2021-01-30 MED ORDER — SODIUM CHLORIDE 0.9% FLUSH
3.0000 mL | Freq: Two times a day (BID) | INTRAVENOUS | Status: DC
  Administered 2021-01-30 – 2021-01-31 (×2): 3 mL via INTRAVENOUS

## 2021-01-30 MED ORDER — MIDAZOLAM HCL 2 MG/2ML IJ SOLN
INTRAMUSCULAR | Status: AC
  Filled 2021-01-30: qty 2

## 2021-01-30 MED ORDER — SODIUM CHLORIDE 0.9 % WEIGHT BASED INFUSION: 3.0000 mL/kg/h | INTRAVENOUS | Status: AC

## 2021-01-30 MED ORDER — NITROGLYCERIN 0.4 MG SL SUBL
0.4000 mg | SUBLINGUAL_TABLET | SUBLINGUAL | Status: DC | PRN
  Administered 2021-01-30: 0.4 mg via SUBLINGUAL
  Filled 2021-01-30: qty 1

## 2021-01-30 SURGICAL SUPPLY — 17 items
BALLN SAPPHIRE 2.5X12 (BALLOONS) ×2
BALLN SAPPHIRE ~~LOC~~ 3.0X10 (BALLOONS) ×1 IMPLANT
BALLOON SAPPHIRE 2.5X12 (BALLOONS) IMPLANT
CATH INFINITI 5 FR JL3.5 (CATHETERS) ×1 IMPLANT
CATH INFINITI JR4 5F (CATHETERS) ×1 IMPLANT
CATH VISTA GUIDE 6FR XBLAD3.5 (CATHETERS) ×1 IMPLANT
DEVICE RAD COMP TR BAND LRG (VASCULAR PRODUCTS) ×1 IMPLANT
GLIDESHEATH SLEND A-KIT 6F 22G (SHEATH) ×1 IMPLANT
GUIDEWIRE INQWIRE 1.5J.035X260 (WIRE) IMPLANT
INQWIRE 1.5J .035X260CM (WIRE) ×2
KIT ENCORE 26 ADVANTAGE (KITS) ×1 IMPLANT
KIT HEART LEFT (KITS) ×2 IMPLANT
PACK CARDIAC CATHETERIZATION (CUSTOM PROCEDURE TRAY) ×2 IMPLANT
STENT ONYX FRONTIER 2.75X15 (Permanent Stent) ×1 IMPLANT
TRANSDUCER W/STOPCOCK (MISCELLANEOUS) ×2 IMPLANT
TUBING CIL FLEX 10 FLL-RA (TUBING) ×2 IMPLANT
WIRE ASAHI PROWATER 180CM (WIRE) ×1 IMPLANT

## 2021-01-30 NOTE — H&P (Addendum)
Please use consult note dated 01/30/21 as H and P.  Ledora Bottcher, PA-C 01/30/2021, 2:47 PM [address] [place], [place] [id number]   Please seen addended consult note that will serve as H and P.  Gwyndolyn Kaufman, MD

## 2021-01-30 NOTE — ED Provider Notes (Signed)
[facility] EMERGENCY DEPT Provider Note   CSN: [id number] Arrival date & time: 01/30/21  0701     History  Chief Complaint  Patient presents with   Back Pain   Chest Pain    Jacob Ali is a 61 y.o. male.  Patient is a 61 year old male with a history of hypertension, hyperlipidemia and HIV with peripheral vascular disease who presents with chest and back pain.  He says a has a squeezing tightness sensation across his chest that radiates to his mid upper back between his shoulder blades.  It comes and goes.  He says it lasts maybe 6 to 10 minutes when it comes.  He has had some intermittent episodes for the last few weeks but over the last couple of days its been more frequent and more constant.  He cannot really say if he has associated shortness of breath.  He does have some nausea with that.  He says it feels like it is an intense pain.  No leg pain or swelling.  No cough or cold symptoms.  No exertional symptoms.  No pleuritic symptoms.  No known history of heart problems.  He does smoke cigarettes daily.      Home Medications Prior to Admission medications   Medication Sig Start Date End Date Taking? Authorizing Provider  bictegravir-emtricitabine-tenofovir AF (BIKTARVY) 50-200-25 MG TABS tablet TAKE 1 TABLET BY MOUTH DAILY. 01/18/21 01/18/22 Yes Campbell Riches, MD  clopidogrel (PLAVIX) 75 MG tablet TAKE 1 TABLET BY MOUTH EVERY DAY 07/03/20  Yes Kris Hartmann, NP  lisinopril-hydrochlorothiazide (ZESTORETIC) 20-25 MG tablet Take 1 tablet by mouth daily. For blood pressure. 12/05/20  Yes Pleas Koch, NP  Omega-3 Fatty Acids (FISH OIL) 1000 MG CAPS Take by mouth.   Yes [provider]  pantoprazole (PROTONIX) 40 MG tablet Take 1 tablet (40 mg total) by mouth daily. For heartburn. 07/06/20  Yes Pleas Koch, NP  pravastatin (PRAVACHOL) 40 MG tablet Take 1 tablet by mouth daily for cholesterol. 09/19/20  Yes Pleas Koch, NP  traZODone  (DESYREL) 50 MG tablet Take 1 tablet (50 mg total) by mouth at bedtime as needed for sleep. 01/25/21  Yes Campbell Riches, MD  ACCU-CHEK GUIDE test strip USE UP TO 4 TIMES A DAY AS DIRECTED Patient not taking: Reported on 01/25/2021 10/18/20   Pleas Koch, NP  Accu-Chek Softclix Lancets lancets USE UP TO 4 TIMES A DAY AS DIRECTED Patient not taking: Reported on 01/25/2021 06/21/19   Pleas Koch, NP  ibuprofen (ADVIL) 800 MG tablet Take 1 tablet (800 mg total) by mouth daily as needed for moderate pain. 12/05/20   Pleas Koch, NP      Allergies    Atorvastatin and Tamiflu [oseltamivir phosphate]    Review of Systems   Review of Systems  Constitutional:  Negative for chills, diaphoresis, fatigue and fever.  HENT:  Negative for congestion, rhinorrhea and sneezing.   Eyes: Negative.   Respiratory:  Negative for cough, chest tightness and shortness of breath.   Cardiovascular:  Positive for chest pain. Negative for leg swelling.  Gastrointestinal:  Positive for nausea. Negative for abdominal pain, blood in stool, diarrhea and vomiting.  Genitourinary:  Negative for difficulty urinating, flank pain, frequency and hematuria.  Musculoskeletal:  Positive for back pain. Negative for arthralgias.  Skin:  Negative for rash.  Neurological:  Negative for dizziness, speech difficulty, weakness, numbness and headaches.   Physical Exam Updated Vital Signs BP 132/77  Pulse 70    Temp 98.7 F (37.1 C)    Resp 11    Ht 5\' 9"  (1.753 m)    Wt 86.2 kg    SpO2 98%    BMI 28.06 kg/m  Physical Exam Constitutional:      Appearance: He is well-developed.  HENT:     Head: Normocephalic and atraumatic.  Eyes:     Pupils: Pupils are equal, round, and reactive to light.  Cardiovascular:     Rate and Rhythm: Normal rate and regular rhythm.     Heart sounds: Normal heart sounds.  Pulmonary:     Effort: Pulmonary effort is normal. No respiratory distress.     Breath sounds: Normal breath  sounds. No wheezing or rales.  Chest:     Chest wall: No tenderness.  Abdominal:     General: Bowel sounds are normal.     Palpations: Abdomen is soft.     Tenderness: There is no abdominal tenderness. There is no guarding or rebound.  Musculoskeletal:        General: Normal range of motion.     Cervical back: Normal range of motion and neck supple.     Comments: No edema or calf tenderness  Lymphadenopathy:     Cervical: No cervical adenopathy.  Skin:    General: Skin is warm and dry.     Findings: No rash.  Neurological:     Mental Status: He is alert and oriented to person, place, and time.    ED Results / Procedures / Treatments   Labs (all labs ordered are listed, but only abnormal results are displayed) Labs Reviewed  BASIC METABOLIC PANEL - Abnormal; Notable for the following components:      Result Value   CO2 21 (*)    Glucose, Bld 225 (*)    All other components within normal limits  HEPATIC FUNCTION PANEL - Abnormal; Notable for the following components:   AST 14 (*)    All other components within normal limits  TROPONIN I (HIGH SENSITIVITY) - Abnormal; Notable for the following components:   Troponin I (High Sensitivity) 34 (*)    All other components within normal limits  TROPONIN I (HIGH SENSITIVITY) - Abnormal; Notable for the following components:   Troponin I (High Sensitivity) 30 (*)    All other components within normal limits  CBC WITH DIFFERENTIAL/PLATELET  LIPASE, BLOOD    EKG EKG Interpretation  Date/Time:  Tuesday January 30 2021 09:20:03 EST Ventricular Rate:  83 PR Interval:  143 QRS Duration: 95 QT Interval:  346 QTC Calculation: 407 R Axis:   14 Text Interpretation: Sinus rhythm Nonspecific T abnormalities, lateral leads Confirmed by Malvin Johns [id number] on 01/30/2021 9:27:08 AM  Radiology DG Chest 2 View  Result Date: 01/30/2021 CLINICAL DATA:  Chest pain EXAM: CHEST - 2 VIEW COMPARISON:  Chest x-ray dated April 07, 2015 FINDINGS:  The heart size and mediastinal contours are within normal limits. Both lungs are clear. The visualized skeletal structures are unremarkable. IMPRESSION: No active cardiopulmonary disease. Electronically Signed   By: Yetta Glassman M.D.   On: 01/30/2021 08:11   CT Angio Chest Aorta W and/or Wo Contrast  Result Date: 01/30/2021 CLINICAL DATA:  Acute aortic syndrome with chest pain radiating to the back. EXAM: CT ANGIOGRAPHY CHEST WITH CONTRAST TECHNIQUE: Multidetector CT imaging of the chest was performed using the standard protocol during bolus administration of intravenous contrast. Multiplanar CT image reconstructions and MIPs were obtained to evaluate the vascular  anatomy. RADIATION DOSE REDUCTION: This exam was performed according to the departmental dose-optimization program which includes automated exposure control, adjustment of the mA and/or kV according to patient size and/or use of iterative reconstruction technique. CONTRAST:  67mL OMNIPAQUE IOHEXOL 350 MG/ML SOLN COMPARISON:  None. FINDINGS: Cardiovascular: 2 vessel aortic arch. The right brachiocephalic and left common carotid artery share a common origin. Mild atherosclerotic plaque in the aortic arch and proximal arch vessels. No evidence of aneurysm or dissection. The main pulmonary artery is normal in size. No evidence of pulmonary embolism. Scattered calcifications visualized along the left anterior descending and circumflex coronary arteries. The heart is normal in size. No pericardial effusion. Mediastinum/Nodes: Unremarkable CT appearance of the thyroid gland. No suspicious mediastinal or hilar adenopathy. No soft tissue mediastinal mass. The thoracic esophagus is unremarkable. Lungs/Pleura: Lungs are clear. No pleural effusion or pneumothorax. Upper Abdomen: No acute abnormality. Numerous calcified stones visualized within the gallbladder lumen. Musculoskeletal: No chest wall abnormality. No acute or significant osseous findings. Review of  the MIP images confirms the above findings. IMPRESSION: 1. Negative for aortic dissection, aneurysm or other acute vascular abnormality. 2. Atherosclerotic calcifications are visualized along the aorta and coronary arteries. 3. Cholelithiasis noted incidentally. Aortic Atherosclerosis (ICD10-I70.0). Electronically Signed   By: Jacqulynn Cadet M.D.   On: 01/30/2021 09:39    Procedures Procedures    Medications Ordered in ED Medications  aspirin tablet 325 mg (325 mg Oral Given 01/30/21 0922)  nitroGLYCERIN (NITROSTAT) SL tablet 0.4 mg (0.4 mg Sublingual Given 01/30/21 0922)  iohexol (OMNIPAQUE) 350 MG/ML injection 75 mL (75 mLs Intravenous Contrast Given 01/30/21 3570)    ED Course/ Medical Decision Making/ A&P                           Medical Decision Making Problems Addressed: Chest pain, unspecified type: acute illness or injury that poses a threat to life or bodily functions  Amount and/or Complexity of Data Reviewed External Data Reviewed: labs and notes. Labs: ordered. Radiology: ordered and independent interpretation performed. Decision-making details documented in ED Course. ECG/medicine tests: ordered and independent interpretation performed.  Risk OTC drugs. Prescription drug management.   Patient is a 61 year old male who presents with chest pain.  He is having episodes of squeezing pain across his chest and upper back.  He does not have associated abdominal pain.  He has had troponins x2 that are flat but slightly elevated in the 30s.  His EKG shows some very subtle ST depression inferiorly.  He does have several risk factors for coronary artery disease.  He had a chest x-ray which showed no acute abnormalities.  This was interpreted by me.  Given his pain that radiated to his back, CTA of his chest was performed which shows no evidence of dissection.  There are noted some gallstones on the CT.  On reexam, he has maybe a little bit of discomfort across his upper abdomen on  palpation.  Maybe more on the right.  His pain could be related to gallstones although he has several risk factors for coronary artery disease.  I consulted with the cardiologist on-call, Dr. Johney Frame.  She recommends the patient get a cardiac CT given his underlying risk factors.  If that is normal, patient can likely be discharged home with follow-up regarding his gallstones.  That study apparently cannot be done here at [facility] and only at [facility].  She requests an ED to ED transfer to have the  cardiac CT done.  I discussed this with the patient and he is okay with this.  I spoke with Dr. Laverta Baltimore who is excepted the patient for transfer to [facility] ED.  Final Clinical Impression(s) / ED Diagnoses Final diagnoses:  Chest pain, unspecified type    Rx / DC Orders ED Discharge Orders     None         Malvin Johns, MD 01/30/21 1122

## 2021-01-30 NOTE — ED Provider Notes (Signed)
Patient presents today as transfer from [facility] for further evaluation in the setting of patient with chest pain with CAD risk factors. CTA chest without evidence of dissection. Gallstones also noted on CT with some RUQ pain on exam. Dr. Tamera Punt at [facility] discussed patient with cardiology Dr. Johney Frame and recommended transfer for cardiac CT which is not able to be performed at [facility].   Physical Exam  BP (!) 137/101    Pulse 87    Temp 98.7 F (37.1 C)    Resp 16    Ht 5\' 9"  (1.753 m)    Wt 86.2 kg    SpO2 96%    BMI 28.06 kg/m   Physical Exam Constitutional:      Appearance: He is well-developed. Resting comfortably in bed in no acute distress HENT:     Head: Normocephalic and atraumatic.  Eyes:     Pupils: Pupils are equal, round, and reactive to light.  Cardiovascular:     Rate and Rhythm: Normal rate and regular rhythm.     Heart sounds: Normal heart sounds.  Pulmonary:     Effort: Pulmonary effort is normal. No respiratory distress.     Breath sounds: Normal breath sounds. No wheezing or rales.  Chest:     Chest wall: No tenderness.  Abdominal:     General: Bowel sounds are normal.     Palpations: Abdomen is soft.     Tenderness: There is no abdominal tenderness. There is no guarding or rebound.  Musculoskeletal:        General: Normal range of motion.     Cervical back: Normal range of motion and neck supple.     Comments: No edema or calf tenderness  Lymphadenopathy:     Cervical: No cervical adenopathy.  Skin:    General: Skin is warm and dry.     Findings: No rash.  Neurological:     Mental Status: He is alert and oriented to person, place, and time.   Procedures  Procedures  ED Course / MDM    Medical Decision Making Amount and/or Complexity of Data Reviewed Labs: ordered. Radiology: ordered.  Risk Prescription drug management. Decision regarding hospitalization.   Discussed patient with cardiology PA Fabian Sharp who saw the patient in the ER  upon arrival and determined that they would cancel cardiac CT and schedule left heart cath. Patient is currently pain free at this time. No further concerns.       Nestor Lewandowsky 01/30/21 1626    Teressa Lower, MD 02/03/21 [phone]

## 2021-01-30 NOTE — Telephone Encounter (Signed)
Entered in error

## 2021-01-30 NOTE — Consult Note (Addendum)
Cardiology History and Physical:   Patient ID: Jacob Ali MRN: [id number]; DOB: 1960-10-01  Admit date: 01/30/2021 Date of Consult: 01/30/2021  PCP:  Pleas Koch, NP   [facility] Providers Cardiologist:  [name], MD new    Patient Profile:   Jacob Ali is a 61 y.o. male with a hx of PAD, GERD, HIV, HLD, HTN, OSA, current smoker, and hx of colon cancer who is being seen 01/30/2021 for the evaluation of chest pain at the request of Dr. Matilde Sprang.  History of Present Illness:   Jacob Ali has a history of PAD with atherectomy and stenting of the left SFA and popliteal arteries with VVS in 2018. He does not have a prior cardiac history. He presented to [facility] with chest pain.   HS troponin 34 --> 30.  LDL 98 (162) Triglycerides 288 (571) A1c 5.7% CTA with coronary calcifications  Cardiology was consulted and the patient was brought to [facility] for further evaluation. During my interview, he reports chest pain that started two weeks ago. CP feels sharp, in the center of his chest and radiates to both shoulders, arms, and his back. The chest pain is associated with nausea, no vomiting or diaphoresis. Chest pain has occurred with walking and with rest and lasts about 2  minutes. He describes chest pain Sunday night that woke him from sleep 3 times. Each time, CP lasted 2 minutes and he was able to go back to sleep.   He smokes daily and uses cocaine about once per month. Last use was Thursday (5 days ago). He describes family history of premature heart disease - both parents have heart failure, MI's in his uncles in their late 58s. 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 While talking with him, he became acutely agitated at his length of wait time, the telemetry leads, and  threatened to leave AMA. He states from what he's read, this is likely a kidney stone or his IBS, which is flaring up.    Past Medical History:  Diagnosis Date   Depression    GERD (gastroesophageal reflux disease)    HIV infection ([place])    Hyperlipidemia    Hypertension    Peripheral vascular disease (HCC)    PONV (postoperative nausea and vomiting)    Sleep apnea    Subdural hemorrhage ([place])    Tubular adenoma of colon 11/03/2017   Colonoscopy Sept 9, 2019    Past Surgical History:  Procedure Laterality Date   Examination under anesthesia, repair of anal fissure,  12/04/2000   PERIPHERAL VASCULAR CATHETERIZATION Left 02/06/2016   Procedure: Lower Extremity Angiography;  Surgeon: Katha Cabal, MD;  Location: [place] [facility];  Service: Cardiovascular;  Laterality: Left;   PERIPHERAL VASCULAR CATHETERIZATION N/A 02/06/2016   Procedure: Abdominal Aortogram w/Lower Extremity;  Surgeon: Katha Cabal, MD;  Location: [place] [facility];  Service: Cardiovascular;  Laterality: N/A;   PERIPHERAL VASCULAR CATHETERIZATION  02/06/2016   Procedure: Lower Extremity Intervention;  Surgeon: Katha Cabal, MD;  Location: [place] [facility];  Service: Cardiovascular;;     Home Medications:  Prior to Admission medications   Medication Sig Start Date End Date Taking? Authorizing Provider  bictegravir-emtricitabine-tenofovir AF (BIKTARVY) 50-200-25 MG TABS tablet TAKE 1 TABLET BY MOUTH DAILY. 01/18/21 01/18/22  Yes Campbell Riches, MD  clopidogrel (PLAVIX) 75 MG tablet TAKE 1 TABLET BY MOUTH EVERY DAY 07/03/20  Yes Kris Hartmann, NP  lisinopril-hydrochlorothiazide (ZESTORETIC) 20-25 MG tablet Take 1 tablet by mouth daily. For blood pressure. 12/05/20  Yes Pleas Koch, NP  Omega-3 Fatty Acids (FISH OIL) 1000 MG CAPS Take by mouth.   Yes [provider]  pantoprazole (PROTONIX) 40 MG tablet Take 1 tablet (40 mg total) by mouth daily. For heartburn. 07/06/20  Yes Pleas Koch, NP  pravastatin (PRAVACHOL) 40 MG tablet Take 1 tablet by mouth daily for cholesterol. 09/19/20  Yes Pleas Koch, NP  traZODone (DESYREL) 50 MG tablet Take 1 tablet (50 mg total) by mouth at bedtime as needed for sleep. 01/25/21  Yes Campbell Riches, MD  ACCU-CHEK GUIDE test strip USE UP TO 4 TIMES A DAY AS DIRECTED Patient not taking: Reported on 01/25/2021 10/18/20   Pleas Koch, NP  Accu-Chek Softclix Lancets lancets USE UP TO 4 TIMES A DAY AS DIRECTED Patient not taking: Reported on 01/25/2021 06/21/19   Pleas Koch, NP  ibuprofen (ADVIL) 800 MG tablet Take 1 tablet (800 mg total) by mouth daily as needed for moderate pain. 12/05/20   Pleas Koch, NP    Inpatient Medications: Scheduled Meds:  aspirin  325 mg Oral Daily   Continuous Infusions:  sodium chloride     PRN Meds: nitroGLYCERIN  Allergies:    Allergies  Allergen Reactions   Atorvastatin Other (See Comments)    myalgias   Tamiflu [Oseltamivir Phosphate] Other (See Comments)    Depressive symptoms    Social History:   Social History   Socioeconomic History   Marital status: Single    Spouse name: Not on file   Number of children: Not on file   Years of education: 12   Highest education level: Not on file  Occupational History   Occupation: Recruitment consultant: [employer]  Tobacco Use   Smoking status: Every Day    Packs/day: 0.50    Years: 40.00    Pack years: 20.00    Types: Cigars, Cigarettes   Smokeless tobacco: Never   Tobacco comments:    1/2PPD  Substance and Sexual Activity   Alcohol use: Yes    Alcohol/week: 3.0 standard drinks    Types: 3 Standard drinks or equivalent per week    Comment: 2-3 times a week   Drug use: Yes    Frequency: 2.0 times per week    Types: Marijuana   Sexual activity: Not Currently    Partners: Male    Birth control/protection: Condom    Comment: pt. given condoms  Other Topics Concern   Not on file  Social  History Narrative   Single, lives alone.     Social Determinants of Health   Financial Resource Strain: Not on file  Food Insecurity: Not on file  Transportation Needs: Not on file  Physical Activity: Not on file  Stress: Not on file  Social Connections: Not on file  Intimate Partner Violence: Not on file    Family History:    Family History  Problem Relation Age of Onset   Throat cancer Father    Heart disease Father    Cancer Father        bladder   Cancer Maternal Grandmother        colon     ROS:  Please see the history of present illness.  All other ROS reviewed and negative.     Physical Exam/Data:   Vitals:   01/30/21 1130 01/30/21 1145 01/30/21 1300 01/30/21 1400  BP: 140/80 (!) 143/76 (!) 133/110 (!) 182/99  Pulse:   80 89  Resp: 15 15 15 17   Temp:      SpO2:  98% 98% 96%  Weight:      Height:       No intake or output data in the 24 hours ending 01/30/21 1433 Last 3 Weights 01/30/2021 01/25/2021 09/13/2020  Weight (lbs) 190 lb 190 lb 191 lb  Weight (kg) 86.183 kg 86.183 kg 86.637 kg  Some encounter information is confidential and restricted. Go to Review Flowsheets activity to see all data.     Body mass index is 28.06 kg/m.  General:  Well nourished, well developed, in no acute distress HEENT: normal Neck: no JVD Vascular: No carotid bruits; Distal pulses 2+ bilaterally Cardiac:  normal S1, S2; RRR; no murmur  Lungs:  clear to auscultation bilaterally, no wheezing, rhonchi or rales  Abd: soft, nontender, no hepatomegaly  Ext: no edema Musculoskeletal:  No deformities, BUE and BLE strength normal and equal Skin: warm and dry  Neuro:  CNs 2-12 intact, no focal abnormalities noted Psych:  Normal affect   EKG:  The EKG was personally reviewed and demonstrates:  SR HR 94, mild ST depression in inferior and lateral leads - appears new since 2016 Telemetry:  Telemetry was personally reviewed and demonstrates:  sinus rhythm in the 80s  Relevant CV  Studies:  Left heart cath pending  Echo pending   Laboratory Data:  High Sensitivity Troponin:   Recent Labs  Lab 01/30/21 0710 01/30/21 0924  TROPONINIHS 34* 30*     Chemistry Recent Labs  Lab 01/30/21 0710  NA 135  K 4.1  CL 104  CO2 21*  GLUCOSE 225*  BUN 22  CREATININE 0.93  CALCIUM 9.5  GFRNONAA >60  ANIONGAP 10    Recent Labs  Lab 01/24/21 0954 01/30/21 0924  PROT 6.8 7.0  ALBUMIN 4.6 4.5  AST 15 14*  ALT 19 16  ALKPHOS 55 48  BILITOT 0.5 0.4   Lipids  Recent Labs  Lab 01/24/21 0954  CHOL 169  TRIG 288.0*  HDL 29.00*  CHOLHDL 6    Hematology Recent Labs  Lab 01/30/21 0710  WBC 9.2  RBC 4.26  HGB 14.3  HCT 42.0  MCV 98.6  MCH 33.6  MCHC 34.0  RDW 12.8  PLT 260   Thyroid No results for input(s): TSH, FREET4 in the last 168 hours.  BNPNo results for input(s): BNP, PROBNP in the last 168 hours.  DDimer No results for input(s): DDIMER in the last 168 hours.   Radiology/Studies:  DG Chest 2 View  Result Date: 01/30/2021 CLINICAL DATA:  Chest pain EXAM: CHEST - 2 VIEW COMPARISON:  Chest x-ray dated April 07, 2015 FINDINGS: The heart size and mediastinal contours are within normal limits. Both lungs are clear. The visualized skeletal structures are unremarkable. IMPRESSION: No active cardiopulmonary disease. Electronically Signed   By: Yetta Glassman M.D.   On: 01/30/2021 08:11   CT Angio Chest Aorta W and/or Wo Contrast  Result Date: 01/30/2021 CLINICAL DATA:  Acute aortic syndrome with chest pain radiating to the back. EXAM: CT ANGIOGRAPHY CHEST WITH CONTRAST TECHNIQUE: Multidetector CT imaging of the chest was performed using the standard protocol during bolus administration of intravenous contrast. Multiplanar CT image reconstructions and MIPs were obtained to evaluate  the vascular anatomy. RADIATION DOSE REDUCTION: This exam was performed according to the departmental dose-optimization program which includes automated exposure control,  adjustment of the mA and/or kV according to patient size and/or use of iterative reconstruction technique. CONTRAST:  83mL OMNIPAQUE IOHEXOL 350 MG/ML SOLN COMPARISON:  None. FINDINGS: Cardiovascular: 2 vessel aortic arch. The right brachiocephalic and left common carotid artery share a common origin. Mild atherosclerotic plaque in the aortic arch and proximal arch vessels. No evidence of aneurysm or dissection. The main pulmonary artery is normal in size. No evidence of pulmonary embolism. Scattered calcifications visualized along the left anterior descending and circumflex coronary arteries. The heart is normal in size. No pericardial effusion. Mediastinum/Nodes: Unremarkable CT appearance of the thyroid gland. No suspicious mediastinal or hilar adenopathy. No soft tissue mediastinal mass. The thoracic esophagus is unremarkable. Lungs/Pleura: Lungs are clear. No pleural effusion or pneumothorax. Upper Abdomen: No acute abnormality. Numerous calcified stones visualized within the gallbladder lumen. Musculoskeletal: No chest wall abnormality. No acute or significant osseous findings. Review of the MIP images confirms the above findings. IMPRESSION: 1. Negative for aortic dissection, aneurysm or other acute vascular abnormality. 2. Atherosclerotic calcifications are visualized along the aorta and coronary arteries. 3. Cholelithiasis noted incidentally. Aortic Atherosclerosis (ICD10-I70.0). Electronically Signed   By: Jacqulynn Cadet M.D.   On: 01/30/2021 09:39     Assessment and Plan:   Chest pain - CE low and flat, inconsistent with ACS - pt describes chest pain with typical and atypical features - CTA reviewed and shows coronary calcifications in LAD and CX - risk factors include HIV, smoking, PAD, and family history of heart disease - given that he had a CTA today that showed calcifications, may consider definitive angiography   Hypertension - maintained on lisinopril-HCTZ - will give hydralazine  for BP 182/99 - start low dose beta blocker - he states he will no longer use cocaine   Hyperlipidemia with LDL goal < 70 - given family history, may consider LDL less than 55 - LDL and triglycerides have been coming down - on 40 mg pravastatin - consider switching to a high intensity statin, such as 40 mg crestor   PAD - atherectomy and stenting of the left SFA/popliteal in 2018 - on 325 mg ASA and plavix - recommend reducing ASA to 81 mg in the setting of plavix - would also consider D/C 800 mg ibuprofen with DAPT   Polysubstance abuse - current smoker - uses cocaine about once monthly  - last cocaine use was last Thursday, but CP predates last use - he agrees with cocaine abstinence going forward   Will plan for left heart cath this afternoon.   Risk Assessment/Risk Scores:     TIMI Risk Score for Unstable Angina or Non-ST Elevation MI:   The patient's TIMI risk score is 5, which indicates a 26% risk of all cause mortality, new or recurrent myocardial infarction or need for urgent revascularization in the next 14 days.      For questions or updates, please contact [place] Please consult [email or web address] for contact info under    Signed, Ledora Bottcher, [place]  01/30/2021 2:33 PM  Patient seen and examined and agree with Fabian Sharp, PA as detailed above.  In brief, the patient is a 61 year old male with history of PAD s/p SFA stenting, HIV, HTN, HLD, OSA, and current smoker who presented to [facility] ER with chest pain found to have trop 34>30 with chest pain responsive to nitro now transferred  to [facility] for further management.   The patient states that he has been having chest pain over the past 2 weeks. The pain occurs both with walking and while at rest with periods that it feels sharp and radiates to the shoulders and periods where is feels like a "tightness" in his chest. He had 3 episodes that woke him from sleep prompting him to go to the ER.   In the ER  trops flat in the 30s. ECG with nonspecific ST-T wave changes. His pain was relieved with SL NTG. CTA PE protocol with no PE but with scattered coronary calcification.  Of note, the patient has used cocaine with last use 5 days ago. Chest pain preceded the use of cocaine.  While the patient's symptoms are somewhat atypical, given his risk factors including HTN, HLD, tobacco use, family history of premature CAD (uncles with MI in 92s; father with multiple PCIs and HF), and HIV will plan for coronary angiography. Discussed this at length with the patient at the bedside as well as the option for coronary CTA but he would rather pursue cath because if CTA were concerning, he would need cath at that time.   GEN: No acute distress.   Neck: No JVD Cardiac: RRR, no murmurs, rubs, or gallops.  Respiratory: Clear to auscultation bilaterally. GI: Soft, nontender, non-distended  MS: No edema; No deformity. Neuro:  Nonfocal  Psych: Anxious  Plan: -Plan for LHC today -Check TTE -S/p ASA 325mg  daily; will plan for ASA 81mg  daily pending cath -Continue home plavix 75mg  daily (on it for PAD) -Continue coreg 3.125mg  BID -Continue pravastatin 40mg  daily; will change to crestor pending cath findings -Tobacco and cocaine cessation counseling  -Continue home HIV medications  INFORMED CONSENT: I have reviewed the risks, indications, and alternatives to cardiac catheterization, possible angioplasty, and stenting with the patient. Risks include but are not limited to bleeding, infection, vascular injury, stroke, myocardial infection, arrhythmia, kidney injury, radiation-related injury in the case of prolonged fluoroscopy use, emergency cardiac surgery, and death. The patient understands the risks of serious complication is 1-2 in [id number] with diagnostic cardiac cath and 1-2% or less with angioplasty/stenting.    Gwyndolyn Kaufman, MD

## 2021-01-30 NOTE — ED Triage Notes (Incomplete Revision)
Kback pain that radiates across  chest that comes and goes x several weeks and now last 3 days it has gotten worse  still coming and going  Sunday being the worse

## 2021-01-30 NOTE — H&P (View-Only) (Signed)
Cardiology History and Physical:   Patient ID: Jacob Ali MRN: [id number]; DOB: February 06, 1960  Admit date: 01/30/2021 Date of Consult: 01/30/2021  PCP:  Pleas Koch, NP   [facility] Providers Cardiologist:  [name], MD new    Patient Profile:   Jacob Ali is a 61 y.o. male with a hx of PAD, GERD, HIV, HLD, HTN, OSA, current smoker, and hx of colon cancer who is being seen 01/30/2021 for the evaluation of chest pain at the request of Dr. Matilde Sprang.  History of Present Illness:   Jacob Ali has a history of PAD with atherectomy and stenting of the left SFA and popliteal arteries with VVS in 2018. He does not have a prior cardiac history. He presented to [facility] with chest pain.   HS troponin 34 --> 30.  LDL 98 (162) Triglycerides 288 (571) A1c 5.7% CTA with coronary calcifications  Cardiology was consulted and the patient was brought to [facility] for further evaluation. During my interview, he reports chest pain that started two weeks ago. CP feels sharp, in the center of his chest and radiates to both shoulders, arms, and his back. The chest pain is associated with nausea, no vomiting or diaphoresis. Chest pain has occurred with walking and with rest and lasts about 2  minutes. He describes chest pain Sunday night that woke him from sleep 3 times. Each time, CP lasted 2 minutes and he was able to go back to sleep.   He smokes daily and uses cocaine about once per month. Last use was Thursday (5 days ago). He describes family history of premature heart disease - both parents have heart failure, MI's in his uncles in their late 96s. 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 While talking with him, he became acutely agitated at his length of wait time, the telemetry leads, and  threatened to leave AMA. He states from what he's read, this is likely a kidney stone or his IBS, which is flaring up.    Past Medical History:  Diagnosis Date   Depression    GERD (gastroesophageal reflux disease)    HIV infection ([place])    Hyperlipidemia    Hypertension    Peripheral vascular disease (HCC)    PONV (postoperative nausea and vomiting)    Sleep apnea    Subdural hemorrhage ([place])    Tubular adenoma of colon 11/03/2017   Colonoscopy Sept 9, 2019    Past Surgical History:  Procedure Laterality Date   Examination under anesthesia, repair of anal fissure,  12/04/2000   PERIPHERAL VASCULAR CATHETERIZATION Left 02/06/2016   Procedure: Lower Extremity Angiography;  Surgeon: Katha Cabal, MD;  Location: [place] [facility];  Service: Cardiovascular;  Laterality: Left;   PERIPHERAL VASCULAR CATHETERIZATION N/A 02/06/2016   Procedure: Abdominal Aortogram w/Lower Extremity;  Surgeon: Katha Cabal, MD;  Location: [place] [facility];  Service: Cardiovascular;  Laterality: N/A;   PERIPHERAL VASCULAR CATHETERIZATION  02/06/2016   Procedure: Lower Extremity Intervention;  Surgeon: Katha Cabal, MD;  Location: [place] [facility];  Service: Cardiovascular;;     Home Medications:  Prior to Admission medications   Medication Sig Start Date End Date Taking? Authorizing Provider  bictegravir-emtricitabine-tenofovir AF (BIKTARVY) 50-200-25 MG TABS tablet TAKE 1 TABLET BY MOUTH DAILY. 01/18/21 01/18/22  Yes Campbell Riches, MD  clopidogrel (PLAVIX) 75 MG tablet TAKE 1 TABLET BY MOUTH EVERY DAY 07/03/20  Yes Kris Hartmann, NP  lisinopril-hydrochlorothiazide (ZESTORETIC) 20-25 MG tablet Take 1 tablet by mouth daily. For blood pressure. 12/05/20  Yes Pleas Koch, NP  Omega-3 Fatty Acids (FISH OIL) 1000 MG CAPS Take by mouth.   Yes [provider]  pantoprazole (PROTONIX) 40 MG tablet Take 1 tablet (40 mg total) by mouth daily. For heartburn. 07/06/20  Yes Pleas Koch, NP  pravastatin (PRAVACHOL) 40 MG tablet Take 1 tablet by mouth daily for cholesterol. 09/19/20  Yes Pleas Koch, NP  traZODone (DESYREL) 50 MG tablet Take 1 tablet (50 mg total) by mouth at bedtime as needed for sleep. 01/25/21  Yes Campbell Riches, MD  ACCU-CHEK GUIDE test strip USE UP TO 4 TIMES A DAY AS DIRECTED Patient not taking: Reported on 01/25/2021 10/18/20   Pleas Koch, NP  Accu-Chek Softclix Lancets lancets USE UP TO 4 TIMES A DAY AS DIRECTED Patient not taking: Reported on 01/25/2021 06/21/19   Pleas Koch, NP  ibuprofen (ADVIL) 800 MG tablet Take 1 tablet (800 mg total) by mouth daily as needed for moderate pain. 12/05/20   Pleas Koch, NP    Inpatient Medications: Scheduled Meds:  aspirin  325 mg Oral Daily   Continuous Infusions:  sodium chloride     PRN Meds: nitroGLYCERIN  Allergies:    Allergies  Allergen Reactions   Atorvastatin Other (See Comments)    myalgias   Tamiflu [Oseltamivir Phosphate] Other (See Comments)    Depressive symptoms    Social History:   Social History   Socioeconomic History   Marital status: Single    Spouse name: Not on file   Number of children: Not on file   Years of education: 12   Highest education level: Not on file  Occupational History   Occupation: Recruitment consultant: [employer]  Tobacco Use   Smoking status: Every Day    Packs/day: 0.50    Years: 40.00    Pack years: 20.00    Types: Cigars, Cigarettes   Smokeless tobacco: Never   Tobacco comments:    1/2PPD  Substance and Sexual Activity   Alcohol use: Yes    Alcohol/week: 3.0 standard drinks    Types: 3 Standard drinks or equivalent per week    Comment: 2-3 times a week   Drug use: Yes    Frequency: 2.0 times per week    Types: Marijuana   Sexual activity: Not Currently    Partners: Male    Birth control/protection: Condom    Comment: pt. given condoms  Other Topics Concern   Not on file  Social  History Narrative   Single, lives alone.     Social Determinants of Health   Financial Resource Strain: Not on file  Food Insecurity: Not on file  Transportation Needs: Not on file  Physical Activity: Not on file  Stress: Not on file  Social Connections: Not on file  Intimate Partner Violence: Not on file    Family History:    Family History  Problem Relation Age of Onset   Throat cancer Father    Heart disease Father    Cancer Father        bladder   Cancer Maternal Grandmother        colon     ROS:  Please see the history of present illness.  All other ROS reviewed and negative.     Physical Exam/Data:   Vitals:   01/30/21 1130 01/30/21 1145 01/30/21 1300 01/30/21 1400  BP: 140/80 (!) 143/76 (!) 133/110 (!) 182/99  Pulse:   80 89  Resp: 15 15 15 17   Temp:      SpO2:  98% 98% 96%  Weight:      Height:       No intake or output data in the 24 hours ending 01/30/21 1433 Last 3 Weights 01/30/2021 01/25/2021 09/13/2020  Weight (lbs) 190 lb 190 lb 191 lb  Weight (kg) 86.183 kg 86.183 kg 86.637 kg  Some encounter information is confidential and restricted. Go to Review Flowsheets activity to see all data.     Body mass index is 28.06 kg/m.  General:  Well nourished, well developed, in no acute distress HEENT: normal Neck: no JVD Vascular: No carotid bruits; Distal pulses 2+ bilaterally Cardiac:  normal S1, S2; RRR; no murmur  Lungs:  clear to auscultation bilaterally, no wheezing, rhonchi or rales  Abd: soft, nontender, no hepatomegaly  Ext: no edema Musculoskeletal:  No deformities, BUE and BLE strength normal and equal Skin: warm and dry  Neuro:  CNs 2-12 intact, no focal abnormalities noted Psych:  Normal affect   EKG:  The EKG was personally reviewed and demonstrates:  SR HR 94, mild ST depression in inferior and lateral leads - appears new since 2016 Telemetry:  Telemetry was personally reviewed and demonstrates:  sinus rhythm in the 80s  Relevant CV  Studies:  Left heart cath pending  Echo pending   Laboratory Data:  High Sensitivity Troponin:   Recent Labs  Lab 01/30/21 0710 01/30/21 0924  TROPONINIHS 34* 30*     Chemistry Recent Labs  Lab 01/30/21 0710  NA 135  K 4.1  CL 104  CO2 21*  GLUCOSE 225*  BUN 22  CREATININE 0.93  CALCIUM 9.5  GFRNONAA >60  ANIONGAP 10    Recent Labs  Lab 01/24/21 0954 01/30/21 0924  PROT 6.8 7.0  ALBUMIN 4.6 4.5  AST 15 14*  ALT 19 16  ALKPHOS 55 48  BILITOT 0.5 0.4   Lipids  Recent Labs  Lab 01/24/21 0954  CHOL 169  TRIG 288.0*  HDL 29.00*  CHOLHDL 6    Hematology Recent Labs  Lab 01/30/21 0710  WBC 9.2  RBC 4.26  HGB 14.3  HCT 42.0  MCV 98.6  MCH 33.6  MCHC 34.0  RDW 12.8  PLT 260   Thyroid No results for input(s): TSH, FREET4 in the last 168 hours.  BNPNo results for input(s): BNP, PROBNP in the last 168 hours.  DDimer No results for input(s): DDIMER in the last 168 hours.   Radiology/Studies:  DG Chest 2 View  Result Date: 01/30/2021 CLINICAL DATA:  Chest pain EXAM: CHEST - 2 VIEW COMPARISON:  Chest x-ray dated April 07, 2015 FINDINGS: The heart size and mediastinal contours are within normal limits. Both lungs are clear. The visualized skeletal structures are unremarkable. IMPRESSION: No active cardiopulmonary disease. Electronically Signed   By: Yetta Glassman M.D.   On: 01/30/2021 08:11   CT Angio Chest Aorta W and/or Wo Contrast  Result Date: 01/30/2021 CLINICAL DATA:  Acute aortic syndrome with chest pain radiating to the back. EXAM: CT ANGIOGRAPHY CHEST WITH CONTRAST TECHNIQUE: Multidetector CT imaging of the chest was performed using the standard protocol during bolus administration of intravenous contrast. Multiplanar CT image reconstructions and MIPs were obtained to evaluate  the vascular anatomy. RADIATION DOSE REDUCTION: This exam was performed according to the departmental dose-optimization program which includes automated exposure control,  adjustment of the mA and/or kV according to patient size and/or use of iterative reconstruction technique. CONTRAST:  33mL OMNIPAQUE IOHEXOL 350 MG/ML SOLN COMPARISON:  None. FINDINGS: Cardiovascular: 2 vessel aortic arch. The right brachiocephalic and left common carotid artery share a common origin. Mild atherosclerotic plaque in the aortic arch and proximal arch vessels. No evidence of aneurysm or dissection. The main pulmonary artery is normal in size. No evidence of pulmonary embolism. Scattered calcifications visualized along the left anterior descending and circumflex coronary arteries. The heart is normal in size. No pericardial effusion. Mediastinum/Nodes: Unremarkable CT appearance of the thyroid gland. No suspicious mediastinal or hilar adenopathy. No soft tissue mediastinal mass. The thoracic esophagus is unremarkable. Lungs/Pleura: Lungs are clear. No pleural effusion or pneumothorax. Upper Abdomen: No acute abnormality. Numerous calcified stones visualized within the gallbladder lumen. Musculoskeletal: No chest wall abnormality. No acute or significant osseous findings. Review of the MIP images confirms the above findings. IMPRESSION: 1. Negative for aortic dissection, aneurysm or other acute vascular abnormality. 2. Atherosclerotic calcifications are visualized along the aorta and coronary arteries. 3. Cholelithiasis noted incidentally. Aortic Atherosclerosis (ICD10-I70.0). Electronically Signed   By: Jacqulynn Cadet M.D.   On: 01/30/2021 09:39     Assessment and Plan:   Chest pain - CE low and flat, inconsistent with ACS - pt describes chest pain with typical and atypical features - CTA reviewed and shows coronary calcifications in LAD and CX - risk factors include HIV, smoking, PAD, and family history of heart disease - given that he had a CTA today that showed calcifications, may consider definitive angiography   Hypertension - maintained on lisinopril-HCTZ - will give hydralazine  for BP 182/99 - start low dose beta blocker - he states he will no longer use cocaine   Hyperlipidemia with LDL goal < 70 - given family history, may consider LDL less than 55 - LDL and triglycerides have been coming down - on 40 mg pravastatin - consider switching to a high intensity statin, such as 40 mg crestor   PAD - atherectomy and stenting of the left SFA/popliteal in 2018 - on 325 mg ASA and plavix - recommend reducing ASA to 81 mg in the setting of plavix - would also consider D/C 800 mg ibuprofen with DAPT   Polysubstance abuse - current smoker - uses cocaine about once monthly  - last cocaine use was last Thursday, but CP predates last use - he agrees with cocaine abstinence going forward   Will plan for left heart cath this afternoon.   Risk Assessment/Risk Scores:     TIMI Risk Score for Unstable Angina or Non-ST Elevation MI:   The patient's TIMI risk score is 5, which indicates a 26% risk of all cause mortality, new or recurrent myocardial infarction or need for urgent revascularization in the next 14 days.      For questions or updates, please contact [place] Please consult [email or web address] for contact info under    Signed, Ledora Bottcher, [place]  01/30/2021 2:33 PM  Patient seen and examined and agree with Fabian Sharp, PA as detailed above.  In brief, the patient is a 61 year old male with history of PAD s/p SFA stenting, HIV, HTN, HLD, OSA, and current smoker who presented to [facility] ER with chest pain found to have trop 34>30 with chest pain responsive to nitro now transferred  to [facility] for further management.   The patient states that he has been having chest pain over the past 2 weeks. The pain occurs both with walking and while at rest with periods that it feels sharp and radiates to the shoulders and periods where is feels like a "tightness" in his chest. He had 3 episodes that woke him from sleep prompting him to go to the ER.   In the ER  trops flat in the 30s. ECG with nonspecific ST-T wave changes. His pain was relieved with SL NTG. CTA PE protocol with no PE but with scattered coronary calcification.  Of note, the patient has used cocaine with last use 5 days ago. Chest pain preceded the use of cocaine.  While the patient's symptoms are somewhat atypical, given his risk factors including HTN, HLD, tobacco use, family history of premature CAD (uncles with MI in 91s; father with multiple PCIs and HF), and HIV will plan for coronary angiography. Discussed this at length with the patient at the bedside as well as the option for coronary CTA but he would rather pursue cath because if CTA were concerning, he would need cath at that time.   GEN: No acute distress.   Neck: No JVD Cardiac: RRR, no murmurs, rubs, or gallops.  Respiratory: Clear to auscultation bilaterally. GI: Soft, nontender, non-distended  MS: No edema; No deformity. Neuro:  Nonfocal  Psych: Anxious  Plan: -Plan for LHC today -Check TTE -S/p ASA 325mg  daily; will plan for ASA 81mg  daily pending cath -Continue home plavix 75mg  daily (on it for PAD) -Continue coreg 3.125mg  BID -Continue pravastatin 40mg  daily; will change to crestor pending cath findings -Tobacco and cocaine cessation counseling  -Continue home HIV medications  INFORMED CONSENT: I have reviewed the risks, indications, and alternatives to cardiac catheterization, possible angioplasty, and stenting with the patient. Risks include but are not limited to bleeding, infection, vascular injury, stroke, myocardial infection, arrhythmia, kidney injury, radiation-related injury in the case of prolonged fluoroscopy use, emergency cardiac surgery, and death. The patient understands the risks of serious complication is 1-2 in 1021 with diagnostic cardiac cath and 1-2% or less with angioplasty/stenting.    Gwyndolyn Kaufman, MD

## 2021-01-30 NOTE — CV Procedure (Signed)
60-70 percent eccentric  RCA.  Hemodynamics were not assessed. Left main is widely patent Mid LAD eccentric 90% after first diagonal and septal perforator.  First diagonal contains 80% ostial. Widely patent circumflex. PCI LAD culprit reduced from 9095% to 0% with TIMI grade III flow using a 2.75 x 15 mm Synergy postdilated to 3.0 mm in diameter.

## 2021-01-30 NOTE — ED Notes (Addendum)
Pt bib [facility] from [employer], with chest pain and back pain that comes and goes. Pt denies pain at this time. AOx4. VSS

## 2021-01-30 NOTE — ED Notes (Signed)
[facility] here to get pt, report to [facility] and CT

## 2021-01-30 NOTE — ED Triage Notes (Signed)
Kback pain that radiates across  chest that comes and goes x several weeks and now last 3 days it has gotten worse  still coming and going

## 2021-01-30 NOTE — Interval H&P Note (Signed)
Cath Lab Visit (complete for each Cath Lab visit)  Clinical Evaluation Leading to the Procedure:   ACS: Yes.    Non-ACS:    Anginal Classification: CCS III  Anti-ischemic medical therapy: Minimal Therapy (1 class of medications)  Non-Invasive Test Results: No non-invasive testing performed  Prior CABG: No previous CABG      History and Physical Interval Note:  01/30/2021 4:36 PM  Jacob Ali  has presented today for surgery, with the diagnosis of unstable angina.  The various methods of treatment have been discussed with the patient and family. After consideration of risks, benefits and other options for treatment, the patient has consented to  Procedure(s): LEFT HEART CATH AND CORONARY ANGIOGRAPHY (N/A) as a surgical intervention.  The patient's history has been reviewed, patient examined, no change in status, stable for surgery.  I have reviewed the patient's chart and labs.  Questions were answered to the patient's satisfaction.     Belva Crome III

## 2021-01-31 ENCOUNTER — Encounter (HOSPITAL_COMMUNITY): Payer: Self-pay | Admitting: Interventional Cardiology

## 2021-01-31 ENCOUNTER — Other Ambulatory Visit (HOSPITAL_COMMUNITY): Payer: Self-pay

## 2021-01-31 ENCOUNTER — Inpatient Hospital Stay (HOSPITAL_COMMUNITY): Payer: No Typology Code available for payment source

## 2021-01-31 ENCOUNTER — Telehealth: Payer: Self-pay

## 2021-01-31 DIAGNOSIS — R079 Chest pain, unspecified: Secondary | ICD-10-CM | POA: Diagnosis not present

## 2021-01-31 DIAGNOSIS — I2510 Atherosclerotic heart disease of native coronary artery without angina pectoris: Secondary | ICD-10-CM | POA: Diagnosis not present

## 2021-01-31 DIAGNOSIS — E782 Mixed hyperlipidemia: Secondary | ICD-10-CM | POA: Diagnosis not present

## 2021-01-31 DIAGNOSIS — Z95820 Peripheral vascular angioplasty status with implants and grafts: Secondary | ICD-10-CM

## 2021-01-31 DIAGNOSIS — I10 Essential (primary) hypertension: Secondary | ICD-10-CM

## 2021-01-31 DIAGNOSIS — I200 Unstable angina: Secondary | ICD-10-CM | POA: Diagnosis not present

## 2021-01-31 HISTORY — DX: Peripheral vascular angioplasty status with implants and grafts: Z95.820

## 2021-01-31 HISTORY — DX: Atherosclerotic heart disease of native coronary artery without angina pectoris: I25.10

## 2021-01-31 LAB — ECHOCARDIOGRAM COMPLETE
Area-P 1/2: 3.85 cm2
Height: 69 in
S' Lateral: 3.4 cm
Weight: 2962.98 oz

## 2021-01-31 LAB — CBC
HCT: 38.8 % — ABNORMAL LOW (ref 39.0–52.0)
Hemoglobin: 13.3 g/dL (ref 13.0–17.0)
MCH: 34.5 pg — ABNORMAL HIGH (ref 26.0–34.0)
MCHC: 34.3 g/dL (ref 30.0–36.0)
MCV: 100.8 fL — ABNORMAL HIGH (ref 80.0–100.0)
Platelets: 242 10*3/uL (ref 150–400)
RBC: 3.85 MIL/uL — ABNORMAL LOW (ref 4.22–5.81)
RDW: 12.7 % (ref 11.5–15.5)
WBC: 8.9 10*3/uL (ref 4.0–10.5)
nRBC: 0 % (ref 0.0–0.2)

## 2021-01-31 LAB — TROPONIN I (HIGH SENSITIVITY): Troponin I (High Sensitivity): 31 ng/L — ABNORMAL HIGH (ref <18)

## 2021-01-31 LAB — BASIC METABOLIC PANEL
Anion gap: 10 (ref 5–15)
BUN: 18 mg/dL (ref 8–23)
CO2: 23 mmol/L (ref 22–32)
Calcium: 9 mg/dL (ref 8.9–10.3)
Chloride: 100 mmol/L (ref 98–111)
Creatinine, Ser: 0.91 mg/dL (ref 0.61–1.24)
GFR, Estimated: >60 mL/min (ref >60)
Glucose, Bld: 166 mg/dL — ABNORMAL HIGH (ref 70–99)
Potassium: 3.9 mmol/L (ref 3.5–5.1)
Sodium: 133 mmol/L — ABNORMAL LOW (ref 135–145)

## 2021-01-31 MED ORDER — ACETAMINOPHEN 325 MG PO TABS: 650.0000 mg | ORAL_TABLET | ORAL | Status: DC | PRN

## 2021-01-31 MED ORDER — ROSUVASTATIN CALCIUM 40 MG PO TABS
40.0000 mg | ORAL_TABLET | Freq: Every day | ORAL | 6 refills | Status: DC
  Filled 2021-01-31: qty 30, 30d supply, fill #0

## 2021-01-31 MED ORDER — NITROGLYCERIN 0.4 MG SL SUBL
0.4000 mg | SUBLINGUAL_TABLET | SUBLINGUAL | 4 refills | Status: DC | PRN
  Filled 2021-01-31: qty 25, 7d supply, fill #0

## 2021-01-31 MED ORDER — LISINOPRIL 20 MG PO TABS
20.0000 mg | ORAL_TABLET | Freq: Every day | ORAL | 6 refills | Status: DC
  Filled 2021-01-31: qty 30, 30d supply, fill #0

## 2021-01-31 MED ORDER — ASPIRIN 81 MG PO CHEW: 81.0000 mg | CHEWABLE_TABLET | Freq: Every day | ORAL | Status: DC

## 2021-01-31 MED ORDER — LISINOPRIL 20 MG PO TABS
20.0000 mg | ORAL_TABLET | Freq: Every day | ORAL | Status: DC
  Administered 2021-01-31: 20 mg via ORAL
  Filled 2021-01-31: qty 1

## 2021-01-31 MED ORDER — CARVEDILOL 3.125 MG PO TABS
3.1250 mg | ORAL_TABLET | Freq: Two times a day (BID) | ORAL | 6 refills | Status: DC
  Filled 2021-01-31: qty 60, 30d supply, fill #0

## 2021-01-31 NOTE — Discharge Instructions (Addendum)
Take 1 NTG, under your tongue, while sitting.  If no relief of pain may repeat NTG, one tab every 5 minutes up to 3 tablets total over 15 minutes.  If no relief CALL 911.  If you have dizziness/lightheadness  while taking NTG, stop taking and call 911.        Call [facility] at [phone] if any bleeding, swelling or drainage at cath site.  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 No lifting over 5 pounds for 3 days.  No Driving for 3 days   Do not stop asprin and plavix, these medications keep your stent open, stopping the meds could cause a heart attack.  Please stop with tobacco or any meds not prescribed.    We changed your Pravachol to Crestor and adjusted your lisinopril to plain lisinopril and we added carvedilol

## 2021-01-31 NOTE — TOC Progression Note (Signed)
Transition of Care [facility]) - Progression Note    Patient Details  Name: Jacob Ali MRN: [id number] Date of Birth: 03-16-1960  Transition of Care [facility]) CM/SW [place], RN Phone Number:[phone]  01/31/2021, 8:57 AM  Clinical Narrative:     Transition of Care [facility]) Screening Note   Patient Details  Name: Jacob Ali Date of Birth: 10-09-1960   Transition of Care [facility]) CM/SW Contact:    Angelita Ingles, RN Phone Number: 01/31/2021, 8:57 AM    Transition of Care Department [facility]) has reviewed patient and no TOC needs have been identified at this time. We will continue to monitor patient advancement through interdisciplinary progression rounds. If new patient transition needs arise, please place a TOC consult.          Expected Discharge Plan and Services                                                 Social Determinants of Health (SDOH) Interventions    Readmission Risk Interventions No flowsheet data found.

## 2021-01-31 NOTE — Progress Notes (Signed)
Progress Note  Patient Name: Jacob Ali Date of Encounter: 01/31/2021  [facility] Cardiologist: [name], MD   Subjective   Doing well this morning. Right radial access site c/d/I. No chest pain.  Inpatient Medications    Scheduled Meds:  aspirin  81 mg Oral Daily   bictegravir-emtricitabine-tenofovir AF  1 tablet Oral Daily   carvedilol  3.125 mg Oral BID WC   clopidogrel  75 mg Oral Daily   heparin  5,000 Units Subcutaneous Q8H   pantoprazole  40 mg Oral Daily   rosuvastatin  40 mg Oral Daily   sodium chloride flush  3 mL Intravenous Q12H   Continuous Infusions:  sodium chloride     sodium chloride     PRN Meds: sodium chloride, acetaminophen, nitroGLYCERIN, ondansetron (ZOFRAN) IV, oxyCODONE, sodium chloride flush, traZODone   Vital Signs    Vitals:   01/30/21 1937 01/30/21 2329 01/31/21 0329 01/31/21 0556  BP: (!) 145/66 136/79 (!) 145/74   Pulse: 84 79 71   Resp: 15 17 11    Temp: 97.7 F (36.5 C) 97.6 F (36.4 C) 98.3 F (36.8 C)   TempSrc: Oral Oral Oral   SpO2: 97% 96% 96%   Weight:    84 kg  Height:        Intake/Output Summary (Last 24 hours) at 01/31/2021 0743 Last data filed at 01/31/2021 0556 Gross per 24 hour  Intake 603 ml  Output 1400 ml  Net -797 ml   Last 3 Weights 01/31/2021 01/30/2021 01/25/2021  Weight (lbs) 185 lb 3 oz 190 lb 190 lb  Weight (kg) 84 kg 86.183 kg 86.183 kg      Telemetry    NSR - Personally Reviewed  ECG    NSR - Personally Reviewed  Physical Exam   GEN: No acute distress.   Neck: No JVD Cardiac: RRR, no murmurs, rubs, or gallops. Right radial access site c/d/i Respiratory: Clear to auscultation bilaterally. GI: Soft, nontender, non-distended  MS: No edema; No deformity. Neuro:  Nonfocal  Psych: Normal affect   Labs    High Sensitivity Troponin:   Recent Labs  Lab 01/30/21 0710 01/30/21 0924 01/31/21 0513  TROPONINIHS 34* 30* 31*     Chemistry Recent Labs  Lab 01/24/21 0954  01/30/21 0710 01/30/21 0710 01/30/21 0924 01/30/21 1603 01/30/21 2040 01/31/21 0055  NA  --  135  --   --   --   --  133*  K  --  4.1  --   --   --   --  3.9  CL  --  104  --   --   --   --  100  CO2  --  21*  --   --   --   --  23  GLUCOSE  --  225*  --   --   --   --  166*  BUN  --  22  --   --   --   --  18  CREATININE  --  0.93   < >  --  0.86 0.97 0.91  CALCIUM  --  9.5  --   --   --   --  9.0  PROT 6.8  --   --  7.0  --   --   --   ALBUMIN 4.6  --   --  4.5  --   --   --   AST 15  --   --  14*  --   --   --  ALT 19  --   --  16  --   --   --   ALKPHOS 55  --   --  48  --   --   --   BILITOT 0.5  --   --  0.4  --   --   --   GFRNONAA  --  >60   < >  --  >60 >60 >60  ANIONGAP  --  10  --   --   --   --  10   < > = values in this interval not displayed.    Lipids  Recent Labs  Lab 01/24/21 0954  CHOL 169  TRIG 288.0*  HDL 29.00*  CHOLHDL 6    Hematology Recent Labs  Lab 01/30/21 1603 01/30/21 2040 01/31/21 0055  WBC 9.1 7.8 8.9  RBC 4.25 3.90* 3.85*  HGB 14.3 12.9* 13.3  HCT 42.2 39.3 38.8*  MCV 99.3 100.8* 100.8*  MCH 33.6 33.1 34.5*  MCHC 33.9 32.8 34.3  RDW 12.7 12.8 12.7  PLT 262 229 242   Thyroid No results for input(s): TSH, FREET4 in the last 168 hours.  BNPNo results for input(s): BNP, PROBNP in the last 168 hours.  DDimer No results for input(s): DDIMER in the last 168 hours.   Radiology    DG Chest 2 View  Result Date: 01/30/2021 CLINICAL DATA:  Chest pain EXAM: CHEST - 2 VIEW COMPARISON:  Chest x-ray dated April 07, 2015 FINDINGS: The heart size and mediastinal contours are within normal limits. Both lungs are clear. The visualized skeletal structures are unremarkable. IMPRESSION: No active cardiopulmonary disease. Electronically Signed   By: Yetta Glassman M.D.   On: 01/30/2021 08:11   CARDIAC CATHETERIZATION  Addendum Date: 01/30/2021   CONCLUSIONS: 95% mid LAD.  First diagonal 70 to 80% ostial narrowing. Successful LAD stent with  reduction in stenosis to 0% with TIMI grade III flow. Left main widely patent Circumflex with luminal irregularities Right coronary with eccentric 70% proximal to mid stenosis and tandem 40% mid stenosis. Normal LV function.  LVEDP 8 mmHg.  EF 55%. RECOMMENDATIONS: Aggressive lipid-lowering.  LDL target less than 70 and preferably 55 or less. Smoking and cocaine cessation. Blood pressure and glycemic control. Cardiac rehab if possible  Result Date: 01/30/2021 CONCLUSIONS: 95% mid LAD.  First diagonal 70 to 80% ostial narrowing. Successful LAD stent with reduction in stenosis to 0% with TIMI grade III flow. Left main widely patent Circumflex with luminal irregularities Right coronary with eccentric 70% proximal to mid stenosis and tandem 40% mid stenosis. Normal LV function.  LVEDP 8 mmHg.  EF 55%. RECOMMENDATIONS: Aggressive lipid-lowering.  LDL target less than 70 and preferably 55 or less. Smoking and cocaine cessation. Blood pressure and glycemic control. Cardiac rehab if possible   CT Angio Chest Aorta W and/or Wo Contrast  Result Date: 01/30/2021 CLINICAL DATA:  Acute aortic syndrome with chest pain radiating to the back. EXAM: CT ANGIOGRAPHY CHEST WITH CONTRAST TECHNIQUE: Multidetector CT imaging of the chest was performed using the standard protocol during bolus administration of intravenous contrast. Multiplanar CT image reconstructions and MIPs were obtained to evaluate the vascular anatomy. RADIATION DOSE REDUCTION: This exam was performed according to the departmental dose-optimization program which includes automated exposure control, adjustment of the mA and/or kV according to patient size and/or use of iterative reconstruction technique. CONTRAST:  70mL OMNIPAQUE IOHEXOL 350 MG/ML SOLN COMPARISON:  None. FINDINGS: Cardiovascular: 2 vessel aortic arch. The right brachiocephalic and left  common carotid artery share a common origin. Mild atherosclerotic plaque in the aortic arch and proximal arch  vessels. No evidence of aneurysm or dissection. The main pulmonary artery is normal in size. No evidence of pulmonary embolism. Scattered calcifications visualized along the left anterior descending and circumflex coronary arteries. The heart is normal in size. No pericardial effusion. Mediastinum/Nodes: Unremarkable CT appearance of the thyroid gland. No suspicious mediastinal or hilar adenopathy. No soft tissue mediastinal mass. The thoracic esophagus is unremarkable. Lungs/Pleura: Lungs are clear. No pleural effusion or pneumothorax. Upper Abdomen: No acute abnormality. Numerous calcified stones visualized within the gallbladder lumen. Musculoskeletal: No chest wall abnormality. No acute or significant osseous findings. Review of the MIP images confirms the above findings. IMPRESSION: 1. Negative for aortic dissection, aneurysm or other acute vascular abnormality. 2. Atherosclerotic calcifications are visualized along the aorta and coronary arteries. 3. Cholelithiasis noted incidentally. Aortic Atherosclerosis (ICD10-I70.0). Electronically Signed   By: Jacqulynn Cadet M.D.   On: 01/30/2021 09:39    Cardiac Studies   LHC 01/30/21: CONCLUSIONS: 95% mid LAD.  First diagonal 70 to 80% ostial narrowing. Successful LAD stent with reduction in stenosis to 0% with TIMI grade III flow. Left main widely patent Circumflex with luminal irregularities Right coronary with eccentric 70% proximal to mid stenosis and tandem 40% mid stenosis. Normal LV function.  LVEDP 8 mmHg.  EF 55%.   RECOMMENDATIONS:   Aggressive lipid-lowering.  LDL target less than 70 and preferably 55 or less. Smoking and cocaine cessation. Blood pressure and glycemic control. Cardiac rehab if possible    Patient Profile     61 y.o. male with history of PAD s/p SFA stenting, HIV, HTN, HLD, OSA, and current smoker who presented to [facility] ER with chest pain found to have trop 34>30 with cath 01/30/21 demonstrating 95% mid-LAD now  s/p PCI with excellent angiographic results.  Assessment & Plan    #Coronary Artery Disease: #Chest Pain: Patient presented to [facility] ER with chest pain responsive to nitro found to have mildly elevated troponin. Given symptoms and risk factors, patient underwent coronary angiography on 01/30/21 found to have 95% mid-LAD disease; 70-80% D1, 70% prox-mid RCA stenosis and tandem 40% mid RCA stenosis. Now s/p PCI to mid-LAD with excellent angiographic results.  -S/p PCI to mLAD with excellent angiographic results -Continue ASA 81mg  daily, plavix 75mg  daily -Continue coreg 3.125mg  BID -Continue crestor 40mg  daily -Restart lisinopril 20mg  daily -Tobacco and cocaine cessation -Cardiac Rehab  #HTN: -Continue coreg 3.125mg  BID -Continue lisinopril 20mg  daily  #HLD: Goal LDL<55. -Changed from pravastatin to crestor 20mg  daily -Repeat lipid panel in 6-8 weeks  #PAD: S/p atherectomy and stenting of the left SFA/popliteal in 2018. -Continue ASA 81mg  daily and plavix 75mg  daily -Continue crestor as above -Tobacco and cocaine cessation  #HIV: -Continue home meds  #Tobacco Use, Cocaine use: -Encourage cessation  For questions or updates, please contact [facility] Please consult [email or web address] for contact info under        Signed, [name], MD  01/31/2021, 7:43 AM

## 2021-01-31 NOTE — Plan of Care (Signed)
°  Problem: Education: Goal: Knowledge of General Education information will improve Description: Including pain rating scale, medication(s)/side effects and non-pharmacologic comfort measures Outcome: Progressing   Problem: Health Behavior/Discharge Planning: Goal: Ability to manage health-related needs will improve Outcome: Progressing   Problem: Clinical Measurements: Goal: Ability to maintain clinical measurements within normal limits will improve Outcome: Progressing Goal: Diagnostic test results will improve Outcome: Progressing   Problem: Skin Integrity: Goal: Risk for impaired skin integrity will decrease Outcome: Progressing

## 2021-01-31 NOTE — Progress Notes (Signed)
CARDIAC REHAB PHASE I   PRE:  Rate/Rhythm: 80 SR  BP:  Sitting: 153/75      SaO2: 95 RA  MODE:  Ambulation: 800 ft   POST:  Rate/Rhythm: 94 SR  BP:  Sitting: 139/68    SaO2: 98 RA   Pt ambulated 826ft in hallway independently with steady gait. Pt denies CP, SOB, or dizziness. Pt returned to bed. Pt educated on importance of ASA, Plavix, and statin. Pt given heart healthy diet and smoking cessation tip sheet. Reviewed site care, restrictions, and exercise guidelines. Will refer to CRP II [place] Rufina Falco, RN BSN 01/31/2021 9:41 AM

## 2021-01-31 NOTE — Discharge Summary (Addendum)
Discharge Summary    Patient ID: Jacob Ali MRN: [id number]; DOB: [birth year]  Admit date: 01/30/2021 Discharge date: 01/31/2021  PCP:  Pleas Koch, NP   [facility] Providers Cardiologist:  [name], MD        Discharge Diagnoses    Principal Problem:   Unstable angina [facility]) Active Problems:   HIV (human immunodeficiency virus infection) ([place])   Essential (primary) hypertension   Hyperlipidemia   Tobacco use disorder   Atherosclerosis with claudication of extremity ([place])   Type 2 diabetes mellitus ([place])   Subdural hematoma   Chest pain   CAD in native artery   S/P angioplasty with stent 01/30/21 to LAD     Diagnostic Studies/Procedures    Cardiac cath 01/30/21 CONCLUSIONS: 95% mid LAD.  First diagonal 70 to 80% ostial narrowing. Successful LAD stent with reduction in stenosis to 0% with TIMI grade III flow. Left main widely patent Circumflex with luminal irregularities Right coronary with eccentric 70% proximal to mid stenosis and tandem 40% mid stenosis. Normal LV function.  LVEDP 8 mmHg.  EF 55%.   RECOMMENDATIONS:   Aggressive lipid-lowering.  LDL target less than 70 and preferably 55 or less. Smoking and cocaine cessation. Blood pressure and glycemic control. Cardiac rehab if possible    Diagnostic Dominance: Right Intervention   _____________    TTE 01/31/21  Pending  but quick review by MD with normal EF and mild MR   History of Present Illness     Jacob Ali is a 61 y.o. male with hx of PAD with atherectomy and stenting of left SFA and popliteal arteries with VVS in 2018, IBS as well, sleep apnea.   No prior cardiac hx and [facility] 01/30/21.  Pain began 2 weeks ago, sharp in center of chest and radiates to both shoulders, arm, and back  + nausea but no vomiting, no diaphoresis.   5 days prior to admit he used cocaine and he smokes tobacco daily.   He also has hx of premature CAD,  with uncle in his 21s and both parents with CHF.    EKG with SR and HR 94, mild ST depression inf and lateral leads new from 2016.  Hs troponin 34 and 30 .  Chest CTA for dissection was neg.  + coronary atherosclerosis.  Plans to admit and have cardiac cath for unstable angina.     Hospital Course     Consultants: none   Troponin pk was admit at 34 today 31.  Hgb stable along with Cr and K+.  Prior A1c was 5.7  his HIV status followed by Dr. Johnnye Sima.   Cardiac cath as above with stenosis of LAD undergoing PCI with DES.  Plan for DAPT for 1 year with ASA and plavix, stop tobacco and cocaine.  Crestor for statin, and resume his lisinopril for HTN.  We also added BB coreg.  He will need TOC appt    for PAD follow with vascular and HIV per Dr. Johnnye Sima.   Cardiac rehab did visit with pt today  Echo done but not read formally -Dr. Harrington Challenger reviewed and normal EF and mild MR, formal review to follow.  Pt made aware of results.  He has been seen and evaluated by Dr. Johney Frame and found stable for discharge.    Did the patient have an acute coronary syndrome (MI, NSTEMI, STEMI, etc) this admission?:  No  Did the patient have a percutaneous coronary intervention (stent / angioplasty)?:  Yes.     Cath/PCI Registry Performance & Quality Measures: Aspirin prescribed? - Yes ADP Receptor Inhibitor (Plavix/Clopidogrel, Brilinta/Ticagrelor or Effient/Prasugrel) prescribed (includes medically managed patients)? - Yes High Intensity Statin (Lipitor 40-80mg  or Crestor 20-40mg ) prescribed? - Yes For EF <40%, was ACEI/ARB prescribed? - Not Applicable (EF >/= 78%) For EF <40%, Aldosterone Antagonist (Spironolactone or Eplerenone) prescribed? - Not Applicable (EF >/= 67%) Cardiac Rehab Phase II ordered? - Yes       The patient is scheduled for a TOC follow up appointment  A message has been sent to the [facility] Pool and Scheduling Pool at the office where the patient should be seen for  follow up.  _____________  Discharge Vitals Blood pressure (!) 147/75, pulse 73, temperature 98.2 F (36.8 C), temperature source Oral, resp. rate 17, height 5\' 9"  (1.753 m), weight 84 kg, SpO2 96 %.  Filed Weights   01/30/21 0707 01/31/21 0556  Weight: 86.2 kg 84 kg    Labs & Radiologic Studies    CBC Recent Labs    01/30/21 0710 01/30/21 1603 01/30/21 2040 01/31/21 0055  WBC 9.2   < > 7.8 8.9  NEUTROABS 5.0  --   --   --   HGB 14.3   < > 12.9* 13.3  HCT 42.0   < > 39.3 38.8*  MCV 98.6   < > 100.8* 100.8*  PLT 260   < > 229 242   < > = values in this interval not displayed.   Basic Metabolic Panel Recent Labs    01/30/21 0710 01/30/21 1603 01/30/21 2040 01/31/21 0055  NA 135  --   --  133*  K 4.1  --   --  3.9  CL 104  --   --  100  CO2 21*  --   --  23  GLUCOSE 225*  --   --  166*  BUN 22  --   --  18  CREATININE 0.93   < > 0.97 0.91  CALCIUM 9.5  --   --  9.0   < > = values in this interval not displayed.   Liver Function Tests Recent Labs    01/30/21 0924  AST 14*  ALT 16  ALKPHOS 48  BILITOT 0.4  PROT 7.0  ALBUMIN 4.5   Recent Labs    01/30/21 0924  LIPASE 23   High Sensitivity Troponin:   Recent Labs  Lab 01/30/21 0710 01/30/21 0924 01/31/21 0513  TROPONINIHS 34* 30* 31*    BNP Invalid input(s): POCBNP D-Dimer No results for input(s): DDIMER in the last 72 hours. Hemoglobin A1C No results for input(s): HGBA1C in the last 72 hours. Fasting Lipid Panel No results for input(s): CHOL, HDL, LDLCALC, TRIG, CHOLHDL, LDLDIRECT in the last 72 hours. Thyroid Function Tests No results for input(s): TSH, T4TOTAL, T3FREE, THYROIDAB in the last 72 hours.  Invalid input(s): FREET3 _____________  DG Chest 2 View  Result Date: 01/30/2021 CLINICAL DATA:  Chest pain EXAM: CHEST - 2 VIEW COMPARISON:  Chest x-ray dated April 07, 2015 FINDINGS: The heart size and mediastinal contours are within normal limits. Both lungs are clear. The visualized  skeletal structures are unremarkable. IMPRESSION: No active cardiopulmonary disease. Electronically Signed   By: Yetta Glassman M.D.   On: 01/30/2021 08:11   CARDIAC CATHETERIZATION  Addendum Date: 01/30/2021   CONCLUSIONS: 95% mid LAD.  First diagonal 70 to 80% ostial narrowing. Successful  LAD stent with reduction in stenosis to 0% with TIMI grade III flow. Left main widely patent Circumflex with luminal irregularities Right coronary with eccentric 70% proximal to mid stenosis and tandem 40% mid stenosis. Normal LV function.  LVEDP 8 mmHg.  EF 55%. RECOMMENDATIONS: Aggressive lipid-lowering.  LDL target less than 70 and preferably 55 or less. Smoking and cocaine cessation. Blood pressure and glycemic control. Cardiac rehab if possible  Result Date: 01/30/2021 CONCLUSIONS: 95% mid LAD.  First diagonal 70 to 80% ostial narrowing. Successful LAD stent with reduction in stenosis to 0% with TIMI grade III flow. Left main widely patent Circumflex with luminal irregularities Right coronary with eccentric 70% proximal to mid stenosis and tandem 40% mid stenosis. Normal LV function.  LVEDP 8 mmHg.  EF 55%. RECOMMENDATIONS: Aggressive lipid-lowering.  LDL target less than 70 and preferably 55 or less. Smoking and cocaine cessation. Blood pressure and glycemic control. Cardiac rehab if possible   CT Angio Chest Aorta W and/or Wo Contrast  Result Date: 01/30/2021 CLINICAL DATA:  Acute aortic syndrome with chest pain radiating to the back. EXAM: CT ANGIOGRAPHY CHEST WITH CONTRAST TECHNIQUE: Multidetector CT imaging of the chest was performed using the standard protocol during bolus administration of intravenous contrast. Multiplanar CT image reconstructions and MIPs were obtained to evaluate the vascular anatomy. RADIATION DOSE REDUCTION: This exam was performed according to the departmental dose-optimization program which includes automated exposure control, adjustment of the mA and/or kV according to patient size  and/or use of iterative reconstruction technique. CONTRAST:  79mL OMNIPAQUE IOHEXOL 350 MG/ML SOLN COMPARISON:  None. FINDINGS: Cardiovascular: 2 vessel aortic arch. The right brachiocephalic and left common carotid artery share a common origin. Mild atherosclerotic plaque in the aortic arch and proximal arch vessels. No evidence of aneurysm or dissection. The main pulmonary artery is normal in size. No evidence of pulmonary embolism. Scattered calcifications visualized along the left anterior descending and circumflex coronary arteries. The heart is normal in size. No pericardial effusion. Mediastinum/Nodes: Unremarkable CT appearance of the thyroid gland. No suspicious mediastinal or hilar adenopathy. No soft tissue mediastinal mass. The thoracic esophagus is unremarkable. Lungs/Pleura: Lungs are clear. No pleural effusion or pneumothorax. Upper Abdomen: No acute abnormality. Numerous calcified stones visualized within the gallbladder lumen. Musculoskeletal: No chest wall abnormality. No acute or significant osseous findings. Review of the MIP images confirms the above findings. IMPRESSION: 1. Negative for aortic dissection, aneurysm or other acute vascular abnormality. 2. Atherosclerotic calcifications are visualized along the aorta and coronary arteries. 3. Cholelithiasis noted incidentally. Aortic Atherosclerosis (ICD10-I70.0). Electronically Signed   By: Jacqulynn Cadet M.D.   On: 01/30/2021 09:39   Disposition   Pt is being discharged home today in good condition.  Follow-up Plans & Appointments   Take 1 NTG, under your tongue, while sitting.  If no relief of pain may repeat NTG, one tab every 5 minutes up to 3 tablets total over 15 minutes.  If no relief CALL 911.  If you have dizziness/lightheadness  while taking NTG, stop taking and call 911.        Call [facility] at [phone] if any bleeding, swelling or drainage at cath site.  May shower, no tub baths for 48 hours  for groin sticks. No lifting over 5 pounds for 3 days.  No Driving for 3 days   Do not stop asprin and plavix, these medications keep your stent open, stopping the meds could cause a heart attack.  Please stop with tobacco  or any meds not prescribed.    We changed your Pravachol to Crestor and adjusted your lisinopril to plain lisinopril and we added carvedilol   Follow-up Information     [name], MD Follow up on 02/21/2021.   Specialties: Cardiology, Radiology Why: at 12:15 pm with her PA [facility] information: [address] [place] [address] [phone]                Discharge Instructions     Amb Referral to Cardiac Rehabilitation   Complete by: As directed    Diagnosis: Coronary Stents   After initial evaluation and assessments completed: Virtual Based Care may be provided alone or in conjunction with Phase 2 Cardiac Rehab based on patient barriers.: Yes       Discharge Medications   Allergies as of 01/31/2021       Reactions   Atorvastatin Other (See Comments)   myalgias   Tamiflu [oseltamivir Phosphate] Other (See Comments)   Depressive symptoms        Medication List     STOP taking these medications    ibuprofen 800 MG tablet Commonly known as: ADVIL   lisinopril-hydrochlorothiazide 20-25 MG tablet Commonly known as: ZESTORETIC   pravastatin 40 MG tablet Commonly known as: PRAVACHOL       TAKE these medications    Accu-Chek Guide test strip Generic drug: glucose blood USE UP TO 4 TIMES A DAY AS DIRECTED   Accu-Chek Softclix Lancets lancets USE UP TO 4 TIMES A DAY AS DIRECTED   acetaminophen 325 MG tablet Commonly known as: TYLENOL Take 2 tablets (650 mg total) by mouth every 4 (four) hours as needed for headache or mild pain.   aspirin 81 MG chewable tablet Chew 1 tablet (81 mg total) by mouth daily. Start taking on: February 01, 2021   Biktarvy 50-200-25 MG Tabs tablet Generic drug:  bictegravir-emtricitabine-tenofovir AF TAKE 1 TABLET BY MOUTH DAILY.   carvedilol 3.125 MG tablet Commonly known as: COREG Take 1 tablet (3.125 mg total) by mouth 2 (two) times daily with a meal.   clopidogrel 75 MG tablet Commonly known as: PLAVIX TAKE 1 TABLET BY MOUTH EVERY DAY   Fish Oil 1000 MG Caps Take by mouth.   lisinopril 20 MG tablet Commonly known as: ZESTRIL Take 1 tablet (20 mg total) by mouth daily. Start taking on: February 01, 2021   nitroGLYCERIN 0.4 MG SL tablet Commonly known as: NITROSTAT Place 1 tablet (0.4 mg total) under the tongue every 5 (five) minutes x 3 doses as needed for chest pain.   pantoprazole 40 MG tablet Commonly known as: PROTONIX Take 1 tablet (40 mg total) by mouth daily. For heartburn.   rosuvastatin 40 MG tablet Commonly known as: CRESTOR Take 1 tablet (40 mg total) by mouth daily. Start taking on: February 01, 2021   traZODone 50 MG tablet Commonly known as: DESYREL Take 1 tablet (50 mg total) by mouth at bedtime as needed for sleep.           Outstanding Labs/Studies   Lipids and hepatic in 6 weeks.    Duration of Discharge Encounter   Greater than 30 minutes including physician time.  Signed, Cecilie Kicks, NP 01/31/2021, 3:34 PM  Patient seen and examined and agree with Cecilie Kicks, NP as detailed above. Please see progress note from today for full details.  Gwyndolyn Kaufman, MD

## 2021-01-31 NOTE — Telephone Encounter (Signed)
**Note De-Identified Jacob Ali Obfuscation** The pt is currently in [facility]. Will call once discharged.

## 2021-01-31 NOTE — Progress Notes (Signed)
°  Echocardiogram 2D Echocardiogram has been performed.  Johny Chess 01/31/2021, 2:00 PM

## 2021-01-31 NOTE — Telephone Encounter (Signed)
**Note De-identified Erla Bacchi Obfuscation** -----  **Note De-Identified Naftula Donahue Obfuscation** Message from Isaiah Serge, NP sent at 01/31/2021 10:59 AM EST ----- Pt for unstable angina and new stent.   Appt is 02/21/21

## 2021-02-01 ENCOUNTER — Encounter: Payer: Self-pay | Admitting: Infectious Diseases

## 2021-02-02 NOTE — Telephone Encounter (Signed)
**Note De-Identified [name] Obfuscation** Patient contacted regarding discharge from [facility] on 01/31/2021.  Patient understands to follow up with provider Ermalinda Barrios, PA-c on 02/21/2021 at 12:15 at [address], [place] in [place], [place] [address]. Patient understands discharge instructions? Yes Patient understands medications and regiment? Yes Patient understands to bring all medications to this visit? Yes  Ask patient:  Are you enrolled in My Chart: Yes  The pt reports that he has been doing well since returning home from the hospital on Wednesday. He denies CP/discomfort, SOB, nausea, diaphoresis, fatigue, or weakness. He states that he has dizziness but that is due to a head trama from years ago and that he has no worsening dizziness at this time.  He does have [facility]'s phone number to call if he has any questions or concerns. He thanked me for calling him.

## 2021-02-07 ENCOUNTER — Encounter: Payer: Self-pay | Admitting: Infectious Diseases

## 2021-02-07 ENCOUNTER — Other Ambulatory Visit: Payer: Self-pay | Admitting: Infectious Diseases

## 2021-02-07 ENCOUNTER — Telehealth: Payer: Self-pay | Admitting: Registered Nurse

## 2021-02-07 ENCOUNTER — Encounter: Payer: Self-pay | Admitting: Registered Nurse

## 2021-02-07 DIAGNOSIS — G4700 Insomnia, unspecified: Secondary | ICD-10-CM

## 2021-02-07 DIAGNOSIS — Z95820 Peripheral vascular angioplasty status with implants and grafts: Secondary | ICD-10-CM

## 2021-02-07 MED ORDER — TEMAZEPAM 7.5 MG PO CAPS
7.5000 mg | ORAL_CAPSULE | Freq: Every evening | ORAL | 0 refills | Status: DC | PRN
Start: 2021-02-07 — End: 2021-02-08

## 2021-02-07 NOTE — Telephone Encounter (Signed)
Spoke with patient in workcenter.  First day back at work.  Feeling well improved energy since stent placement 01/30/21 and overnight hospitalization.  Patient thought fatigue was related to caring for ailing mother/stress but now feels like related to clogged heart artery.  Able to walk his dog without difficulty.  Encouraged patient to take his medications as prescribed.  Activity as tolerated.  Avoid dehydration/eat regular meals.  Follow up scheduled with [facility] and cardiology.  Patient has notified his providers of hospitalization with my chart.  Continuing his long term work restrictions no lifting greater than 10 lbs dated 03/13/20 Dr Johnnye Sima for lifetime condition no expiration date.  Patient A&Ox3 spoke full sentences without difficulty respirations even and unlabored skin warm dry and pink.  No audible cough/congestion/throat clearing.  Patient verbalized understanding information/instructions, agreed with plan of care and had no further questions at this time.  Per Epic review/discharge summary: Cardiac cath 01/30/21 CONCLUSIONS: 95% mid LAD.  First diagonal 70 to 80% ostial narrowing. Successful LAD stent with reduction in stenosis to 0% with TIMI grade III flow. Left main widely patent Circumflex with luminal irregularities Right coronary with eccentric 70% proximal to mid stenosis and tandem 40% mid stenosis. Normal LV function.  LVEDP 8 mmHg.  EF 55%.   RECOMMENDATIONS:   Aggressive lipid-lowering.  LDL target less than 70 and preferably 55 or less. Smoking and cocaine cessation. Blood pressure and glycemic control. Cardiac rehab if possible  Cardiac cath as above with stenosis of LAD undergoing PCI with DES.  Plan for DAPT for 1 year with ASA and plavix, stop tobacco and cocaine.  Crestor for statin, and resume his lisinopril for HTN.  We also added BB coreg.  He will need TOC appt    for PAD follow with vascular and HIV per Dr. Johnnye Sima.   Take 1 NTG, under your tongue, while sitting.   If no relief of pain may repeat NTG, one tab every 5 minutes up to 3 tablets total over 15 minutes.  If no relief CALL 911.  If you have dizziness/lightheadness  while taking NTG, stop taking and call 911.         Call [facility] at [phone] if any bleeding, swelling or drainage at cath site.  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 No lifting over 5 pounds for 3 days.  No Driving for 3 days    Do not stop asprin and plavix, these medications keep your stent open, stopping the meds could cause a heart attack.   Please stop with tobacco or any meds not prescribed.     We changed your Pravachol to Crestor and adjusted your lisinopril to plain lisinopril and we added carvedilol    Follow-up Information       [name], MD Follow up on 02/21/2021.   Specialties: Cardiology, Radiology Why: at 12:15 pm with her PA [facility] information: [address] [address] [phone]        [facility] [place], [place],   [address] Phone:  [phone]   Fax:  [phone]   March 13, 2020    Patient: Jacob Ali  Date of Birth: 10-19-1960  Date of Visit: 03/13/2020      To Whom It May Concern:   It is my medical opinion that Jacob Ali no prolonged standing or lifting more than 10 pounds repetitively. There is  no expiration date on these limitations as his condition is a lifetime one.   If you have any questions or concerns, please don't hesitate to call.   Sincerely,       Campbell Riches, MD    [facility]                               [place], [phone]

## 2021-02-07 NOTE — Telephone Encounter (Signed)
Reviewed Epic.  Left message for patient lisinopril 20mg  carried in clinic and can be dispensed from [facility] and that his lisinopril hydrochlorothiazide stopped.  Reminder stop pravachol and start crestor.  Crestor not available from [facility] and will need to filled at local pharmacy. Ensure patient taking new Rx Coreg also. Patient to follow up for BP check with RN Hildred Alamin this week.

## 2021-02-08 ENCOUNTER — Other Ambulatory Visit: Payer: Self-pay | Admitting: Infectious Diseases

## 2021-02-08 ENCOUNTER — Other Ambulatory Visit (HOSPITAL_COMMUNITY): Payer: Self-pay

## 2021-02-08 DIAGNOSIS — G4700 Insomnia, unspecified: Secondary | ICD-10-CM

## 2021-02-08 MED ORDER — TEMAZEPAM 7.5 MG PO CAPS
7.5000 mg | ORAL_CAPSULE | Freq: Every evening | ORAL | 0 refills | Status: DC | PRN
  Filled 2021-02-08: qty 30, 30d supply, fill #0

## 2021-02-08 MED ORDER — TEMAZEPAM 7.5 MG PO CAPS: 7.5000 mg | ORAL_CAPSULE | Freq: Every evening | ORAL | 0 refills | Status: DC | PRN

## 2021-02-08 NOTE — Telephone Encounter (Signed)
RN Hildred Alamin saw patient today.  Patient decided to continue picking up his medications all at [facility] and not utilizing [facility] at [facility] for those on formulary.  Patient has been checking his blood pressure at home.  Clinic BP not performed today.

## 2021-02-09 ENCOUNTER — Telehealth (HOSPITAL_COMMUNITY): Payer: Self-pay

## 2021-02-09 ENCOUNTER — Other Ambulatory Visit: Payer: Self-pay

## 2021-02-09 ENCOUNTER — Other Ambulatory Visit (HOSPITAL_COMMUNITY): Payer: Self-pay

## 2021-02-09 ENCOUNTER — Other Ambulatory Visit: Payer: Self-pay | Admitting: Infectious Diseases

## 2021-02-09 DIAGNOSIS — G4700 Insomnia, unspecified: Secondary | ICD-10-CM

## 2021-02-09 DIAGNOSIS — B20 Human immunodeficiency virus [HIV] disease: Secondary | ICD-10-CM

## 2021-02-09 MED ORDER — TEMAZEPAM 7.5 MG PO CAPS: 7.5000 mg | ORAL_CAPSULE | Freq: Every evening | ORAL | 0 refills | Status: DC | PRN

## 2021-02-09 MED ORDER — BIKTARVY 50-200-25 MG PO TABS
1.0000 | ORAL_TABLET | Freq: Every day | ORAL | 6 refills | Status: DC
  Filled 2021-02-09: qty 30, 30d supply, fill #0
  Filled 2021-03-23: qty 30, 30d supply, fill #1
  Filled 2021-04-12: qty 30, 30d supply, fill #2
  Filled 2021-05-18: qty 30, 30d supply, fill #3
  Filled 2021-06-18: qty 30, 30d supply, fill #4
  Filled 2021-07-16: qty 30, 30d supply, fill #5
  Filled 2021-08-07: qty 30, 30d supply, fill #6

## 2021-02-09 NOTE — Telephone Encounter (Signed)
Attempted to call patient in regards to Cardiac Rehab - LM on VM 

## 2021-02-09 NOTE — Progress Notes (Signed)
Left voicemail with rx information. Requested call back to confirm.  Leatrice Jewels, RMA

## 2021-02-09 NOTE — Addendum Note (Signed)
Addended by: Eugenia Mcalpine on: 02/09/2021 07:35 AM   Modules accepted: Orders

## 2021-02-09 NOTE — Addendum Note (Signed)
Addended by: Eugenia Mcalpine on: 02/09/2021 07:36 AM   Modules accepted: Orders

## 2021-02-09 NOTE — Progress Notes (Signed)
[facility]. Jacob Ali

## 2021-02-10 ENCOUNTER — Other Ambulatory Visit (HOSPITAL_COMMUNITY): Payer: Self-pay

## 2021-02-13 ENCOUNTER — Other Ambulatory Visit (HOSPITAL_COMMUNITY): Payer: Self-pay

## 2021-02-15 ENCOUNTER — Other Ambulatory Visit (HOSPITAL_COMMUNITY): Payer: Self-pay

## 2021-02-17 NOTE — Telephone Encounter (Signed)
Per Epic cardiology follow up appt rescheduled to 04/04/21.  Will have RN Hildred Alamin follow up with patient for BP recheck this week.

## 2021-02-20 NOTE — Telephone Encounter (Signed)
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

## 2021-02-21 ENCOUNTER — Ambulatory Visit: Payer: No Typology Code available for payment source | Admitting: Physician Assistant

## 2021-02-26 ENCOUNTER — Other Ambulatory Visit (HOSPITAL_COMMUNITY): Payer: Self-pay

## 2021-03-01 NOTE — Telephone Encounter (Signed)
Per Epic cardiology appt now 03/30/21

## 2021-03-02 ENCOUNTER — Other Ambulatory Visit: Payer: Self-pay

## 2021-03-02 ENCOUNTER — Ambulatory Visit (INDEPENDENT_AMBULATORY_CARE_PROVIDER_SITE_OTHER): Payer: No Typology Code available for payment source | Admitting: Physician Assistant

## 2021-03-02 ENCOUNTER — Encounter: Payer: Self-pay | Admitting: Physician Assistant

## 2021-03-02 VITALS — BP 158/62 | HR 80 | Ht 69.0 in | Wt 185.0 lb

## 2021-03-02 DIAGNOSIS — I70219 Atherosclerosis of native arteries of extremities with intermittent claudication, unspecified extremity: Secondary | ICD-10-CM

## 2021-03-02 DIAGNOSIS — F17200 Nicotine dependence, unspecified, uncomplicated: Secondary | ICD-10-CM

## 2021-03-02 DIAGNOSIS — I10 Essential (primary) hypertension: Secondary | ICD-10-CM

## 2021-03-02 DIAGNOSIS — I2510 Atherosclerotic heart disease of native coronary artery without angina pectoris: Secondary | ICD-10-CM

## 2021-03-02 DIAGNOSIS — E782 Mixed hyperlipidemia: Secondary | ICD-10-CM | POA: Diagnosis not present

## 2021-03-02 DIAGNOSIS — F1491 Cocaine use, unspecified, in remission: Secondary | ICD-10-CM | POA: Insufficient documentation

## 2021-03-02 MED ORDER — CARVEDILOL 6.25 MG PO TABS: 6.2500 mg | ORAL_TABLET | Freq: Two times a day (BID) | ORAL | 2 refills | Status: DC

## 2021-03-02 NOTE — Progress Notes (Signed)
Cardiology Office Note:    Date:  03/02/2021   ID:  Jacob Ali, DOB 1960-11-11, MRN [id number]  PCP:  Pleas Koch, NP  [facility] Providers Cardiologist:  [name], MD    Referring MD: Pleas Koch, NP   Chief Complaint:  Hospitalization Follow-up (Unstable angina, s/p PCI)    Patient Profile: Coronary artery disease [place] 1/23 s/p DES to LAD Cath 1/23 w residual Dx and RCA disease managed medically HIV Hypertension  Hyperlipidemia  Diabetes mellitus  Peripheral arterial disease  S/p atherectomy and stenting of the L SFA and popliteal arteries in 2018 Dr. Delana Meyer ([facility]) +Cigs Sleep apnea Hx of cocaine use  Prior CV Studies: Echocardiogram 01/31/2021 EF 60-65, no RWMA, GR 1 DD, normal RVSF, normal PASP, trivial MR  Cardiac catheterization 01/30/2021 LAD mid 95, 50; D1 80, D2 70 RCA proximal 70 EF 55-65, no RWMA PCI: 2.75 x 15 mm Onyx for anterior DES to the LAD    History of Present Illness:   Jacob Ali is a 61 y.o. male with the above problem list.  He was admitted 1/17-1/18 with unstable angina pectoris.  He had minimally elevated troponins without significant trend (34-30-31).  Cardiac catheterization demonstrated 95% mid LAD stenosis which was treated with a DES.  He has residual 80% stenosis in D1 and 70% stenosis in D2 as well as 70% proximal RCA stenosis.  Echocardiogram demonstrated normal LV function.  Post PCI course was uneventful.  He returns for follow-up.  He is here alone.  He had 1 episode of chest discomfort associated with emotional stress after discharge.  He took nitroglycerin with relief.  He has not had any further chest pain.  He has not had significant shortness of breath.  He has not had syncope, edema, orthopnea.  He brings in a list of his blood pressures.  Most of his readings at home have been significantly elevated.  His blood sugars have also started to increase.  He has a lot of issues with claudication.   At one point, his atorvastatin was stopped due to myalgias.  He cannot tell a big difference since he was placed on rosuvastatin in the hospital.    Past Medical History:  Diagnosis Date   CAD in native artery 01/31/2021   Depression    GERD (gastroesophageal reflux disease)    HIV infection (HCC)    Hyperlipidemia    Hypertension    Peripheral vascular disease (HCC)    PONV (postoperative nausea and vomiting)    S/P angioplasty with stent 01/30/21 to LAD  01/31/2021   Sleep apnea    Subdural hemorrhage (HCC)    Tubular adenoma of colon 11/03/2017   Colonoscopy Sept 9, 2019   Current Medications: Current Meds  Medication Sig   ACCU-CHEK GUIDE test strip USE UP TO 4 TIMES A DAY AS DIRECTED   Accu-Chek Softclix Lancets lancets USE UP TO 4 TIMES A DAY AS DIRECTED   acetaminophen (TYLENOL) 325 MG tablet Take 2 tablets (650 mg total) by mouth every 4 (four) hours as needed for headache or mild pain.   aspirin 81 MG chewable tablet Chew 1 tablet (81 mg total) by mouth daily.   bictegravir-emtricitabine-tenofovir AF (BIKTARVY) 50-200-25 MG TABS tablet TAKE 1 TABLET BY MOUTH DAILY.   clopidogrel (PLAVIX) 75 MG tablet TAKE 1 TABLET BY MOUTH EVERY DAY   lisinopril (ZESTRIL) 20 MG tablet Take 1 tablet (20 mg total) by mouth daily.   nitroGLYCERIN (NITROSTAT) 0.4 MG  SL tablet Place 1 tablet (0.4 mg total) under the tongue every 5 (five) minutes x 3 doses as needed for chest pain.   Omega-3 Fatty Acids (FISH OIL) 1000 MG CAPS Take by mouth.   pantoprazole (PROTONIX) 40 MG tablet Take 1 tablet (40 mg total) by mouth daily. For heartburn.   rosuvastatin (CRESTOR) 40 MG tablet Take 1 tablet (40 mg total) by mouth daily.   temazepam (RESTORIL) 7.5 MG capsule Take 1 capsule (7.5 mg total) by mouth at bedtime as needed for sleep.   [DISCONTINUED] carvedilol (COREG) 3.125 MG tablet Take 1 tablet (3.125 mg total) by mouth 2 (two) times daily with a meal.    Allergies:   Atorvastatin and Tamiflu  [oseltamivir phosphate]   Social History   Tobacco Use   Smoking status: Every Day    Packs/day: 0.50    Years: 40.00    Pack years: 20.00    Types: Cigars, Cigarettes   Smokeless tobacco: Never   Tobacco comments:    1/2PPD  Substance Use Topics   Alcohol use: Yes    Alcohol/week: 3.0 standard drinks    Types: 3 Standard drinks or equivalent per week    Comment: 2-3 times a week   Drug use: Yes    Frequency: 2.0 times per week    Types: Marijuana    Family Hx: The patient's family history includes Cancer in his father and maternal grandmother; Heart disease in his father; Throat cancer in his father.  Review of Systems  Cardiovascular:  Positive for claudication.    EKGs/Labs/Other Test Reviewed:    EKG:  EKG is  ordered today.  The ekg ordered today demonstrates NSR, HR 72, normal axis, inferior lateral T wave inversions, QTc 402, similar to prior tracings  Recent Labs: 06/26/2020: TSH 1.570 01/30/2021: ALT 16 01/31/2021: BUN 18; Creatinine, Ser 0.91; Hemoglobin 13.3; Platelets 242; Potassium 3.9; Sodium 133   Recent Lipid Panel Recent Labs    06/26/20 0935 09/13/20 0928 01/24/21 0954  CHOL 140   < > 169  TRIG 138   < > 288.0*  HDL 29*   < > 29.00*  VLDL  --    < > 57.6*  LDLCALC 86  --   --   LDLDIRECT  --    < > 98.0   < > = values in this interval not displayed.     Risk Assessment/Calculations:         Physical Exam:    VS:  BP (!) 158/62 (BP Location: Left Arm, Patient Position: Sitting, Cuff Size: Normal)    Pulse 80    Ht 5' 9"  (1.753 m)    Wt 185 lb (83.9 kg)    SpO2 95%    BMI 27.32 kg/m     Wt Readings from Last 3 Encounters:  03/02/21 185 lb (83.9 kg)  01/31/21 185 lb 3 oz (84 kg)  01/25/21 190 lb (86.2 kg)    Constitutional:      Appearance: Healthy appearance. Not in distress.  Neck:     Vascular: No JVR. JVD normal.  Pulmonary:     Effort: Pulmonary effort is normal.     Breath sounds: No wheezing. No rales.  Cardiovascular:      Normal rate. Regular rhythm. Normal S1. Normal S2.      Murmurs: There is no murmur.     Comments: R wrist without hematoma Edema:    Peripheral edema absent.  Abdominal:     Palpations: Abdomen is soft.  Skin:    General: Skin is warm and dry.  Neurological:     General: No focal deficit present.     Mental Status: Alert and oriented to person, place and time.     Cranial Nerves: Cranial nerves are intact.        ASSESSMENT & PLAN:   CAD in native artery Patient was admitted in January with unstable angina and underwent DES to the LAD.  He does have residual disease in 2 diagonals and the RCA which will be managed medically.  He has had 1 episode of angina since he left the hospital.  He has not had any further anginal symptoms.  He may have recurrent symptoms in the future given his residual disease.  We could consider adding nitrates or calcium channel blocker.  He does not take PDE-5 inhibitors.  He was already on aspirin and Plavix prior to admission.  He is eager to start cardiac rehabilitation.  I think he is ready to start with rehabilitation and I have encouraged him to do so. Continue aspirin 81 mg daily, Plavix 85 mg daily, rosuvastatin 40 mg daily, carvedilol 3.125 mg twice daily Start cardiac rehabilitation Quit smoking Follow-up in 4 to 6 weeks.  Essential (primary) hypertension Blood pressure is uncontrolled.  He was on lisinopril/HCTZ.  This was changed to lisinopril alone.  Carvedilol is a new medication for him. Continue lisinopril 20 mg daily Increase carvedilol to 6.25 mg twice daily Monitor blood pressure over 2 weeks and send readings for review If blood pressure above target, change lisinopril back to lisinopril/HCTZ 20/25 mg daily Follow-up in 6 weeks  Hyperlipidemia He had difficulty tolerating atorvastatin.  He has a lot of claudication symptoms.  There has not really been any significant change in his leg symptoms since starting on rosuvastatin.  I have  asked him to notify us if he feels that he is having worsening symptoms related to rosuvastatin.  Continue rosuvastatin 40 mg daily.  Arrange fasting CMET, lipids and 6 to 8 weeks.  Atherosclerosis with claudication of extremity ([place]) He has fairly stable claudication symptoms.  Continue follow-up with vascular surgery in [employer] as planned.  Tobacco use disorder I have encouraged him to continue to try to quit.  History of cocaine use He notes that he does not use cocaine and does not plan to restart.     Cardiac Rehabilitation Eligibility Assessment  The patient is ready to start cardiac rehabilitation from a cardiac standpoint.         Dispo:  Return in about 6 weeks (around 04/13/2021) for Routine follow up in 6 weeks with Dr. Johney Frame. .   Medication Adjustments/Labs and Tests Ordered: Current medicines are reviewed at length with the patient today.  Concerns regarding medicines are outlined above.  Tests Ordered: Orders Placed This Encounter  Procedures   Comp Met (CMET)   Lipid Profile   EKG 12-Lead   Medication Changes: Meds ordered this encounter  Medications   carvedilol (COREG) 6.25 MG tablet    Sig: Take 1 tablet (6.25 mg total) by mouth 2 (two) times daily with a meal.    Dispense:  60 tablet    Refill:  2   Signed, Richardson Dopp, PA-C  03/02/2021 1:29 PM    [facility] [place], [place], [place]  [address] Phone: [phone]; Fax: [phone]

## 2021-03-02 NOTE — Telephone Encounter (Signed)
Patient had appt with cardiology today per Epic Review

## 2021-03-02 NOTE — Assessment & Plan Note (Signed)
Patient was admitted in January with unstable angina and underwent DES to the LAD.  He does have residual disease in 2 diagonals and the RCA which will be managed medically.  He has had 1 episode of angina since he left the hospital.  He has not had any further anginal symptoms.  He may have recurrent symptoms in the future given his residual disease.  We could consider adding nitrates or calcium channel blocker.  He does not take PDE-5 inhibitors.  He was already on aspirin and Plavix prior to admission.  He is eager to start cardiac rehabilitation.  I think he is ready to start with rehabilitation and I have encouraged him to do so.  Continue aspirin 81 mg daily, Plavix 85 mg daily, rosuvastatin 40 mg daily, carvedilol 3.125 mg twice daily  Start cardiac rehabilitation  Quit smoking  Follow-up in 4 to 6 weeks.

## 2021-03-02 NOTE — Assessment & Plan Note (Signed)
He notes that he does not use cocaine and does not plan to restart.

## 2021-03-02 NOTE — Assessment & Plan Note (Signed)
He had difficulty tolerating atorvastatin.  He has a lot of claudication symptoms.  There has not really been any significant change in his leg symptoms since starting on rosuvastatin.  I have asked him to notify us if he feels that he is having worsening symptoms related to rosuvastatin.  Continue rosuvastatin 40 mg daily.  Arrange fasting CMET, lipids and 6 to 8 weeks.

## 2021-03-02 NOTE — Assessment & Plan Note (Signed)
He has fairly stable claudication symptoms.  Continue follow-up with vascular surgery in [place] as planned.

## 2021-03-02 NOTE — Assessment & Plan Note (Signed)
Blood pressure is uncontrolled.  He was on lisinopril/HCTZ.  This was changed to lisinopril alone.  Carvedilol is a new medication for him.  Continue lisinopril 20 mg daily  Increase carvedilol to 6.25 mg twice daily  Monitor blood pressure over 2 weeks and send readings for review  If blood pressure above target, change lisinopril back to lisinopril/HCTZ 20/25 mg daily  Follow-up in 6 weeks

## 2021-03-02 NOTE — Assessment & Plan Note (Signed)
I have encouraged him to continue to try to quit.

## 2021-03-02 NOTE — Patient Instructions (Signed)
Medication Instructions:   INCREASE Coreg one (1) tablet by mouth ( 6.25 mg ) twice daily. Can you use your 3.125 mg tablet taking Two (2) tablets twice daily at the same time.   *If you need a refill on your cardiac medications before your next appointment, please call your pharmacy*   Lab Work:  Your physician recommends that you return for a FASTING lipid profile/CMET. You can come in on the day of your appointment anytime between 7:30-4:30 fasting from midnight the night before. April 7.     If you have labs (blood work) drawn today and your tests are completely normal, you will receive your results only by: Jacob Ali (if you have MyChart) OR A paper copy in the mail If you have any lab test that is abnormal or we need to change your treatment, we will call you to review the results.   Follow-Up: At [facility], you and your health needs are our priority.  As part of our continuing mission to provide you with exceptional heart care, we have created designated Provider Care Teams.  These Care Teams include your primary Cardiologist (physician) and Advanced Practice Providers (APPs -  Physician Assistants and Nurse Practitioners) who all work together to provide you with the care you need, when you need it.  We recommend signing up for the patient portal called "MyChart".  Sign up information is provided on this After Visit Summary.  MyChart is used to connect with patients for Virtual Visits (Telemedicine).  Patients are able to view lab/test results, encounter notes, upcoming appointments, etc.  Non-urgent messages can be sent to your provider as well.   To learn more about what you can do with MyChart, go to [email or web address].    Your next appointment:   6 week(s)  The format for your next appointment:   In Person  Provider:   [name], MD     Other Instructions  See your PCP as planned.   Please take your blood pressure every day X 2 weeks and  send in readings either call or mychart.   Blood Pressure Record Sheet To take your blood pressure, you will need a blood pressure machine. You can buy a blood pressure machine (blood pressure monitor) at your clinic, drug store, or online. When choosing one, consider: An automatic monitor that has an arm cuff. A cuff that wraps snugly around your upper arm. You should be able to fit only one finger between your arm and the cuff. A device that stores blood pressure reading results. Do not choose a monitor that measures your blood pressure from your wrist or finger. Follow your health care provider's instructions for how to take your blood pressure. To use this form: Get one reading in the morning (a.m.) before you take any medicines. Get one reading in the evening (p.m.) before supper. Take at least 2 readings with each blood pressure check. This makes sure the results are correct. Wait 1-2 minutes between measurements. Write down the results in the spaces on this form. Repeat this once a week, or as told by your health care provider. Make a follow-up appointment with your health care provider to discuss the results. Blood pressure log Date: _______________________ a.m. _____________________(1st reading) _____________________(2nd reading) p.m. _____________________(1st reading) _____________________(2nd reading) Date: _______________________ a.m. _____________________(1st reading) _____________________(2nd reading) p.m. _____________________(1st reading) _____________________(2nd reading) Date: _______________________ a.m. _____________________(1st reading) _____________________(2nd reading) p.m. _____________________(1st reading) _____________________(2nd reading) Date: _______________________ a.m. _____________________(1st reading) _____________________(2nd reading) p.m. _____________________(1st reading) _____________________(2nd reading)  Date: _______________________ a.m.  _____________________(1st reading) _____________________(2nd reading) p.m. _____________________(1st reading) _____________________(2nd reading) This information is not intended to replace advice given to you by your health care provider. Make sure you discuss any questions you have with your health care provider. Document Revised: 04/20/2019 Document Reviewed: 04/21/2019 Elsevier Patient Education  [employer].

## 2021-03-05 NOTE — Telephone Encounter (Signed)
Reviewed cardiology note and patient to keep home BP log and send in 2 weeks of readings and follow up in 4 to 6 weeks with cardiology.

## 2021-03-09 ENCOUNTER — Other Ambulatory Visit (HOSPITAL_COMMUNITY): Payer: Self-pay

## 2021-03-12 ENCOUNTER — Other Ambulatory Visit (HOSPITAL_COMMUNITY): Payer: Self-pay

## 2021-03-15 ENCOUNTER — Encounter: Payer: Self-pay | Admitting: Registered Nurse

## 2021-03-15 ENCOUNTER — Telehealth: Payer: Self-pay | Admitting: Registered Nurse

## 2021-03-15 NOTE — Telephone Encounter (Signed)
Patient seen in workcenter feeling well denied chest pain/concerns.  Following up with [facility] regarding gall bladder.  Would like to know how to contact [employer] case manager as she keeps leaving him messages.   He cannot remember CM name. Discussed with patient I do not have contact information for insurance case managers.  I will see if Tonya or RN Hildred Alamin has information and contact him if able to assist.  Patient verbalized understanding information/instructions, agreed with plan of care had no further questions at this time. ?

## 2021-03-16 NOTE — Telephone Encounter (Signed)
Reviewed RN Hildred Alamin note will notify patient. ?

## 2021-03-16 NOTE — Telephone Encounter (Signed)
Per HR Jacob Ali, disease management-type case managers are not reported to the employer group. Recommend pt contact [employer] directly as they should be able to provide this information.  ?

## 2021-03-19 ENCOUNTER — Other Ambulatory Visit (HOSPITAL_COMMUNITY): Payer: Self-pay

## 2021-03-19 MED ORDER — LISINOPRIL-HYDROCHLOROTHIAZIDE 20-25 MG PO TABS
1.0000 | ORAL_TABLET | Freq: Every day | ORAL | 3 refills | Status: DC
  Filled 2021-03-19: qty 90, 90d supply, fill #0

## 2021-03-19 NOTE — Telephone Encounter (Signed)
Pt sent in his BP readings as requested by Richardson Dopp for the last 2 weeks. Pt's PB remains severely elevated. Attempted to call patient to question symptoms and confirm that he has been taking all of his medications regularly, but got no answer. Will send MyChart message back to him since this seems to be a good contact method. Per Scott's note at last OV on 03/02/21: ? ?Blood pressure is uncontrolled.  He was on lisinopril/HCTZ.  This was changed to lisinopril alone.  Carvedilol is a new medication for him. ?Continue lisinopril 20 mg daily ?Increase carvedilol to 6.25 mg twice daily ?Monitor blood pressure over 2 weeks and send readings for review ?If blood pressure above target, change lisinopril back to lisinopril/HCTZ 20/25 mg daily ?Follow-up in 6 weeks ? ?I have changed the lisinopril back to zestoretic and sent RX into pharmacy on file. Will schedule pt for f/u visit in 6 weeks once response is received in [place], but likely with Dr Johney Frame since no availability for [facility] right now. ?

## 2021-03-20 ENCOUNTER — Other Ambulatory Visit: Payer: Self-pay | Admitting: Cardiology

## 2021-03-20 ENCOUNTER — Other Ambulatory Visit: Payer: Self-pay | Admitting: *Deleted

## 2021-03-20 DIAGNOSIS — Z79899 Other long term (current) drug therapy: Secondary | ICD-10-CM

## 2021-03-20 DIAGNOSIS — I10 Essential (primary) hypertension: Secondary | ICD-10-CM

## 2021-03-20 NOTE — Telephone Encounter (Signed)
Agree with changing Lisinopril to Lisinopril/HCTZ 20/25 mg once daily.   ?He will need a f/u BMET 1-2 weeks after changing his medications. ?PLAN: ?-Schedule BMET 1-2 weeks after his first dose of Lisinopril/HCTZ. ?Richardson Dopp, PA-C    ?03/20/2021 1:43 PM   ?

## 2021-03-21 ENCOUNTER — Other Ambulatory Visit (HOSPITAL_COMMUNITY): Payer: Self-pay

## 2021-03-21 ENCOUNTER — Telehealth (HOSPITAL_COMMUNITY): Payer: Self-pay

## 2021-03-21 DIAGNOSIS — E1165 Type 2 diabetes mellitus with hyperglycemia: Secondary | ICD-10-CM

## 2021-03-21 MED ORDER — METFORMIN HCL ER 500 MG PO TB24: 500.0000 mg | ORAL_TABLET | Freq: Every day | ORAL | 0 refills | Status: DC

## 2021-03-21 NOTE — Telephone Encounter (Signed)
Pt insurance is active and benefits verified through [facility] $0.00, DED $1,500.00/$1,500.00 met, out of pocket $5,000.00/$5,000.00 met, co-insurance 20%. No pre-authorization required. Natasia Sanko/[employer], 03/21/21 @10 :50AM, MBW#[id number] ?  ?Will contact patient to see if he is interested in the Cardiac Rehab Program. ?

## 2021-03-21 NOTE — Telephone Encounter (Signed)
Called patient to see if he was interested in participating in the Cardiac Rehab Program. Patient stated yes. Patient will come in for orientation on 04/12/21 @ 8AM and will attend the 7AM exercise class. Went over insurance, patient verbalized understanding.  ?  ?Tourist information centre manager. ?

## 2021-03-22 NOTE — Telephone Encounter (Signed)
Patient seen in warehouse today.  A&Ox3 spoke full sentences without difficulty gait sure and steady skin warm dry and pink.  He stated was started on hydrochlorothiazide again because blood pressure has been elevated since it was stopped during hospitalization.  He is following up with PCM.  Feeling well denied concerns.  Discussed blood pressure goals now 100-110s/60-70s to help prevent stroke and heart attack events.  Discussed if blood pressure high to avoid more stimulants/caffeine and salt added to diet.  I recommended hydrating with water in these cases.  Patient reported he likes a lot of added salt in diet.  Notified HR Tonya and RN Hildred Alamin did not have case manager contact information or assigned information but he could contact [employer] customer service line to get information.  Patient verbalized understanding information/instructions, agreed with plan of care and had no further questions at this time. ?

## 2021-03-23 ENCOUNTER — Other Ambulatory Visit (HOSPITAL_COMMUNITY): Payer: Self-pay

## 2021-03-23 ENCOUNTER — Other Ambulatory Visit: Payer: Self-pay | Admitting: Infectious Diseases

## 2021-03-23 DIAGNOSIS — G4700 Insomnia, unspecified: Secondary | ICD-10-CM

## 2021-03-29 ENCOUNTER — Other Ambulatory Visit: Payer: No Typology Code available for payment source

## 2021-03-29 ENCOUNTER — Other Ambulatory Visit: Payer: Self-pay

## 2021-03-30 ENCOUNTER — Ambulatory Visit: Payer: No Typology Code available for payment source | Admitting: Nurse Practitioner

## 2021-03-30 LAB — BASIC METABOLIC PANEL
BUN/Creatinine Ratio: 18 (ref 10–24)
BUN: 21 mg/dL (ref 8–27)
CO2: 20 mmol/L (ref 20–29)
Calcium: 9.8 mg/dL (ref 8.6–10.2)
Chloride: 100 mmol/L (ref 96–106)
Creatinine, Ser: 1.18 mg/dL (ref 0.76–1.27)
Glucose: 255 mg/dL — ABNORMAL HIGH (ref 70–99)
Potassium: 4.4 mmol/L (ref 3.5–5.2)
Sodium: 136 mmol/L (ref 134–144)
eGFR: 70 mL/min/{1.73_m2} (ref >59)

## 2021-04-01 ENCOUNTER — Encounter: Payer: Self-pay | Admitting: Cardiology

## 2021-04-01 ENCOUNTER — Other Ambulatory Visit: Payer: Self-pay | Admitting: Infectious Diseases

## 2021-04-01 DIAGNOSIS — G4700 Insomnia, unspecified: Secondary | ICD-10-CM

## 2021-04-02 NOTE — Telephone Encounter (Signed)
Please advise on refill.

## 2021-04-04 ENCOUNTER — Ambulatory Visit: Payer: No Typology Code available for payment source | Admitting: Nurse Practitioner

## 2021-04-05 ENCOUNTER — Encounter: Payer: Self-pay | Admitting: Primary Care

## 2021-04-05 ENCOUNTER — Ambulatory Visit (INDEPENDENT_AMBULATORY_CARE_PROVIDER_SITE_OTHER): Payer: No Typology Code available for payment source | Admitting: Primary Care

## 2021-04-05 ENCOUNTER — Other Ambulatory Visit: Payer: Self-pay | Admitting: Infectious Diseases

## 2021-04-05 ENCOUNTER — Other Ambulatory Visit: Payer: Self-pay

## 2021-04-05 VITALS — BP 140/72 | HR 82 | Temp 98.6°F | Ht 69.0 in | Wt 186.0 lb

## 2021-04-05 DIAGNOSIS — H938X3 Other specified disorders of ear, bilateral: Secondary | ICD-10-CM

## 2021-04-05 DIAGNOSIS — Z95820 Peripheral vascular angioplasty status with implants and grafts: Secondary | ICD-10-CM

## 2021-04-05 DIAGNOSIS — E782 Mixed hyperlipidemia: Secondary | ICD-10-CM

## 2021-04-05 DIAGNOSIS — G4700 Insomnia, unspecified: Secondary | ICD-10-CM

## 2021-04-05 DIAGNOSIS — F17200 Nicotine dependence, unspecified, uncomplicated: Secondary | ICD-10-CM | POA: Diagnosis not present

## 2021-04-05 DIAGNOSIS — E1165 Type 2 diabetes mellitus with hyperglycemia: Secondary | ICD-10-CM | POA: Diagnosis not present

## 2021-04-05 DIAGNOSIS — I10 Essential (primary) hypertension: Secondary | ICD-10-CM

## 2021-04-05 LAB — POCT GLYCOSYLATED HEMOGLOBIN (HGB A1C): Hemoglobin A1C: 6.6 % — AB (ref 4.0–5.6)

## 2021-04-05 MED ORDER — FLUTICASONE PROPIONATE 50 MCG/ACT NA SUSP: 1.0000 | Freq: Two times a day (BID) | NASAL | 1 refills | Status: DC

## 2021-04-05 MED ORDER — TEMAZEPAM 7.5 MG PO CAPS: 7.5000 mg | ORAL_CAPSULE | Freq: Every evening | ORAL | 0 refills | Status: DC | PRN

## 2021-04-05 MED ORDER — NICOTINE 21 MG/24HR TD PT24: 21.0000 mg | MEDICATED_PATCH | Freq: Every day | TRANSDERMAL | 0 refills | Status: DC

## 2021-04-05 NOTE — Assessment & Plan Note (Signed)
LDL goal of 55 or less. ?Continue rosuvastatin 40 mg daily. ? ?We will have lipids checked soon per cardiology.  He is not fasting today. ?

## 2021-04-05 NOTE — Assessment & Plan Note (Signed)
Ready to quit. ? ?Prescription for nicotine 21 mcg just sent to pharmacy.  Discussed recommendations for administration.  We also discussed for him to notify me when he is running low on his prescription so that we can determine if he can reduce to the lower dose. ? ? ?

## 2021-04-05 NOTE — Assessment & Plan Note (Signed)
Hospital notes, labs, imaging reviewed. ? ?Cardiology notes from February 2023 reviewed. ?Start cardiac rehab next week. ? ?Continue carvedilol 6.25 mg twice daily, ACE inhibitor, lipid control, blood pressure control, diabetes control. ?

## 2021-04-05 NOTE — Assessment & Plan Note (Addendum)
Above goal given recent cardiac history.  He recently resumed metformin. ? ?Continue metformin XR 500 mg daily.  He will also work on his diet and exercise regimen.  Referral placed for diabetes nutritionist. ? ?We will see him back in 3 months for diabetes follow-up. ?

## 2021-04-05 NOTE — Patient Instructions (Addendum)
Nasal Congestion/Ear Pressure/Sinus Pressure: Try using Flonase (fluticasone) nasal spray. Instill 1 spray in each nostril twice daily.  ? ?Consider an antihistamine for your ear fullness such as Zyrtec at bedtime. ? ?You will be contacted regarding your referral to the nutritionist.  Please let us know if you have not been contacted within two weeks.  ? ?Start the nicotine patches.  Replace 1 patch daily, rotate sites of use. ? ?It was a pleasure to see you today! ? ?

## 2021-04-05 NOTE — Assessment & Plan Note (Signed)
Improving, home readings are lower. ? ?Continue lisinopril-hydrochlorothiazide 20-25 mg daily, carvedilol 6.25 mg twice daily. ?

## 2021-04-05 NOTE — Assessment & Plan Note (Signed)
Suspect allergy involvement. ? ?Start Flonase, 1 spray each nostril twice daily. ?Discussed antihistamine use if needed. ? ?Follow-up as needed. ?

## 2021-04-05 NOTE — Progress Notes (Signed)
? ?Subjective:  ? ? Patient ID: Jacob Ali, male    DOB: 14-May-1960, 61 y.o.   MRN: [id number] ? ?HPI ? ?Jacob Ali is a very pleasant 61 y.o. male with a history of hypertension, atherosclerosis with claudication of extremity, unstable angina, CAD, type 2 diabetes, hyperlipidemia, insomnia, HIV, tobacco abuse who presents today for hospital follow up. ? ?1) Hospital Follow Up: He presented to [place] ED on 01/30/2021 for symptoms of chest and back pain.  He endorses squeezing tightness sensation across his chest with radiation to mid upper back between shoulder blades.  Work-up included chest x-ray which was negative for acute abnormalities, EKG with subtle ST depression inferiorly.  He underwent CT a chest which was negative for dissection, some gallstones noted.  Cardiology was consulted who recommended cardiac CT and transferred to [facility], ED. ? ?After transfer to [facility] he it was recommended to cancel cardiac CT and move forward with left heart cath. He was found to have 95% blockage to mid LAD, successful placement of LAD stent with reduction in stenossis to 0%. Right coronary with eccentric 70% proximal to mid stenosis and tandem 40% mid stenosis.  Recommendations were for aggressive lipid-lowering therapy, LDL less than 55, smoking cessation, diabetes control, cardiac rehab.  He was initiated on aspirin 81 mg daily, clopidogrel 75 mg daily, rosuvastatin 40 mg daily, carvedilol 3.125 mg twice daily.  His lisinopril -HCTZ was changed to lisinopril 20 mg.  He was discharged home on 01/31/2021. ? ?Since his hospital stay he has been evaluated by cardiology, last visit was in mid February 2023, BP was above goal, also with home readings.  He endorsed compliance to all medications.  During this visit his carvedilol was increased to 6.25 mg twice daily, lisinopril 20 mg continued but with plans to resume HCTZ if blood pressure remained elevated. ? ?He sent a message to our team via [facility] endorsing  increased glucose readings so metformin XR 500 mg was re-initiated.  ? ?Today he endorses his cardiologist resumed his lisinopril-HCTZ 20-25 mg. He is checking his BP at home with a wrist cuff which is running 120's/70's. He is not checking glucose levels now as his glucometer is broken. He continues to have no problems with rosuvastatin 40 mg.  ? ?He's had no issues with metformin. He would like a referral for a nutritionist, has 6 free sessions available to him from his insurance company. He will start cardiac rehab on 04/12/21.  ? ?2) Ear Fullness: He would also like to discuss ear fullness. Chronic and intermittent for the last 1 year, "feels like there is water in my ears".  ? ?He denies ear pain. He does have seasonal allergy symptoms including sinus pressure, rhinorrhea. He cannot take antihistamines as "they make me feel weird". He has not used Flonase since Fall 2022.  ? ?3) Tobacco Abuse: Chronic for years. Currently smoking 5 cigarettes to 1 PPD. He is ready to quit smoking. He's tried nicotine gum in the past, can't stand the taste, causes GI upset. He would like to try nicotine patches.  ? ?BP Readings from Last 3 Encounters:  ?04/05/21 140/72  ?03/02/21 (!) 158/62  ?01/31/21 (!) 147/75  ? ? ? ?Review of Systems  ?Constitutional:  Negative for fever.  ?HENT:  Positive for rhinorrhea and sinus pressure.   ?     Ear fullness   ?Respiratory:  Negative for shortness of breath.   ?Cardiovascular:  Negative for chest pain.  ?Gastrointestinal:  Negative for abdominal  pain.  ?Musculoskeletal:  Negative for myalgias.  ?Neurological:  Positive for headaches. Negative for dizziness.  ? ?   ? ? ?Past Medical History:  ?Diagnosis Date  ? CAD in native artery 01/31/2021  ? Depression   ? GERD (gastroesophageal reflux disease)   ? HIV infection ([place])   ? Hyperlipidemia   ? Hypertension   ? Peripheral vascular disease ([place])   ? PONV (postoperative nausea and vomiting)   ? S/P angioplasty with stent 01/30/21 to LAD   01/31/2021  ? Sleep apnea   ? Subdural hemorrhage ([place])   ? Tubular adenoma of colon 11/03/2017  ? Colonoscopy Sept 9, 2019  ? ? ?Social History  ? ?Socioeconomic History  ? Marital status: Single  ?  Spouse name: Not on file  ? Number of children: Not on file  ? Years of education: 43  ? Highest education level: Not on file  ?Occupational History  ? Occupation: Art gallery manager  ?  Employer: [employer]  ?Tobacco Use  ? Smoking status: Every Day  ?  Packs/day: 0.50  ?  Years: 40.00  ?  Pack years: 20.00  ?  Types: Cigars, Cigarettes  ? Smokeless tobacco: Never  ? Tobacco comments:  ?  1/2PPD  ?Substance and Sexual Activity  ? Alcohol use: Yes  ?  Alcohol/week: 3.0 standard drinks  ?  Types: 3 Standard drinks or equivalent per week  ?  Comment: 2-3 times a week  ? Drug use: Yes  ?  Frequency: 2.0 times per week  ?  Types: Marijuana  ? Sexual activity: Not Currently  ?  Partners: Male  ?  Birth control/protection: Condom  ?  Comment: pt. given condoms  ?Other Topics Concern  ? Not on file  ?Social History Narrative  ? Single, lives alone.    ? ?Social Determinants of Health  ? ?Financial Resource Strain: Not on file  ?Food Insecurity: Not on file  ?Transportation Needs: Not on file  ?Physical Activity: Not on file  ?Stress: Not on file  ?Social Connections: Not on file  ?Intimate Partner Violence: Not on file  ? ? ?Past Surgical History:  ?Procedure Laterality Date  ? CORONARY STENT INTERVENTION N/A 01/30/2021  ? Procedure: CORONARY STENT INTERVENTION;  Surgeon: Belva Crome, MD;  Location: [facility];  Service: Cardiovascular;  Laterality: N/A;  ? Examination under anesthesia, repair of anal fissure,  12/04/2000  ? LEFT HEART CATH AND CORONARY ANGIOGRAPHY N/A 01/30/2021  ? Procedure: LEFT HEART CATH AND CORONARY ANGIOGRAPHY;  Surgeon: Belva Crome, MD;  Location: [facility];  Service: Cardiovascular;  Laterality: N/A;  ? PERIPHERAL VASCULAR CATHETERIZATION Left 02/06/2016  ? Procedure: Lower  Extremity Angiography;  Surgeon: Katha Cabal, MD;  Location: [facility];  Service: Cardiovascular;  Laterality: Left;  ? PERIPHERAL VASCULAR CATHETERIZATION N/A 02/06/2016  ? Procedure: Abdominal Aortogram w/Lower Extremity;  Surgeon: Katha Cabal, MD;  Location: [facility];  Service: Cardiovascular;  Laterality: N/A;  ? PERIPHERAL VASCULAR CATHETERIZATION  02/06/2016  ? Procedure: Lower Extremity Intervention;  Surgeon: Katha Cabal, MD;  Location: [facility];  Service: Cardiovascular;;  ? ? ?Family History  ?Problem Relation Age of Onset  ? Throat cancer Father   ? Heart disease Father   ? Cancer Father   ?     bladder  ? Cancer Maternal Grandmother   ?     colon  ? ? ?Allergies  ?Allergen Reactions  ?  Atorvastatin Other (See Comments)  ?  myalgias  ? Tamiflu [Oseltamivir Phosphate] Other (See Comments)  ?  Depressive symptoms  ? ? ?Current Outpatient Medications on File Prior to Visit  ?Medication Sig Dispense Refill  ? ACCU-CHEK GUIDE test strip USE UP TO 4 TIMES A DAY AS DIRECTED 100 strip 5  ? Accu-Chek Softclix Lancets lancets USE UP TO 4 TIMES A DAY AS DIRECTED 100 each 5  ? acetaminophen (TYLENOL) 325 MG tablet Take 2 tablets (650 mg total) by mouth every 4 (four) hours as needed for headache or mild pain.    ? aspirin 81 MG chewable tablet Chew 1 tablet (81 mg total) by mouth daily.    ? bictegravir-emtricitabine-tenofovir AF (BIKTARVY) 50-200-25 MG TABS tablet TAKE 1 TABLET BY MOUTH DAILY. 30 tablet 6  ? carvedilol (COREG) 6.25 MG tablet Take 1 tablet (6.25 mg total) by mouth 2 (two) times daily with a meal. 60 tablet 2  ? clopidogrel (PLAVIX) 75 MG tablet TAKE 1 TABLET BY MOUTH EVERY DAY 90 tablet 2  ? lisinopril-hydrochlorothiazide (ZESTORETIC) 20-25 MG tablet Take 1 tablet by mouth daily. 90 tablet 3  ? metFORMIN (GLUCOPHAGE-XR) 500 MG 24 hr tablet Take 1 tablet (500 mg total) by mouth daily with breakfast. For diabetes. 90 tablet 0  ? nitroGLYCERIN  (NITROSTAT) 0.4 MG SL tablet Place 1 tablet (0.4 mg total) under the tongue every 5 (five) minutes x 3 doses as needed for chest pain. 25 tablet 4  ? Omega-3 Fatty Acids (FISH OIL) 1000 MG CAPS Take by mouth. One every oth

## 2021-04-06 ENCOUNTER — Other Ambulatory Visit (INDEPENDENT_AMBULATORY_CARE_PROVIDER_SITE_OTHER): Payer: Self-pay | Admitting: Vascular Surgery

## 2021-04-06 ENCOUNTER — Telehealth: Payer: Self-pay

## 2021-04-06 ENCOUNTER — Other Ambulatory Visit (HOSPITAL_COMMUNITY): Payer: Self-pay

## 2021-04-06 DIAGNOSIS — I739 Peripheral vascular disease, unspecified: Secondary | ICD-10-CM

## 2021-04-06 NOTE — Telephone Encounter (Signed)
Patient called office states that he still has not received refill. Patient became aggressive and would like for you to call this in. "I deserve sleep just like Everone else" He states he has called both pharmacy  and neither have.  ?

## 2021-04-08 NOTE — Telephone Encounter (Signed)
Please kindly notify patient that I don't prescribe temazepam for sleep, so if he cannot get a refill through his regular prescriber then I am happy to meet with him to find something safer.  ?

## 2021-04-09 ENCOUNTER — Other Ambulatory Visit: Payer: Self-pay

## 2021-04-09 ENCOUNTER — Ambulatory Visit (INDEPENDENT_AMBULATORY_CARE_PROVIDER_SITE_OTHER): Payer: No Typology Code available for payment source | Admitting: Nurse Practitioner

## 2021-04-09 ENCOUNTER — Ambulatory Visit (INDEPENDENT_AMBULATORY_CARE_PROVIDER_SITE_OTHER): Payer: No Typology Code available for payment source | Admitting: Vascular Surgery

## 2021-04-09 ENCOUNTER — Other Ambulatory Visit (INDEPENDENT_AMBULATORY_CARE_PROVIDER_SITE_OTHER): Payer: Self-pay | Admitting: Vascular Surgery

## 2021-04-09 ENCOUNTER — Ambulatory Visit (INDEPENDENT_AMBULATORY_CARE_PROVIDER_SITE_OTHER): Payer: No Typology Code available for payment source

## 2021-04-09 ENCOUNTER — Encounter (INDEPENDENT_AMBULATORY_CARE_PROVIDER_SITE_OTHER): Payer: Self-pay | Admitting: Nurse Practitioner

## 2021-04-09 VITALS — BP 136/73 | HR 78 | Resp 17 | Ht 69.0 in | Wt 183.4 lb

## 2021-04-09 DIAGNOSIS — I739 Peripheral vascular disease, unspecified: Secondary | ICD-10-CM

## 2021-04-09 DIAGNOSIS — I10 Essential (primary) hypertension: Secondary | ICD-10-CM | POA: Diagnosis not present

## 2021-04-09 DIAGNOSIS — F17200 Nicotine dependence, unspecified, uncomplicated: Secondary | ICD-10-CM | POA: Diagnosis not present

## 2021-04-09 DIAGNOSIS — E782 Mixed hyperlipidemia: Secondary | ICD-10-CM | POA: Diagnosis not present

## 2021-04-09 NOTE — Telephone Encounter (Signed)
Called patient he will try to get in touch with provider that prescribed. He declined appointment at this time. Will call back if that changes and he needs appointment.  ?

## 2021-04-10 ENCOUNTER — Telehealth (HOSPITAL_COMMUNITY): Payer: Self-pay

## 2021-04-11 NOTE — Telephone Encounter (Signed)
Patient had PCM office visit 04/05/21.  Starting cardiac rehab this week/appts scheduled and follow up with vascular surgery. ?

## 2021-04-12 ENCOUNTER — Encounter (HOSPITAL_COMMUNITY): Payer: Self-pay

## 2021-04-12 ENCOUNTER — Other Ambulatory Visit (HOSPITAL_COMMUNITY): Payer: Self-pay

## 2021-04-12 ENCOUNTER — Encounter (HOSPITAL_COMMUNITY)
Admission: RE | Admit: 2021-04-12 | Discharge: 2021-04-12 | Disposition: A | Discharge status: Home / Self Care | Payer: No Typology Code available for payment source | Source: Ambulatory Visit | Attending: Cardiology | Admitting: Cardiology

## 2021-04-12 VITALS — BP 118/74 | HR 82 | Ht 68.25 in | Wt 185.0 lb

## 2021-04-12 DIAGNOSIS — Z955 Presence of coronary angioplasty implant and graft: Secondary | ICD-10-CM | POA: Diagnosis present

## 2021-04-12 LAB — GLUCOSE, CAPILLARY: Glucose-Capillary: 236 mg/dL — ABNORMAL HIGH (ref 70–99)

## 2021-04-12 NOTE — Progress Notes (Addendum)
Cardiac Rehab Medication Review by a Nurse ? ?Does the patient  feel that his/her medications are working for him/her?  YES  ? ?Has the patient been experiencing any side effects to the medications prescribed?   NO ? ?Does the patient measure his/her own blood pressure or blood glucose at home?   NO ? ?Does the patient have any problems obtaining medications due to transportation or finances?   NO ? ?Understanding of regimen: good ?Understanding of indications: good ?Potential of compliance: good ? ? ? ?Nurse comments: Jacob Ali is taking his medications as prescribed and has a good understanding of what his medications are for. Jacob Ali has not been checking his CBG's at home as he been getting some readings  in the 200's at home. Jacob Ali does not check his blood pressures on a regular basis. Jacob Ali has a wrist BP cuff at home. Jacob Ali says he has been experiencing a lot of gas he is not sure if having gas is a side effect from one of his medications.Jacob Ali also reports he has not been able to get his medication for sleep filled. ? ? ? ?Harrell Gave RN ?04/12/2021 9:19 AM ?  ?

## 2021-04-12 NOTE — Progress Notes (Signed)
Cardiac Individual Treatment Plan ? ?Patient Details  ?Ali: Jacob Ali ?MRN: [id number] ?Date of Birth: [birth year] ?Referring Provider:   ?Flowsheet Row CARDIAC REHAB PHASE II ORIENTATION from 04/12/2021 in [facility]  ?Referring Provider Dr. Gwyndolyn Kaufman, MD  ? ?  ? ? ?Initial Encounter Date:  ?Flowsheet Row CARDIAC REHAB PHASE II ORIENTATION from 04/12/2021 in [facility]  ?Date 04/12/21  ? ?  ? ? ?Visit Diagnosis: 01/30/21 S/P DES LAD ? ?Patient's Home Medications on Admission: ? ?Current Outpatient Medications:  ?  acetaminophen (TYLENOL) 500 MG tablet, Take 1,000 mg by mouth every 6 (six) hours as needed for moderate pain., Disp: , Rfl:  ?  aspirin 81 MG chewable tablet, Chew 1 tablet (81 mg total) by mouth daily., Disp: , Rfl:  ?  bictegravir-emtricitabine-tenofovir AF (BIKTARVY) 50-200-25 MG TABS tablet, TAKE 1 TABLET BY MOUTH DAILY., Disp: 30 tablet, Rfl: 6 ?  carvedilol (COREG) 6.25 MG tablet, Take 1 tablet (6.25 mg total) by mouth 2 (two) times daily with a meal., Disp: 60 tablet, Rfl: 2 ?  clopidogrel (PLAVIX) 75 MG tablet, TAKE 1 TABLET BY MOUTH EVERY DAY, Disp: 90 tablet, Rfl: 2 ?  diphenhydramine-acetaminophen (TYLENOL PM) 25-500 MG TABS tablet, Take 1 tablet by mouth at bedtime as needed (sleep)., Disp: , Rfl:  ?  fluticasone (FLONASE) 50 MCG/ACT nasal spray, Place 1 spray into both nostrils 2 (two) times daily., Disp: 48 g, Rfl: 1 ?  lisinopril-hydrochlorothiazide (ZESTORETIC) 20-25 MG tablet, Take 1 tablet by mouth daily., Disp: 90 tablet, Rfl: 3 ?  metFORMIN (GLUCOPHAGE-XR) 500 MG 24 hr tablet, Take 1 tablet (500 mg total) by mouth daily with breakfast. For diabetes., Disp: 90 tablet, Rfl: 0 ?  nicotine (NICODERM CQ - DOSED IN MG/24 HOURS) 21 mg/24hr patch, Place 1 patch (21 mg total) onto the skin daily., Disp: 28 patch, Rfl: 0 ?  nitroGLYCERIN (NITROSTAT) 0.4 MG SL tablet, Place 1 tablet (0.4 mg total) under the tongue every 5  (five) minutes x 3 doses as needed for chest pain., Disp: 25 tablet, Rfl: 4 ?  pantoprazole (PROTONIX) 40 MG tablet, Take 1 tablet (40 mg total) by mouth daily. For heartburn., Disp: 90 tablet, Rfl: 3 ?  rosuvastatin (CRESTOR) 40 MG tablet, Take 1 tablet (40 mg total) by mouth daily., Disp: 30 tablet, Rfl: 6 ?  temazepam (RESTORIL) 7.5 MG capsule, Take 1 capsule (7.5 mg total) by mouth at bedtime as needed for sleep., Disp: 30 capsule, Rfl: 0 ?  ACCU-CHEK GUIDE test strip, USE UP TO 4 TIMES A DAY AS DIRECTED, Disp: 100 strip, Rfl: 5 ?  Accu-Chek Softclix Lancets lancets, USE UP TO 4 TIMES A DAY AS DIRECTED, Disp: 100 each, Rfl: 5 ? ?Past Medical History: ?Past Medical History:  ?Diagnosis Date  ? CAD in native artery 01/31/2021  ? Depression   ? GERD (gastroesophageal reflux disease)   ? HIV infection ([place])   ? Hyperlipidemia   ? Hypertension   ? Peripheral vascular disease ([place])   ? PONV (postoperative nausea and vomiting)   ? S/P angioplasty with stent 01/30/21 to LAD  01/31/2021  ? Sleep apnea   ? Subdural hemorrhage ([place])   ? Tubular adenoma of colon 11/03/2017  ? Colonoscopy Sept 9, 2019  ? ? ?Tobacco Use: ?Social History  ? ?Tobacco Use  ?Smoking Status Every Day  ? Packs/day: 0.50  ? Years: 40.00  ? Pack years: 20.00  ? Types: Cigars, Cigarettes  ?Smokeless  Tobacco Never  ?Tobacco Comments  ? 1/2PPD Jacob Ali was wearing a nicotine patch today and says he plans to quit smoking. Given the phone number to 1-800=quit-now   ? ? ?Labs: ?Review Flowsheet   ? ?  ?  Latest Ref Rng & Units 06/26/2020 09/13/2020 11/14/2020 01/24/2021  ?Labs for ITP Cardiac and Pulmonary Rehab  ?Cholestrol 0 - 200 mg/dL 140   207   162   169    ?LDL (calc) mg/dL (calc) 86       ?Direct LDL mg/dL  124.0    98.0    ?HDL-C >39.00 mg/dL 29   30.60   25   29.00    ?Trlycerides 0.0 - 149.0 mg/dL 138   379.0   571   288.0    ?Hemoglobin A1c 4.0 - 5.6 % 4.4     5.7    ? ?  04/05/2021  ?Labs for ITP Cardiac and Pulmonary Rehab  ?Cholestrol   ?LDL (calc)    ?Direct LDL   ?HDL-C   ?Trlycerides   ?Hemoglobin A1c 6.6    ?  ? ? Multiple values from one day are sorted in reverse-chronological order  ?  ?  ? ? ?Capillary Blood Glucose: ?Lab Results  ?Component Value Date  ? GLUCAP 236 (H) 04/12/2021  ? ? ? ?Exercise Target Goals: ?Exercise Program Goal: ?Individual exercise prescription set using results from initial 6 min walk test and THRR while considering  patient?s activity barriers and safety.  ? ?Exercise Prescription Goal: ?Starting with aerobic activity 30 plus minutes a day, 3 days per week for initial exercise prescription. Provide home exercise prescription and guidelines that participant acknowledges understanding prior to discharge. ? ?Activity Barriers & Risk Stratification: ? Activity Barriers & Cardiac Risk Stratification - 04/12/21 1009   ? ?  ? Activity Barriers & Cardiac Risk Stratification  ? Activity Barriers Deconditioning   ? Cardiac Risk Stratification High   ? ?  ?  ? ?  ? ? ?6 Minute Walk: ? 6 Minute Walk   ? ? Jacob Ali 04/12/21 1001  ?  ?  ?  ? 6 Minute Walk  ? Phase Initial    ? Distance 1200 feet    ? Walk Time 6 minutes    ? # of Rest Breaks 0    ? MPH 2.27    ? METS 2.94    ? RPE 10    ? Perceived Dyspnea  0    ? VO2 Peak 10.27    ? Symptoms Yes (comment)    ? Comments 3.20 mins in, c/o tightness in leg from claudication. 7/10 pain last lap bilate hip and left leg    ? Resting HR 82 bpm    ? Resting BP 118/74    ? Resting Oxygen Saturation  95 %    ? Exercise Oxygen Saturation  during 6 min walk 96 %    ? Max Ex. HR 90 bpm    ? Max Ex. BP 112/62    ? 2 Minute Post BP 114/60    ? ?  ?  ? ?  ? ? ?Oxygen Initial Assessment: ? ? ?Oxygen Re-Evaluation: ? ? ?Oxygen Discharge (Final Oxygen Re-Evaluation): ? ? ?Initial Exercise Prescription: ? Initial Exercise Prescription - 04/12/21 1000   ? ?  ? Date of Initial Exercise RX and Referring Provider  ? Date 04/12/21   ? Referring Provider Dr. Gwyndolyn Kaufman, MD   ? Expected Discharge Date 06/08/21    ?  ?  NuStep  ? Level 2   ? SPM 80   ? Minutes 15   ? METs 2   ?  ? Track  ? Laps 10   ? Minutes 15   ? METs 2.16   ?  ? Prescription Details  ? Frequency (times per week) 3   ? Duration Progress to 30 minutes of continuous aerobic without signs/symptoms of physical distress   ?  ? Intensity  ? THRR 40-80% of Max Heartrate 63-126   ? Ratings of Perceived Exertion 11-13   ? Perceived Dyspnea 0-4   ?  ? Progression  ? Progression Continue progressive overload as per policy without signs/symptoms or physical distress.   ?  ? Resistance Training  ? Training Prescription Yes   ? Weight 3   ? Reps 10-15   ? ?  ?  ? ?  ? ? ?Perform Capillary Blood Glucose checks as needed. ? ?Exercise Prescription Changes: ? ? ?Exercise Comments: ? ? ?Exercise Goals and Review: ? ? Exercise Goals   ? ? [place] Ali 04/12/21 1020  ?  ?  ?  ?  ?  ? Exercise Goals  ? Increase Physical Activity Yes      ? Intervention Provide advice, education, support and counseling about physical activity/exercise needs.;Develop an individualized exercise prescription for aerobic and resistive training based on initial evaluation findings, risk stratification, comorbidities and participant's personal goals.      ? Expected Outcomes Short Term: Attend rehab on a regular basis to increase amount of physical activity.;Long Term: Add in home exercise to make exercise part of routine and to increase amount of physical activity.;Long Term: Exercising regularly at least 3-5 days a week.      ? Increase Strength and Stamina Yes      ? Intervention Provide advice, education, support and counseling about physical activity/exercise needs.;Develop an individualized exercise prescription for aerobic and resistive training based on initial evaluation findings, risk stratification, comorbidities and participant's personal goals.      ? Expected Outcomes Short Term: Increase workloads from initial exercise prescription for resistance, speed, and METs.;Short Term: Perform  resistance training exercises routinely during rehab and add in resistance training at home;Long Term: Improve cardiorespiratory fitness, muscular endurance and strength as measured by increased METs and f

## 2021-04-13 ENCOUNTER — Other Ambulatory Visit: Payer: Self-pay

## 2021-04-14 ENCOUNTER — Other Ambulatory Visit (INDEPENDENT_AMBULATORY_CARE_PROVIDER_SITE_OTHER): Payer: Self-pay | Admitting: Nurse Practitioner

## 2021-04-16 ENCOUNTER — Encounter (HOSPITAL_COMMUNITY)
Admission: RE | Admit: 2021-04-16 | Discharge: 2021-04-16 | Disposition: A | Discharge status: Home / Self Care | Payer: No Typology Code available for payment source | Source: Ambulatory Visit | Attending: Cardiology | Admitting: Cardiology

## 2021-04-16 ENCOUNTER — Telehealth (HOSPITAL_COMMUNITY): Payer: Self-pay | Admitting: *Deleted

## 2021-04-16 DIAGNOSIS — Z955 Presence of coronary angioplasty implant and graft: Secondary | ICD-10-CM | POA: Diagnosis not present

## 2021-04-16 DIAGNOSIS — Z48812 Encounter for surgical aftercare following surgery on the circulatory system: Secondary | ICD-10-CM | POA: Diagnosis present

## 2021-04-16 LAB — GLUCOSE, CAPILLARY
Glucose-Capillary: 290 mg/dL — ABNORMAL HIGH (ref 70–99)
Glucose-Capillary: 240 mg/dL — ABNORMAL HIGH (ref 70–99)

## 2021-04-16 NOTE — Progress Notes (Signed)
Daily Session Note ? ?Patient Details  ?Name: Jacob Ali ?MRN: [id number] ?Date of Birth: 1960/03/21 ?Referring Provider:   ?Flowsheet Row CARDIAC REHAB PHASE II ORIENTATION from 04/12/2021 in [place]  ?Referring Provider Dr. Gwyndolyn Kaufman, MD  ? ?  ? ? ?Encounter Date: 04/16/2021 ? ?Check In: ? Session Check In - 04/16/21 0706   ? ?  ? Check-In  ? Supervising physician immediately available to respond to emergencies [facility] immediately available   ? Physician(s) Dr. Maryland Pink   ? Location [facility]   ? Staff Present Maurice Small, RN, Deland Pretty, MS, ACSM CEP, Exercise Physiologist;Kaylee Rosana Hoes, MS, ACSM-CEP, Exercise Physiologist;Annedrea Rosezella [place], RN, MHA;Jetta Walker BS, ACSM EP-C, Exercise Physiologist   ? Virtual Visit No   ? Medication changes reported     No   ? Fall or balance concerns reported    No   ? Tobacco Cessation No Change   ? Warm-up and Cool-down Performed as group-led instruction   ? Resistance Training Performed Yes   ? VAD Patient? No   ? PAD/SET Patient? No   ?  ? Pain Assessment  ? Currently in Pain? Yes   ? Pain Score 7    ? Pain Location Hip   ? Pain Orientation Left   ? Pain Descriptors / Indicators Cramping   ? Pain Type Chronic pain   ? Pain Onset More than a month ago   ? Pain Frequency Intermittent   ? Multiple Pain Sites No   ? ?  ?  ? ?  ? ? ?Capillary Blood Glucose: ?Results for orders placed or performed during the hospital encounter of 04/16/21 (from the past 24 hour(s))  ?Glucose, capillary     Status: Abnormal  ? Collection Time: 04/16/21  6:58 AM  ?Result Value Ref Range  ? Glucose-Capillary 290 (H) 70 - 99 mg/dL  ?Glucose, capillary     Status: Abnormal  ? Collection Time: 04/16/21  8:00 AM  ?Result Value Ref Range  ? Glucose-Capillary 240 (H) 70 - 99 mg/dL  ? ? ? ? ?Social History  ? ?Tobacco Use  ?Smoking Status Every Day  ? Packs/day: 0.50  ? Years: 40.00  ? Pack years: 20.00  ? Types: Cigars,  Cigarettes  ?Smokeless Tobacco Never  ?Tobacco Comments  ? 1/2PPD Mr Deardorff was wearing a nicotine patch today and says he plans to quit smoking. Given the phone number to 1-800=quit-now   ? ? ?Goals Met:  ?Exercise tolerated well ?Personal goals reviewed ?No report of concerns or symptoms today ?Strength training completed today ? ?Goals Unmet:  ?Not Applicable ? ? ?Comments:  ?Pt started cardiac rehab today.  Pt tolerated light exercise without difficulty. VSS, telemetry-SR, asymptomatic.  Medication list reconciled. Pt denies barriers to medication compliance.  PSYCHOSOCIAL ASSESSMENT:  PHQ-4. Pt exhibits positive coping skills, hopeful outlook with supportive neighbors .   Pt enjoys traveling short distances with his dog - Missy.   Pt oriented to exercise equipment and routine.    Understanding verbalized. Cherre Huger, BSN ?Cardiac and Pulmonary Rehab Nurse Navigator  ? ? ? ? ?Dr. Fransico Him is Medical Director for Cardiac Rehab at [facility]. ?

## 2021-04-16 NOTE — Telephone Encounter (Signed)
-----   Message from Pleas Koch, NP sent at 04/16/2021  4:56 PM EDT ----- ?Regarding: RE: Elevated Blood glucose ?Hi Tori Cupps! ? ?Thank you for the update! ? ?I sent [name] a message via [place], but will you also contact him? ?I agree to increasing his metformin XR to 1000 mg daily (2 tabs). He can take both tablets together in the morning or in divided doses. Have him contact me when he needs a refill. ? ?Allie Bossier, NP-C ? ?----- Message ----- ?From: Rowe Pavy, RN ?Sent: 04/16/2021  11:18 AM EDT ?To: Pleas Koch, NP ?Subject: Elevated Blood glucose                        ? ? ?Hi Belenda Cruise! ? ?The above mutual patient started cardiac rehab s/p DES LAD on 01/30/21. We monitor pre and post exercise blood glucose.   Pt is in the high 200's both here and at home. Concerned as he is taking his medication consistently and monitors his diet very closely. ? ?[name] wonders if he should increase his Metformin to twice a day. He mentioned he did take Metformin twice a day at the beginning and when his HgA1C improved he was able to discontinue. Metformin was added back when he HgA1C increased. ? ?Our cut off for pre blood glucose  is 300.  This morning he was 290. (Note this was not postprandial) however his exit blood glucose was 240 ( this would be postprandial). ? ?Your thoughts ? ?Cherre Huger, BSN ?Cardiac and Pulmonary Rehab Nurse Navigator  ? ? ? ? ? ? ? ?
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
## 2021-04-17 ENCOUNTER — Telehealth (HOSPITAL_COMMUNITY): Payer: Self-pay | Admitting: *Deleted

## 2021-04-17 NOTE — Telephone Encounter (Signed)
Called and spoke to pt to confirm he did receive the message for increasing metformin.  Pt plans to take one in the am and one in the pm. Maurice Small RN, BSN ?Cardiac and Pulmonary Rehab Nurse Navigator  ? ?

## 2021-04-17 NOTE — Progress Notes (Deleted)
?Cardiology Office Note:   ? ?Date:  04/17/2021  ? ?ID:  Jacob Ali, DOB 10/01/1960, MRN [id number] ? ?PCP:  Pleas Koch, NP ?  ?[facility] Providers ?Cardiologist:  [name], MD { ? ?Referring MD: Pleas Koch, NP  ? ? ?History of Present Illness:   ? ?Jacob Ali is a 61 y.o. male with a hx of CAD s/p PCI to mid-LAD 01/2021, PAD, GERD, HIV, HLD, HTN, OSA, current smoker, and hx of colon cancer who presents to clinic for hospital follow-up. ? ?Patient was recently hospitalized in 01/2021 for chest pain with mild STD inferior and lateral leads. Trop 34>31. Cath showed 95% mLAD lesion s/p PCI with excellent results.  ? ?Today, *** ? ?Past Medical History:  ?Diagnosis Date  ? CAD in native artery 01/31/2021  ? Depression   ? GERD (gastroesophageal reflux disease)   ? HIV infection ([place])   ? Hyperlipidemia   ? Hypertension   ? Peripheral vascular disease ([place])   ? PONV (postoperative nausea and vomiting)   ? S/P angioplasty with stent 01/30/21 to LAD  01/31/2021  ? Sleep apnea   ? Subdural hemorrhage ([place])   ? Tubular adenoma of colon 11/03/2017  ? Colonoscopy Sept 9, 2019  ? ? ?Past Surgical History:  ?Procedure Laterality Date  ? CARDIAC CATHETERIZATION    ? CORONARY STENT INTERVENTION N/A 01/30/2021  ? Procedure: CORONARY STENT INTERVENTION;  Surgeon: Belva Crome, MD;  Location: [facility];  Service: Cardiovascular;  Laterality: N/A;  ? Examination under anesthesia, repair of anal fissure,  12/04/2000  ? LEFT HEART CATH AND CORONARY ANGIOGRAPHY N/A 01/30/2021  ? Procedure: LEFT HEART CATH AND CORONARY ANGIOGRAPHY;  Surgeon: Belva Crome, MD;  Location: [facility];  Service: Cardiovascular;  Laterality: N/A;  ? PERIPHERAL VASCULAR CATHETERIZATION Left 02/06/2016  ? Procedure: Lower Extremity Angiography;  Surgeon: Katha Cabal, MD;  Location: [facility];  Service: Cardiovascular;  Laterality: Left;  ? PERIPHERAL VASCULAR CATHETERIZATION N/A 02/06/2016   ? Procedure: Abdominal Aortogram w/Lower Extremity;  Surgeon: Katha Cabal, MD;  Location: [facility];  Service: Cardiovascular;  Laterality: N/A;  ? PERIPHERAL VASCULAR CATHETERIZATION  02/06/2016  ? Procedure: Lower Extremity Intervention;  Surgeon: Katha Cabal, MD;  Location: [facility];  Service: Cardiovascular;;  ? ? ?Current Medications: ?No outpatient medications have been marked as taking for the 04/20/21 encounter (Appointment) with [name], MD.  ?  ? ?Allergies:   Atorvastatin and Tamiflu [oseltamivir phosphate]  ? ?Social History  ? ?Socioeconomic History  ? Marital status: Single  ?  Spouse name: Not on file  ? Number of children: Not on file  ? Years of education: 20  ? Highest education level: High school graduate  ?Occupational History  ? Occupation: Art gallery manager  ?  Employer: [employer]  ?Tobacco Use  ? Smoking status: Every Day  ?  Packs/day: 0.50  ?  Years: 40.00  ?  Pack years: 20.00  ?  Types: Cigars, Cigarettes  ? Smokeless tobacco: Never  ? Tobacco comments:  ?  1/2PPD Mr Duval was wearing a nicotine patch today and says he plans to quit smoking. Given the phone number to 1-800=quit-now   ?Vaping Use  ? Vaping Use: Former  ?Substance and Sexual Activity  ? Alcohol use: Yes  ?  Alcohol/week: 2.0 standard drinks  ?  Types: 2 Standard drinks or equivalent per week  ?  Comment: 1-2 times a week  ? Drug use: Not Currently  ?  Types: Marijuana  ?  Comment: 2 per month  ? Sexual activity: Not Currently  ?  Partners: Male  ?  Birth control/protection: Condom  ?  Comment: pt. given condoms  ?Other Topics Concern  ? Not on file  ?Social History Narrative  ? Single, lives alone.    ? ?Social Determinants of Health  ? ?Financial Resource Strain: Not on file  ?Food Insecurity: Not on file  ?Transportation Needs: Not on file  ?Physical Activity: Not on file  ?Stress: Not on file  ?Social Connections: Not on file  ?  ? ?Family History: ?The patient's  ***family history includes Asthma in his mother; Cancer in his father and maternal grandmother; Heart disease in his father; Hypertension in his mother; Throat cancer in his father. ? ?ROS:   ?Please see the history of present illness.    ?*** All other systems reviewed and are negative. ? ?EKGs/Labs/Other Studies Reviewed:   ? ?The following studies were reviewed today: ?Cardiac cath 01/30/21 ?CONCLUSIONS: ?95% mid LAD.  First diagonal 70 to 80% ostial narrowing. ?Successful LAD stent with reduction in stenosis to 0% with TIMI grade III flow. ?Left main widely patent ?Circumflex with luminal irregularities ?Right coronary with eccentric 70% proximal to mid stenosis and tandem 40% mid stenosis. ?Normal LV function.  LVEDP 8 mmHg.  EF 55%. ?  ?RECOMMENDATIONS: ?  ?Aggressive lipid-lowering.  LDL target less than 70 and preferably 55 or less. ?Smoking and cocaine cessation. ?Blood pressure and glycemic control. ?Cardiac rehab if possible ?  ?  ?Diagnostic ?Dominance: Right ?Intervention ?  ?_____________ ?  ?  ?TTE 01/31/21  Pending  but quick review by MD with normal EF and mild MR ?  ? ?EKG:  EKG is *** ordered today.  The ekg ordered today demonstrates *** ? ?Recent Labs: ?06/26/2020: TSH 1.570 ?01/30/2021: ALT 16 ?01/31/2021: Hemoglobin 13.3; Platelets 242 ?03/29/2021: BUN 21; Creatinine, Ser 1.18; Potassium 4.4; Sodium 136  ?Recent Lipid Panel ?   ?Component Value Date/Time  ? CHOL 169 01/24/2021 0954  ? CHOL 140 06/26/2020 0935  ? TRIG 288.0 (H) 01/24/2021 0954  ? HDL 29.00 (L) 01/24/2021 0954  ? HDL 29 (L) 06/26/2020 0935  ? CHOLHDL 6 01/24/2021 0954  ? VLDL 57.6 (H) 01/24/2021 0954  ? Page  11/14/2020 1522  ?   Comment:  ?   . ?LDL cholesterol not calculated. Triglyceride levels ?greater than 400 mg/dL invalidate calculated LDL results. ?. ?Reference range: <100 ?Marland Kitchen ?Desirable range <100 mg/dL for primary prevention;   ?<70 mg/dL for patients with CHD or diabetic patients  ?with > or = 2 CHD risk  factors. ?. ?LDL-C is now calculated using the Martin-Hopkins  ?calculation, which is a validated novel method providing  ?better accuracy than the Friedewald equation in the  ?estimation of LDL-C.  ?Cresenciano Genre et al. Annamaria Helling. 6568;127(51): 2061-2068  ?(http://education.[facility].com/faq/FAQ164) ?  ? LDLDIRECT 98.0 01/24/2021 0954  ? ? ? ?Risk Assessment/Calculations:   ?{Does this patient have ATRIAL FIBRILLATION?:[phone]} ? ?    ? ?Physical Exam:   ? ?VS:  There were no vitals taken for this visit.   ? ?Wt Readings from Last 3 Encounters:  ?04/12/21 184 lb 15.5 oz (83.9 kg)  ?04/09/21 183 lb 6.4 oz (83.2 kg)  ?04/05/21 186 lb (84.4 kg)  ?  ? ?GEN: *** Well nourished, well developed in no acute distress ?HEENT: Normal ?NECK: No JVD; No carotid bruits ?  LYMPHATICS: No lymphadenopathy ?CARDIAC: ***RRR, no murmurs, rubs, gallops ?RESPIRATORY:  Clear to auscultation without rales, wheezing or rhonchi  ?ABDOMEN: Soft, non-tender, non-distended ?MUSCULOSKELETAL:  No edema; No deformity  ?SKIN: Warm and dry ?NEUROLOGIC:  Alert and oriented x 3 ?PSYCHIATRIC:  Normal affect  ? ?ASSESSMENT:   ? ?No diagnosis found. ?PLAN:   ? ?In order of problems listed above: ? ?#Coronary Artery Disease: ?#Chest Pain: ?Patient presented with chest pain in 01/2021 concerning for UA. Coronary angiography on 01/30/21 found to have 95% mid-LAD disease; 70-80% D1, 70% prox-mid RCA stenosis and tandem 40% mid RCA stenosis. Now s/p PCI to mid-LAD with excellent angiographic results.  ?-S/p PCI to mLAD with excellent angiographic results ?-Continue ASA '81mg'$  daily, plavix '75mg'$  daily ?-Continue coreg 3.'125mg'$  BID ?-Continue crestor '40mg'$  daily ?-Continue lisinopril '20mg'$  daily ?-Tobacco and cocaine cessation ?-Cardiac Rehab ?  ?#HTN: ?-Continue coreg 3.'125mg'$  BID ?-Continue lisinopril '20mg'$  daily ?  ?#HLD: ?Goal LDL<55. ?-Continue crestor '20mg'$  daily ?-Repeat lipid panel in 6-8 weeks ?  ?#PAD: ?S/p atherectomy and stenting of the left SFA/popliteal  in 2018. ?-Continue ASA '81mg'$  daily and plavix '75mg'$  daily ?-Continue crestor as above ?-Tobacco and cocaine cessation ?  ?#HIV: ?-Continue home meds ?  ?#Tobacco Use, Cocaine use: ?-Encourage cessation ?  ? ?   ? ?{Are you orde

## 2021-04-18 ENCOUNTER — Encounter (HOSPITAL_COMMUNITY)
Admission: RE | Admit: 2021-04-18 | Discharge: 2021-04-18 | Disposition: A | Discharge status: Home / Self Care | Payer: No Typology Code available for payment source | Source: Ambulatory Visit | Attending: Cardiology | Admitting: Cardiology

## 2021-04-18 DIAGNOSIS — Z955 Presence of coronary angioplasty implant and graft: Secondary | ICD-10-CM

## 2021-04-18 DIAGNOSIS — Z48812 Encounter for surgical aftercare following surgery on the circulatory system: Secondary | ICD-10-CM | POA: Diagnosis not present

## 2021-04-18 LAB — GLUCOSE, CAPILLARY
Glucose-Capillary: 287 mg/dL — ABNORMAL HIGH (ref 70–99)
Glucose-Capillary: 269 mg/dL — ABNORMAL HIGH (ref 70–99)

## 2021-04-18 NOTE — Telephone Encounter (Signed)
Patient reported cardiac rehab going well and he enjoys it had initial visit and has follow ups scheduled.  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

## 2021-04-18 NOTE — Progress Notes (Signed)
Jacob Ali 61 y.o. male ?Nutrition Note: ?[name] is motivated to make lifestyle changes to aid with cardiac rehab and blood sugar control. He has medical history of HTN, CAD, atherosclerosis, GERD, DM2, Hyperlipidemia, B12 deficiency. He works full time. He reports concerns of high blood sugars. He has been successful managing blood sugar with diet in the past with more restrictive diets; however, difficulty maintaining no carb or low carb lifestyle long term. He does cook at home frequently and enjoys a variety of food.  ? ?76 Food Recall:  ?Breakfast: eggs + toast x2 OR biscuit with bacon or sausage, egg  ?Lunch: Ramen, beef, mashed potatoes, lima beans ?Dinner: fried shrimp, mac and cheese, hush puppies ?Snack: dark chocolate, yogurt + peaches + granola ? ?Lab Results  ?Component Value Date  ? HGBA1C 6.6 (A) 04/05/2021  ? ? ?Wt Readings from Last 3 Encounters:  ?04/12/21 184 lb 15.5 oz (83.9 kg)  ?04/09/21 183 lb 6.4 oz (83.2 kg)  ?04/05/21 186 lb (84.4 kg)  ? ? ?Nutrition Diagnosis ?Inappropriate intake of saturated fats related excessive consumption of fatty meats and processed foods as evidenced by pt's diet recall  ?Excessive carbohydrate intake related to consumption of convenience foods and sugary beverages as evidenced by A1C 5.7, triglycerides 288 ? ?Nutrition Intervention ?Pt?s individual nutrition plan reviewed with pt. ?Benefits of adopting Heart Healthy diet discussed when Medficts reviewed.   ?Continue client-centered nutrition education by RD, as part of interdisciplinary care. ? ?Goal(s) ?Reduce frequency of saturated fat intake including deep fried foods, processed meats (bacon,sausage,),etc.  ?Continue to work on following the plate method as a guide for meal planning to include non-starchy vegetables, complex carbohydrates, lean protein.  ?Limit simple sugars, refined carbohydrates(biscuits, hush puppies, etc), sugary drinks,etc. Look for products with <9 sugar per serving (yogurt, bars,  etc). Prioritize whole grains, fruit, complex carbohydrates.  ? ?Monitor/Evaluate.  ?[name] is motivated to make lifestyle changes to support cardiac rehab and blood sugar control. We discussed food labels/sugar intake, reducing fried foods/saturated fats, using the plate method as guide for meal planning, carbohydrate sources. Patient amicable to RD suggestions. Will follow-up as needed.  ? ?Plan:  ?Will provide client-centered nutrition education as part of interdisciplinary care ?Monitor and evaluate progress toward nutrition goal with team. ? ?Zella Dewan [place], MS, RDN, LDN  ?

## 2021-04-20 ENCOUNTER — Encounter (HOSPITAL_COMMUNITY)
Admission: RE | Admit: 2021-04-20 | Discharge: 2021-04-20 | Disposition: A | Discharge status: Home / Self Care | Payer: No Typology Code available for payment source | Source: Ambulatory Visit | Attending: Cardiology | Admitting: Cardiology

## 2021-04-20 ENCOUNTER — Ambulatory Visit (INDEPENDENT_AMBULATORY_CARE_PROVIDER_SITE_OTHER): Payer: No Typology Code available for payment source | Admitting: Cardiology

## 2021-04-20 ENCOUNTER — Other Ambulatory Visit: Payer: No Typology Code available for payment source

## 2021-04-20 ENCOUNTER — Encounter: Payer: Self-pay | Admitting: Cardiology

## 2021-04-20 VITALS — BP 116/58 | HR 77 | Ht 68.25 in | Wt 183.8 lb

## 2021-04-20 DIAGNOSIS — Z955 Presence of coronary angioplasty implant and graft: Secondary | ICD-10-CM

## 2021-04-20 DIAGNOSIS — F17200 Nicotine dependence, unspecified, uncomplicated: Secondary | ICD-10-CM

## 2021-04-20 DIAGNOSIS — I2510 Atherosclerotic heart disease of native coronary artery without angina pectoris: Secondary | ICD-10-CM

## 2021-04-20 DIAGNOSIS — E782 Mixed hyperlipidemia: Secondary | ICD-10-CM | POA: Diagnosis not present

## 2021-04-20 DIAGNOSIS — Z48812 Encounter for surgical aftercare following surgery on the circulatory system: Secondary | ICD-10-CM | POA: Diagnosis not present

## 2021-04-20 DIAGNOSIS — I739 Peripheral vascular disease, unspecified: Secondary | ICD-10-CM | POA: Diagnosis not present

## 2021-04-20 DIAGNOSIS — I70219 Atherosclerosis of native arteries of extremities with intermittent claudication, unspecified extremity: Secondary | ICD-10-CM

## 2021-04-20 DIAGNOSIS — F1491 Cocaine use, unspecified, in remission: Secondary | ICD-10-CM

## 2021-04-20 DIAGNOSIS — I10 Essential (primary) hypertension: Secondary | ICD-10-CM

## 2021-04-20 LAB — GLUCOSE, CAPILLARY: Glucose-Capillary: 206 mg/dL — ABNORMAL HIGH (ref 70–99)

## 2021-04-20 MED ORDER — DAPAGLIFLOZIN PROPANEDIOL 10 MG PO TABS: 10.0000 mg | ORAL_TABLET | Freq: Every day | ORAL | 6 refills | Status: DC

## 2021-04-20 NOTE — Progress Notes (Signed)
?Cardiology Office Note:   ? ?Date:  04/20/2021  ? ?ID:  Jacob Ali, DOB Oct 02, 1960, MRN [id number] ? ?PCP:  Pleas Koch, NP ?  ?[facility] Providers ?Cardiologist:  [name], MD { ? ?Referring MD: Pleas Koch, NP  ? ? ?History of Present Illness:   ? ?Jacob Ali is a 61 y.o. male with a hx of CAD s/p PCI to mid-LAD 01/2021, PAD, GERD, HIV, HLD, HTN, OSA, current smoker, and hx of colon cancer who presents to clinic for hospital follow-up. ? ?Patient was recently hospitalized in 01/2021 for chest pain with mild STD inferior and lateral leads. Trop 34>31. Cath showed 95% mLAD lesion s/p PCI with excellent results.  ? ?Today, he reports he is doing well. His blood pressure is well controlled and at goal.  ?He is actively trying to manage his blood sugars. They are always in the 200's. Notes he had a piece of toast and his blood sugar was above 200. He tried taking 1000 mg metformin yesterday morning but he had a reaction it with significant nausea and HA. No hypoglycemia at that time. ?He is going to cardiac rehab where he is doing well. He is having mild claudication symptoms but is able to walk around the track 14 times. He was previously followed by Vascular out in [place] but is looking to switch to [place]. ?He is using a nicotine patch and is working on cutting back on smoking.  ?He denies chest pain, shortness of breath, palpitations, lightheadedness, headaches, syncope, LE edema, orthopnea, PND.  ? ?Past Medical History:  ?Diagnosis Date  ? CAD in native artery 01/31/2021  ? Depression   ? GERD (gastroesophageal reflux disease)   ? HIV infection ([place])   ? Hyperlipidemia   ? Hypertension   ? Peripheral vascular disease ([place])   ? PONV (postoperative nausea and vomiting)   ? S/P angioplasty with stent 01/30/21 to LAD  01/31/2021  ? Sleep apnea   ? Subdural hemorrhage ([place])   ? Tubular adenoma of colon 11/03/2017  ? Colonoscopy Sept 9, 2019  ? ? ?Past Surgical History:   ?Procedure Laterality Date  ? CARDIAC CATHETERIZATION    ? CORONARY STENT INTERVENTION N/A 01/30/2021  ? Procedure: CORONARY STENT INTERVENTION;  Surgeon: Belva Crome, MD;  Location: [facility];  Service: Cardiovascular;  Laterality: N/A;  ? Examination under anesthesia, repair of anal fissure,  12/04/2000  ? LEFT HEART CATH AND CORONARY ANGIOGRAPHY N/A 01/30/2021  ? Procedure: LEFT HEART CATH AND CORONARY ANGIOGRAPHY;  Surgeon: Belva Crome, MD;  Location: [facility];  Service: Cardiovascular;  Laterality: N/A;  ? PERIPHERAL VASCULAR CATHETERIZATION Left 02/06/2016  ? Procedure: Lower Extremity Angiography;  Surgeon: Katha Cabal, MD;  Location: [facility];  Service: Cardiovascular;  Laterality: Left;  ? PERIPHERAL VASCULAR CATHETERIZATION N/A 02/06/2016  ? Procedure: Abdominal Aortogram w/Lower Extremity;  Surgeon: Katha Cabal, MD;  Location: [facility];  Service: Cardiovascular;  Laterality: N/A;  ? PERIPHERAL VASCULAR CATHETERIZATION  02/06/2016  ? Procedure: Lower Extremity Intervention;  Surgeon: Katha Cabal, MD;  Location: [facility];  Service: Cardiovascular;;  ? ? ?Current Medications: ?Current Meds  ?Medication Sig  ? ACCU-CHEK GUIDE test strip USE UP TO 4 TIMES A DAY AS DIRECTED  ? Accu-Chek Softclix Lancets lancets USE UP TO 4 TIMES A DAY AS DIRECTED  ? acetaminophen (TYLENOL) 500 MG tablet Take 1,000 mg by mouth every 6 (six)  hours as needed for moderate pain.  ? aspirin 81 MG chewable tablet Chew 1 tablet (81 mg total) by mouth daily.  ? bictegravir-emtricitabine-tenofovir AF (BIKTARVY) 50-200-25 MG TABS tablet TAKE 1 TABLET BY MOUTH DAILY.  ? carvedilol (COREG) 6.25 MG tablet Take 1 tablet (6.25 mg total) by mouth 2 (two) times daily with a meal.  ? clopidogrel (PLAVIX) 75 MG tablet TAKE 1 TABLET BY MOUTH EVERY DAY  ? dapagliflozin propanediol (FARXIGA) 10 MG TABS tablet Take 1 tablet (10 mg total) by mouth daily before breakfast.  ?  diphenhydramine-acetaminophen (TYLENOL PM) 25-500 MG TABS tablet Take 1 tablet by mouth at bedtime as needed (sleep).  ? fluticasone (FLONASE) 50 MCG/ACT nasal spray Place 1 spray into both nostrils 2 (two) times daily.  ? lisinopril-hydrochlorothiazide (ZESTORETIC) 20-25 MG tablet Take 1 tablet by mouth daily.  ? metFORMIN (GLUCOPHAGE-XR) 500 MG 24 hr tablet Take 1 tablet (500 mg total) by mouth daily with breakfast. For diabetes.  ? nicotine (NICODERM CQ - DOSED IN MG/24 HOURS) 21 mg/24hr patch Place 1 patch (21 mg total) onto the skin daily.  ? nitroGLYCERIN (NITROSTAT) 0.4 MG SL tablet Place 1 tablet (0.4 mg total) under the tongue every 5 (five) minutes x 3 doses as needed for chest pain.  ? pantoprazole (PROTONIX) 40 MG tablet Take 1 tablet (40 mg total) by mouth daily. For heartburn.  ? rosuvastatin (CRESTOR) 40 MG tablet Take 1 tablet (40 mg total) by mouth daily.  ? temazepam (RESTORIL) 7.5 MG capsule TAKE 1 CAPSULE BY MOUTH AT BEDTIME AS NEEDED FOR SLEEP  ?  ? ?Allergies:   Atorvastatin and Tamiflu [oseltamivir phosphate]  ? ?Social History  ? ?Socioeconomic History  ? Marital status: Single  ?  Spouse name: Not on file  ? Number of children: Not on file  ? Years of education: 12  ? Highest education level: High school graduate  ?Occupational History  ? Occupation: Art gallery manager  ?  Employer: [employer]  ?Tobacco Use  ? Smoking status: Every Day  ?  Packs/day: 0.50  ?  Years: 40.00  ?  Pack years: 20.00  ?  Types: Cigars, Cigarettes  ? Smokeless tobacco: Never  ? Tobacco comments:  ?  1/2PPD Mr Blitch was wearing a nicotine patch today and says he plans to quit smoking. Given the phone number to 1-800=quit-now   ?Vaping Use  ? Vaping Use: Former  ?Substance and Sexual Activity  ? Alcohol use: Yes  ?  Alcohol/week: 2.0 standard drinks  ?  Types: 2 Standard drinks or equivalent per week  ?  Comment: 1-2 times a week  ? Drug use: Not Currently  ?  Types: Marijuana  ?  Comment: 2 per month  ? Sexual  activity: Not Currently  ?  Partners: Male  ?  Birth control/protection: Condom  ?  Comment: pt. given condoms  ?Other Topics Concern  ? Not on file  ?Social History Narrative  ? Single, lives alone.    ? ?Social Determinants of Health  ? ?Financial Resource Strain: Not on file  ?Food Insecurity: Not on file  ?Transportation Needs: Not on file  ?Physical Activity: Not on file  ?Stress: Not on file  ?Social Connections: Not on file  ?  ? ?Family History: ?The patient's family history includes Asthma in his mother; Cancer in his father and maternal grandmother; Heart disease in his father; Hypertension in his mother; Throat cancer in his father. ? ?ROS:   ?Please see the history of  present illness.    ? All other systems reviewed and are negative. ? ?EKGs/Labs/Other Studies Reviewed:   ? ?The following studies were reviewed today: ?Cardiac cath 01/30/21 ?CONCLUSIONS: ?95% mid LAD.  First diagonal 70 to 80% ostial narrowing. ?Successful LAD stent with reduction in stenosis to 0% with TIMI grade III flow. ?Left main widely patent ?Circumflex with luminal irregularities ?Right coronary with eccentric 70% proximal to mid stenosis and tandem 40% mid stenosis. ?Normal LV function.  LVEDP 8 mmHg.  EF 55%. ?  ?RECOMMENDATIONS: ?  ?Aggressive lipid-lowering.  LDL target less than 70 and preferably 55 or less. ?Smoking and cocaine cessation. ?Blood pressure and glycemic control. ?Cardiac rehab if possible ?  ?  ?Diagnostic ?Dominance: Right ?Intervention ?  ?_____________ ?  ?  ?TTE 01/31/21  Pending  but quick review by MD with normal EF and mild MR ?  ? ?EKG:  EKG is not ordered today.  ? ?Recent Labs: ?06/26/2020: TSH 1.570 ?01/30/2021: ALT 16 ?01/31/2021: Hemoglobin 13.3; Platelets 242 ?03/29/2021: BUN 21; Creatinine, Ser 1.18; Potassium 4.4; Sodium 136  ?Recent Lipid Panel ?   ?Component Value Date/Time  ? CHOL 169 01/24/2021 0954  ? CHOL 140 06/26/2020 0935  ? TRIG 288.0 (H) 01/24/2021 0954  ? HDL 29.00 (L) 01/24/2021 0954  ?  HDL 29 (L) 06/26/2020 0935  ? CHOLHDL 6 01/24/2021 0954  ? VLDL 57.6 (H) 01/24/2021 0954  ? [place]  11/14/2020 1522  ?   Comment:  ?   . ?LDL cholesterol not calculated. Triglyceride levels ?greater than 400 mg

## 2021-04-20 NOTE — Patient Instructions (Signed)
Medication Instructions:  ? ?START TAKING FARXIGA 10 MG BY MOUTH DAILY BEFORE BREAKFAST  ? ?*If you need a refill on your cardiac medications before your next appointment, please call your pharmacy* ? ? ?You have been referred to SEE DR. Fletcher Anon AT [facility]SULT FOR PAD ? ? ?Follow-Up: ?At [facility], you and your health needs are our priority.  As part of our continuing mission to provide you with exceptional heart care, we have created designated Provider Care Teams.  These Care Teams include your primary Cardiologist (physician) and Advanced Practice Providers (APPs -  Physician Assistants and Nurse Practitioners) who all work together to provide you with the care you need, when you need it. ? ?We recommend signing up for the patient portal called "MyChart".  Sign up information is provided on this After Visit Summary.  MyChart is used to connect with patients for Virtual Visits (Telemedicine).  Patients are able to view lab/test results, encounter notes, upcoming appointments, etc.  Non-urgent messages can be sent to your provider as well.   ?To learn more about what you can do with MyChart, go to [email or web address].   ? ?Your next appointment:   ?6 month(s) ? ?The format for your next appointment:   ?In Person ? ?Provider:   ?[name], MD { ? ? ?

## 2021-04-21 ENCOUNTER — Encounter (INDEPENDENT_AMBULATORY_CARE_PROVIDER_SITE_OTHER): Payer: Self-pay | Admitting: Nurse Practitioner

## 2021-04-21 NOTE — Progress Notes (Signed)
? ?Subjective:  ? ? Patient ID: Jacob Ali, male    DOB: October 03, 1960, 61 y.o.   MRN: [id number] ?Chief Complaint  ?Patient presents with  ? Follow-up  ?  ultrasound  ? ? ?Jacob Ali is a 61 year old male presents today for follow-up evaluation of peripheral arterial disease.  The patient has noted back and hip pain and this is not much changed from his previous endovascular intervention.  He recently underwent cardiac catheterization in January and notes that he will be beginning cardiac rehab soon.  He denies any rest pains or open wounds or ulcerations. ? ?Today noninvasive studies show an ABI 0.88 on the right and 0.72 on the left.  Previously they were 1.05 bilaterally.  The patient has monophasic tibial artery waveforms bilaterally.  Left lower extremity notes that there are elevated velocities in the left SFA indicative of a likely greater than 50% stenosis. ? ? ?Review of Systems  ?Musculoskeletal:  Positive for back pain.  ?All other systems reviewed and are negative. ? ?   ?Objective:  ? Physical Exam ?Vitals reviewed.  ?HENT:  ?   Head: Normocephalic.  ?Cardiovascular:  ?   Rate and Rhythm: Normal rate.  ?   Pulses: Decreased pulses.  ?Pulmonary:  ?   Effort: Pulmonary effort is normal.  ?Skin: ?   General: Skin is warm and dry.  ?Neurological:  ?   Mental Status: He is alert and oriented to person, place, and time.  ?Psychiatric:     ?   Mood and Affect: Mood normal.     ?   Behavior: Behavior normal.     ?   Thought Content: Thought content normal.     ?   Judgment: Judgment normal.  ? ? ?BP 136/73 (BP Location: Right Arm)   Pulse 78   Resp 17   Ht '5\' 9"'$  (1.753 m)   Wt 183 lb 6.4 oz (83.2 kg)   BMI 27.08 kg/m?  ? ?Past Medical History:  ?Diagnosis Date  ? CAD in native artery 01/31/2021  ? Depression   ? GERD (gastroesophageal reflux disease)   ? HIV infection ([place])   ? Hyperlipidemia   ? Hypertension   ? Peripheral vascular disease ([place])   ? PONV (postoperative nausea and vomiting)   ? S/P  angioplasty with stent 01/30/21 to LAD  01/31/2021  ? Sleep apnea   ? Subdural hemorrhage ([place])   ? Tubular adenoma of colon 11/03/2017  ? Colonoscopy Sept 9, 2019  ? ? ?Social History  ? ?Socioeconomic History  ? Marital status: Single  ?  Spouse name: Not on file  ? Number of children: Not on file  ? Years of education: 33  ? Highest education level: High school graduate  ?Occupational History  ? Occupation: Art gallery manager  ?  Employer: [employer]  ?Tobacco Use  ? Smoking status: Every Day  ?  Packs/day: 0.50  ?  Years: 40.00  ?  Pack years: 20.00  ?  Types: Cigars, Cigarettes  ? Smokeless tobacco: Never  ? Tobacco comments:  ?  1/2PPD Jacob Ali was wearing a nicotine patch today and says he plans to quit smoking. Given the phone number to 1-800=quit-now   ?Vaping Use  ? Vaping Use: Former  ?Substance and Sexual Activity  ? Alcohol use: Yes  ?  Alcohol/week: 2.0 standard drinks  ?  Types: 2 Standard drinks or equivalent per week  ?  Comment: 1-2 times a week  ? Drug use: Not  Currently  ?  Types: Marijuana  ?  Comment: 2 per month  ? Sexual activity: Not Currently  ?  Partners: Male  ?  Birth control/protection: Condom  ?  Comment: pt. given condoms  ?Other Topics Concern  ? Not on file  ?Social History Narrative  ? Single, lives alone.    ? ?Social Determinants of Health  ? ?Financial Resource Strain: Not on file  ?Food Insecurity: Not on file  ?Transportation Needs: Not on file  ?Physical Activity: Not on file  ?Stress: Not on file  ?Social Connections: Not on file  ?Intimate Partner Violence: Not on file  ? ? ?Past Surgical History:  ?Procedure Laterality Date  ? CARDIAC CATHETERIZATION    ? CORONARY STENT INTERVENTION N/A 01/30/2021  ? Procedure: CORONARY STENT INTERVENTION;  Surgeon: Belva Crome, MD;  Location: [facility];  Service: Cardiovascular;  Laterality: N/A;  ? Examination under anesthesia, repair of anal fissure,  12/04/2000  ? LEFT HEART CATH AND CORONARY ANGIOGRAPHY N/A 01/30/2021  ?  Procedure: LEFT HEART CATH AND CORONARY ANGIOGRAPHY;  Surgeon: Belva Crome, MD;  Location: [facility];  Service: Cardiovascular;  Laterality: N/A;  ? PERIPHERAL VASCULAR CATHETERIZATION Left 02/06/2016  ? Procedure: Lower Extremity Angiography;  Surgeon: Katha Cabal, MD;  Location: [facility];  Service: Cardiovascular;  Laterality: Left;  ? PERIPHERAL VASCULAR CATHETERIZATION N/A 02/06/2016  ? Procedure: Abdominal Aortogram w/Lower Extremity;  Surgeon: Katha Cabal, MD;  Location: [facility];  Service: Cardiovascular;  Laterality: N/A;  ? PERIPHERAL VASCULAR CATHETERIZATION  02/06/2016  ? Procedure: Lower Extremity Intervention;  Surgeon: Katha Cabal, MD;  Location: [facility];  Service: Cardiovascular;;  ? ? ?Family History  ?Problem Relation Age of Onset  ? Hypertension Mother   ? Asthma Mother   ? Throat cancer Father   ? Heart disease Father   ? Cancer Father   ?     bladder  ? Cancer Maternal Grandmother   ?     colon  ? ? ?Allergies  ?Allergen Reactions  ? Atorvastatin Other (See Comments)  ?  myalgias  ? Tamiflu [Oseltamivir Phosphate] Other (See Comments)  ?  Depressive symptoms  ? ? ? ?  Latest Ref Rng & Units 01/31/2021  ? 12:55 AM 01/30/2021  ?  8:40 PM 01/30/2021  ?  4:03 PM  ?CBC  ?WBC 4.0 - 10.5 K/uL 8.9   7.8   9.1    ?Hemoglobin 13.0 - 17.0 g/dL 13.3   12.9   14.3    ?Hematocrit 39.0 - 52.0 % 38.8   39.3   42.2    ?Platelets 150 - 400 K/uL 242   229   262    ? ? ? ? ?CMP  ?   ?Component Value Date/Time  ? NA 136 03/29/2021 1450  ? K 4.4 03/29/2021 1450  ? CL 100 03/29/2021 1450  ? CO2 20 03/29/2021 1450  ? GLUCOSE 255 (H) 03/29/2021 1450  ? GLUCOSE 166 (H) 01/31/2021 0055  ? BUN 21 03/29/2021 1450  ? CREATININE 1.18 03/29/2021 1450  ? CREATININE 0.97 11/14/2020 1522  ? CALCIUM 9.8 03/29/2021 1450  ? PROT 7.0 01/30/2021 0924  ? PROT 7.3 06/26/2020 0935  ? ALBUMIN 4.5 01/30/2021 0924  ? ALBUMIN 5.1 (H) 06/26/2020 0935  ? AST 14 (L) 01/30/2021 0924   ? ALT 16 01/30/2021 0924  ? ALKPHOS 48 01/30/2021 0924  ? BILITOT 0.4 01/30/2021 0924  ?  BILITOT 0.5 06/26/2020 0935  ? GFRNONAA >60 01/31/2021 0055  ? GFRNONAA >60 10/16/2010 1641  ? GFRAA >60 05/22/2017 1222  ? GFRAA >60 10/16/2010 1641  ? ? ? ?No results found. ? ?   ?Assessment & Plan:  ? ?1. PAD (peripheral artery disease) ([place]) ?The patient has had a deterioration in his ABIs.  He does note some worsening claudication.  At this time however his symptoms are not limb threatening.  He is not ready to proceed with any intervention.  He is currently going through cardiac rehab and this may indeed help with his claudication symptoms.  We will have the patient follow-up in 3 months with noninvasive studies or sooner if issues arise. ? ?2. Tobacco use disorder ?Currently working on smoking cessation by using patches.  Patient is encouraged ? ?3. Essential (primary) hypertension ?Continue antihypertensive medications as already ordered, these medications have been reviewed and there are no changes at this time.  ? ?4. Mixed hyperlipidemia ?Continue statin as ordered and reviewed, no changes at this time  ? ? ?Current Outpatient Medications on File Prior to Visit  ?Medication Sig Dispense Refill  ? ACCU-CHEK GUIDE test strip USE UP TO 4 TIMES A DAY AS DIRECTED 100 strip 5  ? Accu-Chek Softclix Lancets lancets USE UP TO 4 TIMES A DAY AS DIRECTED 100 each 5  ? aspirin 81 MG chewable tablet Chew 1 tablet (81 mg total) by mouth daily.    ? bictegravir-emtricitabine-tenofovir AF (BIKTARVY) 50-200-25 MG TABS tablet TAKE 1 TABLET BY MOUTH DAILY. 30 tablet 6  ? carvedilol (COREG) 6.25 MG tablet Take 1 tablet (6.25 mg total) by mouth 2 (two) times daily with a meal. 60 tablet 2  ? fluticasone (FLONASE) 50 MCG/ACT nasal spray Place 1 spray into both nostrils 2 (two) times daily. 48 g 1  ? lisinopril-hydrochlorothiazide (ZESTORETIC) 20-25 MG tablet Take 1 tablet by mouth daily. 90 tablet 3  ? metFORMIN (GLUCOPHAGE-XR) 500 MG 24  hr tablet Take 1 tablet (500 mg total) by mouth daily with breakfast. For diabetes. (Patient taking differently: Take 500 mg by mouth daily with breakfast. For diabetes. ?4/5 advised by PCP - Ninfa Linden

## 2021-04-22 ENCOUNTER — Encounter: Payer: Self-pay | Admitting: Cardiology

## 2021-04-23 ENCOUNTER — Encounter (HOSPITAL_COMMUNITY)
Admission: RE | Admit: 2021-04-23 | Discharge: 2021-04-23 | Disposition: A | Discharge status: Home / Self Care | Payer: No Typology Code available for payment source | Source: Ambulatory Visit | Attending: Cardiology | Admitting: Cardiology

## 2021-04-23 DIAGNOSIS — Z955 Presence of coronary angioplasty implant and graft: Secondary | ICD-10-CM

## 2021-04-23 DIAGNOSIS — Z48812 Encounter for surgical aftercare following surgery on the circulatory system: Secondary | ICD-10-CM | POA: Diagnosis not present

## 2021-04-24 ENCOUNTER — Other Ambulatory Visit: Payer: No Typology Code available for payment source | Admitting: *Deleted

## 2021-04-24 DIAGNOSIS — I70219 Atherosclerosis of native arteries of extremities with intermittent claudication, unspecified extremity: Secondary | ICD-10-CM

## 2021-04-24 DIAGNOSIS — I10 Essential (primary) hypertension: Secondary | ICD-10-CM

## 2021-04-24 DIAGNOSIS — E782 Mixed hyperlipidemia: Secondary | ICD-10-CM

## 2021-04-24 DIAGNOSIS — I2510 Atherosclerotic heart disease of native coronary artery without angina pectoris: Secondary | ICD-10-CM

## 2021-04-24 LAB — COMPREHENSIVE METABOLIC PANEL
ALT: 11 IU/L (ref 0–44)
AST: 14 IU/L (ref 0–40)
Albumin/Globulin Ratio: 2.6 — ABNORMAL HIGH (ref 1.2–2.2)
Albumin: 5 g/dL — ABNORMAL HIGH (ref 3.8–4.8)
Alkaline Phosphatase: 67 IU/L (ref 44–121)
BUN/Creatinine Ratio: 23 (ref 10–24)
BUN: 32 mg/dL — ABNORMAL HIGH (ref 8–27)
Bilirubin Total: 0.8 mg/dL (ref 0.0–1.2)
CO2: 21 mmol/L (ref 20–29)
Calcium: 9.9 mg/dL (ref 8.6–10.2)
Chloride: 99 mmol/L (ref 96–106)
Creatinine, Ser: 1.37 mg/dL — ABNORMAL HIGH (ref 0.76–1.27)
Globulin, Total: 1.9 g/dL (ref 1.5–4.5)
Glucose: 206 mg/dL — ABNORMAL HIGH (ref 70–99)
Potassium: 4.4 mmol/L (ref 3.5–5.2)
Sodium: 134 mmol/L (ref 134–144)
Total Protein: 6.9 g/dL (ref 6.0–8.5)
eGFR: 58 mL/min/{1.73_m2} — ABNORMAL LOW (ref >59)

## 2021-04-24 LAB — LIPID PANEL
Chol/HDL Ratio: 4.6 ratio (ref 0.0–5.0)
Cholesterol, Total: 115 mg/dL (ref 100–199)
HDL: 25 mg/dL — ABNORMAL LOW (ref >39)
LDL Chol Calc (NIH): 57 mg/dL (ref 0–99)
Triglycerides: 200 mg/dL — ABNORMAL HIGH (ref 0–149)
VLDL Cholesterol Cal: 33 mg/dL (ref 5–40)

## 2021-04-24 NOTE — Progress Notes (Signed)
Cardiac Individual Treatment Plan ? ?Patient Details  ?Name: Jacob Ali ?MRN: [id number] ?Date of Birth: 1960/03/24 ?Referring Provider:   ?Flowsheet Row CARDIAC REHAB PHASE II ORIENTATION from 04/12/2021 in [facility]  ?Referring Provider Dr. Gwyndolyn Kaufman, MD  ? ?  ? ? ?Initial Encounter Date:  ?Flowsheet Row CARDIAC REHAB PHASE II ORIENTATION from 04/12/2021 in [facility]  ?Date 04/12/21  ? ?  ? ? ?Visit Diagnosis: 01/30/21 S/P DES LAD ? ?Patient's Home Medications on Admission: ? ?Current Outpatient Medications:  ?  ACCU-CHEK GUIDE test strip, USE UP TO 4 TIMES A DAY AS DIRECTED, Disp: 100 strip, Rfl: 5 ?  Accu-Chek Softclix Lancets lancets, USE UP TO 4 TIMES A DAY AS DIRECTED, Disp: 100 each, Rfl: 5 ?  acetaminophen (TYLENOL) 500 MG tablet, Take 1,000 mg by mouth every 6 (six) hours as needed for moderate pain., Disp: , Rfl:  ?  aspirin 81 MG chewable tablet, Chew 1 tablet (81 mg total) by mouth daily., Disp: , Rfl:  ?  bictegravir-emtricitabine-tenofovir AF (BIKTARVY) 50-200-25 MG TABS tablet, TAKE 1 TABLET BY MOUTH DAILY., Disp: 30 tablet, Rfl: 6 ?  carvedilol (COREG) 6.25 MG tablet, Take 1 tablet (6.25 mg total) by mouth 2 (two) times daily with a meal., Disp: 60 tablet, Rfl: 2 ?  clopidogrel (PLAVIX) 75 MG tablet, TAKE 1 TABLET BY MOUTH EVERY DAY, Disp: 90 tablet, Rfl: 2 ?  dapagliflozin propanediol (FARXIGA) 10 MG TABS tablet, Take 1 tablet (10 mg total) by mouth daily before breakfast., Disp: 30 tablet, Rfl: 6 ?  diphenhydramine-acetaminophen (TYLENOL PM) 25-500 MG TABS tablet, Take 1 tablet by mouth at bedtime as needed (sleep)., Disp: , Rfl:  ?  fluticasone (FLONASE) 50 MCG/ACT nasal spray, Place 1 spray into both nostrils 2 (two) times daily., Disp: 48 g, Rfl: 1 ?  lisinopril-hydrochlorothiazide (ZESTORETIC) 20-25 MG tablet, Take 1 tablet by mouth daily., Disp: 90 tablet, Rfl: 3 ?  metFORMIN (GLUCOPHAGE-XR) 500 MG 24 hr tablet, Take 1  tablet (500 mg total) by mouth daily with breakfast. For diabetes. (Patient taking differently: Take 500 mg by mouth daily with breakfast. For diabetes. 4/5 advised by PCP - Alma Friendly NP to increase Metformin to 500 mg twice a day.), Disp: 90 tablet, Rfl: 0 ?  nicotine (NICODERM CQ - DOSED IN MG/24 HOURS) 21 mg/24hr patch, Place 1 patch (21 mg total) onto the skin daily., Disp: 28 patch, Rfl: 0 ?  nitroGLYCERIN (NITROSTAT) 0.4 MG SL tablet, Place 1 tablet (0.4 mg total) under the tongue every 5 (five) minutes x 3 doses as needed for chest pain., Disp: 25 tablet, Rfl: 4 ?  pantoprazole (PROTONIX) 40 MG tablet, Take 1 tablet (40 mg total) by mouth daily. For heartburn., Disp: 90 tablet, Rfl: 3 ?  rosuvastatin (CRESTOR) 40 MG tablet, Take 1 tablet (40 mg total) by mouth daily., Disp: 30 tablet, Rfl: 6 ?  temazepam (RESTORIL) 7.5 MG capsule, TAKE 1 CAPSULE BY MOUTH AT BEDTIME AS NEEDED FOR SLEEP, Disp: 30 capsule, Rfl: 0 ?  temazepam (RESTORIL) 7.5 MG capsule, Take 1 capsule (7.5 mg total) by mouth at bedtime as needed for sleep., Disp: 30 capsule, Rfl: 0 ? ?Past Medical History: ?Past Medical History:  ?Diagnosis Date  ? CAD in native artery 01/31/2021  ? Depression   ? GERD (gastroesophageal reflux disease)   ? HIV infection ([place])   ? Hyperlipidemia   ? Hypertension   ? Peripheral vascular disease ([place])   ?  PONV (postoperative nausea and vomiting)   ? S/P angioplasty with stent 01/30/21 to LAD  01/31/2021  ? Sleep apnea   ? Subdural hemorrhage ([place])   ? Tubular adenoma of colon 11/03/2017  ? Colonoscopy Sept 9, 2019  ? ? ?Tobacco Use: ?Social History  ? ?Tobacco Use  ?Smoking Status Every Day  ? Packs/day: 0.50  ? Years: 40.00  ? Pack years: 20.00  ? Types: Cigars, Cigarettes  ?Smokeless Tobacco Never  ?Tobacco Comments  ? 1/2PPD Mr Hackman was wearing a nicotine patch today and says he plans to quit smoking. Given the phone number to 1-800=quit-now   ? ? ?Labs: ?Review Flowsheet   ? ?  ?  Latest Ref Rng & Units  09/13/2020 11/14/2020 01/24/2021 04/05/2021  ?Labs for ITP Cardiac and Pulmonary Rehab  ?Cholestrol 100 - 199 mg/dL 207   162   169     ?LDL (calc) 0 - 99 mg/dL      ?Direct LDL mg/dL 124.0    98.0     ?HDL-C >39 mg/dL 30.60   25   29.00     ?Trlycerides 0 - 149 mg/dL 379.0   571   288.0     ?Hemoglobin A1c 4.0 - 5.6 %   5.7   6.6    ? ?  04/24/2021  ?Labs for ITP Cardiac and Pulmonary Rehab  ?Cholestrol 115    ?LDL (calc) 57    ?Direct LDL   ?HDL-C 25    ?Trlycerides 200    ?Hemoglobin A1c   ?  ? ? Multiple values from one day are sorted in reverse-chronological order  ?  ?  ? ? ?Capillary Blood Glucose: ?Lab Results  ?Component Value Date  ? GLUCAP 206 (H) 04/20/2021  ? GLUCAP 269 (H) 04/18/2021  ? GLUCAP 287 (H) 04/18/2021  ? GLUCAP 240 (H) 04/16/2021  ? GLUCAP 290 (H) 04/16/2021  ? ? ? ?Exercise Target Goals: ?Exercise Program Goal: ?Individual exercise prescription set using results from initial 6 min walk test and THRR while considering  patient?s activity barriers and safety.  ? ?Exercise Prescription Goal: ?Initial exercise prescription builds to 30-45 minutes a day of aerobic activity, 2-3 days per week.  Home exercise guidelines will be given to patient during program as part of exercise prescription that the participant will acknowledge. ? ?Activity Barriers & Risk Stratification: ? Activity Barriers & Cardiac Risk Stratification - 04/12/21 1009   ? ?  ? Activity Barriers & Cardiac Risk Stratification  ? Activity Barriers Deconditioning   ? Cardiac Risk Stratification High   ? ?  ?  ? ?  ? ? ?6 Minute Walk: ? 6 Minute Walk   ? ? [place] Name 04/12/21 1001  ?  ?  ?  ? 6 Minute Walk  ? Phase Initial    ? Distance 1200 feet    ? Walk Time 6 minutes    ? # of Rest Breaks 0    ? MPH 2.27    ? METS 2.94    ? RPE 10    ? Perceived Dyspnea  0    ? VO2 Peak 10.27    ? Symptoms Yes (comment)    ? Comments 3.20 mins in, c/o tightness in leg from claudication. 7/10 pain last lap bilate hip and left leg    ? Resting HR 82 bpm     ? Resting BP 118/74    ? Resting Oxygen Saturation  95 %    ? Exercise Oxygen  Saturation  during 6 min walk 96 %    ? Max Ex. HR 90 bpm    ? Max Ex. BP 112/62    ? 2 Minute Post BP 114/60    ? ?  ?  ? ?  ? ? ?Oxygen Initial Assessment: ? ? ?Oxygen Re-Evaluation: ? ? ?Oxygen Discharge (Final Oxygen Re-Evaluation): ? ? ?Initial Exercise Prescription: ? Initial Exercise Prescription - 04/12/21 1000   ? ?  ? Date of Initial Exercise RX and Referring Provider  ? Date 04/12/21   ? Referring Provider Dr. Gwyndolyn Kaufman, MD   ? Expected Discharge Date 06/08/21   ?  ? NuStep  ? Level 2   ? SPM 80   ? Minutes 15   ? METs 2   ?  ? Track  ? Laps 10   ? Minutes 15   ? METs 2.16   ?  ? Prescription Details  ? Frequency (times per week) 3   ? Duration Progress to 30 minutes of continuous aerobic without signs/symptoms of physical distress   ?  ? Intensity  ? THRR 40-80% of Max Heartrate 63-126   ? Ratings of Perceived Exertion 11-13   ? Perceived Dyspnea 0-4   ?  ? Progression  ? Progression Continue progressive overload as per policy without signs/symptoms or physical distress.   ?  ? Resistance Training  ? Training Prescription Yes   ? Weight 3   ? Reps 10-15   ? ?  ?  ? ?  ? ? ?Perform Capillary Blood Glucose checks as needed. ? ?Exercise Prescription Changes: ? ? Exercise Prescription Changes   ? ? [place] Name 04/16/21 0830  ?  ?  ?  ?  ?  ? Response to Exercise  ? Blood Pressure (Admit) 112/60      ? Blood Pressure (Exercise) 124/60      ? Blood Pressure (Exit) 108/68      ? Heart Rate (Admit) 85 bpm      ? Heart Rate (Exercise) 94 bpm      ? Heart Rate (Exit) 88 bpm      ? Rating of Perceived Exertion (Exercise) 11      ? Perceived Dyspnea (Exercise) 0      ? Symptoms Claudication pain 7/10 relieved with rest      ? Comments Pt first day in the CRP2 program      ? Duration Progress to 30 minutes of  aerobic without signs/symptoms of physical distress      ? Intensity THRR unchanged      ?  ? Progression  ? Progression  Continue to progress workloads to maintain intensity without signs/symptoms of physical distress.      ? Average METs 2.63      ?  ? Resistance Training  ? Training Prescription Yes      ? Weight 3      ? R

## 2021-04-25 ENCOUNTER — Telehealth: Payer: Self-pay | Admitting: *Deleted

## 2021-04-25 ENCOUNTER — Encounter (HOSPITAL_COMMUNITY)
Admission: RE | Admit: 2021-04-25 | Discharge: 2021-04-25 | Disposition: A | Discharge status: Home / Self Care | Payer: No Typology Code available for payment source | Source: Ambulatory Visit | Attending: Cardiology | Admitting: Cardiology

## 2021-04-25 ENCOUNTER — Other Ambulatory Visit (HOSPITAL_COMMUNITY): Payer: Self-pay

## 2021-04-25 DIAGNOSIS — Z955 Presence of coronary angioplasty implant and graft: Secondary | ICD-10-CM

## 2021-04-25 DIAGNOSIS — I10 Essential (primary) hypertension: Secondary | ICD-10-CM

## 2021-04-25 DIAGNOSIS — Z48812 Encounter for surgical aftercare following surgery on the circulatory system: Secondary | ICD-10-CM | POA: Diagnosis not present

## 2021-04-25 NOTE — Telephone Encounter (Signed)
-----   Message from Liliane Shi, [place] sent at 04/24/2021 10:37 PM EDT ----- ?Triglycerides are elevated somewhat.  LDL is at goal.   ?Glucose elevated.  Creatinine elevated.  K+ normal.  Albumin mildly elevated. LFTs normal.  ?PLAN:  ?-Continue current dose of Rosuvastatin. Work on diet to lower triglycerides. ?-Push fluids, drink plenty of water. ?-Repeat BMET 1 week ?-F/u with PCP for diabetes, elevated albumin  ?Richardson Dopp, PA-C    ?04/24/2021 10:16 PM   ?

## 2021-04-27 ENCOUNTER — Encounter (HOSPITAL_COMMUNITY)
Admission: RE | Admit: 2021-04-27 | Discharge: 2021-04-27 | Disposition: A | Discharge status: Home / Self Care | Payer: No Typology Code available for payment source | Source: Ambulatory Visit | Attending: Cardiology | Admitting: Cardiology

## 2021-04-27 DIAGNOSIS — Z48812 Encounter for surgical aftercare following surgery on the circulatory system: Secondary | ICD-10-CM | POA: Diagnosis not present

## 2021-04-27 DIAGNOSIS — Z955 Presence of coronary angioplasty implant and graft: Secondary | ICD-10-CM

## 2021-04-30 ENCOUNTER — Other Ambulatory Visit: Payer: Self-pay | Admitting: Primary Care

## 2021-04-30 ENCOUNTER — Encounter (HOSPITAL_COMMUNITY)
Admission: RE | Admit: 2021-04-30 | Discharge: 2021-04-30 | Disposition: A | Discharge status: Home / Self Care | Payer: No Typology Code available for payment source | Source: Ambulatory Visit | Attending: Cardiology | Admitting: Cardiology

## 2021-04-30 DIAGNOSIS — Z955 Presence of coronary angioplasty implant and graft: Secondary | ICD-10-CM

## 2021-04-30 DIAGNOSIS — Z48812 Encounter for surgical aftercare following surgery on the circulatory system: Secondary | ICD-10-CM | POA: Diagnosis not present

## 2021-04-30 DIAGNOSIS — F17200 Nicotine dependence, unspecified, uncomplicated: Secondary | ICD-10-CM

## 2021-04-30 NOTE — Progress Notes (Signed)
CARDIAC REHAB PHASE 2 ? ?Reviewed home exercise with pt today. Pt is tolerating exercise well. Pt will continue to exercise on their own by walking for 30-45 minutes per session 5-7 days a week in addition to the 3 days in CRP2. Advised pt on THRR, RPE scale, hydration and temperature/humidity precautions. Reinforced NTG use, S/S to stop exercise and when to call MD vs 911. Encouraged warm up cool down and stretches with exercise sessions. Pt verbalized understanding, all questions were answered and pt was given a copy to take home.  ?  ?Jacob Ali ACSM-CEP ?04/30/2021 ?12:13 PM ? ?

## 2021-05-01 ENCOUNTER — Other Ambulatory Visit: Payer: No Typology Code available for payment source

## 2021-05-01 DIAGNOSIS — I10 Essential (primary) hypertension: Secondary | ICD-10-CM

## 2021-05-01 LAB — BASIC METABOLIC PANEL
BUN/Creatinine Ratio: 16 (ref 10–24)
BUN: 17 mg/dL (ref 8–27)
CO2: 22 mmol/L (ref 20–29)
Calcium: 9.4 mg/dL (ref 8.6–10.2)
Chloride: 102 mmol/L (ref 96–106)
Creatinine, Ser: 1.09 mg/dL (ref 0.76–1.27)
Glucose: 162 mg/dL — ABNORMAL HIGH (ref 70–99)
Potassium: 4.5 mmol/L (ref 3.5–5.2)
Sodium: 136 mmol/L (ref 134–144)
eGFR: 77 mL/min/{1.73_m2} (ref >59)

## 2021-05-01 MED ORDER — NICOTINE 14 MG/24HR TD PT24: 14.0000 mg | MEDICATED_PATCH | Freq: Every day | TRANSDERMAL | 0 refills | Status: DC

## 2021-05-02 ENCOUNTER — Encounter (HOSPITAL_COMMUNITY)
Admission: RE | Admit: 2021-05-02 | Discharge: 2021-05-02 | Disposition: A | Discharge status: Home / Self Care | Payer: No Typology Code available for payment source | Source: Ambulatory Visit | Attending: Cardiology | Admitting: Cardiology

## 2021-05-02 DIAGNOSIS — Z48812 Encounter for surgical aftercare following surgery on the circulatory system: Secondary | ICD-10-CM | POA: Diagnosis not present

## 2021-05-02 DIAGNOSIS — Z955 Presence of coronary angioplasty implant and graft: Secondary | ICD-10-CM

## 2021-05-02 NOTE — Progress Notes (Signed)
Pt has been made aware of normal result and verbalized understanding.  jw

## 2021-05-04 ENCOUNTER — Telehealth (HOSPITAL_COMMUNITY): Payer: Self-pay | Admitting: *Deleted

## 2021-05-04 ENCOUNTER — Encounter (HOSPITAL_COMMUNITY)
Admission: RE | Admit: 2021-05-04 | Discharge: 2021-05-04 | Disposition: A | Discharge status: Home / Self Care | Payer: No Typology Code available for payment source | Source: Ambulatory Visit | Attending: Cardiology | Admitting: Cardiology

## 2021-05-04 ENCOUNTER — Telehealth: Payer: Self-pay | Admitting: *Deleted

## 2021-05-04 DIAGNOSIS — Z48812 Encounter for surgical aftercare following surgery on the circulatory system: Secondary | ICD-10-CM | POA: Diagnosis not present

## 2021-05-04 DIAGNOSIS — Z955 Presence of coronary angioplasty implant and graft: Secondary | ICD-10-CM

## 2021-05-04 MED ORDER — LISINOPRIL 20 MG PO TABS: 20.0000 mg | ORAL_TABLET | Freq: Every day | ORAL | 3 refills | Status: DC

## 2021-05-04 NOTE — Telephone Encounter (Addendum)
-----   Message from [name], MD sent at 05/04/2021  9:01 AM EDT ----- ?Regarding: RE: FYI - pt reported CP at home ?Thank you so much for letting me know. I think we should stop the lisinopril-HCTZ combination pill and just put him on lisinopril '20mg'$  daily which he should take at night. If needed, I can sneak some imdur '15mg'$  in the morning if he is having recurrent chest pain.  ? ?Keep me posted and we can always adjust further.  ?----- Message ----- ?From: Rowe Pavy, RN ?Sent: 05/04/2021   8:02 AM EDT ?To: [name], MD ?Subject: FYI - pt reported CP at home                  ? ?Good Morning! ? ?Jacob Ali came in this morning for cardiac rehab(he is doing well - I believe this has been a help to him).  Reported that he had an episode of CP he rated 8-9.  Pain began in his neck which spread to his shoulders. He stopped and sat down - did not improve so he took 1 ntg.  The discomfort slowly dissipated to a level 0.  Reported that he felt great and continued getting ready for exercise. He stated that this was a similar presentation prior to his stent placement. I did note on his cath report that he has some residual RCA disease treated medically. Exercised today with no issues, ekg remained unchanged. Jacob Ali denies any emotional distress or anxiety.  He has decreased his tobacco usage.   Vital signs remain WNL although he has had some bp readings in the low 90's.  He is asymptomatic but is concerned.  He takes  Coreg 6.25 twice a day and Zestoric 20/2.5 mg daily.  He takes these along with other am meds at 4:00 am.  Asked him to try taking the Zestoric mid day by itself - we will continue to monitor readings here to see if this helps.  I had him bring his bp cuff in - consistent with our readings here  mid 90's - 110's at home prior to taking his medication. ? ?Any further intervention warranted? ? ?Psychologist, clinical, BSN ?Cardiac and Pulmonary Rehab Nurse Navigator  ?  ? ? ? ?

## 2021-05-04 NOTE — Telephone Encounter (Signed)
-----   Message from [name], MD sent at 05/04/2021  9:01 AM EDT ----- ?Regarding: RE: FYI - pt reported CP at home ?Thank you so much for letting me know. I think we should stop the lisinopril-HCTZ combination pill and just put him on lisinopril '20mg'$  daily which he should take at night. If needed, I can sneak some imdur '15mg'$  in the morning if he is having recurrent chest pain.  ? ?Keep me posted and we can always adjust further.  ?----- Message ----- ?From: Rowe Pavy, RN ?Sent: 05/04/2021   8:02 AM EDT ?To: [name], MD ?Subject: FYI - pt reported CP at home                  ? ?Good Morning! ? ?Jacob Ali came in this morning for cardiac rehab( he is doing well - I believe this has been a help to him.  Reported that he had an episode of CP he rated 8-9.  Pain began in his neck which spread to his shoulders. He stopped and sat down - did not improve so he took 1 ntg.  The discomfort slowly dissipated to a level 0.  Reported that he felt great and continued getting ready for exercise. He stated that this was a similar presentation prior to his stent placement. I did note on his cath report that he has some residual RCA disease treated medically. Exercised today with no issues, ekg remained unchanged. Jacob Ali denies any emotional distress or anxiety.  He has decreased his tobacco usage.   Vital signs remain WNL although he has had some bp readings in the low 90's.  He is asymptomatic but is concerned.  He takes  Coreg 6.25 twice a day and Zestoric 20/2.5 mg daily.  He takes these along with other am meds at 4:00 am.  Asked him to try taking the Zestoric mid day by itself - we will continue to monitor readings here to see if this helps.  I had him bring his bp cuff in - consistent with our readings here  mid 90's - 110's at home prior to taking his medication. ? ?Any further intervention warranted? ? ?Psychologist, clinical, BSN ?Cardiac and Pulmonary Rehab Nurse Navigator  ?  ? ? ? ?

## 2021-05-04 NOTE — Telephone Encounter (Signed)
Called the pt to endorse med recommendations per Dr. Johney Frame.  ?Pt states he only wants to proceed with taking plain lisinopril 20 mg po daily, and prefers taking this midday vs at night.  ?He wants to hold off on imdur and touch base with [place] as needed, for he reports he only had one episode of mild chest pain this morning.  ?Pt aware to d/c his lisinopril-HCTZ and start taking lisinopril 20 mg po daily. ?Advised him to touch base with [place] as needed if we need to send the imdur 15 mg into his pharmacy.  ?Confirmed the pharmacy of choice with the pt. ?Pt verbalized understanding and agrees with this plan. ?Will send this information to cardiac rehab RN and Dr. Johney Frame as an Jul[facility] Rainier.  ?

## 2021-05-07 ENCOUNTER — Encounter (HOSPITAL_COMMUNITY)
Admission: RE | Admit: 2021-05-07 | Discharge: 2021-05-07 | Disposition: A | Discharge status: Home / Self Care | Payer: No Typology Code available for payment source | Source: Ambulatory Visit | Attending: Cardiology | Admitting: Cardiology

## 2021-05-07 DIAGNOSIS — Z955 Presence of coronary angioplasty implant and graft: Secondary | ICD-10-CM

## 2021-05-07 DIAGNOSIS — Z48812 Encounter for surgical aftercare following surgery on the circulatory system: Secondary | ICD-10-CM | POA: Diagnosis not present

## 2021-05-09 ENCOUNTER — Encounter (HOSPITAL_COMMUNITY)
Admission: RE | Admit: 2021-05-09 | Discharge: 2021-05-09 | Disposition: A | Discharge status: Home / Self Care | Payer: No Typology Code available for payment source | Source: Ambulatory Visit | Attending: Cardiology | Admitting: Cardiology

## 2021-05-09 DIAGNOSIS — Z955 Presence of coronary angioplasty implant and graft: Secondary | ICD-10-CM

## 2021-05-09 DIAGNOSIS — Z48812 Encounter for surgical aftercare following surgery on the circulatory system: Secondary | ICD-10-CM | POA: Diagnosis not present

## 2021-05-10 ENCOUNTER — Other Ambulatory Visit: Payer: Self-pay | Admitting: Primary Care

## 2021-05-10 ENCOUNTER — Other Ambulatory Visit: Payer: Self-pay | Admitting: Physician Assistant

## 2021-05-10 DIAGNOSIS — E1165 Type 2 diabetes mellitus with hyperglycemia: Secondary | ICD-10-CM

## 2021-05-11 ENCOUNTER — Encounter (HOSPITAL_COMMUNITY)
Admission: RE | Admit: 2021-05-11 | Discharge: 2021-05-11 | Disposition: A | Discharge status: Home / Self Care | Payer: No Typology Code available for payment source | Source: Ambulatory Visit | Attending: Cardiology | Admitting: Cardiology

## 2021-05-11 DIAGNOSIS — Z955 Presence of coronary angioplasty implant and graft: Secondary | ICD-10-CM

## 2021-05-11 DIAGNOSIS — Z48812 Encounter for surgical aftercare following surgery on the circulatory system: Secondary | ICD-10-CM | POA: Diagnosis not present

## 2021-05-14 ENCOUNTER — Encounter (HOSPITAL_COMMUNITY)
Admission: RE | Admit: 2021-05-14 | Discharge: 2021-05-14 | Disposition: A | Discharge status: Home / Self Care | Payer: No Typology Code available for payment source | Source: Ambulatory Visit | Attending: Cardiology | Admitting: Cardiology

## 2021-05-14 DIAGNOSIS — Z955 Presence of coronary angioplasty implant and graft: Secondary | ICD-10-CM | POA: Diagnosis not present

## 2021-05-14 DIAGNOSIS — Z48812 Encounter for surgical aftercare following surgery on the circulatory system: Secondary | ICD-10-CM | POA: Insufficient documentation

## 2021-05-15 ENCOUNTER — Other Ambulatory Visit: Payer: Self-pay | Admitting: Infectious Diseases

## 2021-05-16 ENCOUNTER — Encounter (HOSPITAL_COMMUNITY)
Admission: RE | Admit: 2021-05-16 | Discharge: 2021-05-16 | Disposition: A | Discharge status: Home / Self Care | Payer: No Typology Code available for payment source | Source: Ambulatory Visit | Attending: Cardiology | Admitting: Cardiology

## 2021-05-16 DIAGNOSIS — Z955 Presence of coronary angioplasty implant and graft: Secondary | ICD-10-CM

## 2021-05-16 DIAGNOSIS — Z48812 Encounter for surgical aftercare following surgery on the circulatory system: Secondary | ICD-10-CM | POA: Diagnosis not present

## 2021-05-16 NOTE — Telephone Encounter (Signed)
Please advise on refill.

## 2021-05-18 ENCOUNTER — Other Ambulatory Visit: Payer: Self-pay | Admitting: Primary Care

## 2021-05-18 ENCOUNTER — Other Ambulatory Visit (HOSPITAL_COMMUNITY): Payer: Self-pay

## 2021-05-18 ENCOUNTER — Encounter (HOSPITAL_COMMUNITY)
Admission: RE | Admit: 2021-05-18 | Discharge: 2021-05-18 | Disposition: A | Discharge status: Home / Self Care | Payer: No Typology Code available for payment source | Source: Ambulatory Visit | Attending: Cardiology | Admitting: Cardiology

## 2021-05-18 DIAGNOSIS — Z955 Presence of coronary angioplasty implant and graft: Secondary | ICD-10-CM

## 2021-05-18 DIAGNOSIS — Z48812 Encounter for surgical aftercare following surgery on the circulatory system: Secondary | ICD-10-CM | POA: Diagnosis not present

## 2021-05-18 DIAGNOSIS — E1165 Type 2 diabetes mellitus with hyperglycemia: Secondary | ICD-10-CM

## 2021-05-18 MED ORDER — METFORMIN HCL ER 500 MG PO TB24: 500.0000 mg | ORAL_TABLET | Freq: Every day | ORAL | 1 refills | Status: DC

## 2021-05-21 ENCOUNTER — Encounter (HOSPITAL_COMMUNITY)
Admission: RE | Admit: 2021-05-21 | Discharge: 2021-05-21 | Disposition: A | Discharge status: Home / Self Care | Payer: No Typology Code available for payment source | Source: Ambulatory Visit | Attending: Cardiology | Admitting: Cardiology

## 2021-05-21 DIAGNOSIS — Z48812 Encounter for surgical aftercare following surgery on the circulatory system: Secondary | ICD-10-CM | POA: Diagnosis not present

## 2021-05-21 DIAGNOSIS — Z955 Presence of coronary angioplasty implant and graft: Secondary | ICD-10-CM

## 2021-05-22 ENCOUNTER — Other Ambulatory Visit (HOSPITAL_COMMUNITY): Payer: Self-pay

## 2021-05-22 NOTE — Progress Notes (Signed)
Cardiac Individual Treatment Plan ? ?Patient Details  ?Name: Jacob Ali ?MRN: [id number] ?Date of Birth: Sep 16, 1960 ?Referring Provider:   ?Flowsheet Row CARDIAC REHAB PHASE II ORIENTATION from 04/12/2021 in [facility]  ?Referring Provider Dr. Gwyndolyn Kaufman, MD  ? ?  ? ? ?Initial Encounter Date:  ?Flowsheet Row CARDIAC REHAB PHASE II ORIENTATION from 04/12/2021 in [facility]  ?Date 04/12/21  ? ?  ? ? ?Visit Diagnosis: 01/30/21 S/P DES LAD ? ?Patient's Home Medications on Admission: ? ?Current Outpatient Medications:  ?  ACCU-CHEK GUIDE test strip, USE UP TO 4 TIMES A DAY AS DIRECTED, Disp: 100 strip, Rfl: 5 ?  Accu-Chek Softclix Lancets lancets, USE UP TO 4 TIMES A DAY AS DIRECTED, Disp: 100 each, Rfl: 5 ?  acetaminophen (TYLENOL) 500 MG tablet, Take 1,000 mg by mouth every 6 (six) hours as needed for moderate pain., Disp: , Rfl:  ?  aspirin 81 MG chewable tablet, Chew 1 tablet (81 mg total) by mouth daily., Disp: , Rfl:  ?  bictegravir-emtricitabine-tenofovir AF (BIKTARVY) 50-200-25 MG TABS tablet, TAKE 1 TABLET BY MOUTH DAILY., Disp: 30 tablet, Rfl: 6 ?  carvedilol (COREG) 6.25 MG tablet, TAKE 1 TABLET BY MOUTH 2 TIMES DAILY WITH A MEAL., Disp: 180 tablet, Rfl: 3 ?  clopidogrel (PLAVIX) 75 MG tablet, TAKE 1 TABLET BY MOUTH EVERY DAY, Disp: 90 tablet, Rfl: 2 ?  dapagliflozin propanediol (FARXIGA) 10 MG TABS tablet, Take 1 tablet (10 mg total) by mouth daily before breakfast., Disp: 30 tablet, Rfl: 6 ?  diphenhydramine-acetaminophen (TYLENOL PM) 25-500 MG TABS tablet, Take 1 tablet by mouth at bedtime as needed (sleep)., Disp: , Rfl:  ?  fluticasone (FLONASE) 50 MCG/ACT nasal spray, Place 1 spray into both nostrils 2 (two) times daily., Disp: 48 g, Rfl: 1 ?  lisinopril (ZESTRIL) 20 MG tablet, Take 1 tablet (20 mg total) by mouth daily., Disp: 90 tablet, Rfl: 3 ?  metFORMIN (GLUCOPHAGE-XR) 500 MG 24 hr tablet, Take 1 tablet (500 mg total) by mouth  daily with breakfast. for diabetes., Disp: 90 tablet, Rfl: 1 ?  nicotine (NICODERM CQ - DOSED IN MG/24 HOURS) 14 mg/24hr patch, Place 1 patch (14 mg total) onto the skin daily., Disp: 28 patch, Rfl: 0 ?  nitroGLYCERIN (NITROSTAT) 0.4 MG SL tablet, Place 1 tablet (0.4 mg total) under the tongue every 5 (five) minutes x 3 doses as needed for chest pain., Disp: 25 tablet, Rfl: 4 ?  pantoprazole (PROTONIX) 40 MG tablet, Take 1 tablet (40 mg total) by mouth daily. For heartburn., Disp: 90 tablet, Rfl: 3 ?  rosuvastatin (CRESTOR) 40 MG tablet, Take 1 tablet (40 mg total) by mouth daily., Disp: 30 tablet, Rfl: 6 ?  temazepam (RESTORIL) 7.5 MG capsule, TAKE 1 CAPSULE BY MOUTH EVERY DAY AT BEDTIME AS NEEDED SLEEP, Disp: 30 capsule, Rfl: 0 ? ?Past Medical History: ?Past Medical History:  ?Diagnosis Date  ? CAD in native artery 01/31/2021  ? Depression   ? GERD (gastroesophageal reflux disease)   ? HIV infection ([place])   ? Hyperlipidemia   ? Hypertension   ? Peripheral vascular disease ([place])   ? PONV (postoperative nausea and vomiting)   ? S/P angioplasty with stent 01/30/21 to LAD  01/31/2021  ? Sleep apnea   ? Subdural hemorrhage ([place])   ? Tubular adenoma of colon 11/03/2017  ? Colonoscopy Sept 9, 2019  ? ? ?Tobacco Use: ?Social History  ? ?Tobacco Use  ?Smoking Status  Every Day  ? Packs/day: 0.50  ? Years: 40.00  ? Pack years: 20.00  ? Types: Cigars, Cigarettes  ?Smokeless Tobacco Never  ?Tobacco Comments  ? 1/2PPD Jacob Ali was wearing a nicotine patch today and says he plans to quit smoking. Given the phone number to 1-800=quit-now   ? ? ?Labs: ?Review Flowsheet   ? ?  ?  Latest Ref Rng & Units 09/13/2020 11/14/2020 01/24/2021 04/05/2021  ?Labs for ITP Cardiac and Pulmonary Rehab  ?Cholestrol 100 - 199 mg/dL 207   162   169     ?LDL (calc) 0 - 99 mg/dL      ?Direct LDL mg/dL 124.0    98.0     ?HDL-C >39 mg/dL 30.60   25   29.00     ?Trlycerides 0 - 149 mg/dL 379.0   571   288.0     ?Hemoglobin A1c 4.0 - 5.6 %   5.7   6.6    ? ?   04/24/2021  ?Labs for ITP Cardiac and Pulmonary Rehab  ?Cholestrol 115    ?LDL (calc) 57    ?Direct LDL   ?HDL-C 25    ?Trlycerides 200    ?Hemoglobin A1c   ?  ? ? Multiple values from one day are sorted in reverse-chronological order  ?  ?  ? ? ?Capillary Blood Glucose: ?Lab Results  ?Component Value Date  ? GLUCAP 206 (H) 04/20/2021  ? GLUCAP 269 (H) 04/18/2021  ? GLUCAP 287 (H) 04/18/2021  ? GLUCAP 240 (H) 04/16/2021  ? GLUCAP 290 (H) 04/16/2021  ? ? ? ?Exercise Target Goals: ?Exercise Program Goal: ?Individual exercise prescription set using results from initial 6 min walk test and THRR while considering  patient?s activity barriers and safety.  ? ?Exercise Prescription Goal: ?Initial exercise prescription builds to 30-45 minutes a day of aerobic activity, 2-3 days per week.  Home exercise guidelines will be given to patient during program as part of exercise prescription that the participant will acknowledge. ? ?Activity Barriers & Risk Stratification: ? Activity Barriers & Cardiac Risk Stratification - 04/12/21 1009   ? ?  ? Activity Barriers & Cardiac Risk Stratification  ? Activity Barriers Deconditioning   ? Cardiac Risk Stratification High   ? ?  ?  ? ?  ? ? ?6 Minute Walk: ? 6 Minute Walk   ? ? [place] Name 04/12/21 1001  ?  ?  ?  ? 6 Minute Walk  ? Phase Initial    ? Distance 1200 feet    ? Walk Time 6 minutes    ? # of Rest Breaks 0    ? MPH 2.27    ? METS 2.94    ? RPE 10    ? Perceived Dyspnea  0    ? VO2 Peak 10.27    ? Symptoms Yes (comment)    ? Comments 3.20 mins in, c/o tightness in leg from claudication. 7/10 pain last lap bilate hip and left leg    ? Resting HR 82 bpm    ? Resting BP 118/74    ? Resting Oxygen Saturation  95 %    ? Exercise Oxygen Saturation  during 6 min walk 96 %    ? Max Ex. HR 90 bpm    ? Max Ex. BP 112/62    ? 2 Minute Post BP 114/60    ? ?  ?  ? ?  ? ? ?Oxygen Initial Assessment: ? ? ?Oxygen Re-Evaluation: ? ? ?  Oxygen Discharge (Final Oxygen Re-Evaluation): ? ? ?Initial  Exercise Prescription: ? Initial Exercise Prescription - 04/12/21 1000   ? ?  ? Date of Initial Exercise RX and Referring Provider  ? Date 04/12/21   ? Referring Provider Dr. Gwyndolyn Kaufman, MD   ? Expected Discharge Date 06/08/21   ?  ? NuStep  ? Level 2   ? SPM 80   ? Minutes 15   ? METs 2   ?  ? Track  ? Laps 10   ? Minutes 15   ? METs 2.16   ?  ? Prescription Details  ? Frequency (times per week) 3   ? Duration Progress to 30 minutes of continuous aerobic without signs/symptoms of physical distress   ?  ? Intensity  ? THRR 40-80% of Max Heartrate 63-126   ? Ratings of Perceived Exertion 11-13   ? Perceived Dyspnea 0-4   ?  ? Progression  ? Progression Continue progressive overload as per policy without signs/symptoms or physical distress.   ?  ? Resistance Training  ? Training Prescription Yes   ? Weight 3   ? Reps 10-15   ? ?  ?  ? ?  ? ? ?Perform Capillary Blood Glucose checks as needed. ? ?Exercise Prescription Changes: ? ? Exercise Prescription Changes   ? ? [place] Name 04/16/21 0830 04/30/21 0830 05/04/21 1630  ?  ?  ?  ? Response to Exercise  ? Blood Pressure (Admit) 112/60 100/60 98/50    ? Blood Pressure (Exercise) 124/60 152/64 122/62    ? Blood Pressure (Exit) 108/68 102/56 104/60    ? Heart Rate (Admit) 85 bpm 90 bpm 72 bpm    ? Heart Rate (Exercise) 94 bpm 103 bpm 99 bpm    ? Heart Rate (Exit) 88 bpm 75 bpm 72 bpm    ? Rating of Perceived Exertion (Exercise) 11 12.5 12    ? Perceived Dyspnea (Exercise) 0 0 0    ? Symptoms Claudication pain 7/10 relieved with rest Claudication pain 10/10 relieved with rest Claudication pain 7/10 relieved with rest. Pt got bettter shoes    ? Comments Pt first day in the CRP2 program Reviewed MET's, goals and home ExRx Reviewed MET's,    ? Duration Progress to 30 minutes of  aerobic without signs/symptoms of physical distress Progress to 30 minutes of  aerobic without signs/symptoms of physical distress Progress to 30 minutes of  aerobic without signs/symptoms of  physical distress    ? Intensity THRR unchanged THRR unchanged THRR unchanged    ?  ? Progression  ? Progression Continue to progress workloads to maintain intensity without signs/symptoms of physical distress. Con

## 2021-05-23 ENCOUNTER — Encounter (HOSPITAL_COMMUNITY)
Admission: RE | Admit: 2021-05-23 | Discharge: 2021-05-23 | Disposition: A | Discharge status: Home / Self Care | Payer: No Typology Code available for payment source | Source: Ambulatory Visit | Attending: Cardiology | Admitting: Cardiology

## 2021-05-23 ENCOUNTER — Ambulatory Visit: Payer: No Typology Code available for payment source | Admitting: Skilled Nursing Facility1

## 2021-05-23 DIAGNOSIS — Z955 Presence of coronary angioplasty implant and graft: Secondary | ICD-10-CM

## 2021-05-23 DIAGNOSIS — Z48812 Encounter for surgical aftercare following surgery on the circulatory system: Secondary | ICD-10-CM | POA: Diagnosis not present

## 2021-05-25 ENCOUNTER — Encounter (HOSPITAL_COMMUNITY)
Admission: RE | Admit: 2021-05-25 | Discharge: 2021-05-25 | Disposition: A | Discharge status: Home / Self Care | Payer: No Typology Code available for payment source | Source: Ambulatory Visit | Attending: Cardiology | Admitting: Cardiology

## 2021-05-25 DIAGNOSIS — Z48812 Encounter for surgical aftercare following surgery on the circulatory system: Secondary | ICD-10-CM | POA: Diagnosis not present

## 2021-05-25 DIAGNOSIS — Z955 Presence of coronary angioplasty implant and graft: Secondary | ICD-10-CM

## 2021-05-28 ENCOUNTER — Encounter (HOSPITAL_COMMUNITY)
Admission: RE | Admit: 2021-05-28 | Discharge: 2021-05-28 | Disposition: A | Discharge status: Home / Self Care | Payer: No Typology Code available for payment source | Source: Ambulatory Visit | Attending: Cardiology | Admitting: Cardiology

## 2021-05-28 DIAGNOSIS — Z48812 Encounter for surgical aftercare following surgery on the circulatory system: Secondary | ICD-10-CM | POA: Diagnosis not present

## 2021-05-28 DIAGNOSIS — Z955 Presence of coronary angioplasty implant and graft: Secondary | ICD-10-CM

## 2021-05-30 ENCOUNTER — Encounter (HOSPITAL_COMMUNITY)
Admission: RE | Admit: 2021-05-30 | Discharge: 2021-05-30 | Disposition: A | Discharge status: Home / Self Care | Payer: No Typology Code available for payment source | Source: Ambulatory Visit | Attending: Cardiology | Admitting: Cardiology

## 2021-05-30 ENCOUNTER — Other Ambulatory Visit: Payer: Self-pay | Admitting: Primary Care

## 2021-05-30 DIAGNOSIS — F17200 Nicotine dependence, unspecified, uncomplicated: Secondary | ICD-10-CM

## 2021-05-30 DIAGNOSIS — Z48812 Encounter for surgical aftercare following surgery on the circulatory system: Secondary | ICD-10-CM | POA: Diagnosis not present

## 2021-05-30 DIAGNOSIS — Z955 Presence of coronary angioplasty implant and graft: Secondary | ICD-10-CM

## 2021-06-01 ENCOUNTER — Encounter (HOSPITAL_COMMUNITY)
Admission: RE | Admit: 2021-06-01 | Discharge: 2021-06-01 | Disposition: A | Discharge status: Home / Self Care | Payer: No Typology Code available for payment source | Source: Ambulatory Visit | Attending: Cardiology | Admitting: Cardiology

## 2021-06-01 DIAGNOSIS — Z955 Presence of coronary angioplasty implant and graft: Secondary | ICD-10-CM

## 2021-06-01 DIAGNOSIS — Z48812 Encounter for surgical aftercare following surgery on the circulatory system: Secondary | ICD-10-CM | POA: Diagnosis not present

## 2021-06-04 ENCOUNTER — Encounter (HOSPITAL_COMMUNITY)
Admission: RE | Admit: 2021-06-04 | Discharge: 2021-06-04 | Disposition: A | Discharge status: Home / Self Care | Payer: No Typology Code available for payment source | Source: Ambulatory Visit | Attending: Cardiology | Admitting: Cardiology

## 2021-06-04 DIAGNOSIS — Z955 Presence of coronary angioplasty implant and graft: Secondary | ICD-10-CM

## 2021-06-04 DIAGNOSIS — Z48812 Encounter for surgical aftercare following surgery on the circulatory system: Secondary | ICD-10-CM | POA: Diagnosis not present

## 2021-06-04 NOTE — Progress Notes (Unsigned)
Cardiology Office Note   Date:  06/06/2021   ID:  Jacob Ali, DOB Jan 05, 1961, MRN [id number]  PCP:  Pleas Koch, NP  Cardiologist: Dr. Johney Frame  No chief complaint on file.     History of Present Illness: Jacob Ali is a 61 y.o. male who was referred by Dr. Johney Frame for evaluation and management of peripheral arterial disease.  He has known history of coronary artery disease status post PCI to the mid LAD in 2003, GERD, HIV, hyperlipidemia, hypertension, obstructive sleep apnea, tobacco use, colon cancer and peripheral arterial disease. He was hospitalized in January of this year with non-ST elevation myocardial infarction.  Cardiac catheterization showed severe mid LAD stenosis that was treated with PCI and drug-eluting stent placement.  He was referred to cardiac rehab for he reported mild claudication. He used to be followed by Dr. Delana Meyer.  He had claudication and underwent angioplasty and stent placement to the left SFA and popliteal arteries as well as stent placement to the right common iliac artery in 2018.  Most recent Doppler studies in March of this year showed an ABI of 0.88 on the right and 0.72 on the left.  There was evidence of significant in-stent restenosis in the left mid SFA stent with peak velocity of 382. The patient reports that he had left calf claudication at the beginning of cardiac rehab that improved significantly with exercise.  At the present time, when he does his regular activities, he does not experience any claudication.  He has been doing well with no chest pain or shortness of breath.  He is trying to quit smoking and currently using a nicotine patch. He works at [employer].  Past Medical History:  Diagnosis Date   CAD in native artery 01/31/2021   Depression    GERD (gastroesophageal reflux disease)    HIV infection (HCC)    Hyperlipidemia    Hypertension    Peripheral vascular disease (HCC)    PONV (postoperative nausea  and vomiting)    S/P angioplasty with stent 01/30/21 to LAD  01/31/2021   Sleep apnea    Subdural hemorrhage ([place])    Tubular adenoma of colon 11/03/2017   Colonoscopy Sept 9, 2019    Past Surgical History:  Procedure Laterality Date   CARDIAC CATHETERIZATION     CORONARY STENT INTERVENTION N/A 01/30/2021   Procedure: CORONARY STENT INTERVENTION;  Surgeon: Belva Crome, MD;  Location: [place] [facility];  Service: Cardiovascular;  Laterality: N/A;   Examination under anesthesia, repair of anal fissure,  12/04/2000   LEFT HEART CATH AND CORONARY ANGIOGRAPHY N/A 01/30/2021   Procedure: LEFT HEART CATH AND CORONARY ANGIOGRAPHY;  Surgeon: Belva Crome, MD;  Location: [place] [facility];  Service: Cardiovascular;  Laterality: N/A;   PERIPHERAL VASCULAR CATHETERIZATION Left 02/06/2016   Procedure: Lower Extremity Angiography;  Surgeon: Katha Cabal, MD;  Location: [place] [facility];  Service: Cardiovascular;  Laterality: Left;   PERIPHERAL VASCULAR CATHETERIZATION N/A 02/06/2016   Procedure: Abdominal Aortogram w/Lower Extremity;  Surgeon: Katha Cabal, MD;  Location: [place] [facility];  Service: Cardiovascular;  Laterality: N/A;   PERIPHERAL VASCULAR CATHETERIZATION  02/06/2016   Procedure: Lower Extremity Intervention;  Surgeon: Katha Cabal, MD;  Location: [place] [facility];  Service: Cardiovascular;;     Current Outpatient Medications  Medication Sig Dispense Refill   ACCU-CHEK GUIDE test strip USE UP TO 4 TIMES A DAY AS DIRECTED 100 strip 5  Accu-Chek Softclix Lancets lancets USE UP TO 4 TIMES A DAY AS DIRECTED 100 each 5   acetaminophen (TYLENOL) 500 MG tablet Take 1,000 mg by mouth every 6 (six) hours as needed for moderate pain.     aspirin 81 MG chewable tablet Chew 1 tablet (81 mg total) by mouth daily.     bictegravir-emtricitabine-tenofovir AF (BIKTARVY) 50-200-25 MG TABS tablet TAKE 1 TABLET BY MOUTH DAILY. 30 tablet 6   carvedilol (COREG) 6.25 MG  tablet TAKE 1 TABLET BY MOUTH 2 TIMES DAILY WITH A MEAL. 180 tablet 3   clopidogrel (PLAVIX) 75 MG tablet TAKE 1 TABLET BY MOUTH EVERY DAY 90 tablet 2   dapagliflozin propanediol (FARXIGA) 10 MG TABS tablet Take 1 tablet (10 mg total) by mouth daily before breakfast. 30 tablet 6   diphenhydramine-acetaminophen (TYLENOL PM) 25-500 MG TABS tablet Take 1 tablet by mouth at bedtime as needed (sleep).     fluticasone (FLONASE) 50 MCG/ACT nasal spray Place 1 spray into both nostrils 2 (two) times daily. 48 g 1   lisinopril (ZESTRIL) 20 MG tablet Take 1 tablet (20 mg total) by mouth daily. 90 tablet 3   metFORMIN (GLUCOPHAGE-XR) 500 MG 24 hr tablet Take 1 tablet (500 mg total) by mouth daily with breakfast. for diabetes. 90 tablet 1   nicotine (NICODERM CQ - DOSED IN MG/24 HOURS) 21 mg/24hr patch Place 1 patch (21 mg total) onto the skin daily. 28 patch 0   nitroGLYCERIN (NITROSTAT) 0.4 MG SL tablet Place 1 tablet (0.4 mg total) under the tongue every 5 (five) minutes x 3 doses as needed for chest pain. 25 tablet 4   pantoprazole (PROTONIX) 40 MG tablet Take 1 tablet (40 mg total) by mouth daily. For heartburn. 90 tablet 3   rosuvastatin (CRESTOR) 40 MG tablet Take 1 tablet (40 mg total) by mouth daily. 30 tablet 6   temazepam (RESTORIL) 7.5 MG capsule TAKE 1 CAPSULE BY MOUTH EVERY DAY AT BEDTIME AS NEEDED SLEEP 30 capsule 0   No current facility-administered medications for this visit.    Allergies:   Atorvastatin and Tamiflu [oseltamivir phosphate]    Social History:  The patient  reports that he has been smoking cigars and cigarettes. He has a 20.00 pack-year smoking history. He has never used smokeless tobacco. He reports current alcohol use of about 2.0 standard drinks per week. He reports that he does not currently use drugs after having used the following drugs: Marijuana.   Family History:  The patient's family history includes Asthma in his mother; Cancer in his father and maternal  grandmother; Heart disease in his father; Hypertension in his mother; Throat cancer in his father.    ROS:  Please see the history of present illness.   Otherwise, review of systems are positive for none.   All other systems are reviewed and negative.    PHYSICAL EXAM: VS:  BP 140/72   Pulse 71   Ht '5\' 9"'$  (1.753 m)   Wt 185 lb (83.9 kg)   SpO2 96%   BMI 27.32 kg/m  , BMI Body mass index is 27.32 kg/m. GEN: Well nourished, well developed, in no acute distress  HEENT: normal  Neck: no JVD, carotid bruits, or masses Cardiac: RRR; no murmurs, rubs, or gallops,no edema  Respiratory:  clear to auscultation bilaterally, normal work of breathing GI: soft, nontender, nondistended, + BS MS: no deformity or atrophy  Skin: warm and dry, no rash Neuro:  Strength and sensation are intact Psych: euthymic  mood, full affect Vascular: Femoral pulses normal bilaterally.  Distal pulses are normal on the right and barely palpable on the left.   EKG:  EKG is not ordered today.   Recent Labs: 06/26/2020: TSH 1.570 01/31/2021: Hemoglobin 13.3; Platelets 242 04/24/2021: ALT 11 05/01/2021: BUN 17; Creatinine, Ser 1.09; Potassium 4.5; Sodium 136    Lipid Panel    Component Value Date/Time   CHOL 115 04/24/2021 0727   TRIG 200 (H) 04/24/2021 0727   HDL 25 (L) 04/24/2021 0727   CHOLHDL 4.6 04/24/2021 0727   CHOLHDL 6 01/24/2021 0954   VLDL 57.6 (H) 01/24/2021 0954   LDLCALC 57 04/24/2021 0727   LDLCALC  11/14/2020 1522     Comment:     . LDL cholesterol not calculated. Triglyceride levels greater than 400 mg/dL invalidate calculated LDL results. . Reference range: <100 . Desirable range <100 mg/dL for primary prevention;   <70 mg/dL for patients with CHD or diabetic patients  with > or = 2 CHD risk factors. Marland Kitchen LDL-C is now calculated using the Martin-Hopkins  calculation, which is a validated novel method providing  better accuracy than the Friedewald equation in the  estimation of  LDL-C.  Cresenciano Genre et al. Annamaria Helling. 9191;660(60): 2061-2068  (http://education.[employer].com/faq/FAQ164)    LDLDIRECT 98.0 01/24/2021 0954      Wt Readings from Last 3 Encounters:  06/05/21 185 lb (83.9 kg)  04/20/21 183 lb 12.8 oz (83.4 kg)  04/12/21 184 lb 15.5 oz (83.9 kg)           View : No data to display.            ASSESSMENT AND PLAN:  1.  Peripheral arterial disease: The patient has moderate left calf claudication Rutherford class II.  Symptoms improved significantly with an exercise program at cardiac rehab.  There is evidence of significant in-stent restenosis in the distal SFA/popliteal arteries.  The studies were reviewed by me today.  This is not a limb threatening situation and this was explained to him.  He has no evidence of critical limb ischemia.  I discussed with him the natural history and management of claudication.  Given improvement in symptoms with exercise, I recommend continuing medical therapy and continued a walking exercise program.  2.  Coronary artery disease involving native coronary arteries: He is doing well with no anginal symptoms.  He is currently on dual antiplatelet therapy.  3.  Tobacco use: He is using a nicotine patch and is trying to quit.  4.  Essential hypertension: Blood pressure is reasonably controlled on current medications.  5.  Hyperlipidemia: Continue treatment with rosuvastatin.  I reviewed most recent lipid profile which showed an LDL of 57 which is at target.    Disposition:   FU with me in 6 months  Signed,  Kathlyn Sacramento, MD  06/06/2021 9:03 AM    [place]

## 2021-06-05 ENCOUNTER — Encounter: Payer: Self-pay | Admitting: Cardiovascular Disease

## 2021-06-05 ENCOUNTER — Ambulatory Visit (INDEPENDENT_AMBULATORY_CARE_PROVIDER_SITE_OTHER): Payer: No Typology Code available for payment source | Admitting: Cardiovascular Disease

## 2021-06-05 VITALS — BP 140/72 | HR 71 | Ht 69.0 in | Wt 185.0 lb

## 2021-06-05 DIAGNOSIS — I2510 Atherosclerotic heart disease of native coronary artery without angina pectoris: Secondary | ICD-10-CM

## 2021-06-05 DIAGNOSIS — I739 Peripheral vascular disease, unspecified: Secondary | ICD-10-CM

## 2021-06-05 DIAGNOSIS — E782 Mixed hyperlipidemia: Secondary | ICD-10-CM

## 2021-06-05 DIAGNOSIS — I10 Essential (primary) hypertension: Secondary | ICD-10-CM | POA: Diagnosis not present

## 2021-06-05 DIAGNOSIS — Z720 Tobacco use: Secondary | ICD-10-CM | POA: Diagnosis not present

## 2021-06-05 NOTE — Patient Instructions (Signed)
Medication Instructions:  No changes *If you need a refill on your cardiac medications before your next appointment, please call your pharmacy*   Lab Work: None ordered If you have labs (blood work) drawn today and your tests are completely normal, you will receive your results only by: Jacob Ali (if you have MyChart) OR A paper copy in the mail If you have any lab test that is abnormal or we need to change your treatment, we will call you to review the results.   Testing/Procedures: None ordered   Follow-Up: At [facility], you and your health needs are our priority.  As part of our continuing mission to provide you with exceptional heart care, we have created designated Provider Care Teams.  These Care Teams include your primary Cardiologist (physician) and Advanced Practice Providers (APPs -  Physician Assistants and Nurse Practitioners) who all work together to provide you with the care you need, when you need it.  We recommend signing up for the patient portal called "MyChart".  Sign up information is provided on this After Visit Summary.  MyChart is used to connect with patients for Virtual Visits (Telemedicine).  Patients are able to view lab/test results, encounter notes, upcoming appointments, etc.  Non-urgent messages can be sent to your provider as well.   To learn more about what you can do with MyChart, go to [email or web address].    Your next appointment:   6 month(s)  The format for your next appointment:   In Person  Provider:   Dr. Fletcher Anon   Other Instructions [name] (PAD)   General Information:   Research in vascular exercise has demonstrated remarkable improvement in symptoms of leg pain (claudication) without expensive or invasive interventions. Regular walking programs are extremely helpful for patients with PAD and intermittent claudication.  These steps are designed to help you get  started with a safe and effective program to help you walk farther with less pain:   Walk at least three times a week (preferably every day).  Your goal is to build up to 30-45 minutes of total walking time (not counting rest breaks). It may take you several weeks to build up your exercise time starting at 5-10 minutes or whatever you can tolerate.  Walk as far as possible using moderate to maximal pain (7-8 on the scale below) as a signal to stop, and resume walking when the pain goes away.  On a treadmill, set the speed and grade at a level that brings on the claudication pain within 3 to 5 minutes. Walk at this rate until you experience claudication of moderate severity, rest until the pain improves, and then resume walking.  Over time, you will be able to walk longer at the designated speed and grade; workload should then be increased until you develop the pain within 3 to 5 minutes once again.  This regimen will induce a significant benefit. Studies have demonstrated that participants may be able to walk up to three or four times farther and have less leg pain, within twelve weeks, by following this protocol.  Pain Scale    0_____1_____2_____3_____4_____5_____6_____7_____8_____9_____10   No Pain                                   Moderate Pain  Maximal Pain

## 2021-06-06 ENCOUNTER — Encounter (HOSPITAL_COMMUNITY)
Admission: RE | Admit: 2021-06-06 | Discharge: 2021-06-06 | Disposition: A | Discharge status: Home / Self Care | Payer: No Typology Code available for payment source | Source: Ambulatory Visit | Attending: Cardiology | Admitting: Cardiology

## 2021-06-06 DIAGNOSIS — Z955 Presence of coronary angioplasty implant and graft: Secondary | ICD-10-CM

## 2021-06-06 DIAGNOSIS — Z48812 Encounter for surgical aftercare following surgery on the circulatory system: Secondary | ICD-10-CM | POA: Diagnosis not present

## 2021-06-08 ENCOUNTER — Encounter (HOSPITAL_COMMUNITY)
Admission: RE | Admit: 2021-06-08 | Discharge: 2021-06-08 | Disposition: A | Discharge status: Home / Self Care | Payer: No Typology Code available for payment source | Source: Ambulatory Visit | Attending: Cardiology | Admitting: Cardiology

## 2021-06-08 DIAGNOSIS — Z48812 Encounter for surgical aftercare following surgery on the circulatory system: Secondary | ICD-10-CM | POA: Diagnosis not present

## 2021-06-08 DIAGNOSIS — Z955 Presence of coronary angioplasty implant and graft: Secondary | ICD-10-CM

## 2021-06-08 NOTE — Progress Notes (Signed)
Discharge Progress Report  Patient Details  Name: KYLIL SWOPES MRN: [id number] Date of Birth: [birth year] Referring Provider:   Flowsheet Row CARDIAC REHAB PHASE II ORIENTATION from 04/12/2021 in [facility]  Referring Provider Dr. Gwyndolyn Kaufman, MD        Number of Visits: 24/24  Reason for Discharge:  Patient reached a stable level of exercise. Patient independent in their exercise. Patient has met program and personal goals.  Smoking History:  Social History   Tobacco Use  Smoking Status Every Day   Packs/day: 0.50   Years: 40.00   Total pack years: 20.00   Types: Cigars, Cigarettes  Smokeless Tobacco Never  Tobacco Comments   1/2PPD Mr Class was wearing a nicotine patch today and says he plans to quit smoking. Given the phone number to 1-800=quit-now     Diagnosis:  01/30/21 S/P DES LAD  ADL [facility]:   Initial Exercise Prescription:  Initial Exercise Prescription - 04/12/21 1000       Date of Initial Exercise RX and Referring Provider   Date 04/12/21    Referring Provider Dr. Gwyndolyn Kaufman, MD    Expected Discharge Date 06/08/21      NuStep   Level 2    SPM 80    Minutes 15    METs 2      Track   Laps 10    Minutes 15    METs 2.16      Prescription Details   Frequency (times per week) 3    Duration Progress to 30 minutes of continuous aerobic without signs/symptoms of physical distress      Intensity   THRR 40-80% of Max Heartrate 63-126    Ratings of Perceived Exertion 11-13    Perceived Dyspnea 0-4      Progression   Progression Continue progressive overload as per policy without signs/symptoms or physical distress.      Resistance Training   Training Prescription Yes    Weight 3    Reps 10-15             Discharge Exercise Prescription (Final Exercise Prescription Changes):  Exercise Prescription Changes - 06/08/21 0830       Response to Exercise   Blood Pressure (Admit) 108/60    Blood  Pressure (Exercise) 128/60    Blood Pressure (Exit) 122/62   138/72   Heart Rate (Admit) 87 bpm    Heart Rate (Exercise) 95 bpm    Heart Rate (Exit) 92 bpm    Rating of Perceived Exertion (Exercise) 8.5    Perceived Dyspnea (Exercise) 0    Symptoms Claudication pain 7/10 relieved with rest. Pt got bettter shoes    Comments Pt graduated the CRP2 program    Duration Progress to 30 minutes of  aerobic without signs/symptoms of physical distress    Intensity THRR unchanged      Progression   Progression Continue to progress workloads to maintain intensity without signs/symptoms of physical distress.    Average METs 4.2      Resistance Training   Training Prescription Yes    Weight 5    Reps 10-15    Time 10 Minutes      NuStep   Level 5    SPM 100    Minutes 20    METs 4.2      Track   Laps 8   6 MWT   Minutes 6      Home Exercise Plan   Plans  to continue exercise at Home (comment)    Frequency Add 4 additional days to program exercise sessions.    Initial Home Exercises Provided 04/30/21             Functional Capacity:  6 Minute Walk     Row Name 04/12/21 1001 06/08/21 1151       6 Minute Walk   Phase Initial Discharge    Distance 1200 feet 1665 feet    Distance % Change -- 38.75 %    Distance Feet Change -- 465 ft    Walk Time 6 minutes 6 minutes    # of Rest Breaks 0 0    MPH 2.27 3.15    METS 2.94 3.89    RPE 10 10    Perceived Dyspnea  0 0    VO2 Peak 10.27 13.63    Symptoms Yes (comment) Yes (comment)    Comments 3.20 mins in, c/o tightness in leg from claudication. 7/10 pain last lap bilate hip and left leg 7/10 claudication pain bilateral leg/feet    Resting HR 82 bpm 87 bpm    Resting BP 118/74 108/60    Resting Oxygen Saturation  95 % 95 %    Exercise Oxygen Saturation  during 6 min walk 96 % 97 %    Max Ex. HR 90 bpm 95 bpm    Max Ex. BP 112/62 128/60    2 Minute Post BP 114/60 122/62             Psychological, QOL, Others -  Outcomes: PHQ 2/9:    06/08/2021    1:42 PM 04/12/2021   12:57 PM 04/05/2021    2:43 PM 11/23/2019    3:42 PM 11/18/2017    3:35 PM  Depression screen PHQ 2/9  Decreased Interest 0 0 0 0 0  Down, Depressed, Hopeless 0 0 0 1 0  PHQ - 2 Score 0 0 0 1 0  Altered sleeping 1  1    Tired, decreased energy 2  3    Change in appetite 0  0    Feeling bad or failure about yourself  0  0    Trouble concentrating 0  0    Moving slowly or fidgety/restless 0  0    Suicidal thoughts 0  0    PHQ-9 Score 3  4    Difficult doing work/chores Somewhat difficult  Not difficult at all      Quality of Life:  Quality of Life - 06/06/21 1202       Quality of Life   Select Quality of Life      Quality of Life Scores   Health/Function Post 27.63 %    Socioeconomic Post 28.14 %    Psych/Spiritual Post 30 %    Family Post 23.75 %    GLOBAL Post 28.03 %             Personal Goals: Goals established at orientation with interventions provided to work toward goal.  Personal Goals and Risk Factors at Admission - 04/12/21 1112       Core Components/Risk Factors/Patient Goals on Admission    Weight Management Yes;Weight Maintenance;Weight Loss    Intervention Weight Management: Develop a combined nutrition and exercise program designed to reach desired caloric intake, while maintaining appropriate intake of nutrient and fiber, sodium and fats, and appropriate energy expenditure required for the weight goal.;Weight Management: Provide education and appropriate resources to help participant work on and attain dietary goals.  Expected Outcomes Weight Maintenance: Understanding of the daily nutrition guidelines, which includes 25-35% calories from fat, 7% or less cal from saturated fats, less than 238m cholesterol, less than 1.5gm of sodium, & 5 or more servings of fruits and vegetables daily;Long Term: Adherence to nutrition and physical activity/exercise program aimed toward attainment of established  weight goal;Short Term: Continue to assess and modify interventions until short term weight is achieved;Understanding recommendations for meals to include 15-35% energy as protein, 25-35% energy from fat, 35-60% energy from carbohydrates, less than 2079mof dietary cholesterol, 20-35 gm of total fiber daily;Weight Loss: Understanding of general recommendations for a balanced deficit meal plan, which promotes 1-2 lb weight loss per week and includes a negative energy balance of [phone] kcal/d;Understanding of distribution of calorie intake throughout the day with the consumption of 4-5 meals/snacks    Tobacco Cessation Yes    Number of packs per day Patient reports he has stopped today  and is wearing a nicotene patch. Given the phone number to 1-800-quit-now    Intervention Assist the participant in steps to quit. Provide individualized education and counseling about committing to Tobacco Cessation, relapse prevention, and pharmacological support that can be provided by physician.;OfAdvice workerassist with locating and accessing local/national Quit Smoking programs, and support quit date choice.    Expected Outcomes Short Term: Will demonstrate readiness to quit, by selecting a quit date.;Short Term: Will quit all tobacco product use, adhering to prevention of relapse plan.;Long Term: Complete abstinence from all tobacco products for at least 12 months from quit date.    Diabetes Yes    Intervention Provide education about signs/symptoms and action to take for hypo/hyperglycemia.;Provide education about proper nutrition, including hydration, and aerobic/resistive exercise prescription along with prescribed medications to achieve blood glucose in normal ranges: Fasting glucose 65-99 mg/dL    Expected Outcomes Short Term: Participant verbalizes understanding of the signs/symptoms and immediate care of hyper/hypoglycemia, proper foot care and importance of medication, aerobic/resistive exercise  and nutrition plan for blood glucose control.;Long Term: Attainment of HbA1C < 7%.    Hypertension Yes    Intervention Provide education on lifestyle modifcations including regular physical activity/exercise, weight management, moderate sodium restriction and increased consumption of fresh fruit, vegetables, and low fat dairy, alcohol moderation, and smoking cessation.;Monitor prescription use compliance.    Expected Outcomes Short Term: Continued assessment and intervention until BP is < 140/9043mG in hypertensive participants. < 130/33m25m in hypertensive participants with diabetes, heart failure or chronic kidney disease.;Long Term: Maintenance of blood pressure at goal levels.    Lipids Yes    Intervention Provide education and support for participant on nutrition & aerobic/resistive exercise along with prescribed medications to achieve LDL <70mg34mL >40mg.65mExpected Outcomes Short Term: Participant states understanding of desired cholesterol values and is compliant with medications prescribed. Participant is following exercise prescription and nutrition guidelines.;Long Term: Cholesterol controlled with medications as prescribed, with individualized exercise RX and with personalized nutrition plan. Value goals: LDL < 70mg, 58m> 40 mg.    Stress Yes    Intervention Offer individual and/or small group education and counseling on adjustment to heart disease, stress management and health-related lifestyle change. Teach and support self-help strategies.;Refer participants experiencing significant psychosocial distress to appropriate mental health specialists for further evaluation and treatment. When possible, include family members and significant others in education/counseling sessions.    Expected Outcomes Short Term: Participant demonstrates changes in health-related behavior, relaxation and other stress management skills, ability to  obtain effective social support, and compliance with  psychotropic medications if prescribed.;Long Term: Emotional wellbeing is indicated by absence of clinically significant psychosocial distress or social isolation.    Personal Goal Other Yes    Personal Goal Short: know limits to exercise, strength, flexibilty Long: longer walks without pain or fatigue, work in garden, endurance    Intervention Will continue to monitor pt and progress workloads as tolerated without sign or symptom    Expected Outcomes Pt will achieve his goals              Personal Goals Discharge:  Goals and Risk Factor Review     Row Name 04/24/21 1920 05/22/21 1004 06/08/21 1349         Core Components/Risk Factors/Patient Goals Review   Personal Goals Review Weight Management/Obesity;Lipids;Tobacco Cessation;Diabetes;Stress;Hypertension Weight Management/Obesity;Lipids;Tobacco Cessation;Diabetes;Stress;Hypertension Weight Management/Obesity;Lipids;Tobacco Cessation;Diabetes;Stress;Hypertension     Review [name] has completed 4 exercise sessions in cardiac rehab.  He continues to work toward tobacco cessation with reduction of cigarettes along with the addition of nicotine patch.  Steves blood sugars continue to be elevated despite increasing metformin to twice a day.  Recently farxiga was added, not enough time to assess the efficacy.  Vital signs remain acceptable. [name] has met with the RD to discuss ways to manage his weight loss, lipids and bp.  Reports improved mental well being with warmer spring like temperatures.  Anticipate that  [name] will continue to show  progress toward achieving program goals. [name] is off to a great start with excellent attendance and engaging in consistent home exercise. [name] reports less severity and the onset of pain in his legs and hip while walking the track.  Continues to use the nicotine patch and makes a conscious decision not to smoke.  Vital signs well within normal limits as well as decrease in fasting CBG at home since starting  farxiga. [name] admits he has some oopsy days with eating but overall adheres to a  heart healthy diet.  [name] is maintaing his weight with slight decrease of .4kg since beginning cardiac rehab. [name] graduates today with the completion of 24/24 sessions - perfect attendance! [name] understands the importance of activity particular with his legs to help with his PAD.  [name] has noticied that his pain level has decreased and he is able to walk further before he sensates pain and discomfort in his legs.  His hip discomfort has completed resolved.  [name] continues to use the 21 mg patch.  He did try to decrease to 40m  however the urges intensifed so he returned to the 21 mg patch with no urges.  Will give it some more time and try again.  SRichardson Landryalso uses nictotine gum when he has a craving and this has helped tremendoulsy. Vital signs including blood glucose readins are well within normal limits.  Fasting CBG continue to improve with the addition of Farixga.  SRichardson Landrywith a significant decrease in his stress level.  Looking forward to traveling when the weather moderates.     Expected Outcomes SRichardson Landrywill continue to participate in Cardiac rehab phase II for exercise, nutrition and the adaptation of heart healthy lifestyle modifications. SRichardson Landrywill continue to participate in Cardiac rehab phase II for exercise, nutrition and the adaptation of heart healthy lifestyle modifications. SRichardson Landrywill continue his home exercise using treadmill and eliptical seated stepper which he purchased.  Plans to exercise 5-7 days a week 30-45 minutes. I anticipate he will do well as he has notes great  benefit from exercising.  This was further reinterated when he went to see Dr. Audelia Acton who gave him a walking program to do for exercise.              Exercise Goals and Review:  Exercise Goals     Row Name 04/12/21 1020             Exercise Goals   Increase Physical Activity Yes       Intervention Provide advice, education,  support and counseling about physical activity/exercise needs.;Develop an individualized exercise prescription for aerobic and resistive training based on initial evaluation findings, risk stratification, comorbidities and participant's personal goals.       Expected Outcomes Short Term: Attend rehab on a regular basis to increase amount of physical activity.;Long Term: Add in home exercise to make exercise part of routine and to increase amount of physical activity.;Long Term: Exercising regularly at least 3-5 days a week.       Increase Strength and Stamina Yes       Intervention Provide advice, education, support and counseling about physical activity/exercise needs.;Develop an individualized exercise prescription for aerobic and resistive training based on initial evaluation findings, risk stratification, comorbidities and participant's personal goals.       Expected Outcomes Short Term: Increase workloads from initial exercise prescription for resistance, speed, and METs.;Short Term: Perform resistance training exercises routinely during rehab and add in resistance training at home;Long Term: Improve cardiorespiratory fitness, muscular endurance and strength as measured by increased METs and functional capacity (6MWT)       Able to understand and use rate of perceived exertion (RPE) scale Yes       Intervention Provide education and explanation on how to use RPE scale       Expected Outcomes Short Term: Able to use RPE daily in rehab to express subjective intensity level;Long Term:  Able to use RPE to guide intensity level when exercising independently       Knowledge and understanding of Target Heart Rate Range (THRR) Yes       Intervention Provide education and explanation of THRR including how the numbers were predicted and where they are located for reference       Expected Outcomes Short Term: Able to state/look up THRR;Long Term: Able to use THRR to govern intensity when exercising  independently;Short Term: Able to use daily as guideline for intensity in rehab       Understanding of Exercise Prescription Yes       Intervention Provide education, explanation, and written materials on patient's individual exercise prescription       Expected Outcomes Short Term: Able to explain program exercise prescription;Long Term: Able to explain home exercise prescription to exercise independently                Exercise Goals Re-Evaluation:  Exercise Goals Re-Evaluation     Row Name 04/16/21 0830 04/30/21 0830 05/30/21 0830 06/08/21 0830       Exercise Goal Re-Evaluation   Exercise Goals Review Increase Physical Activity;Increase Strength and Stamina;Able to understand and use rate of perceived exertion (RPE) scale;Knowledge and understanding of Target Heart Rate Range (THRR);Understanding of Exercise Prescription Increase Physical Activity;Increase Strength and Stamina;Able to understand and use rate of perceived exertion (RPE) scale;Knowledge and understanding of Target Heart Rate Range (THRR);Understanding of Exercise Prescription Increase Physical Activity;Increase Strength and Stamina;Able to understand and use rate of perceived exertion (RPE) scale;Knowledge and understanding of Target Heart Rate Range (THRR);Understanding of Exercise Prescription Increase  Physical Activity;Increase Strength and Stamina;Able to understand and use rate of perceived exertion (RPE) scale;Knowledge and understanding of Target Heart Rate Range (THRR);Understanding of Exercise Prescription    Comments Pt first day in the CRP2 program. Pt tolerated exercise well with chronic claudication pain 7/10 that was relieved with rest. Pt had an average MET level of 2.63 MET's. Pt is learning his THRR, RPE and ExRx. reviewed MET's, goals and home ExRx. Pt tolerated exercise well with chronic claudication pain 10/10 at times that was relieved with rest. Pt had an average MET level of 3.55. Pt will continue to  exercise on his own by walking. Pt is going to look at the company gym this week and may do a few sessions there before graduation, but he is walking for now daily. Pt feels good about his goals and is gaining strength and endurance, but is disheartened by not feeling up to working in his garden. He states he wants to, but his claudication pain limits too much. reviewed MET's, and goals. Pt tolerated exercise well with chronic claudication pain 6/10 at times that was relieved with rest. Pt had an average MET level of 3.91. Pt feels good about his goals and is gaining strength and endurance, Pt stated work and life are gettering much better. He feels much better about walking longer distances, his claudication pain is not as much of a limitor at work. Pt is also eager to start working out on his new recumbent stepper that should be coming in this week. Pt graduated the CRP2 program. Pt tolerated exercise well with chronic claudication pain 7/10 at times that was relieved with rest. Pt had an average MET level of 4.2. Will continue to exercise on his own 5-7 days a week for 30-45 mins per session by walking, treadmill and his home recumbent stepper. Pt is very happy with the progress he's made and feels much better about his claudication tolerance and walk test improvements.    Expected Outcomes Will continue to monitor pt and progress workloads as tolerated without sign or symptom Pt will continue to exercise on his own at home and possibly the gym 5-7 days 30-45 mins Will continue to monitor pt and progress workloads as tolerated without sign or symptom Will continue to monitor pt and progress workloads as tolerated without sign or symptom Pt will continue to exercise on his own and gain strength and claudication tolerance             Nutrition & Weight - Outcomes:  Pre Biometrics - 04/12/21 0956       Pre Biometrics   Waist Circumference 40.25 inches    Hip Circumference 38 inches    Waist to Hip  Ratio 1.06 %    Triceps Skinfold 12 mm    % Body Fat 26.6 %    Grip Strength 35 kg    Flexibility 13 in    Single Leg Stand 15 seconds              Nutrition:  Nutrition Therapy & Goals - 06/08/21 0756       Nutrition Therapy   Diet Heart Healthy Diet      Personal Nutrition Goals   Comments [name] remains extremely motivated to continue with lifestyle changes. He continues to reduce refined carbohydrate intake. Reviewed sources of saturated fat and high sodium foods; discussed alternative food choices to support heart health.      Intervention Plan   Intervention Prescribe, educate and counsel regarding individualized  specific dietary modifications aiming towards targeted core components such as weight, hypertension, lipid management, diabetes, heart failure and other comorbidities.;Nutrition handout(s) given to patient.    Expected Outcomes Short Term Goal: Understand basic principles of dietary content, such as calories, fat, sodium, cholesterol and nutrients.;Long Term Goal: Adherence to prescribed nutrition plan.             Nutrition Discharge:  Nutrition Assessments - 06/06/21 0946       Rate Your Plate Scores   Post Score 57             Education Questionnaire Score:  Knowledge Questionnaire Score - 06/06/21 0830       Knowledge Questionnaire Score   Post Score 22/24             Goals reviewed with patient. Pt graduated from cardiac rehab program today with completion of 24 exercise sessions in Phase II. Pt maintained good attendance and progressed nicely during his participation in rehab as evidenced by increased MET level.   Medication list reconciled. Repeat  PHQ score- decrease to 3 .  Pt has made significant lifestyle changes and should be commended for his success. Pt feels he has achieved his goals during cardiac rehab.   Pt plans to continue exercise with gym equipment he has available in his home 4-7 days a week. Cherre Huger,  BSN Cardiac and Training and development officer

## 2021-06-14 ENCOUNTER — Other Ambulatory Visit (HOSPITAL_COMMUNITY): Payer: Self-pay

## 2021-06-18 ENCOUNTER — Other Ambulatory Visit (HOSPITAL_COMMUNITY): Payer: Self-pay

## 2021-06-19 ENCOUNTER — Institutional Professional Consult (permissible substitution): Payer: No Typology Code available for payment source | Admitting: Cardiovascular Disease

## 2021-06-20 ENCOUNTER — Other Ambulatory Visit (HOSPITAL_COMMUNITY): Payer: Self-pay

## 2021-06-25 ENCOUNTER — Encounter: Payer: Self-pay | Admitting: Infectious Diseases

## 2021-06-26 ENCOUNTER — Encounter (HOSPITAL_COMMUNITY): Payer: Self-pay

## 2021-06-26 ENCOUNTER — Ambulatory Visit (HOSPITAL_COMMUNITY)
Admission: EM | Admit: 2021-06-26 | Discharge: 2021-06-26 | Disposition: A | Discharge status: Home / Self Care | Payer: No Typology Code available for payment source | Attending: Student | Admitting: Student

## 2021-06-26 DIAGNOSIS — G729 Myopathy, unspecified: Secondary | ICD-10-CM

## 2021-06-26 LAB — C-REACTIVE PROTEIN: CRP: 0.7 mg/dL (ref <1.0)

## 2021-06-26 LAB — SEDIMENTATION RATE: Sed Rate: 26 mm/hr — ABNORMAL HIGH (ref 0–16)

## 2021-06-26 LAB — CK: Total CK: 121 U/L (ref 49–397)

## 2021-06-26 MED ORDER — PREDNISONE 20 MG PO TABS: 40.0000 mg | ORAL_TABLET | Freq: Every day | ORAL | 0 refills | Status: AC

## 2021-06-26 MED ORDER — TIZANIDINE HCL 2 MG PO TABS: 2.0000 mg | ORAL_TABLET | Freq: Three times a day (TID) | ORAL | 0 refills | Status: DC | PRN

## 2021-06-26 NOTE — Discharge Instructions (Addendum)
-  Stop your statin. Let your PCP know that you have stopped it. This may result in increased cholesterol, which can have cardiac implications.  -Prednisone, 2 pills taken at the same time for 5 days in a row.  Try taking this earlier in the day as it can give you energy. Avoid NSAIDs like ibuprofen and alleve while taking this medication as they can increase your risk of stomach upset and even GI bleeding when in combination with a steroid. You can continue tylenol (acetaminophen) up to '1000mg'$  3x daily. -Start the muscle relaxer-Zanaflex (tizanidine), up to 3 times daily for muscle spasms and pain.  This can make you drowsy, so take at bedtime or when you do not need to drive or operate machinery. -Heating pad, lidocaine patches (while not using the heating pad), etc.  -We're checking for signs of muscle inflammation with some labs. We'll call later today if abnormal results.  -Head to ED if new or worsening symptoms - severe headaches, vision changes, dizziness, left arm pain at rest, left-sided chest pain, etc.

## 2021-06-26 NOTE — Telephone Encounter (Signed)
Per EMR patient did go to UC this am.

## 2021-06-26 NOTE — ED Triage Notes (Signed)
Pt states was in cardiac rehab for 8 weeks. States having bilateral shoulder pain, neck pain, and back pain since 4/23. States every day the pain gets worse and unable to do daily activities. Denies chest pain.taking tylenol and using patches with no relief.

## 2021-06-26 NOTE — ED Provider Notes (Signed)
[place]    CSN: [id number] Arrival date & time: 06/26/21  0801      History   Chief Complaint Chief Complaint  Patient presents with   Shoulder Pain    HPI Jacob Ali is a 61 y.o. male presenting with bilateral shoulder neck and back pain for 2 months.  History cardiac angioplasty, CAD, HIV, subdural hemorrhage.  He states that he was started on rosuvastatin for cholesterol 01/2021, and the current symptoms started 04/2021.  He was previously on atorvastatin, which was stopped due to myopathy.  He states that his symptoms began with neck pain, this has progressed to posterior shoulder pain and some lower back pain as well.  The symptoms are severe, keeping him up at night.  He states the neck feels very stiff, but there is no weakness in the arms or the legs.  He denies other symptoms including headaches, vision changes, dizziness, chest pain, jaw pain.  He has attempted to contact his primary care provider, they canceled his appointment in 10 days and so he presented to the urgent care as the pain is unbearable.  Has been attempting Tylenol and using lidocaine patches without relief.  Denies urinary symptoms like incontinence, retention, dark urine, malodorous urine.  HPI  Past Medical History:  Diagnosis Date   CAD in native artery 01/31/2021   Depression    GERD (gastroesophageal reflux disease)    HIV infection ([place])    Hyperlipidemia    Hypertension    Peripheral vascular disease (HCC)    PONV (postoperative nausea and vomiting)    S/P angioplasty with stent 01/30/21 to LAD  01/31/2021   Sleep apnea    Subdural hemorrhage ([place])    Tubular adenoma of colon 11/03/2017   Colonoscopy Sept 9, 2019    Patient Active Problem List   Diagnosis Date Noted   Sensation of fullness in both ears 04/05/2021   History of cocaine use 03/02/2021   CAD in native artery 01/31/2021   S/P angioplasty with stent 01/30/21 to LAD  01/31/2021   Unstable angina ([place]) 01/30/2021    Chest pain    B12 deficiency 09/13/2020   Family history of malignant neoplasm of digestive organs 08/08/2020   Rectal bleeding 08/08/2020   Body mass index (BMI) 27.0-27.9, adult 04/07/2020   Contusion 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[place]) 04/07/2020   Laceration of scalp without complication 78/67/6720   Subdural hematoma 03/23/2020   Colon cancer screening 05/26/2019   Tubular adenoma of colon 11/03/2017   Type 2 diabetes mellitus ([place]) 06/18/2016   Hepatitis B immune 03/11/2016   Atherosclerosis with claudication of extremity ([place]) 01/29/2016   Arthralgia 10/26/2014   Gastroesophageal reflux disease 06/23/2013   Tobacco use disorder 04/27/2013   Erectile dysfunction 10/27/2012   Insomnia 04/15/2012   Generalized anxiety disorder 02/26/2011    Class: Acute   Suicide attempt ([place]) 02/20/2011   HIV (human immunodeficiency virus infection) ([place]) 11/01/2010   Essential (primary) hypertension 11/01/2010   Hyperlipidemia 11/01/2010    Past Surgical History:  Procedure Laterality Date   CARDIAC CATHETERIZATION     CORONARY STENT INTERVENTION N/A 01/30/2021   Procedure: CORONARY STENT INTERVENTION;  Surgeon: Belva Crome, MD;  Location: [facility];  Service: Cardiovascular;  Laterality: N/A;   Examination under anesthesia, repair of anal fissure,  12/04/2000   LEFT HEART CATH AND CORONARY ANGIOGRAPHY N/A 01/30/2021   Procedure: LEFT HEART CATH AND CORONARY ANGIOGRAPHY;  Surgeon: Belva Crome, MD;  Location: [facility];  Service: Cardiovascular;  Laterality: N/A;   PERIPHERAL VASCULAR CATHETERIZATION Left 02/06/2016   Procedure: Lower Extremity Angiography;  Surgeon: Katha Cabal, MD;  Location: [facility];  Service: Cardiovascular;  Laterality: Left;   PERIPHERAL VASCULAR CATHETERIZATION N/A 02/06/2016   Procedure: Abdominal Aortogram w/Lower Extremity;  Surgeon: Katha Cabal, MD;  Location: [facility];  Service: Cardiovascular;  Laterality: N/A;    PERIPHERAL VASCULAR CATHETERIZATION  02/06/2016   Procedure: Lower Extremity Intervention;  Surgeon: Katha Cabal, MD;  Location: [facility];  Service: Cardiovascular;;       Home Medications    Prior to Admission medications   Medication Sig Start Date End Date Taking? Authorizing Provider  predniSONE (DELTASONE) 20 MG tablet Take 2 tablets (40 mg total) by mouth daily for 5 days. Take with breakfast or lunch. Avoid NSAIDs (ibuprofen, etc) while taking this medication. 06/26/21 07/01/21 Yes Hazel Sams, PA-C  tiZANidine (ZANAFLEX) 2 MG tablet Take 1 tablet (2 mg total) by mouth every 8 (eight) hours as needed for muscle spasms. 06/26/21  Yes Hazel Sams, PA-C  ACCU-CHEK GUIDE test strip USE UP TO 4 TIMES A DAY AS DIRECTED 10/18/20   Pleas Koch, NP  Accu-Chek Softclix Lancets lancets USE UP TO 4 TIMES A DAY AS DIRECTED 06/21/19   Pleas Koch, NP  acetaminophen (TYLENOL) 500 MG tablet Take 1,000 mg by mouth every 6 (six) hours as needed for moderate pain.    [provider]  aspirin 81 MG chewable tablet Chew 1 tablet (81 mg total) by mouth daily. 02/01/21   Isaiah Serge, NP  bictegravir-emtricitabine-tenofovir AF (BIKTARVY) 50-200-25 MG TABS tablet TAKE 1 TABLET BY MOUTH DAILY. 02/09/21 02/09/22  Campbell Riches, MD  carvedilol (COREG) 6.25 MG tablet TAKE 1 TABLET BY MOUTH 2 TIMES DAILY WITH A MEAL. 05/10/21   [name], MD  clopidogrel (PLAVIX) 75 MG tablet TAKE 1 TABLET BY MOUTH EVERY DAY 04/16/21   Kris Hartmann, NP  dapagliflozin propanediol (FARXIGA) 10 MG TABS tablet Take 1 tablet (10 mg total) by mouth daily before breakfast. 04/20/21   [name], MD  diphenhydramine-acetaminophen (TYLENOL PM) 25-500 MG TABS tablet Take 1 tablet by mouth at bedtime as needed (sleep).    [provider]  fluticasone (FLONASE) 50 MCG/ACT nasal spray Place 1 spray into both nostrils 2 (two) times daily. 04/05/21   Pleas Koch, NP   lisinopril (ZESTRIL) 20 MG tablet Take 1 tablet (20 mg total) by mouth daily. 05/04/21   [name], MD  metFORMIN (GLUCOPHAGE-XR) 500 MG 24 hr tablet Take 1 tablet (500 mg total) by mouth daily with breakfast. for diabetes. 05/18/21   Pleas Koch, NP  nicotine (NICODERM CQ - DOSED IN MG/24 HOURS) 21 mg/24hr patch Place 1 patch (21 mg total) onto the skin daily. 05/31/21   Pleas Koch, NP  nitroGLYCERIN (NITROSTAT) 0.4 MG SL tablet Place 1 tablet (0.4 mg total) under the tongue every 5 (five) minutes x 3 doses as needed for chest pain. 01/31/21   Isaiah Serge, NP  pantoprazole (PROTONIX) 40 MG tablet Take 1 tablet (40 mg total) by mouth daily. For heartburn. 07/06/20   Pleas Koch, NP  rosuvastatin (CRESTOR) 40 MG tablet Take 1 tablet (40 mg total) by mouth daily. 02/01/21   Isaiah Serge, NP  temazepam (RESTORIL) 7.5 MG capsule TAKE 1 CAPSULE BY MOUTH EVERY DAY AT BEDTIME AS NEEDED SLEEP 05/18/21  Campbell Riches, MD    Family History Family History  Problem Relation Age of Onset   Hypertension Mother    Asthma Mother    Throat cancer Father    Heart disease Father    Cancer Father        bladder   Cancer Maternal Grandmother        colon    Social History Social History   Tobacco Use   Smoking status: Every Day    Packs/day: 0.50    Years: 40.00    Total pack years: 20.00    Types: Cigars, Cigarettes   Smokeless tobacco: Never   Tobacco comments:    1/2PPD Mr Catherman was wearing a nicotine patch today and says he plans to quit smoking. Given the phone number to 1-800=quit-now   Vaping Use   Vaping Use: Former  Substance Use Topics   Alcohol use: Yes    Alcohol/week: 2.0 standard drinks of alcohol    Types: 2 Standard drinks or equivalent per week    Comment: 1-2 times a week   Drug use: Not Currently    Types: Marijuana    Comment: 2 per month     Allergies   Atorvastatin and Tamiflu [oseltamivir phosphate]   Review of  Systems Review of Systems  Constitutional:  Negative for chills, fever and unexpected weight change.  Respiratory:  Negative for chest tightness and shortness of breath.   Cardiovascular:  Negative for chest pain and palpitations.  Gastrointestinal:  Negative for abdominal pain, diarrhea, nausea and vomiting.  Genitourinary:  Negative for decreased urine volume, difficulty urinating and frequency.  Musculoskeletal:  Positive for back pain and neck pain. Negative for arthralgias, gait problem, joint swelling, myalgias and neck stiffness.  Skin:  Negative for wound.  Neurological:  Negative for dizziness, tremors, seizures, syncope, facial asymmetry, speech difficulty, weakness, light-headedness, numbness and headaches.  All other systems reviewed and are negative.    Physical Exam Triage Vital Signs ED Triage Vitals  Enc Vitals Group     BP 06/26/21 0821 128/70     Pulse Rate 06/26/21 0821 83     Resp 06/26/21 0821 18     Temp 06/26/21 0821 98.2 F (36.8 C)     Temp Source 06/26/21 0821 Oral     SpO2 06/26/21 0821 90 %     Weight --      Height --      Head Circumference --      Peak Flow --      Pain Score 06/26/21 0822 8     Pain Loc --      Pain Edu? --      Excl. in [place]? --    No data found.  Updated Vital Signs BP 128/70 (BP Location: Right Arm)   Pulse 83   Temp 98.2 F (36.8 C) (Oral)   Resp 18   SpO2 90%   Visual Acuity Right Eye Distance:   Left Eye Distance:   Bilateral Distance:    Right Eye Near:   Left Eye Near:    Bilateral Near:     Physical Exam Vitals reviewed.  Constitutional:      General: He is not in acute distress.    Appearance: Normal appearance. He is not ill-appearing.     Comments: Patient resting comfortably and ambulated into room unaided and without difficulty.   HENT:     Head: Normocephalic and atraumatic.  Cardiovascular:     Rate and Rhythm: Normal  rate and regular rhythm.     Heart sounds: Normal heart sounds.   Pulmonary:     Effort: Pulmonary effort is normal.     Breath sounds: Normal breath sounds and air entry.  Abdominal:     Tenderness: There is no abdominal tenderness. There is no right CVA tenderness, left CVA tenderness, guarding or rebound.  Musculoskeletal:     Cervical back: Normal range of motion. Spasms and tenderness present. No swelling, deformity, signs of trauma, rigidity, bony tenderness or crepitus. No pain with movement.     Thoracic back: No swelling, deformity, signs of trauma, spasms, tenderness or bony tenderness. Normal range of motion. No scoliosis.     Lumbar back: Spasms and tenderness present. No swelling, deformity, signs of trauma or bony tenderness. Normal range of motion. Negative right straight leg raise test and negative left straight leg raise test. No scoliosis.     Comments: There is paraspinous hypertonicity in the bilateral cervical muscles, and the proximal trapezius bilaterally. No midline spinous tenderness, deformity, stepoff.  Bilateral shoulder pain is elicited with abduction of arms, but there is no weakness, and range of motion is intact.  Gait intact.  Strength and sensation intact upper and lower extremities.  No saddle anesthesia.  Absolutely no other injury, deformity, tenderness, ecchymosis, abrasion.  Neurological:     General: No focal deficit present.     Mental Status: He is alert.     Cranial Nerves: No cranial nerve deficit.     Comments: PERRLA, EOMI. Visual acuity grossly intact.   Psychiatric:        Mood and Affect: Mood normal.        Behavior: Behavior normal.        Thought Content: Thought content normal.        Judgment: Judgment normal.      UC Treatments / Results  Labs (all labs ordered are listed, but only abnormal results are displayed) Labs Reviewed  SEDIMENTATION RATE  C-REACTIVE PROTEIN  CK    EKG   Radiology No results found.  Procedures Procedures (including critical care time)  Medications Ordered  in UC Medications - No data to display  Initial Impression / Assessment and Plan / UC Course  I have reviewed the triage vital signs and the nursing notes.  Pertinent labs & imaging results that were available during my care of the patient were reviewed by me and considered in my medical decision making (see chart for details).     This patient is a very pleasant 61 y.o. year old male presenting with cervical paraspinous and proximal trapezius stiffness and pain x2 months. Neurovascularly and neurologically intact, no recent trauma or falls. There is no associated headaches, vision changes, chest pain. The left arm pain is reproducible.  This patient was started on rosuvastatin 6 months ago, prior to this he was taking atorvastatin which was stopped due to myopathy.  Differential today is likely myopathy due to statin, so he will stop the statin.  Differential also includes polymyalgia rheumatica, rhabdomyolysis; we will check an ESR, CRP, and CK.  Prednisone and Zanaflex sent, and he can continue Tylenol and lidocaine patches.  As he does have cardiovascular disease, we discussed that stopping the statin will likely increase his cholesterol, which could have cardiac implications.  I strongly advised he contact his primary care provider today to discuss this further.  He verbalizes understanding and agreement.  We did not check a cervical spine x-ray today as there is no  midline spinous tenderness, deformity, step-off; he is in agreement.  ED return precautions discussed. Patient verbalizes understanding and agreement.   Final Clinical Impressions(s) / UC Diagnoses   Final diagnoses:  Myopathy     Discharge Instructions      -Stop your statin. Let your PCP know that you have stopped it. This may result in increased cholesterol, which can have cardiac implications.  -Prednisone, 2 pills taken at the same time for 5 days in a row.  Try taking this earlier in the day as it can give you energy.  Avoid NSAIDs like ibuprofen and alleve while taking this medication as they can increase your risk of stomach upset and even GI bleeding when in combination with a steroid. You can continue tylenol (acetaminophen) up to 1014m 3x daily. -Start the muscle relaxer-Zanaflex (tizanidine), up to 3 times daily for muscle spasms and pain.  This can make you drowsy, so take at bedtime or when you do not need to drive or operate machinery. -Heating pad, lidocaine patches (while not using the heating pad), etc.  -We're checking for signs of muscle inflammation with some labs. We'll call later today if abnormal results.  -Head to ED if new or worsening symptoms - severe headaches, vision changes, dizziness, left arm pain at rest, left-sided chest pain, etc.    ED Prescriptions     Medication Sig Dispense Auth. Provider   predniSONE (DELTASONE) 20 MG tablet Take 2 tablets (40 mg total) by mouth daily for 5 days. Take with breakfast or lunch. Avoid NSAIDs (ibuprofen, etc) while taking this medication. 10 tablet GHazel Sams PA-C   tiZANidine (ZANAFLEX) 2 MG tablet Take 1 tablet (2 mg total) by mouth every 8 (eight) hours as needed for muscle spasms. 21 tablet GHazel Sams PA-C      I have reviewed the PDMP during this encounter.   GHazel Sams PA-C 06/26/21 [phone]

## 2021-06-27 ENCOUNTER — Encounter: Payer: Self-pay | Admitting: Infectious Diseases

## 2021-06-27 ENCOUNTER — Encounter (INDEPENDENT_AMBULATORY_CARE_PROVIDER_SITE_OTHER): Payer: No Typology Code available for payment source | Admitting: Cardiology

## 2021-06-27 DIAGNOSIS — M25512 Pain in left shoulder: Secondary | ICD-10-CM | POA: Diagnosis not present

## 2021-06-27 DIAGNOSIS — M25511 Pain in right shoulder: Secondary | ICD-10-CM

## 2021-06-27 DIAGNOSIS — E782 Mixed hyperlipidemia: Secondary | ICD-10-CM

## 2021-06-27 DIAGNOSIS — I2510 Atherosclerotic heart disease of native coronary artery without angina pectoris: Secondary | ICD-10-CM

## 2021-06-27 DIAGNOSIS — Z79899 Other long term (current) drug therapy: Secondary | ICD-10-CM

## 2021-06-27 DIAGNOSIS — Z789 Other specified health status: Secondary | ICD-10-CM

## 2021-06-27 NOTE — Telephone Encounter (Signed)
Please see the MyChart message reply(ies) for my assessment and plan.    This patient gave consent for this Medical Advice Message and is aware that it may result in a bill to [employer], as well as the possibility of receiving a bill for a co-payment or deductible. They are an established patient, but are not seeking medical advice exclusively about a problem treated during an in person or video visit in the last seven days. I did not recommend an in person or video visit within seven days of my reply.    I spent a total of 6 minutes cumulative time within 7 days through [employer].  [name], MD

## 2021-06-28 NOTE — Telephone Encounter (Signed)
Referral to lipid clinic placed and mychart message sent to the pt about referral and [facility] Scheduling will be reaching out to him soon to coordinate this appt.  This is for known CAD and statin intolerance.

## 2021-06-28 NOTE — Addendum Note (Signed)
Addended by: Nuala Alpha on: 06/28/2021 11:26 AM   Modules accepted: Orders

## 2021-06-28 NOTE — Telephone Encounter (Signed)
Pts PharmD appt for lipid clinic is scheduled for 07/24/21 at 3 pm.  Pt made aware of appt date and time by Scheduling dept.

## 2021-07-05 ENCOUNTER — Other Ambulatory Visit: Payer: Self-pay | Admitting: Cardiology

## 2021-07-05 ENCOUNTER — Other Ambulatory Visit (HOSPITAL_COMMUNITY): Payer: Self-pay

## 2021-07-05 ENCOUNTER — Other Ambulatory Visit: Payer: Self-pay | Admitting: Primary Care

## 2021-07-05 DIAGNOSIS — E1165 Type 2 diabetes mellitus with hyperglycemia: Secondary | ICD-10-CM

## 2021-07-06 ENCOUNTER — Ambulatory Visit: Payer: No Typology Code available for payment source | Admitting: Primary Care

## 2021-07-09 ENCOUNTER — Ambulatory Visit (INDEPENDENT_AMBULATORY_CARE_PROVIDER_SITE_OTHER): Payer: No Typology Code available for payment source | Admitting: Vascular Surgery

## 2021-07-09 ENCOUNTER — Encounter (INDEPENDENT_AMBULATORY_CARE_PROVIDER_SITE_OTHER): Payer: No Typology Code available for payment source

## 2021-07-12 ENCOUNTER — Encounter: Payer: Self-pay | Admitting: Primary Care

## 2021-07-12 ENCOUNTER — Ambulatory Visit (INDEPENDENT_AMBULATORY_CARE_PROVIDER_SITE_OTHER): Payer: No Typology Code available for payment source | Admitting: Primary Care

## 2021-07-12 ENCOUNTER — Other Ambulatory Visit (HOSPITAL_COMMUNITY): Payer: Self-pay

## 2021-07-12 ENCOUNTER — Ambulatory Visit (INDEPENDENT_AMBULATORY_CARE_PROVIDER_SITE_OTHER)
Admission: RE | Admit: 2021-07-12 | Discharge: 2021-07-12 | Disposition: A | Discharge status: Home / Self Care | Payer: No Typology Code available for payment source | Source: Ambulatory Visit | Attending: Primary Care | Admitting: Primary Care

## 2021-07-12 VITALS — BP 130/60 | HR 85 | Temp 98.2°F | Ht 69.0 in | Wt 189.0 lb

## 2021-07-12 DIAGNOSIS — M25512 Pain in left shoulder: Secondary | ICD-10-CM

## 2021-07-12 DIAGNOSIS — M25511 Pain in right shoulder: Secondary | ICD-10-CM

## 2021-07-12 DIAGNOSIS — G8929 Other chronic pain: Secondary | ICD-10-CM | POA: Diagnosis not present

## 2021-07-12 DIAGNOSIS — E1165 Type 2 diabetes mellitus with hyperglycemia: Secondary | ICD-10-CM

## 2021-07-12 DIAGNOSIS — M542 Cervicalgia: Secondary | ICD-10-CM

## 2021-07-12 LAB — POCT GLYCOSYLATED HEMOGLOBIN (HGB A1C): HbA1c, POC (controlled diabetic range): 5.8 % (ref 0.0–7.0)

## 2021-07-12 MED ORDER — PREDNISONE 20 MG PO TABS: ORAL_TABLET | ORAL | 0 refills | Status: DC

## 2021-07-12 NOTE — Assessment & Plan Note (Addendum)
Improved and controlled with A1C of 5.8 today!  Continue metformin XR 500 mg daily, Farxiga 10 mg daily.  Managed on statin and ACE-I.  Pneumonia vaccine UTD. Eye exam UTD.  Follow up in 6 months

## 2021-07-12 NOTE — Assessment & Plan Note (Addendum)
Exam and HPI today suspicious for bursitis, especially as symptoms began in cardiac rehab while lifting weights. No weakness or alarm signs.  Start prednisone taper course x 8 days. Checking plain films of the cervical spine.  Consider sports med vs ortho vs PT referrals. He may require cortisone injections.  Await results.

## 2021-07-12 NOTE — Assessment & Plan Note (Signed)
Acute on chronic flare.  Checking plain films of the cervical spine. Rx for prednisone course sent to pharmacy.  Consider ortho vs PT referrals. Await results and treatment.

## 2021-07-12 NOTE — Progress Notes (Signed)
Subjective:    Patient ID: Jacob Ali, male    DOB: 1960/07/10, 61 y.o.   MRN: [id number]  HPI  Jacob Ali is a very pleasant 61 y.o. male with a history of hypertension, CAD, type 2 diabetes, hyperlipidemia, HIV, tobacco abuse who presents today for type 2 diabetes and for urgent care follow up.  1) Type 2 Diabetes:  Current medications include: Metformin XR 500 mg daily, Farxiga 10 mg daily  He/She is checking his/her blood glucose 2 times daily and is getting readings of:  AM fasting: 140's Before dinner: 120's  Last A1C: 6.6 in March 2023, 5.8 today Last Eye Exam: Due Last Foot Exam: Due  Pneumonia Vaccination: 2020 Urine Microalbumin: None. Managed on ACE-I Statin: Crestor   Dietary changes since last visit: None.    Exercise: None.   BP Readings from Last 3 Encounters:  07/12/21 130/60  06/26/21 128/70  06/05/21 140/72   2) Acute on Chronic Neck/Shoulder Pain: Chronic for years, intermittent. Currently located to the base of the neck and bilateral shoulders with radiation down to his arms bilaterally. Recent symptoms began two months ago during cardiac rehab, worse over the last 2 weeks.   He presented to Urgent Care on 06/26/21, diagnosed with "myopathy" and was told to hold his statin. He underwent labs including CK, Sed rate, C-reactive protein, sed rate was slightly elevated.   He was treated with a five day course of prednisone 40 mg daily and Flexeril 5 mg PRN. He felt near complete resolve in his symptoms while on prednisone, but his symptoms came back "full force" as soon as he finished. He doesn't think that the Tizanidine helped with his symptoms.   Chronic neck pain for years. He doesn't recall ever having an xray of the neck. He denies trauma.     Review of Systems  Respiratory:  Negative for shortness of breath.   Cardiovascular:  Negative for chest pain.  Musculoskeletal:  Positive for arthralgias and neck pain.  Neurological:  Positive  for numbness.         Past Medical History:  Diagnosis Date   CAD in native artery 01/31/2021   Depression    GERD (gastroesophageal reflux disease)    HIV infection (HCC)    Hyperlipidemia    Hypertension    Peripheral vascular disease (HCC)    PONV (postoperative nausea and vomiting)    S/P angioplasty with stent 01/30/21 to LAD  01/31/2021   Sleep apnea    Subdural hemorrhage ([place])    Tubular adenoma of colon 11/03/2017   Colonoscopy Sept 9, 2019    Social History   Socioeconomic History   Marital status: Single    Spouse name: Not on file   Number of children: Not on file   Years of education: 12   Highest education level: High school graduate  Occupational History   Occupation: Recruitment consultant: [employer]  Tobacco Use   Smoking status: Every Day    Packs/day: 0.50    Years: 40.00    Total pack years: 20.00    Types: Cigars, Cigarettes   Smokeless tobacco: Never   Tobacco comments:    1/2PPD Mr Mahabir was wearing a nicotine patch today and says he plans to quit smoking. Given the phone number to 1-800=quit-now   Vaping Use   Vaping Use: Former  Substance and Sexual Activity   Alcohol use: Yes    Alcohol/week: 2.0 standard drinks of alcohol  Types: 2 Standard drinks or equivalent per week    Comment: 1-2 times a week   Drug use: Not Currently    Types: Marijuana    Comment: 2 per month   Sexual activity: Not Currently    Partners: Male    Birth control/protection: Condom    Comment: pt. given condoms  Other Topics Concern   Not on file  Social History Narrative   Single, lives alone.     Social Determinants of Health   Financial Resource Strain: Not on file  Food Insecurity: Not on file  Transportation Needs: Not on file  Physical Activity: Not on file  Stress: Not on file  Social Connections: Not on file  Intimate Partner Violence: Not on file    Past Surgical History:  Procedure Laterality Date   CARDIAC CATHETERIZATION      CORONARY STENT INTERVENTION N/A 01/30/2021   Procedure: CORONARY STENT INTERVENTION;  Surgeon: Belva Crome, MD;  Location: [facility];  Service: Cardiovascular;  Laterality: N/A;   Examination under anesthesia, repair of anal fissure,  12/04/2000   LEFT HEART CATH AND CORONARY ANGIOGRAPHY N/A 01/30/2021   Procedure: LEFT HEART CATH AND CORONARY ANGIOGRAPHY;  Surgeon: Belva Crome, MD;  Location: [facility];  Service: Cardiovascular;  Laterality: N/A;   PERIPHERAL VASCULAR CATHETERIZATION Left 02/06/2016   Procedure: Lower Extremity Angiography;  Surgeon: Katha Cabal, MD;  Location: [facility];  Service: Cardiovascular;  Laterality: Left;   PERIPHERAL VASCULAR CATHETERIZATION N/A 02/06/2016   Procedure: Abdominal Aortogram w/Lower Extremity;  Surgeon: Katha Cabal, MD;  Location: [facility];  Service: Cardiovascular;  Laterality: N/A;   PERIPHERAL VASCULAR CATHETERIZATION  02/06/2016   Procedure: Lower Extremity Intervention;  Surgeon: Katha Cabal, MD;  Location: [facility];  Service: Cardiovascular;;    Family History  Problem Relation Age of Onset   Hypertension Mother    Asthma Mother    Throat cancer Father    Heart disease Father    Cancer Father        bladder   Cancer Maternal Grandmother        colon    Allergies  Allergen Reactions   Atorvastatin Other (See Comments)    myalgias   Tamiflu [Oseltamivir Phosphate] Other (See Comments)    Depressive symptoms    Current Outpatient Medications on File Prior to Visit  Medication Sig Dispense Refill   ACCU-CHEK GUIDE test strip USE UP TO 4 TIMES A DAY AS DIRECTED 100 strip 5   Accu-Chek Softclix Lancets lancets USE UP TO 4 TIMES A DAY AS DIRECTED 100 each 5   acetaminophen (TYLENOL) 500 MG tablet Take 1,000 mg by mouth every 6 (six) hours as needed for moderate pain.     aspirin 81 MG chewable tablet Chew 1 tablet (81 mg total) by mouth daily.      bictegravir-emtricitabine-tenofovir AF (BIKTARVY) 50-200-25 MG TABS tablet TAKE 1 TABLET BY MOUTH DAILY. 30 tablet 6   carvedilol (COREG) 6.25 MG tablet TAKE 1 TABLET BY MOUTH 2 TIMES DAILY WITH A MEAL. 180 tablet 3   clopidogrel (PLAVIX) 75 MG tablet TAKE 1 TABLET BY MOUTH EVERY DAY 90 tablet 2   dapagliflozin propanediol (FARXIGA) 10 MG TABS tablet Take 1 tablet (10 mg total) by mouth daily before breakfast. 30 tablet 6   diphenhydramine-acetaminophen (TYLENOL PM) 25-500 MG TABS tablet Take 1 tablet by mouth at bedtime as needed (sleep).     fluticasone (  FLONASE) 50 MCG/ACT nasal spray Place 1 spray into both nostrils 2 (two) times daily. 48 g 1   lisinopril (ZESTRIL) 20 MG tablet Take 1 tablet (20 mg total) by mouth daily. 90 tablet 3   metFORMIN (GLUCOPHAGE-XR) 500 MG 24 hr tablet Take 1 tablet (500 mg total) by mouth daily with breakfast. for diabetes. 90 tablet 1   nitroGLYCERIN (NITROSTAT) 0.4 MG SL tablet Place 1 tablet (0.4 mg total) under the tongue every 5 (five) minutes x 3 doses as needed for chest pain. 25 tablet 4   pantoprazole (PROTONIX) 40 MG tablet Take 1 tablet (40 mg total) by mouth daily. For heartburn. 90 tablet 3   rosuvastatin (CRESTOR) 40 MG tablet TAKE 1 TABLET BY MOUTH EVERY DAY 90 tablet 3   temazepam (RESTORIL) 7.5 MG capsule TAKE 1 CAPSULE BY MOUTH EVERY DAY AT BEDTIME AS NEEDED SLEEP 30 capsule 0   No current facility-administered medications on file prior to visit.    BP 130/60   Pulse 85   Temp 98.2 F (36.8 C) (Oral)   Ht '5\' 9"'$  (1.753 m)   Wt 189 lb (85.7 kg)   SpO2 95%   BMI 27.91 kg/m  Objective:   Physical Exam Neck:     Comments: Movement pain with left lateral neck rotation Cardiovascular:     Rate and Rhythm: Normal rate and regular rhythm.  Pulmonary:     Effort: Pulmonary effort is normal.     Breath sounds: Normal breath sounds. No wheezing or rales.  Musculoskeletal:     Right shoulder: No bony tenderness. Decreased range of motion.  Normal strength.     Left shoulder: No bony tenderness. Decreased range of motion. Normal strength.     Cervical back: Normal range of motion and neck supple. Pain with movement present. No spinous process tenderness or muscular tenderness. Normal range of motion.     Comments: Decrease in ROM with pain to bilateral shoulders with abduction. Negative empty can test.  Skin:    General: Skin is warm and dry.  Neurological:     Mental Status: He is alert and oriented to person, place, and time.           Assessment & Plan:   Problem List Items Addressed This Visit       Endocrine   Type 2 diabetes mellitus ([place]) - Primary    Improved and controlled with A1C of 5.8 today!  Continue metformin XR 500 mg daily, Farxiga 10 mg daily.  Managed on statin and ACE-I.  Pneumonia vaccine UTD. Eye exam UTD.  Follow up in 6 months      Relevant Orders   POC HgB A1c (Completed)     Other   Chronic neck pain    Acute on chronic flare.  Checking plain films of the cervical spine. Rx for prednisone course sent to pharmacy.  Consider ortho vs PT referrals. Await results and treatment.       Relevant Medications   predniSONE (DELTASONE) 20 MG tablet   Other Relevant Orders   DG Cervical Spine Complete   Chronic pain of both shoulders    Exam and HPI today suspicious for bursitis, especially as symptoms began in cardiac rehab while lifting weights. No weakness or alarm signs.  Start prednisone taper course x 8 days. Checking plain films of the cervical spine.  Consider sports med vs ortho vs PT referrals. He may require cortisone injections.  Await results.       Relevant  Medications   predniSONE (DELTASONE) 20 MG tablet       Pleas Koch, NP

## 2021-07-12 NOTE — Patient Instructions (Addendum)
Complete xray(s) prior to leaving today. I will notify you of your results once received.  Start prednisone tablets. Take two tablets my mouth once daily for four days, then one tablet once daily for four days.   Consider setting up a visit with Dr. Lorelei Pont as discussed.   Please schedule a physical to meet with me in 6 months.   It was a pleasure to see you today!

## 2021-07-16 ENCOUNTER — Other Ambulatory Visit (HOSPITAL_COMMUNITY): Payer: Self-pay

## 2021-07-19 ENCOUNTER — Other Ambulatory Visit (HOSPITAL_COMMUNITY): Payer: Self-pay

## 2021-07-23 ENCOUNTER — Ambulatory Visit: Payer: No Typology Code available for payment source | Admitting: Family Medicine

## 2021-07-23 ENCOUNTER — Encounter: Payer: Self-pay | Admitting: Family Medicine

## 2021-07-23 VITALS — BP 130/60 | HR 87 | Temp 99.0°F | Ht 69.0 in | Wt 187.0 lb

## 2021-07-23 DIAGNOSIS — M7582 Other shoulder lesions, left shoulder: Secondary | ICD-10-CM

## 2021-07-23 DIAGNOSIS — M25812 Other specified joint disorders, left shoulder: Secondary | ICD-10-CM | POA: Diagnosis not present

## 2021-07-23 DIAGNOSIS — M25811 Other specified joint disorders, right shoulder: Secondary | ICD-10-CM

## 2021-07-23 DIAGNOSIS — M7581 Other shoulder lesions, right shoulder: Secondary | ICD-10-CM | POA: Diagnosis not present

## 2021-07-23 NOTE — Progress Notes (Unsigned)
    Jacob Brabson T. Aldea Avis, MD, [place] at [facility] [place], [address]  Phone: [phone]  FAX: [place] - 61 y.o. male  MRN [id number]  Date of Birth: 1960-06-02  Date: 07/23/2021  PCP: Pleas Koch, NP  Referral: Pleas Koch, NP  Chief Complaint  Patient presents with   Shoulder Pain    Radiates down to elbows   Subjective:   Jacob Ali is a 61 y.o. very pleasant male patient with Body mass index is 27.62 kg/m. who presents with the following:  When in cardiac rehab, was doing an elliptical.  Then shoulder started - to the elbow.  Will have some pain in abduction and pain with IROM.  Has taken 2 rounds of steroids.  UC thought maybe myopathy - but not better.  No old shoulder injury or broken or surgery.  Lab Results  Component Value Date   HGBA1C 5.8 07/12/2021      Review of Systems is noted in the HPI, as appropriate  Objective:   BP 130/60   Pulse 87   Temp 99 F (37.2 C) (Oral)   Ht '5\' 9"'$  (1.753 m)   Wt 187 lb (84.8 kg)   SpO2 93%   BMI 27.62 kg/m   GEN: No acute distress; alert,appropriate. PULM: Breathing comfortably in no respiratory distress PSYCH: Normally interactive.   Laboratory and Imaging Data:  Assessment and Plan:   ***

## 2021-07-24 ENCOUNTER — Ambulatory Visit: Payer: No Typology Code available for payment source

## 2021-08-07 ENCOUNTER — Other Ambulatory Visit (HOSPITAL_COMMUNITY): Payer: Self-pay

## 2021-08-13 ENCOUNTER — Other Ambulatory Visit (HOSPITAL_COMMUNITY): Payer: Self-pay

## 2021-09-04 ENCOUNTER — Other Ambulatory Visit: Payer: Self-pay | Admitting: Infectious Diseases

## 2021-09-04 ENCOUNTER — Other Ambulatory Visit (HOSPITAL_COMMUNITY): Payer: Self-pay

## 2021-09-04 DIAGNOSIS — B20 Human immunodeficiency virus [HIV] disease: Secondary | ICD-10-CM

## 2021-09-05 ENCOUNTER — Other Ambulatory Visit (HOSPITAL_COMMUNITY): Payer: Self-pay

## 2021-09-05 MED ORDER — BIKTARVY 50-200-25 MG PO TABS
1.0000 | ORAL_TABLET | Freq: Every day | ORAL | 6 refills | Status: DC
  Filled 2021-09-05: qty 30, 30d supply, fill #0
  Filled 2021-10-05: qty 30, 30d supply, fill #1
  Filled 2021-11-06: qty 30, 30d supply, fill #2
  Filled 2021-12-07: qty 30, 30d supply, fill #3
  Filled 2022-01-10: qty 30, 30d supply, fill #4
  Filled 2022-02-07: qty 30, 30d supply, fill #5
  Filled 2022-03-05: qty 30, 30d supply, fill #6

## 2021-09-10 ENCOUNTER — Other Ambulatory Visit (HOSPITAL_COMMUNITY): Payer: Self-pay

## 2021-10-01 ENCOUNTER — Other Ambulatory Visit: Payer: Self-pay | Admitting: Primary Care

## 2021-10-01 DIAGNOSIS — H938X3 Other specified disorders of ear, bilateral: Secondary | ICD-10-CM

## 2021-10-05 ENCOUNTER — Other Ambulatory Visit (HOSPITAL_COMMUNITY): Payer: Self-pay

## 2021-10-05 ENCOUNTER — Ambulatory Visit: Payer: Self-pay

## 2021-10-09 ENCOUNTER — Other Ambulatory Visit (HOSPITAL_COMMUNITY): Payer: Self-pay

## 2021-10-10 ENCOUNTER — Other Ambulatory Visit (HOSPITAL_COMMUNITY): Payer: Self-pay

## 2021-10-11 ENCOUNTER — Other Ambulatory Visit (HOSPITAL_COMMUNITY): Payer: Self-pay

## 2021-10-30 IMAGING — CT CT HEAD W/O CM
3 of 4 series · 14 of 47 positions shown, 16 images · non-contrast
Comparison: None

CLINICAL DATA: Tripped and fell striking head against bathroom
vanity, denies loss of consciousness, frontal hematoma and
laceration

EXAM:
CT HEAD WITHOUT CONTRAST
TECHNIQUE: Contiguous axial images were obtained from the base of the skull
through the vertex without intravenous contrast. Sagittal and
coronal MPR images reconstructed from axial data set.

[Series 3: head 2.0 h70h · axial · 0.44mm/px · z∈[-131,-3]mm · 8 of 82 slices shown, 10 images]
[im 9/82  brain]
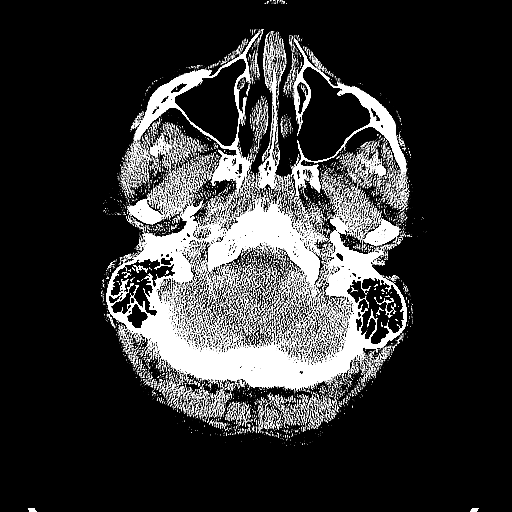
[im 9/82  bone]
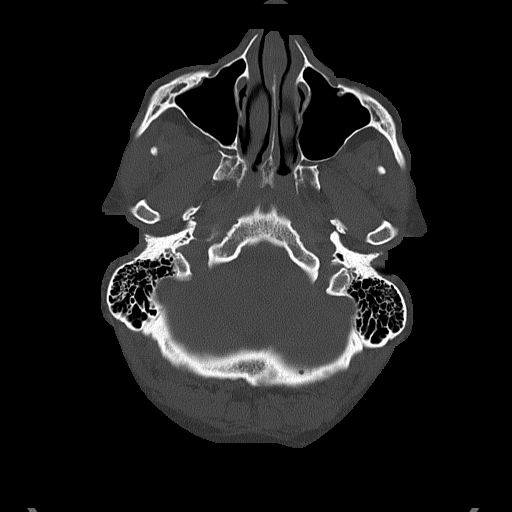
[im 17/82  brain]
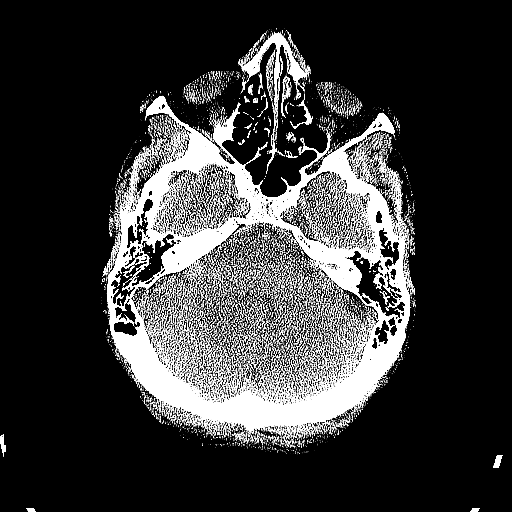
[im 25/82  brain]
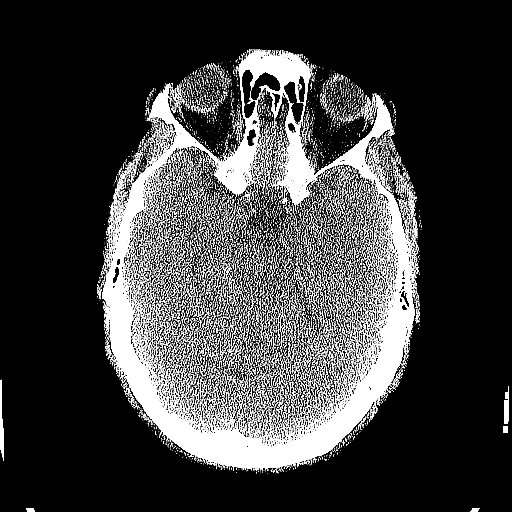
[im 37/82  brain]
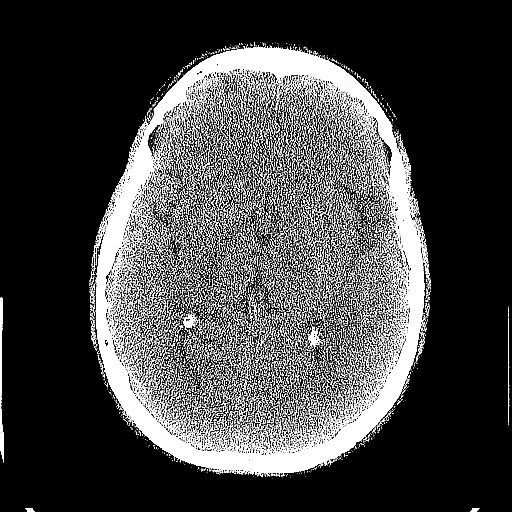
[im 45/82  brain]
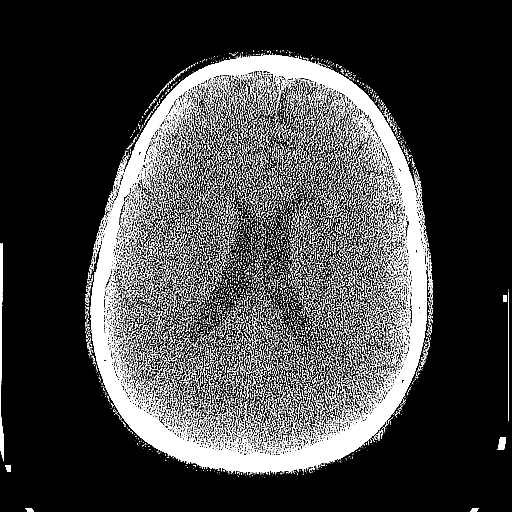
[im 45/82  bone]
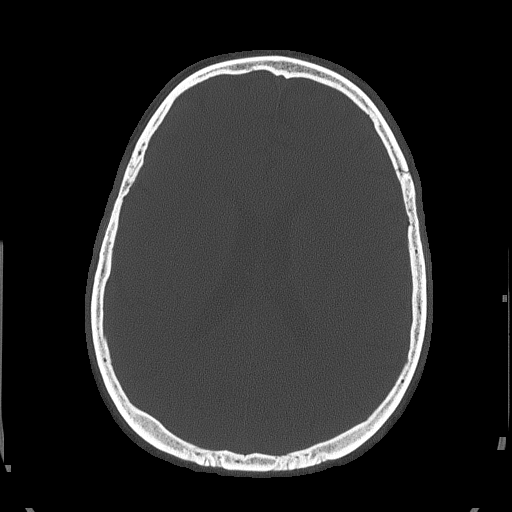
[im 57/82  brain]
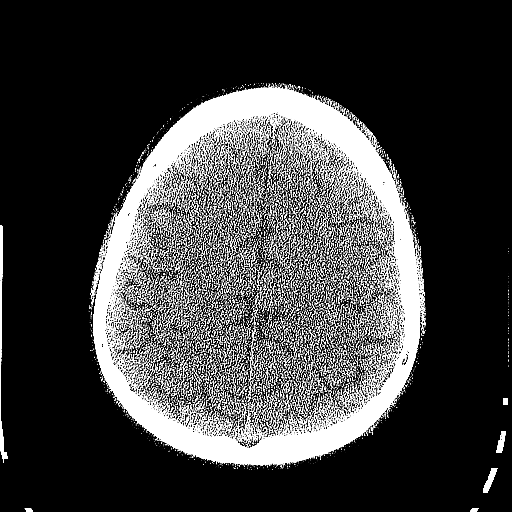
[im 65/82  brain]
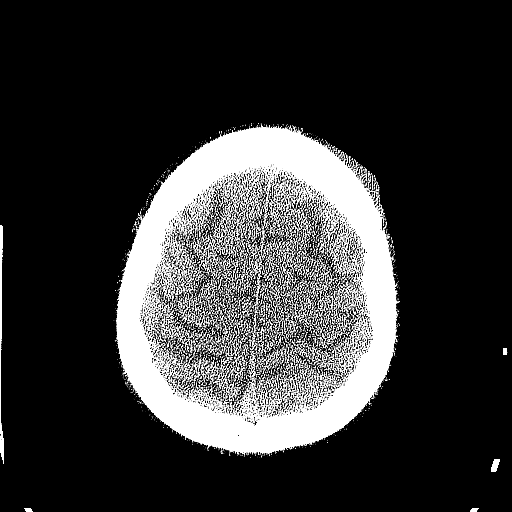
[im 73/82  brain]
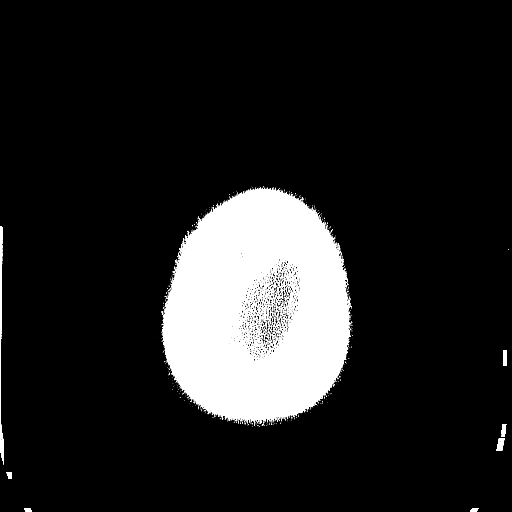

[Series 5: head 3.0 mpr cor · coronal · 0.34mm/px · 3 of 71 slices shown]
[im 24/71  brain]
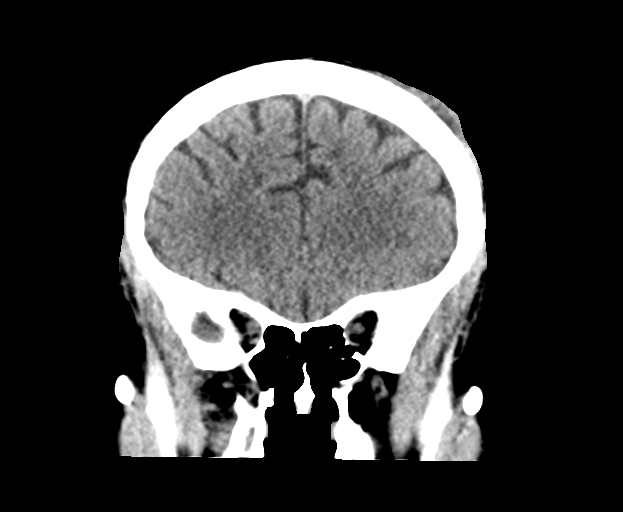
[im 32/71  brain]
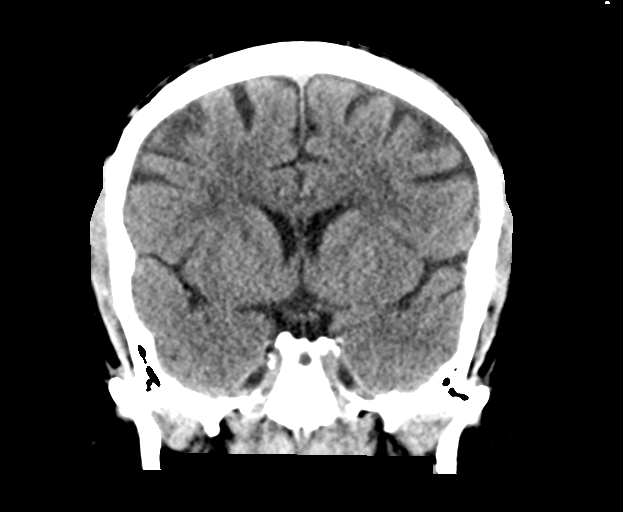
[im 39/71  brain]
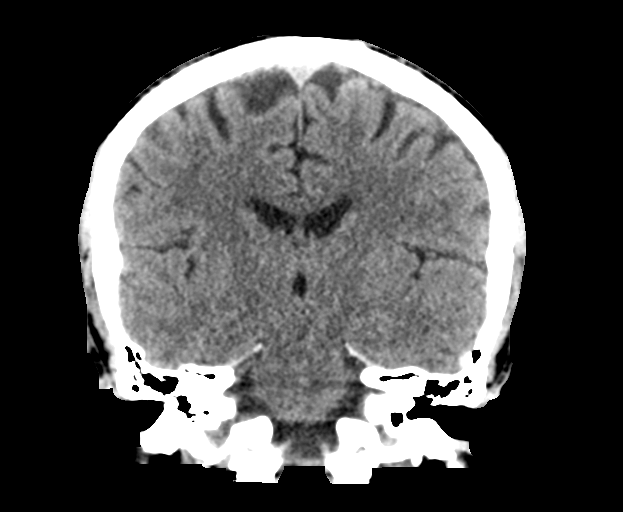

[Series 6: head 3.0 mpr sag · sagittal · 0.34mm/px · 3 of 54 slices shown]
[im 18/54  brain]
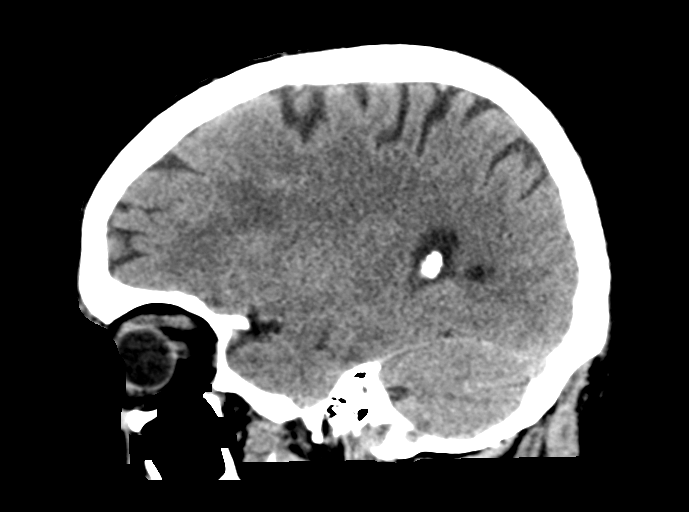
[im 27/54  brain]
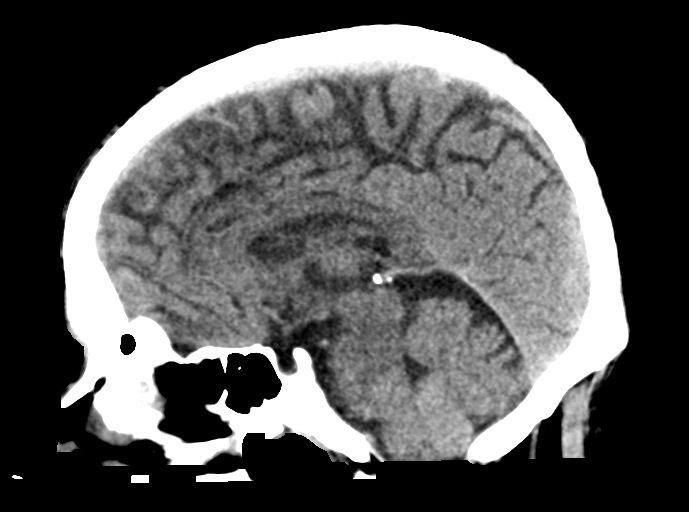
[im 36/54  brain]
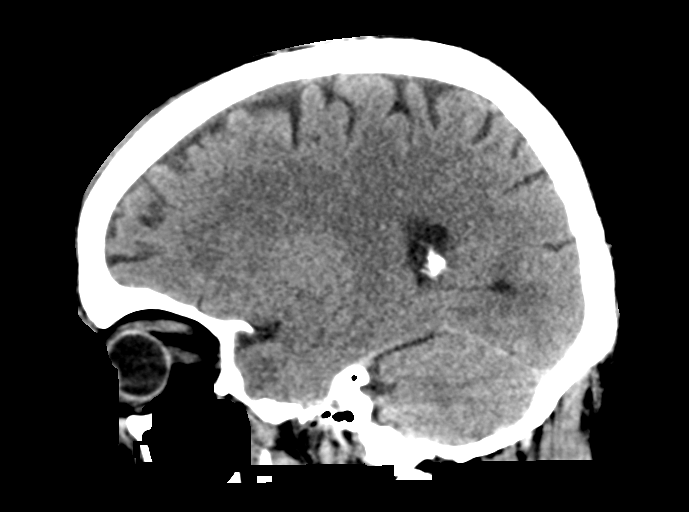

[14 of 47 positions shown; findings below may reference images not displayed]

FINDINGS: Brain: Minimal atrophy. Normal ventricular morphology. No midline
shift or mass effect. Small vessel chronic ischemic changes of deep
cerebral white matter. Small focus of high attenuation extra-axial
at LEFT temporal lobe consistent with minimal subdural hemorrhage,
best visualized on coronal images. No intraparenchymal hemorrhage,
mass lesion or evidence of acute infarction. No extra-axial fluid
collections.

Vascular: No hyperdense vessels

Skull: LEFT scalp hematoma. Nondisplaced LEFT frontal skull
fracture.

Sinuses/Orbits: Paranasal sinuses and mastoid air cells clear

Other: N/A
IMPRESSION: Atrophy with small vessel chronic ischemic changes of deep cerebral
white matter.

Small focus of acute subdural hemorrhage at LEFT temporal lobe.

Nondisplaced LEFT frontal skull fracture.

Critical Value/emergent results were called by telephone at the time
of interpretation on 03/16/2020 at 3999 hours to provider DR. AVUYILE
JHUNGEER, who verbally acknowledged these results.

## 2021-11-06 ENCOUNTER — Other Ambulatory Visit (HOSPITAL_COMMUNITY): Payer: Self-pay

## 2021-11-06 NOTE — Addendum Note (Signed)
Addended by: Truddie Crumble on: 11/06/2021 04:47 PM   Modules accepted: Orders

## 2021-11-07 ENCOUNTER — Other Ambulatory Visit: Payer: No Typology Code available for payment source

## 2021-11-07 ENCOUNTER — Other Ambulatory Visit (HOSPITAL_COMMUNITY)
Admission: RE | Admit: 2021-11-07 | Discharge: 2021-11-07 | Disposition: A | Discharge status: Home / Self Care | Payer: No Typology Code available for payment source | Source: Ambulatory Visit | Attending: Infectious Diseases | Admitting: Infectious Diseases

## 2021-11-07 DIAGNOSIS — Z113 Encounter for screening for infections with a predominantly sexual mode of transmission: Secondary | ICD-10-CM

## 2021-11-07 DIAGNOSIS — Z79899 Other long term (current) drug therapy: Secondary | ICD-10-CM

## 2021-11-07 DIAGNOSIS — B20 Human immunodeficiency virus [HIV] disease: Secondary | ICD-10-CM

## 2021-11-08 ENCOUNTER — Other Ambulatory Visit: Payer: Self-pay

## 2021-11-08 LAB — URINE CYTOLOGY ANCILLARY ONLY
Chlamydia: NEGATIVE
Comment: NEGATIVE
Comment: NORMAL
Neisseria Gonorrhea: NEGATIVE

## 2021-11-08 LAB — T-HELPER CELL (CD4) - (RCID CLINIC ONLY)
CD4 % Helper T Cell: 45 % (ref 33–65)
CD4 T Cell Abs: 1866 /uL — ABNORMAL HIGH (ref 400–1790)

## 2021-11-08 MED ORDER — DAPAGLIFLOZIN PROPANEDIOL 10 MG PO TABS: 10.0000 mg | ORAL_TABLET | Freq: Every day | ORAL | 6 refills | Status: DC

## 2021-11-08 NOTE — Progress Notes (Signed)
Office Visit    Patient Name: Jacob Ali Date of Encounter: 11/08/2021  Primary Care Provider:  Pleas Koch, NP Primary Cardiologist:  [name], MD Primary Electrophysiologist: None  Chief Complaint    Jacob Ali is a 61 y.o. male with PMH of CAD s/p PCI to mid-LAD 01/2021, PAD s/p atherectomy and stenting of left SFA and popliteal vein 2018, GERD, HIV, HLD, HTN, OSA, current smoker, and hx of colon cancer who presents today for 62-monthfollow-up.  Past Medical History    Past Medical History:  Diagnosis Date   CAD in native artery 01/31/2021   Depression    GERD (gastroesophageal reflux disease)    HIV infection ([facility]    Hyperlipidemia    Hypertension    Peripheral vascular disease (HCC)    PONV (postoperative nausea and vomiting)    S/P angioplasty with stent 01/30/21 to LAD  01/31/2021   Sleep apnea    [place]    Tubular adenoma of colon 11/03/2017   Colonoscopy Sept 9, 2019   Past Surgical History:  Procedure Laterality Date   CARDIAC CATHETERIZATION     CORONARY STENT INTERVENTION N/A 01/30/2021   Procedure: CORONARY STENT INTERVENTION;  Surgeon: SBelva Crome MD;  [facility];  Service: Cardiovascular;  Laterality: N/A;   Examination under anesthesia, repair of anal fissure,  12/04/2000   LEFT HEART CATH AND CORONARY ANGIOGRAPHY N/A 01/30/2021   Procedure: LEFT HEART CATH AND CORONARY ANGIOGRAPHY;  Surgeon: SBelva Crome MD;  [facility];  Service: Cardiovascular;  Laterality: N/A;   PERIPHERAL VASCULAR CATHETERIZATION Left 02/06/2016   Procedure: Lower Extremity Angiography;  Surgeon: GKatha Cabal MD;  [facility];  Service: Cardiovascular;  Laterality: Left;   PERIPHERAL VASCULAR CATHETERIZATION N/A 02/06/2016   Procedure: Abdominal Aortogram w/Lower Extremity;  Surgeon: GKatha Cabal MD;  [facility];  Service: Cardiovascular;   Laterality: N/A;   PERIPHERAL VASCULAR CATHETERIZATION  02/06/2016   Procedure: Lower Extremity Intervention;  Surgeon: GKatha Cabal MD;  [facility];  Service: Cardiovascular;;    Allergies  Allergies  Allergen Reactions   Atorvastatin Other (See Comments)    myalgias   Tamiflu [Oseltamivir Phosphate] Other (See Comments)    Depressive symptoms    History of Present Illness    Jacob Ali is a 61year old male with the above mention past medical history who presents today for follow-up of coronary artery disease.  Mr. HBerentwas initially seen 01/2021 by Dr. PJohney Framefor evaluation of chest pain.  Patient reported chest pain that occurred 2 weeks prior that was sharp in the center of his chest and radiating to both shoulders arms and back.  He was also associated with nausea.  Patient smokes daily and uses cocaine once per month.  [facility].  Patient troponins trending 34 --> 30 and underwent left heart cath by Dr. STamala Julianthat revealed 612to 70% eccentric RCA stenosis with widely patent left main and 90% mid LAD stenosis after first septal perforator.  He received a DES x1 with 0% residual following procedure.  2D echo was completed that demonstrated normal LV function with EF of 60 to 65%, no RWMA, grade 1 DD with normal RV systolic function and trivial mitral regurgitation.  He was seen for follow-up 02/2021 and reported 1 episode of chest discomfort due to  emotional stress that was relieved with nitroglycerin.  His blood pressures were noted to be elevated and patient stopped his atorvastatin due to myalgias.    He was last seen by Dr. Johney Frame 04/2021 for follow-up and reported doing well with blood pressures well controlled at goal.  He was dissipating in cardiac rehab and doing well.  He reported mild claudication symptoms and was referred to vascular for further work-up.  He was seen by Dr. Fletcher Anon for consultation.   Patient had ABIs completed in March that showed 0.88 on the right and 0.72 on the left.  He had previous SFA stent placed that showed possible in-stent restenosis.  He was advised to continue medical therapy and walking program.  Jacob Ali presents today for 82-monthfollow-up alone.  Since last being seen in the office patient reports that he has been doing well but has a tremendous amount of life stressors currently.  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  During today's visit he was visibly upset and frustrated.  He reports that he has been compliant with medications and blood pressure today was 148/78 and heart rate of 87 bpm.  Patient denies chest pain, palpitations, dyspnea, PND, orthopnea, nausea, vomiting, dizziness, syncope, edema, weight gain, or early satiety.   Home Medications    Current Outpatient Medications  Medication Sig Dispense Refill   ACCU-CHEK GUIDE test strip USE UP TO 4 TIMES A DAY AS DIRECTED 100 strip 5   Accu-Chek Softclix Lancets lancets USE UP TO 4 TIMES A DAY AS DIRECTED 100 each 5   acetaminophen (TYLENOL) 500 MG tablet Take 1,000 mg by mouth every 6 (six) hours as needed for moderate pain.     aspirin 81 MG chewable tablet Chew 1 tablet (81 mg total) by mouth daily.     bictegravir-emtricitabine-tenofovir AF (BIKTARVY) 50-200-25 MG TABS tablet TAKE 1 TABLET BY MOUTH DAILY. 30 tablet 6   carvedilol (COREG) 6.25 MG tablet TAKE 1 TABLET BY MOUTH 2 TIMES DAILY WITH A MEAL. 180 tablet 3   clopidogrel (PLAVIX) 75 MG tablet TAKE 1 TABLET BY MOUTH EVERY DAY 90 tablet 2   dapagliflozin propanediol (FARXIGA) 10 MG TABS tablet Take 1 tablet (10 mg total) by mouth daily before breakfast. 30 tablet 6   diphenhydramine-acetaminophen (TYLENOL PM) 25-500 MG TABS tablet Take 1 tablet by mouth at bedtime as needed (sleep).     fluticasone (FLONASE) 50 MCG/ACT nasal spray PLACE 1 SPRAY INTO BOTH NOSTRILS 2  (TWO) TIMES DAILY 48 mL 0   lisinopril (ZESTRIL) 20 MG tablet Take 1 tablet (20 mg total) by mouth daily. 90 tablet 3   metFORMIN (GLUCOPHAGE-XR) 500 MG 24 hr tablet Take 1 tablet (500 mg total) by mouth daily with breakfast. for diabetes. 90 tablet 1   nitroGLYCERIN (NITROSTAT) 0.4 MG SL tablet Place 1 tablet (0.4 mg total) under the tongue every 5 (five) minutes x 3 doses as needed for chest pain. 25 tablet 4   pantoprazole (PROTONIX) 40 MG tablet Take 1 tablet (40 mg total) by mouth daily. For heartburn. 90 tablet 3   rosuvastatin (CRESTOR) 40 MG tablet TAKE 1 TABLET BY MOUTH EVERY DAY 90 tablet 3   No current facility-administered medications for this visit.     Review of Systems  Please see the history of present illness.    (+) Chronic shoulder pain (+) Emotional distress  All other systems reviewed and are otherwise  negative except as noted above.  Physical Exam    Wt Readings from Last 3 Encounters:  07/23/21 187 lb (84.8 kg)  07/12/21 189 lb (85.7 kg)  06/05/21 185 lb (83.9 kg)   BM:WUXLK were no vitals filed for this visit.,There is no height or weight on file to calculate BMI.  Constitutional:      Appearance: Healthy appearance. Not in distress.  Neck:     Vascular: JVD normal.  Pulmonary:     Effort: Pulmonary effort is normal.     Breath sounds: No wheezing. No rales. Diminished in the bases Cardiovascular:     Normal rate. Regular rhythm. Normal S1. Normal S2.      Murmurs: There is no murmur.  Edema:    Peripheral edema absent.  Abdominal:     Palpations: Abdomen is soft non tender. There is no hepatomegaly.  Skin:    General: Skin is warm and dry.  Neurological:     General: No focal deficit present.     Mental Status: Alert and oriented to person, place and time.     Cranial Nerves: Cranial nerves are intact.  EKG/LABS/Other Studies Reviewed    ECG personally reviewed by me today -none completed today    Lab Results  Component Value Date   WBC  10.3 11/07/2021   HGB 12.2 (L) 11/07/2021   HCT 36.8 (L) 11/07/2021   MCV 99 (H) 11/07/2021   PLT 260 11/07/2021   Lab Results  Component Value Date   CREATININE 0.90 11/07/2021   BUN 14 11/07/2021   NA 141 11/07/2021   K 4.3 11/07/2021   CL 103 11/07/2021   CO2 22 11/07/2021   Lab Results  Component Value Date   ALT 17 11/07/2021   AST 17 11/07/2021   GGT 44 06/26/2020   ALKPHOS 68 11/07/2021   BILITOT 0.6 11/07/2021   Lab Results  Component Value Date   CHOL 111 11/07/2021   HDL 27 (L) 11/07/2021   LDLCALC 40 11/07/2021   LDLDIRECT 98.0 01/24/2021   TRIG 288 (H) 11/07/2021   CHOLHDL 4.1 11/07/2021    Lab Results  Component Value Date   HGBA1C 5.8 07/12/2021    Assessment & Plan    1.  Coronary artery disease: -s/p PCI to LAD with DES x1 with residual RCA disease that is managed medically. -Currently on DAPT with ASA 81 mg and Plavix 75 mg daily, rosuvastatin 40 mg, and carvedilol 3.125 mg twice daily -Today patient reports that he is no longer having chest pain  2.  Hyperlipidemia: -Patient's LDL was 40 at goal of less than 70 -Continue rosuvastatin 40 mg -We will start fenofibrate 150 mg daily for elevated triglycerides  3.  Essential hypertension: -Patient's blood pressure today was 148/78 -Continue carvedilol and Zestril 20 mg  4.  History of PAD: -Patient recently seen for consultation due to increased claudication during experience during cardiac rehab. -He has a previous history of s/p atherectomy and stenting of left SFA and popliteal vein 2018   Disposition: Follow-up with [name], MD or APP in 6 months    Medication Adjustments/Labs and Tests Ordered: Current medicines are reviewed at length with the patient today.  Concerns regarding medicines are outlined above.   Signed, Mable Fill, Marissa Nestle, NP 11/08/2021, 8:17 PM [facility]  Note:  This document was prepared using Dragon voice recognition  software and may include unintentional dictation errors.

## 2021-11-09 ENCOUNTER — Encounter: Payer: Self-pay | Admitting: Nurse Practitioner

## 2021-11-09 ENCOUNTER — Ambulatory Visit: Payer: No Typology Code available for payment source | Attending: Nurse Practitioner | Admitting: Nurse Practitioner

## 2021-11-09 VITALS — BP 148/78 | HR 87 | Ht 69.0 in | Wt 187.0 lb

## 2021-11-09 DIAGNOSIS — I10 Essential (primary) hypertension: Secondary | ICD-10-CM

## 2021-11-09 DIAGNOSIS — E782 Mixed hyperlipidemia: Secondary | ICD-10-CM

## 2021-11-09 DIAGNOSIS — I739 Peripheral vascular disease, unspecified: Secondary | ICD-10-CM | POA: Diagnosis not present

## 2021-11-09 DIAGNOSIS — I2510 Atherosclerotic heart disease of native coronary artery without angina pectoris: Secondary | ICD-10-CM | POA: Diagnosis not present

## 2021-11-09 LAB — COMPREHENSIVE METABOLIC PANEL
ALT: 17 IU/L (ref 0–44)
AST: 17 IU/L (ref 0–40)
Albumin/Globulin Ratio: 2.3 — ABNORMAL HIGH (ref 1.2–2.2)
Albumin: 4.5 g/dL (ref 3.9–4.9)
Alkaline Phosphatase: 68 IU/L (ref 44–121)
BUN/Creatinine Ratio: 16 (ref 10–24)
BUN: 14 mg/dL (ref 8–27)
Bilirubin Total: 0.6 mg/dL (ref 0.0–1.2)
CO2: 22 mmol/L (ref 20–29)
Calcium: 9.2 mg/dL (ref 8.6–10.2)
Chloride: 103 mmol/L (ref 96–106)
Creatinine, Ser: 0.9 mg/dL (ref 0.76–1.27)
Globulin, Total: 2 g/dL (ref 1.5–4.5)
Glucose: 113 mg/dL — ABNORMAL HIGH (ref 70–99)
Potassium: 4.3 mmol/L (ref 3.5–5.2)
Sodium: 141 mmol/L (ref 134–144)
Total Protein: 6.5 g/dL (ref 6.0–8.5)
eGFR: 97 mL/min/{1.73_m2} (ref >59)

## 2021-11-09 LAB — CBC
Hematocrit: 36.8 % — ABNORMAL LOW (ref 37.5–51.0)
Hemoglobin: 12.2 g/dL — ABNORMAL LOW (ref 13.0–17.7)
MCH: 32.9 pg (ref 26.6–33.0)
MCHC: 33.2 g/dL (ref 31.5–35.7)
MCV: 99 fL — ABNORMAL HIGH (ref 79–97)
Platelets: 260 10*3/uL (ref 150–450)
RBC: 3.71 x10E6/uL — ABNORMAL LOW (ref 4.14–5.80)
RDW: 13.2 % (ref 11.6–15.4)
WBC: 10.3 10*3/uL (ref 3.4–10.8)

## 2021-11-09 LAB — LIPID PANEL
Chol/HDL Ratio: 4.1 ratio (ref 0.0–5.0)
Cholesterol, Total: 111 mg/dL (ref 100–199)
HDL: 27 mg/dL — ABNORMAL LOW (ref >39)
LDL Chol Calc (NIH): 40 mg/dL (ref 0–99)
Triglycerides: 288 mg/dL — ABNORMAL HIGH (ref 0–149)
VLDL Cholesterol Cal: 44 mg/dL — ABNORMAL HIGH (ref 5–40)

## 2021-11-09 LAB — HIV-1 RNA QUANT-NO REFLEX-BLD: HIV-1 RNA Viral Load: <20 copies/mL

## 2021-11-09 LAB — RPR: RPR Ser Ql: NONREACTIVE

## 2021-11-09 MED ORDER — FENOFIBRATE 145 MG PO TABS
145.0000 mg | ORAL_TABLET | Freq: Every day | ORAL | 3 refills | Status: DC
Start: 2021-11-09 — End: 2022-10-07

## 2021-11-09 NOTE — Patient Instructions (Signed)
Medication Instructions:  Your physician has recommended you make the following change in your medication:   Start taking Tricor/fenofibrate '145mg'$  daily *If you need a refill on your cardiac medications before your next appointment, please call your pharmacy*   Lab Work: Lipids and LFTs 8 weeks If you have labs (blood work) drawn today and your tests are completely normal, you will receive your results only by: [place] (if you have MyChart) OR A paper copy in the mail If you have any lab test that is abnormal or we need to change your treatment, we will call you to review the results.   Follow-Up: At [facility], you and your health needs are our priority.  As part of our continuing mission to provide you with exceptional heart care, we have created designated Provider Care Teams.  These Care Teams include your primary Cardiologist (physician) and Advanced Practice Providers (APPs -  Physician Assistants and Nurse Practitioners) who all work together to provide you with the care you need, when you need it.    Your next appointment:   6 month(s)  The format for your next appointment:   In Person  Provider:   [name], MD  or Ambrose Pancoast, NP   Other Instructions   Important Information About Sugar

## 2021-11-12 ENCOUNTER — Encounter (INDEPENDENT_AMBULATORY_CARE_PROVIDER_SITE_OTHER): Payer: Self-pay

## 2021-11-14 ENCOUNTER — Encounter: Payer: Self-pay | Admitting: Infectious Diseases

## 2021-11-14 ENCOUNTER — Ambulatory Visit (INDEPENDENT_AMBULATORY_CARE_PROVIDER_SITE_OTHER): Payer: No Typology Code available for payment source | Admitting: Infectious Diseases

## 2021-11-14 DIAGNOSIS — E119 Type 2 diabetes mellitus without complications: Secondary | ICD-10-CM

## 2021-11-14 DIAGNOSIS — I2510 Atherosclerotic heart disease of native coronary artery without angina pectoris: Secondary | ICD-10-CM

## 2021-11-14 DIAGNOSIS — F17210 Nicotine dependence, cigarettes, uncomplicated: Secondary | ICD-10-CM

## 2021-11-14 DIAGNOSIS — D126 Benign neoplasm of colon, unspecified: Secondary | ICD-10-CM | POA: Diagnosis not present

## 2021-11-14 DIAGNOSIS — Z79899 Other long term (current) drug therapy: Secondary | ICD-10-CM

## 2021-11-14 DIAGNOSIS — Z95820 Peripheral vascular angioplasty status with implants and grafts: Secondary | ICD-10-CM

## 2021-11-14 DIAGNOSIS — F1491 Cocaine use, unspecified, in remission: Secondary | ICD-10-CM

## 2021-11-14 DIAGNOSIS — I70219 Atherosclerosis of native arteries of extremities with intermittent claudication, unspecified extremity: Secondary | ICD-10-CM | POA: Diagnosis not present

## 2021-11-14 DIAGNOSIS — Z7984 Long term (current) use of oral hypoglycemic drugs: Secondary | ICD-10-CM

## 2021-11-14 DIAGNOSIS — B20 Human immunodeficiency virus [HIV] disease: Secondary | ICD-10-CM

## 2021-11-14 DIAGNOSIS — F17200 Nicotine dependence, unspecified, uncomplicated: Secondary | ICD-10-CM

## 2021-11-14 DIAGNOSIS — Z113 Encounter for screening for infections with a predominantly sexual mode of transmission: Secondary | ICD-10-CM

## 2021-11-14 NOTE — Assessment & Plan Note (Signed)
He is doing well Needs COVID vaccine.  Has had flu vax.  No change in his art.  Will see him back in 9 months, labs prior.

## 2021-11-14 NOTE — Assessment & Plan Note (Signed)
A1C is < 6 Appreciate his PCP f/u.  He is working on his diet.  Will f/u with ophtho

## 2021-11-14 NOTE — Progress Notes (Signed)
Subjective:    Patient ID: Jacob Ali, male  DOB: September 08, 1960, 61 y.o.        MRN: [id number]   HPI 61 y.o. M HIV+ September 2012.  Hx of HTN, hyperlipidemia, hospitalization for tylenol overdose and cutting of wrists on 02/20/11.  He underwent atherectomy (01-2016) L SFA, PTCA and stent of L SFA and popliteal arteries; and PTCA and stent of R common iliac.  Duplex ultrasound of the left leg shows moderat restenosis of the stent in the SFA >50%. He states his f/u since have been good.  Prev taking tivicay-descovy. Changed to biktarvy 11-2016. Alexa tells him to take his pills every morning at 7:30am.   On crestor/fibrate for hyperlipidemia (lipitor gave him myalgias).  He was hospitalized in Jan 2023 and underwent stent of LAD. He had CV f/u 11-09-21.    No problems with ART.    His A1C is 5.8 (June-23). Trying to cut down on his sugar.  Is going to make ophtho appt for this year. Prev "no diabetic issues". Has "black spots in the back of my eye... no one seems to know what it is".  Getting a new vascular doctor.  He got flu vax at work [employer]).     1/2 ppd of cigarillos. Worse with stress. Trying to quit. Patches did not help. No cocaine.    Mother is out of SNF/ALF- this year has been marked by CHF, COVID, vertebral fractures. She is back to walking independently now.  His dog is doing well, 21 yo. On special diet now, due to CKD.   HIV 1 RNA Quant  Date Value  11/14/2020 Not Detected Copies/mL  11/03/2019 29 Copies/mL (H)  11/17/2018 <20 NOT DETECTED copies/mL   HIV-1 RNA Viral Load (copies/mL)  Date Value  11/07/2021 <20   CD4 (no units)  Date Value  11/24/2015 1,130  05/17/2015 1,550  09/30/2014 952   CD4 T Cell Abs (/uL)  Date Value  11/07/2021 1,866 (H)  11/14/2020 1,456  11/03/2019 1,530     Health Maintenance  Topic Date Due  . OPHTHALMOLOGY EXAM  Never done  . Diabetic kidney evaluation - Urine ACR  Never done  . COVID-19 Vaccine (6 - Pfizer  risk series) 11/26/2020  . FOOT EXAM  05/30/2021  . INFLUENZA VACCINE  08/14/2021  . HEMOGLOBIN A1C  01/11/2022  . Lung Cancer Screening  01/30/2022  . Diabetic kidney evaluation - GFR measurement  11/08/2022  . COLONOSCOPY (Pts 45-75yr Insurance coverage will need to be confirmed)  09/23/2027  . TETANUS/TDAP  03/17/2030  . Hepatitis C Screening  Completed  . HIV Screening  Completed  . Zoster Vaccines- Shingrix  Completed  . HPV VACCINES  Aged Out      Review of Systems  Constitutional:  Negative for chills, fever and weight loss.  Eyes:  Negative for blurred vision.  Respiratory:  Positive for cough (he attriubutes to ACE-I). Negative for shortness of breath.   Cardiovascular:  Positive for chest pain (occas, at HS, not like his previous episodes. relieved with NTG.).  Gastrointestinal:  Positive for constipation. Negative for diarrhea.  Genitourinary:  Positive for frequency (attributes to farxiga). Negative for dysuria.  Musculoskeletal:  Positive for myalgias.  Neurological:  Negative for sensory change and headaches.    Please see HPI. All other systems reviewed and negative.     Objective:  Physical Exam Vitals reviewed.  Constitutional:      Appearance: Normal appearance. He is obese.  HENT:  Mouth/Throat:     Mouth: Mucous membranes are moist.     Pharynx: No oropharyngeal exudate.  Eyes:     Extraocular Movements: Extraocular movements intact.     Pupils: Pupils are equal, round, and reactive to light.  Cardiovascular:     Rate and Rhythm: Normal rate and regular rhythm.     Pulses:          Dorsalis pedis pulses are 2+ on the right side and 2+ on the left side.  Pulmonary:     Effort: Pulmonary effort is normal.     Breath sounds: Normal breath sounds.  Abdominal:     General: Bowel sounds are normal. There is no distension.     Palpations: Abdomen is soft.     Tenderness: There is no abdominal tenderness.  Musculoskeletal:     Cervical back:  Normal range of motion and neck supple.     Right lower leg: No edema.     Left lower leg: No edema.  Feet:     Right foot:     Protective Sensation:  1 site tested.  1 site sensed.    Skin integrity: Skin integrity normal.     Toenail Condition: Right toenails are normal.     Left foot:     Protective Sensation:  1 site tested.  1 site sensed.    Skin integrity: Skin integrity normal.     Toenail Condition: Left toenails are normal.  Neurological:     General: No focal deficit present.     Mental Status: He is alert.  Psychiatric:        Mood and Affect: Mood normal.           Assessment & Plan:

## 2021-11-14 NOTE — Assessment & Plan Note (Signed)
He appears to be doing well Appreciate CV f/u.  Prn NTG.

## 2021-11-14 NOTE — Assessment & Plan Note (Signed)
He has normal pulses today in LE.  No skin lesions.

## 2021-11-14 NOTE — Assessment & Plan Note (Signed)
Colon is up to date.

## 2021-11-14 NOTE — Assessment & Plan Note (Signed)
See above

## 2021-11-14 NOTE — Assessment & Plan Note (Signed)
Currently clean

## 2021-11-14 NOTE — Assessment & Plan Note (Signed)
Encouraged to quit. 

## 2021-11-15 ENCOUNTER — Other Ambulatory Visit (HOSPITAL_COMMUNITY): Payer: Self-pay

## 2021-11-28 ENCOUNTER — Other Ambulatory Visit: Payer: Self-pay | Admitting: Primary Care

## 2021-11-28 DIAGNOSIS — E119 Type 2 diabetes mellitus without complications: Secondary | ICD-10-CM

## 2021-11-28 DIAGNOSIS — E1165 Type 2 diabetes mellitus with hyperglycemia: Secondary | ICD-10-CM

## 2021-12-07 ENCOUNTER — Telehealth: Payer: Self-pay | Admitting: Registered Nurse

## 2021-12-07 ENCOUNTER — Other Ambulatory Visit (HOSPITAL_COMMUNITY): Payer: Self-pay

## 2021-12-07 ENCOUNTER — Encounter: Payer: Self-pay | Admitting: Registered Nurse

## 2021-12-07 DIAGNOSIS — U071 COVID-19: Secondary | ICD-10-CM

## 2021-12-07 MED ORDER — PROMETHAZINE HCL 6.25 MG/5ML PO SYRP: 6.2500 mg | ORAL_SOLUTION | Freq: Three times a day (TID) | ORAL | 0 refills | Status: DC | PRN

## 2021-12-07 MED ORDER — MOLNUPIRAVIR EUA 200MG CAPSULE: 4.0000 | ORAL_CAPSULE | Freq: Two times a day (BID) | ORAL | 0 refills | Status: AC

## 2021-12-07 MED ORDER — ALBUTEROL SULFATE HFA 108 (90 BASE) MCG/ACT IN AERS: 1.0000 | INHALATION_SPRAY | RESPIRATORY_TRACT | 0 refills | Status: DC | PRN

## 2021-12-07 NOTE — Telephone Encounter (Unsigned)
Patient left message for NP took covid home taste this am positive has sore throat, temp 100 last night despite daily arthritis tylenol and naproxen for shoulder pain, chills and dry cough.  Patient contacted by telephone reported has had dry cough earlier in the week; tickle in throat yesterday at Thanksgiving and after he got home fatigued and short of breath after chasing dog.  Dog has had seizures this week and increased stress at home. Intermittent chest pain relieved with nitroglycerin tab prn this week.   Denied known sick contacts.  Had Thanksgiving dinner with family at nursing home yesterday.  He notified nursing home staff and family members today of positive test.  Medication reconciliation completed and lexicomp utilized to verify if interactions with paxlovid or molnupiravir as patient would like antivirals.     Pt reported positive home test 12/07/2021 Day 0 fatigue, cough, chills, sore throat.  Used honey with brandy today for sore throat and cough and it helped.  Has flonase at home has not tried use and uses navage nasal saline rinse prn.  Patient able to work remote from home  Notified his supervisor Ginnie Smart.     Pt began quarantine after his positive covid test lives alone with dog.  Patient did not develop symptoms of nausea, vomiting, or diarrhea.   5 day quarantine per [facility] recommendations. Day 1 of quarantine was 51 Nov 2023.  Symptoms started earlier in the week and patient did not covid test as he has never had covid during pandemic or after until today and did not recognize symptoms earlier in the week. Patient to contact me if new or worsening symptoms despite plan of care.  Day 5 11/28 estimated RTW 11/29 with mask wear through 12/3.  Patient stated he plans to work remote from home as he has that option.  Did not prescribed bromphed DM due to sudafed caution with his CAD history.  Patient has tried tessalon pearles in the past and not helpful.  Has used inhaler a long time ago  does not have one at home now.  Reviewed inhaler administration technique with patient and electronic Rx sent to his pharmacy of choice #1 RF0 may substitute formulary equivalent.  Patient has flonase at home discussed use after navage fluticasone nasal 60mg 1 spray each nostril BID prn rhinitis.  Discussed albuterol use with patient 1-2 puffs po q4 hours prn #1 RF0.   Discussed honey 1 tablespoon every 4 hours is a natural cough suppressant but caution due to his diabetes.  Avoid dehydration and drink water to keep urine pale yellow clear and voiding every 2-4 hours while awake.  Patient alert and oriented x3, spoke full sentences without difficulty.  Some nasal congestion noted.  No cough or throat clearing during 22 minute telephone call.  Discussed with patient he can email me [email or web address] or my chart message this weekend as clinic closed until 27 Nov when RN KEvlyn Kannerreturns to clinic on Monday [phone].  I will call him again on Sunday 11/26 to follow up and sooner if he contacts me.  Notified patient I wiould call him back if pharmacy did not have [place].  [facility] alternative [facility] [place].   Pt verbalized understanding and agreement with plan of care. No further questions/concerns at this time. Pt reminded to contact clinic with any changes in symptoms or questions/concerns. HR notified patient to work remote/quarantine through Day 5 RTW estimated Day 6 11/29 with strict mask wear through Day 10  12/3 and no eating in employee lunch room.   [facility]icemail left to contact me if molnupiravir not in stock.  Patient contacted via telephone again and notified his pharmacy does not open until 1100 today per store message.  Patient will contact me if medication not in stock.  Patient stated plans to take nap now due to fatigue.  Patient verbalized understanding information and had no further questions at this time.

## 2021-12-09 NOTE — Telephone Encounter (Signed)
Patient reported got his prescriptions before 11 on day they were sent in and started them.  Chest pain resolved after starting antivirals and albuterol.  Has not needed any further nitroglycerin tablets.  He plans to work remote tomorrow. Discussed if symptoms prohibit him from finishing shift to call [place] or email me tomorrow.  He reported he slept mostly the past 48 hours.  Tolerating po intake without difficulty.  Dog's seizures have stopped also.  Patient plans to return onsite Wed 29 Nov discussed mask wear through 3 Dec when around others and no eating in employee lunch room through 3 Dec.  Patient A&Ox3 spoke full sentences without difficulty no audible cough/wheezing or throat clearing.  A little nasal congestion audible.  Patient verbalized understanding information/instructions, agreed with plan of care and had no further questions at this time.

## 2021-12-11 ENCOUNTER — Ambulatory Visit: Payer: No Typology Code available for payment source | Admitting: Occupational Medicine

## 2021-12-11 DIAGNOSIS — U071 COVID-19: Secondary | ICD-10-CM

## 2021-12-11 NOTE — Progress Notes (Signed)
Phone follow up call prior to coming back to work. Patient Fever free. No NVD. Pt feeling much better. Educated if any further issues to come to the clinic.

## 2021-12-18 ENCOUNTER — Other Ambulatory Visit (HOSPITAL_COMMUNITY): Payer: Self-pay

## 2021-12-21 NOTE — Telephone Encounter (Signed)
Patient seen in warehouse stated feeling well denied concerns walking dog.  Dog doing better and that is helping to decrease his stress also.  Respirations even and unlabored spoke full sentences without difficulty no audible cough/congestion/throat clearing.  Skin warm dry and pink.  Gait sure and steady.  Patient A&Ox3  Encounter closed as Day 10 16 Dec 2021.

## 2022-01-02 ENCOUNTER — Ambulatory Visit: Payer: No Typology Code available for payment source | Attending: Nurse Practitioner

## 2022-01-02 DIAGNOSIS — I2510 Atherosclerotic heart disease of native coronary artery without angina pectoris: Secondary | ICD-10-CM

## 2022-01-02 DIAGNOSIS — E782 Mixed hyperlipidemia: Secondary | ICD-10-CM

## 2022-01-02 LAB — HEPATIC FUNCTION PANEL
ALT: 10 IU/L (ref 0–44)
AST: 13 IU/L (ref 0–40)
Albumin: 4.6 g/dL (ref 3.9–4.9)
Alkaline Phosphatase: 46 IU/L (ref 44–121)
Bilirubin Total: 0.3 mg/dL (ref 0.0–1.2)
Bilirubin, Direct: 0.13 mg/dL (ref 0.00–0.40)
Total Protein: 6.5 g/dL (ref 6.0–8.5)

## 2022-01-02 LAB — LIPID PANEL
Chol/HDL Ratio: 3.7 ratio (ref 0.0–5.0)
Cholesterol, Total: 93 mg/dL — ABNORMAL LOW (ref 100–199)
HDL: 25 mg/dL — ABNORMAL LOW (ref >39)
LDL Chol Calc (NIH): 44 mg/dL (ref 0–99)
Triglycerides: 132 mg/dL (ref 0–149)
VLDL Cholesterol Cal: 24 mg/dL (ref 5–40)

## 2022-01-03 ENCOUNTER — Other Ambulatory Visit: Payer: No Typology Code available for payment source

## 2022-01-04 ENCOUNTER — Ambulatory Visit: Payer: No Typology Code available for payment source | Admitting: Primary Care

## 2022-01-08 ENCOUNTER — Ambulatory Visit: Payer: No Typology Code available for payment source | Admitting: Primary Care

## 2022-01-08 ENCOUNTER — Other Ambulatory Visit (INDEPENDENT_AMBULATORY_CARE_PROVIDER_SITE_OTHER): Payer: Self-pay | Admitting: Nurse Practitioner

## 2022-01-08 ENCOUNTER — Encounter: Payer: Self-pay | Admitting: Primary Care

## 2022-01-08 VITALS — BP 136/60 | HR 84 | Temp 97.3°F | Ht 69.0 in | Wt 189.0 lb

## 2022-01-08 DIAGNOSIS — K921 Melena: Secondary | ICD-10-CM | POA: Insufficient documentation

## 2022-01-08 LAB — HEMOCCULT GUIAC POC 1CARD (OFFICE): Fecal Occult Blood, POC: NEGATIVE

## 2022-01-08 NOTE — Progress Notes (Signed)
Subjective:    Patient ID: Jacob Ali, male    DOB: 10-16-1960, 61 y.o.   MRN: [id number]  HPI  Jacob Ali is a very pleasant 61 y.o. male with a history of hypertension, CAD, GERD, rectal bleeding, tubular adenoma, subdural hematoma, HIV, who presents today to discuss anemia.  He sent [place] a message via [place] on 01/02/22 with reports of black stools. He also reported consistent Naproxen use. Given his message it was advised that he come in for evaluation.   He underwent lab work in October 2023 per ID provider. CBC with mild anemia, elevated MCV.   Today he mentions taking Pepto Bismol intermittently since late November/early December 2023. He first noticed black stools while taking Pepto Bismol for Covid-19 infection.  He's not had a black stools in 2 weeks. Three days ago he began taking ferrous sulfate 325 mg daily, has noticed some darkness return to his stools. He denies rectal bleeding, palpitations. He does have constipation but is working to treat with increased fiber intake.    Review of Systems  Respiratory:  Negative for shortness of breath.   Cardiovascular:  Negative for palpitations.  Gastrointestinal:  Positive for constipation. Negative for abdominal pain, diarrhea and rectal pain.       Dark stools         Past Medical History:  Diagnosis Date   CAD in native artery 01/31/2021   Depression    GERD (gastroesophageal reflux disease)    HIV infection (HCC)    Hyperlipidemia    Hypertension    Peripheral vascular disease (HCC)    PONV (postoperative nausea and vomiting)    S/P angioplasty with stent 01/30/21 to LAD  01/31/2021   Sleep apnea    Subdural hemorrhage ([place])    Tubular adenoma of colon 11/03/2017   Colonoscopy Sept 9, 2019    Social History   Socioeconomic History   Marital status: Single    Spouse name: Not on file   Number of children: Not on file   Years of education: 12   Highest education level: High school graduate   Occupational History   Occupation: Recruitment consultant: [employer]  Tobacco Use   Smoking status: Every Day    Packs/day: 0.50    Years: 40.00    Total pack years: 20.00    Types: Cigars, Cigarettes   Smokeless tobacco: Never   Tobacco comments:    1/2PPD Mr Siedschlag was wearing a nicotine patch today and says he plans to quit smoking. Given the phone number to 1-800=quit-now   Vaping Use   Vaping Use: Former  Substance and Sexual Activity   Alcohol use: Yes    Alcohol/week: 2.0 standard drinks of alcohol    Types: 2 Standard drinks or equivalent per week    Comment: 1-2 times a week   Drug use: Not Currently    Types: Marijuana    Comment: 2 per month   Sexual activity: Not Currently    Partners: Male    Birth control/protection: Condom    Comment: pt. given condoms  Other Topics Concern   Not on file  Social History Narrative   Single, lives alone.     Social Determinants of Health   Financial Resource Strain: Not on file  Food Insecurity: Not on file  Transportation Needs: Not on file  Physical Activity: Not on file  Stress: Not on file  Social Connections: Not on file  Intimate Partner Violence: Not  on file    Past Surgical History:  Procedure Laterality Date   CARDIAC CATHETERIZATION     CORONARY STENT INTERVENTION N/A 01/30/2021   Procedure: CORONARY STENT INTERVENTION;  Surgeon: Belva Crome, MD;  Location: [facility];  Service: Cardiovascular;  Laterality: N/A;   Examination under anesthesia, repair of anal fissure,  12/04/2000   LEFT HEART CATH AND CORONARY ANGIOGRAPHY N/A 01/30/2021   Procedure: LEFT HEART CATH AND CORONARY ANGIOGRAPHY;  Surgeon: Belva Crome, MD;  Location: [place] [facility];  Service: Cardiovascular;  Laterality: N/A;   PERIPHERAL VASCULAR CATHETERIZATION Left 02/06/2016   Procedure: Lower Extremity Angiography;  Surgeon: Katha Cabal, MD;  Location: [place] [facility];  Service: Cardiovascular;   Laterality: Left;   PERIPHERAL VASCULAR CATHETERIZATION N/A 02/06/2016   Procedure: Abdominal Aortogram w/Lower Extremity;  Surgeon: Katha Cabal, MD;  Location: [facility];  Service: Cardiovascular;  Laterality: N/A;   PERIPHERAL VASCULAR CATHETERIZATION  02/06/2016   Procedure: Lower Extremity Intervention;  Surgeon: Katha Cabal, MD;  Location: [place];  Service: Cardiovascular;;    Family History  Problem Relation Age of Onset   Hypertension Mother    Asthma Mother    Throat cancer Father    Heart disease Father    Cancer Father        bladder   Cancer Maternal Grandmother        colon    Allergies  Allergen Reactions   Atorvastatin Other (See Comments)    myalgias   Tamiflu [Oseltamivir Phosphate] Other (See Comments)    Depressive symptoms    Current Outpatient Medications on File Prior to Visit  Medication Sig Dispense Refill   ACCU-CHEK GUIDE test strip USE UP TO 4 TIMES A DAY AS DIRECTED 300 strip 5   Accu-Chek Softclix Lancets lancets USE UP TO 4 TIMES A DAY AS DIRECTED 100 each 5   aspirin 81 MG chewable tablet Chew 1 tablet (81 mg total) by mouth daily.     bictegravir-emtricitabine-tenofovir AF (BIKTARVY) 50-200-25 MG TABS tablet TAKE 1 TABLET BY MOUTH DAILY. 30 tablet 6   carvedilol (COREG) 6.25 MG tablet TAKE 1 TABLET BY MOUTH 2 TIMES DAILY WITH A MEAL. 180 tablet 3   clopidogrel (PLAVIX) 75 MG tablet TAKE 1 TABLET BY MOUTH EVERY DAY 90 tablet 2   dapagliflozin propanediol (FARXIGA) 10 MG TABS tablet Take 1 tablet (10 mg total) by mouth daily before breakfast. 30 tablet 6   fenofibrate (TRICOR) 145 MG tablet Take 1 tablet (145 mg total) by mouth daily. 90 tablet 3   fluticasone (FLONASE) 50 MCG/ACT nasal spray PLACE 1 SPRAY INTO BOTH NOSTRILS 2 (TWO) TIMES DAILY 48 mL 0   lisinopril (ZESTRIL) 20 MG tablet Take 1 tablet (20 mg total) by mouth daily. 90 tablet 3   metFORMIN (GLUCOPHAGE-XR) 500 MG 24 hr tablet TAKE 1 TABLET (500 MG  TOTAL) BY MOUTH DAILY WITH BREAKFAST. FOR DIABETES. 90 tablet 0   nitroGLYCERIN (NITROSTAT) 0.4 MG SL tablet Place 1 tablet (0.4 mg total) under the tongue every 5 (five) minutes x 3 doses as needed for chest pain. 25 tablet 4   pantoprazole (PROTONIX) 40 MG tablet Take 1 tablet (40 mg total) by mouth daily. For heartburn. 90 tablet 3   rosuvastatin (CRESTOR) 40 MG tablet TAKE 1 TABLET BY MOUTH EVERY DAY 90 tablet 3   No current facility-administered medications on file prior to visit.    BP 136/60 (BP Location: Left  Arm, Patient Position: Sitting, Cuff Size: Normal)   Pulse 84   Temp (!) 97.3 F (36.3 C) (Temporal)   Ht '5\' 9"'$  (1.753 m)   Wt 189 lb (85.7 kg)   SpO2 94%   BMI 27.91 kg/m  Objective:   Physical Exam Exam conducted with a chaperone present.  Cardiovascular:     Rate and Rhythm: Normal rate.  Pulmonary:     Effort: Pulmonary effort is normal.  Abdominal:     Palpations: Abdomen is soft.     Tenderness: There is no abdominal tenderness.  Genitourinary:    Rectum: Guaiac result negative. No mass, tenderness, anal fissure, external hemorrhoid or internal hemorrhoid. Normal anal tone.  Neurological:     Mental Status: He is alert.           Assessment & Plan:   Problem List Items Addressed This Visit       Other   Black stools - Primary    Likely secondary to Pepto Bismol use and now oral iron use.  Hemoccult stool card negative today. Checking CBC and iron studies.   He appears stable today. Await results.       Relevant Orders   IBC + Ferritin   CBC   POCT occult blood stool (Completed)       Pleas Koch, NP

## 2022-01-08 NOTE — Assessment & Plan Note (Signed)
Likely secondary to Pepto Bismol use and now oral iron use.  Hemoccult stool card negative today. Checking CBC and iron studies.   He appears stable today. Await results.

## 2022-01-09 ENCOUNTER — Other Ambulatory Visit (INDEPENDENT_AMBULATORY_CARE_PROVIDER_SITE_OTHER): Payer: No Typology Code available for payment source

## 2022-01-09 DIAGNOSIS — K219 Gastro-esophageal reflux disease without esophagitis: Secondary | ICD-10-CM

## 2022-01-09 DIAGNOSIS — R718 Other abnormality of red blood cells: Secondary | ICD-10-CM | POA: Diagnosis not present

## 2022-01-09 LAB — CBC
HCT: 39.1 % (ref 39.0–52.0)
Hemoglobin: 13.2 g/dL (ref 13.0–17.0)
MCHC: 33.7 g/dL (ref 30.0–36.0)
MCV: 101.6 fl — ABNORMAL HIGH (ref 78.0–100.0)
Platelets: 251 10*3/uL (ref 150.0–400.0)
RBC: 3.85 Mil/uL — ABNORMAL LOW (ref 4.22–5.81)
RDW: 14.1 % (ref 11.5–15.5)
WBC: 10.5 10*3/uL (ref 4.0–10.5)

## 2022-01-09 LAB — B12 AND FOLATE PANEL
Folate: 22.6 ng/mL (ref >5.9)
Vitamin B-12: 209 pg/mL — ABNORMAL LOW (ref 211–911)

## 2022-01-09 LAB — IBC + FERRITIN
Ferritin: 87.1 ng/mL (ref 22.0–322.0)
Iron: 76 ug/dL (ref 42–165)
Saturation Ratios: 15.9 % — ABNORMAL LOW (ref 20.0–50.0)
TIBC: 478.8 ug/dL — ABNORMAL HIGH (ref 250.0–450.0)
Transferrin: 342 mg/dL (ref 212.0–360.0)

## 2022-01-10 ENCOUNTER — Other Ambulatory Visit: Payer: Self-pay | Admitting: Primary Care

## 2022-01-10 ENCOUNTER — Other Ambulatory Visit: Payer: Self-pay

## 2022-01-10 DIAGNOSIS — E1165 Type 2 diabetes mellitus with hyperglycemia: Secondary | ICD-10-CM

## 2022-01-10 NOTE — Telephone Encounter (Signed)
From: Ardis Hughs To: Office of Pleas Koch, NP Sent: 01/10/2022 9:20 AM EST Subject: Medication Renewal Request  Refills have been requested for the following medications:   metFORMIN (GLUCOPHAGE-XR) 500 MG 24 hr tablet [Vernal Rutan K Emmely Bittinger]  [id number] [place] [place] - [address] method: [employer]

## 2022-01-11 ENCOUNTER — Other Ambulatory Visit: Payer: Self-pay

## 2022-01-11 ENCOUNTER — Telehealth: Payer: Self-pay

## 2022-01-11 ENCOUNTER — Ambulatory Visit: Payer: No Typology Code available for payment source | Admitting: Primary Care

## 2022-01-11 ENCOUNTER — Encounter: Payer: No Typology Code available for payment source | Admitting: Primary Care

## 2022-01-11 ENCOUNTER — Other Ambulatory Visit (INDEPENDENT_AMBULATORY_CARE_PROVIDER_SITE_OTHER): Payer: No Typology Code available for payment source | Admitting: *Deleted

## 2022-01-11 DIAGNOSIS — K921 Melena: Secondary | ICD-10-CM

## 2022-01-11 LAB — FECAL OCCULT BLOOD, IMMUNOCHEMICAL: Fecal Occult Bld: POSITIVE — AB

## 2022-01-11 NOTE — Telephone Encounter (Signed)
Noted, see result note  

## 2022-01-11 NOTE — Telephone Encounter (Signed)
CRITICAL VALUE STICKER  CRITICAL VALUE: Pos I fob   RECEIVER (on-site recipient of call): Franchelle Foskett   DATE & TIME NOTIFIED: 1:59pm 01/11/2022  MESSENGER (representative from lab): Margarita Grizzle   MD NOTIFIED:  Message sent and red book placed for Provider to see   TIME OF NOTIFICATION: 2:09  RESPONSE:   will await response to phone note

## 2022-01-16 MED ORDER — PANTOPRAZOLE SODIUM 40 MG PO TBEC: 40.0000 mg | DELAYED_RELEASE_TABLET | Freq: Every day | ORAL | 0 refills | Status: DC

## 2022-01-22 ENCOUNTER — Ambulatory Visit
Payer: No Typology Code available for payment source | Attending: Cardiovascular Disease | Admitting: Cardiovascular Disease

## 2022-01-22 ENCOUNTER — Encounter: Payer: Self-pay | Admitting: Cardiovascular Disease

## 2022-01-22 VITALS — BP 134/62 | HR 71 | Ht 69.0 in | Wt 183.1 lb

## 2022-01-22 DIAGNOSIS — E785 Hyperlipidemia, unspecified: Secondary | ICD-10-CM | POA: Diagnosis not present

## 2022-01-22 DIAGNOSIS — I739 Peripheral vascular disease, unspecified: Secondary | ICD-10-CM | POA: Diagnosis not present

## 2022-01-22 DIAGNOSIS — I2510 Atherosclerotic heart disease of native coronary artery without angina pectoris: Secondary | ICD-10-CM | POA: Diagnosis not present

## 2022-01-22 DIAGNOSIS — Z720 Tobacco use: Secondary | ICD-10-CM

## 2022-01-22 DIAGNOSIS — I10 Essential (primary) hypertension: Secondary | ICD-10-CM

## 2022-01-22 NOTE — Progress Notes (Signed)
Cardiology Office Note   Date:  01/22/2022   ID:  Jacob Ali, DOB Jul 27, 1960, MRN [id number]  PCP:  Pleas Koch, NP  Cardiologist: Dr. Johney Frame  No chief complaint on file.     History of Present Illness: Jacob Ali is a 62 y.o. male who is here today for follow-up visit regarding peripheral arterial disease.  He has known history of coronary artery disease status post PCI to the mid LAD in 2003, GERD, HIV, hyperlipidemia, hypertension, obstructive sleep apnea, tobacco use, colon cancer and peripheral arterial disease. He was hospitalized in January of 2023 with non-ST elevation myocardial infarction.  Cardiac catheterization showed severe mid LAD stenosis that was treated with PCI and drug-eluting stent placement.  He was referred to cardiac rehab for he reported mild claudication. He used to be followed by Dr. Delana Meyer.  He had claudication and underwent angioplasty and stent placement to the left SFA and popliteal arteries as well as stent placement to the right common iliac artery in 2018.  Most recent Doppler studies in March of 2023 showed an ABI of 0.88 on the right and 0.72 on the left.  There was evidence of significant in-stent restenosis in the left mid SFA stent with peak velocity of 382. I instructed him to start a walking exercise program which she did.  His claudication gradually improved and he is currently with no symptoms.  He frequently walks his dog with no issues.  Unfortunately, he continues to smoke half a pack per day. He had issues with iron deficiency recently and thus aspirin was stopped.  He continues to take Plavix 75 mg once daily.  He works at [employer].  Past Medical History:  Diagnosis Date   CAD in native artery 01/31/2021   Depression    GERD (gastroesophageal reflux disease)    HIV infection (HCC)    Hyperlipidemia    Hypertension    Peripheral vascular disease (HCC)    PONV (postoperative nausea and vomiting)    S/P  angioplasty with stent 01/30/21 to LAD  01/31/2021   Sleep apnea    Subdural hemorrhage ([place])    Tubular adenoma of colon 11/03/2017   Colonoscopy Sept 9, 2019    Past Surgical History:  Procedure Laterality Date   CARDIAC CATHETERIZATION     CORONARY STENT INTERVENTION N/A 01/30/2021   Procedure: CORONARY STENT INTERVENTION;  Surgeon: Belva Crome, MD;  Location: [place] [facility];  Service: Cardiovascular;  Laterality: N/A;   Examination under anesthesia, repair of anal fissure,  12/04/2000   LEFT HEART CATH AND CORONARY ANGIOGRAPHY N/A 01/30/2021   Procedure: LEFT HEART CATH AND CORONARY ANGIOGRAPHY;  Surgeon: Belva Crome, MD;  Location: [place] [facility];  Service: Cardiovascular;  Laterality: N/A;   PERIPHERAL VASCULAR CATHETERIZATION Left 02/06/2016   Procedure: Lower Extremity Angiography;  Surgeon: Katha Cabal, MD;  Location: [place] [facility];  Service: Cardiovascular;  Laterality: Left;   PERIPHERAL VASCULAR CATHETERIZATION N/A 02/06/2016   Procedure: Abdominal Aortogram w/Lower Extremity;  Surgeon: Katha Cabal, MD;  Location: [place] [facility];  Service: Cardiovascular;  Laterality: N/A;   PERIPHERAL VASCULAR CATHETERIZATION  02/06/2016   Procedure: Lower Extremity Intervention;  Surgeon: Katha Cabal, MD;  Location: [place] [facility];  Service: Cardiovascular;;     Current Outpatient Medications  Medication Sig Dispense Refill   ACCU-CHEK GUIDE test strip USE UP TO 4 TIMES A DAY AS DIRECTED 300 strip 5   Accu-Chek Softclix  Lancets lancets USE UP TO 4 TIMES A DAY AS DIRECTED 100 each 5   bictegravir-emtricitabine-tenofovir AF (BIKTARVY) 50-200-25 MG TABS tablet TAKE 1 TABLET BY MOUTH DAILY. 30 tablet 6   carvedilol (COREG) 6.25 MG tablet TAKE 1 TABLET BY MOUTH 2 TIMES DAILY WITH A MEAL. 180 tablet 3   clopidogrel (PLAVIX) 75 MG tablet TAKE 1 TABLET BY MOUTH EVERY DAY 90 tablet 2   dapagliflozin propanediol (FARXIGA) 10 MG TABS tablet Take 1  tablet (10 mg total) by mouth daily before breakfast. 30 tablet 6   fenofibrate (TRICOR) 145 MG tablet Take 1 tablet (145 mg total) by mouth daily. 90 tablet 3   fluticasone (FLONASE) 50 MCG/ACT nasal spray PLACE 1 SPRAY INTO BOTH NOSTRILS 2 (TWO) TIMES DAILY 48 mL 0   lisinopril (ZESTRIL) 20 MG tablet Take 1 tablet (20 mg total) by mouth daily. 90 tablet 3   metFORMIN (GLUCOPHAGE-XR) 500 MG 24 hr tablet TAKE 1 TABLET (500 MG TOTAL) BY MOUTH DAILY WITH BREAKFAST. FOR DIABETES. 90 tablet 0   nitroGLYCERIN (NITROSTAT) 0.4 MG SL tablet Place 1 tablet (0.4 mg total) under the tongue every 5 (five) minutes x 3 doses as needed for chest pain. 25 tablet 4   pantoprazole (PROTONIX) 40 MG tablet Take 1 tablet (40 mg total) by mouth daily. For heartburn. 90 tablet 0   rosuvastatin (CRESTOR) 40 MG tablet TAKE 1 TABLET BY MOUTH EVERY DAY 90 tablet 3   No current facility-administered medications for this visit.    Allergies:   Atorvastatin and Tamiflu [oseltamivir phosphate]    Social History:  The patient  reports that he has been smoking cigars and cigarettes. He has a 20.00 pack-year smoking history. He has never used smokeless tobacco. He reports current alcohol use of about 2.0 standard drinks of alcohol per week. He reports that he does not currently use drugs after having used the following drugs: Marijuana.   Family History:  The patient's family history includes Asthma in his mother; Cancer in his father and maternal grandmother; Heart disease in his father; Hypertension in his mother; Throat cancer in his father.    ROS:  Please see the history of present illness.   Otherwise, review of systems are positive for none.   All other systems are reviewed and negative.    PHYSICAL EXAM: VS:  BP 134/62   Pulse 71   Ht '5\' 9"'$  (1.753 m)   Wt 183 lb 1.6 oz (83.1 kg)   SpO2 98%   BMI 27.04 kg/m  , BMI Body mass index is 27.04 kg/m. GEN: Well nourished, well developed, in no acute distress  HEENT:  normal  Neck: no JVD, carotid bruits, or masses Cardiac: RRR; no murmurs, rubs, or gallops,no edema  Respiratory:  clear to auscultation bilaterally, normal work of breathing GI: soft, nontender, nondistended, + BS MS: no deformity or atrophy  Skin: warm and dry, no rash Neuro:  Strength and sensation are intact Psych: euthymic mood, full affect    EKG:  EKG is ordered today. EKG showed normal sinus rhythm with nonspecific inferolateral T wave changes.  Recent Labs: 11/07/2021: BUN 14; Creatinine, Ser 0.90; Potassium 4.3; Sodium 141 01/02/2022: ALT 10 01/08/2022: Hemoglobin 13.2; Platelets 251.0    Lipid Panel    Component Value Date/Time   CHOL 93 (L) 01/02/2022 0726   TRIG 132 01/02/2022 0726   HDL 25 (L) 01/02/2022 0726   CHOLHDL 3.7 01/02/2022 0726   CHOLHDL 6 01/24/2021 0954   VLDL 57.6 (  H) 01/24/2021 0954   LDLCALC 44 01/02/2022 0726   LDLCALC  11/14/2020 1522     Comment:     . LDL cholesterol not calculated. Triglyceride levels greater than 400 mg/dL invalidate calculated LDL results. . Reference range: <100 . Desirable range <100 mg/dL for primary prevention;   <70 mg/dL for patients with CHD or diabetic patients  with > or = 2 CHD risk factors. Marland Kitchen LDL-C is now calculated using the Martin-Hopkins  calculation, which is a validated novel method providing  better accuracy than the Friedewald equation in the  estimation of LDL-C.  Cresenciano Genre et al. Annamaria Helling. 0488;891(69): 2061-2068  (http://education.QuestDiagnostics.com/faq/FAQ164)    LDLDIRECT 98.0 01/24/2021 0954      Wt Readings from Last 3 Encounters:  01/22/22 183 lb 1.6 oz (83.1 kg)  01/08/22 189 lb (85.7 kg)  11/14/21 187 lb 9.6 oz (85.1 kg)           No data to display            ASSESSMENT AND PLAN:  1.  Peripheral arterial disease: There is evidence of significant in-stent restenosis in the distal SFA/popliteal arteries.  However, the patient's symptoms improved significantly with a  walking program and currently has no claudication or critical limb ischemia.  Recommend continuing medical therapy.  Follow-up with me on a yearly basis or earlier if symptoms worsen.  I discussed the importance of controlling risk factors.  It is reasonable to continue clopidogrel without aspirin long-term.    2.  Coronary artery disease involving native coronary arteries: He is doing well with no anginal symptoms.  Aspirin was stopped due to recent iron deficiency and heme positive stool.  Continue clopidogrel.  3.  Tobacco use: He has not been able to quit smoking and I discussed the importance of smoking cessation.  4.  Essential hypertension: Blood pressure is reasonably controlled on current medications.  5.  Hyperlipidemia: Continue treatment with rosuvastatin.  I reviewed most recent lipid profile which showed an LDL of 57 which is at target.    Disposition:   FU with me in 12 months  Signed,  Kathlyn Sacramento, MD  01/22/2022 8:54 AM    [place]

## 2022-01-22 NOTE — Patient Instructions (Signed)
Medication Instructions:  No changes *If you need a refill on your cardiac medications before your next appointment, please call your pharmacy*   Lab Work: None ordered If you have labs (blood work) drawn today and your tests are completely normal, you will receive your results only by: [place] (if you have MyChart) OR A paper copy in the mail If you have any lab test that is abnormal or we need to change your treatment, we will call you to review the results.   Testing/Procedures: None ordered   Follow-Up: At [facility], you and your health needs are our priority.  As part of our continuing mission to provide you with exceptional heart care, we have created designated Provider Care Teams.  These Care Teams include your primary Cardiologist (physician) and Advanced Practice Providers (APPs -  Physician Assistants and Nurse Practitioners) who all work together to provide you with the care you need, when you need it.  We recommend signing up for the patient portal called "MyChart".  Sign up information is provided on this After Visit Summary.  MyChart is used to connect with patients for Virtual Visits (Telemedicine).  Patients are able to view lab/test results, encounter notes, upcoming appointments, etc.  Non-urgent messages can be sent to your provider as well.   To learn more about what you can do with MyChart, go to [email or web address].    Your next appointment:   12 month(s)  The format for your next appointment:   In Person  Provider:   Dr. Fletcher Anon

## 2022-02-05 ENCOUNTER — Other Ambulatory Visit (HOSPITAL_COMMUNITY): Payer: Self-pay

## 2022-02-06 ENCOUNTER — Encounter: Payer: No Typology Code available for payment source | Admitting: Primary Care

## 2022-02-07 ENCOUNTER — Other Ambulatory Visit (HOSPITAL_COMMUNITY): Payer: Self-pay

## 2022-02-08 ENCOUNTER — Other Ambulatory Visit: Payer: Self-pay

## 2022-02-13 ENCOUNTER — Ambulatory Visit: Payer: Self-pay | Admitting: Occupational Medicine

## 2022-02-13 VITALS — BP 148/78 | HR 77

## 2022-02-13 DIAGNOSIS — M25561 Pain in right knee: Secondary | ICD-10-CM

## 2022-02-13 DIAGNOSIS — S299XXA Unspecified injury of thorax, initial encounter: Secondary | ICD-10-CM

## 2022-02-13 DIAGNOSIS — W010XXA Fall on same level from slipping, tripping and stumbling without subsequent striking against object, initial encounter: Secondary | ICD-10-CM

## 2022-02-13 NOTE — Progress Notes (Signed)
Pt reports tripping over dog and fell to the concrete floor. Injuries to Right palm no abrasion, Right knee early stage bruise, Left under breast at rib cage early bruise and slight pain with deep breath. Lung sounds clear. VSS. Patient given Biofreeze, ace wrap for ribs, told to take tylenol. Told to use ice but he prefers heat. Educated if worsening to contact clinic will schedule appt with Otila Kluver Np.

## 2022-02-18 ENCOUNTER — Ambulatory Visit: Payer: Self-pay | Admitting: Occupational Medicine

## 2022-02-18 ENCOUNTER — Other Ambulatory Visit: Payer: Self-pay | Admitting: Primary Care

## 2022-02-18 DIAGNOSIS — W010XXA Fall on same level from slipping, tripping and stumbling without subsequent striking against object, initial encounter: Secondary | ICD-10-CM

## 2022-02-18 DIAGNOSIS — E1165 Type 2 diabetes mellitus with hyperglycemia: Secondary | ICD-10-CM

## 2022-02-18 DIAGNOSIS — S299XXA Unspecified injury of thorax, initial encounter: Secondary | ICD-10-CM

## 2022-02-18 DIAGNOSIS — M25561 Pain in right knee: Secondary | ICD-10-CM

## 2022-02-18 NOTE — Telephone Encounter (Signed)
Lvmtcb, sent mychart message  

## 2022-02-18 NOTE — Progress Notes (Signed)
Fu s/p fall patient reports almost healed. Only having slight pain to left upper chest near rib cage. Using tylenol for pain and taking epsom salt baths.

## 2022-02-18 NOTE — Telephone Encounter (Signed)
Patient is due for CPE/follow up, this will be required prior to any further refills.  Please schedule, thank you!   

## 2022-02-24 ENCOUNTER — Other Ambulatory Visit: Payer: Self-pay | Admitting: Cardiology

## 2022-02-25 ENCOUNTER — Ambulatory Visit: Payer: No Typology Code available for payment source | Admitting: Occupational Medicine

## 2022-02-25 ENCOUNTER — Ambulatory Visit (INDEPENDENT_AMBULATORY_CARE_PROVIDER_SITE_OTHER)
Admission: RE | Admit: 2022-02-25 | Discharge: 2022-02-25 | Disposition: A | Discharge status: Home / Self Care | Payer: No Typology Code available for payment source | Source: Ambulatory Visit | Attending: Family Medicine | Admitting: Family Medicine

## 2022-02-25 ENCOUNTER — Ambulatory Visit: Payer: No Typology Code available for payment source | Admitting: Family Medicine

## 2022-02-25 ENCOUNTER — Encounter: Payer: Self-pay | Admitting: Family Medicine

## 2022-02-25 ENCOUNTER — Telehealth: Payer: Self-pay | Admitting: Registered Nurse

## 2022-02-25 ENCOUNTER — Telehealth: Payer: Self-pay | Admitting: Primary Care

## 2022-02-25 ENCOUNTER — Encounter: Payer: Self-pay | Admitting: Registered Nurse

## 2022-02-25 VITALS — BP 135/88 | HR 89 | Temp 97.2°F

## 2022-02-25 VITALS — BP 120/60 | HR 82 | Temp 97.6°F | Ht 69.0 in | Wt 183.4 lb

## 2022-02-25 DIAGNOSIS — S2242XA Multiple fractures of ribs, left side, initial encounter for closed fracture: Secondary | ICD-10-CM | POA: Diagnosis not present

## 2022-02-25 DIAGNOSIS — R0781 Pleurodynia: Secondary | ICD-10-CM | POA: Diagnosis not present

## 2022-02-25 DIAGNOSIS — R079 Chest pain, unspecified: Secondary | ICD-10-CM

## 2022-02-25 MED ORDER — TRAMADOL HCL 50 MG PO TABS: 50.0000 mg | ORAL_TABLET | Freq: Three times a day (TID) | ORAL | 0 refills | Status: DC | PRN

## 2022-02-25 NOTE — Telephone Encounter (Signed)
Work note written as requested.

## 2022-02-25 NOTE — Telephone Encounter (Signed)
He can go back to work.  He should be able to work even if he takes tramadol.   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

## 2022-02-25 NOTE — Progress Notes (Signed)
Jacob Radke [name], MD, [place] at [facility] [place], [address]  Phone: [phone]  FAX: [place] - 62 y.o. male  MRN [id number]  Date of Birth: March 28, 1960  Date: 02/25/2022  PCP: Pleas Koch, NP  Referral: Pleas Koch, NP  Chief Complaint  Patient presents with   Fall    Tripped and fell over dog 2 weeks and landed on concrete floor   Rib Injury   Subjective:   Jacob Ali is a 62 y.o. very pleasant male patient with Body mass index is 27.08 kg/m. who presents with the following:  He is a very pleasant gentleman who I have seen in the past for shoulder pain, he presents today after a traumatic fall.  He fell 2 weeks ago tripping over his dog and landed directly on concrete floor.  He struck his left anterolateral thorax on the ground, and since then he has been having severe pain.  Pain is actually worsened over the last few days.  He notes that he has purulent pain with movement, coughing, and taking a deep breath.  He has not had any bruising or ecchymosis.  Fell on some concrete floor.  Hurting all the way across.  He is having a lot of pain.  Hurts at night, when trying to move and hit his L side.    No black or blue.   Right now having some severe pain.   Review of Systems is noted in the HPI, as appropriate  Objective:   BP 120/60   Pulse 82   Temp 97.6 F (36.4 C) (Temporal)   Ht 5' 9"$  (1.753 m)   Wt 183 lb 6 oz (83.2 kg)   SpO2 94%   BMI 27.08 kg/m   GEN: No acute distress; alert,appropriate. PULM: Breathing comfortably in no respiratory distress PSYCH: Normally interactive.   Entirety of the right ribs and wall of the chest are nontender.  The sternum is nontender, along with the clavicles.  He does have tenderness to palpate on the left lower ribs relatively diffusely.  Laboratory and Imaging Data: DG Ribs Unilateral Left  Result Date:  02/25/2022 CLINICAL DATA:  Patient fell 2 weeks ago . EXAM: LEFT RIBS - 2 VIEW COMPARISON:  Chest CTA 01/30/2021.  Chest x-ray 01/30/2021. FINDINGS: The lungs are clear without focal pneumonia, edema, pneumothorax or pleural effusion. The cardiopericardial silhouette is within normal limits for size. Oblique views of the left ribs were obtained. Radiopaque markers placed to localize the region of patient concern. Acute fractures of the anterior most aspects of the left seventh and eighth ribs identified on 2 of the oblique projections. IMPRESSION: Acute fractures of the anterior most aspects of the left seventh and eighth ribs. No evidence for pneumothorax or pleural effusion. Electronically Signed   By: Misty Stanley M.D.   On: 02/25/2022 12:23     Assessment and Plan:     ICD-10-CM   1. Closed fracture of multiple ribs of left side, initial encounter  S22.42XA     2. Rib pain on left side  R07.81 DG Ribs Unilateral Left     2 rib fractures on the left side.  This is causing of his some severe pain, and he has really not improved and is worse in 2 weeks into his specific injury.  I am getting give him some tramadol to take, and this is not enough that we can easily  get him some Vicodin.  He is going to use some Tylenol in addition for pain management.  Medication Management during today's office visit: Meds ordered this encounter  Medications   traMADol (ULTRAM) 50 MG tablet    Sig: Take 1 tablet (50 mg total) by mouth every 8 (eight) hours as needed for moderate pain.    Dispense:  20 tablet    Refill:  0   There are no discontinued medications.  Orders placed today for conditions managed today: Orders Placed This Encounter  Procedures   DG Ribs Unilateral Left    Disposition: No follow-ups on file.  Dragon [employer] One speech-to-text software was used for transcription in this dictation.  Possible transcriptional errors can occur using Editor, commissioning.   Signed,  Maud Deed.  Aleayah Chico, MD   Outpatient Encounter Medications as of 02/25/2022  Medication Sig   ACCU-CHEK GUIDE test strip USE UP TO 4 TIMES A DAY AS DIRECTED   Accu-Chek Softclix Lancets lancets USE UP TO 4 TIMES A DAY AS DIRECTED   bictegravir-emtricitabine-tenofovir AF (BIKTARVY) 50-200-25 MG TABS tablet TAKE 1 TABLET BY MOUTH DAILY.   carvedilol (COREG) 6.25 MG tablet TAKE 1 TABLET BY MOUTH 2 TIMES DAILY WITH A MEAL.   clopidogrel (PLAVIX) 75 MG tablet TAKE 1 TABLET BY MOUTH EVERY DAY   Cyanocobalamin (VITAMIN B 12 PO) Take 1 tablet by mouth 2 (two) times daily.   dapagliflozin propanediol (FARXIGA) 10 MG TABS tablet Take 1 tablet (10 mg total) by mouth daily before breakfast.   fenofibrate (TRICOR) 145 MG tablet Take 1 tablet (145 mg total) by mouth daily.   ferrous sulfate 324 MG TBEC Take 324 mg by mouth daily with breakfast.   fluticasone (FLONASE) 50 MCG/ACT nasal spray PLACE 1 SPRAY INTO BOTH NOSTRILS 2 (TWO) TIMES DAILY   lisinopril (ZESTRIL) 20 MG tablet Take 1 tablet (20 mg total) by mouth daily.   metFORMIN (GLUCOPHAGE-XR) 500 MG 24 hr tablet TAKE 1 TABLET (500 MG TOTAL) BY MOUTH DAILY WITH BREAKFAST. FOR DIABETES.   nitroGLYCERIN (NITROSTAT) 0.4 MG SL tablet PLACE 1 TABLET UNDER THE TONGUE EVERY 5 MINUTES FOR 3 DOSES AS NEEDED FOR CHEST PAIN   pantoprazole (PROTONIX) 40 MG tablet Take 1 tablet (40 mg total) by mouth daily. For heartburn.   rosuvastatin (CRESTOR) 40 MG tablet TAKE 1 TABLET BY MOUTH EVERY DAY   traMADol (ULTRAM) 50 MG tablet Take 1 tablet (50 mg total) by mouth every 8 (eight) hours as needed for moderate pain.   No facility-administered encounter medications on file as of 02/25/2022.

## 2022-02-25 NOTE — Progress Notes (Signed)
Patient comes in today for worsening left side rib pain from trip and fall 2 weeks ago. Patient states when he coughs it hurts on the right rib too now. Pt denies Shob or chest tightness. Lung sounds clear. Patient reports pain is 8/10 just sitting when moving 10/10. Ibuprofen and naproxen not helping. Patients O2 SATs 89-93% RA sitting. Notice he doesn't take normal breaths or deep enough breaths because of the pain. When taking deep breathes 95%. Ronni Rumble NP to get recommendations. Requested him take nitro tab to makes sure doesn't help hx of angina. Also COVID test. Patient took his personal nitro sl tab not pain relief. Felt little more relaxed. Covid test negative. Called back [place] NP she requested him see his PCP for appt to get CXR and EKG. Called Dr. Anell Barr office she wasn't in today but could see another provider at 1140 today.

## 2022-02-25 NOTE — Telephone Encounter (Signed)
Jacob Ali notified as instructed by telephone.  He states he took a Tramadol when he got home and so far it has done nothing for the pain.  I advised to give the tramadol a little more time to see if it helps, since he has only had one dose so far.   Patient should be able to return to work on Wednesday.

## 2022-02-25 NOTE — Telephone Encounter (Unsigned)
Patient with history AMI/cocaine use/CAD/LAD angioplasty & stent 2023/unstable angina/GERD/type 2 diabetes/subdural hematoma/HIV/.  Recurrent chest pain typically relieved with nitroglycerin.  Presented to [facility] [employer] today with radiating chest pain different from his usual.  Last nitroglycerin use Friday 02/22/22 relieved chest pain.  Sp02 in clinic today 89% - 93% RA per RN Kimrey BBS CTA spoke full sentences without difficulty.  [place] last week after tripping over dog (new puppy).  Certain body movements worsen pain.  Has appt scheduled with [facility] Friday 03/01/22.  VSS see RN Kimrey note.  Patient has not taken nitroglycerin today discussed trial 1 dose to see if chest pain resolves.  Home covid test to be completed.  No EKG or imaging available at [facility] clinic.  Patient A&Ox3 skin warm dry and pink rating pain 8-10/10.  Discussed with patient and RN Evlyn Kanner if chest pain not relieved with nitroglycerin I recommend same day appt with provider/contact PCM office first.  Due to cardiac history need to rule out first with office that is able to perform EKG/imaging.  Stated was sore last week in chest after fall but worsening today.  Patient stated he will contact his [place] office for sooner appt.    RN Evlyn Kanner returned call stated home covid test results negative and patient feeling more relaxed after nitroglycerin x 1 dose but did not relieve any chest pain.  I recommended appt with PCM/UC today for EKG/imaging s/p fall new/worsening chest pain that is different from his typical angina and not responding to nitroglycerin or OTC pain medication.  RN Evlyn Kanner and patient verbalized understanding information/instructions, agreed with plan of care and had no further questions at this time.

## 2022-02-25 NOTE — Telephone Encounter (Signed)
Kimrey from [employer] called in and stated that they need a letter stating that he can work and take the Tramadol and come back to work on Wednesday. She will be able to see it once its put into [employer]. Thank you!

## 2022-02-25 NOTE — Telephone Encounter (Signed)
Patient was seen in office today for cracked ribs. He would like to know if he is okay to go to work,or if he needs to stay out,and for how long? He was prescribed tramadol,and would like to know should he take the medication at work if he is okay to go back? He stated that a response can be sent to him through mychart if that quicker than a phone call.

## 2022-02-26 NOTE — Telephone Encounter (Signed)
Patient called to follow up on RTW and pain control related to Rib fractures.  Pt states doing better. Pain is 2/10, only slight drowsy, RTW tomorrow. HR and Supervisor made aware.

## 2022-02-27 ENCOUNTER — Telehealth: Payer: Self-pay | Admitting: Registered Nurse

## 2022-02-27 ENCOUNTER — Ambulatory Visit: Payer: No Typology Code available for payment source | Admitting: Occupational Medicine

## 2022-02-27 DIAGNOSIS — S2242XD Multiple fractures of ribs, left side, subsequent encounter for fracture with routine healing: Secondary | ICD-10-CM

## 2022-02-27 NOTE — Progress Notes (Addendum)
Patient reports resting and taking tramadol for pain has helped. RTW today. This morning no pain. Pain with certain movements. Patient reports not taking tramadol at work unless has too. Patient knows if needs anything to contact the clinic. Lung sounds clear.

## 2022-02-27 NOTE — Telephone Encounter (Signed)
Notified by RN Evlyn Kanner patient RTW as expected today still taking tramadol respirations even and unlabored BBS CTA skin warm dry and pink.  Patient denied questions or concerns.

## 2022-03-01 ENCOUNTER — Encounter: Payer: Self-pay | Admitting: Primary Care

## 2022-03-01 ENCOUNTER — Ambulatory Visit (INDEPENDENT_AMBULATORY_CARE_PROVIDER_SITE_OTHER): Payer: No Typology Code available for payment source | Admitting: Primary Care

## 2022-03-01 ENCOUNTER — Other Ambulatory Visit: Payer: Self-pay | Admitting: Family Medicine

## 2022-03-01 VITALS — BP 128/64 | HR 75 | Temp 98.1°F | Ht 69.0 in | Wt 184.0 lb

## 2022-03-01 DIAGNOSIS — Z0000 Encounter for general adult medical examination without abnormal findings: Secondary | ICD-10-CM

## 2022-03-01 DIAGNOSIS — B20 Human immunodeficiency virus [HIV] disease: Secondary | ICD-10-CM

## 2022-03-01 DIAGNOSIS — I10 Essential (primary) hypertension: Secondary | ICD-10-CM

## 2022-03-01 DIAGNOSIS — K219 Gastro-esophageal reflux disease without esophagitis: Secondary | ICD-10-CM | POA: Diagnosis not present

## 2022-03-01 DIAGNOSIS — I2510 Atherosclerotic heart disease of native coronary artery without angina pectoris: Secondary | ICD-10-CM | POA: Diagnosis not present

## 2022-03-01 DIAGNOSIS — E782 Mixed hyperlipidemia: Secondary | ICD-10-CM

## 2022-03-01 DIAGNOSIS — E1165 Type 2 diabetes mellitus with hyperglycemia: Secondary | ICD-10-CM

## 2022-03-01 DIAGNOSIS — F17200 Nicotine dependence, unspecified, uncomplicated: Secondary | ICD-10-CM

## 2022-03-01 DIAGNOSIS — K921 Melena: Secondary | ICD-10-CM

## 2022-03-01 DIAGNOSIS — Z125 Encounter for screening for malignant neoplasm of prostate: Secondary | ICD-10-CM

## 2022-03-01 DIAGNOSIS — E538 Deficiency of other specified B group vitamins: Secondary | ICD-10-CM

## 2022-03-01 DIAGNOSIS — F411 Generalized anxiety disorder: Secondary | ICD-10-CM

## 2022-03-01 MED ORDER — TRAMADOL HCL 50 MG PO TABS: 50.0000 mg | ORAL_TABLET | Freq: Three times a day (TID) | ORAL | 0 refills | Status: DC | PRN

## 2022-03-01 NOTE — Assessment & Plan Note (Signed)
Evaluated by GI, colonoscopy was deferred. Improved.  Continue oral iron daily. Repeat CBC and iron studies pending.

## 2022-03-01 NOTE — Assessment & Plan Note (Addendum)
Repeat A1C pending today.   Continue metformin XR 500 mg daily, Farxiga 10 mg daily.   Follow up in 3-6 months based on A1C report.

## 2022-03-01 NOTE — Assessment & Plan Note (Signed)
Repeat lipid panel pending. Continue Crestor 40 mg daily and fenofibrate 145 mg daily.

## 2022-03-01 NOTE — Progress Notes (Signed)
Subjective:    Patient ID: Jacob Ali, male    DOB: 05-25-1960, 62 y.o.   MRN: [id number]  HPI  Jacob Ali is a very pleasant 62 y.o. male who presents today for complete physical and follow up of chronic conditions.  Immunizations: -Tetanus: Completed in 2022 -Influenza: Completed this season -Shingles: Completed Shingrix series -Pneumonia: Completed in 2020  Diet: [place].  Exercise: No regular exercise.  Eye exam: Completes annually Dental exam: Completes semi-annually   Colonoscopy: Completed in 2019, due 2024 Lung Cancer Screening: Never completed.   PSA: Due   BP Readings from Last 3 Encounters:  03/01/22 128/64  02/25/22 120/60  02/25/22 135/88        Review of Systems  Constitutional:  Negative for unexpected weight change.  HENT:  Negative for rhinorrhea.   Respiratory:  Negative for cough and shortness of breath.   Cardiovascular:  Negative for chest pain.  Gastrointestinal:  Negative for constipation and diarrhea.  Genitourinary:  Negative for difficulty urinating.  Musculoskeletal:  Positive for arthralgias and myalgias.  Skin:  Negative for rash.  Allergic/Immunologic: Negative for environmental allergies.  Neurological:  Negative for dizziness and headaches.  Psychiatric/Behavioral:  The patient is not nervous/anxious.          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   Subdural hemorrhage (HCC)    Tubular adenoma of colon 11/03/2017   Colonoscopy Sept 9, 2019    Social History   Socioeconomic History   Marital status: Single    Spouse name: Not on file    Number of children: Not on file   Years of education: 12   Highest education level: High school graduate  Occupational History   Occupation: Recruitment consultant: [employer]  Tobacco Use   Smoking status: Every Day    Packs/day: 0.50    Years: 40.00    Total pack years: 20.00    Types: Cigars, Cigarettes   Smokeless tobacco: Never   Tobacco comments:    1/2PPD Jacob Ali was wearing a nicotine patch today and says he plans to quit smoking. Given the phone number to 1-800=quit-now   Vaping Use   Vaping Use: Former  Substance and Sexual Activity   Alcohol use: Yes    Alcohol/week: 2.0 standard drinks of alcohol    Types: 2 Standard drinks or equivalent per week    Comment: 1-2 times a week   Drug use: Not Currently    Types: Marijuana    Comment: 2 per month   Sexual activity: Not Currently    Partners: Male    Birth control/protection: Condom    Comment: pt. given condoms  Other Topics Concern   Not on file  Social History Narrative   Single, lives alone.     Social Determinants of Health   Financial Resource Strain: Not on file  Food Insecurity: Not on file  Transportation Needs: Not on file  Physical Activity: Not on file  Stress: Not on file  Social Connections: Not on file  Intimate Partner  Violence: Not on file    Past Surgical History:  Procedure Laterality Date   CARDIAC CATHETERIZATION     CORONARY STENT INTERVENTION N/A 01/30/2021   Procedure: CORONARY STENT INTERVENTION;  Surgeon: Belva Crome, MD;  Location: [facility];  Service: Cardiovascular;  Laterality: N/A;   Examination under anesthesia, repair of anal fissure,  12/04/2000   LEFT HEART CATH AND CORONARY ANGIOGRAPHY N/A 01/30/2021   Procedure: LEFT HEART CATH AND CORONARY ANGIOGRAPHY;  Surgeon: Belva Crome, MD;  Location: [facility];  Service: Cardiovascular;  Laterality: N/A;   PERIPHERAL VASCULAR CATHETERIZATION Left 02/06/2016   Procedure: Lower Extremity  Angiography;  Surgeon: Katha Cabal, MD;  Location: [facility];  Service: Cardiovascular;  Laterality: Left;   PERIPHERAL VASCULAR CATHETERIZATION N/A 02/06/2016   Procedure: Abdominal Aortogram w/Lower Extremity;  Surgeon: Katha Cabal, MD;  Location: [facility];  Service: Cardiovascular;  Laterality: N/A;   PERIPHERAL VASCULAR CATHETERIZATION  02/06/2016   Procedure: Lower Extremity Intervention;  Surgeon: Katha Cabal, MD;  Location: [facility];  Service: Cardiovascular;;    Family History  Problem Relation Age of Onset   Hypertension Mother    Asthma Mother    Throat cancer Father    Heart disease Father    Cancer Father        bladder   Cancer Maternal Grandmother        colon    Allergies  Allergen Reactions   Atorvastatin Other (See Comments)    myalgias   Tamiflu [Oseltamivir Phosphate] Other (See Comments)    Depressive symptoms    Current Outpatient Medications on File Prior to Visit  Medication Sig Dispense Refill   ACCU-CHEK GUIDE test strip USE UP TO 4 TIMES A DAY AS DIRECTED 300 strip 5   Accu-Chek Softclix Lancets lancets USE UP TO 4 TIMES A DAY AS DIRECTED 100 each 5   bictegravir-emtricitabine-tenofovir AF (BIKTARVY) 50-200-25 MG TABS tablet TAKE 1 TABLET BY MOUTH DAILY. 30 tablet 6   carvedilol (COREG) 6.25 MG tablet TAKE 1 TABLET BY MOUTH 2 TIMES DAILY WITH A MEAL. 180 tablet 3   clopidogrel (PLAVIX) 75 MG tablet TAKE 1 TABLET BY MOUTH EVERY DAY 90 tablet 2   Cyanocobalamin (VITAMIN B 12 PO) Take 1 tablet by mouth 2 (two) times daily.     dapagliflozin propanediol (FARXIGA) 10 MG TABS tablet Take 1 tablet (10 mg total) by mouth daily before breakfast. 30 tablet 6   fenofibrate (TRICOR) 145 MG tablet Take 1 tablet (145 mg total) by mouth daily. 90 tablet 3   ferrous sulfate 324 MG TBEC Take 324 mg by mouth daily with breakfast.     fluticasone (FLONASE) 50 MCG/ACT nasal spray PLACE 1 SPRAY INTO BOTH NOSTRILS 2 (TWO)  TIMES DAILY 48 mL 0   lisinopril (ZESTRIL) 20 MG tablet Take 1 tablet (20 mg total) by mouth daily. 90 tablet 3   metFORMIN (GLUCOPHAGE-XR) 500 MG 24 hr tablet TAKE 1 TABLET (500 MG TOTAL) BY MOUTH DAILY WITH BREAKFAST. FOR DIABETES. 30 tablet 0   nitroGLYCERIN (NITROSTAT) 0.4 MG SL tablet PLACE 1 TABLET UNDER THE TONGUE EVERY 5 MINUTES FOR 3 DOSES AS NEEDED FOR CHEST PAIN 25 tablet 3   pantoprazole (PROTONIX) 40 MG tablet Take 1 tablet (40 mg total) by mouth daily. For heartburn. 90 tablet 0   rosuvastatin (CRESTOR) 40 MG tablet TAKE 1 TABLET BY MOUTH EVERY DAY 90 tablet 3   No current  facility-administered medications on file prior to visit.    BP 128/64   Pulse 75   Temp 98.1 F (36.7 C) (Temporal)   Ht 5' 9"$  (1.753 m)   Wt 184 lb (83.5 kg)   SpO2 96%   BMI 27.17 kg/m  Objective:   Physical Exam HENT:     Right Ear: Tympanic membrane and ear canal normal.     Left Ear: Tympanic membrane and ear canal normal.     Nose: Nose normal.     Right Sinus: No maxillary sinus tenderness or frontal sinus tenderness.     Left Sinus: No maxillary sinus tenderness or frontal sinus tenderness.  Eyes:     Conjunctiva/sclera: Conjunctivae normal.  Neck:     Thyroid: No thyromegaly.     Vascular: No carotid bruit.  Cardiovascular:     Rate and Rhythm: Normal rate and regular rhythm.     Heart sounds: Normal heart sounds.  Pulmonary:     Effort: Pulmonary effort is normal.     Breath sounds: Normal breath sounds. No wheezing or rales.  Abdominal:     General: Bowel sounds are normal.     Palpations: Abdomen is soft.     Tenderness: There is no abdominal tenderness.  Musculoskeletal:        General: Normal range of motion.     Cervical back: Neck supple.  Skin:    General: Skin is warm and dry.  Neurological:     Mental Status: He is alert and oriented to person, place, and time.     Cranial Nerves: No cranial nerve deficit.     Deep Tendon Reflexes: Reflexes are normal and  symmetric.  Psychiatric:        Mood and Affect: Mood normal.           Assessment & Plan:  Preventative health care Assessment & Plan: Immunizations UTD. Colonoscopy due this year, he is aware. PSA due and pending. Referral placed for lung cancer screening.  Discussed the importance of a healthy diet and regular exercise in order for weight loss, and to reduce the risk of further co-morbidity.  Exam stable. Labs pending.  Follow up in 1 year for repeat physical.    CAD in native artery Assessment & Plan: Follow with cardiology, office notes reviewed from January 2024. Continue BP control, lipid control.   Essential (primary) hypertension Assessment & Plan: Controlled.  Continue carvedilol 6.25 mg BID, lisinopril 20 mg daily. CMP reviewed until October 2023.  Orders: -     Basic metabolic panel  Gastroesophageal reflux disease without esophagitis Assessment & Plan: Controlled.  Continue pantoprazole 40 mg daily.   Type 2 diabetes mellitus with hyperglycemia, without long-term current use of insulin (HCC) Assessment & Plan: Repeat A1C pending today.   Continue metformin XR 500 mg daily, Farxiga 10 mg daily.   Follow up in 3-6 months based on A1C report.   Orders: -     Microalbumin / creatinine urine ratio -     Hemoglobin A1c  B12 deficiency Assessment & Plan: Continue supplements twice daily, unknown dose. Repeat B12 level pending.   Generalized anxiety disorder Assessment & Plan: Controlled.  Continue to monitor.    HIV infection, unspecified symptom status ([place]) Assessment & Plan: Following with infectious disease, reviewed labs and office notes from November 2023. Continue Biktarvy 50-200-25 mg daily.   Mixed hyperlipidemia Assessment & Plan: Repeat lipid panel pending. Continue Crestor 40 mg daily and fenofibrate 145 mg daily.  Orders: -     Lipid panel  Tobacco use disorder Assessment & Plan: Qualifies for lung  cancer screening. Referral placed.  Orders: -     Ambulatory Referral for Lung Cancer Scre  Black stools Assessment & Plan: Evaluated by GI, colonoscopy was deferred. Improved.  Continue oral iron daily. Repeat CBC and iron studies pending.  Orders: -     CBC -     Iron, TIBC and Ferritin Panel -     Vitamin B12  Screening for prostate cancer -     PSA        Pleas Koch, NP

## 2022-03-01 NOTE — Assessment & Plan Note (Signed)
Continue supplements twice daily, unknown dose. Repeat B12 level pending.

## 2022-03-01 NOTE — Assessment & Plan Note (Signed)
Controlled.  Continue pantoprazole 40 mg daily.

## 2022-03-01 NOTE — Telephone Encounter (Signed)
Patient reported tried to hold tramadol at work on Wednesday but pain got unbearable in the afternoon.  Improved after taking tramadol when he was home Wednesday.  Feeling well yesterday using tramadol prn.  Saw patient in warehouse walking dog.  He reported at time unable to tell difference between angina and rib pain will take nitroglycerin and if no relief and time for prn pain medication then he will take pain medication and typically chest pain resolves.  Denied new shortness of breath/cough/hemoptysis/wheezing/dyspnea with exertion.  A&Ox3 spoke full sentences without difficulty gait sure and steady respirations even and unlabored skin warm dry and pink.  Follow up for re-evaluation if new or worsening symptoms.  Patient agreed with plan of care and had no further questions at that time.

## 2022-03-01 NOTE — Assessment & Plan Note (Signed)
Immunizations UTD. Colonoscopy due this year, he is aware. PSA due and pending. Referral placed for lung cancer screening.  Discussed the importance of a healthy diet and regular exercise in order for weight loss, and to reduce the risk of further co-morbidity.  Exam stable. Labs pending.  Follow up in 1 year for repeat physical.

## 2022-03-01 NOTE — Assessment & Plan Note (Signed)
Controlled.  Continue to monitor.

## 2022-03-01 NOTE — Assessment & Plan Note (Signed)
Qualifies for lung cancer screening. Referral placed.

## 2022-03-01 NOTE — Assessment & Plan Note (Signed)
Following with infectious disease, reviewed labs and office notes from November 2023. Continue Biktarvy 50-200-25 mg daily.

## 2022-03-01 NOTE — Patient Instructions (Signed)
Stop by the lab prior to leaving today. I will notify you of your results once received.   You will either be contacted via phone regarding your referral to lung cancer screening program, or you may receive a letter on your MyChart portal from our referral team with instructions for scheduling an appointment. Please let us know if you have not been contacted by anyone within two weeks.  Please schedule a follow up visit for 6 months for a diabetes check.

## 2022-03-01 NOTE — Assessment & Plan Note (Signed)
Controlled.  Continue carvedilol 6.25 mg BID, lisinopril 20 mg daily. CMP reviewed until October 2023.

## 2022-03-01 NOTE — Assessment & Plan Note (Signed)
Follow with cardiology, office notes reviewed from January 2024. Continue BP control, lipid control.

## 2022-03-02 LAB — HEMOGLOBIN A1C
Hgb A1c MFr Bld: 4.8 % of total Hgb (ref <5.7)
Mean Plasma Glucose: 91 mg/dL
eAG (mmol/L): 5 mmol/L

## 2022-03-02 LAB — BASIC METABOLIC PANEL
BUN: 13 mg/dL (ref 7–25)
CO2: 24 mmol/L (ref 20–32)
Calcium: 9.4 mg/dL (ref 8.6–10.3)
Chloride: 107 mmol/L (ref 98–110)
Creat: 0.99 mg/dL (ref 0.70–1.35)
Glucose, Bld: 98 mg/dL (ref 65–99)
Potassium: 4.2 mmol/L (ref 3.5–5.3)
Sodium: 139 mmol/L (ref 135–146)

## 2022-03-02 LAB — LIPID PANEL
Cholesterol: 103 mg/dL (ref <200)
HDL: 29 mg/dL — ABNORMAL LOW (ref >=40)
LDL Cholesterol (Calc): 41 mg/dL (calc)
Non-HDL Cholesterol (Calc): 74 mg/dL (calc) (ref <130)
Total CHOL/HDL Ratio: 3.6 (calc) (ref <5.0)
Triglycerides: 310 mg/dL — ABNORMAL HIGH (ref <150)

## 2022-03-02 LAB — CBC
HCT: 37 % — ABNORMAL LOW (ref 38.5–50.0)
Hemoglobin: 12.7 g/dL — ABNORMAL LOW (ref 13.2–17.1)
MCH: 34.3 pg — ABNORMAL HIGH (ref 27.0–33.0)
MCHC: 34.3 g/dL (ref 32.0–36.0)
MCV: 100 fL (ref 80.0–100.0)
MPV: 10.9 fL (ref 7.5–12.5)
Platelets: 258 10*3/uL (ref 140–400)
RBC: 3.7 10*6/uL — ABNORMAL LOW (ref 4.20–5.80)
RDW: 11.8 % (ref 11.0–15.0)
WBC: 8.8 10*3/uL (ref 3.8–10.8)

## 2022-03-02 LAB — MICROALBUMIN / CREATININE URINE RATIO
Creatinine, Urine: 57 mg/dL (ref 20–320)
Microalb Creat Ratio: 4 mcg/mg creat (ref <30)
Microalb, Ur: 0.2 mg/dL

## 2022-03-02 LAB — IRON,TIBC AND FERRITIN PANEL
%SAT: 22 % (calc) (ref 20–48)
Ferritin: 173 ng/mL (ref 24–380)
Iron: 91 ug/dL (ref 50–180)
TIBC: 417 mcg/dL (calc) (ref 250–425)

## 2022-03-02 LAB — VITAMIN B12: Vitamin B-12: 1955 pg/mL — ABNORMAL HIGH (ref 200–1100)

## 2022-03-02 LAB — PSA: PSA: 0.54 ng/mL (ref <=4.00)

## 2022-03-05 ENCOUNTER — Other Ambulatory Visit (HOSPITAL_COMMUNITY): Payer: Self-pay

## 2022-03-08 ENCOUNTER — Other Ambulatory Visit (HOSPITAL_COMMUNITY): Payer: Self-pay

## 2022-03-11 ENCOUNTER — Other Ambulatory Visit: Payer: Self-pay

## 2022-03-14 ENCOUNTER — Other Ambulatory Visit: Payer: Self-pay | Admitting: Primary Care

## 2022-03-14 DIAGNOSIS — E1165 Type 2 diabetes mellitus with hyperglycemia: Secondary | ICD-10-CM

## 2022-03-28 ENCOUNTER — Other Ambulatory Visit (HOSPITAL_COMMUNITY): Payer: Self-pay

## 2022-03-28 ENCOUNTER — Encounter: Payer: Self-pay | Admitting: Registered Nurse

## 2022-03-28 ENCOUNTER — Telehealth: Payer: Self-pay | Admitting: Registered Nurse

## 2022-03-28 DIAGNOSIS — R112 Nausea with vomiting, unspecified: Secondary | ICD-10-CM

## 2022-03-28 MED ORDER — ONDANSETRON 8 MG PO TBDP: 8.0000 mg | ORAL_TABLET | Freq: Three times a day (TID) | ORAL | 0 refills | Status: DC | PRN

## 2022-03-28 NOTE — Telephone Encounter (Signed)
Patient reported vomiting and diarrhea/chills last night 0200.  Feeling dehydrated today called ems they recommended electrolytes  BP 122/70 HR 96 96% RA sp02 stated some leg cramps.  Diarrhea today last dry heaving 0200 today.  Patient has taken ondansetron in the past without difficulty refill Rx sent to his pharmacy of choice and sister going to pick up for him. I have recommended clear fluids and bland diet.  Avoid dairy/spicy, fried and large portions of meat while having nausea.  If vomiting hold po intake x 1 hour.  Then sips clear fluids like broths, ginger ale, power ade, gatorade, pedialyte may advance to soft/bland if no vomiting x 24 hours and appetite returned otherwise hydration main focus.   I have alerted the patient to call if high fever, dehydration, marked weakness, fainting, increased abdominal pain, blood in stool or vomit (red or black).   Exitcare handouts on diarrhea, foods to relieve diarrhea, nausea/vomiting and viral gastroenteritis sent to my chart.  Discussed with patient mildly dehydrated today.  Coffee/Tea are diuretic recommended gatorade/nondairy popsicles/ginger ale/broths first line if possible or alternating tea/water.  Sister and  Patient verbalized agreement and understanding of treatment plan and had no further questions at this time.

## 2022-03-29 ENCOUNTER — Inpatient Hospital Stay (HOSPITAL_BASED_OUTPATIENT_CLINIC_OR_DEPARTMENT_OTHER)
Admission: EM | Admit: 2022-03-29 | Discharge: 2022-04-02 | DRG: 682 | Disposition: A | Discharge status: Home / Self Care | Payer: No Typology Code available for payment source | Attending: Internal Medicine | Admitting: Internal Medicine

## 2022-03-29 ENCOUNTER — Emergency Department (HOSPITAL_BASED_OUTPATIENT_CLINIC_OR_DEPARTMENT_OTHER): Payer: No Typology Code available for payment source

## 2022-03-29 ENCOUNTER — Encounter (HOSPITAL_BASED_OUTPATIENT_CLINIC_OR_DEPARTMENT_OTHER): Payer: Self-pay | Admitting: Emergency Medicine

## 2022-03-29 ENCOUNTER — Other Ambulatory Visit: Payer: Self-pay

## 2022-03-29 DIAGNOSIS — Z8249 Family history of ischemic heart disease and other diseases of the circulatory system: Secondary | ICD-10-CM | POA: Diagnosis not present

## 2022-03-29 DIAGNOSIS — A080 Rotaviral enteritis: Secondary | ICD-10-CM | POA: Diagnosis present

## 2022-03-29 DIAGNOSIS — Z79899 Other long term (current) drug therapy: Secondary | ICD-10-CM | POA: Diagnosis not present

## 2022-03-29 DIAGNOSIS — Z800 Family history of malignant neoplasm of digestive organs: Secondary | ICD-10-CM | POA: Diagnosis not present

## 2022-03-29 DIAGNOSIS — Z808 Family history of malignant neoplasm of other organs or systems: Secondary | ICD-10-CM

## 2022-03-29 DIAGNOSIS — Z7902 Long term (current) use of antithrombotics/antiplatelets: Secondary | ICD-10-CM

## 2022-03-29 DIAGNOSIS — E119 Type 2 diabetes mellitus without complications: Secondary | ICD-10-CM

## 2022-03-29 DIAGNOSIS — E785 Hyperlipidemia, unspecified: Secondary | ICD-10-CM | POA: Diagnosis present

## 2022-03-29 DIAGNOSIS — E782 Mixed hyperlipidemia: Secondary | ICD-10-CM | POA: Diagnosis not present

## 2022-03-29 DIAGNOSIS — Z85038 Personal history of other malignant neoplasm of large intestine: Secondary | ICD-10-CM

## 2022-03-29 DIAGNOSIS — Z825 Family history of asthma and other chronic lower respiratory diseases: Secondary | ICD-10-CM

## 2022-03-29 DIAGNOSIS — I10 Essential (primary) hypertension: Secondary | ICD-10-CM | POA: Diagnosis present

## 2022-03-29 DIAGNOSIS — I21A1 Myocardial infarction type 2: Secondary | ICD-10-CM | POA: Diagnosis present

## 2022-03-29 DIAGNOSIS — Z95828 Presence of other vascular implants and grafts: Secondary | ICD-10-CM

## 2022-03-29 DIAGNOSIS — Z1152 Encounter for screening for COVID-19: Secondary | ICD-10-CM | POA: Diagnosis not present

## 2022-03-29 DIAGNOSIS — N179 Acute kidney failure, unspecified: Principal | ICD-10-CM | POA: Diagnosis present

## 2022-03-29 DIAGNOSIS — E871 Hypo-osmolality and hyponatremia: Secondary | ICD-10-CM | POA: Diagnosis present

## 2022-03-29 DIAGNOSIS — R112 Nausea with vomiting, unspecified: Secondary | ICD-10-CM | POA: Diagnosis not present

## 2022-03-29 DIAGNOSIS — B20 Human immunodeficiency virus [HIV] disease: Secondary | ICD-10-CM | POA: Diagnosis not present

## 2022-03-29 DIAGNOSIS — Z888 Allergy status to other drugs, medicaments and biological substances status: Secondary | ICD-10-CM | POA: Diagnosis not present

## 2022-03-29 DIAGNOSIS — F17210 Nicotine dependence, cigarettes, uncomplicated: Secondary | ICD-10-CM | POA: Diagnosis present

## 2022-03-29 DIAGNOSIS — I214 Non-ST elevation (NSTEMI) myocardial infarction: Secondary | ICD-10-CM | POA: Diagnosis not present

## 2022-03-29 DIAGNOSIS — Z955 Presence of coronary angioplasty implant and graft: Secondary | ICD-10-CM | POA: Diagnosis not present

## 2022-03-29 DIAGNOSIS — K529 Noninfective gastroenteritis and colitis, unspecified: Secondary | ICD-10-CM

## 2022-03-29 DIAGNOSIS — I252 Old myocardial infarction: Secondary | ICD-10-CM

## 2022-03-29 DIAGNOSIS — E1151 Type 2 diabetes mellitus with diabetic peripheral angiopathy without gangrene: Secondary | ICD-10-CM | POA: Diagnosis present

## 2022-03-29 DIAGNOSIS — Z8673 Personal history of transient ischemic attack (TIA), and cerebral infarction without residual deficits: Secondary | ICD-10-CM

## 2022-03-29 DIAGNOSIS — E1165 Type 2 diabetes mellitus with hyperglycemia: Secondary | ICD-10-CM

## 2022-03-29 DIAGNOSIS — R7989 Other specified abnormal findings of blood chemistry: Secondary | ICD-10-CM | POA: Diagnosis not present

## 2022-03-29 DIAGNOSIS — I2510 Atherosclerotic heart disease of native coronary artery without angina pectoris: Secondary | ICD-10-CM | POA: Diagnosis present

## 2022-03-29 DIAGNOSIS — E875 Hyperkalemia: Secondary | ICD-10-CM | POA: Diagnosis not present

## 2022-03-29 DIAGNOSIS — G4733 Obstructive sleep apnea (adult) (pediatric): Secondary | ICD-10-CM | POA: Diagnosis present

## 2022-03-29 DIAGNOSIS — Z8052 Family history of malignant neoplasm of bladder: Secondary | ICD-10-CM

## 2022-03-29 DIAGNOSIS — K219 Gastro-esophageal reflux disease without esophagitis: Secondary | ICD-10-CM | POA: Diagnosis present

## 2022-03-29 DIAGNOSIS — E538 Deficiency of other specified B group vitamins: Secondary | ICD-10-CM | POA: Diagnosis present

## 2022-03-29 DIAGNOSIS — R079 Chest pain, unspecified: Secondary | ICD-10-CM | POA: Diagnosis not present

## 2022-03-29 DIAGNOSIS — R197 Diarrhea, unspecified: Secondary | ICD-10-CM | POA: Diagnosis not present

## 2022-03-29 DIAGNOSIS — E872 Acidosis, unspecified: Secondary | ICD-10-CM | POA: Diagnosis present

## 2022-03-29 DIAGNOSIS — Z7984 Long term (current) use of oral hypoglycemic drugs: Secondary | ICD-10-CM

## 2022-03-29 DIAGNOSIS — R5381 Other malaise: Secondary | ICD-10-CM | POA: Diagnosis present

## 2022-03-29 DIAGNOSIS — Z21 Asymptomatic human immunodeficiency virus [HIV] infection status: Secondary | ICD-10-CM | POA: Diagnosis present

## 2022-03-29 LAB — COMPREHENSIVE METABOLIC PANEL
ALT: 22 U/L (ref 0–44)
AST: 31 U/L (ref 15–41)
Albumin: 4.7 g/dL (ref 3.5–5.0)
Alkaline Phosphatase: 35 U/L — ABNORMAL LOW (ref 38–126)
Anion gap: 23 — ABNORMAL HIGH (ref 5–15)
BUN: 90 mg/dL — ABNORMAL HIGH (ref 8–23)
CO2: 13 mmol/L — ABNORMAL LOW (ref 22–32)
Calcium: 8.6 mg/dL — ABNORMAL LOW (ref 8.9–10.3)
Chloride: 96 mmol/L — ABNORMAL LOW (ref 98–111)
Creatinine, Ser: 8.44 mg/dL — ABNORMAL HIGH (ref 0.61–1.24)
GFR, Estimated: 7 mL/min — ABNORMAL LOW (ref >60)
Glucose, Bld: 115 mg/dL — ABNORMAL HIGH (ref 70–99)
Potassium: 5.3 mmol/L — ABNORMAL HIGH (ref 3.5–5.1)
Sodium: 132 mmol/L — ABNORMAL LOW (ref 135–145)
Total Bilirubin: 0.5 mg/dL (ref 0.3–1.2)
Total Protein: 8.1 g/dL (ref 6.5–8.1)

## 2022-03-29 LAB — URINALYSIS, ROUTINE W REFLEX MICROSCOPIC
Bacteria, UA: NONE SEEN
Bilirubin Urine: NEGATIVE
Glucose, UA: 100 mg/dL — AB
Ketones, ur: NEGATIVE mg/dL
Leukocytes,Ua: NEGATIVE
Nitrite: NEGATIVE
Protein, ur: 30 mg/dL — AB
Specific Gravity, Urine: 1.018 (ref 1.005–1.030)
pH: 5 (ref 5.0–8.0)

## 2022-03-29 LAB — RESP PANEL BY RT-PCR (RSV, FLU A&B, COVID)  RVPGX2
Influenza A by PCR: NEGATIVE
Influenza B by PCR: NEGATIVE
Resp Syncytial Virus by PCR: NEGATIVE
SARS Coronavirus 2 by RT PCR: NEGATIVE

## 2022-03-29 LAB — CBC
HCT: 48.1 % (ref 39.0–52.0)
Hemoglobin: 15.9 g/dL (ref 13.0–17.0)
MCH: 33.5 pg (ref 26.0–34.0)
MCHC: 33.1 g/dL (ref 30.0–36.0)
MCV: 101.5 fL — ABNORMAL HIGH (ref 80.0–100.0)
Platelets: 356 10*3/uL (ref 150–400)
RBC: 4.74 MIL/uL (ref 4.22–5.81)
RDW: 13 % (ref 11.5–15.5)
WBC: 12.2 10*3/uL — ABNORMAL HIGH (ref 4.0–10.5)
nRBC: 0 % (ref 0.0–0.2)

## 2022-03-29 LAB — MAGNESIUM: Magnesium: 2.2 mg/dL (ref 1.7–2.4)

## 2022-03-29 MED ORDER — SODIUM BICARBONATE 8.4 % IV SOLN
INTRAVENOUS | Status: DC
  Filled 2022-03-29 (×3): qty 1000

## 2022-03-29 MED ORDER — ROSUVASTATIN CALCIUM 20 MG PO TABS
40.0000 mg | ORAL_TABLET | Freq: Every day | ORAL | Status: DC
  Administered 2022-03-30 – 2022-04-02 (×4): 40 mg via ORAL
  Filled 2022-03-29 (×4): qty 2

## 2022-03-29 MED ORDER — SODIUM CHLORIDE 0.9 % IV BOLUS
1000.0000 mL | Freq: Once | INTRAVENOUS | Status: AC
  Administered 2022-03-29: 1000 mL via INTRAVENOUS

## 2022-03-29 MED ORDER — MORPHINE SULFATE (PF) 2 MG/ML IV SOLN
2.0000 mg | Freq: Once | INTRAVENOUS | Status: AC
  Administered 2022-03-29: 2 mg via INTRAVENOUS
  Filled 2022-03-29: qty 1

## 2022-03-29 MED ORDER — PANTOPRAZOLE SODIUM 40 MG PO TBEC
40.0000 mg | DELAYED_RELEASE_TABLET | Freq: Every day | ORAL | Status: DC
  Administered 2022-03-30 – 2022-04-02 (×4): 40 mg via ORAL
  Filled 2022-03-29 (×4): qty 1

## 2022-03-29 MED ORDER — SODIUM BICARBONATE 8.4 % IV SOLN
INTRAVENOUS | Status: AC
  Filled 2022-03-29: qty 150

## 2022-03-29 MED ORDER — CARVEDILOL 6.25 MG PO TABS
6.2500 mg | ORAL_TABLET | Freq: Two times a day (BID) | ORAL | Status: DC
  Administered 2022-03-30 – 2022-03-31 (×3): 6.25 mg via ORAL
  Filled 2022-03-29 (×3): qty 1

## 2022-03-29 MED ORDER — DEXTROSE 5 % IV SOLN: INTRAVENOUS | Status: DC

## 2022-03-29 MED ORDER — ONDANSETRON HCL 4 MG/2ML IJ SOLN
4.0000 mg | Freq: Once | INTRAMUSCULAR | Status: AC
  Administered 2022-03-29: 4 mg via INTRAVENOUS
  Filled 2022-03-29: qty 2

## 2022-03-29 MED ORDER — SODIUM BICARBONATE 8.4 % IV SOLN: 50.0000 meq | Freq: Once | INTRAVENOUS | Status: DC

## 2022-03-29 MED ORDER — ONDANSETRON HCL 4 MG/2ML IJ SOLN: 4.0000 mg | Freq: Four times a day (QID) | INTRAMUSCULAR | Status: DC | PRN

## 2022-03-29 MED ORDER — CLOPIDOGREL BISULFATE 75 MG PO TABS
75.0000 mg | ORAL_TABLET | Freq: Every day | ORAL | Status: DC
  Administered 2022-03-30 – 2022-04-02 (×4): 75 mg via ORAL
  Filled 2022-03-29 (×4): qty 1

## 2022-03-29 MED ORDER — ONDANSETRON HCL 4 MG PO TABS: 4.0000 mg | ORAL_TABLET | Freq: Four times a day (QID) | ORAL | Status: DC | PRN

## 2022-03-29 MED ORDER — HYDROMORPHONE HCL 1 MG/ML IJ SOLN
1.0000 mg | INTRAMUSCULAR | Status: DC | PRN
  Administered 2022-03-29: 1 mg via INTRAVENOUS
  Filled 2022-03-29: qty 1

## 2022-03-29 MED ORDER — SODIUM CHLORIDE 0.9% FLUSH
3.0000 mL | Freq: Two times a day (BID) | INTRAVENOUS | Status: DC
  Administered 2022-03-29 – 2022-04-01 (×5): 3 mL via INTRAVENOUS

## 2022-03-29 MED ORDER — ACETAMINOPHEN 325 MG PO TABS: 650.0000 mg | ORAL_TABLET | Freq: Four times a day (QID) | ORAL | Status: DC | PRN

## 2022-03-29 MED ORDER — LACTATED RINGERS IV SOLN: INTRAVENOUS | Status: DC

## 2022-03-29 MED ORDER — BENZONATATE 100 MG PO CAPS
100.0000 mg | ORAL_CAPSULE | Freq: Two times a day (BID) | ORAL | Status: DC | PRN
  Administered 2022-03-29: 100 mg via ORAL
  Filled 2022-03-29: qty 1

## 2022-03-29 MED ORDER — HEPARIN SODIUM (PORCINE) 5000 UNIT/ML IJ SOLN
5000.0000 [IU] | Freq: Three times a day (TID) | INTRAMUSCULAR | Status: DC
  Administered 2022-03-29 – 2022-03-31 (×5): 5000 [IU] via SUBCUTANEOUS
  Filled 2022-03-29 (×5): qty 1

## 2022-03-29 MED ORDER — ACETAMINOPHEN 650 MG RE SUPP: 650.0000 mg | Freq: Four times a day (QID) | RECTAL | Status: DC | PRN

## 2022-03-29 NOTE — Assessment & Plan Note (Signed)
Continue Coreg.  Holding lisinopril due to ARF.

## 2022-03-29 NOTE — H&P (Signed)
History and Physical    Jacob Ali [id number] DOB: 01-10-1961 DOA: 03/29/2022  PCP: Pleas Koch, NP  Patient coming from: Home  I have personally briefly reviewed patient's old medical records in [place]  Chief Complaint: Nausea, vomiting, diarrhea  HPI: Jacob Ali is a 62 y.o. male with medical history significant for HIV well-controlled on Biktarvy, CAD s/p stent to LAD 01/2021, T2DM, HTN, and HLD who presented to the ED for evaluation of intractable nausea, vomiting, diarrhea.  Patient reports developing new onset of nausea, vomiting, and diarrhea 3 nights ago.  Initially symptoms were mild however over the last 24 hours has become more severe.  He has been having frequent watery diarrhea with some green appearance.  He has not seen any bloody diarrhea or dark tarry stools.  He has been having difficulty maintaining adequate oral intake.  He is also having significant abdominal pain more so in the lower abdominal region.  He says urine output has been diminished but has picked up this morning.  He is concerned he has norovirus as he has had it in the past.  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  He says he generally avoids NSAIDs due to history of GI bleed but did take naproxen a couple days ago.  [facility] ED Course  Labs/Imaging on admission: I have personally reviewed following labs and imaging studies.  Initial vitals showed BP 137/80, pulse 85, RR 18, temp 97.7 F, SpO2 98% on room air.  Labs showed WBC 12.2, hemoglobin 15.9, platelets 356,000, sodium 132, potassium 5.3, bicarb 13, BUN 90, creatinine 8.44 (previous BUN 13 and creatinine 0.99 on 03/01/2022), AST 31, LT 22, alk phos 35, T. bili 0.5, magnesium 2.2.  COVID, influenza, RSV PCR all negative. UA negative for UTI.  C. difficile and GI pathogen panels ordered and pending collection.  CT abdomen/pelvis without  contrast was negative for acute intra-abdominal or intrapelvic abnormalities.  Cholelithiasis without evidence of cholecystitis, descending and sigmoid diverticulosis without diverticulitis, and minimal prostatic enlargement were noted.  Kidneys, ureters, and bladder are unremarkable.  Patient was given 2 L normal saline, IV morphine, IV Zofran.  Patient was placed on IV bicarb infusion.  The hospitalist service was consulted to admit for further evaluation and management.  Review of Systems: All systems reviewed and are negative except as documented in history of present illness above.   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   Depression    GERD (gastroesophageal reflux disease)    HIV infection ([place])    Hyperlipidemia    Hypertension    Peripheral vascular disease (HCC)    PONV (postoperative nausea and vomiting)    S/P angioplasty with stent 01/30/21 to LAD  01/31/2021   Sleep apnea    Subdural hematoma 03/23/2020   Subdural hemorrhage (HCC)    Tubular adenoma of colon 11/03/2017   Colonoscopy Sept 9, 2019    Past Surgical History:  Procedure Laterality Date   CARDIAC CATHETERIZATION     CORONARY STENT INTERVENTION N/A 01/30/2021   Procedure: CORONARY STENT INTERVENTION;  Surgeon: Belva Crome, MD;  Location: [facility];  Service: Cardiovascular;  Laterality: N/A;   Examination under anesthesia, repair of anal fissure,  12/04/2000   LEFT HEART CATH AND CORONARY ANGIOGRAPHY N/A 01/30/2021   Procedure: LEFT HEART  CATH AND CORONARY ANGIOGRAPHY;  Surgeon: Belva Crome, MD;  Location: [facility];  Service: Cardiovascular;  Laterality: N/A;   PERIPHERAL VASCULAR CATHETERIZATION Left 02/06/2016   Procedure: Lower Extremity Angiography;  Surgeon: Katha Cabal, MD;  Location: [facility];  Service: Cardiovascular;  Laterality: Left;   PERIPHERAL VASCULAR CATHETERIZATION N/A  02/06/2016   Procedure: Abdominal Aortogram w/Lower Extremity;  Surgeon: Katha Cabal, MD;  Location: [facility];  Service: Cardiovascular;  Laterality: N/A;   PERIPHERAL VASCULAR CATHETERIZATION  02/06/2016   Procedure: Lower Extremity Intervention;  Surgeon: Katha Cabal, MD;  Location: [facility];  Service: Cardiovascular;;    Social History:  reports that he has been smoking cigars and cigarettes. He has a 20.00 pack-year smoking history. He has never used smokeless tobacco. He reports current alcohol use of about 2.0 standard drinks of alcohol per week. He reports that he does not currently use drugs after having used the following drugs: Marijuana.  Allergies  Allergen Reactions   Atorvastatin Other (See Comments)    myalgias   Tamiflu [Oseltamivir Phosphate] Other (See Comments)    Depressive symptoms    Family History  Problem Relation Age of Onset   Hypertension Mother    Asthma Mother    Throat cancer Father    Heart disease Father    Cancer Father        bladder   Cancer Maternal Grandmother        colon     Prior to Admission medications   Medication Sig Start Date End Date Taking? Authorizing Provider  ACCU-CHEK GUIDE test strip USE UP TO 4 TIMES A DAY AS DIRECTED 11/28/21   Pleas Koch, NP  Accu-Chek Softclix Lancets lancets USE UP TO 4 TIMES A DAY AS DIRECTED 06/21/19   Pleas Koch, NP  bictegravir-emtricitabine-tenofovir AF (BIKTARVY) 50-200-25 MG TABS tablet TAKE 1 TABLET BY MOUTH DAILY. 09/05/21 09/05/22  Campbell Riches, MD  carvedilol (COREG) 6.25 MG tablet TAKE 1 TABLET BY MOUTH 2 TIMES DAILY WITH A MEAL. 05/10/21   [name], MD  clopidogrel (PLAVIX) 75 MG tablet TAKE 1 TABLET BY MOUTH EVERY DAY 01/09/22   Kris Hartmann, NP  Cyanocobalamin (VITAMIN B 12 PO) Take 1 tablet by mouth 2 (two) times daily.    [provider]  dapagliflozin propanediol (FARXIGA) 10 MG TABS tablet Take 1 tablet (10 mg  total) by mouth daily before breakfast. 11/08/21   [name], MD  fenofibrate (TRICOR) 145 MG tablet Take 1 tablet (145 mg total) by mouth daily. 11/09/21   Marylu Lund., NP  ferrous sulfate 324 MG TBEC Take 324 mg by mouth daily with breakfast.    [provider]  fluticasone (FLONASE) 50 MCG/ACT nasal spray PLACE 1 SPRAY INTO BOTH NOSTRILS 2 (TWO) TIMES DAILY 10/01/21   Pleas Koch, NP  lisinopril (ZESTRIL) 20 MG tablet Take 1 tablet (20 mg total) by mouth daily. 05/04/21   [name], MD  metFORMIN (GLUCOPHAGE-XR) 500 MG 24 hr tablet TAKE 1 TABLET (500 MG TOTAL) BY MOUTH DAILY WITH BREAKFAST. FOR DIABETES. 03/15/22   Pleas Koch, NP  nitroGLYCERIN (NITROSTAT) 0.4 MG SL tablet PLACE 1 TABLET UNDER THE TONGUE EVERY 5 MINUTES FOR 3 DOSES AS NEEDED FOR CHEST PAIN 02/25/22   Isaiah Serge, NP  ondansetron (ZOFRAN-ODT) 8 MG disintegrating tablet Take 1 tablet (8 mg total) by mouth every 8 (eight) hours as  needed for up to 3 days for nausea or vomiting. 03/28/22 03/31/22  Betancourt, Aura Fey, NP  pantoprazole (PROTONIX) 40 MG tablet Take 1 tablet (40 mg total) by mouth daily. For heartburn. 01/16/22   Pleas Koch, NP  rosuvastatin (CRESTOR) 40 MG tablet TAKE 1 TABLET BY MOUTH EVERY DAY 07/06/21   [name], MD  traMADol (ULTRAM) 50 MG tablet Take 1 tablet (50 mg total) by mouth every 8 (eight) hours as needed for moderate pain. 03/01/22   Owens Loffler, MD    Physical Exam: Vitals:   03/29/22 2030 03/29/22 2100 03/29/22 2130 03/29/22 2228  BP: (!) 99/44 (!) 114/49 (!) 111/99 125/77  Pulse: 79 87 81 90  Resp: 13 15 15 18   Temp:   98 F (36.7 C) 98.7 F (37.1 C)  TempSrc:   Oral Oral  SpO2: 96% 97% 98% 97%   Constitutional: Resting in bed, NAD, calm, comfortable Eyes: EOMI, lids and conjunctivae normal ENMT: Mucous membranes are dry. Posterior pharynx clear of any exudate or lesions.Normal dentition.  Neck: normal, supple, no  masses. Respiratory: clear to auscultation bilaterally, no wheezing, no crackles. Normal respiratory effort. No accessory muscle use.  Cardiovascular: Regular rate and rhythm, no murmurs / rubs / gallops. No extremity edema. 2+ pedal pulses. Abdomen: Lower abdominal tenderness, no masses palpated.  Musculoskeletal: no clubbing / cyanosis. No joint deformity upper and lower extremities. Good ROM, no contractures. Normal muscle tone.  Skin: no rashes, lesions, ulcers. No induration Neurologic: Sensation intact. Strength 5/5 in all 4.  Psychiatric: Alert and oriented x 3. Normal mood.   EKG: Personally reviewed. Sinus rhythm, rate 75, no acute ischemic changes.  Assessment/Plan Principal Problem:   Acute renal failure (ARF) (HCC) Active Problems:   Nausea, vomiting, and diarrhea   HIV (human immunodeficiency virus infection) ([place])   Essential (primary) hypertension   CAD in native artery   Type 2 diabetes mellitus ([place])   Hyperlipidemia   Jacob Ali is a 62 y.o. male with medical history significant for HIV well-controlled on Biktarvy, CAD s/p stent to LAD 01/2021, T2DM, HTN, and HLD who is admitted with acute renal failure in setting of intractable nausea, vomiting, diarrhea.  Assessment and Plan: * Acute renal failure (ARF) (HCC) BUN 90 and creatinine 8.44 on admission compared to previous BUN 13/creatinine 0.99 on 03/01/22.  This is in the setting of significant volume depletion in setting of GI losses.  CT A/P negative for any acute changes.  No hydronephrosis or evidence of urinary obstruction. -Continue IV bicarb infusion -Monitor strict UOP -Hold lisinopril, Farxiga, metformin; avoid NSAIDs -Repeat labs in a.m.  Nausea, vomiting, and diarrhea Suspect infectious gastroenteritis, possibly viral.  CT A/P without any acute abnormalities. -Follow C. difficile and GI pathogen panels -Continue IV fluid hydration as above  HIV (human immunodeficiency virus infection)  ([place]) Well-controlled.  Biktarvy on hold for now due to renal dysfunction.  Essential (primary) hypertension Continue Coreg.  Holding lisinopril due to ARF.  CAD in native artery Stable, denies chest pain.  S/p stent to LAD 01/2021.  Continue Coreg, Plavix, statin.  Type 2 diabetes mellitus ([place]) Well-controlled with last A1c 4.8 on 03/01/2022.  Metformin on hold in setting of ARF.  Hyperlipidemia Continue rosuvastatin.  DVT prophylaxis: heparin injection 5,000 Units Start: 03/29/22 2345 Code Status: Full code, confirmed with patient on admission Family Communication: Discussed with patient, he has discussed with family Disposition Plan: From home and likely discharge to home pending clinical progress Consults called: Nephrology  notified prior to transfer Severity of Illness: The appropriate patient status for this patient is INPATIENT. Inpatient status is judged to be reasonable and necessary in order to provide the required intensity of service to ensure the patient's safety. The patient's presenting symptoms, physical exam findings, and initial radiographic and laboratory data in the context of their chronic comorbidities is felt to place them at high risk for further clinical deterioration. Furthermore, it is not anticipated that the patient will be medically stable for discharge from the hospital within 2 midnights of admission.   * I certify that at the point of admission it is my clinical judgment that the patient will require inpatient hospital care spanning beyond 2 midnights from the point of admission due to high intensity of service, high risk for further deterioration and high frequency of surveillance required.Zada Finders MD [facility]  If 7PM-7AM, please contact night-coverage [email or web address]  03/29/2022, 11:29 PM

## 2022-03-29 NOTE — ED Provider Notes (Signed)
[facility] Provider Note   CSN: [id number] Arrival date & time: 03/29/22  1346     History  Chief Complaint  Patient presents with   Abdominal Pain    Jacob Ali is a 62 y.o. male, history of diabetes, CAD, who presents to the ED secondary to intractable diarrhea for the last 3 days, states that it occurred 3 days ago when he started having vomiting and diarrhea that were uncontrollable, states he vomited about 10 times that day, and then 10+ episodes of diarrhea.  He states diarrhea is watery and loose, and foul-smelling.  Notes that the vomiting dissipated after 1 day, but the diarrhea has been persistent.  Has not been able to eat or drink hardly anything secondary to as soon as he eats or drinks it goes right of him.  Denies any blood in his stool.  Denies any sick contacts.  Reports that he has diffuse abdominal pain, and but has been able to urinate.  Urinated once this morning.  Endorses passing gas.    Home Medications Prior to Admission medications   Medication Sig Start Date End Date Taking? Authorizing Provider  ACCU-CHEK GUIDE test strip USE UP TO 4 TIMES A DAY AS DIRECTED 11/28/21   Pleas Koch, NP  Accu-Chek Softclix Lancets lancets USE UP TO 4 TIMES A DAY AS DIRECTED 06/21/19   Pleas Koch, NP  bictegravir-emtricitabine-tenofovir AF (BIKTARVY) 50-200-25 MG TABS tablet TAKE 1 TABLET BY MOUTH DAILY. 09/05/21 09/05/22  Campbell Riches, MD  carvedilol (COREG) 6.25 MG tablet TAKE 1 TABLET BY MOUTH 2 TIMES DAILY WITH A MEAL. 05/10/21   [name], MD  clopidogrel (PLAVIX) 75 MG tablet TAKE 1 TABLET BY MOUTH EVERY DAY 01/09/22   Kris Hartmann, NP  Cyanocobalamin (VITAMIN B 12 PO) Take 1 tablet by mouth 2 (two) times daily.    [provider]  dapagliflozin propanediol (FARXIGA) 10 MG TABS tablet Take 1 tablet (10 mg total) by mouth daily before breakfast. 11/08/21   [name], MD   fenofibrate (TRICOR) 145 MG tablet Take 1 tablet (145 mg total) by mouth daily. 11/09/21   Marylu Lund., NP  ferrous sulfate 324 MG TBEC Take 324 mg by mouth daily with breakfast.    [provider]  fluticasone (FLONASE) 50 MCG/ACT nasal spray PLACE 1 SPRAY INTO BOTH NOSTRILS 2 (TWO) TIMES DAILY 10/01/21   Pleas Koch, NP  lisinopril (ZESTRIL) 20 MG tablet Take 1 tablet (20 mg total) by mouth daily. 05/04/21   [name], MD  metFORMIN (GLUCOPHAGE-XR) 500 MG 24 hr tablet TAKE 1 TABLET (500 MG TOTAL) BY MOUTH DAILY WITH BREAKFAST. FOR DIABETES. 03/15/22   Pleas Koch, NP  nitroGLYCERIN (NITROSTAT) 0.4 MG SL tablet PLACE 1 TABLET UNDER THE TONGUE EVERY 5 MINUTES FOR 3 DOSES AS NEEDED FOR CHEST PAIN 02/25/22   Isaiah Serge, NP  ondansetron (ZOFRAN-ODT) 8 MG disintegrating tablet Take 1 tablet (8 mg total) by mouth every 8 (eight) hours as needed for up to 3 days for nausea or vomiting. 03/28/22 03/31/22  Betancourt, Aura Fey, NP  pantoprazole (PROTONIX) 40 MG tablet Take 1 tablet (40 mg total) by mouth daily. For heartburn. 01/16/22   Pleas Koch, NP  rosuvastatin (CRESTOR) 40 MG tablet TAKE 1 TABLET BY MOUTH EVERY DAY 07/06/21   [name], MD  traMADol (ULTRAM) 50 MG tablet Take 1 tablet (50 mg total) by mouth  every 8 (eight) hours as needed for moderate pain. 03/01/22   Copland, Frederico Hamman, MD      Allergies    Atorvastatin and Tamiflu [oseltamivir phosphate]    Review of Systems   Review of Systems  Constitutional:  Negative for fever.  Gastrointestinal:  Positive for abdominal pain, diarrhea, nausea and vomiting.    Physical Exam Updated Vital Signs BP (!) 122/49   Pulse 76   Temp 97.7 F (36.5 C) (Oral)   Resp 17   SpO2 99%  Physical Exam Vitals and nursing note reviewed.  Constitutional:      General: He is not in acute distress.    Appearance: He is well-developed. He is ill-appearing.  HENT:     Head: Normocephalic and atraumatic.   Eyes:     Conjunctiva/sclera: Conjunctivae normal.  Cardiovascular:     Rate and Rhythm: Normal rate and regular rhythm.     Heart sounds: No murmur heard. Pulmonary:     Effort: Pulmonary effort is normal. No respiratory distress.     Breath sounds: Normal breath sounds.  Abdominal:     Palpations: Abdomen is soft.     Tenderness: There is generalized abdominal tenderness. There is guarding.  Musculoskeletal:        General: No swelling.     Cervical back: Neck supple.  Skin:    General: Skin is warm and dry.     Capillary Refill: Capillary refill takes less than 2 seconds.  Neurological:     Mental Status: He is alert.  Psychiatric:        Mood and Affect: Mood normal.     ED Results / Procedures / Treatments   Labs (all labs ordered are listed, but only abnormal results are displayed) Labs Reviewed  CBC - Abnormal; Notable for the following components:      Result Value   WBC 12.2 (*)    MCV 101.5 (*)    All other components within normal limits  COMPREHENSIVE METABOLIC PANEL - Abnormal; Notable for the following components:   Sodium 132 (*)    Potassium 5.3 (*)    Chloride 96 (*)    CO2 13 (*)    Glucose, Bld 115 (*)    BUN 90 (*)    Creatinine, Ser 8.44 (*)    Calcium 8.6 (*)    Alkaline Phosphatase 35 (*)    GFR, Estimated 7 (*)    Anion gap 23 (*)    All other components within normal limits  GASTROINTESTINAL PANEL BY PCR, STOOL (REPLACES STOOL CULTURE)  C DIFFICILE QUICK SCREEN W PCR REFLEX    RESP PANEL BY RT-PCR (RSV, FLU A&B, COVID)  [id number]  MAGNESIUM    EKG EKG Interpretation  Date/Time:  Friday March 29 2022 15:01:46 EDT Ventricular Rate:  75 PR Interval:  146 QRS Duration: 99 QT Interval:  381 QTC Calculation: 426 R Axis:   18 Text Interpretation: Sinus rhythm Nonspecific T abnormalities, lateral leads No significant change since last tracing Confirmed by Deno Etienne [phone]) on 03/29/2022 3:03:39 PM  Radiology CT ABDOMEN PELVIS WO  CONTRAST  Result Date: 03/29/2022 CLINICAL DATA:  Abdominal pain worse today, diagnosed with norovirus, nausea, vomiting, diarrhea EXAM: CT ABDOMEN AND PELVIS WITHOUT CONTRAST TECHNIQUE: Multidetector CT imaging of the abdomen and pelvis was performed following the standard protocol without IV contrast. RADIATION DOSE REDUCTION: This exam was performed according to the departmental dose-optimization program which includes automated exposure control, adjustment of the mA and/or kV according to patient  size and/or use of iterative reconstruction technique. COMPARISON:  Of 09/2017 FINDINGS: Lower chest: Lung bases clear Hepatobiliary: Dependent calcified gallstones within gallbladder. No gallbladder wall thickening or biliary dilatation. Liver unremarkable. Pancreas: Normal appearance Spleen: Normal appearance.  Lev Cervone splenule anterior to spleen. Adrenals/Urinary Tract: Adrenal glands normal appearance. Kidneys, ureters, and bladder unremarkable. Stomach/Bowel: Normal appendix. Descending and sigmoid diverticulosis. Stomach decompressed. Bowel loops otherwise unremarkable. Vascular/Lymphatic: Atherosclerotic calcifications aorta and iliac arteries without aneurysm. No adenopathy. Reproductive: Minimal prostatic enlargement. Other: No free air or free fluid. No definite hernia or inflammatory process. Musculoskeletal: Unremarkable IMPRESSION: Cholelithiasis without evidence of cholecystitis. Descending and sigmoid diverticulosis without evidence of diverticulitis. Minimal prostatic enlargement. No acute intra-abdominal or intrapelvic abnormalities. Aortic Atherosclerosis (ICD10-I70.0). Electronically Signed   By: Lavonia Dana M.D.   On: 03/29/2022 15:32    Procedures Procedures    Medications Ordered in ED Medications  sodium bicarbonate 150 mEq in dextrose 5 % 1,150 mL infusion (has no administration in time range)  sodium chloride 0.9 % bolus 1,000 mL (0 mLs Intravenous Stopped 03/29/22 1624)  morphine (PF)  2 MG/ML injection 2 mg (2 mg Intravenous Given 03/29/22 1441)  ondansetron (ZOFRAN) injection 4 mg (4 mg Intravenous Given 03/29/22 1441)  sodium chloride 0.9 % bolus 1,000 mL (0 mLs Intravenous Stopped 03/29/22 1624)    ED Course/ Medical Decision Making/ A&P                             Medical Decision Making Patient is a 62 year old male, here for nausea, vomiting, diarrhea for the last 3 days.  States he has been unable to control his stools secondary to frequency and urgency of stools.  Denies any blood in the stool.  Also here for abdominal pain, that has been going on since the retching diarrhea has gotten worse.  We will obtain a CT abdomen pelvis, without contrast, to evaluate for possible etiology, as well as toxic medic megacolon, and obtain basic lab work including CBC, CMP, for further evaluation.  Patient does state that he has been making urine.  Amount and/or Complexity of Data Reviewed Labs: ordered.    Details: K of 5.3, elevated BUN of 90, creatinine of 8+ Radiology: ordered.    Details: CT abdomen pelvis unremarkable ECG/medicine tests:  Decision-making details documented in ED Course. Discussion of management or test interpretation with external provider(s): Patient is a 62 year old male, history of diabetes, here for intractable nausea, vomiting, diarrhea, has been having 10+ stools a day, CT abdomen pelvis unremarkable, however highly concentrated urine, creatinine of 8.4, at 8 times his normal shown 4 weeks ago.  Concern for acute renal failure, given 2 L of IV fluids as well as maintenance fluids in ED.  EKG obtained to rule out any kind of signs of hyperkalemia on EKG.  Spoke with hospitalist, Dr. Laurena Bering, he recommends IVF w/bicarb to help with hyperkalemia and acidosis. Admitted to PCU.   CRITICAL CARE Performed by: Osvaldo Shipper   Total critical care time: 30 minutes  Critical care time was exclusive of separately billable procedures and treating other  patients.  Critical care was necessary to treat or prevent imminent or life-threatening deterioration.  Critical care was time spent personally by me on the following activities: development of treatment plan with patient and/or surrogate as well as nursing, discussions with consultants, evaluation of patient's response to treatment, examination of patient, obtaining history from patient or surrogate, ordering and performing treatments and  interventions, ordering and review of laboratory studies, ordering and review of radiographic studies, pulse oximetry and re-evaluation of patient's condition.   Risk Prescription drug management. Decision regarding hospitalization.    Final Clinical Impression(s) / ED Diagnoses Final diagnoses:  Acute renal failure, unspecified acute renal failure type ([place])  Gastroenteritis  Hyperkalemia    Rx / DC Orders ED Discharge Orders     None         Leronda Lewers, Si Gaul, PA 03/29/22 1640    Lennice Sites, DO 03/29/22 1641

## 2022-03-29 NOTE — Assessment & Plan Note (Signed)
Well-controlled.  Biktarvy on hold for now due to renal dysfunction.

## 2022-03-29 NOTE — Assessment & Plan Note (Signed)
Suspect infectious gastroenteritis, possibly viral.  CT A/P without any acute abnormalities. -Follow C. difficile and GI pathogen panels -Continue IV fluid hydration as above

## 2022-03-29 NOTE — Assessment & Plan Note (Signed)
BUN 90 and creatinine 8.44 on admission compared to previous BUN 13/creatinine 0.99 on 03/01/22.  This is in the setting of significant volume depletion in setting of GI losses.  CT A/P negative for any acute changes.  No hydronephrosis or evidence of urinary obstruction. -Continue IV bicarb infusion -Monitor strict UOP -Hold lisinopril, Farxiga, metformin; avoid NSAIDs -Repeat labs in a.m.

## 2022-03-29 NOTE — Assessment & Plan Note (Signed)
Well-controlled with last A1c 4.8 on 03/01/2022.  Metformin on hold in setting of ARF.

## 2022-03-29 NOTE — ED Triage Notes (Signed)
Pt reports abdominal pain that is worse today. Pt reports he was dx with norovirus and reports N/V/D.

## 2022-03-29 NOTE — Assessment & Plan Note (Signed)
Continue rosuvastatin.  

## 2022-03-29 NOTE — Hospital Course (Signed)
Jacob Ali is a 62 y.o. male with medical history significant for HIV well-controlled on Biktarvy, CAD s/p stent to LAD 01/2021, T2DM, HTN, and HLD who is admitted with acute renal failure in setting of intractable nausea, vomiting, diarrhea.

## 2022-03-29 NOTE — Assessment & Plan Note (Signed)
Stable, denies chest pain.  S/p stent to LAD 01/2021.  Continue Coreg, Plavix, statin.

## 2022-03-29 NOTE — Telephone Encounter (Unsigned)
Spoke with patient via telephone stated having some belly pain.  Last vomiting/diarrhea yesterday 0200.  Zofran not helping with belly pain "stabbing".  Crackers does help.  Discussed with patient may have ulcer.  He asked if he can take second dose of protonix 40mg .  Discussed according to epocrates may do short term use for 10 days or less  Patient denied bright red or black blood in emesis or stools.  Reported dry mouth.  Low appetite  Taking tylenol for pain.  Eating crackers helps the pain the most.  Has liquids at bedside but not drinking often.  Discussed if worsening abdomen pain and not improved with tylenol/crackers he should be seen by a provider.  His facility of choice [facility].  Discussed with patient that is a good choice if belly pain worsening.  Encouraged patient to hydrate and eat bland soft diet today.  Encouraged water/gatorade/powerade/soup broths/noodles/applesauce/bananas/toast/crackers. Exitcare handout on abdomen pain sent to patient.  Encouraged patient to hydrate with electrolytes due to dry mouth discussed this is symptom of dehydration. Patient agreed with plan of care and had no further questions at this time. Patient A&Ox3 spoke full sentences without difficulty.  No audible cough/congestion/throat clearing during 7 minute call.

## 2022-03-30 DIAGNOSIS — B20 Human immunodeficiency virus [HIV] disease: Secondary | ICD-10-CM

## 2022-03-30 DIAGNOSIS — R112 Nausea with vomiting, unspecified: Secondary | ICD-10-CM

## 2022-03-30 DIAGNOSIS — R197 Diarrhea, unspecified: Secondary | ICD-10-CM

## 2022-03-30 DIAGNOSIS — I2510 Atherosclerotic heart disease of native coronary artery without angina pectoris: Secondary | ICD-10-CM

## 2022-03-30 DIAGNOSIS — N179 Acute kidney failure, unspecified: Secondary | ICD-10-CM | POA: Diagnosis not present

## 2022-03-30 LAB — CBC
HCT: 39 % (ref 39.0–52.0)
Hemoglobin: 13.1 g/dL (ref 13.0–17.0)
MCH: 33.8 pg (ref 26.0–34.0)
MCHC: 33.6 g/dL (ref 30.0–36.0)
MCV: 100.5 fL — ABNORMAL HIGH (ref 80.0–100.0)
Platelets: 219 10*3/uL (ref 150–400)
RBC: 3.88 MIL/uL — ABNORMAL LOW (ref 4.22–5.81)
RDW: 12.9 % (ref 11.5–15.5)
WBC: 8.8 10*3/uL (ref 4.0–10.5)
nRBC: 0 % (ref 0.0–0.2)

## 2022-03-30 LAB — BASIC METABOLIC PANEL
Anion gap: 13 (ref 5–15)
BUN: 77 mg/dL — ABNORMAL HIGH (ref 8–23)
CO2: 18 mmol/L — ABNORMAL LOW (ref 22–32)
Calcium: 7.3 mg/dL — ABNORMAL LOW (ref 8.9–10.3)
Chloride: 102 mmol/L (ref 98–111)
Creatinine, Ser: 4.71 mg/dL — ABNORMAL HIGH (ref 0.61–1.24)
GFR, Estimated: 13 mL/min — ABNORMAL LOW (ref >60)
Glucose, Bld: 103 mg/dL — ABNORMAL HIGH (ref 70–99)
Potassium: 3.3 mmol/L — ABNORMAL LOW (ref 3.5–5.1)
Sodium: 133 mmol/L — ABNORMAL LOW (ref 135–145)

## 2022-03-30 LAB — PROTEIN / CREATININE RATIO, URINE
Creatinine, Urine: 112 mg/dL
Protein Creatinine Ratio: 0.34 mg/mg{Cre} — ABNORMAL HIGH (ref 0.00–0.15)
Total Protein, Urine: 38 mg/dL

## 2022-03-30 LAB — C DIFFICILE QUICK SCREEN W PCR REFLEX
C Diff antigen: NEGATIVE
C Diff interpretation: NOT DETECTED
C Diff toxin: NEGATIVE

## 2022-03-30 LAB — SODIUM, URINE, RANDOM: Sodium, Ur: 38 mmol/L

## 2022-03-30 LAB — GLUCOSE, CAPILLARY
Glucose-Capillary: 122 mg/dL — ABNORMAL HIGH (ref 70–99)
Glucose-Capillary: 98 mg/dL (ref 70–99)
Glucose-Capillary: 110 mg/dL — ABNORMAL HIGH (ref 70–99)

## 2022-03-30 LAB — CREATININE, URINE, RANDOM: Creatinine, Urine: 122 mg/dL

## 2022-03-30 MED ORDER — MELATONIN 3 MG PO TABS
3.0000 mg | ORAL_TABLET | Freq: Every evening | ORAL | Status: DC | PRN
  Administered 2022-03-30 – 2022-04-01 (×3): 3 mg via ORAL
  Filled 2022-03-30 (×3): qty 1

## 2022-03-30 MED ORDER — LOPERAMIDE HCL 2 MG PO CAPS
2.0000 mg | ORAL_CAPSULE | ORAL | Status: DC | PRN
  Administered 2022-03-30: 2 mg via ORAL
  Filled 2022-03-30: qty 1

## 2022-03-30 MED ORDER — INSULIN ASPART 100 UNIT/ML IJ SOLN
0.0000 [IU] | Freq: Three times a day (TID) | INTRAMUSCULAR | Status: DC
  Administered 2022-03-30: 1 [IU] via SUBCUTANEOUS
  Administered 2022-03-31: 2 [IU] via SUBCUTANEOUS
  Administered 2022-03-31: 1 [IU] via SUBCUTANEOUS
  Administered 2022-04-02: 3 [IU] via SUBCUTANEOUS

## 2022-03-30 MED ORDER — POTASSIUM CHLORIDE CRYS ER 20 MEQ PO TBCR
40.0000 meq | EXTENDED_RELEASE_TABLET | ORAL | Status: AC
  Administered 2022-03-30 (×2): 40 meq via ORAL
  Filled 2022-03-30 (×2): qty 2

## 2022-03-30 MED ORDER — LACTATED RINGERS IV SOLN: INTRAVENOUS | Status: DC

## 2022-03-30 NOTE — Telephone Encounter (Signed)
Noted patient went to ED and was admitted yesterday.  Epic reviewed.  Spoke with patient via telephone he stated his sister had to drive him to ER as no ambulances available.  He was diagnosed with elevated creatinine and electrolytes were low.  He needed morphine to control his abdomen pain. He stated has received IV fluids and labs improving but still having diarrhea today.  Not liking the meals (low sodium) that he is being served.  Woke up hungry finally today.  Has tolerated yogurt and some snacks but has diarrhea immediately after eating  Gave stool sample today for testing has not heard what results were.    Discussed with patient results still pending.  Discussed current and historical serum labs with patient and CT scan results showed diverticulosis, cholelithiasis, CAD and prostate enlarged.Marland Kitchen  Answered patient questions regarding labs he has had two prior elevated creatinine levels in 2017 (influenza), 2023 (CAD/stent placed) and this hospitalization.  Discussed avoid NSAIDS and dehydration in the future.  Patient asked if this hospitalization was related to viral illness and I told him we need the stool test results. Discussed calcium level still low and hydration ongoing.  Encouraged patient to eat meals as tolerated. Discussed his vital signs on admission did not show low BP or rapid heart rate typically seen with severe dehydration. Discussed I would call patient to follow up again tomorrow.  Duration of call 18 minutes Patient A&Ox3 spoke full sentences without difficulty; no cough/nasal congestion/throat clearing audible during call.  Patient agreed with plan of care and had no further questions at this time.   Latest Reference Range & Units 03/29/22 14:11 03/30/22 03:37  COMPREHENSIVE METABOLIC PANEL  Rpt !   Sodium 135 - 145 mmol/L 132 (L) 133 (L)  Potassium 3.5 - 5.1 mmol/L 5.3 (H) 3.3 (L)  Chloride 98 - 111 mmol/L 96 (L) 102  CO2 22 - 32 mmol/L 13 (L) 18 (L)  Glucose 70 - 99 mg/dL 115 (H)  103 (H)  BUN 8 - 23 mg/dL 90 (H) 77 (H)  Creatinine 0.61 - 1.24 mg/dL 8.44 (H) 4.71 (H)  Calcium 8.9 - 10.3 mg/dL 8.6 (L) 7.3 (L)  Anion gap 5 - 15  23 (H) 13  Magnesium 1.7 - 2.4 mg/dL 2.2   Alkaline Phosphatase 38 - 126 U/L 35 (L)   Albumin 3.5 - 5.0 g/dL 4.7   AST 15 - 41 U/L 31   ALT 0 - 44 U/L 22   Total Protein 6.5 - 8.1 g/dL 8.1   Total Bilirubin 0.3 - 1.2 mg/dL 0.5   GFR, Estimated >60 mL/min 7 (L) 13 (L)  !: Data is abnormal (L): Data is abnormally low (H): Data is abnormally high Rpt: View report in Results Review for more information  Latest Reference Range & Units 03/29/22 14:11 03/30/22 03:37  WBC 4.0 - 10.5 K/uL 12.2 (H) 8.8  RBC 4.22 - 5.81 MIL/uL 4.74 3.88 (L)  Hemoglobin 13.0 - 17.0 g/dL 15.9 13.1  HCT 39.0 - 52.0 % 48.1 39.0  MCV 80.0 - 100.0 fL 101.5 (H) 100.5 (H)  MCH 26.0 - 34.0 pg 33.5 33.8  MCHC 30.0 - 36.0 g/dL 33.1 33.6  RDW 11.5 - 15.5 % 13.0 12.9  Platelets 150 - 400 K/uL 356 219  nRBC 0.0 - 0.2 % 0.0 0.0  (H): Data is abnormally high (L): Data is abnormally low   Latest Reference Range & Units 03/29/22 17:10  RESP PANEL BY RT-PCR (RSV, FLU A&B, COVID)  [id number]  Rpt  Influenza A By PCR NEGATIVE  NEGATIVE  Influenza B By PCR NEGATIVE  NEGATIVE  Respiratory Syncytial Virus by PCR NEGATIVE  NEGATIVE  SARS Coronavirus 2 by RT PCR NEGATIVE  NEGATIVE  Rpt: View report in Results Review for more information  Latest Reference Range & Units 03/29/22 19:34  URINALYSIS, ROUTINE W REFLEX MICROSCOPIC  Rpt !  Appearance CLEAR  CLEAR  Bilirubin Urine NEGATIVE  NEGATIVE  Color, Urine YELLOW  YELLOW  Glucose, UA NEGATIVE mg/dL 100 !  Hgb urine dipstick NEGATIVE  SMALL !  Ketones, ur NEGATIVE mg/dL NEGATIVE  Leukocytes,Ua NEGATIVE  NEGATIVE  Nitrite NEGATIVE  NEGATIVE  pH 5.0 - 8.0  5.0  Protein NEGATIVE mg/dL 30 !  Specific Gravity, Urine 1.005 - 1.030  1.018  Bacteria, UA NONE SEEN  NONE SEEN  Granular Casts, UA  PRESENT  Hyaline Casts, UA  PRESENT   Mucus  PRESENT  RBC / HPF 0 - 5 RBC/hpf 0-5  Squamous Epithelial / HPF 0 - 5 /HPF 0-5  WBC, UA 0 - 5 WBC/hpf 0-5  !: Data is abnormal Rpt: View report in Results Review for more information  CLINICAL DATA:  Abdominal pain worse today, diagnosed with norovirus, nausea, vomiting, diarrhea   EXAM: CT ABDOMEN AND PELVIS WITHOUT CONTRAST   TECHNIQUE: Multidetector CT imaging of the abdomen and pelvis was performed following the standard protocol without IV contrast.   RADIATION DOSE REDUCTION: This exam was performed according to the departmental dose-optimization program which includes automated exposure control, adjustment of the mA and/or kV according to patient size and/or use of iterative reconstruction technique.   COMPARISON:  Of 09/2017   FINDINGS: Lower chest: Lung bases clear   Hepatobiliary: Dependent calcified gallstones within gallbladder. No gallbladder wall thickening or biliary dilatation. Liver unremarkable.   Pancreas: Normal appearance   Spleen: Normal appearance.  Small splenule anterior to spleen.   Adrenals/Urinary Tract: Adrenal glands normal appearance. Kidneys, ureters, and bladder unremarkable.   Stomach/Bowel: Normal appendix. Descending and sigmoid diverticulosis. Stomach decompressed. Bowel loops otherwise unremarkable.   Vascular/Lymphatic: Atherosclerotic calcifications aorta and iliac arteries without aneurysm. No adenopathy.   Reproductive: Minimal prostatic enlargement.   Other: No free air or free fluid. No definite hernia or inflammatory process.   Musculoskeletal: Unremarkable   IMPRESSION: Cholelithiasis without evidence of cholecystitis.   Descending and sigmoid diverticulosis without evidence of diverticulitis.   Minimal prostatic enlargement.   No acute intra-abdominal or intrapelvic abnormalities.   Aortic Atherosclerosis (ICD10-I70.0).     Electronically Signed   By: Lavonia Dana M.D.   On: 03/29/2022 15:32

## 2022-03-30 NOTE — Consult Note (Signed)
[facility] Renal Consultation Note  Requesting MD: Oren Binet, MD  Indication for Consultation:  AKI   Chief complaint: prolonged nausea/vomiting/diarrhea  HPI:  Jacob Ali is a 62 y.o. male with a history of hypertension, coronary artery disease, HIV infection, and diabetes who presented to the hospital with abdominal pain.  He states that several people at work have been out with norovirus and though he wasn't tested for it he's had it before and that's what he thinks he has.  He had terrible nausea/vomiting and diarrhea Wednesday and had actually called EMS to his home Wednesday for same.  They evaluated him and recommended that he remain at home as vitals were stable.  Diarrhea, nausea, and vomiting resolved as of Thursday AM.  He states that he had limited urine output at home.  No pre-syncopal events prior to admission (but did have some presyncopal sensation last night.)  He started having diarrhea again this AM.  He is on lisinopril and farxiga and metformin.  He states that he had been taking aleve twice a day until a few months ago and stopped at that time secondary to dark stool.  He then stated that he took some aleve recently when he broke his ribs but just took a few doses; when I asked him when this occurred and how long he was on aleve before he became frustrated and stated that the examiner wasn't listening and that he was no longer taking aleve.  He was found to have acute kidney injury with creatinine 8.44 as below.  He was started on bicarb gtt.  Nephrology was consulted for assistance with management of AKI.  His renal function is improving thankfully.  He was transitioned to [facility] today.  He had a CT a/p with cholelithiasis without cholecystitis and diverticulosis without diverticulitis.  There was no acute process noted.  He did have mild prostatic enlargement with kidneys and ureters unremarkable.  He states they scanned his bladder before he urinated and told  him when he came in that there was nothing in his bladder.  He had 1.2 liters UOP over 3/15 that was chartd.    Creat  Date/Time Value Ref Range Status  03/01/2022 02:49 PM 0.99 0.70 - 1.35 mg/dL Final   Creatinine, Ser  Date/Time Value Ref Range Status  03/30/2022 03:37 AM 4.71 (H) 0.61 - 1.24 mg/dL Final  03/29/2022 02:11 PM 8.44 (H) 0.61 - 1.24 mg/dL Final  11/07/2021 03:54 PM 0.90 0.76 - 1.27 mg/dL Final  05/01/2021 07:41 AM 1.09 0.76 - 1.27 mg/dL Final  04/24/2021 07:27 AM 1.37 (H) 0.76 - 1.27 mg/dL Final  03/29/2021 02:50 PM 1.18 0.76 - 1.27 mg/dL Final  01/31/2021 12:55 AM 0.91 0.61 - 1.24 mg/dL Final  01/30/2021 08:40 PM 0.97 0.61 - 1.24 mg/dL Final  01/30/2021 04:03 PM 0.86 0.61 - 1.24 mg/dL Final  01/30/2021 07:10 AM 0.93 0.61 - 1.24 mg/dL Final  06/26/2020 09:35 AM 1.01 0.76 - 1.27 mg/dL Final  03/16/2020 12:37 PM 0.99 0.61 - 1.24 mg/dL Final  05/19/2019 07:51 AM 1.01 0.40 - 1.50 mg/dL Final  05/22/2017 12:22 PM 1.13 0.61 - 1.24 mg/dL Final  06/06/2016 09:41 AM 1.01 0.76 - 1.27 mg/dL Final  02/06/2016 07:51 AM 1.00 0.61 - 1.24 mg/dL Final  06/05/2015 09:40 AM 0.88 0.76 - 1.27 mg/dL Final  05/05/2015 03:25 PM 1.09 0.76 - 1.27 mg/dL Final  04/08/2015 05:16 AM 1.14 0.61 - 1.24 mg/dL Final  04/07/2015 09:30 AM 2.20 (H) 0.61 - 1.24 mg/dL  Final  02/24/2011 05:25 AM 0.80 0.50 - 1.35 mg/dL Final  02/23/2011 05:10 AM 0.78 0.50 - 1.35 mg/dL Final  02/22/2011 06:45 PM 0.78 0.50 - 1.35 mg/dL Final  02/22/2011 01:20 AM 0.67 0.50 - 1.35 mg/dL Final  02/21/2011 06:46 PM 0.92 0.50 - 1.35 mg/dL Final  02/21/2011 02:35 AM 0.77 0.50 - 1.35 mg/dL Final    Comment:    RESULT REPEATED AND VERIFIED DELTA CHECK NOTED  02/20/2011 11:58 AM 1.20 0.50 - 1.35 mg/dL Final     PMHx:   Past Medical History:  Diagnosis Date   CAD in native artery 01/31/2021   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   Depression    GERD (gastroesophageal reflux disease)    HIV infection (HCC)     Hyperlipidemia    Hypertension    Peripheral vascular disease (HCC)    PONV (postoperative nausea and vomiting)    S/P angioplasty with stent 01/30/21 to LAD  01/31/2021   Sleep apnea    Subdural hematoma 03/23/2020   Subdural hemorrhage (HCC)    Tubular adenoma of colon 11/03/2017   Colonoscopy Sept 9, 2019    Past Surgical History:  Procedure Laterality Date   CARDIAC CATHETERIZATION     CORONARY STENT INTERVENTION N/A 01/30/2021   Procedure: CORONARY STENT INTERVENTION;  Surgeon: Belva Crome, MD;  Location: [place] [facility];  Service: Cardiovascular;  Laterality: N/A;   Examination under anesthesia, repair of anal fissure,  12/04/2000   LEFT HEART CATH AND CORONARY ANGIOGRAPHY N/A 01/30/2021   Procedure: LEFT HEART CATH AND CORONARY ANGIOGRAPHY;  Surgeon: Belva Crome, MD;  Location: [place] [facility];  Service: Cardiovascular;  Laterality: N/A;   PERIPHERAL VASCULAR CATHETERIZATION Left 02/06/2016   Procedure: Lower Extremity Angiography;  Surgeon: Katha Cabal, MD;  Location: [place] [facility];  Service: Cardiovascular;  Laterality: Left;   PERIPHERAL VASCULAR CATHETERIZATION N/A 02/06/2016   Procedure: Abdominal Aortogram w/Lower Extremity;  Surgeon: Katha Cabal, MD;  Location: [place] [facility];  Service: Cardiovascular;  Laterality: N/A;   PERIPHERAL VASCULAR CATHETERIZATION  02/06/2016   Procedure: Lower Extremity Intervention;  Surgeon: Katha Cabal, MD;  Location: [place] [facility];  Service: Cardiovascular;;    Family Hx:  Family History  Problem Relation Age of Onset   Hypertension Mother    Asthma Mother    Throat cancer Father    Heart disease Father    Cancer Father        bladder   Cancer Maternal Grandmother        colon    Social History:  reports that he has been smoking cigars and cigarettes. He has a 20.00 pack-year smoking history. He has never used smokeless tobacco. He reports current alcohol use of about 2.0 standard  drinks of alcohol per week. He reports that he does not currently use drugs after having used the following drugs: Marijuana.  Allergies:  Allergies  Allergen Reactions   Atorvastatin Other (See Comments)    myalgias   Tamiflu [Oseltamivir Phosphate] Other (See Comments)    Depressive symptoms    Medications: Prior to Admission medications   Medication Sig Start Date End Date Taking? Authorizing Provider  ACCU-CHEK GUIDE test strip USE UP TO 4 TIMES A DAY AS DIRECTED 11/28/21   Pleas Koch, NP  Accu-Chek Softclix Lancets lancets USE UP TO 4 TIMES A DAY AS DIRECTED 06/21/19   Pleas Koch, NP  bictegravir-emtricitabine-tenofovir AF (BIKTARVY) 50-200-25 MG TABS tablet TAKE 1  TABLET BY MOUTH DAILY. 09/05/21 09/05/22  Campbell Riches, MD  carvedilol (COREG) 6.25 MG tablet TAKE 1 TABLET BY MOUTH 2 TIMES DAILY WITH A MEAL. 05/10/21   [name], MD  clopidogrel (PLAVIX) 75 MG tablet TAKE 1 TABLET BY MOUTH EVERY DAY 01/09/22   Kris Hartmann, NP  Cyanocobalamin (VITAMIN B 12 PO) Take 1 tablet by mouth 2 (two) times daily.    [provider]  dapagliflozin propanediol (FARXIGA) 10 MG TABS tablet Take 1 tablet (10 mg total) by mouth daily before breakfast. 11/08/21   [name], MD  fenofibrate (TRICOR) 145 MG tablet Take 1 tablet (145 mg total) by mouth daily. 11/09/21   Marylu Lund., NP  ferrous sulfate 324 MG TBEC Take 324 mg by mouth daily with breakfast.    [provider]  fluticasone (FLONASE) 50 MCG/ACT nasal spray PLACE 1 SPRAY INTO BOTH NOSTRILS 2 (TWO) TIMES DAILY 10/01/21   Pleas Koch, NP  lisinopril (ZESTRIL) 20 MG tablet Take 1 tablet (20 mg total) by mouth daily. 05/04/21   [name], MD  metFORMIN (GLUCOPHAGE-XR) 500 MG 24 hr tablet TAKE 1 TABLET (500 MG TOTAL) BY MOUTH DAILY WITH BREAKFAST. FOR DIABETES. 03/15/22   Pleas Koch, NP  nitroGLYCERIN (NITROSTAT) 0.4 MG SL tablet PLACE 1 TABLET UNDER THE TONGUE  EVERY 5 MINUTES FOR 3 DOSES AS NEEDED FOR CHEST PAIN 02/25/22   Isaiah Serge, NP  ondansetron (ZOFRAN-ODT) 8 MG disintegrating tablet Take 1 tablet (8 mg total) by mouth every 8 (eight) hours as needed for up to 3 days for nausea or vomiting. 03/28/22 03/31/22  Betancourt, Aura Fey, NP  pantoprazole (PROTONIX) 40 MG tablet Take 1 tablet (40 mg total) by mouth daily. For heartburn. 01/16/22   Pleas Koch, NP  rosuvastatin (CRESTOR) 40 MG tablet TAKE 1 TABLET BY MOUTH EVERY DAY 07/06/21   [name], MD  traMADol (ULTRAM) 50 MG tablet Take 1 tablet (50 mg total) by mouth every 8 (eight) hours as needed for moderate pain. 03/01/22   Copland, Frederico Hamman, MD    I have reviewed the patient's current and reported prior to admission medications.  Labs:     Latest Ref Rng & Units 03/30/2022    3:37 AM 03/29/2022    2:11 PM 03/01/2022    2:49 PM  BMP  Glucose 70 - 99 mg/dL 103  115  98   BUN 8 - 23 mg/dL 77  90  13   Creatinine 0.61 - 1.24 mg/dL 4.71  8.44  0.99   BUN/Creat Ratio 6 - 22 (calc)   SEE NOTE:   Sodium 135 - 145 mmol/L 133  132  139   Potassium 3.5 - 5.1 mmol/L 3.3  5.3  4.2   Chloride 98 - 111 mmol/L 102  96  107   CO2 22 - 32 mmol/L 18  13  24    Calcium 8.9 - 10.3 mg/dL 7.3  8.6  9.4     Urinalysis    Component Value Date/Time   COLORURINE YELLOW 03/29/2022 1934   APPEARANCEUR CLEAR 03/29/2022 1934   LABSPEC 1.018 03/29/2022 1934   PHURINE 5.0 03/29/2022 1934   GLUCOSEU 100 (A) 03/29/2022 1934   HGBUR SMALL (A) 03/29/2022 1934   BILIRUBINUR NEGATIVE 03/29/2022 1934   BILIRUBINUR neg 09/30/2014 0910   KETONESUR NEGATIVE 03/29/2022 1934   PROTEINUR 30 (A) 03/29/2022 1934   UROBILINOGEN 0.2 09/30/2014 0910   UROBILINOGEN 0.2 05/25/2014 [id number]  NITRITE NEGATIVE 03/29/2022 1934   LEUKOCYTESUR NEGATIVE 03/29/2022 1934     ROS:  Pertinent items noted in HPI and remainder of comprehensive ROS otherwise negative.  Physical Exam: Vitals:   03/30/22 0839 03/30/22 1202   BP:  119/61  Pulse:  76  Resp:  14  Temp: 97.8 F (36.6 C) 98.9 F (37.2 C)  SpO2:  95%     General: adult male in bed in NAD   HEENT: NCAT Eyes: EOMI sclera anicteric Neck: supple trachea midline  Heart: S1S2 no rub Lungs: clear to auscultation bilaterally  Abdomen: soft/nt/nd Extremities: no edema appreciated; no cyanosis or clubbing  Skin: no rash on extremities exposed  Psych: at times frustrated  Neuro: alert and oriented x 3 provides hx and follows commands  GU: no foley   Assessment/Plan:  # AKI  - Pre-renal insults with prolonged intractable nausea/vomiting/diarrhea in the setting of lisinopril and farxiga  - renal panel in AM  - Continue LR - set at 125 mL/hr  - holding lisinopril and farxiga  - Check up/cr ratio - Would also remain off of metformin given severe AKI event  - Check post-void residual bladder scan and in/out cath if over 300 mL urine retained   # Metabolic acidosis - Secondary to AKI and in setting of metformin use and GI losses - Improving with supportive care  - Improved with bicarb gtt and now transitioned to LR  - Would remain off of metformin   # Nausea/vomiting and diarrhea - Supportive care per primary team   # HTN  - Please hold lisinopril as you are   # Hyperkalemia - Potassium initially 5.3 and improved with bicarb gtt - now on LR and tolerating with potassium 3.3 this AM   # Type 2 DM  - Stop metformin (would not resume on discharge) and hold farxiga for now  # HIV  - Regimen per primary team   Disposition per primary team   Claudia Desanctis 03/30/2022, 1:55 PM

## 2022-03-30 NOTE — Progress Notes (Signed)
PROGRESS NOTE        PATIENT DETAILS Name: Jacob Ali Age: 62 y.o. Sex: male Date of Birth: May 02, 1960 Admit Date: 03/29/2022 Admitting Physician Evalee Mutton Kristeen Mans, MD [id number], Leticia Penna, NP  Brief Summary: Patient is a 62 y.o.  male HIV on [place], CAD s/p PCI to LAD January 2023, DM-2, HTN, HLD who presented to the ED with several days history of intractable nausea/vomiting/diarrhea-he was found to have AKI with a creatinine of 8.4 and subsequently admitted to the hospitalist service.  Significant events: 3/15>> admit to [facility]  Significant studies: 3/15>> CT abdomen/pelvis: No acute intra-abdominal or intrapelvic abnormalities  Significant microbiology data: 3/15>> COVID/influenza/RSV PCR: Negative  Procedures: None  Consults: Nephrology  Subjective: Lying comfortably in bed-denies any chest pain or shortness of breath.  Diarrhea is much better-no BM overnight.  No vomiting.  Objective: Vitals: Blood pressure 121/69, pulse 75, temperature 97.8 F (36.6 C), temperature source Oral, resp. rate 15, weight 80.8 kg, SpO2 95 %.   Exam: Gen Exam:Alert awake-not in any distress HEENT:atraumatic, normocephalic Chest: B/L clear to auscultation anteriorly CVS:S1S2 regular Abdomen:soft non tender, non distended Extremities:no edema Neurology: Non focal Skin: no rash  Pertinent Labs/Radiology:    Latest Ref Rng & Units 03/30/2022    3:37 AM 03/29/2022    2:11 PM 03/01/2022    2:49 PM  CBC  WBC 4.0 - 10.5 K/uL 8.8  12.2  8.8   Hemoglobin 13.0 - 17.0 g/dL 13.1  15.9  12.7   Hematocrit 39.0 - 52.0 % 39.0  48.1  37.0   Platelets 150 - 400 K/uL 219  356  258     Lab Results  Component Value Date   NA 133 (L) 03/30/2022   K 3.3 (L) 03/30/2022   CL 102 03/30/2022   CO2 18 (L) 03/30/2022     Assessment/Plan: AKI Felt to be hemodynamically mediated in the setting of volume loss/lisinopril use Significantly improved with IVF Follow  electrolytes Continue to avoid nephrotoxic agents  Metabolic acidosis Hyperkalemia Due to AKI Resolved bicarb infusion  Nausea/vomiting/diarrhea Likely viral illness GI symptoms have essentially resolved Advance to regular diet  CAD s/p PCI to LAD January 2023 No anginal symptoms Continue Plavix/statin/Coreg  HTN BP controlled with Coreg Continue to hold lisinopril  HLD Statin  DM-2 (A1c 4.8 on 03/01/2022) CBG stable with SSI  No results for input(s): "GLUCAP" in the last 72 hours.   HIV (CD4 count 1866 on 11/07/2021) Discussed with pharmacy-hold Biktarvy until creatinine clearance >30  BMI: Estimated body mass index is 26.31 kg/m as calculated from the following:   Height as of 03/01/22: 5\' 9"  (1.753 m).   Weight as of this encounter: 80.8 kg.   Code status:   Code Status: Full Code   DVT Prophylaxis: heparin injection 5,000 Units Start: 03/29/22 2345   Family Communication: None at bedside   Disposition Plan: Status is: Inpatient Remains inpatient appropriate because: Severity of illness   Planned Discharge Destination:Home   Diet: Diet Order             Diet heart healthy/carb modified Fluid consistency: Thin  Diet effective now                     Antimicrobial agents: Anti-infectives (From admission, onward)    None        MEDICATIONS: Scheduled  Meds:  carvedilol  6.25 mg Oral BID WC   clopidogrel  75 mg Oral Daily   heparin  5,000 Units Subcutaneous Q8H   pantoprazole  40 mg Oral Daily   potassium chloride  40 mEq Oral Q4H   rosuvastatin  40 mg Oral Daily   sodium chloride flush  3 mL Intravenous Q12H   Continuous Infusions:  sodium bicarbonate 150 mEq in dextrose 5 % 1,150 mL infusion 125 mL/hr at 03/30/22 0318   PRN Meds:.acetaminophen **OR** acetaminophen, benzonatate, HYDROmorphone (DILAUDID) injection, ondansetron **OR** ondansetron (ZOFRAN) IV   I have personally reviewed following labs and imaging  studies  LABORATORY DATA: CBC: Recent Labs  Lab 03/29/22 1411 03/30/22 0337  WBC 12.2* 8.8  HGB 15.9 13.1  HCT 48.1 39.0  MCV 101.5* 100.5*  PLT 356 [id number]    Basic Metabolic Panel: Recent Labs  Lab 03/29/22 1411 03/30/22 0337  NA 132* 133*  K 5.3* 3.3*  CL 96* 102  CO2 13* 18*  GLUCOSE 115* 103*  BUN 90* 77*  CREATININE 8.44* 4.71*  CALCIUM 8.6* 7.3*  MG 2.2  --     GFR: Estimated Creatinine Clearance: 16.1 mL/min (A) (by C-G formula based on SCr of 4.71 mg/dL (H)).  Liver Function Tests: Recent Labs  Lab 03/29/22 1411  AST 31  ALT 22  ALKPHOS 35*  BILITOT 0.5  PROT 8.1  ALBUMIN 4.7   No results for input(s): "LIPASE", "AMYLASE" in the last 168 hours. No results for input(s): "AMMONIA" in the last 168 hours.  Coagulation Profile: No results for input(s): "INR", "PROTIME" in the last 168 hours.  Cardiac Enzymes: No results for input(s): "CKTOTAL", "CKMB", "CKMBINDEX", "TROPONINI" in the last 168 hours.  BNP (last 3 results) No results for input(s): "PROBNP" in the last 8760 hours.  Lipid Profile: No results for input(s): "CHOL", "HDL", "LDLCALC", "TRIG", "CHOLHDL", "LDLDIRECT" in the last 72 hours.  Thyroid Function Tests: No results for input(s): "TSH", "T4TOTAL", "FREET4", "T3FREE", "THYROIDAB" in the last 72 hours.  Anemia Panel: No results for input(s): "VITAMINB12", "FOLATE", "FERRITIN", "TIBC", "IRON", "RETICCTPCT" in the last 72 hours.  Urine analysis:    Component Value Date/Time   COLORURINE YELLOW 03/29/2022 1934   APPEARANCEUR CLEAR 03/29/2022 1934   LABSPEC 1.018 03/29/2022 1934   PHURINE 5.0 03/29/2022 1934   GLUCOSEU 100 (A) 03/29/2022 1934   HGBUR SMALL (A) 03/29/2022 1934   BILIRUBINUR NEGATIVE 03/29/2022 1934   BILIRUBINUR neg 09/30/2014 0910   KETONESUR NEGATIVE 03/29/2022 1934   PROTEINUR 30 (A) 03/29/2022 1934   UROBILINOGEN 0.2 09/30/2014 0910   UROBILINOGEN 0.2 05/25/2014 0903   NITRITE NEGATIVE 03/29/2022 1934    LEUKOCYTESUR NEGATIVE 03/29/2022 1934    Sepsis Labs: Lactic Acid, Venous No results found for: "LATICACIDVEN"  MICROBIOLOGY: Recent Results (from the past 240 hour(s))  Resp panel by RT-PCR (RSV, Flu A&B, Covid) Anterior Nasal Swab     Status: None   Collection Time: 03/29/22  5:10 PM   Specimen: Anterior Nasal Swab  Result Value Ref Range Status   SARS Coronavirus 2 by RT PCR NEGATIVE NEGATIVE Final    Comment: (NOTE) SARS-CoV-2 target nucleic acids are NOT DETECTED.  The SARS-CoV-2 RNA is generally detectable in upper respiratory specimens during the acute phase of infection. The lowest concentration of SARS-CoV-2 viral copies this assay can detect is 138 copies/mL. A negative result does not preclude SARS-Cov-2 infection and should not be used as the sole basis for treatment or other patient management decisions. A negative result  may occur with  improper specimen collection/handling, submission of specimen other than nasopharyngeal swab, presence of viral mutation(s) within the areas targeted by this assay, and inadequate number of viral copies(<138 copies/mL). A negative result must be combined with clinical observations, patient history, and epidemiological information. The expected result is Negative.  Fact Sheet for Patients:  EntrepreneurPulse.com.au  Fact Sheet for Healthcare Providers:  IncredibleEmployment.be  This test is no t yet approved or cleared by the [place] FDA and  has been authorized for detection and/or diagnosis of SARS-CoV-2 by FDA under an Emergency Use Authorization (EUA). This EUA will remain  in effect (meaning this test can be used) for the duration of the COVID-19 declaration under Section 564(b)(1) of the Act, 21 U.S.C.section 360bbb-3(b)(1), unless the authorization is terminated  or revoked sooner.       Influenza A by PCR NEGATIVE NEGATIVE Final   Influenza B by PCR NEGATIVE NEGATIVE Final     Comment: (NOTE) The Xpert Xpress SARS-CoV-2/FLU/RSV plus assay is intended as an aid in the diagnosis of influenza from Nasopharyngeal swab specimens and should not be used as a sole basis for treatment. Nasal washings and aspirates are unacceptable for Xpert Xpress SARS-CoV-2/FLU/RSV testing.  Fact Sheet for Patients: EntrepreneurPulse.com.au  Fact Sheet for Healthcare Providers: IncredibleEmployment.be  This test is not yet approved or cleared by the [place] FDA and has been authorized for detection and/or diagnosis of SARS-CoV-2 by FDA under an Emergency Use Authorization (EUA). This EUA will remain in effect (meaning this test can be used) for the duration of the COVID-19 declaration under Section 564(b)(1) of the Act, 21 U.S.C. section 360bbb-3(b)(1), unless the authorization is terminated or revoked.     Resp Syncytial Virus by PCR NEGATIVE NEGATIVE Final    Comment: (NOTE) Fact Sheet for Patients: EntrepreneurPulse.com.au  Fact Sheet for Healthcare Providers: IncredibleEmployment.be  This test is not yet approved or cleared by the [place] FDA and has been authorized for detection and/or diagnosis of SARS-CoV-2 by FDA under an Emergency Use Authorization (EUA). This EUA will remain in effect (meaning this test can be used) for the duration of the COVID-19 declaration under Section 564(b)(1) of the Act, 21 U.S.C. section 360bbb-3(b)(1), unless the authorization is terminated or revoked.  Performed at [facility], [address], [place], [place] [address]     RADIOLOGY STUDIES/RESULTS: CT ABDOMEN PELVIS WO CONTRAST  Result Date: 03/29/2022 CLINICAL DATA:  Abdominal pain worse today, diagnosed with norovirus, nausea, vomiting, diarrhea EXAM: CT ABDOMEN AND PELVIS WITHOUT CONTRAST TECHNIQUE: Multidetector CT imaging of the abdomen and pelvis was performed following  the standard protocol without IV contrast. RADIATION DOSE REDUCTION: This exam was performed according to the departmental dose-optimization program which includes automated exposure control, adjustment of the mA and/or kV according to patient size and/or use of iterative reconstruction technique. COMPARISON:  Of 09/2017 FINDINGS: Lower chest: Lung bases clear Hepatobiliary: Dependent calcified gallstones within gallbladder. No gallbladder wall thickening or biliary dilatation. Liver unremarkable. Pancreas: Normal appearance Spleen: Normal appearance.  Small splenule anterior to spleen. Adrenals/Urinary Tract: Adrenal glands normal appearance. Kidneys, ureters, and bladder unremarkable. Stomach/Bowel: Normal appendix. Descending and sigmoid diverticulosis. Stomach decompressed. Bowel loops otherwise unremarkable. Vascular/Lymphatic: Atherosclerotic calcifications aorta and iliac arteries without aneurysm. No adenopathy. Reproductive: Minimal prostatic enlargement. Other: No free air or free fluid. No definite hernia or inflammatory process. Musculoskeletal: Unremarkable IMPRESSION: Cholelithiasis without evidence of cholecystitis. Descending and sigmoid diverticulosis without evidence of diverticulitis. Minimal prostatic enlargement. No acute intra-abdominal or intrapelvic  abnormalities. Aortic Atherosclerosis (ICD10-I70.0). Electronically Signed   By: Lavonia Dana M.D.   On: 03/29/2022 15:32     LOS: 1 day   Oren Binet, MD  [facility]    To contact the attending provider between 7A-7P or the covering provider during after hours 7P-7A, please log into the web site [email or web address] and access using universal [place] password for that web site. If you do not have the password, please call the hospital operator.  03/30/2022, 10:35 AM

## 2022-03-31 ENCOUNTER — Inpatient Hospital Stay (HOSPITAL_COMMUNITY): Payer: No Typology Code available for payment source

## 2022-03-31 DIAGNOSIS — R112 Nausea with vomiting, unspecified: Secondary | ICD-10-CM | POA: Diagnosis not present

## 2022-03-31 DIAGNOSIS — R079 Chest pain, unspecified: Secondary | ICD-10-CM | POA: Diagnosis not present

## 2022-03-31 DIAGNOSIS — I2510 Atherosclerotic heart disease of native coronary artery without angina pectoris: Secondary | ICD-10-CM | POA: Diagnosis not present

## 2022-03-31 DIAGNOSIS — R7989 Other specified abnormal findings of blood chemistry: Secondary | ICD-10-CM | POA: Diagnosis not present

## 2022-03-31 DIAGNOSIS — N179 Acute kidney failure, unspecified: Secondary | ICD-10-CM | POA: Diagnosis not present

## 2022-03-31 DIAGNOSIS — I214 Non-ST elevation (NSTEMI) myocardial infarction: Secondary | ICD-10-CM

## 2022-03-31 DIAGNOSIS — B20 Human immunodeficiency virus [HIV] disease: Secondary | ICD-10-CM | POA: Diagnosis not present

## 2022-03-31 LAB — GASTROINTESTINAL PANEL BY PCR, STOOL (REPLACES STOOL CULTURE)

## 2022-03-31 LAB — ECHOCARDIOGRAM COMPLETE
AR max vel: 2.64 cm2
AV Area VTI: 2.75 cm2
AV Area mean vel: 2.81 cm2
AV Mean grad: 6 mmHg
AV Peak grad: 13.7 mmHg
Ao pk vel: 1.85 m/s
Area-P 1/2: 4.6 cm2
S' Lateral: 2.9 cm
Single Plane A4C EF: 65.6 %
Weight: 2850.11 oz

## 2022-03-31 LAB — HEPARIN LEVEL (UNFRACTIONATED): Heparin Unfractionated: 0.23 IU/mL — ABNORMAL LOW (ref 0.30–0.70)

## 2022-03-31 LAB — RENAL FUNCTION PANEL
Albumin: 3.5 g/dL (ref 3.5–5.0)
Anion gap: 7 (ref 5–15)
BUN: 38 mg/dL — ABNORMAL HIGH (ref 8–23)
CO2: 25 mmol/L (ref 22–32)
Calcium: 8.2 mg/dL — ABNORMAL LOW (ref 8.9–10.3)
Chloride: 105 mmol/L (ref 98–111)
Creatinine, Ser: 1.44 mg/dL — ABNORMAL HIGH (ref 0.61–1.24)
GFR, Estimated: 55 mL/min — ABNORMAL LOW (ref >60)
Glucose, Bld: 96 mg/dL (ref 70–99)
Phosphorus: 2.1 mg/dL — ABNORMAL LOW (ref 2.5–4.6)
Potassium: 4.2 mmol/L (ref 3.5–5.1)
Sodium: 137 mmol/L (ref 135–145)

## 2022-03-31 LAB — RAPID URINE DRUG SCREEN, HOSP PERFORMED
Amphetamines: NOT DETECTED
Barbiturates: NOT DETECTED
Benzodiazepines: NOT DETECTED
Cocaine: NOT DETECTED
Opiates: NOT DETECTED
Tetrahydrocannabinol: NOT DETECTED

## 2022-03-31 LAB — CBC
HCT: 34.4 % — ABNORMAL LOW (ref 39.0–52.0)
Hemoglobin: 11.7 g/dL — ABNORMAL LOW (ref 13.0–17.0)
MCH: 34.2 pg — ABNORMAL HIGH (ref 26.0–34.0)
MCHC: 34 g/dL (ref 30.0–36.0)
MCV: 100.6 fL — ABNORMAL HIGH (ref 80.0–100.0)
Platelets: 178 10*3/uL (ref 150–400)
RBC: 3.42 MIL/uL — ABNORMAL LOW (ref 4.22–5.81)
RDW: 12.4 % (ref 11.5–15.5)
WBC: 4.9 10*3/uL (ref 4.0–10.5)
nRBC: 0 % (ref 0.0–0.2)

## 2022-03-31 LAB — TROPONIN I (HIGH SENSITIVITY)
Troponin I (High Sensitivity): 901 ng/L (ref <18)
Troponin I (High Sensitivity): 693 ng/L (ref <18)

## 2022-03-31 LAB — GLUCOSE, CAPILLARY
Glucose-Capillary: 160 mg/dL — ABNORMAL HIGH (ref 70–99)
Glucose-Capillary: 126 mg/dL — ABNORMAL HIGH (ref 70–99)
Glucose-Capillary: 104 mg/dL — ABNORMAL HIGH (ref 70–99)
Glucose-Capillary: 140 mg/dL — ABNORMAL HIGH (ref 70–99)

## 2022-03-31 MED ORDER — CARVEDILOL 12.5 MG PO TABS
12.5000 mg | ORAL_TABLET | Freq: Two times a day (BID) | ORAL | Status: DC
  Administered 2022-03-31 – 2022-04-02 (×4): 12.5 mg via ORAL
  Filled 2022-03-31 (×4): qty 1

## 2022-03-31 MED ORDER — ALPRAZOLAM 0.25 MG PO TABS
0.2500 mg | ORAL_TABLET | Freq: Two times a day (BID) | ORAL | Status: DC | PRN
  Administered 2022-03-31 – 2022-04-01 (×3): 0.25 mg via ORAL
  Filled 2022-03-31 (×3): qty 1

## 2022-03-31 MED ORDER — NITROGLYCERIN 0.4 MG SL SUBL
0.4000 mg | SUBLINGUAL_TABLET | SUBLINGUAL | Status: DC | PRN
  Administered 2022-03-31: 0.4 mg via SUBLINGUAL
  Filled 2022-03-31: qty 1

## 2022-03-31 MED ORDER — HEPARIN BOLUS VIA INFUSION
2000.0000 [IU] | Freq: Once | INTRAVENOUS | Status: AC
  Administered 2022-03-31: 2000 [IU] via INTRAVENOUS
  Filled 2022-03-31: qty 2000

## 2022-03-31 MED ORDER — HEPARIN BOLUS VIA INFUSION
4000.0000 [IU] | Freq: Once | INTRAVENOUS | Status: AC
  Administered 2022-03-31: 4000 [IU] via INTRAVENOUS
  Filled 2022-03-31: qty 4000

## 2022-03-31 MED ORDER — MORPHINE SULFATE (PF) 2 MG/ML IV SOLN: 2.0000 mg | INTRAVENOUS | Status: DC | PRN

## 2022-03-31 MED ORDER — LACTATED RINGERS IV SOLN: INTRAVENOUS | Status: AC

## 2022-03-31 MED ORDER — HEPARIN (PORCINE) 25000 UT/250ML-% IV SOLN
1750.0000 [IU]/h | INTRAVENOUS | Status: DC
  Administered 2022-03-31: 950 [IU]/h via INTRAVENOUS
  Administered 2022-04-01: 1350 [IU]/h via INTRAVENOUS
  Administered 2022-04-01: 1750 [IU]/h via INTRAVENOUS
  Filled 2022-03-31 (×3): qty 250

## 2022-03-31 MED ORDER — BICTEGRAVIR-EMTRICITAB-TENOFOV 50-200-25 MG PO TABS
1.0000 | ORAL_TABLET | Freq: Every day | ORAL | Status: DC
  Administered 2022-03-31 – 2022-04-02 (×3): 1 via ORAL
  Filled 2022-03-31 (×3): qty 1

## 2022-03-31 MED ORDER — NITROGLYCERIN 0.4 MG SL SUBL
SUBLINGUAL_TABLET | SUBLINGUAL | Status: AC
  Administered 2022-03-31: 0.4 mg
  Filled 2022-03-31: qty 3

## 2022-03-31 NOTE — Progress Notes (Signed)
  Echocardiogram 2D Echocardiogram has been performed.  Jacob Ali 03/31/2022, 9:44 AM

## 2022-03-31 NOTE — Progress Notes (Signed)
[facility] Progress Note  Name: Jacob Ali MRN: [id number] DOB: [birth year]  Chief Complaint:  Abdominal pain  Subjective:  Spoke with primary team. He had some chest pain yesterday.  Cardiology was consulted and their evaluation is in process.  He had 1.9 liters UOP as well as 2 unmeasured urine voids over 3/16.    Post void residual bladder scan zero mL.  When I mention the charted diagnosis of diabetes he becomes frustrated and says that he was told he is pre-diabetic; he has been on metformin and farxiga.    Review of systems:  Chest pain yesterday - he states is resolved Denies shortness of breath  Denies n/v ------------- Background on consult:  Jacob Ali is a 62 y.o. male with a history of hypertension, coronary artery disease, HIV infection, and diabetes who presented to the hospital with abdominal pain.  He states that several people at work have been out with norovirus and though he wasn't tested for it he's had it before and that's what he thinks he has.  He had terrible nausea/vomiting and diarrhea Wednesday and had actually called EMS to his home Wednesday for same.  They evaluated him and recommended that he remain at home as vitals were stable.  Diarrhea, nausea, and vomiting resolved as of Thursday AM.  He states that he had limited urine output at home.  No pre-syncopal events prior to admission (but did have some presyncopal sensation last night.)  He started having diarrhea again this AM.  He is on lisinopril and farxiga and metformin.  He states that he had been taking aleve twice a day until a few months ago and stopped at that time secondary to dark stool.  He then stated that he took some aleve recently when he broke his ribs but just took a few doses; when I asked him when this occurred and how long he was on aleve before he became frustrated and stated that the examiner wasn't listening and that he was no longer taking aleve.  He was found to have  acute kidney injury with creatinine 8.44 as below.  He was started on bicarb gtt.  Nephrology was consulted for assistance with management of AKI.  His renal function is improving thankfully.  He was transitioned to [facility] today.  He had a CT a/p with cholelithiasis without cholecystitis and diverticulosis without diverticulitis.  There was no acute process noted.  He did have mild prostatic enlargement with kidneys and ureters unremarkable.  He states they scanned his bladder before he urinated and told him when he came in that there was nothing in his bladder (per charting on 3/15 with bladder scan of 230 ml- this may have been the random scan).  He had 1.2 liters UOP over 3/15 that was charted.    Intake/Output Summary (Last 24 hours) at 03/31/2022 0945 Last data filed at 03/31/2022 0626 Gross per 24 hour  Intake 480 ml  Output 1850 ml  Net -1370 ml    Vitals:  Vitals:   03/31/22 0218 03/31/22 0234 03/31/22 0400 03/31/22 0844  BP: (!) 181/75 (!) 143/76 130/68 (!) 145/69  Pulse: 77 88 71 83  Resp: 14 17 13 16   Temp: 98.1 F (36.7 C) 98 F (36.7 C) 98.4 F (36.9 C) 98.1 F (36.7 C)  TempSrc: Axillary Oral Oral Oral  SpO2: 94% 94% 94% 94%  Weight:         Physical Exam:  General adult male in bed in no  acute distress HEENT normocephalic atraumatic extraocular movements intact sclera anicteric Neck supple trachea midline Lungs clear to auscultation bilaterally normal work of breathing at rest  Heart S1S2 no rub Abdomen soft nontender nondistended Extremities no edema  Psych he is frustrated when discussing his medications and diagnoses Neuro alert and oriented x 3 provides hx and follows commands GU no foley in place  Medications reviewed   Labs:     Latest Ref Rng & Units 03/31/2022    4:10 AM 03/30/2022    3:37 AM 03/29/2022    2:11 PM  BMP  Glucose 70 - 99 mg/dL 96  103  115   BUN 8 - 23 mg/dL 38  77  90   Creatinine 0.61 - 1.24 mg/dL 1.44  4.71  8.44   Sodium 135 - 145  mmol/L 137  133  132   Potassium 3.5 - 5.1 mmol/L 4.2  3.3  5.3   Chloride 98 - 111 mmol/L 105  102  96   CO2 22 - 32 mmol/L 25  18  13    Calcium 8.9 - 10.3 mg/dL 8.2  7.3  8.6      Assessment/Plan:   # AKI  - Pre-renal insults with prolonged intractable nausea/vomiting/diarrhea in the setting of lisinopril and farxiga.  Note up/cr ratio 340 mg/g  - Improving with supportive care  - Continue LR but will reduce to 75 ml/hr  - holding lisinopril and farxiga  - Would also remain off of metformin indefinitely given severe AKI event    # Metabolic acidosis - Secondary to AKI and in setting of metformin use and GI losses - Improving with supportive care  - Improved with bicarb gtt and now transitioned to LR  - Would remain off of metformin    # Nausea/vomiting and diarrhea - Supportive care per primary team    # HTN  - Please hold lisinopril as you are    # Hyperkalemia - Potassium initially 5.3 and improved with bicarb gtt and previously slightly low - now on LR and tolerating    # Type 2 DM - 03/2021 A1c 6.6 with most recent check 4.8 in 02/2022 - Would not resume metformin given the hx of severe AKI event - note mild proteinuria as above - Goal would be to resume lisinopril and farxiga as tolerated after resolution of AKI    # HIV  - Regimen per primary team  - spoke with team and they are restarting his regimen today    Nephrology will sign off.  AKI resolving.  Disposition per primary team.  He will need follow-up with his PCP   Claudia Desanctis, MD 03/31/2022 10:20 AM

## 2022-03-31 NOTE — Telephone Encounter (Signed)
Component Ref Range & Units 1 d ago  Campylobacter species NOT DETECTED NOT DETECTED  Plesimonas shigelloides NOT DETECTED NOT DETECTED  Salmonella species NOT DETECTED NOT DETECTED  Yersinia enterocolitica NOT DETECTED NOT DETECTED  Vibrio species NOT DETECTED NOT DETECTED  Vibrio cholerae NOT DETECTED NOT DETECTED  Enteroaggregative E coli (EAEC) NOT DETECTED NOT DETECTED  Enteropathogenic E coli (EPEC) NOT DETECTED NOT DETECTED  Enterotoxigenic E coli (ETEC) NOT DETECTED NOT DETECTED  Shiga like toxin producing E coli (STEC) NOT DETECTED NOT DETECTED  Shigella/Enteroinvasive E coli (EIEC) NOT DETECTED NOT DETECTED  Cryptosporidium NOT DETECTED NOT DETECTED  Cyclospora cayetanensis NOT DETECTED NOT DETECTED  Entamoeba histolytica NOT DETECTED NOT DETECTED  Giardia lamblia NOT DETECTED NOT DETECTED  Adenovirus F40/41 NOT DETECTED NOT DETECTED  Astrovirus NOT DETECTED NOT DETECTED  Norovirus GI/GII NOT DETECTED NOT DETECTED  Rotavirus A NOT DETECTED DETECTED Abnormal   Sapovirus (I, II, IV, and V) NOT DETECTED NOT DETECTED  Comment: Performed at [facility], [address] [place], [place] [address]  Resulting Agency [facility]         Specimen Collected: 03/30/22 18:13 Last Resulted: 03/31/22 14:23        Latest Reference Range & Units 03/31/22 11:09  COCAINE NONE DETECTED  NONE DETECTED  Amphetamines NONE DETECTED  NONE DETECTED  Barbiturates NONE DETECTED  NONE DETECTED  Benzodiazepines NONE DETECTED  NONE DETECTED  Opiates NONE DETECTED  NONE DETECTED  Tetrahydrocannabinol NONE DETECTED  NONE DETECTED    Latest Reference Range & Units 03/31/22 04:10  Sodium 135 - 145 mmol/L 137  Potassium 3.5 - 5.1 mmol/L 4.2  Chloride 98 - 111 mmol/L 105  CO2 22 - 32 mmol/L 25  Glucose 70 - 99 mg/dL 96  BUN 8 - 23 mg/dL 38 (H)  Creatinine 0.61 - 1.24 mg/dL 1.44 (H)  Calcium 8.9 - 10.3 mg/dL 8.2 (L)  Anion gap 5 - 15  7  Phosphorus 2.5 - 4.6 mg/dL  2.1 (L)  Albumin 3.5 - 5.0 g/dL 3.5  GFR, Estimated >60 mL/min 55 (L)  WBC 4.0 - 10.5 K/uL 4.9  RBC 4.22 - 5.81 MIL/uL 3.42 (L)  Hemoglobin 13.0 - 17.0 g/dL 11.7 (L)  HCT 39.0 - 52.0 % 34.4 (L)  MCV 80.0 - 100.0 fL 100.6 (H)  MCH 26.0 - 34.0 pg 34.2 (H)  MCHC 30.0 - 36.0 g/dL 34.0  RDW 11.5 - 15.5 % 12.4  Platelets 150 - 400 K/uL 178  nRBC 0.0 - 0.2 % 0.0  (H): Data is abnormally high (L): Data is abnormally low  Patient reported he started to have chest pain last night.  His cardiac team saw him for elevated troponin levels and started on IV heparin and blood pressure medication adjusted overnight.  Kidney function improved.  Positive for rotavirus.  Exitcare handout sent to patient my chart.  Patient stated he will be in the hospital another 48 hours and to notify supervisor/HR team for him.  Discussed with patient electrolytes/kidney function improved again and almost back to his baseline now.  He stated nephrology spoke to him at length regarding his medications and he asked that she speak with his cardiac team.  Discussed with patient I would contact him again tomorrow to see if questions or concerns.  Message sent to HR and supervisor/supervisor's supervisor per policy absence.  Patient A&Ox3 spoke full sentences without difficulty respirations even and unlabored denied chest/abdomen pain at this time.  Patient agreed with plan of care and had  no further questions at this time

## 2022-03-31 NOTE — Progress Notes (Signed)
PROGRESS NOTE        PATIENT DETAILS Name: Jacob Ali Age: 62 y.o. Sex: male Date of Birth: 1960/02/17 Admit Date: 03/29/2022 Admitting Physician Evalee Mutton Kristeen Mans, MD [id number], Leticia Penna, NP  Brief Summary: Patient is a 62 y.o.  male HIV on [place], CAD s/p PCI to LAD January 2023, DM-2, HTN, HLD who presented to the ED with several days history of intractable nausea/vomiting/diarrhea-he was found to have AKI with a creatinine of 8.4 and subsequently admitted to the hospitalist service.  Significant events: 3/15>> admit to [facility] 3/17>> chest pain-EKG nonacute-troponins elevated.  Significant studies: 3/15>> CT abdomen/pelvis: No acute intra-abdominal or intrapelvic abnormalities  Significant microbiology data: 3/15>> COVID/influenza/RSV PCR: Negative 3/16>> stool C. difficile: Negative 3/16>> GI pathogen panel: Pending  Procedures: None  Consults: Nephrology Cardiology  Subjective: Had some diarrhea that reoccurred yesterday afternoon, no further diarrhea since he took Imodium last night.  He had left-sided chest pain earlier this morning-that resolved after taking nitroglycerin.  Objective: Vitals: Blood pressure (!) 151/70, pulse 72, temperature 98.1 F (36.7 C), temperature source Oral, resp. rate 14, weight 80.8 kg, SpO2 96 %.   Exam: Gen Exam:Alert awake-not in any distress HEENT:atraumatic, normocephalic Chest: B/L clear to auscultation anteriorly CVS:S1S2 regular Abdomen:soft non tender, non distended Extremities:no edema Neurology: Non focal Skin: no rash  Pertinent Labs/Radiology:    Latest Ref Rng & Units 03/31/2022    4:10 AM 03/30/2022    3:37 AM 03/29/2022    2:11 PM  CBC  WBC 4.0 - 10.5 K/uL 4.9  8.8  12.2   Hemoglobin 13.0 - 17.0 g/dL 11.7  13.1  15.9   Hematocrit 39.0 - 52.0 % 34.4  39.0  48.1   Platelets 150 - 400 K/uL 178  219  356     Lab Results  Component Value Date   NA 137 03/31/2022   K 4.2  03/31/2022   CL 105 03/31/2022   CO2 25 03/31/2022     Assessment/Plan: AKI Felt to be hemodynamically mediated in the setting of volume loss/lisinopril use Much better with IVF Continue to follow electrolytes  Metabolic acidosis Hyperkalemia Due to AKI Resolved bicarb infusion  Nausea/vomiting/diarrhea Likely viral illness GI symptoms had improved but reoccurred on 3/16-no further diarrhea overnight since he took some Imodium yesterday Tolerating advancement in diet  Non-STEMI Left-sided chest pain x 2 today-responded to nitroglycerin Troponin significantly elevated Continue antiplatelet/statin/beta-blocker-have added IV heparin Cardiology consulted Echo ordered  History of CAD s/p PCI to LAD January 2023 See above Continue Plavix/statin/Coreg  HTN BP controlled with Coreg Continue to hold lisinopril  HLD Statin  DM-2 (A1c 4.8 on 03/01/2022) CBG stable with SSI  Recent Labs    03/30/22 1637 03/30/22 2024 03/31/22 0842  GLUCAP 98 110* 160*     HIV (CD4 count 1866 on 11/07/2021) Discussed with pharmacy-resume Biktarvy today now that creatinine is much better.  BMI: Estimated body mass index is 26.31 kg/m as calculated from the following:   Height as of 03/01/22: 5\' 9"  (1.753 m).   Weight as of this encounter: 80.8 kg.   Code status:   Code Status: Full Code   DVT Prophylaxis:    Family Communication: None at bedside   Disposition Plan: Status is: Inpatient Remains inpatient appropriate because: Severity of illness   Planned Discharge Destination:Home   Diet: Diet Order  Diet heart healthy/carb modified Fluid consistency: Thin  Diet effective now                     Antimicrobial agents: Anti-infectives (From admission, onward)    None        MEDICATIONS: Scheduled Meds:  carvedilol  6.25 mg Oral BID WC   clopidogrel  75 mg Oral Daily   insulin aspart  0-9 Units Subcutaneous TID WC   pantoprazole  40 mg Oral  Daily   rosuvastatin  40 mg Oral Daily   sodium chloride flush  3 mL Intravenous Q12H   Continuous Infusions:  heparin 950 Units/hr (03/31/22 0921)   lactated ringers 75 mL/hr at 03/31/22 1005   PRN Meds:.acetaminophen **OR** acetaminophen, benzonatate, HYDROmorphone (DILAUDID) injection, loperamide, melatonin, morphine injection, nitroGLYCERIN, ondansetron **OR** ondansetron (ZOFRAN) IV   I have personally reviewed following labs and imaging studies  LABORATORY DATA: CBC: Recent Labs  Lab 03/29/22 1411 03/30/22 0337 03/31/22 0410  WBC 12.2* 8.8 4.9  HGB 15.9 13.1 11.7*  HCT 48.1 39.0 34.4*  MCV 101.5* 100.5* 100.6*  PLT 356 219 178     Basic Metabolic Panel: Recent Labs  Lab 03/29/22 1411 03/30/22 0337 03/31/22 0410  NA 132* 133* 137  K 5.3* 3.3* 4.2  CL 96* 102 105  CO2 13* 18* 25  GLUCOSE 115* 103* 96  BUN 90* 77* 38*  CREATININE 8.44* 4.71* 1.44*  CALCIUM 8.6* 7.3* 8.2*  MG 2.2  --   --   PHOS  --   --  2.1*     GFR: Estimated Creatinine Clearance: 52.5 mL/min (A) (by C-G formula based on SCr of 1.44 mg/dL (H)).  Liver Function Tests: Recent Labs  Lab 03/29/22 1411 03/31/22 0410  AST 31  --   ALT 22  --   ALKPHOS 35*  --   BILITOT 0.5  --   PROT 8.1  --   ALBUMIN 4.7 3.5    No results for input(s): "LIPASE", "AMYLASE" in the last 168 hours. No results for input(s): "AMMONIA" in the last 168 hours.  Coagulation Profile: No results for input(s): "INR", "PROTIME" in the last 168 hours.  Cardiac Enzymes: No results for input(s): "CKTOTAL", "CKMB", "CKMBINDEX", "TROPONINI" in the last 168 hours.  BNP (last 3 results) No results for input(s): "PROBNP" in the last 8760 hours.  Lipid Profile: No results for input(s): "CHOL", "HDL", "LDLCALC", "TRIG", "CHOLHDL", "LDLDIRECT" in the last 72 hours.  Thyroid Function Tests: No results for input(s): "TSH", "T4TOTAL", "FREET4", "T3FREE", "THYROIDAB" in the last 72 hours.  Anemia Panel: No results  for input(s): "VITAMINB12", "FOLATE", "FERRITIN", "TIBC", "IRON", "RETICCTPCT" in the last 72 hours.  Urine analysis:    Component Value Date/Time   COLORURINE YELLOW 03/29/2022 1934   APPEARANCEUR CLEAR 03/29/2022 1934   LABSPEC 1.018 03/29/2022 1934   PHURINE 5.0 03/29/2022 1934   GLUCOSEU 100 (A) 03/29/2022 1934   HGBUR SMALL (A) 03/29/2022 1934   BILIRUBINUR NEGATIVE 03/29/2022 1934   BILIRUBINUR neg 09/30/2014 0910   KETONESUR NEGATIVE 03/29/2022 1934   PROTEINUR 30 (A) 03/29/2022 1934   UROBILINOGEN 0.2 09/30/2014 0910   UROBILINOGEN 0.2 05/25/2014 0903   NITRITE NEGATIVE 03/29/2022 1934   LEUKOCYTESUR NEGATIVE 03/29/2022 1934    Sepsis Labs: Lactic Acid, Venous No results found for: "LATICACIDVEN"  MICROBIOLOGY: Recent Results (from the past 240 hour(s))  Resp panel by RT-PCR (RSV, Flu A&B, Covid) Anterior Nasal Swab     Status: None   Collection Time: 03/29/22  5:10 PM   Specimen: Anterior Nasal Swab  Result Value Ref Range Status   SARS Coronavirus 2 by RT PCR NEGATIVE NEGATIVE Final    Comment: (NOTE) SARS-CoV-2 target nucleic acids are NOT DETECTED.  The SARS-CoV-2 RNA is generally detectable in upper respiratory specimens during the acute phase of infection. The lowest concentration of SARS-CoV-2 viral copies this assay can detect is 138 copies/mL. A negative result does not preclude SARS-Cov-2 infection and should not be used as the sole basis for treatment or other patient management decisions. A negative result may occur with  improper specimen collection/handling, submission of specimen other than nasopharyngeal swab, presence of viral mutation(s) within the areas targeted by this assay, and inadequate number of viral copies(<138 copies/mL). A negative result must be combined with clinical observations, patient history, and epidemiological information. The expected result is Negative.  Fact Sheet for Patients:   EntrepreneurPulse.com.au  Fact Sheet for Healthcare Providers:  IncredibleEmployment.be  This test is no t yet approved or cleared by the [place] FDA and  has been authorized for detection and/or diagnosis of SARS-CoV-2 by FDA under an Emergency Use Authorization (EUA). This EUA will remain  in effect (meaning this test can be used) for the duration of the COVID-19 declaration under Section 564(b)(1) of the Act, 21 U.S.C.section 360bbb-3(b)(1), unless the authorization is terminated  or revoked sooner.       Influenza A by PCR NEGATIVE NEGATIVE Final   Influenza B by PCR NEGATIVE NEGATIVE Final    Comment: (NOTE) The Xpert Xpress SARS-CoV-2/FLU/RSV plus assay is intended as an aid in the diagnosis of influenza from Nasopharyngeal swab specimens and should not be used as a sole basis for treatment. Nasal washings and aspirates are unacceptable for Xpert Xpress SARS-CoV-2/FLU/RSV testing.  Fact Sheet for Patients: EntrepreneurPulse.com.au  Fact Sheet for Healthcare Providers: IncredibleEmployment.be  This test is not yet approved or cleared by the [place] FDA and has been authorized for detection and/or diagnosis of SARS-CoV-2 by FDA under an Emergency Use Authorization (EUA). This EUA will remain in effect (meaning this test can be used) for the duration of the COVID-19 declaration under Section 564(b)(1) of the Act, 21 U.S.C. section 360bbb-3(b)(1), unless the authorization is terminated or revoked.     Resp Syncytial Virus by PCR NEGATIVE NEGATIVE Final    Comment: (NOTE) Fact Sheet for Patients: EntrepreneurPulse.com.au  Fact Sheet for Healthcare Providers: IncredibleEmployment.be  This test is not yet approved or cleared by the [place] FDA and has been authorized for detection and/or diagnosis of SARS-CoV-2 by FDA under an Emergency Use  Authorization (EUA). This EUA will remain in effect (meaning this test can be used) for the duration of the COVID-19 declaration under Section 564(b)(1) of the Act, 21 U.S.C. section 360bbb-3(b)(1), unless the authorization is terminated or revoked.  Performed at [facility], [place], [place] [address]   C Difficile Quick Screen w PCR reflex     Status: None   Collection Time: 03/30/22  6:13 PM   Specimen: STOOL  Result Value Ref Range Status   C Diff antigen NEGATIVE NEGATIVE Final   C Diff toxin NEGATIVE NEGATIVE Final   C Diff interpretation No C. difficile detected.  Final    Comment: Performed at [facility], [place] [address], [place], [place] [address]    RADIOLOGY STUDIES/RESULTS: CT ABDOMEN PELVIS WO CONTRAST  Result Date: 03/29/2022 CLINICAL DATA:  Abdominal pain worse today, diagnosed with norovirus, nausea, vomiting, diarrhea EXAM:  CT ABDOMEN AND PELVIS WITHOUT CONTRAST TECHNIQUE: Multidetector CT imaging of the abdomen and pelvis was performed following the standard protocol without IV contrast. RADIATION DOSE REDUCTION: This exam was performed according to the departmental dose-optimization program which includes automated exposure control, adjustment of the mA and/or kV according to patient size and/or use of iterative reconstruction technique. COMPARISON:  Of 09/2017 FINDINGS: Lower chest: Lung bases clear Hepatobiliary: Dependent calcified gallstones within gallbladder. No gallbladder wall thickening or biliary dilatation. Liver unremarkable. Pancreas: Normal appearance Spleen: Normal appearance.  Small splenule anterior to spleen. Adrenals/Urinary Tract: Adrenal glands normal appearance. Kidneys, ureters, and bladder unremarkable. Stomach/Bowel: Normal appendix. Descending and sigmoid diverticulosis. Stomach decompressed. Bowel loops otherwise unremarkable. Vascular/Lymphatic: Atherosclerotic calcifications aorta and iliac arteries  without aneurysm. No adenopathy. Reproductive: Minimal prostatic enlargement. Other: No free air or free fluid. No definite hernia or inflammatory process. Musculoskeletal: Unremarkable IMPRESSION: Cholelithiasis without evidence of cholecystitis. Descending and sigmoid diverticulosis without evidence of diverticulitis. Minimal prostatic enlargement. No acute intra-abdominal or intrapelvic abnormalities. Aortic Atherosclerosis (ICD10-I70.0). Electronically Signed   By: Lavonia Dana M.D.   On: 03/29/2022 15:32     LOS: 2 days   Oren Binet, MD  [facility]    To contact the attending provider between 7A-7P or the covering provider during after hours 7P-7A, please log into the web site [email or web address] and access using universal [place] password for that web site. If you do not have the password, please call the hospital operator.  03/31/2022, 10:12 AM

## 2022-03-31 NOTE — Progress Notes (Signed)
Pt c/o left sided chest pain rating 7/10.Adult emergency protocol started.Gave him a dose of Nitroglycerin,did EKG.After a dose of Nitro pt's pain was 0/10.VS stable.MD made aware.Rapid response made aware.

## 2022-03-31 NOTE — Progress Notes (Signed)
ANTICOAGULATION CONSULT NOTE - Initial Consult  Pharmacy Consult for heparin Indication: chest pain/ACS  Allergies  Allergen Reactions   Atorvastatin Other (See Comments)    myalgias   Tamiflu [Oseltamivir Phosphate] Other (See Comments)    Depressive symptoms   Patient Measurements: Weight: 80.8 kg (178 lb 2.1 oz) Heparin Dosing Weight:  80.8 kg  Vital Signs: Temp: 98.4 F (36.9 C) (03/17 0400) Temp Source: Oral (03/17 0400) BP: 130/68 (03/17 0400) Pulse Rate: 71 (03/17 0400)  Labs: Recent Labs    03/29/22 1411 03/30/22 0337 03/31/22 0405 03/31/22 0410  HGB 15.9 13.1  --  11.7*  HCT 48.1 39.0  --  34.4*  PLT 356 219  --  178  CREATININE 8.44* 4.71*  --  1.44*  TROPONINIHS  --   --  901*  --    Estimated Creatinine Clearance: 52.5 mL/min (A) (by C-G formula based on SCr of 1.44 mg/dL (H)).  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 Patient now with left sided chest pain. Pharmacy consulted to dose heparin. Hgb 11.7, plt 178.   Goal of Therapy:  Heparin level 0.3-0.7 units/ml Monitor platelets by anticoagulation protocol: Yes   Plan:  Give 4000 units bolus x 1 Start heparin infusion at 960 units/hr Check anti-Xa level in 6 hours and daily while on heparin Continue to monitor H&H and platelets  Jeneen Rinks [id number] AM

## 2022-03-31 NOTE — Progress Notes (Signed)
ANTICOAGULATION CONSULT NOTE - Initial Consult  Pharmacy Consult for heparin Indication: chest pain/ACS  Allergies  Allergen Reactions   Atorvastatin Other (See Comments)    myalgias   Tamiflu [Oseltamivir Phosphate] Other (See Comments)    Depressive symptoms   Patient Measurements: Weight: 80.8 kg (178 lb 2.1 oz) Heparin Dosing Weight:  80.8 kg  Vital Signs: Temp: 98.4 F (36.9 C) (03/17 1604) Temp Source: Oral (03/17 1604) BP: 153/76 (03/17 1604) Pulse Rate: 79 (03/17 1604)  Labs: Recent Labs    03/29/22 1411 03/30/22 0337 03/31/22 0405 03/31/22 0410 03/31/22 0949 03/31/22 1536  HGB 15.9 13.1  --  11.7*  --   --   HCT 48.1 39.0  --  34.4*  --   --   PLT 356 219  --  178  --   --   HEPARINUNFRC  --   --   --   --   --  0.23*  CREATININE 8.44* 4.71*  --  1.44*  --   --   TROPONINIHS  --   --  901*  --  693*  --     Estimated Creatinine Clearance: 52.5 mL/min (A) (by C-G formula based on SCr of 1.44 mg/dL (H)).  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 Patient now with left sided chest pain. Pharmacy consulted to dose heparin. Hgb 11.7, plt 178.   Heparin level came back at 0.23. We will give a small bolus and increase rate. Check another heparin level.   Goal of Therapy:  Heparin level  0.3-0.7 units/ml Monitor platelets by anticoagulation protocol: Yes   Plan:  Heparin bolus 2000 units x1 Increase heparin to 1100 units/hr. Check 8 hr HL then daily  Onnie Boer, PharmD, BCIDP, AAHIVP, CPP Infectious Disease Pharmacist 03/31/2022 5:19 PM
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
## 2022-04-01 ENCOUNTER — Other Ambulatory Visit (HOSPITAL_COMMUNITY): Payer: Self-pay

## 2022-04-01 DIAGNOSIS — B20 Human immunodeficiency virus [HIV] disease: Secondary | ICD-10-CM | POA: Diagnosis not present

## 2022-04-01 DIAGNOSIS — N179 Acute kidney failure, unspecified: Secondary | ICD-10-CM | POA: Diagnosis not present

## 2022-04-01 DIAGNOSIS — E1165 Type 2 diabetes mellitus with hyperglycemia: Secondary | ICD-10-CM

## 2022-04-01 DIAGNOSIS — K529 Noninfective gastroenteritis and colitis, unspecified: Secondary | ICD-10-CM | POA: Diagnosis not present

## 2022-04-01 DIAGNOSIS — I2510 Atherosclerotic heart disease of native coronary artery without angina pectoris: Secondary | ICD-10-CM | POA: Diagnosis not present

## 2022-04-01 LAB — CBC
HCT: 36.1 % — ABNORMAL LOW (ref 39.0–52.0)
Hemoglobin: 12.1 g/dL — ABNORMAL LOW (ref 13.0–17.0)
MCH: 34.1 pg — ABNORMAL HIGH (ref 26.0–34.0)
MCHC: 33.5 g/dL (ref 30.0–36.0)
MCV: 101.7 fL — ABNORMAL HIGH (ref 80.0–100.0)
Platelets: 195 10*3/uL (ref 150–400)
RBC: 3.55 MIL/uL — ABNORMAL LOW (ref 4.22–5.81)
RDW: 12.2 % (ref 11.5–15.5)
WBC: 5.7 10*3/uL (ref 4.0–10.5)
nRBC: 0 % (ref 0.0–0.2)

## 2022-04-01 LAB — BASIC METABOLIC PANEL
Anion gap: 8 (ref 5–15)
BUN: 22 mg/dL (ref 8–23)
CO2: 22 mmol/L (ref 22–32)
Calcium: 8.3 mg/dL — ABNORMAL LOW (ref 8.9–10.3)
Chloride: 107 mmol/L (ref 98–111)
Creatinine, Ser: 0.85 mg/dL (ref 0.61–1.24)
GFR, Estimated: >60 mL/min (ref >60)
Glucose, Bld: 96 mg/dL (ref 70–99)
Potassium: 3.8 mmol/L (ref 3.5–5.1)
Sodium: 137 mmol/L (ref 135–145)

## 2022-04-01 LAB — GLUCOSE, CAPILLARY
Glucose-Capillary: 117 mg/dL — ABNORMAL HIGH (ref 70–99)
Glucose-Capillary: 108 mg/dL — ABNORMAL HIGH (ref 70–99)
Glucose-Capillary: 102 mg/dL — ABNORMAL HIGH (ref 70–99)
Glucose-Capillary: 146 mg/dL — ABNORMAL HIGH (ref 70–99)

## 2022-04-01 LAB — HEPARIN LEVEL (UNFRACTIONATED)
Heparin Unfractionated: 0.11 IU/mL — ABNORMAL LOW (ref 0.30–0.70)
Heparin Unfractionated: 0.15 IU/mL — ABNORMAL LOW (ref 0.30–0.70)
Heparin Unfractionated: 0.26 IU/mL — ABNORMAL LOW (ref 0.30–0.70)

## 2022-04-01 MED ORDER — HEPARIN BOLUS VIA INFUSION
1200.0000 [IU] | Freq: Once | INTRAVENOUS | Status: AC
  Administered 2022-04-01: 1200 [IU] via INTRAVENOUS
  Filled 2022-04-01: qty 1200

## 2022-04-01 MED ORDER — HEPARIN BOLUS VIA INFUSION
2000.0000 [IU] | Freq: Once | INTRAVENOUS | Status: AC
  Administered 2022-04-01: 2000 [IU] via INTRAVENOUS
  Filled 2022-04-01: qty 2000

## 2022-04-01 MED ORDER — HEPARIN BOLUS VIA INFUSION
2500.0000 [IU] | Freq: Once | INTRAVENOUS | Status: AC
  Administered 2022-04-01: 2500 [IU] via INTRAVENOUS
  Filled 2022-04-01: qty 2500

## 2022-04-01 NOTE — Evaluation (Signed)
Physical Therapy Evaluation and Discharge Patient Details Name: Jacob Ali MRN: [id number] DOB: 07/14/1960 Today's Date: 04/01/2022  History of Present Illness  62 y.o. male admitted 3/15 for intractable nausea/vomiting/diarrhea-he was found to have AKI, GI panel + for rotavirus. PMHx: HIV on [place]tar[place], CAD s/p PCI to LAD January 2023, DM-2, HTN, HLD  Clinical Impression  Patient evaluated by Physical Therapy with no further acute PT needs identified. All education has been completed and the patient has no further questions. 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 Feels that he is improving closer to baseline. Passed BERG balance test 53/56 indicating low fall risk. Ambulates without physical assist, HR 80, no chest pain. Eager to go home. Questions if he would be eligible for rotavirus vaccine as he has had this several times now apparently; he will follow-up with Dr. Johnnye Sima at his next OP visit to discuss. See below for any follow-up Physical Therapy or equipment needs. PT is signing off. Thank you for this referral.        Recommendations for follow up therapy are one component of a multi-disciplinary discharge planning process, led by the attending physician.  Recommendations may be updated based on patient status, additional functional criteria and insurance authorization.  Follow Up Recommendations No PT follow up (Would like to go to cardiac rehab again - he will follow up with cards at his next OP visit.)      Assistance Recommended at Discharge None  Patient can return home with the following       Equipment Recommendations None recommended by PT  Recommendations for Other Services       Functional Status Assessment Patient has had a recent decline in their functional status and demonstrates the ability to make significant improvements in function in a reasonable and predictable amount of time.     Precautions / Restrictions  Precautions Precautions: Fall Restrictions Weight Bearing Restrictions: No      Mobility  Bed Mobility Overal bed mobility: Independent                  Transfers Overall transfer level: Modified independent Equipment used: None               General transfer comment: Minimal instability, no assist needed    Ambulation/Gait Ambulation/Gait assistance: Supervision Gait Distance (Feet): 200 Feet Assistive device: None, IV Pole Gait Pattern/deviations: Step-through pattern, Drifts right/left Gait velocity: decr Gait velocity interpretation: 1.31 - 2.62 ft/sec, indicative of limited community ambulator   General Gait Details: Ambulated with and without IV pole for support, therapist at supervision level for safety and to manage lines/leads. No overt instability, denies chest pain, HR 80. No physical assist required throughout gait training.  Stairs            Wheelchair Mobility    Modified Rankin (Stroke Patients Only)       Balance Overall balance assessment: Modified Independent                               Standardized Balance Assessment Standardized Balance Assessment : Berg Balance Test Berg Balance Test Sit to Stand: Able to stand without using hands and stabilize independently Standing Unsupported: Able to stand safely 2 minutes Sitting with Back Unsupported but Feet Supported on Floor or Stool: Able to sit safely and securely 2 minutes Stand to Sit: Sits safely with minimal use of hands Transfers: Able to transfer safely,  minor use of hands Standing Unsupported with Eyes Closed: Able to stand 10 seconds safely Standing Ubsupported with Feet Together: Able to place feet together independently and stand 1 minute safely From Standing, Reach Forward with Outstretched Arm: Can reach confidently >25 cm (10") From Standing Position, Pick up Object from Floor: Able to pick up shoe safely and easily From Standing Position, Turn to Look  Behind Over each Shoulder: Looks behind from both sides and weight shifts well Turn 360 Degrees: Able to turn 360 degrees safely in 4 seconds or less Standing Unsupported, Alternately Place Feet on Step/Stool: Able to stand independently and complete 8 steps >20 seconds Standing Unsupported, One Foot in Front: Able to plae foot ahead of the other independently and hold 30 seconds Standing on One Leg: Able to lift leg independently and hold 5-10 seconds Total Score: 53         Pertinent Vitals/Pain Pain Assessment Pain Assessment: No/denies pain    Home Living Family/patient expects to be discharged to:: Private residence Living Arrangements: Alone Available Help at Discharge: Friend(s);Available 24 hours/day Type of Home: House Home Access: Level entry       Home Layout: One level Home Equipment: Cane - single point      Prior Function Prior Level of Function : Working/employed;Independent/Modified Independent             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 ADLs Comments: ind     Hand Dominance   Dominant Hand: Right    Extremity/Trunk Assessment   Upper Extremity Assessment Upper Extremity Assessment: Defer to OT evaluation    Lower Extremity Assessment Lower Extremity Assessment: Overall WFL for tasks assessed       Communication   Communication: No difficulties  Cognition Arousal/Alertness: Awake/alert Behavior During Therapy: WFL for tasks assessed/performed Overall Cognitive Status: Within Functional Limits for tasks assessed                                          General Comments General comments (skin integrity, edema, etc.): VSS    Exercises     Assessment/Plan    PT Assessment Patient does not need any further PT services  PT Problem List         PT Treatment Interventions      PT Goals (Current goals can be found in the Care Plan section)  Acute Rehab PT Goals Patient Stated Goal: Get well PT Goal  Formulation: All assessment and education complete, DC therapy    Frequency       Co-evaluation               AM-PAC PT "6 Clicks" Mobility  Outcome Measure Help needed turning from your back to your side while in a flat bed without using bedrails?: None Help needed moving from lying on your back to sitting on the side of a flat bed without using bedrails?: None Help needed moving to and from a bed to a chair (including a wheelchair)?: None Help needed standing up from a chair using your arms (e.g., wheelchair or bedside chair)?: None Help needed to walk in hospital room?: A Little Help needed climbing 3-5 steps with a railing? : A Little 6 Click Score: 22    End of Session   Activity Tolerance: Patient tolerated treatment well Patient left: in bed;with call bell/phone within reach;with bed alarm set Nurse Communication: Mobility status  PT Visit Diagnosis: Other abnormalities of gait and mobility (R26.89)    Time: [id number] PT Time Calculation (min) (ACUTE ONLY): 34 min   Charges:   PT Evaluation $PT Eval Low Complexity: 1 Low PT Treatments $Therapeutic Activity: 8-22 mins        Candie Mile, PT, DPT Physical Therapist Acute Rehabilitation Services [place] 04/01/2022, 3:08 PM

## 2022-04-01 NOTE — Progress Notes (Signed)
PROGRESS NOTE        PATIENT DETAILS Name: Jacob Ali Age: 62 y.o. Sex: male Date of Birth: Dec 02, 1960 Admit Date: 03/29/2022 Admitting Physician Evalee Mutton Kristeen Mans, MD [id number], Leticia Penna, NP  Brief Summary: Patient is a 62 y.o.  male HIV on [place], CAD s/p PCI to LAD January 2023, DM-2, HTN, HLD who presented to the ED with several days history of intractable nausea/vomiting/diarrhea-he was found to have AKI with a creatinine of 8.4 and subsequently admitted to the hospitalist service.  Significant events: 3/15>> admit to [facility] 3/17>> chest pain-EKG nonacute-troponins elevated.  Significant studies: 3/15>> CT abdomen/pelvis: No acute intra-abdominal or intrapelvic abnormalities  Significant microbiology data: 3/15>> COVID/influenza/RSV PCR: Negative 3/16>> stool C. difficile: Negative 3/16>> GI pathogen panel: R[place] virus  Procedures: None  Consults: Nephrology Cardiology  Subjective: No further chest pain-no diarrhea-in fact no BM for the past 2 days.  Feels significantly better  Objective: Vitals: Blood pressure (!) 153/76, pulse 75, temperature 98.6 F (37 C), temperature source Oral, resp. rate 18, weight 81 kg, SpO2 95 %.   Exam: Gen Exam:Alert awake-not in any distress HEENT:atraumatic, normocephalic Chest: B/L clear to auscultation anteriorly CVS:S1S2 regular Abdomen:soft non tender, non distended Extremities:no edema Neurology: Non focal Skin: no rash  Pertinent Labs/Radiology:    Latest Ref Rng & Units 04/01/2022    3:06 AM 03/31/2022    4:10 AM 03/30/2022    3:37 AM  CBC  WBC 4.0 - 10.5 K/uL 5.7  4.9  8.8   Hemoglobin 13.0 - 17.0 g/dL 12.1  11.7  13.1   Hematocrit 39.0 - 52.0 % 36.1  34.4  39.0   Platelets 150 - 400 K/uL 195  178  219     Lab Results  Component Value Date   NA 137 04/01/2022   K 3.8 04/01/2022   CL 107 04/01/2022   CO2 22 04/01/2022     Assessment/Plan: AKI Felt to be hemodynamically  mediated in the setting of volume loss/lisinopril use Resolved after IV fluid hydration and resolution of GI symptoms.  Metabolic acidosis Hyperkalemia Due to AKI Resolved bicarb infusion and improvement in AKI  Nausea/vomiting/diarrhea Due to rotavirus No further diarrhea/vomiting Diet has been advanced  Non-STEMI Left-sided chest pain x 2 on 3/17 Echo with stable EF-no regional wall motion abnormality On IV heparin x 48 hours Continue antiplatelet agents/beta-blocker/statin Cardiology following.    History of CAD s/p PCI to LAD January 2023 See above Continue Plavix/statin/Coreg  HTN BP controlled with Coreg Continue to hold lisinopril  HLD Statin  DM-2 (A1c 4.8 on 03/01/2022) CBG stable with SSI  Recent Labs    03/31/22 1649 03/31/22 2038 04/01/22 0820  GLUCAP 104* 140* 117*     HIV (CD4 count 1866 on 11/07/2021) On Biktarvy  Debility/deconditioning Due to acute illness PT/OT eval  BMI: Estimated body mass index is 26.37 kg/m as calculated from the following:   Height as of 03/01/22: 5\' 9"  (1.753 m).   Weight as of this encounter: 81 kg.   Code status:   Code Status: Full Code   DVT Prophylaxis:    Family Communication: None at bedside   Disposition Plan: Status is: Inpatient Remains inpatient appropriate because: Severity of illness   Planned Discharge Destination:Home likely 3/19   Diet: Diet Order             Diet  heart healthy/carb modified Fluid consistency: Thin  Diet effective now                     Antimicrobial agents: Anti-infectives (From admission, onward)    Start     Dose/Rate Route Frequency Ordered Stop   03/31/22 1115  bictegravir-emtricitabine-tenofovir AF (BIKTARVY) 50-200-25 MG per tablet 1 tablet        1 tablet Oral Daily 03/31/22 1018          MEDICATIONS: Scheduled Meds:  bictegravir-emtricitabine-tenofovir AF  1 tablet Oral Daily   carvedilol  12.5 mg Oral BID WC   clopidogrel  75 mg Oral  Daily   insulin aspart  0-9 Units Subcutaneous TID WC   pantoprazole  40 mg Oral Daily   rosuvastatin  40 mg Oral Daily   sodium chloride flush  3 mL Intravenous Q12H   Continuous Infusions:  heparin 1,350 Units/hr (04/01/22 0450)   PRN Meds:.acetaminophen **OR** acetaminophen, ALPRAZolam, benzonatate, HYDROmorphone (DILAUDID) injection, loperamide, melatonin, morphine injection, nitroGLYCERIN, ondansetron **OR** ondansetron (ZOFRAN) IV   I have personally reviewed following labs and imaging studies  LABORATORY DATA: CBC: Recent Labs  Lab 03/29/22 1411 03/30/22 0337 03/31/22 0410 04/01/22 0306  WBC 12.2* 8.8 4.9 5.7  HGB 15.9 13.1 11.7* 12.1*  HCT 48.1 39.0 34.4* 36.1*  MCV 101.5* 100.5* 100.6* 101.7*  PLT 356 219 178 195     Basic Metabolic Panel: Recent Labs  Lab 03/29/22 1411 03/30/22 0337 03/31/22 0410 04/01/22 0306  NA 132* 133* 137 137  K 5.3* 3.3* 4.2 3.8  CL 96* 102 105 107  CO2 13* 18* 25 22  GLUCOSE 115* 103* 96 96  BUN 90* 77* 38* 22  CREATININE 8.44* 4.71* 1.44* 0.85  CALCIUM 8.6* 7.3* 8.2* 8.3*  MG 2.2  --   --   --   PHOS  --   --  2.1*  --      GFR: Estimated Creatinine Clearance: 89 mL/min (by C-G formula based on SCr of 0.85 mg/dL).  Liver Function Tests: Recent Labs  Lab 03/29/22 1411 03/31/22 0410  AST 31  --   ALT 22  --   ALKPHOS 35*  --   BILITOT 0.5  --   PROT 8.1  --   ALBUMIN 4.7 3.5    No results for input(s): "LIPASE", "AMYLASE" in the last 168 hours. No results for input(s): "AMMONIA" in the last 168 hours.  Coagulation Profile: No results for input(s): "INR", "PROTIME" in the last 168 hours.  Cardiac Enzymes: No results for input(s): "CKTOTAL", "CKMB", "CKMBINDEX", "TROPONINI" in the last 168 hours.  BNP (last 3 results) No results for input(s): "PROBNP" in the last 8760 hours.  Lipid Profile: No results for input(s): "CHOL", "HDL", "LDLCALC", "TRIG", "CHOLHDL", "LDLDIRECT" in the last 72 hours.  Thyroid  Function Tests: No results for input(s): "TSH", "T4TOTAL", "FREET4", "T3FREE", "THYROIDAB" in the last 72 hours.  Anemia Panel: No results for input(s): "VITAMINB12", "FOLATE", "FERRITIN", "TIBC", "IRON", "RETICCTPCT" in the last 72 hours.  Urine analysis:    Component Value Date/Time   COLORURINE YELLOW 03/29/2022 1934   APPEARANCEUR CLEAR 03/29/2022 1934   LABSPEC 1.018 03/29/2022 1934   PHURINE 5.0 03/29/2022 1934   GLUCOSEU 100 (A) 03/29/2022 1934   HGBUR SMALL (A) 03/29/2022 1934   BILIRUBINUR NEGATIVE 03/29/2022 1934   BILIRUBINUR neg 09/30/2014 0910   KETONESUR NEGATIVE 03/29/2022 1934   PROTEINUR 30 (A) 03/29/2022 1934   UROBILINOGEN 0.2 09/30/2014 0910   UROBILINOGEN 0.2  05/25/2014 0903   NITRITE NEGATIVE 03/29/2022 1934   LEUKOCYTESUR NEGATIVE 03/29/2022 1934    Sepsis Labs: Lactic Acid, Venous No results found for: "LATICACIDVEN"  MICROBIOLOGY: Recent Results (from the past 240 hour(s))  Resp panel by RT-PCR (RSV, Flu A&B, Covid) Anterior Nasal Swab     Status: None   Collection Time: 03/29/22  5:10 PM   Specimen: Anterior Nasal Swab  Result Value Ref Range Status   SARS Coronavirus 2 by RT PCR NEGATIVE NEGATIVE Final    Comment: (NOTE) SARS-CoV-2 target nucleic acids are NOT DETECTED.  The SARS-CoV-2 RNA is generally detectable in upper respiratory specimens during the acute phase of infection. The lowest concentration of SARS-CoV-2 viral copies this assay can detect is 138 copies/mL. A negative result does not preclude SARS-Cov-2 infection and should not be used as the sole basis for treatment or other patient management decisions. A negative result may occur with  improper specimen collection/handling, submission of specimen other than nasopharyngeal swab, presence of viral mutation(s) within the areas targeted by this assay, and inadequate number of viral copies(<138 copies/mL). A negative result must be combined with clinical observations, patient  history, and epidemiological information. The expected result is Negative.  Fact Sheet for Patients:  EntrepreneurPulse.com.au  Fact Sheet for Healthcare Providers:  IncredibleEmployment.be  This test is no t yet approved or cleared by the [place] FDA and  has been authorized for detection and/or diagnosis of SARS-CoV-2 by FDA under an Emergency Use Authorization (EUA). This EUA will remain  in effect (meaning this test can be used) for the duration of the COVID-19 declaration under Section 564(b)(1) of the Act, 21 U.S.C.section 360bbb-3(b)(1), unless the authorization is terminated  or revoked sooner.       Influenza A by PCR NEGATIVE NEGATIVE Final   Influenza B by PCR NEGATIVE NEGATIVE Final    Comment: (NOTE) The Xpert Xpress SARS-CoV-2/FLU/RSV plus assay is intended as an aid in the diagnosis of influenza from Nasopharyngeal swab specimens and should not be used as a sole basis for treatment. Nasal washings and aspirates are unacceptable for Xpert Xpress SARS-CoV-2/FLU/RSV testing.  Fact Sheet for Patients: EntrepreneurPulse.com.au  Fact Sheet for Healthcare Providers: IncredibleEmployment.be  This test is not yet approved or cleared by the [place] FDA and has been authorized for detection and/or diagnosis of SARS-CoV-2 by FDA under an Emergency Use Authorization (EUA). This EUA will remain in effect (meaning this test can be used) for the duration of the COVID-19 declaration under Section 564(b)(1) of the Act, 21 U.S.C. section 360bbb-3(b)(1), unless the authorization is terminated or revoked.     Resp Syncytial Virus by PCR NEGATIVE NEGATIVE Final    Comment: (NOTE) Fact Sheet for Patients: EntrepreneurPulse.com.au  Fact Sheet for Healthcare Providers: IncredibleEmployment.be  This test is not yet approved or cleared by the [place] FDA  and has been authorized for detection and/or diagnosis of SARS-CoV-2 by FDA under an Emergency Use Authorization (EUA). This EUA will remain in effect (meaning this test can be used) for the duration of the COVID-19 declaration under Section 564(b)(1) of the Act, 21 U.S.C. section 360bbb-3(b)(1), unless the authorization is terminated or revoked.  Performed at [facility], [address], [place], [place] [address]   Gastrointestinal Panel by PCR , Stool     Status: Abnormal   Collection Time: 03/30/22  6:13 PM   Specimen: STOOL  Result Value Ref Range Status   Campylobacter species NOT DETECTED NOT DETECTED Final   Plesimonas shigelloides  NOT DETECTED NOT DETECTED Final   Salmonella species NOT DETECTED NOT DETECTED Final   Yersinia enterocolitica NOT DETECTED NOT DETECTED Final   Vibrio species NOT DETECTED NOT DETECTED Final   Vibrio cholerae NOT DETECTED NOT DETECTED Final   Enteroaggregative E coli (EAEC) NOT DETECTED NOT DETECTED Final   Enteropathogenic E coli (EPEC) NOT DETECTED NOT DETECTED Final   Enterotoxigenic E coli (ETEC) NOT DETECTED NOT DETECTED Final   Shiga like toxin producing E coli (STEC) NOT DETECTED NOT DETECTED Final   Shigella/Enteroinvasive E coli (EIEC) NOT DETECTED NOT DETECTED Final   Cryptosporidium NOT DETECTED NOT DETECTED Final   Cyclospora cayetanensis NOT DETECTED NOT DETECTED Final   Entamoeba histolytica NOT DETECTED NOT DETECTED Final   Giardia lamblia NOT DETECTED NOT DETECTED Final   Adenovirus F40/41 NOT DETECTED NOT DETECTED Final   Astrovirus NOT DETECTED NOT DETECTED Final   Norovirus GI/GII NOT DETECTED NOT DETECTED Final   Rotavirus A DETECTED (A) NOT DETECTED Final   Sapovirus (I, II, IV, and V) NOT DETECTED NOT DETECTED Final    Comment: Performed at [facility], [address] [place], [place] [address]  C Difficile Quick Screen w PCR reflex     Status: None   Collection Time: 03/30/22  6:13 PM    Specimen: STOOL  Result Value Ref Range Status   C Diff antigen NEGATIVE NEGATIVE Final   C Diff toxin NEGATIVE NEGATIVE Final   C Diff interpretation No C. difficile detected.  Final    Comment: Performed at [facility], [place] [address], [place], [place] [address]    RADIOLOGY STUDIES/RESULTS: ECHOCARDIOGRAM COMPLETE  Result Date: 03/31/2022    ECHOCARDIOGRAM REPORT   Patient Name:   SPARSH KEAST Date of Exam: 03/31/2022 Medical Rec #:  [id number]      Height:       69.0 in Accession #:    [id number]     Weight:       178.1 lb Date of Birth:  Oct 15, 1960     BSA:          1.967 m Patient Age:    4 years       BP:           145/69 mmHg Patient Gender: M              HR:           78 bpm. Exam Location:  Inpatient Procedure: Color Doppler, Cardiac Doppler and 2D Echo Indications:    Elevated Troponin  History:        Patient has prior history of Echocardiogram examinations, most                 recent 01/31/2021. CAD, HIV; Risk Factors:Hypertension and                 Diabetes.  Sonographer:    Marella Chimes Referring Phys: [place]  1. Left ventricular ejection fraction, by estimation, is 60 to 65%. The left ventricle has normal function. The left ventricle has no regional wall motion abnormalities. Left ventricular diastolic parameters are indeterminate.  2. Right ventricular systolic function is normal. The right ventricular size is normal.  3. The mitral valve is normal in structure. No evidence of mitral valve regurgitation. No evidence of mitral stenosis.  4. The aortic valve is tricuspid. Aortic valve regurgitation is not visualized. No aortic stenosis is present.  5. The inferior vena cava is normal in  size with greater than 50% respiratory variability, suggesting right atrial pressure of 3 mmHg. Comparison(s): No significant change from prior study. Prior images reviewed side by side. FINDINGS  Left Ventricle: Left ventricular ejection fraction, by estimation, is 60 to  65%. The left ventricle has normal function. The left ventricle has no regional wall motion abnormalities. The left ventricular internal cavity size was normal in size. There is  no left ventricular hypertrophy. Left ventricular diastolic parameters are indeterminate. Right Ventricle: The right ventricular size is normal. No increase in right ventricular wall thickness. Right ventricular systolic function is normal. Left Atrium: Left atrial size was normal in size. Right Atrium: Right atrial size was normal in size. Pericardium: There is no evidence of pericardial effusion. Mitral Valve: The mitral valve is normal in structure. No evidence of mitral valve regurgitation. No evidence of mitral valve stenosis. Tricuspid Valve: The tricuspid valve is normal in structure. Tricuspid valve regurgitation is not demonstrated. No evidence of tricuspid stenosis. Aortic Valve: The aortic valve is tricuspid. Aortic valve regurgitation is not visualized. No aortic stenosis is present. Aortic valve mean gradient measures 6.0 mmHg. Aortic valve peak gradient measures 13.7 mmHg. Aortic valve area, by VTI measures 2.75  cm. Pulmonic Valve: The pulmonic valve was normal in structure. Pulmonic valve regurgitation is not visualized. No evidence of pulmonic stenosis. Aorta: The aortic root is normal in size and structure. Venous: The inferior vena cava is normal in size with greater than 50% respiratory variability, suggesting right atrial pressure of 3 mmHg. IAS/Shunts: No atrial level shunt detected by color flow Doppler.  LEFT VENTRICLE PLAX 2D LVIDd:         4.15 cm     Diastology LVIDs:         2.90 cm     LV e' medial:    8.05 cm/s LV PW:         1.05 cm     LV E/e' medial:  11.1 LV IVS:        1.05 cm     LV e' lateral:   8.81 cm/s LVOT diam:     2.10 cm     LV E/e' lateral: 10.2 LV SV:         93 LV SV Index:   47 LVOT Area:     3.46 cm  LV Volumes (MOD) LV vol d, MOD A4C: 87.7 ml LV vol s, MOD A4C: 30.2 ml LV SV MOD A4C:      87.7 ml RIGHT VENTRICLE             IVC RV S prime:     15.70 cm/s  IVC diam: 2.40 cm TAPSE (M-mode): 2.2 cm LEFT ATRIUM             Index        RIGHT ATRIUM           Index LA Vol (A2C):   54.2 ml 27.56 ml/m  RA Area:     11.20 cm LA Vol (A4C):   48.2 ml 24.51 ml/m  RA Volume:   22.00 ml  11.19 ml/m LA Biplane Vol: 51.7 ml 26.29 ml/m  AORTIC VALVE AV Area (Vmax):    2.64 cm AV Area (Vmean):   2.81 cm AV Area (VTI):     2.75 cm AV Vmax:           185.00 cm/s AV Vmean:          112.000 cm/s AV VTI:  0.338 m AV Peak Grad:      13.7 mmHg AV Mean Grad:      6.0 mmHg LVOT Vmax:         141.00 cm/s LVOT Vmean:        90.900 cm/s LVOT VTI:          0.268 m LVOT/AV VTI ratio: 0.79  AORTA Ao Root diam: 2.80 cm Ao Asc diam:  2.90 cm MITRAL VALVE MV Area (PHT): 4.60 cm    SHUNTS MV Decel Time: 165 msec    Systemic VTI:  0.27 m MV E velocity: 89.60 cm/s  Systemic Diam: 2.10 cm MV A velocity: 90.70 cm/s MV E/A ratio:  0.99 Candee Furbish MD Electronically signed by Candee Furbish MD Signature Date/Time: 03/31/2022/11:02:28 AM    Final      LOS: 3 days   Oren Binet, MD  [facility]    To contact the attending provider between 7A-7P or the covering provider during after hours 7P-7A, please log into the web site [email or web address] and access using universal [place] password for that web site. If you do not have the password, please call the hospital operator.  04/01/2022, 9:49 AM

## 2022-04-01 NOTE — Progress Notes (Signed)
ANTICOAGULATION CONSULT NOTE - Initial Consult  Pharmacy Consult for heparin Indication: chest pain/ACS  Allergies  Allergen Reactions   Atorvastatin Other (See Comments)    myalgias   Tamiflu [Oseltamivir Phosphate] Other (See Comments)    Depressive symptoms   Patient Measurements: Weight: 81 kg (178 lb 9.2 oz) Heparin Dosing Weight:  80.8 kg  Vital Signs: Temp: 98.4 F (36.9 C) (03/18 1955) Temp Source: Oral (03/18 1955) BP: 171/68 (03/18 1955) Pulse Rate: 81 (03/18 1955)  Labs: Recent Labs    03/30/22 0337 03/31/22 0405 03/31/22 0410 03/31/22 0949 03/31/22 1536 04/01/22 0306 04/01/22 1125 04/01/22 2013  HGB 13.1  --  11.7*  --   --  12.1*  --   --   HCT 39.0  --  34.4*  --   --  36.1*  --   --   PLT 219  --  178  --   --  195  --   --   HEPARINUNFRC  --   --   --   --    < > 0.11* 0.15* 0.26*  CREATININE 4.71*  --  1.44*  --   --  0.85  --   --   TROPONINIHS  --  901*  --  693*  --   --   --   --    < > = values in this interval not displayed.    Estimated Creatinine Clearance: 89 mL/min (by C-G formula based on SCr of 0.85 mg/dL).  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 Patient now with left sided chest pain. Pharmacy consulted to dose heparin.   Hgb 12.1, plt 195 - stable  Heparin level 0.26, subtherapeutic Current  heparin infusion rate: 1600 units/hr No s/sx of bleeding per RN report  Goal of Therapy:  Heparin level 0.3-0.7 units/ml Monitor platelets by anticoagulation protocol: Yes   Plan:  Heparin bolus 1200 units x1 Increase heparin to 1750 units/hr. Check heparin level in 6 hours Monitor daily CBC, heparin level, and for s/sx of bleeding  Luisa Hart, PharmD, BCPS Clinical Pharmacist 04/01/2022 8:58 PM   Please refer to [facility] for pharmacy phone number

## 2022-04-01 NOTE — Progress Notes (Addendum)
Rounding Note    Patient Name: Jacob Ali Date of Encounter: 04/01/2022  [place] Cardiologist: [name], MD   Subjective   Pt feels well, eager to discharge home.   Inpatient Medications    Scheduled Meds:  bictegravir-emtricitabine-tenofovir AF  1 tablet Oral Daily   carvedilol  12.5 mg Oral BID WC   clopidogrel  75 mg Oral Daily   insulin aspart  0-9 Units Subcutaneous TID WC   pantoprazole  40 mg Oral Daily   rosuvastatin  40 mg Oral Daily   sodium chloride flush  3 mL Intravenous Q12H   Continuous Infusions:  heparin 1,350 Units/hr (04/01/22 0450)   PRN Meds: acetaminophen **OR** acetaminophen, ALPRAZolam, benzonatate, HYDROmorphone (DILAUDID) injection, loperamide, melatonin, morphine injection, nitroGLYCERIN, ondansetron **OR** ondansetron (ZOFRAN) IV   Vital Signs    Vitals:   04/01/22 0000 04/01/22 0300 04/01/22 0400 04/01/22 0757  BP: (!) 141/67  (!) 132/56 (!) 153/76  Pulse: 73  66 75  Resp: 14  18 18   Temp: 98 F (36.7 C)  98.6 F (37 C) 98.6 F (37 C)  TempSrc: Axillary  Oral Oral  SpO2: 95%  94% 95%  Weight:  81 kg      Intake/Output Summary (Last 24 hours) at 04/01/2022 0956 Last data filed at 04/01/2022 0450 Gross per 24 hour  Intake 286.64 ml  Output 600 ml  Net -313.36 ml      04/01/2022    3:00 AM 03/30/2022    5:00 AM 03/01/2022    2:11 PM  Last 3 Weights  Weight (lbs) 178 lb 9.2 oz 178 lb 2.1 oz 184 lb  Weight (kg) 81 kg 80.8 kg 83.462 kg      Telemetry    Sinus rhythm HR 70s - Personally Reviewed  ECG    No new tracings - Personally Reviewed  Physical Exam   GEN: No acute distress.   Neck: No JVD Cardiac: RRR, no murmurs, rubs, or gallops.  Respiratory: Clear to auscultation bilaterally. GI: Soft, nontender, non-distended  MS: No edema; No deformity. Neuro:  Nonfocal  Psych: Normal affect   Labs    High Sensitivity Troponin:   Recent Labs  Lab 03/31/22 0405 03/31/22 0949   TROPONINIHS 901* 693*     Chemistry Recent Labs  Lab 03/29/22 1411 03/30/22 0337 03/31/22 0410 04/01/22 0306  NA 132* 133* 137 137  K 5.3* 3.3* 4.2 3.8  CL 96* 102 105 107  CO2 13* 18* 25 22  GLUCOSE 115* 103* 96 96  BUN 90* 77* 38* 22  CREATININE 8.44* 4.71* 1.44* 0.85  CALCIUM 8.6* 7.3* 8.2* 8.3*  MG 2.2  --   --   --   PROT 8.1  --   --   --   ALBUMIN 4.7  --  3.5  --   AST 31  --   --   --   ALT 22  --   --   --   ALKPHOS 35*  --   --   --   BILITOT 0.5  --   --   --   GFRNONAA 7* 13* 55* >60  ANIONGAP 23* 13 7 8     Lipids No results for input(s): "CHOL", "TRIG", "HDL", "LABVLDL", "LDLCALC", "CHOLHDL" in the last 168 hours.  Hematology Recent Labs  Lab 03/30/22 0337 03/31/22 0410 04/01/22 0306  WBC 8.8 4.9 5.7  RBC 3.88* 3.42* 3.55*  HGB 13.1 11.7* 12.1*  HCT 39.0 34.4* 36.1*  MCV  100.5* 100.6* 101.7*  MCH 33.8 34.2* 34.1*  MCHC 33.6 34.0 33.5  RDW 12.9 12.4 12.2  PLT 219 178 195   Thyroid No results for input(s): "TSH", "FREET4" in the last 168 hours.  BNPNo results for input(s): "BNP", "PROBNP" in the last 168 hours.  DDimer No results for input(s): "DDIMER" in the last 168 hours.   Radiology    ECHOCARDIOGRAM COMPLETE  Result Date: 03/31/2022    ECHOCARDIOGRAM REPORT   Patient Name:   [name] Date of Exam: 03/31/2022 Medical Rec #:  [id number]      Height:       69.0 in Accession #:    [id number]     Weight:       178.1 lb Date of Birth:  [birth year]     BSA:          1.967 m Patient Age:    62 years       BP:           145/69 mmHg Patient Gender: M              HR:           78 bpm. Exam Location:  Inpatient Procedure: Color Doppler, Cardiac Doppler and 2D Echo Indications:    Elevated Troponin  History:        Patient has prior history of Echocardiogram examinations, most                 recent 01/31/2021. CAD, HIV; Risk Factors:Hypertension and                 Diabetes.  Sonographer:    Marella Chimes Referring Phys: [place]  1.  Left ventricular ejection fraction, by estimation, is 60 to 65%. The left ventricle has normal function. The left ventricle has no regional wall motion abnormalities. Left ventricular diastolic parameters are indeterminate.  2. Right ventricular systolic function is normal. The right ventricular size is normal.  3. The mitral valve is normal in structure. No evidence of mitral valve regurgitation. No evidence of mitral stenosis.  4. The aortic valve is tricuspid. Aortic valve regurgitation is not visualized. No aortic stenosis is present.  5. The inferior vena cava is normal in size with greater than 50% respiratory variability, suggesting right atrial pressure of 3 mmHg. Comparison(s): No significant change from prior study. Prior images reviewed side by side. FINDINGS  Left Ventricle: Left ventricular ejection fraction, by estimation, is 60 to 65%. The left ventricle has normal function. The left ventricle has no regional wall motion abnormalities. The left ventricular internal cavity size was normal in size. There is  no left ventricular hypertrophy. Left ventricular diastolic parameters are indeterminate. Right Ventricle: The right ventricular size is normal. No increase in right ventricular wall thickness. Right ventricular systolic function is normal. Left Atrium: Left atrial size was normal in size. Right Atrium: Right atrial size was normal in size. Pericardium: There is no evidence of pericardial effusion. Mitral Valve: The mitral valve is normal in structure. No evidence of mitral valve regurgitation. No evidence of mitral valve stenosis. Tricuspid Valve: The tricuspid valve is normal in structure. Tricuspid valve regurgitation is not demonstrated. No evidence of tricuspid stenosis. Aortic Valve: The aortic valve is tricuspid. Aortic valve regurgitation is not visualized. No aortic stenosis is present. Aortic valve mean gradient measures 6.0 mmHg. Aortic valve peak gradient measures 13.7 mmHg. Aortic valve  area, by VTI measures 2.75  cm. Pulmonic Valve: The  pulmonic valve was normal in structure. Pulmonic valve regurgitation is not visualized. No evidence of pulmonic stenosis. Aorta: The aortic root is normal in size and structure. Venous: The inferior vena cava is normal in size with greater than 50% respiratory variability, suggesting right atrial pressure of 3 mmHg. IAS/Shunts: No atrial level shunt detected by color flow Doppler.  LEFT VENTRICLE PLAX 2D LVIDd:         4.15 cm     Diastology LVIDs:         2.90 cm     LV e' medial:    8.05 cm/s LV PW:         1.05 cm     LV E/e' medial:  11.1 LV IVS:        1.05 cm     LV e' lateral:   8.81 cm/s LVOT diam:     2.10 cm     LV E/e' lateral: 10.2 LV SV:         93 LV SV Index:   47 LVOT Area:     3.46 cm  LV Volumes (MOD) LV vol d, MOD A4C: 87.7 ml LV vol s, MOD A4C: 30.2 ml LV SV MOD A4C:     87.7 ml RIGHT VENTRICLE             IVC RV S prime:     15.70 cm/s  IVC diam: 2.40 cm TAPSE (M-mode): 2.2 cm LEFT ATRIUM             Index        RIGHT ATRIUM           Index LA Vol (A2C):   54.2 ml 27.56 ml/m  RA Area:     11.20 cm LA Vol (A4C):   48.2 ml 24.51 ml/m  RA Volume:   22.00 ml  11.19 ml/m LA Biplane Vol: 51.7 ml 26.29 ml/m  AORTIC VALVE AV Area (Vmax):    2.64 cm AV Area (Vmean):   2.81 cm AV Area (VTI):     2.75 cm AV Vmax:           185.00 cm/s AV Vmean:          112.000 cm/s AV VTI:            0.338 m AV Peak Grad:      13.7 mmHg AV Mean Grad:      6.0 mmHg LVOT Vmax:         141.00 cm/s LVOT Vmean:        90.900 cm/s LVOT VTI:          0.268 m LVOT/AV VTI ratio: 0.79  AORTA Ao Root diam: 2.80 cm Ao Asc diam:  2.90 cm MITRAL VALVE MV Area (PHT): 4.60 cm    SHUNTS MV Decel Time: 165 msec    Systemic VTI:  0.27 m MV E velocity: 89.60 cm/s  Systemic Diam: 2.10 cm MV A velocity: 90.70 cm/s MV E/A ratio:  0.99 Candee Furbish MD Electronically signed by Candee Furbish MD Signature Date/Time: 03/31/2022/11:02:28 AM    Final     Cardiac Studies   Echo 03/31/22:   1. Left ventricular ejection fraction, by estimation, is 60 to 65%. The  left ventricle has normal function. The left ventricle has no regional  wall motion abnormalities. Left ventricular diastolic parameters are  indeterminate.   2. Right ventricular systolic function is normal. The right ventricular  size is normal.   3. The mitral valve is  normal in structure. No evidence of mitral valve  regurgitation. No evidence of mitral stenosis.   4. The aortic valve is tricuspid. Aortic valve regurgitation is not  visualized. No aortic stenosis is present.   5. The inferior vena cava is normal in size with greater than 50%  respiratory variability, suggesting right atrial pressure of 3 mmHg.   Patient Profile     62 y.o. male with a hx of CAD (NSTEMI 01/2021 s/p DES to LAD), PAD (PTA/angioplasty to L SFA and popliteal arteries and stent to R CIA 2018 with residual decline in ABIs treated medically), prior cocaine use, very small subdural hemorrhage in 03/2020 after fall managed conservatively, GERD, HIV, HTN, HLD, OSA, tobacco abuse, colon CA who is being seen 03/31/2022 for the evaluation of chest pain/elevated troponin   Assessment & Plan    Rotavirus A / gastroenteritis Resolving, per primary Leukocytosis resolved   AKI sCr  [id number] with metabolic acidosis and electrolyte derangement Nephrology consulted Renal function has returned to baseline   Chest pain / elevated troponin In the setting of acute illness with AKI HST trended down Echocardiogram reassuring Heparin gtt x 48 hrs No further chest pain Will continue plavix, no ASA   CAD  PCI with DES-midLAD Did not tolerate ASA due to S/S concerning for GI bleed Plavix, coreg PTA: farxiga, lisinopril - both held for AKI, consider resuming at follow up as renal function permits   Prior cocaine use UDS negative this admission Continue coreg   IDA, B12 deficiency Prior positive hemoccult Black stools resolved after  discontinuation of NSAIDs and ASA Has not had a colonoscopy due to social stressors Per primary   Cardiology follow up has been arranged.    For questions or updates, please contact [place] Please consult [email or web address] for contact info under        Signed, Ledora Bottcher, PA  04/01/2022, 9:56 AM    Personally seen and examined. Agree with above.  One more day of Heparin IV Trop 900>600 Type 2 MI in the setting of rotavirus. AKI resolved No cardiac cath.  Alert, RRR, CTAB.   Candee Furbish, MD .

## 2022-04-01 NOTE — Progress Notes (Signed)
ANTICOAGULATION CONSULT NOTE - Follow Up Consult  Pharmacy Consult for heparin Indication: NSTEMI vs demand ischemia  Labs: Recent Labs    03/30/22 0337 03/31/22 0405 03/31/22 0410 03/31/22 0949 03/31/22 1536 04/01/22 0306  HGB 13.1  --  11.7*  --   --  12.1*  HCT 39.0  --  34.4*  --   --  36.1*  PLT 219  --  178  --   --  195  HEPARINUNFRC  --   --   --   --  0.23* 0.11*  CREATININE 4.71*  --  1.44*  --   --  0.85  TROPONINIHS  --  901*  --  693*  --   --     Assessment: 61yo male subtherapeutic on heparin with lower heparin level despite increased rate; no infusion issues or signs of bleeding per RN.  Goal of Therapy:  Heparin level 0.3-0.7 units/ml   Plan:  Will rebolus with heparin 2000 units and increase heparin infusion by 3 units/kg/hr to 1350 units/hr and check level in 6 hours.    Wynona Neat, PharmD, BCPS  04/01/2022,4:42 AM

## 2022-04-01 NOTE — Progress Notes (Signed)
ANTICOAGULATION CONSULT NOTE - Initial Consult  Pharmacy Consult for heparin Indication: chest pain/ACS  Allergies  Allergen Reactions   Atorvastatin Other (See Comments)    myalgias   Tamiflu [Oseltamivir Phosphate] Other (See Comments)    Depressive symptoms   Patient Measurements: Weight: 81 kg (178 lb 9.2 oz) Heparin Dosing Weight:  80.8 kg  Vital Signs: Temp: 97.9 F (36.6 C) (03/18 1200) Temp Source: Oral (03/18 1200) BP: 149/87 (03/18 1218) Pulse Rate: 76 (03/18 1223)  Labs: Recent Labs    03/30/22 0337 03/31/22 0405 03/31/22 0410 03/31/22 0949 03/31/22 1536 04/01/22 0306 04/01/22 1125  HGB 13.1  --  11.7*  --   --  12.1*  --   HCT 39.0  --  34.4*  --   --  36.1*  --   PLT 219  --  178  --   --  195  --   HEPARINUNFRC  --   --   --   --  0.23* 0.11* 0.15*  CREATININE 4.71*  --  1.44*  --   --  0.85  --   TROPONINIHS  --  901*  --  693*  --   --   --     Estimated Creatinine Clearance: 89 mL/min (by C-G formula based on SCr of 0.85 mg/dL).  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 Patient now with left sided chest pain. Pharmacy consulted to dose heparin. Hgb 11.7, plt 178.   Heparin level came back at 0.15. We will give a small bolus and increase rate. Check another heparin level.   Goal of Therapy:  Heparin level 0.3-0.7  units/ml Monitor platelets by anticoagulation protocol: Yes   Plan:  Heparin bolus 2500 units x1 Increase heparin to 1600 units/hr. Check 6 hr HL then daily  Hanako Tipping A. Levada Dy, PharmD, BCPS, FNKF Clinical Pharmacist [place] Please utilize Amion for appropriate phone number to reach the unit pharmacist ([place])

## 2022-04-01 NOTE — Telephone Encounter (Signed)
Labs stable continues on heparin hospitalized  Latest Reference Range & Units 04/01/22 03:06  Sodium 135 - 145 mmol/L 137  Potassium 3.5 - 5.1 mmol/L 3.8  Chloride 98 - 111 mmol/L 107  CO2 22 - 32 mmol/L 22  Glucose 70 - 99 mg/dL 96  BUN 8 - 23 mg/dL 22  Creatinine 0.61 - 1.24 mg/dL 0.85  Calcium 8.9 - 10.3 mg/dL 8.3 (L)  Anion gap 5 - 15  8  GFR, Estimated >60 mL/min >60  WBC 4.0 - 10.5 K/uL 5.7  RBC 4.22 - 5.81 MIL/uL 3.55 (L)  Hemoglobin 13.0 - 17.0 g/dL 12.1 (L)  HCT 39.0 - 52.0 % 36.1 (L)  MCV 80.0 - 100.0 fL 101.7 (H)  MCH 26.0 - 34.0 pg 34.1 (H)  MCHC 30.0 - 36.0 g/dL 33.5  RDW 11.5 - 15.5 % 12.2  Platelets 150 - 400 K/uL 195  nRBC 0.0 - 0.2 % 0.0  Heparin Unfractionated 0.30 - 0.70 IU/mL 0.11 (L)  (L): Data is abnormally low (H): Data is abnormally high

## 2022-04-02 DIAGNOSIS — E782 Mixed hyperlipidemia: Secondary | ICD-10-CM

## 2022-04-02 DIAGNOSIS — E1165 Type 2 diabetes mellitus with hyperglycemia: Secondary | ICD-10-CM | POA: Diagnosis not present

## 2022-04-02 DIAGNOSIS — N179 Acute kidney failure, unspecified: Secondary | ICD-10-CM | POA: Diagnosis not present

## 2022-04-02 DIAGNOSIS — B20 Human immunodeficiency virus [HIV] disease: Secondary | ICD-10-CM | POA: Diagnosis not present

## 2022-04-02 LAB — CBC
HCT: 36.3 % — ABNORMAL LOW (ref 39.0–52.0)
Hemoglobin: 12.8 g/dL — ABNORMAL LOW (ref 13.0–17.0)
MCH: 34.7 pg — ABNORMAL HIGH (ref 26.0–34.0)
MCHC: 35.3 g/dL (ref 30.0–36.0)
MCV: 98.4 fL (ref 80.0–100.0)
Platelets: 191 10*3/uL (ref 150–400)
RBC: 3.69 MIL/uL — ABNORMAL LOW (ref 4.22–5.81)
RDW: 11.9 % (ref 11.5–15.5)
WBC: 7.2 10*3/uL (ref 4.0–10.5)
nRBC: 0 % (ref 0.0–0.2)

## 2022-04-02 LAB — GLUCOSE, CAPILLARY: Glucose-Capillary: 203 mg/dL — ABNORMAL HIGH (ref 70–99)

## 2022-04-02 LAB — BASIC METABOLIC PANEL
Anion gap: 9 (ref 5–15)
BUN: 19 mg/dL (ref 8–23)
CO2: 23 mmol/L (ref 22–32)
Calcium: 9 mg/dL (ref 8.9–10.3)
Chloride: 104 mmol/L (ref 98–111)
Creatinine, Ser: 1.08 mg/dL (ref 0.61–1.24)
GFR, Estimated: >60 mL/min (ref >60)
Glucose, Bld: 125 mg/dL — ABNORMAL HIGH (ref 70–99)
Potassium: 4.1 mmol/L (ref 3.5–5.1)
Sodium: 136 mmol/L (ref 135–145)

## 2022-04-02 LAB — HEPARIN LEVEL (UNFRACTIONATED): Heparin Unfractionated: 0.37 IU/mL (ref 0.30–0.70)

## 2022-04-02 NOTE — TOC Transition Note (Signed)
Transition of Care [facility]) - CM/SW Discharge Note   Patient Details  Name: Jacob Ali MRN: [id number] Date of Birth: 01/10/1961  [facility] CM/SW Contact:  Levonne Lapping, RN Phone Number: 04/02/2022, 9:10 AM   Clinical Narrative:     Patient to dc to home today  No TOC needs have been identified           Patient Goals and CMS Choice      Discharge Placement                           Discharge Plan and Services Additional resources added to the After Visit Summary for                                       Social Determinants of Health (SDOH) Interventions SDOH Screenings   Food Insecurity: No Food Insecurity (04/01/2022)  Housing: [place]  (04/01/2022)  Transportation Needs: No Transportation Needs (04/01/2022)  Utilities: Not At Risk (04/01/2022)  Depression (PHQ2-9): Low Risk  (03/01/2022)  Tobacco Use: High Risk (03/29/2022)     Readmission Risk Interventions     No data to display

## 2022-04-02 NOTE — Discharge Summary (Signed)
PATIENT DETAILS Name: Jacob Ali Age: 62 y.o. Sex: male Date of Birth: 1960/08/16 MRN: [id number]. Admitting Physician: Jonetta Osgood, MD [id number], Leticia Penna, NP  Admit Date: 03/29/2022 Discharge date: 04/02/2022  Recommendations for Outpatient Follow-up:  Follow up with PCP in 1-2 weeks Please obtain CMP/CBC in one week  Admitted From:  Home  Disposition: Home   Discharge Condition: good  CODE STATUS:   Code Status: Full Code   Diet recommendation:  Diet Order             Diet - low sodium heart healthy           Diet Carb Modified           Diet heart healthy/carb modified Fluid consistency: Thin  Diet effective now                    Brief Summary: Patient is a 62 y.o.  male HIV on [place], CAD s/p PCI to LAD January 2023, DM-2, HTN, HLD who presented to the ED with several days history of intractable nausea/vomiting/diarrhea-he was found to have AKI with a creatinine of 8.4 and subsequently admitted to the hospitalist service.   Significant events: 3/15>> admit to [facility] 3/17>> chest pain-EKG nonacute-troponins elevated.   Significant studies: 3/15>> CT abdomen/pelvis: No acute intra-abdominal or intrapelvic abnormalities   Significant microbiology data: 3/15>> COVID/influenza/RSV PCR: Negative 3/16>> stool C. difficile: Negative 3/16>> GI pathogen panel: [place] virus   Procedures: None   Consults: Nephrology Cardiology  Brief Hospital Course: AKI Felt to be hemodynamically mediated in the setting of volume loss/lisinopril use Resolved after IV fluid hydration and resolution of GI symptoms.   Metabolic acidosis Hyperkalemia Due to AKI Resolved bicarb infusion and improvement in AKI   Nausea/vomiting/diarrhea Due to rotavirus No further diarrhea/vomiting Diet has been advanced   Non-STEMI Left-sided chest pain x 2 on 3/17-not responsive to nitroglycerin-further chest pain since 3/17  Echo with stable EF-no regional wall  motion abnormality Placed on IV heparin x 48 hours Continue antiplatelet agents/beta-blocker/statin Cardiology followed closely-conservative management recommended.   History of CAD s/p PCI to LAD January 2023 See above Continue Plavix/statin/Coreg   HTN BP controlled with Coreg Now that renal function has improved-okay to resume lisinopril.   HLD Statin/fenofibrate  DM-2 (A1c 4.8 on 03/01/2022) CBG stable with SSI Will discontinue metformin on discharge-recommended by nephrology Continue Farxiga   HIV (CD4 count 1866 on 11/07/2021) On Biktarvy   Debility/deconditioning Due to acute illness Improved-evaluated by PT/OT-no further recommendations   Discharge Diagnoses:  Principal Problem:   Acute renal failure (ARF) ([place]) Active Problems:   Nausea, vomiting, and diarrhea   HIV (human immunodeficiency virus infection) ([place])   Essential (primary) hypertension   CAD in native artery   Type 2 diabetes mellitus ([place])   Hyperlipidemia   Discharge Instructions:  Activity:  As tolerated    Discharge Instructions     Call MD for:  difficulty breathing, headache or visual disturbances   Complete by: As directed    Diet - low sodium heart healthy   Complete by: As directed    Diet Carb Modified   Complete by: As directed    Discharge instructions   Complete by: As directed    Follow with Primary MD  Pleas Koch, NP in 1-2 weeks  Please get a complete blood count and chemistry panel checked by your Primary MD at your next visit, and again as instructed by your Primary MD.  Get  Medicines reviewed and adjusted: Please take all your medications with you for your next visit with your Primary MD  Laboratory/radiological data: Please request your Primary MD to go over all hospital tests and procedure/radiological results at the follow up, please ask your Primary MD to get all Hospital records sent to his/her office.  In some cases, they will be blood work,  cultures and biopsy results pending at the time of your discharge. Please request that your primary care M.D. follows up on these results.  Also Note the following: If you experience worsening of your admission symptoms, develop shortness of breath, life threatening emergency, suicidal or homicidal thoughts you must seek medical attention immediately by calling 911 or calling your MD immediately  if symptoms less severe.  You must read complete instructions/literature along with all the possible adverse reactions/side effects for all the Medicines you take and that have been prescribed to you. Take any new Medicines after you have completely understood and accpet all the possible adverse reactions/side effects.   Do not drive when taking Pain medications or sleeping medications (Benzodaizepines)  Do not take more than prescribed Pain, Sleep and Anxiety Medications. It is not advisable to combine anxiety,sleep and pain medications without talking with your primary care practitioner  Special Instructions: If you have smoked or chewed Tobacco  in the last 2 yrs please stop smoking, stop any regular Alcohol  and or any Recreational drug use.  Wear Seat belts while driving.  Please note: You were cared for by a hospitalist during your hospital stay. Once you are discharged, your primary care physician will handle any further medical issues. Please note that NO REFILLS for any discharge medications will be authorized once you are discharged, as it is imperative that you return to your primary care physician (or establish a relationship with a primary care physician if you do not have one) for your post hospital discharge needs so that they can reassess your need for medications and monitor your lab values.   Increase activity slowly   Complete by: As directed       Allergies as of 04/02/2022       Reactions   Atorvastatin Other (See Comments)   myalgias   Tamiflu [oseltamivir Phosphate] Other  (See Comments)   Depressive symptoms        Medication List     STOP taking these medications    metFORMIN 500 MG 24 hr tablet Commonly known as: GLUCOPHAGE-XR   ondansetron 8 MG disintegrating tablet Commonly known as: ZOFRAN-ODT       TAKE these medications    Accu-Chek Guide test strip Generic drug: glucose blood USE UP TO 4 TIMES A DAY AS DIRECTED   Accu-Chek Softclix Lancets lancets USE UP TO 4 TIMES A DAY AS DIRECTED   Biktarvy 50-200-25 MG Tabs tablet Generic drug: bictegravir-emtricitabine-tenofovir AF TAKE 1 TABLET BY MOUTH DAILY.   carvedilol 6.25 MG tablet Commonly known as: COREG TAKE 1 TABLET BY MOUTH 2 TIMES DAILY WITH A MEAL.   clopidogrel 75 MG tablet Commonly known as: PLAVIX TAKE 1 TABLET BY MOUTH EVERY DAY   dapagliflozin propanediol 10 MG Tabs tablet Commonly known as: Farxiga Take 1 tablet (10 mg total) by mouth daily before breakfast.   fenofibrate 145 MG tablet Commonly known as: TRICOR Take 1 tablet (145 mg total) by mouth daily.   ferrous sulfate 324 MG Tbec Take 324 mg by mouth daily with breakfast.   fluticasone 50 MCG/ACT nasal spray Commonly known as:  FLONASE PLACE 1 SPRAY INTO BOTH NOSTRILS 2 (TWO) TIMES DAILY   lisinopril 20 MG tablet Commonly known as: ZESTRIL Take 1 tablet (20 mg total) by mouth daily.   nitroGLYCERIN 0.4 MG SL tablet Commonly known as: NITROSTAT PLACE 1 TABLET UNDER THE TONGUE EVERY 5 MINUTES FOR 3 DOSES AS NEEDED FOR CHEST PAIN   pantoprazole 40 MG tablet Commonly known as: PROTONIX Take 1 tablet (40 mg total) by mouth daily. For heartburn.   rosuvastatin 40 MG tablet Commonly known as: CRESTOR TAKE 1 TABLET BY MOUTH EVERY DAY   traMADol 50 MG tablet Commonly known as: ULTRAM Take 1 tablet (50 mg total) by mouth every 8 (eight) hours as needed for moderate pain.   VITAMIN B 12 PO Take 1 tablet by mouth 2 (two) times daily.        Follow-up Information     Pleas Koch, NP.  Schedule an appointment as soon as possible for a visit in 1 week(s).   Specialty: Internal Medicine Contact information: [place] [address] [phone]                Allergies  Allergen Reactions   Atorvastatin Other (See Comments)    myalgias   Tamiflu [Oseltamivir Phosphate] Other (See Comments)    Depressive symptoms     Other Procedures/Studies: ECHOCARDIOGRAM COMPLETE  Result Date: 03/31/2022    ECHOCARDIOGRAM REPORT   Patient Name:   Jacob Ali Date of Exam: 03/31/2022 Medical Rec #:  [id number]      Height:       69.0 in Accession #:    [id number]     Weight:       178.1 lb Date of Birth:  Apr 02, 1960     BSA:          1.967 m Patient Age:    28 years       BP:           145/69 mmHg Patient Gender: M              HR:           78 bpm. Exam Location:  Inpatient Procedure: Color Doppler, Cardiac Doppler and 2D Echo Indications:    Elevated Troponin  History:        Patient has prior history of Echocardiogram examinations, most                 recent 01/31/2021. CAD, HIV; Risk Factors:Hypertension and                 Diabetes.  Sonographer:    Marella Chimes Referring Phys: [place]  1. Left ventricular ejection fraction, by estimation, is 60 to 65%. The left ventricle has normal function. The left ventricle has no regional wall motion abnormalities. Left ventricular diastolic parameters are indeterminate.  2. Right ventricular systolic function is normal. The right ventricular size is normal.  3. The mitral valve is normal in structure. No evidence of mitral valve regurgitation. No evidence of mitral stenosis.  4. The aortic valve is tricuspid. Aortic valve regurgitation is not visualized. No aortic stenosis is present.  5. The inferior vena cava is normal in size with greater than 50% respiratory variability, suggesting right atrial pressure of 3 mmHg. Comparison(s): No significant change from prior study. Prior images reviewed side by side.  FINDINGS  Left Ventricle: Left ventricular ejection fraction, by estimation, is 60 to 65%. The left ventricle has normal  function. The left ventricle has no regional wall motion abnormalities. The left ventricular internal cavity size was normal in size. There is  no left ventricular hypertrophy. Left ventricular diastolic parameters are indeterminate. Right Ventricle: The right ventricular size is normal. No increase in right ventricular wall thickness. Right ventricular systolic function is normal. Left Atrium: Left atrial size was normal in size. Right Atrium: Right atrial size was normal in size. Pericardium: There is no evidence of pericardial effusion. Mitral Valve: The mitral valve is normal in structure. No evidence of mitral valve regurgitation. No evidence of mitral valve stenosis. Tricuspid Valve: The tricuspid valve is normal in structure. Tricuspid valve regurgitation is not demonstrated. No evidence of tricuspid stenosis. Aortic Valve: The aortic valve is tricuspid. Aortic valve regurgitation is not visualized. No aortic stenosis is present. Aortic valve mean gradient measures 6.0 mmHg. Aortic valve peak gradient measures 13.7 mmHg. Aortic valve area, by VTI measures 2.75  cm. Pulmonic Valve: The pulmonic valve was normal in structure. Pulmonic valve regurgitation is not visualized. No evidence of pulmonic stenosis. Aorta: The aortic root is normal in size and structure. Venous: The inferior vena cava is normal in size with greater than 50% respiratory variability, suggesting right atrial pressure of 3 mmHg. IAS/Shunts: No atrial level shunt detected by color flow Doppler.  LEFT VENTRICLE PLAX 2D LVIDd:         4.15 cm     Diastology LVIDs:         2.90 cm     LV e' medial:    8.05 cm/s LV PW:         1.05 cm     LV E/e' medial:  11.1 LV IVS:        1.05 cm     LV e' lateral:   8.81 cm/s LVOT diam:     2.10 cm     LV E/e' lateral: 10.2 LV SV:         93 LV SV Index:   47 LVOT Area:     3.46 cm  LV  Volumes (MOD) LV vol d, MOD A4C: 87.7 ml LV vol s, MOD A4C: 30.2 ml LV SV MOD A4C:     87.7 ml RIGHT VENTRICLE             IVC RV S prime:     15.70 cm/s  IVC diam: 2.40 cm TAPSE (M-mode): 2.2 cm LEFT ATRIUM             Index        RIGHT ATRIUM           Index LA Vol (A2C):   54.2 ml 27.56 ml/m  RA Area:     11.20 cm LA Vol (A4C):   48.2 ml 24.51 ml/m  RA Volume:   22.00 ml  11.19 ml/m LA Biplane Vol: 51.7 ml 26.29 ml/m  AORTIC VALVE AV Area (Vmax):    2.64 cm AV Area (Vmean):   2.81 cm AV Area (VTI):     2.75 cm AV Vmax:           185.00 cm/s AV Vmean:          112.000 cm/s AV VTI:            0.338 m AV Peak Grad:      13.7 mmHg AV Mean Grad:      6.0 mmHg LVOT Vmax:         141.00 cm/s LVOT Vmean:  90.900 cm/s LVOT VTI:          0.268 m LVOT/AV VTI ratio: 0.79  AORTA Ao Root diam: 2.80 cm Ao Asc diam:  2.90 cm MITRAL VALVE MV Area (PHT): 4.60 cm    SHUNTS MV Decel Time: 165 msec    Systemic VTI:  0.27 m MV E velocity: 89.60 cm/s  Systemic Diam: 2.10 cm MV A velocity: 90.70 cm/s MV E/A ratio:  0.99 Candee Furbish MD Electronically signed by Candee Furbish MD Signature Date/Time: 03/31/2022/11:02:28 AM    Final    CT ABDOMEN PELVIS WO CONTRAST  Result Date: 03/29/2022 CLINICAL DATA:  Abdominal pain worse today, diagnosed with norovirus, nausea, vomiting, diarrhea EXAM: CT ABDOMEN AND PELVIS WITHOUT CONTRAST TECHNIQUE: Multidetector CT imaging of the abdomen and pelvis was performed following the standard protocol without IV contrast. RADIATION DOSE REDUCTION: This exam was performed according to the departmental dose-optimization program which includes automated exposure control, adjustment of the mA and/or kV according to patient size and/or use of iterative reconstruction technique. COMPARISON:  Of 09/2017 FINDINGS: Lower chest: Lung bases clear Hepatobiliary: Dependent calcified gallstones within gallbladder. No gallbladder wall thickening or biliary dilatation. Liver unremarkable. Pancreas: Normal  appearance Spleen: Normal appearance.  Small splenule anterior to spleen. Adrenals/Urinary Tract: Adrenal glands normal appearance. Kidneys, ureters, and bladder unremarkable. Stomach/Bowel: Normal appendix. Descending and sigmoid diverticulosis. Stomach decompressed. Bowel loops otherwise unremarkable. Vascular/Lymphatic: Atherosclerotic calcifications aorta and iliac arteries without aneurysm. No adenopathy. Reproductive: Minimal prostatic enlargement. Other: No free air or free fluid. No definite hernia or inflammatory process. Musculoskeletal: Unremarkable IMPRESSION: Cholelithiasis without evidence of cholecystitis. Descending and sigmoid diverticulosis without evidence of diverticulitis. Minimal prostatic enlargement. No acute intra-abdominal or intrapelvic abnormalities. Aortic Atherosclerosis (ICD10-I70.0). Electronically Signed   By: Lavonia Dana M.D.   On: 03/29/2022 15:32     TODAY-DAY OF DISCHARGE:  Subjective:   Nicole Cella today has no headache,no chest abdominal pain,no new weakness tingling or numbness, feels much better wants to go home today.   Objective:   Blood pressure (!) 153/74, pulse 72, temperature 98.1 F (36.7 C), temperature source Oral, resp. rate 20, weight 80 kg, SpO2 94 %.  Intake/Output Summary (Last 24 hours) at 04/02/2022 0821 Last data filed at 04/02/2022 0516 Gross per 24 hour  Intake 250 ml  Output 1150 ml  Net -900 ml   Filed Weights   03/30/22 0500 04/01/22 0300 04/02/22 0300  Weight: 80.8 kg 81 kg 80 kg    Exam: Awake Alert, Oriented *3, No new F.N deficits, Normal affect [place].AT,PERRAL Supple Neck,No JVD, No cervical lymphadenopathy appriciated.  Symmetrical Chest wall movement, Good air movement bilaterally, CTAB RRR,No Gallops,Rubs or new Murmurs, No Parasternal Heave +ve B.Sounds, Abd Soft, Non tender, No organomegaly appriciated, No rebound -guarding or rigidity. No Cyanosis, Clubbing or edema, No new Rash or bruise   PERTINENT RADIOLOGIC  STUDIES: ECHOCARDIOGRAM COMPLETE  Result Date: 03/31/2022    ECHOCARDIOGRAM REPORT   Patient Name:   Jacob Ali Date of Exam: 03/31/2022 Medical Rec #:  [id number]      Height:       69.0 in Accession #:    [id number]     Weight:       178.1 lb Date of Birth:  [birth year]     BSA:          1.967 m Patient Age:    50 years       BP:           145/69 mmHg  Patient Gender: M              HR:           78 bpm. Exam Location:  Inpatient Procedure: Color Doppler, Cardiac Doppler and 2D Echo Indications:    Elevated Troponin  History:        Patient has prior history of Echocardiogram examinations, most                 recent 01/31/2021. CAD, HIV; Risk Factors:Hypertension and                 Diabetes.  Sonographer:    Marella Chimes Referring Phys: [place]  1. Left ventricular ejection fraction, by estimation, is 60 to 65%. The left ventricle has normal function. The left ventricle has no regional wall motion abnormalities. Left ventricular diastolic parameters are indeterminate.  2. Right ventricular systolic function is normal. The right ventricular size is normal.  3. The mitral valve is normal in structure. No evidence of mitral valve regurgitation. No evidence of mitral stenosis.  4. The aortic valve is tricuspid. Aortic valve regurgitation is not visualized. No aortic stenosis is present.  5. The inferior vena cava is normal in size with greater than 50% respiratory variability, suggesting right atrial pressure of 3 mmHg. Comparison(s): No significant change from prior study. Prior images reviewed side by side. FINDINGS  Left Ventricle: Left ventricular ejection fraction, by estimation, is 60 to 65%. The left ventricle has normal function. The left ventricle has no regional wall motion abnormalities. The left ventricular internal cavity size was normal in size. There is  no left ventricular hypertrophy. Left ventricular diastolic parameters are indeterminate. Right Ventricle: The right  ventricular size is normal. No increase in right ventricular wall thickness. Right ventricular systolic function is normal. Left Atrium: Left atrial size was normal in size. Right Atrium: Right atrial size was normal in size. Pericardium: There is no evidence of pericardial effusion. Mitral Valve: The mitral valve is normal in structure. No evidence of mitral valve regurgitation. No evidence of mitral valve stenosis. Tricuspid Valve: The tricuspid valve is normal in structure. Tricuspid valve regurgitation is not demonstrated. No evidence of tricuspid stenosis. Aortic Valve: The aortic valve is tricuspid. Aortic valve regurgitation is not visualized. No aortic stenosis is present. Aortic valve mean gradient measures 6.0 mmHg. Aortic valve peak gradient measures 13.7 mmHg. Aortic valve area, by VTI measures 2.75  cm. Pulmonic Valve: The pulmonic valve was normal in structure. Pulmonic valve regurgitation is not visualized. No evidence of pulmonic stenosis. Aorta: The aortic root is normal in size and structure. Venous: The inferior vena cava is normal in size with greater than 50% respiratory variability, suggesting right atrial pressure of 3 mmHg. IAS/Shunts: No atrial level shunt detected by color flow Doppler.  LEFT VENTRICLE PLAX 2D LVIDd:         4.15 cm     Diastology LVIDs:         2.90 cm     LV e' medial:    8.05 cm/s LV PW:         1.05 cm     LV E/e' medial:  11.1 LV IVS:        1.05 cm     LV e' lateral:   8.81 cm/s LVOT diam:     2.10 cm     LV E/e' lateral: 10.2 LV SV:         93 LV SV Index:  47 LVOT Area:     3.46 cm  LV Volumes (MOD) LV vol d, MOD A4C: 87.7 ml LV vol s, MOD A4C: 30.2 ml LV SV MOD A4C:     87.7 ml RIGHT VENTRICLE             IVC RV S prime:     15.70 cm/s  IVC diam: 2.40 cm TAPSE (M-mode): 2.2 cm LEFT ATRIUM             Index        RIGHT ATRIUM           Index LA Vol (A2C):   54.2 ml 27.56 ml/m  RA Area:     11.20 cm LA Vol (A4C):   48.2 ml 24.51 ml/m  RA Volume:   22.00 ml   11.19 ml/m LA Biplane Vol: 51.7 ml 26.29 ml/m  AORTIC VALVE AV Area (Vmax):    2.64 cm AV Area (Vmean):   2.81 cm AV Area (VTI):     2.75 cm AV Vmax:           185.00 cm/s AV Vmean:          112.000 cm/s AV VTI:            0.338 m AV Peak Grad:      13.7 mmHg AV Mean Grad:      6.0 mmHg LVOT Vmax:         141.00 cm/s LVOT Vmean:        90.900 cm/s LVOT VTI:          0.268 m LVOT/AV VTI ratio: 0.79  AORTA Ao Root diam: 2.80 cm Ao Asc diam:  2.90 cm MITRAL VALVE MV Area (PHT): 4.60 cm    SHUNTS MV Decel Time: 165 msec    Systemic VTI:  0.27 m MV E velocity: 89.60 cm/s  Systemic Diam: 2.10 cm MV A velocity: 90.70 cm/s MV E/A ratio:  0.99 Candee Furbish MD Electronically signed by Candee Furbish MD Signature Date/Time: 03/31/2022/11:02:28 AM    Final      PERTINENT LAB RESULTS: CBC: Recent Labs    04/01/22 0306 04/02/22 0322  WBC 5.7 7.2  HGB 12.1* 12.8*  HCT 36.1* 36.3*  PLT 195 191   CMET CMP     Component Value Date/Time   NA 136 04/02/2022 0322   NA 141 11/07/2021 1554   K 4.1 04/02/2022 0322   CL 104 04/02/2022 0322   CO2 23 04/02/2022 0322   GLUCOSE 125 (H) 04/02/2022 0322   BUN 19 04/02/2022 0322   BUN 14 11/07/2021 1554   CREATININE 1.08 04/02/2022 0322   CREATININE 0.99 03/01/2022 1449   CALCIUM 9.0 04/02/2022 0322   PROT 8.1 03/29/2022 1411   PROT 6.5 01/02/2022 0726   ALBUMIN 3.5 03/31/2022 0410   ALBUMIN 4.6 01/02/2022 0726   AST 31 03/29/2022 1411   ALT 22 03/29/2022 1411   ALKPHOS 35 (L) 03/29/2022 1411   BILITOT 0.5 03/29/2022 1411   BILITOT 0.3 01/02/2022 0726   GFRNONAA >60 04/02/2022 0322   GFRNONAA >60 10/16/2010 1641   GFRAA >60 05/22/2017 1222   GFRAA >60 10/16/2010 1641    GFR Estimated Creatinine Clearance: 70 mL/min (by C-G formula based on SCr of 1.08 mg/dL). No results for input(s): "LIPASE", "AMYLASE" in the last 72 hours. No results for input(s): "CKTOTAL", "CKMB", "CKMBINDEX", "TROPONINI" in the last 72 hours. Invalid input(s): "POCBNP" No  results for input(s): "DDIMER" in the last 72 hours.  No results for input(s): "HGBA1C" in the last 72 hours. No results for input(s): "CHOL", "HDL", "LDLCALC", "TRIG", "CHOLHDL", "LDLDIRECT" in the last 72 hours. No results for input(s): "TSH", "T4TOTAL", "T3FREE", "THYROIDAB" in the last 72 hours.  Invalid input(s): "FREET3" No results for input(s): "VITAMINB12", "FOLATE", "FERRITIN", "TIBC", "IRON", "RETICCTPCT" in the last 72 hours. Coags: No results for input(s): "INR" in the last 72 hours.  Invalid input(s): "PT" Microbiology: Recent Results (from the past 240 hour(s))  Resp panel by RT-PCR (RSV, Flu A&B, Covid) Anterior Nasal Swab     Status: None   Collection Time: 03/29/22  5:10 PM   Specimen: Anterior Nasal Swab  Result Value Ref Range Status   SARS Coronavirus 2 by RT PCR NEGATIVE NEGATIVE Final    Comment: (NOTE) SARS-CoV-2 target nucleic acids are NOT DETECTED.  The SARS-CoV-2 RNA is generally detectable in upper respiratory specimens during the acute phase of infection. The lowest concentration of SARS-CoV-2 viral copies this assay can detect is 138 copies/mL. A negative result does not preclude SARS-Cov-2 infection and should not be used as the sole basis for treatment or other patient management decisions. A negative result may occur with  improper specimen collection/handling, submission of specimen other than nasopharyngeal swab, presence of viral mutation(s) within the areas targeted by this assay, and inadequate number of viral copies(<138 copies/mL). A negative result must be combined with clinical observations, patient history, and epidemiological information. The expected result is Negative.  Fact Sheet for Patients:  EntrepreneurPulse.com.au  Fact Sheet for Healthcare Providers:  IncredibleEmployment.be  This test is no t yet approved or cleared by the [place] FDA and  has been authorized for detection and/or  diagnosis of SARS-CoV-2 by FDA under an Emergency Use Authorization (EUA). This EUA will remain  in effect (meaning this test can be used) for the duration of the COVID-19 declaration under Section 564(b)(1) of the Act, 21 U.S.C.section 360bbb-3(b)(1), unless the authorization is terminated  or revoked sooner.       Influenza A by PCR NEGATIVE NEGATIVE Final   Influenza B by PCR NEGATIVE NEGATIVE Final    Comment: (NOTE) The Xpert Xpress SARS-CoV-2/FLU/RSV plus assay is intended as an aid in the diagnosis of influenza from Nasopharyngeal swab specimens and should not be used as a sole basis for treatment. Nasal washings and aspirates are unacceptable for Xpert Xpress SARS-CoV-2/FLU/RSV testing.  Fact Sheet for Patients: EntrepreneurPulse.com.au  Fact Sheet for Healthcare Providers: IncredibleEmployment.be  This test is not yet approved or cleared by the [place] FDA and has been authorized for detection and/or diagnosis of SARS-CoV-2 by FDA under an Emergency Use Authorization (EUA). This EUA will remain in effect (meaning this test can be used) for the duration of the COVID-19 declaration under Section 564(b)(1) of the Act, 21 U.S.C. section 360bbb-3(b)(1), unless the authorization is terminated or revoked.     Resp Syncytial Virus by PCR NEGATIVE NEGATIVE Final    Comment: (NOTE) Fact Sheet for Patients: EntrepreneurPulse.com.au  Fact Sheet for Healthcare Providers: IncredibleEmployment.be  This test is not yet approved or cleared by the [place] FDA and has been authorized for detection and/or diagnosis of SARS-CoV-2 by FDA under an Emergency Use Authorization (EUA). This EUA will remain in effect (meaning this test can be used) for the duration of the COVID-19 declaration under Section 564(b)(1) of the Act, 21 U.S.C. section 360bbb-3(b)(1), unless the authorization is terminated  or revoked.  Performed at [facility], [address], [place], [place] [address]  Gastrointestinal Panel by PCR , Stool     Status: Abnormal   Collection Time: 03/30/22  6:13 PM   Specimen: STOOL  Result Value Ref Range Status   Campylobacter species NOT DETECTED NOT DETECTED Final   Plesimonas shigelloides NOT DETECTED NOT DETECTED Final   Salmonella species NOT DETECTED NOT DETECTED Final   Yersinia enterocolitica NOT DETECTED NOT DETECTED Final   Vibrio species NOT DETECTED NOT DETECTED Final   Vibrio cholerae NOT DETECTED NOT DETECTED Final   Enteroaggregative E coli (EAEC) NOT DETECTED NOT DETECTED Final   Enteropathogenic E coli (EPEC) NOT DETECTED NOT DETECTED Final   Enterotoxigenic E coli (ETEC) NOT DETECTED NOT DETECTED Final   Shiga like toxin producing E coli (STEC) NOT DETECTED NOT DETECTED Final   Shigella/Enteroinvasive E coli (EIEC) NOT DETECTED NOT DETECTED Final   Cryptosporidium NOT DETECTED NOT DETECTED Final   Cyclospora cayetanensis NOT DETECTED NOT DETECTED Final   Entamoeba histolytica NOT DETECTED NOT DETECTED Final   Giardia lamblia NOT DETECTED NOT DETECTED Final   Adenovirus F40/41 NOT DETECTED NOT DETECTED Final   Astrovirus NOT DETECTED NOT DETECTED Final   Norovirus GI/GII NOT DETECTED NOT DETECTED Final   Rotavirus A DETECTED (A) NOT DETECTED Final   Sapovirus (I, II, IV, and V) NOT DETECTED NOT DETECTED Final    Comment: Performed at [facility], [address] [place], [place] [address]  C Difficile Quick Screen w PCR reflex     Status: None   Collection Time: 03/30/22  6:13 PM   Specimen: STOOL  Result Value Ref Range Status   C Diff antigen NEGATIVE NEGATIVE Final   C Diff toxin NEGATIVE NEGATIVE Final   C Diff interpretation No C. difficile detected.  Final    Comment: Performed at [facility], [place] [address], [place], [place] [address]    FURTHER DISCHARGE INSTRUCTIONS:  Get Medicines reviewed  and adjusted: Please take all your medications with you for your next visit with your Primary MD  Laboratory/radiological data: Please request your Primary MD to go over all hospital tests and procedure/radiological results at the follow up, please ask your Primary MD to get all Hospital records sent to his/her office.  In some cases, they will be blood work, cultures and biopsy results pending at the time of your discharge. Please request that your primary care M.D. goes through all the records of your hospital data and follows up on these results.  Also Note the following: If you experience worsening of your admission symptoms, develop shortness of breath, life threatening emergency, suicidal or homicidal thoughts you must seek medical attention immediately by calling 911 or calling your MD immediately  if symptoms less severe.  You must read complete instructions/literature along with all the possible adverse reactions/side effects for all the Medicines you take and that have been prescribed to you. Take any new Medicines after you have completely understood and accpet all the possible adverse reactions/side effects.   Do not drive when taking Pain medications or sleeping medications (Benzodaizepines)  Do not take more than prescribed Pain, Sleep and Anxiety Medications. It is not advisable to combine anxiety,sleep and pain medications without talking with your primary care practitioner  Special Instructions: If you have smoked or chewed Tobacco  in the last 2 yrs please stop smoking, stop any regular Alcohol  and or any Recreational drug use.  Wear Seat belts while driving.  Please note: You were cared for by a hospitalist during your hospital  stay. Once you are discharged, your primary care physician will handle any further medical issues. Please note that NO REFILLS for any discharge medications will be authorized once you are discharged, as it is imperative that you return to your  primary care physician (or establish a relationship with a primary care physician if you do not have one) for your post hospital discharge needs so that they can reassess your need for medications and monitor your lab values.  Total Time spent coordinating discharge including counseling, education and face to face time equals greater than 30 minutes.  Signed: Dak Szumski 04/02/2022 8:21 AM

## 2022-04-02 NOTE — Progress Notes (Signed)
ANTICOAGULATION CONSULT NOTE - Follow Up Consult  Pharmacy Consult for heparin Indication: chest pain/ACS  Labs: Recent Labs     0000 03/31/22 0405 03/31/22 0410 03/31/22 0949 03/31/22 1536 04/01/22 0306 04/01/22 1125 04/01/22 2013 04/02/22 0322  HGB   < >  --  11.7*  --   --  12.1*  --   --  12.8*  HCT  --   --  34.4*  --   --  36.1*  --   --  36.3*  PLT  --   --  178  --   --  195  --   --  191  HEPARINUNFRC  --   --   --   --    < > 0.11* 0.15* 0.26* 0.37  CREATININE  --   --  1.44*  --   --  0.85  --   --  1.08  TROPONINIHS  --  901*  --  693*  --   --   --   --   --    < > = values in this interval not displayed.    Assessment/Plan:  62yo male therapeutic on heparin after rate change. Will continue infusion at current rate of 1750 units/hr with plan to stop after 48h (~10a today).   Wynona Neat, PharmD, BCPS  04/02/2022,4:09 AM

## 2022-04-03 ENCOUNTER — Telehealth: Payer: Self-pay

## 2022-04-03 ENCOUNTER — Telehealth: Payer: Self-pay | Admitting: Registered Nurse

## 2022-04-03 ENCOUNTER — Encounter: Payer: Self-pay | Admitting: Registered Nurse

## 2022-04-03 DIAGNOSIS — N179 Acute kidney failure, unspecified: Secondary | ICD-10-CM

## 2022-04-03 DIAGNOSIS — E1110 Type 2 diabetes mellitus with ketoacidosis without coma: Secondary | ICD-10-CM

## 2022-04-03 DIAGNOSIS — D649 Anemia, unspecified: Secondary | ICD-10-CM

## 2022-04-03 NOTE — Transitions of Care (Post Inpatient/ED Visit) (Unsigned)
   04/03/2022  Name: Jacob Ali MRN: [id number] DOB: 1960/05/28  Today's TOC FU Call Status: Today's TOC FU Call Status:: Unsuccessul Call (1st Attempt) Unsuccessful Call (1st Attempt) Date: 04/03/22  Attempted to reach the patient regarding the most recent Inpatient/ED visit.  Follow Up Plan: Additional outreach attempts will be made to reach the patient to complete the Transitions of Care (Post Inpatient/ED visit) call.   [place] LPN [place] Advisor Direct Dial [phone]

## 2022-04-03 NOTE — Telephone Encounter (Signed)
Patient returned call stated that he worked for 4 hours today and is going home due to fatigue.  Has not scheduled appt with PCM Clark yet.  He did schedule follow up with cardiologist 2 April regarding elevated troponin level while in hospital after ARF diagnosed.  He would like RN Kimrey to schedule his labs so he does not need to drive and miss time from work next week and to send him message with time for next Wednesday.  Reminded patient these are non fasting.  He stated all diarrhea has stopped.  Tolerating po intake without difficulty.  Discussed CBC/RBC slightly lower but most likely related to hydration for acute renal failure.  He stated he is taking his iron and B12 supplements prescribed by NP Loletta Specter also.  He is no longer taking NSAID for shoulder pain and he has follow up for anemia planned with his PCM.  Patient A&Ox3 spoke full sentences without difficulty agreed with plan of care and will follow up with him again tomorrow onsite at work.  RN Kimrey notified to schedule appt labs ordered for nonfasting next week Wednesday for patient.

## 2022-04-04 ENCOUNTER — Other Ambulatory Visit (HOSPITAL_COMMUNITY): Payer: Self-pay

## 2022-04-04 NOTE — Transitions of Care (Post Inpatient/ED Visit) (Signed)
   04/04/2022  Name: Jacob Ali MRN: [id number] DOB: 1960/10/20  Today's TOC FU Call Status: Today's TOC FU Call Status:: Successful TOC FU Call Competed Unsuccessful Call (1st Attempt) Date: 04/03/22 [facility] FU Call Complete Date: 04/04/22  Transition Care Management Follow-up Telephone Call Date of Discharge: 04/02/22 [facility]charge [facility]: [facility]) Type of Discharge: Inpatient Admission Primary Inpatient Discharge Diagnosis:: AKI How have you been since you were released from the hospital?: Better Any questions or concerns?: No  Items Reviewed: Did you receive and understand the discharge instructions provided?: Yes Medications obtained and verified?: Yes (Medications Reviewed) Any new allergies since your discharge?: No Dietary orders reviewed?: Yes Do you have support at home?: No  Home Care and Equipment/Supplies: [place]?: No Any new equipment or medical supplies ordered?: No  Functional Questionnaire: Do you need assistance with bathing/showering or dressing?: No Do you need assistance with meal preparation?: No Do you need assistance with eating?: No Do you have difficulty maintaining continence: No Do you need assistance with getting out of bed/getting out of a chair/moving?: No Do you have difficulty managing or taking your medications?: No  Follow up appointments reviewed: PCP Follow-up appointment confirmed?: Yes Date of PCP follow-up appointment?: 04/17/22 Follow-up Provider: Alma Friendly NP [facility] Follow-up appointment confirmed?: NA Do you need transportation to your follow-up appointment?: No Do you understand care options if your condition(s) worsen?: Yes-patient verbalized understanding    [place] LPN [place] Direct Dial [phone]

## 2022-04-06 ENCOUNTER — Other Ambulatory Visit: Payer: Self-pay | Admitting: Primary Care

## 2022-04-06 DIAGNOSIS — K219 Gastro-esophageal reflux disease without esophagitis: Secondary | ICD-10-CM

## 2022-04-06 DIAGNOSIS — H938X3 Other specified disorders of ear, bilateral: Secondary | ICD-10-CM

## 2022-04-08 NOTE — Telephone Encounter (Signed)
Patient at work today reports feeling much better almost 100%. Outside walking his dog. Patient scheduled to get follow up labs this week. Patient reports he is starting working 8 hrs this week.

## 2022-04-10 ENCOUNTER — Other Ambulatory Visit (HOSPITAL_COMMUNITY): Payer: Self-pay

## 2022-04-10 ENCOUNTER — Other Ambulatory Visit: Payer: No Typology Code available for payment source | Admitting: Occupational Medicine

## 2022-04-10 ENCOUNTER — Other Ambulatory Visit: Payer: Self-pay | Admitting: Infectious Diseases

## 2022-04-10 DIAGNOSIS — B20 Human immunodeficiency virus [HIV] disease: Secondary | ICD-10-CM

## 2022-04-10 DIAGNOSIS — N179 Acute kidney failure, unspecified: Secondary | ICD-10-CM

## 2022-04-10 MED ORDER — BIKTARVY 50-200-25 MG PO TABS
1.0000 | ORAL_TABLET | Freq: Every day | ORAL | 6 refills | Status: DC
  Filled 2022-04-10: qty 30, 30d supply, fill #0
  Filled 2022-05-21 (×2): qty 30, 30d supply, fill #1
  Filled 2022-06-24: qty 30, 30d supply, fill #2
  Filled 2022-07-16: qty 30, 30d supply, fill #3
  Filled 2022-09-05: qty 30, 30d supply, fill #4
  Filled 2022-10-01: qty 30, 30d supply, fill #5
  Filled 2022-10-30: qty 30, 30d supply, fill #6

## 2022-04-10 NOTE — Progress Notes (Signed)
Lab drawn from Right AC tolerated well no issues noted.   

## 2022-04-12 ENCOUNTER — Other Ambulatory Visit: Payer: Self-pay

## 2022-04-12 LAB — CBC WITH DIFFERENTIAL/PLATELET
Basophils Absolute: 0.1 10*3/uL (ref 0.0–0.2)
Basos: 1 %
EOS (ABSOLUTE): 0.1 10*3/uL (ref 0.0–0.4)
Eos: 2 %
Hematocrit: 36.8 % — ABNORMAL LOW (ref 37.5–51.0)
Hemoglobin: 12.4 g/dL — ABNORMAL LOW (ref 13.0–17.7)
Immature Grans (Abs): 0 10*3/uL (ref 0.0–0.1)
Immature Granulocytes: 1 %
Lymphocytes Absolute: 2.9 10*3/uL (ref 0.7–3.1)
Lymphs: 34 %
MCH: 33.7 pg — ABNORMAL HIGH (ref 26.6–33.0)
MCHC: 33.7 g/dL (ref 31.5–35.7)
MCV: 100 fL — ABNORMAL HIGH (ref 79–97)
Monocytes Absolute: 0.7 10*3/uL (ref 0.1–0.9)
Monocytes: 9 %
Neutrophils Absolute: 4.5 10*3/uL (ref 1.4–7.0)
Neutrophils: 53 %
Platelets: 318 10*3/uL (ref 150–450)
RBC: 3.68 x10E6/uL — ABNORMAL LOW (ref 4.14–5.80)
RDW: 11.7 % (ref 11.6–15.4)
WBC: 8.4 10*3/uL (ref 3.4–10.8)

## 2022-04-12 LAB — COMPREHENSIVE METABOLIC PANEL
ALT: 19 IU/L (ref 0–44)
AST: 17 IU/L (ref 0–40)
Albumin/Globulin Ratio: 2 (ref 1.2–2.2)
Albumin: 4.4 g/dL (ref 3.9–4.9)
Alkaline Phosphatase: 56 IU/L (ref 44–121)
BUN/Creatinine Ratio: 18 (ref 10–24)
BUN: 17 mg/dL (ref 8–27)
Bilirubin Total: 0.2 mg/dL (ref 0.0–1.2)
CO2: 18 mmol/L — ABNORMAL LOW (ref 20–29)
Calcium: 9.2 mg/dL (ref 8.6–10.2)
Chloride: 108 mmol/L — ABNORMAL HIGH (ref 96–106)
Creatinine, Ser: 0.94 mg/dL (ref 0.76–1.27)
Globulin, Total: 2.2 g/dL (ref 1.5–4.5)
Glucose: 125 mg/dL — ABNORMAL HIGH (ref 70–99)
Potassium: 4.6 mmol/L (ref 3.5–5.2)
Sodium: 140 mmol/L (ref 134–144)
Total Protein: 6.6 g/dL (ref 6.0–8.5)
eGFR: 91 mL/min/{1.73_m2} (ref >59)

## 2022-04-12 MED ORDER — CARVEDILOL 6.25 MG PO TABS: 6.2500 mg | ORAL_TABLET | Freq: Two times a day (BID) | ORAL | 3 refills | Status: DC

## 2022-04-12 NOTE — Addendum Note (Signed)
Addended by: Gerarda Fraction A on: 04/12/2022 09:25 AM   Modules accepted: Orders

## 2022-04-12 NOTE — Progress Notes (Signed)
My chart message sent to patient Jacob Ali had a delay in reporting all of your lab results they were finally available this am.  Your blood sugar elevated and higher than your usual.  Ensure you avoid dehydration consider walking the dog after meals and taking your medication on regular time schedule.  Avoid overindulging on sweets this holiday weekend.   Chloride slightly elevated and carbon dioxide slightly low (no changes to medication recommended).  Kidney/liver function tests normal.  Red blood cells still slightly low but stable.  I recommend recheck in 3 months and I put in an order.  Mild anemia continues and no infection and platelets normal.  Anemia can alter your Hgba1c results so next check ordered for 6 months.  Please let us know if you have further questions or concerns. Sincerely, Gerarda Fraction NP-C

## 2022-04-13 NOTE — Telephone Encounter (Signed)
Patient had follow up labs drawn this week see results note normal renal function  PCM appt pending.

## 2022-04-14 DIAGNOSIS — I21A1 Myocardial infarction type 2: Secondary | ICD-10-CM | POA: Insufficient documentation

## 2022-04-14 NOTE — Progress Notes (Unsigned)
Cardiology Office Note:    Date:  04/16/2022  ID:  [name], DOB 1960/09/30, MRN [id number] [facility] Cardiologist:  [name], MD [facility] Cardiologist:  Kathlyn [place], MD      Patient Profile:   Coronary artery disease [place] 01/30/21 s/p 2.75 x 15 mm DES to LAD Cath: D1 80, D2 70, pRCA 70 - managed medically TTE 01/31/21: EF 60-65, no RWMA, GR 1 DD, normal RVSF, normal PASP, trivial MR TTE 03/31/22: EF 60-65, no RWMA, NL RVSF, RAP 3  HIV Hypertension  Hyperlipidemia  Diabetes mellitus  Peripheral arterial disease  S/p atherectomy and stenting of the L SFA and popliteal arteries in 2018 Dr. Delana Meyer ([facility]) ABIs 04/09/21: R 0.88, L 0.72 +Cigs Sleep apnea Hx of cocaine use Aortic atherosclerosis  Hx of subdural hemorrhage in 03/2020 after a fall     History of Present Illness:   Jacob Ali is a 62 y.o. male who returns for post hospital follow up. He was last seen in clinic 11/09/21. He was admitted 3/15-3/19 with AKI (Creatinine up to 8) in the setting of gastroenteritis from Rotavirus. His course was c/p by chest pain and elevated hsTropinin felt to be c/w Type II MI. Echocardiogram showed normal EF without WMA. He was kept on IV heparin x 48 hours. No further cardiac testing was indicated. He is here alone today. He is feeling much better. He has not had any further chest pain, shortness of breath. He has not had syncope, leg edema.   Review of Systems  Gastrointestinal:  Negative for hematochezia.   See HPI    Studies Reviewed:    EKG:  Note done   Risk Assessment/Calculations:             Physical Exam:   VS:  BP 136/68   Pulse 88   Ht 5\' 9"  (1.753 m)   Wt 177 lb 9.6 oz (80.6 kg)   SpO2 93%   BMI 26.23 kg/m    Wt Readings from Last 3 Encounters:  04/16/22 177 lb 9.6 oz (80.6 kg)  04/02/22 176 lb 5.9 oz (80 kg)  03/01/22 184 lb (83.5 kg)    Constitutional:      Appearance: Healthy appearance. Not in distress.  Neck:      Vascular: JVD normal.  Pulmonary:     Breath sounds: Normal breath sounds. No wheezing. No rales.  Cardiovascular:     Normal rate. Regular rhythm. Normal S1. Normal S2.      Murmurs: There is no murmur.  Edema:    Peripheral edema absent.  Abdominal:     Palpations: Abdomen is soft.        ASSESSMENT AND PLAN:   Type 2 myocardial infarction [facility]) S/p DES to the LAD in 2023. He was recently admitted with AKI in the setting of Rotavirus infection c/b chest pain, elevated hsTroponins. EF was normal on echocardiogram and there were no WM abnormalities. The overall picture was c/w Type II MI and no further testing was indicated. He is now doing well w/o further anginal symptoms. Continue Plavix 75 mg once daily, Coreg 6.25 mg twice daily, Crestor 40 mg once daily. F/u in 6 mos.   Hyperlipidemia Triglycerides were high in Feb but he notes he was not fasting. His Triglycerides were optimal in Dec 2023. Overall, lipids are optimal. Continue Fenofibrate 145 mg once daily, Crestor 40 mg once daily.   Essential (primary) hypertension BP somewhat borderline. He may have an element  of white coat HTN. Continue Coreg 6.25 mg twice daily, Lisinopril 20 mg once daily. Monitor BP at home. If BP runs > 130/80, consider addition of amlodipine vs increasing Coreg dose.  Tobacco use disorder He is trying to quit.        Dispo:  Return in about 6 months (around 10/16/2022) for Routine Follow Up, w/ Dr. Johney Frame.  Signed, Richardson Dopp, PA-C

## 2022-04-15 ENCOUNTER — Other Ambulatory Visit: Payer: Self-pay

## 2022-04-16 ENCOUNTER — Encounter: Payer: Self-pay | Admitting: Physician Assistant

## 2022-04-16 ENCOUNTER — Ambulatory Visit: Payer: No Typology Code available for payment source | Attending: Physician Assistant | Admitting: Physician Assistant

## 2022-04-16 VITALS — BP 136/68 | HR 88 | Ht 69.0 in | Wt 177.6 lb

## 2022-04-16 DIAGNOSIS — E782 Mixed hyperlipidemia: Secondary | ICD-10-CM | POA: Diagnosis not present

## 2022-04-16 DIAGNOSIS — F17200 Nicotine dependence, unspecified, uncomplicated: Secondary | ICD-10-CM

## 2022-04-16 DIAGNOSIS — I10 Essential (primary) hypertension: Secondary | ICD-10-CM

## 2022-04-16 DIAGNOSIS — I21A1 Myocardial infarction type 2: Secondary | ICD-10-CM | POA: Diagnosis not present

## 2022-04-16 NOTE — Assessment & Plan Note (Signed)
BP somewhat borderline. He may have an element of white coat HTN. Continue Coreg 6.25 mg twice daily, Lisinopril 20 mg once daily. Monitor BP at home. If BP runs > 130/80, consider addition of amlodipine vs increasing Coreg dose.

## 2022-04-16 NOTE — Assessment & Plan Note (Signed)
Triglycerides were high in Feb but he notes he was not fasting. His Triglycerides were optimal in Dec 2023. Overall, lipids are optimal. Continue Fenofibrate 145 mg once daily, Crestor 40 mg once daily.

## 2022-04-16 NOTE — Patient Instructions (Signed)
Medication Instructions:  Your physician recommends that you continue on your current medications as directed. Please refer to the Current Medication list given to you today.  *If you need a refill on your cardiac medications before your next appointment, please call your pharmacy*   Lab Work: None ordered  If you have labs (blood work) drawn today and your tests are completely normal, you will receive your results only by: [place] (if you have MyChart) OR A paper copy in the mail If you have any lab test that is abnormal or we need to change your treatment, we will call you to review the results.   Testing/Procedures: None ordered   Follow-Up: At [facility], you and your health needs are our priority.  As part of our continuing mission to provide you with exceptional heart care, we have created designated Provider Care Teams.  These Care Teams include your primary Cardiologist (physician) and Advanced Practice Providers (APPs -  Physician Assistants and Nurse Practitioners) who all work together to provide you with the care you need, when you need it.  We recommend signing up for the patient portal called "MyChart".  Sign up information is provided on this After Visit Summary.  MyChart is used to connect with patients for Virtual Visits (Telemedicine).  Patients are able to view lab/test results, encounter notes, upcoming appointments, etc.  Non-urgent messages can be sent to your provider as well.   To learn more about what you can do with MyChart, go to [email or web address].    Your next appointment:   6 month(s)  Provider:   [name], MD     Other Instructions Your physician has requested that you regularly monitor and record your blood pressure readings at home. Please use the same machine at the same time of day to check your readings and record them to bring to your follow-up visit.   Please monitor blood pressures and keep a log of your  readings for 2 weeks then send in a mychart message.    Make sure to check 2 hours after your medications.    AVOID these things for 30 minutes before checking your blood pressure: No Drinking caffeine. No Drinking alcohol. No Eating. No Smoking. No Exercising.   Five minutes before checking your blood pressure: Pee. Sit in a dining chair. Avoid sitting in a soft couch or armchair. Be quiet. Do not talk

## 2022-04-16 NOTE — Assessment & Plan Note (Signed)
He is trying to quit.  

## 2022-04-16 NOTE — Assessment & Plan Note (Signed)
S/p DES to the LAD in 2023. He was recently admitted with AKI in the setting of Rotavirus infection c/b chest pain, elevated hsTroponins. EF was normal on echocardiogram and there were no WM abnormalities. The overall picture was c/w Type II MI and no further testing was indicated. He is now doing well w/o further anginal symptoms. Continue Plavix 75 mg once daily, Coreg 6.25 mg twice daily, Crestor 40 mg once daily. F/u in 6 mos.

## 2022-04-17 ENCOUNTER — Ambulatory Visit: Payer: No Typology Code available for payment source | Admitting: Primary Care

## 2022-04-17 ENCOUNTER — Encounter: Payer: Self-pay | Admitting: Primary Care

## 2022-04-17 VITALS — BP 128/72 | HR 82 | Temp 98.1°F | Ht 69.0 in | Wt 179.0 lb

## 2022-04-17 DIAGNOSIS — I10 Essential (primary) hypertension: Secondary | ICD-10-CM

## 2022-04-17 DIAGNOSIS — N179 Acute kidney failure, unspecified: Secondary | ICD-10-CM

## 2022-04-17 DIAGNOSIS — R197 Diarrhea, unspecified: Secondary | ICD-10-CM

## 2022-04-17 DIAGNOSIS — R112 Nausea with vomiting, unspecified: Secondary | ICD-10-CM

## 2022-04-17 DIAGNOSIS — E1165 Type 2 diabetes mellitus with hyperglycemia: Secondary | ICD-10-CM

## 2022-04-17 DIAGNOSIS — Z7984 Long term (current) use of oral hypoglycemic drugs: Secondary | ICD-10-CM

## 2022-04-17 NOTE — Progress Notes (Signed)
Subjective:    Patient ID: Jacob Ali, male    DOB: 1960/03/15, 63 y.o.   MRN: [id number]  HPI  Jacob Ali is a very pleasant 62 y.o. male with a history of HIV, type 2 diabetes, hypertension, CAD, acute renal failure, hyperlipidemia, who presents today for TCM hospital follow up.  He presented to [facility] ED on 03/29/22 for a three day history of intractable diarrhea with recurrent vomiting. He had been unable to take in liquids or food orally. During his ED stay he underwent CT abdomen/pelvis which was unremarkable. His labs revealed creatinine of 8.4 so he was treated with IV fluids with bicarb for hyperkalemia and admitted for further evaluation.   During his hospital stay he was found to have [place]. Nephrology consulted who recommended holding Farxiga, lisinopril. His metformin was discontinued. He was transitioned over to [facility] for IV fluids. Cardiology consulted for chest pain and elevated troponin levels. Troponin levels began trending downward. AKI improved with IV hydration and medication holds. IV Heparin was initiated x 48 hours. He was discharged home on 04/02/22. His lisinopril and Wilder Glade was resumed.   Since his hospital stay he saw his cardiologist yesterday. His typical medications were continued. No changes were made. He's feeling much better today. He's able to eat and drink fluids. He had 1 episode of diarrhea this morning for which he attributes to his recent increased intake of fiber. He has resumed his lisinopril and [place]. He has not taken his metformin. Overall, he's feeling much better.   BP Readings from Last 3 Encounters:  04/17/22 128/72  04/16/22 136/68  04/02/22 (!) 153/74      Review of Systems  Respiratory:  Negative for shortness of breath.   Cardiovascular:  Negative for chest pain.  Gastrointestinal:  Negative for abdominal pain, constipation, diarrhea, nausea and vomiting.         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   Depression    GERD (gastroesophageal reflux disease)    HIV infection    Hyperlipidemia    Hypertension    Peripheral vascular disease    PONV (postoperative nausea and vomiting)    S/P angioplasty with stent 01/30/21 to LAD  01/31/2021   Sleep apnea    Subdural hematoma 03/23/2020   Subdural hemorrhage    Tubular adenoma of colon 11/03/2017   Colonoscopy Sept 9, 2019    Social History   Socioeconomic History   Marital status: Single    Spouse name: Not on file   Number of children: Not on file   Years of education: 12   Highest education level: 12th grade  Occupational History   Occupation: Recruitment consultant: [employer]  Tobacco Use   Smoking status: Every Day    Types: Cigars   Smokeless tobacco: Never   Tobacco comments:    1/2PPD [name] was wearing a nicotine patch today and says he plans to quit smoking. Given the phone number to 1-800=quit-now   Vaping Use   Vaping Use: Former  Substance and Sexual Activity   Alcohol use: Yes    Alcohol/week: 2.0 standard drinks of alcohol    Types: 2 Standard drinks or equivalent per week    Comment: 1-2 times a week   Drug use: Not Currently    Types: Marijuana    Comment: 2 per month   Sexual activity: Not Currently  Partners: Male    Birth control/protection: Condom    Comment: pt. given condoms  Other Topics Concern   Not on file  Social History Narrative   Single, lives alone.     Social Determinants of Health   Financial Resource Strain: Medium Risk (04/11/2022)   Overall Financial Resource Strain (CARDIA)    Difficulty of Paying Living Expenses: Somewhat hard  Food Insecurity: No Food Insecurity (04/11/2022)   Hunger Vital Sign    Worried About Running Out of Food in the Last Year: Never true    Ran Out of Food in the Last Year: Never true  Transportation Needs: No Transportation Needs (04/11/2022)   PRAPARE - Armed forces logistics/support/administrative officer (Medical): No    Lack of Transportation (Non-Medical): No  Physical Activity: Insufficiently Active (04/11/2022)   Exercise Vital Sign    Days of Exercise per Week: 5 days    Minutes of Exercise per Session: 20 min  Stress: No Stress Concern Present (04/11/2022)   [place]    Feeling of Stress : Only a little  Social Connections: Socially Isolated (04/11/2022)   Social Connection and Isolation Panel [NHANES]    Frequency of Communication with Friends and Family: More than three times a week    Frequency of Social Gatherings with Friends and Family: More than three times a week    Attends Religious Services: Never    Marine scientist or Organizations: No    Attends Music therapist: Not on file    Marital Status: Never married  Intimate Partner Violence: Not At Risk (04/01/2022)   Humiliation, Afraid, Rape, and Kick questionnaire    Fear of Current or Ex-Partner: No    Emotionally Abused: No    Physically Abused: No    Sexually Abused: No    Past Surgical History:  Procedure Laterality Date   CARDIAC CATHETERIZATION     CORONARY STENT INTERVENTION N/A 01/30/2021   Procedure: CORONARY STENT INTERVENTION;  Surgeon: Belva Crome, MD;  Location: [place] [facility];  Service: Cardiovascular;  Laterality: N/A;   Examination under anesthesia, repair of anal fissure,  12/04/2000   LEFT HEART CATH AND CORONARY ANGIOGRAPHY N/A 01/30/2021   Procedure: LEFT HEART CATH AND CORONARY ANGIOGRAPHY;  Surgeon: Belva Crome, MD;  Location: [place] [facility];  Service: Cardiovascular;  Laterality: N/A;   PERIPHERAL VASCULAR CATHETERIZATION Left 02/06/2016   Procedure: Lower Extremity Angiography;  Surgeon: Katha Cabal, MD;  Location: [place] [facility];  Service: Cardiovascular;  Laterality: Left;   PERIPHERAL VASCULAR CATHETERIZATION N/A 02/06/2016   Procedure: Abdominal Aortogram  w/Lower Extremity;  Surgeon: Katha Cabal, MD;  Location: [place] [facility];  Service: Cardiovascular;  Laterality: N/A;   PERIPHERAL VASCULAR CATHETERIZATION  02/06/2016   Procedure: Lower Extremity Intervention;  Surgeon: Katha Cabal, MD;  Location: [place] [facility];  Service: Cardiovascular;;    Family History  Problem Relation Age of Onset   Hypertension Mother    Asthma Mother    Throat cancer Father    Heart disease Father    Cancer Father        bladder   Cancer Maternal Grandmother        colon    Allergies  Allergen Reactions   Atorvastatin Other (See Comments)    myalgias   Metformin And Related Other (See Comments)    Acute Kidney injury   Tamiflu [Oseltamivir Phosphate]  Other (See Comments)    Depressive symptoms    Current Outpatient Medications on File Prior to Visit  Medication Sig Dispense Refill   ACCU-CHEK GUIDE test strip USE UP TO 4 TIMES A DAY AS DIRECTED 300 strip 5   Accu-Chek Softclix Lancets lancets USE UP TO 4 TIMES A DAY AS DIRECTED 100 each 5   bictegravir-emtricitabine-tenofovir AF (BIKTARVY) 50-200-25 MG TABS tablet Take 1 tablet by mouth daily. 30 tablet 6   carvedilol (COREG) 6.25 MG tablet Take 1 tablet (6.25 mg total) by mouth 2 (two) times daily with a meal. 180 tablet 3   clopidogrel (PLAVIX) 75 MG tablet TAKE 1 TABLET BY MOUTH EVERY DAY 90 tablet 2   Cyanocobalamin (VITAMIN B 12 PO) Take 1 tablet by mouth daily at 12 noon.     dapagliflozin propanediol (FARXIGA) 10 MG TABS tablet Take 1 tablet (10 mg total) by mouth daily before breakfast. 30 tablet 6   fenofibrate (TRICOR) 145 MG tablet Take 1 tablet (145 mg total) by mouth daily. 90 tablet 3   ferrous sulfate 324 MG TBEC Take 324 mg by mouth daily with breakfast.     fluticasone (FLONASE) 50 MCG/ACT nasal spray PLACE 1 SPRAY INTO BOTH NOSTRILS 2 (TWO) TIMES DAILY 48 mL 0   lisinopril (ZESTRIL) 20 MG tablet Take 1 tablet (20 mg total) by mouth daily. 90 tablet 3    nitroGLYCERIN (NITROSTAT) 0.4 MG SL tablet PLACE 1 TABLET UNDER THE TONGUE EVERY 5 MINUTES FOR 3 DOSES AS NEEDED FOR CHEST PAIN 25 tablet 3   pantoprazole (PROTONIX) 40 MG tablet TAKE 1 TABLET (40 MG TOTAL) BY MOUTH DAILY. FOR HEARTBURN. 90 tablet 2   rosuvastatin (CRESTOR) 40 MG tablet TAKE 1 TABLET BY MOUTH EVERY DAY 90 tablet 3   No current facility-administered medications on file prior to visit.    BP 128/72   Pulse 82   Temp 98.1 F (36.7 C) (Temporal)   Ht 5\' 9"  (1.753 m)   Wt 179 lb (81.2 kg)   SpO2 94%   BMI 26.43 kg/m  Objective:   Physical Exam Cardiovascular:     Rate and Rhythm: Normal rate and regular rhythm.  Pulmonary:     Effort: Pulmonary effort is normal.     Breath sounds: Normal breath sounds. No wheezing or rales.  Abdominal:     General: Bowel sounds are normal.     Palpations: Abdomen is soft.     Tenderness: There is no abdominal tenderness.  Musculoskeletal:     Cervical back: Neck supple.  Skin:    General: Skin is warm and dry.  Neurological:     Mental Status: He is alert and oriented to person, place, and time.  Psychiatric:        Mood and Affect: Mood normal.           Assessment & Plan:  Nausea, vomiting, and diarrhea Assessment & Plan: Recent hospitalization. Hospital notes, labs, imaging reviewed.   Fortunately, he's feeling much better.  Remain off metformin.   Reviewed cardiology notes from yesterday.  Renal function within normal range prior to discharge.    Essential (primary) hypertension Assessment & Plan: Controlled.  Continue lisinopril 20 mg daily, Coreg 6.25 mg BID.   Type 2 diabetes mellitus with hyperglycemia, without long-term current use of insulin Assessment & Plan: Remain off metformin given AKI. Continue Farxiga 10 mg daily.  We will follow up in August.   Acute renal failure, unspecified acute renal failure type Assessment &  Plan: Recent hospitalization. Hospital notes, labs, imaging  reviewed.  Renal function within normal range prior to discharge.  Remain off metformin.          Pleas Koch, NP

## 2022-04-17 NOTE — Assessment & Plan Note (Signed)
Recent hospitalization. Hospital notes, labs, imaging reviewed.   Fortunately, he's feeling much better.  Remain off metformin.   Reviewed cardiology notes from yesterday.  Renal function within normal range prior to discharge.

## 2022-04-17 NOTE — Assessment & Plan Note (Signed)
Remain off metformin given AKI. Continue Farxiga 10 mg daily.  We will follow up in August.

## 2022-04-17 NOTE — Patient Instructions (Signed)
Remain off metformin. Continue Farxiga for diabetes.  Schedule a follow up visit for August.  It was a pleasure to see you today!

## 2022-04-17 NOTE — Assessment & Plan Note (Signed)
Recent hospitalization. Hospital notes, labs, imaging reviewed.  Renal function within normal range prior to discharge.  Remain off metformin.

## 2022-04-17 NOTE — Assessment & Plan Note (Signed)
Controlled.  Continue lisinopril 20 mg daily, Coreg 6.25 mg BID.

## 2022-05-01 NOTE — Telephone Encounter (Signed)
Patient had appt with [facility] 04/17/22

## 2022-05-20 ENCOUNTER — Encounter: Payer: Self-pay | Admitting: Cardiology

## 2022-05-20 ENCOUNTER — Telehealth: Payer: Self-pay

## 2022-05-20 ENCOUNTER — Other Ambulatory Visit (HOSPITAL_COMMUNITY): Payer: Self-pay

## 2022-05-20 MED ORDER — EMPAGLIFLOZIN 25 MG PO TABS: 25.0000 mg | ORAL_TABLET | Freq: Every day | ORAL | 4 refills | Status: DC

## 2022-05-20 NOTE — Telephone Encounter (Signed)
Per test claim:   The rx for farxiga 10mg  is a non-formulary drug/ benefit exclusion. Insurance wont cover this drug at all. Please see that an alternative medication is sent in as the patients preferred pharmacy has sent in a request stating/ asking for the same thing as well.

## 2022-05-20 NOTE — Telephone Encounter (Signed)
Called the pt back and informed him that we received a message from our pharmacy tech pool about his farxiga being a non-formulary drug/benefit exclusion, and they are suggesting he be switched to [place], which I got approved from Dr. Shari Prows for.    Informed him that per Dr. Shari Prows, we can switch him to jardiance 25 mg po daily.  Per the pt, he cannot afford Jardiance 25 mg either, for he just quit his job.  He states that he checked with his current insurance coverage (NOT [employer] anymore) and  jardiance would cost him about $140 a month, which he cannot afford, for he is now on a fixed income.    Pt states he would like to apply for pt assistance for farxiga or jardiance, and see if he could be approved for either.  He states he prefers staying on the farxiga if possible, for he is doing really well on this.  Provided the pt with the patient assistance number for farxiga, and to go ahead and call. He confirmed with me that he still has about a week and 1/2 worth of farxiga pills left, but will need samples thereafter, while applying for pt assistance.  Again, he states he prefers staying with farxiga, but if [place] Pepper is the only medication that he can get assistance with, then he will go with that medication plan.  Pt stated to me that he will send Dr. Shari Prows a mychart message with his new insurance coverage that he is using while he is unemployed, so that we can update this in the system, for Med[employer] is NO longer his carrier.   Informed the pt that I will route all of this to our patient assistance nurse Larita Fife Via, so that she can call him back tomorrow, to see if she could get him going for pt assistance for farxiga first, and if not that, then possibly jardiance.  Did advise him to go ahead and call the pt assistance number to get this started.   Pt is aware that Larita Fife will touch base with him tomorrow about all of this, and she can also help coordinate getting him additional samples, with  whichever medication she thinks he can get pt assistance with (farxiga or jardiance).   He is aware that I will be out of the office tomorrow.  Did advise him to go ahead and upload new insurance information to the portal so that we can update this in his chart.     Will also send this information to our PharmD pool and Dr. Shari Prows as an Lorain Childes.  Pt verbalized understanding and agrees with this plan.

## 2022-05-20 NOTE — Telephone Encounter (Signed)
Syan, Krass" - 05/20/2022  3:43 PM Meriam Sprague, MD  Sent: Mon May 20, 2022  4:07 PM  To: Loa Socks, LPN  Cc: [facility] Pharmd         Message  Okay to switch to jardiance 25mg  daily     Left the pt a message to call the office back to endorse to him  that his Marcelline Deist is not covered by his insurance and Dr. Shari Prows would like to switch him to Jardiance 25 mg po daily instead.   Will go ahead and discontinue the pts Farxiga and send in Jardiance 25 mg po daily before breakfast to his pharmacy on file. I am off tomorrow, so if he calls back I have instructed him on his voicemail to ask to speak with a triage nurse, so that they can get him samples of Jardiance for him to pick up from the office.

## 2022-05-21 ENCOUNTER — Telehealth: Payer: Self-pay

## 2022-05-21 ENCOUNTER — Other Ambulatory Visit (HOSPITAL_COMMUNITY): Payer: Self-pay

## 2022-05-21 NOTE — Telephone Encounter (Signed)
[facility] Patient Advocate Encounter   I was successful in securing patient a $5,000.00 grant from [employer] ([employer]) to provide copayment coverage for Biktarvy.  This will make the out of pocket cost $0.00.     I have spoken with the patient.    The billing information is as follows and has been shared with [facility].             Patient knows to call the office with questions or concerns.  Daiwik Buffalo, CPhT Specialty Pharmacy Patient Advocate [facility] Phone: [phone] Fax:  [phone]  

## 2022-05-21 NOTE — Telephone Encounter (Signed)
Pharmacy Patient Advocate Encounter   Received notification from [facility] that prior authorization for JARDIANCE is needed.    PA submitted on 05/21/22 Key [id number] Status is pending  Haze Rushing, CPhT Pharmacy Patient Advocate Specialist Direct Number: [phone] Fax: [phone]

## 2022-05-21 NOTE — Telephone Encounter (Signed)
Clarifying that he quit his job but does have other rx insurance? If that's the case, he should be able to use a copay card for whichever SGLT2i his new plan covers.

## 2022-05-21 NOTE — Telephone Encounter (Signed)
Yes - looks like Jacob Ali has submitted a PA. Once approved, pt will be able to use a copay card.

## 2022-05-22 ENCOUNTER — Other Ambulatory Visit (HOSPITAL_COMMUNITY): Payer: Self-pay

## 2022-05-22 ENCOUNTER — Other Ambulatory Visit: Payer: Self-pay

## 2022-05-22 NOTE — Telephone Encounter (Signed)
Jacob Ali, [facility]      05/21/22 11:41 AM Note Pharmacy Patient Advocate Encounter   Received notification from [facility] that prior authorization for JARDIANCE is needed.    PA submitted on 05/21/22 Key [id number] Status is pending   Jacob Ali, CPhT Pharmacy Patient Advocate Specialist Direct Number: [phone] Fax: [phone]

## 2022-05-22 NOTE — Telephone Encounter (Signed)
Pharmacy Patient Advocate Encounter  Prior Authorization for [facility] has been approved.    Effective dates: 05/21/22 through 05/21/23  Haze Rushing, CPhT Pharmacy Patient Advocate Specialist Direct Number: [phone] Fax: [phone]

## 2022-05-22 NOTE — Telephone Encounter (Signed)
Yes ma'am, he's good to go (see separate encounter for updates on determination). Per test claim the pharmacy filled for patient on 05/20/22. He should have been able to pick up already.

## 2022-05-22 NOTE — Telephone Encounter (Signed)
Haze Rushing, [employer] at 05/22/2022  8:28 AM  Status: Signed  Yes ma'am, he's good to go (see separate encounter for updates on determination). Per test claim the pharmacy filled for patient on 05/20/22. He should have been able to pick up already.

## 2022-05-22 NOTE — Telephone Encounter (Signed)
Left a detailed message on the pts confirmed voicemail that his PA for Jardiance was approved for the next year, as indicated in this note.   Advised him on his voicemail that this medication is available to pick up at his pharmacy, if he hasn't done this already.   Advised him on his voicemail  to call the office back with any additional questions or concerns regarding this matter.

## 2022-06-17 ENCOUNTER — Encounter: Payer: Self-pay | Admitting: Emergency Medicine

## 2022-06-19 ENCOUNTER — Other Ambulatory Visit (HOSPITAL_COMMUNITY): Payer: Self-pay

## 2022-06-24 ENCOUNTER — Other Ambulatory Visit: Payer: Self-pay

## 2022-06-24 ENCOUNTER — Other Ambulatory Visit (HOSPITAL_COMMUNITY): Payer: Self-pay

## 2022-07-01 ENCOUNTER — Other Ambulatory Visit: Payer: Self-pay

## 2022-07-01 MED ORDER — LISINOPRIL 20 MG PO TABS: 20.0000 mg | ORAL_TABLET | Freq: Every day | ORAL | 3 refills | Status: DC

## 2022-07-10 ENCOUNTER — Other Ambulatory Visit: Payer: Self-pay | Admitting: Occupational Medicine

## 2022-07-11 ENCOUNTER — Other Ambulatory Visit: Payer: Self-pay

## 2022-07-11 ENCOUNTER — Telehealth: Payer: Self-pay | Admitting: Primary Care

## 2022-07-11 NOTE — Telephone Encounter (Signed)
Contacted pt to r/s appt on 8/6 for Jacob Ali. Pt mentioned he has now retired & prefers a office closer to his home. Pt requested to be transferred to the [facility], under Vickie Henson's care. Pt stated he will miss Jacob Ali & loved the care she provided for him but he prefers to be closer to home. Is this TOC okay? Call back # [phone]

## 2022-07-11 NOTE — Telephone Encounter (Signed)
No worries! Thanks! Jiles Prows, are you able to take on the pt as TOC?

## 2022-07-12 NOTE — Telephone Encounter (Signed)
Scheduled appt for 8/29 

## 2022-07-15 DIAGNOSIS — M1990 Unspecified osteoarthritis, unspecified site: Secondary | ICD-10-CM | POA: Diagnosis not present

## 2022-07-15 DIAGNOSIS — K219 Gastro-esophageal reflux disease without esophagitis: Secondary | ICD-10-CM | POA: Diagnosis not present

## 2022-07-15 DIAGNOSIS — I110 Hypertensive heart disease with heart failure: Secondary | ICD-10-CM | POA: Diagnosis not present

## 2022-07-15 DIAGNOSIS — Z79899 Other long term (current) drug therapy: Secondary | ICD-10-CM | POA: Diagnosis not present

## 2022-07-15 DIAGNOSIS — B20 Human immunodeficiency virus [HIV] disease: Secondary | ICD-10-CM | POA: Diagnosis not present

## 2022-07-15 DIAGNOSIS — Z7902 Long term (current) use of antithrombotics/antiplatelets: Secondary | ICD-10-CM | POA: Diagnosis not present

## 2022-07-15 DIAGNOSIS — I25119 Atherosclerotic heart disease of native coronary artery with unspecified angina pectoris: Secondary | ICD-10-CM | POA: Diagnosis not present

## 2022-07-15 DIAGNOSIS — I509 Heart failure, unspecified: Secondary | ICD-10-CM | POA: Diagnosis not present

## 2022-07-15 DIAGNOSIS — Z95820 Peripheral vascular angioplasty status with implants and grafts: Secondary | ICD-10-CM | POA: Diagnosis not present

## 2022-07-15 DIAGNOSIS — E785 Hyperlipidemia, unspecified: Secondary | ICD-10-CM | POA: Diagnosis not present

## 2022-07-15 DIAGNOSIS — E1151 Type 2 diabetes mellitus with diabetic peripheral angiopathy without gangrene: Secondary | ICD-10-CM | POA: Diagnosis not present

## 2022-07-15 DIAGNOSIS — F17210 Nicotine dependence, cigarettes, uncomplicated: Secondary | ICD-10-CM | POA: Diagnosis not present

## 2022-07-16 ENCOUNTER — Other Ambulatory Visit: Payer: Self-pay

## 2022-07-24 ENCOUNTER — Other Ambulatory Visit (HOSPITAL_COMMUNITY): Payer: Self-pay

## 2022-07-24 MED ORDER — ROSUVASTATIN CALCIUM 40 MG PO TABS: 40.0000 mg | ORAL_TABLET | Freq: Every day | ORAL | 2 refills | Status: DC

## 2022-07-25 ENCOUNTER — Other Ambulatory Visit (HOSPITAL_COMMUNITY): Payer: Self-pay

## 2022-08-05 ENCOUNTER — Other Ambulatory Visit (HOSPITAL_COMMUNITY): Payer: Self-pay

## 2022-08-05 ENCOUNTER — Telehealth: Payer: Self-pay

## 2022-08-05 NOTE — Telephone Encounter (Signed)
[facility] Patient Advocate Encounter  Patient was approved through PAF (Patient [employer]) but needs a signature from the Dr Ninetta Lights for CPR Diagnosis Verification Form.  I faxed form to [facility] to have Dr Ninetta Lights sign and then fax back to [phone]. (The number is on the patient form).  Patient came to [facility] we gave him 2 week samples of Biktarvy until the paper work is completed.  Clearance Coots, CPhT Specialty Pharmacy Patient [facility] Phone: [phone] Fax:  [phone]

## 2022-08-06 ENCOUNTER — Other Ambulatory Visit (HOSPITAL_COMMUNITY): Payer: Self-pay

## 2022-08-07 ENCOUNTER — Other Ambulatory Visit (HOSPITAL_COMMUNITY): Payer: Self-pay

## 2022-08-08 ENCOUNTER — Other Ambulatory Visit: Payer: Self-pay | Admitting: Pharmacist

## 2022-08-08 DIAGNOSIS — B20 Human immunodeficiency virus [HIV] disease: Secondary | ICD-10-CM

## 2022-08-08 MED ORDER — BIKTARVY 50-200-25 MG PO TABS: 1.0000 | ORAL_TABLET | Freq: Every day | ORAL | Status: AC

## 2022-08-08 NOTE — Progress Notes (Signed)
Medication Samples have been provided to the patient.  Drug name: Biktarvy        Strength: 50/200/25 mg       Qty: 14 tablets (2 bottles)   LOT: [id number]   Exp.Date: 08/2024  Dosing instructions: Take one tablet by mouth once daily  The patient has been instructed regarding the correct time, dose, and frequency of taking this medication, including desired effects and most common side effects.   Wende Longstreth L. Jannette Fogo, PharmD, BCIDP, AAHIVP, CPP Clinical Pharmacist Practitioner Infectious Diseases Clinical Pharmacist [facility] 12/27/2019, 10:07 AM

## 2022-08-09 ENCOUNTER — Other Ambulatory Visit: Payer: Self-pay

## 2022-08-20 ENCOUNTER — Ambulatory Visit: Payer: No Typology Code available for payment source | Admitting: Primary Care

## 2022-09-05 ENCOUNTER — Other Ambulatory Visit (HOSPITAL_COMMUNITY): Payer: Self-pay

## 2022-09-09 ENCOUNTER — Other Ambulatory Visit (HOSPITAL_COMMUNITY): Payer: Self-pay

## 2022-09-10 ENCOUNTER — Other Ambulatory Visit (HOSPITAL_COMMUNITY): Payer: Self-pay

## 2022-09-12 ENCOUNTER — Ambulatory Visit (INDEPENDENT_AMBULATORY_CARE_PROVIDER_SITE_OTHER): Payer: 59 | Admitting: Nurse Practitioner

## 2022-09-12 VITALS — BP 118/64 | HR 77 | Temp 98.5°F | Ht 69.0 in | Wt 182.2 lb

## 2022-09-12 DIAGNOSIS — E663 Overweight: Secondary | ICD-10-CM

## 2022-09-12 DIAGNOSIS — E785 Hyperlipidemia, unspecified: Secondary | ICD-10-CM | POA: Diagnosis not present

## 2022-09-12 DIAGNOSIS — E1169 Type 2 diabetes mellitus with other specified complication: Secondary | ICD-10-CM | POA: Diagnosis not present

## 2022-09-12 DIAGNOSIS — Z23 Encounter for immunization: Secondary | ICD-10-CM | POA: Diagnosis not present

## 2022-09-12 DIAGNOSIS — F411 Generalized anxiety disorder: Secondary | ICD-10-CM

## 2022-09-12 DIAGNOSIS — Z7984 Long term (current) use of oral hypoglycemic drugs: Secondary | ICD-10-CM | POA: Diagnosis not present

## 2022-09-12 DIAGNOSIS — F5102 Adjustment insomnia: Secondary | ICD-10-CM

## 2022-09-12 DIAGNOSIS — I10 Essential (primary) hypertension: Secondary | ICD-10-CM

## 2022-09-12 DIAGNOSIS — Z1211 Encounter for screening for malignant neoplasm of colon: Secondary | ICD-10-CM

## 2022-09-12 NOTE — Progress Notes (Signed)
Established Patient Office Visit  Subjective   Patient ID: Jacob Ali, male    DOB: [birth year]  Age: 62 y.o. MRN: [id number]  Chief Complaint  Patient presents with   Insomnia    Patient arrives today to transfer care.  He reports that he used to work here where his last PCP was located, but he has since retired and is establishing here because he lives closer to this office.  He has a past medical history positive for depression with history of suicide attempt back in 2013 which she reports is related to a stressful situation, GERD, sleep apnea, HIV, hyperlipidemia, hypertension, coronary artery disease, peripheral vascular disease.  He was hospitalized about 6 months ago for acute kidney injury secondary to rotavirus.  He reports that his main concern today is getting updated labs as well as insomnia.  He reports that he was somewhat forced into retirement which has resulted in some anxiety regarding his finances.  He reports that he is able to fall asleep, but often wakes up between 1 to 3 AM and has a hard time falling back asleep.  He denies any suicidal ideation today.    ROS: see HPI    Objective:     BP 118/64   Pulse 77   Temp 98.5 F (36.9 C) (Temporal)   Ht 5\' 9"  (1.753 m)   Wt 182 lb 4 oz (82.7 kg)   SpO2 92%   BMI 26.91 kg/m    Physical Exam Vitals reviewed.  Constitutional:      Appearance: Normal appearance.  HENT:     Head: Normocephalic and atraumatic.  Cardiovascular:     Rate and Rhythm: Normal rate and regular rhythm.  Pulmonary:     Effort: Pulmonary effort is normal.     Breath sounds: Normal breath sounds.  Musculoskeletal:     Cervical back: Neck supple.  Skin:    General: Skin is warm and dry.  Neurological:     Mental Status: He is alert and oriented to person, place, and time.  Psychiatric:        Mood and Affect: Mood normal.        Behavior: Behavior normal.        Thought Content: Thought content normal.        Judgment:  Judgment normal.         03/01/2022    2:12 PM 11/14/2021    3:56 PM 06/08/2021    1:42 PM  PHQ9 SCORE ONLY  PHQ-9 Total Score 1 0 3      03/01/2022    2:12 PM  GAD 7 : Generalized Anxiety Score  Nervous, Anxious, on Edge 0  Control/stop worrying 0  Worry too much - different things 0  Trouble relaxing 0  Restless 0  Easily annoyed or irritable 0  Afraid - awful might happen 0  Total GAD 7 Score 0  Anxiety Difficulty Not difficult at all      No results found for any visits on 09/12/22.    The ASCVD Risk score (Arnett DK, et al., 2019) failed to calculate for the following reasons:   The patient has a prior MI or stroke diagnosis    Assessment & Plan:   Problem List Items Addressed This Visit       Cardiovascular and Mediastinum   Essential (primary) hypertension    Chronic, controlled Continue carvedilol 6.25 mg twice a day and lisinopril 20 mg daily      Relevant Orders  CBC   Comprehensive metabolic panel   Hemoglobin A1c   Lipid panel   TSH   Microalbumin / creatinine urine ratio     Endocrine   Type 2 diabetes mellitus (HCC)    Chronic A1c ordered for evaluation, further recommendations may be made based upon the results. In the meantime continue Jardiance 25 mg daily as well as lisinopril and rosuvastatin.      Relevant Orders   CBC   Comprehensive metabolic panel   Hemoglobin A1c   Lipid panel   TSH   Microalbumin / creatinine urine ratio     Other   Hyperlipidemia (Chronic)    Chronic Continue fenofibrate 145 mg daily and rosuvastatin 40 mg daily. Patient to return to office tomorrow in fasting state for fasting labs, further recommendations may be made based on these results      Relevant Orders   CBC   Comprehensive metabolic panel   Hemoglobin A1c   Lipid panel   TSH   Microalbumin / creatinine urine ratio   Insomnia - Primary (Chronic)    Chronic, slightly increased with recent retirement Mainly affecting his ability to  sleep Will consider initiating him on as needed trazodone once labs result.   Patient warned of risk of suicidal ideation a side effect of trazodone  Follow-up in 1 month      Relevant Orders   CBC   Comprehensive metabolic panel   Hemoglobin A1c   Lipid panel   TSH   Microalbumin / creatinine urine ratio   Generalized anxiety disorder    Chronic, slightly increased with recent retirement Mainly affecting his ability to sleep Will consider initiating him on as needed trazodone once labs result.   Patient warned of risk of suicidal ideation a side effect of trazodone  Follow-up in 1 month      Relevant Orders   CBC   Comprehensive metabolic panel   Hemoglobin A1c   Lipid panel   TSH   Microalbumin / creatinine urine ratio   Colon cancer screening    Referral to GI made today      Relevant Orders   Ambulatory referral to Gastroenterology   Microalbumin / creatinine urine ratio   Overweight    Labs ordered, further recommendations may be made based upon these results       Relevant Orders   CBC   Comprehensive metabolic panel   Hemoglobin A1c   Lipid panel   TSH   Microalbumin / creatinine urine ratio   Needs flu shot    Flu shot ministered today, VIS provided       Return in about 1 month (around 10/13/2022) for F/U with Maralyn Sago.    Elenore Paddy, NP

## 2022-09-12 NOTE — Addendum Note (Signed)
Addended by: Cathleen Fears, Shaquel Chavous P on: 09/12/2022 11:46 AM   Modules accepted: Orders

## 2022-09-12 NOTE — Assessment & Plan Note (Signed)
Labs ordered, further recommendations may be made based upon these results. 

## 2022-09-12 NOTE — Assessment & Plan Note (Signed)
Chronic Continue fenofibrate 145 mg daily and rosuvastatin 40 mg daily. Patient to return to office tomorrow in fasting state for fasting labs, further recommendations may be made based on these results

## 2022-09-12 NOTE — Assessment & Plan Note (Signed)
Chronic, controlled Continue carvedilol 6.25 mg twice a day and lisinopril 20 mg daily

## 2022-09-12 NOTE — Assessment & Plan Note (Signed)
Referral to GI made today. 

## 2022-09-12 NOTE — Assessment & Plan Note (Signed)
Chronic, slightly increased with recent retirement Mainly affecting his ability to sleep Will consider initiating him on as needed trazodone once labs result.   Patient warned of risk of suicidal ideation a side effect of trazodone  Follow-up in 1 month

## 2022-09-12 NOTE — Assessment & Plan Note (Signed)
Chronic A1c ordered for evaluation, further recommendations may be made based upon the results. In the meantime continue Jardiance 25 mg daily as well as lisinopril and rosuvastatin.

## 2022-09-12 NOTE — Assessment & Plan Note (Signed)
Flu shot ministered today, VIS provided

## 2022-09-13 ENCOUNTER — Other Ambulatory Visit: Payer: No Typology Code available for payment source

## 2022-09-13 DIAGNOSIS — E1165 Type 2 diabetes mellitus with hyperglycemia: Secondary | ICD-10-CM | POA: Diagnosis not present

## 2022-09-13 LAB — COMPREHENSIVE METABOLIC PANEL
ALT: 21 U/L (ref 0–53)
AST: 21 U/L (ref 0–37)
Albumin: 4.8 g/dL (ref 3.5–5.2)
Alkaline Phosphatase: 40 U/L (ref 39–117)
BUN: 17 mg/dL (ref 6–23)
CO2: 23 mEq/L (ref 19–32)
Calcium: 9.9 mg/dL (ref 8.4–10.5)
Chloride: 108 mEq/L (ref 96–112)
Creatinine, Ser: 1.28 mg/dL (ref 0.40–1.50)
GFR: 59.45 mL/min — ABNORMAL LOW (ref >60.00)
Glucose, Bld: 120 mg/dL — ABNORMAL HIGH (ref 70–99)
Potassium: 4.6 mEq/L (ref 3.5–5.1)
Sodium: 139 mEq/L (ref 135–145)
Total Bilirubin: 0.8 mg/dL (ref 0.2–1.2)
Total Protein: 7.2 g/dL (ref 6.0–8.3)

## 2022-09-13 LAB — LIPID PANEL
Cholesterol: 101 mg/dL (ref 0–200)
HDL: 29.1 mg/dL — ABNORMAL LOW (ref >39.00)
LDL Cholesterol: 36 mg/dL (ref 0–99)
NonHDL: 71.43
Total CHOL/HDL Ratio: 3
Triglycerides: 175 mg/dL — ABNORMAL HIGH (ref 0.0–149.0)
VLDL: 35 mg/dL (ref 0.0–40.0)

## 2022-09-13 LAB — TSH: TSH: 1.68 u[IU]/mL (ref 0.35–5.50)

## 2022-09-13 LAB — CBC
HCT: 42.3 % (ref 39.0–52.0)
Hemoglobin: 14.1 g/dL (ref 13.0–17.0)
MCHC: 33.3 g/dL (ref 30.0–36.0)
MCV: 105.5 fl — ABNORMAL HIGH (ref 78.0–100.0)
Platelets: 209 10*3/uL (ref 150.0–400.0)
RBC: 4.01 Mil/uL — ABNORMAL LOW (ref 4.22–5.81)
RDW: 12.8 % (ref 11.5–15.5)
WBC: 9 10*3/uL (ref 4.0–10.5)

## 2022-09-13 LAB — MICROALBUMIN / CREATININE URINE RATIO
Creatinine,U: 92.7 mg/dL
Microalb Creat Ratio: 0.8 mg/g (ref 0.0–30.0)
Microalb, Ur: 0.7 mg/dL (ref 0.0–1.9)

## 2022-09-14 LAB — HEMOGLOBIN A1C
Hgb A1c MFr Bld: 4.2 %{Hb} (ref <5.7)
Mean Plasma Glucose: 74 mg/dL
eAG (mmol/L): 4.1 mmol/L

## 2022-09-15 IMAGING — CT CT ANGIO CHEST
2 of 6 series · 17 of 46 positions shown · IV contrast (APPLIED)
Comparison: None.

CLINICAL DATA: Acute aortic syndrome with chest pain radiating to
the back.

EXAM:
CT ANGIOGRAPHY CHEST WITH CONTRAST
TECHNIQUE: Multidetector CT imaging of the chest was performed using the
standard protocol during bolus administration of intravenous
contrast. Multiplanar CT image reconstructions and MIPs were
obtained to evaluate the vascular anatomy.

[Series 4: arterial · axial · arterial · 0.76mm/px · z∈[+24,+284]mm · 14 of 150 slices shown]
[im 10/150  lung]
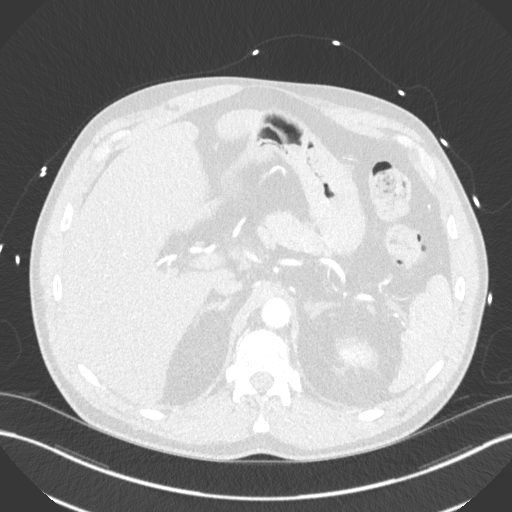
[im 20/150  soft-tissue]
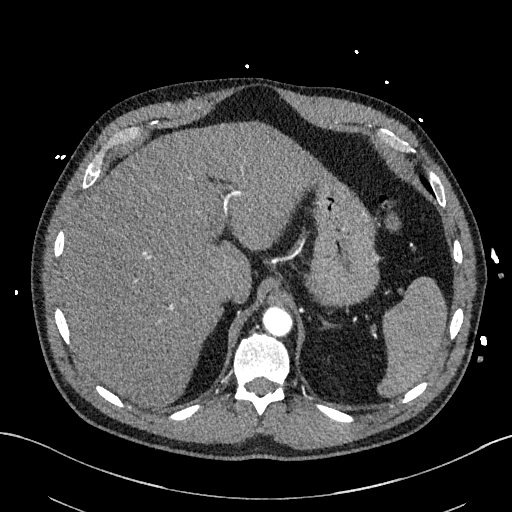
[im 30/150  lung]
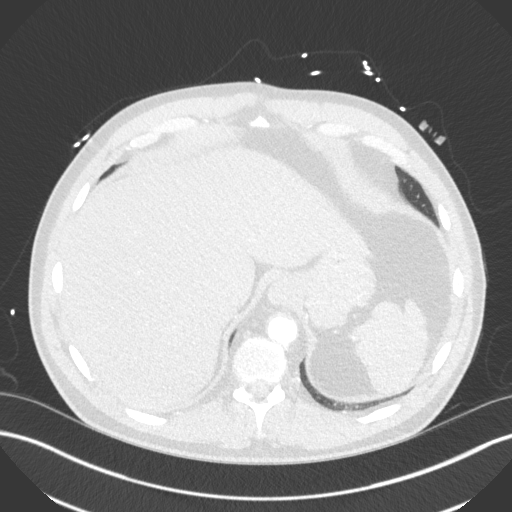
[im 40/150  soft-tissue]
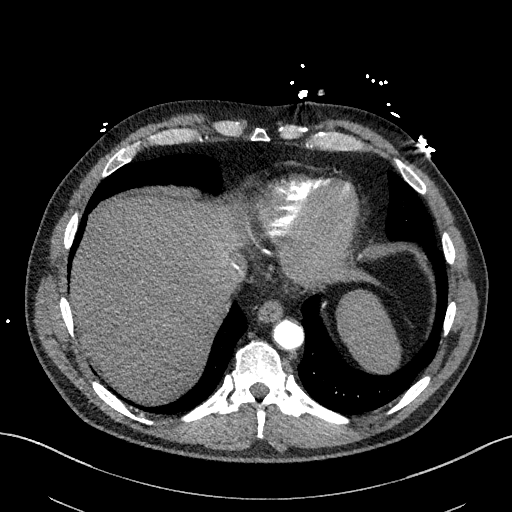
[im 50/150  lung]
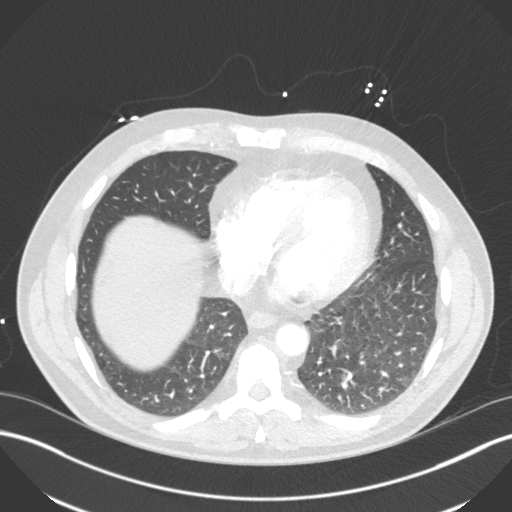
[im 60/150  soft-tissue]
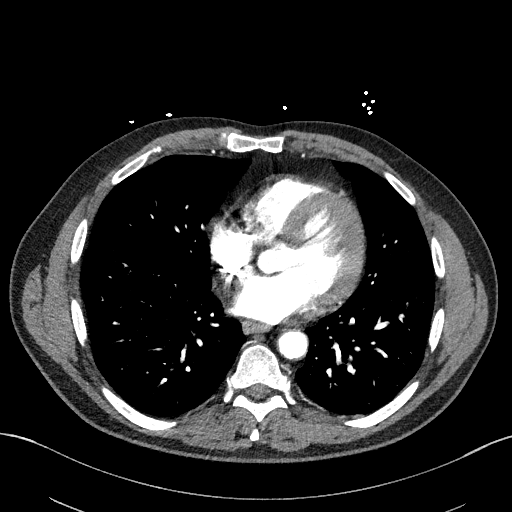
[im 70/150  lung]
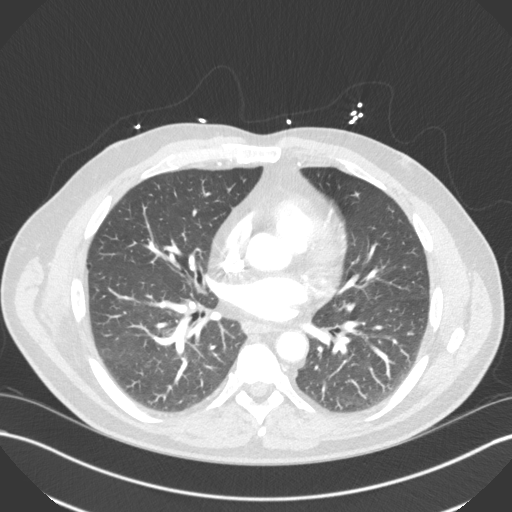
[im 80/150  soft-tissue]
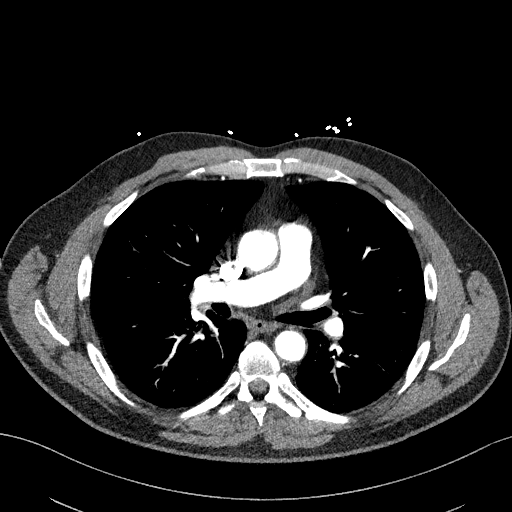
[im 90/150  lung]
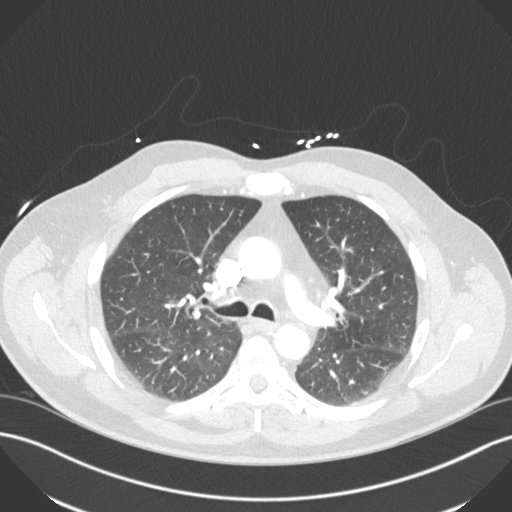
[im 100/150  soft-tissue]
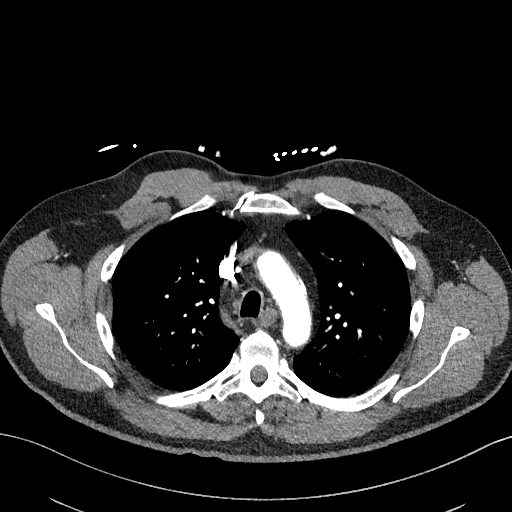
[im 110/150  lung]
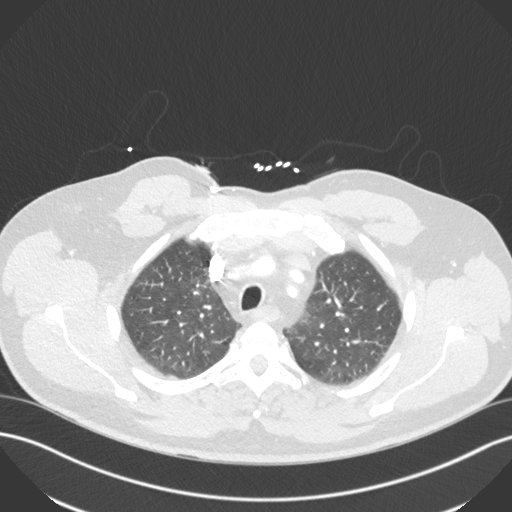
[im 120/150  soft-tissue]
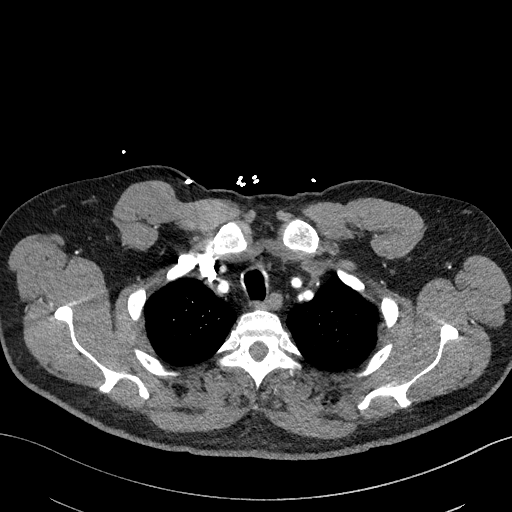
[im 130/150  lung]
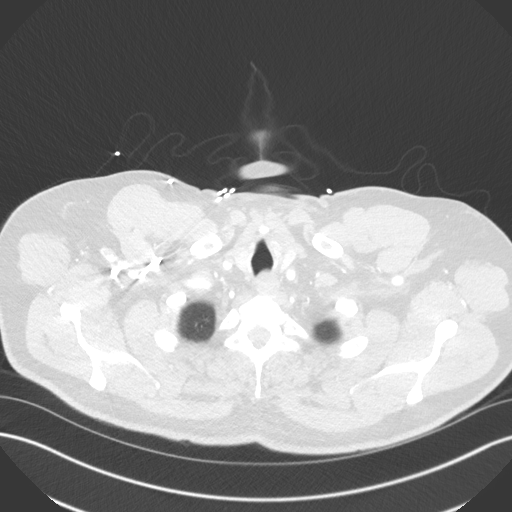
[im 140/150  soft-tissue]
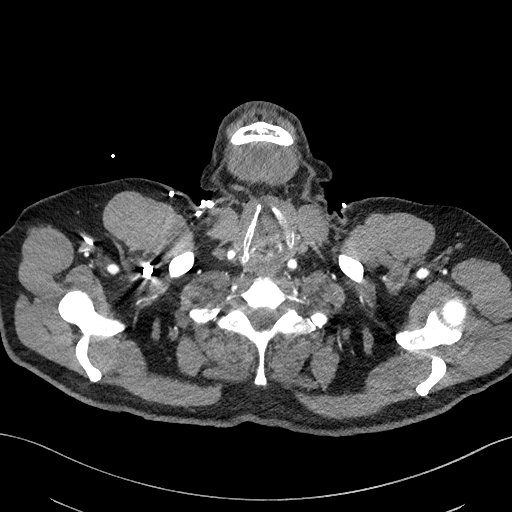

[Series 7: cor soft · coronal · 0.58mm/px · 3 of 131 slices shown]
[im 33/131  soft-tissue]
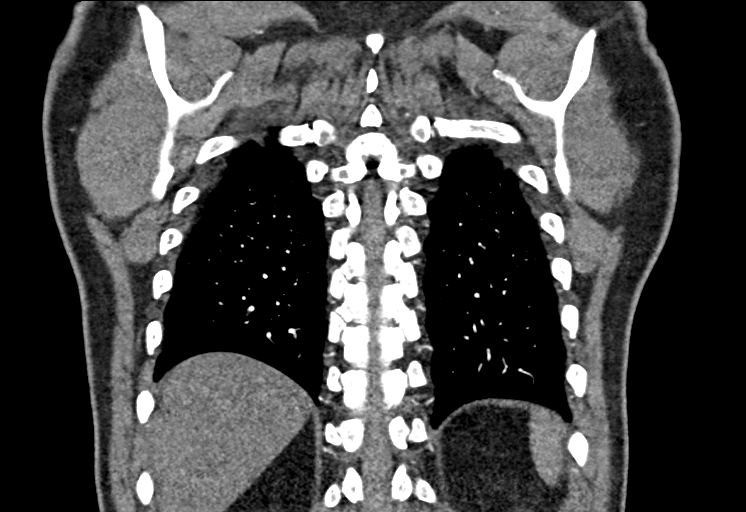
[im 66/131  soft-tissue]
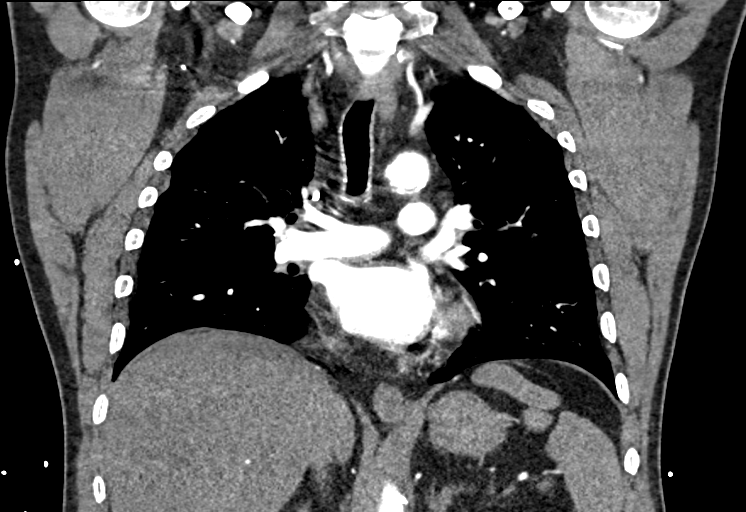
[im 98/131  soft-tissue]
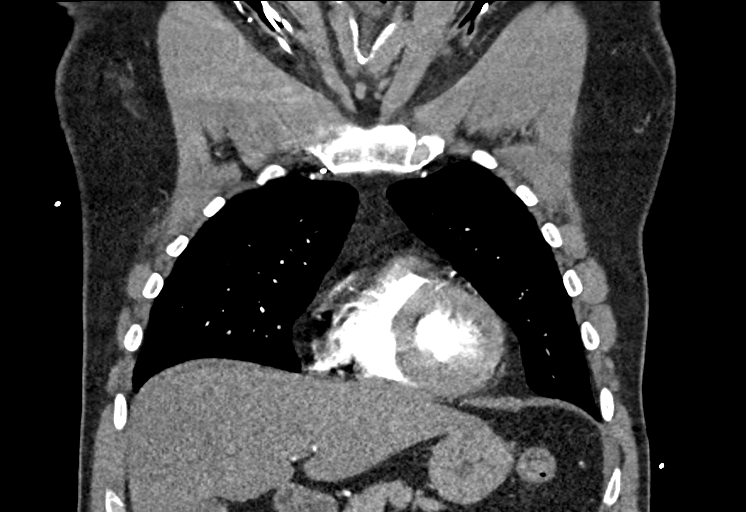

[17 of 46 positions shown; findings below may reference images not displayed]

RADIATION DOSE REDUCTION: This exam was performed according to the
departmental dose-optimization program which includes automated
exposure control, adjustment of the mA and/or kV according to
patient size and/or use of iterative reconstruction technique.

CONTRAST:  75mL OMNIPAQUE IOHEXOL 350 MG/ML SOLN
FINDINGS: Cardiovascular: 2 vessel aortic arch. The right brachiocephalic and
left common carotid artery share a common origin. Mild
atherosclerotic plaque in the aortic arch and proximal arch vessels.
No evidence of aneurysm or dissection. The main pulmonary artery is
normal in size. No evidence of pulmonary embolism. Scattered
calcifications visualized along the left anterior descending and
circumflex coronary arteries. The heart is normal in size. No
pericardial effusion.

Mediastinum/Nodes: Unremarkable CT appearance of the thyroid gland.
No suspicious mediastinal or hilar adenopathy. No soft tissue
mediastinal mass. The thoracic esophagus is unremarkable.

Lungs/Pleura: Lungs are clear. No pleural effusion or pneumothorax.

Upper Abdomen: No acute abnormality. Numerous calcified stones
visualized within the gallbladder lumen.

Musculoskeletal: No chest wall abnormality. No acute or significant
osseous findings.

Review of the MIP images confirms the above findings.
IMPRESSION: 1. Negative for aortic dissection, aneurysm or other acute vascular
abnormality.
2. Atherosclerotic calcifications are visualized along the aorta and
coronary arteries.
3. Cholelithiasis noted incidentally.

Aortic Atherosclerosis ([id number].X).

## 2022-09-15 IMAGING — DX DG CHEST 2V
2 series · 2 of 2 positions shown · non-contrast
Comparison: Chest x-ray dated April 07, 2015

CLINICAL DATA: Chest pain

EXAM:
CHEST - 2 VIEW

[chest pa]
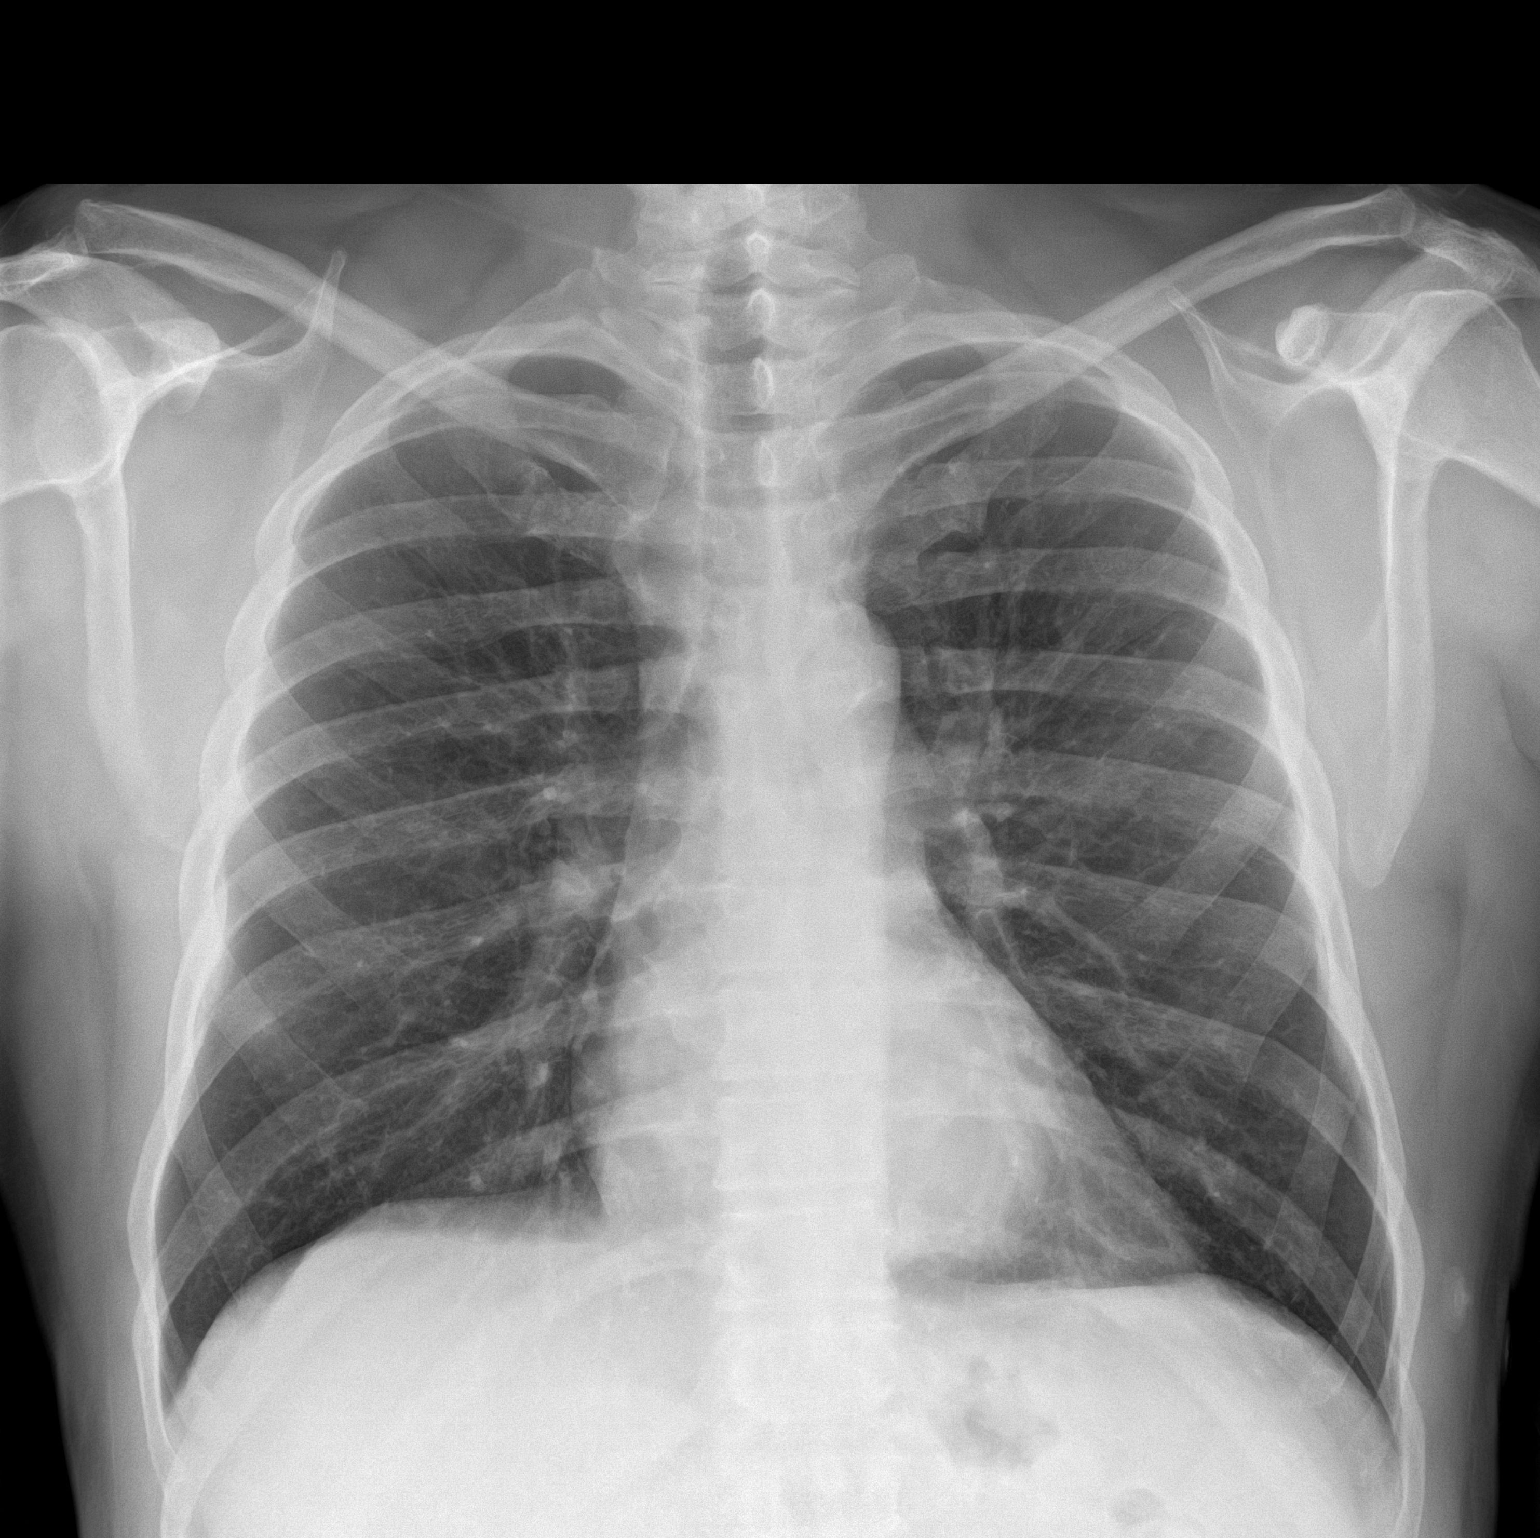

[chest lat]
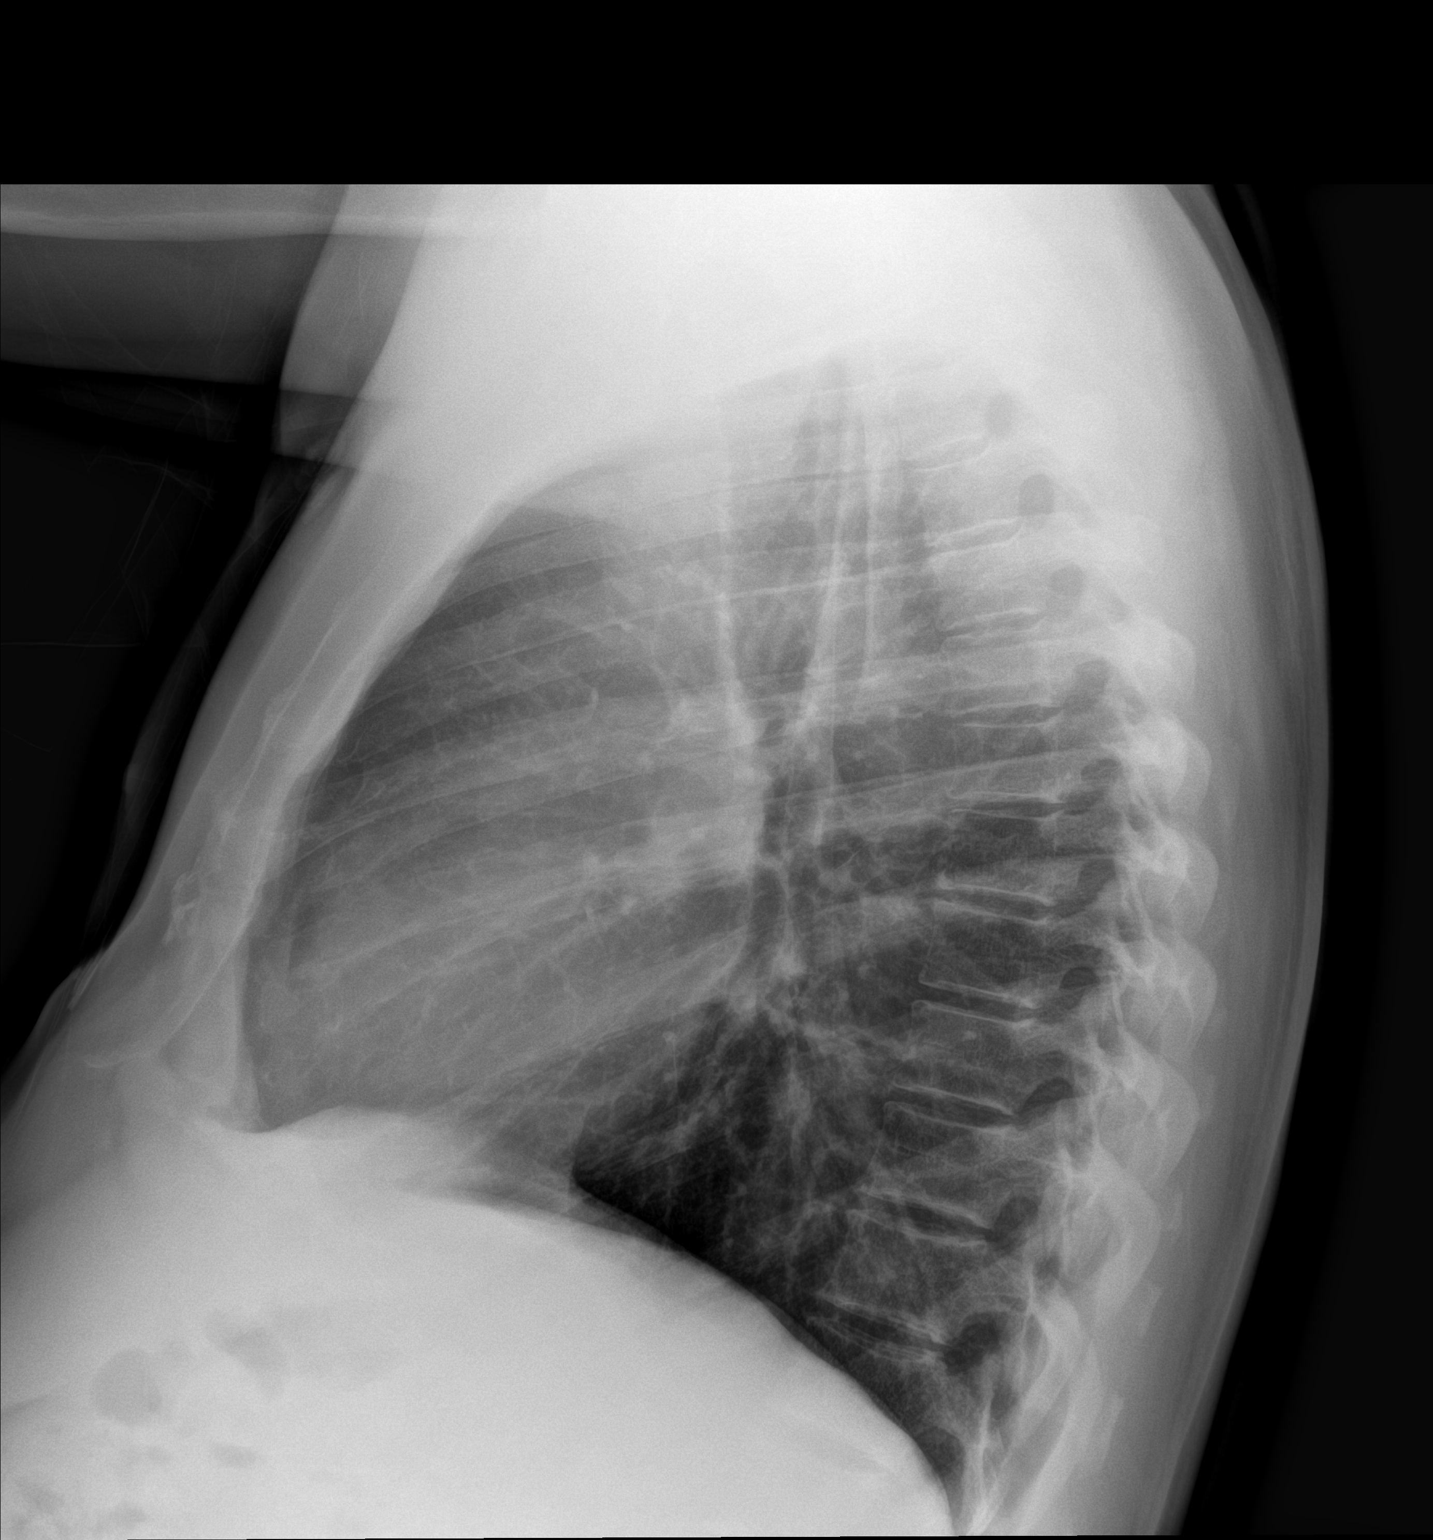

[2 of 2 positions shown; findings below may reference images not displayed]

FINDINGS: The heart size and mediastinal contours are within normal limits.
Both lungs are clear. The visualized skeletal structures are
unremarkable.
IMPRESSION: No active cardiopulmonary disease.

## 2022-09-19 ENCOUNTER — Other Ambulatory Visit: Payer: Self-pay | Admitting: Nurse Practitioner

## 2022-09-19 ENCOUNTER — Encounter: Payer: Self-pay | Admitting: Nurse Practitioner

## 2022-09-19 DIAGNOSIS — F5102 Adjustment insomnia: Secondary | ICD-10-CM

## 2022-09-19 MED ORDER — TRAZODONE HCL 50 MG PO TABS: 25.0000 mg | ORAL_TABLET | Freq: Every evening | ORAL | 0 refills | Status: DC | PRN

## 2022-09-25 ENCOUNTER — Other Ambulatory Visit (INDEPENDENT_AMBULATORY_CARE_PROVIDER_SITE_OTHER): Payer: Self-pay | Admitting: Nurse Practitioner

## 2022-09-25 NOTE — Telephone Encounter (Signed)
Patient needs to make an appointment as soon as possible

## 2022-10-01 ENCOUNTER — Other Ambulatory Visit (HOSPITAL_COMMUNITY): Payer: Self-pay

## 2022-10-05 ENCOUNTER — Other Ambulatory Visit: Payer: Self-pay | Admitting: Nurse Practitioner

## 2022-10-05 ENCOUNTER — Encounter (HOSPITAL_COMMUNITY): Payer: Self-pay

## 2022-10-09 ENCOUNTER — Other Ambulatory Visit: Payer: Self-pay | Admitting: Occupational Medicine

## 2022-10-09 ENCOUNTER — Other Ambulatory Visit: Payer: Self-pay | Admitting: Primary Care

## 2022-10-09 DIAGNOSIS — K219 Gastro-esophageal reflux disease without esophagitis: Secondary | ICD-10-CM

## 2022-10-11 ENCOUNTER — Ambulatory Visit: Payer: No Typology Code available for payment source | Admitting: Nurse Practitioner

## 2022-10-12 ENCOUNTER — Other Ambulatory Visit: Payer: Self-pay | Admitting: Nurse Practitioner

## 2022-10-12 DIAGNOSIS — F5102 Adjustment insomnia: Secondary | ICD-10-CM

## 2022-10-14 NOTE — Progress Notes (Unsigned)
Cardiology Office Note:    Date:  10/14/2022  ID:  [name], DOB 08/30/1960, MRN [id number] PCP: Elenore Paddy, NP  [facility] Providers Cardiologist:  Meriam Sprague, MD PV Cardiologist:  Lorine Bears, MD { Click to update primary MD,subspecialty MD or APP then REFRESH:1}    {Click to Open Review  :1}   Patient Profile:      Coronary artery disease [place] 01/30/21 s/p 2.75 x 15 mm DES to LAD Cath: D1 80, D2 70, pRCA 70 - managed medically TTE 01/31/21: EF 60-65, no RWMA, GR 1 DD, normal RVSF, normal PASP, trivial MR TTE 03/31/22: EF 60-65, no RWMA, NL RVSF, RAP 3  HIV Hypertension  Hyperlipidemia  Diabetes mellitus  Peripheral arterial disease  S/p atherectomy and stenting of the L SFA and popliteal arteries in 2018 Dr. Gilda Crease ([facility]) ABIs 04/09/21: R 0.88, L 0.72 +Cigs Sleep apnea Hx of cocaine use Aortic atherosclerosis  Hx of subdural hemorrhage in 03/2020 after a fall       {      :1}   History of Present Illness:  Discussed the use of AI scribe software for clinical note transcription with the patient, who gave verbal consent to proceed.  Jacob Ali is a 63 y.o. male who returns for follow up of coronary artery disease. He was last seen in 04/2022 after an admission for gastroenteritis (Rotavirus) c/b AKI and elevated hsT c/w Type II MI. ***         ROS   See HPI ***    Studies Reviewed:       *** Risk Assessment/Calculations:   {Does this patient have ATRIAL FIBRILLATION?:[phone]} No BP recorded.  {Refresh Note OR Click here to enter BP  :1}***       Physical Exam:   VS:  There were no vitals taken for this visit.   Wt Readings from Last 3 Encounters:  09/12/22 182 lb 4 oz (82.7 kg)  04/17/22 179 lb (81.2 kg)  04/16/22 177 lb 9.6 oz (80.6 kg)    Physical Exam***     Assessment and Plan:   Assessment & Plan CAD in native artery  Essential (primary) hypertension  Mixed hyperlipidemia   Assessment and Plan           { Type 2 myocardial infarction [facility]) S/p DES to the LAD in 2023. He was recently admitted with AKI in the setting of Rotavirus infection c/b chest pain, elevated hsTroponins. EF was normal on echocardiogram and there were no WM abnormalities. The overall picture was c/w Type II MI and no further testing was indicated. He is now doing well w/o further anginal symptoms. Continue Plavix 75 mg once daily, Coreg 6.25 mg twice daily, Crestor 40 mg once daily. F/u in 6 mos.    Hyperlipidemia Triglycerides were high in Feb but he notes he was not fasting. His Triglycerides were optimal in Dec 2023. Overall, lipids are optimal. Continue Fenofibrate 145 mg once daily, Crestor 40 mg once daily.    Essential (primary) hypertension BP somewhat borderline. He may have an element of white coat HTN. Continue Coreg 6.25 mg twice daily, Lisinopril 20 mg once daily. Monitor BP at home. If BP runs > 130/80, consider addition of amlodipine vs increasing Coreg dose.   Tobacco use disorder He is trying to quit.      :1}    {Are you ordering a CV Procedure (e.g. stress test, cath, DCCV, TEE, etc)?   Press F2        :  [id number]}  Dispo:  No follow-ups on file.  Signed, Tereso Newcomer, PA-C

## 2022-10-15 ENCOUNTER — Telehealth: Payer: Self-pay | Admitting: *Deleted

## 2022-10-15 ENCOUNTER — Ambulatory Visit: Payer: 59 | Attending: Physician Assistant | Admitting: Physician Assistant

## 2022-10-15 ENCOUNTER — Encounter: Payer: Self-pay | Admitting: Physician Assistant

## 2022-10-15 VITALS — BP 128/68 | HR 64 | Ht 70.8 in | Wt 185.0 lb

## 2022-10-15 DIAGNOSIS — I10 Essential (primary) hypertension: Secondary | ICD-10-CM | POA: Diagnosis not present

## 2022-10-15 DIAGNOSIS — E782 Mixed hyperlipidemia: Secondary | ICD-10-CM

## 2022-10-15 DIAGNOSIS — I2510 Atherosclerotic heart disease of native coronary artery without angina pectoris: Secondary | ICD-10-CM | POA: Diagnosis not present

## 2022-10-15 DIAGNOSIS — I739 Peripheral vascular disease, unspecified: Secondary | ICD-10-CM | POA: Diagnosis not present

## 2022-10-15 DIAGNOSIS — R0683 Snoring: Secondary | ICD-10-CM | POA: Diagnosis not present

## 2022-10-15 DIAGNOSIS — F17200 Nicotine dependence, unspecified, uncomplicated: Secondary | ICD-10-CM

## 2022-10-15 NOTE — Assessment & Plan Note (Signed)
He has had a hard time quitting.

## 2022-10-15 NOTE — Assessment & Plan Note (Signed)
History of DES to LAD in 2023. He has residula disease in D1, D2, and RCA which is managed medically. He had a Type II MI in 03/2022 in the setting of gastroenteritis, AKI that was managed medically. Echocardiogram in March 2024 demonstrated normal EF and no WMA. He has infrequent chest discomfort relieved by NTG. Most of his chest pain has been due to stress. This is improved since he retired. He has not had exertional symptoms.  -Continue current medications:Plavix 75mg  daily, Crestor 40mg  daily, Coreg 6.25mg  twice daily, Nitroglycerin as needed. -Return in 6 months for follow-up.

## 2022-10-15 NOTE — Patient Instructions (Signed)
Medication Instructions:  Your physician recommends that you continue on your current medications as directed. Please refer to the Current Medication list given to you today.  *If you need a refill on your cardiac medications before your next appointment, please call your pharmacy*   Lab Work: None ordered  If you have labs (blood work) drawn today and your tests are completely normal, you will receive your results only by: MyChart Message (if you have MyChart) OR A paper copy in the mail If you have any lab test that is abnormal or we need to change your treatment, we will call you to review the results.   Testing/Procedures: Your physician has recommended that you have a sleep study. This test records several body functions during sleep, including: brain activity, eye movement, oxygen and carbon dioxide blood levels, heart rate and rhythm, breathing rate and rhythm, the flow of air through your mouth and nose, snoring, body muscle movements, and chest and belly movement.    Follow-Up: At [facility], you and your health needs are our priority.  As part of our continuing mission to provide you with exceptional heart care, we have created designated Provider Care Teams.  These Care Teams include your primary Cardiologist (physician) and Advanced Practice Providers (APPs -  Physician Assistants and Nurse Practitioners) who all work together to provide you with the care you need, when you need it.  We recommend signing up for the patient portal called "MyChart".  Sign up information is provided on this After Visit Summary.  MyChart is used to connect with patients for Virtual Visits (Telemedicine).  Patients are able to view lab/test results, encounter notes, upcoming appointments, etc.  Non-urgent messages can be sent to your provider as well.   To learn more about what you can do with MyChart, go to [email or web address].    Your next appointment:   6 month(s)  Provider:    Meriam Sprague, MD  or Tereso Newcomer, PA-C         Other Instructions

## 2022-10-15 NOTE — Assessment & Plan Note (Signed)
LDL optimal at 36 in 08/2022. Triglycerides slightly elevated at 175. -Continue current medications:Crestor 40mg  daily, Fenofibrate 145mg  daily.

## 2022-10-15 NOTE — Assessment & Plan Note (Signed)
Stable. Follow up with Dr. Kirke Corin as planned.

## 2022-10-15 NOTE — Assessment & Plan Note (Signed)
Controlled. -Continue current medications:Coreg 6.25mg  twice daily, Lisinopril 20mg  daily.

## 2022-10-15 NOTE — Telephone Encounter (Signed)
Patient agreement reviewed and signed on 10/15/2022.  WatchPAT issued to patient on 10/15/2022 by Elliot Cousin, CMA. Patient aware to not open the WatchPAT box until contacted with the activation PIN. Patient profile initialized in CloudPAT on 10/15/2022 by Dennis Bast, RN. Device serial number: [id number]  Please list Reason for Call as Advice Only and type "WatchPAT issued to patient" in the comment box.

## 2022-10-15 NOTE — Assessment & Plan Note (Signed)
STOP-Bang Score:  6   He notes a hx of snoring and witnessed apnea as well as daytime fatigue.  -Order home sleep study

## 2022-10-17 NOTE — Telephone Encounter (Signed)
Pt states he is trying to get the pin for his sleep apnea machine. Please advise

## 2022-10-17 NOTE — Telephone Encounter (Signed)
Pt is calling to see if he has been approved for [place] study. I will send a message to the sleep coordinator to check on approval. Once the pt has been approved we will call him with the PIN #.   I s/w the pt and stated that I have sent a message to the sleep coordinator. I explained once I have the approval I will call him. Pt said he is going on vacation and will it be ok to do when he gets back. I said that will be fine, pt states he does not really want to do the study while on vacation.

## 2022-10-18 ENCOUNTER — Other Ambulatory Visit: Payer: Self-pay

## 2022-10-18 MED ORDER — EMPAGLIFLOZIN 25 MG PO TABS: 25.0000 mg | ORAL_TABLET | Freq: Every day | ORAL | 3 refills | Status: DC

## 2022-10-20 ENCOUNTER — Other Ambulatory Visit (INDEPENDENT_AMBULATORY_CARE_PROVIDER_SITE_OTHER): Payer: Self-pay | Admitting: Nurse Practitioner

## 2022-10-22 NOTE — Telephone Encounter (Signed)
Prior Authorization for [facility] sent to [employer] via web portal. Tracking Number . NO PA REQ FOR HST   Ordering provider: SCOTT WEAVER Associated diagnoses: R06.83 WatchPAT PA obtained on 10/22/2022 by Latrelle Dodrill, CMA. Authorization: No; tracking ID   Patient notified of PIN (1234) on 10/22/2022 via Notification Method: phone.

## 2022-10-24 ENCOUNTER — Other Ambulatory Visit (INDEPENDENT_AMBULATORY_CARE_PROVIDER_SITE_OTHER): Payer: Self-pay | Admitting: Nurse Practitioner

## 2022-10-24 NOTE — Telephone Encounter (Signed)
Patient no longer under prescribers care. Patient stated he is under a different provider now

## 2022-10-26 ENCOUNTER — Other Ambulatory Visit (INDEPENDENT_AMBULATORY_CARE_PROVIDER_SITE_OTHER): Payer: Self-pay | Admitting: Nurse Practitioner

## 2022-10-28 ENCOUNTER — Encounter (INDEPENDENT_AMBULATORY_CARE_PROVIDER_SITE_OTHER): Payer: Self-pay | Admitting: Cardiology

## 2022-10-28 DIAGNOSIS — G4733 Obstructive sleep apnea (adult) (pediatric): Secondary | ICD-10-CM | POA: Diagnosis not present

## 2022-10-30 ENCOUNTER — Other Ambulatory Visit: Payer: Self-pay

## 2022-10-30 NOTE — Progress Notes (Signed)
Specialty Pharmacy Refill Coordination Note  Jacob Ali is a 62 y.o. male contacted today regarding refills of specialty medication(s) Bictegravir-Emtricitab-Tenofov   Patient requested Delivery   Delivery date: 11/14/22   Verified address: Patient address [address]  [place] [address]   Medication will be filled on 11/13/22.

## 2022-11-10 ENCOUNTER — Ambulatory Visit: Payer: 59 | Attending: Physician Assistant

## 2022-11-10 DIAGNOSIS — R0683 Snoring: Secondary | ICD-10-CM

## 2022-11-10 NOTE — Procedures (Signed)
    SLEEP STUDY REPORT Patient Information Study Date: 10/28/2022 Patient Name: Jacob Ali Patient ID: [id number] Birth Date: [birth year] Age: 62 Gender: Male BMI: 26.5 (W=185 lb, H=5' 10'') Stopbang: 6 Referring Physician: Tereso Newcomer, PA  TEST DESCRIPTION: Home sleep apnea testing was completed using the WatchPat, a Type 1 device, utilizing peripheral arterial tonometry (PAT), chest movement, actigraphy, pulse oximetry, pulse rate, body position and snore. AHI was calculated with apnea and hypopnea using valid sleep time as the denominator. RDI includes apneas, hypopneas, and RERAs. The data acquired and the scoring of sleep and all associated events were performed in accordance with the recommended standards and specifications as outlined in the AASM Manual for the Scoring of Sleep and Associated Events 2.2.0 (2015).  FINDINGS:  1. Severe Obstructive Sleep Apnea with AHI 52.1/hr.  2. Mild Central Sleep Apnea with pAHIc 14.3/hr.  3. Oxygen desaturations as low as 80%.  4. Severe snoring was present. O2 sats were < 88% for 42.6 min.  5. Total sleep time was 7 hrs and 34 min.  6. 23.7% of total sleep time was spent in REM sleep.  7. Shortened sleep onset latency at 8 min.  8. Normal REM sleep onset latency at 99 min.  9. Total awakenings were 14. 10. Arrhythmia detection: None  DIAGNOSIS: Severe Obstructive Sleep Apnea (G47.33) Mild Central Sleep Apnea Nocturnal Hypoxemia  RECOMMENDATIONS: 1. Clinical correlation of these findings is necessary. The decision to treat obstructive sleep apnea (OSA) is usually based on the presence of apnea symptoms or the presence of associated medical conditions such as Hypertension, Congestive Heart Failure, Atrial Fibrillation or Obesity. The most common symptoms of OSA are snoring, gasping for breath while sleeping, daytime sleepiness and fatigue. 2. Initiating apnea therapy is recommended given the presence of symptoms and/or associated  conditions. Recommend proceeding with one of the following:  a. Auto-CPAP therapy with a pressure range of 5-20cm H2O.  b. An oral appliance (OA) that can be obtained from certain dentists with expertise in sleep medicine. These are primarily of use in non-obese patients with mild and moderate disease.  c. An ENT consultation which may be useful to look for specific causes of obstruction and possible treatment options.  d. If patient is intolerant to PAP therapy, consider referral to ENT for evaluation for hypoglossal nerve stimulator. 3. Close follow-up is necessary to ensure success with CPAP or oral appliance therapy for maximum benefit . 4. A follow-up oximetry study on CPAP is recommended to assess the adequacy of therapy and determine the need for supplemental oxygen or the potential need for Bi-level therapy. An arterial blood gas to determine the adequacy of baseline ventilation and oxygenation should also be considered. 5. Healthy sleep recommendations include: adequate nightly sleep (normal 7-9 hrs/night), avoidance of caffeine after noon and alcohol near bedtime, and maintaining a sleep environment that is cool, dark and quiet. 6. Weight loss for overweight patients is recommended. Even modest amounts of weight loss can significantly improve the severity of sleep apnea. 7. Snoring recommendations include: weight loss where appropriate, side sleeping, and avoidance of alcohol before bed. 8. Operation of motor vehicle should be avoided when sleepy. Signature:  Armanda Magic, MD; [facility]; Diplomat, American Board of Sleep Medicine Electronically Signed: 11/10/2022 7:47:53 PM

## 2022-11-19 ENCOUNTER — Ambulatory Visit: Payer: 59 | Admitting: Infectious Diseases

## 2022-11-19 ENCOUNTER — Telehealth: Payer: Self-pay | Admitting: *Deleted

## 2022-11-19 ENCOUNTER — Encounter: Payer: Self-pay | Admitting: Infectious Diseases

## 2022-11-19 VITALS — BP 138/60 | HR 85 | Temp 98.3°F | Ht 69.0 in | Wt 184.1 lb

## 2022-11-19 DIAGNOSIS — F17210 Nicotine dependence, cigarettes, uncomplicated: Secondary | ICD-10-CM

## 2022-11-19 DIAGNOSIS — Z21 Asymptomatic human immunodeficiency virus [HIV] infection status: Secondary | ICD-10-CM

## 2022-11-19 DIAGNOSIS — B20 Human immunodeficiency virus [HIV] disease: Secondary | ICD-10-CM

## 2022-11-19 DIAGNOSIS — N179 Acute kidney failure, unspecified: Secondary | ICD-10-CM | POA: Diagnosis not present

## 2022-11-19 DIAGNOSIS — E1151 Type 2 diabetes mellitus with diabetic peripheral angiopathy without gangrene: Secondary | ICD-10-CM

## 2022-11-19 DIAGNOSIS — D126 Benign neoplasm of colon, unspecified: Secondary | ICD-10-CM

## 2022-11-19 DIAGNOSIS — Z79899 Other long term (current) drug therapy: Secondary | ICD-10-CM

## 2022-11-19 DIAGNOSIS — I10 Essential (primary) hypertension: Secondary | ICD-10-CM

## 2022-11-19 DIAGNOSIS — I739 Peripheral vascular disease, unspecified: Secondary | ICD-10-CM

## 2022-11-19 DIAGNOSIS — F17200 Nicotine dependence, unspecified, uncomplicated: Secondary | ICD-10-CM

## 2022-11-19 DIAGNOSIS — I2510 Atherosclerotic heart disease of native coronary artery without angina pectoris: Secondary | ICD-10-CM

## 2022-11-19 DIAGNOSIS — Z113 Encounter for screening for infections with a predominantly sexual mode of transmission: Secondary | ICD-10-CM

## 2022-11-19 DIAGNOSIS — G4733 Obstructive sleep apnea (adult) (pediatric): Secondary | ICD-10-CM

## 2022-11-19 DIAGNOSIS — R0683 Snoring: Secondary | ICD-10-CM

## 2022-11-19 DIAGNOSIS — Z7984 Long term (current) use of oral hypoglycemic drugs: Secondary | ICD-10-CM

## 2022-11-19 DIAGNOSIS — E1165 Type 2 diabetes mellitus with hyperglycemia: Secondary | ICD-10-CM

## 2022-11-19 NOTE — Assessment & Plan Note (Signed)
Encouraged him to quit 

## 2022-11-19 NOTE — Assessment & Plan Note (Signed)
Needs repeat this year, will refer.

## 2022-11-19 NOTE — Telephone Encounter (Signed)
The patient has been notified of the result and verbalized understanding.  All questions (if any) were answered. Latrelle Dodrill, CMA 11/19/2022 9:16 AM    Upon patient request DME selection is [facility] Patient understands he will be contacted by [facility] to set up his cpap. Patient understands to call if [facility] does not contact him with new setup in a timely manner. Patient understands they will be called once confirmation has been received from [facility] that they have received their new machine to schedule 10 week follow up appointment.   [facility] notified of new cpap order  Please add to airview Patient was grateful for the call and thanked me.

## 2022-11-19 NOTE — Assessment & Plan Note (Signed)
Appreciate the CV providers following up with him.

## 2022-11-19 NOTE — Assessment & Plan Note (Signed)
His A1C is well controlled.  Appreciate his PCP f/u.

## 2022-11-19 NOTE — Assessment & Plan Note (Signed)
Mildly elevated today. Appreciate his PCP f/u

## 2022-11-19 NOTE — Assessment & Plan Note (Signed)
Lab Results  Component Value Date   CREATININE 1.28 09/13/2022   CREATININE 0.94 04/10/2022   CREATININE 1.08 04/02/2022   Has resolved.

## 2022-11-19 NOTE — Assessment & Plan Note (Signed)
He is doing well No change in his biktarvy.  Flu, covid shot done at [facility].  Wants new GI, will refer to [facility].  Will check his labs today.  Rtc in 9 months.

## 2022-11-19 NOTE — Progress Notes (Signed)
Subjective:    Patient ID: Jacob Ali, male  DOB: 28-Aug-1960, 62 y.o.        MRN: [id number]   HPI 62 y.o. M HIV+ September 2012.  Hx of HTN, hyperlipidemia, hospitalization for tylenol overdose and cutting of wrists on 02/20/11.  He underwent atherectomy (01-2016) L SFA, PTCA and stent of L SFA and popliteal arteries; and PTCA and stent of R common iliac.  Duplex ultrasound of the left leg shows moderat restenosis of the stent in the SFA >50%. He states his f/u since have been good.  Prev taking tivicay-descovy. Changed to biktarvy 11-2016. Alexa tells him to take his pills every morning at 7:30am.    On crestor/fibrate for hyperlipidemia (lipitor gave him myalgias).  He was hospitalized in Jan 2023 and underwent stent of LAD. He continues to have CV f/u.     His A1C is 4.2 (08-2022).  Is going to make ophtho appt for this year. Has "black spots in the brown of my eye... no one seems to know what it is".   He has lost his eye care, his DM provider has referred him and he says this is fine.    Smokes cigarillos, still off and on.     Mother is out of SNF/ALF- this year has been marked by CHF, COVID, vertebral fractures. She had a difficult summer 2024 with more hospital and SNF visits.   He had to put his dog down. And then got new dog that he fell over and cracked 2 ribs. Took time off work for this.  He then got rotavirus (March 2024, by day 3 he called ambulance. He had severe abd pain then was taken to [facility] by his sister.    Was in hospital for 5 days, ARF. He also had CV event due to this stress as well.  He had multiple HR issues when he came back and so he decided to retire. He had a friend move in with his who has been a great support.  Taking occas prn trazodone for insomnia. He has also been eval for sleep apnea (SpO2 to 80s while sleeping). Snored all his life. He is getting set to get a machine.  Would like to get elderly activities.   Has no med co-pays.  No  problems with ART. Biktarvy.   HIV 1 RNA Quant  Date Value  11/14/2020 Not Detected Copies/mL  11/03/2019 29 Copies/mL (H)  11/17/2018 <20 NOT DETECTED copies/mL   HIV-1 RNA Viral Load (copies/mL)  Date Value  11/07/2021 <20   CD4 (no units)  Date Value  11/24/2015 1,130  05/17/2015 1,550  09/30/2014 952   CD4 T Cell Abs (/uL)  Date Value  11/07/2021 1,866 (H)  11/14/2020 1,456  11/03/2019 1,530     Health Maintenance  Topic Date Due  . FOOT EXAM  05/30/2021  . COVID-19 Vaccine (7 - 2023-24 season) 11/16/2022  . OPHTHALMOLOGY EXAM  05/02/2023 (Originally 11/30/1970)  . HEMOGLOBIN A1C  03/14/2023  . Diabetic kidney evaluation - eGFR measurement  09/13/2023  . Diabetic kidney evaluation - Urine ACR  09/13/2023  . Colonoscopy  09/23/2027  . DTaP/Tdap/Td (3 - Td or Tdap) 03/17/2030  . INFLUENZA VACCINE  Completed  . Hepatitis C Screening  Completed  . HIV Screening  Completed  . Zoster Vaccines- Shingrix  Completed  . HPV VACCINES  Aged Out  . Lung Cancer Screening  Discontinued      Review of Systems  Constitutional:  Negative for  chills, fever and weight loss.  Eyes:  Negative for blurred vision and double vision.  Respiratory:  Negative for cough and shortness of breath.   Gastrointestinal:  Negative for constipation and diarrhea.  Genitourinary:  Negative for dysuria.  Neurological:  Negative for sensory change.    Please see HPI. All other systems reviewed and negative.     Objective:  Physical Exam Vitals reviewed.  Constitutional:      Appearance: Normal appearance.  HENT:     Mouth/Throat:     Mouth: Mucous membranes are moist.     Pharynx: No oropharyngeal exudate.  Eyes:     Extraocular Movements: Extraocular movements intact.     Pupils: Pupils are equal, round, and reactive to light.  Cardiovascular:     Rate and Rhythm: Normal rate and regular rhythm.     Pulses:          Dorsalis pedis pulses are 3+ on the right side and 3+ on the  left side.  Pulmonary:     Effort: Pulmonary effort is normal.     Breath sounds: Normal breath sounds.  Abdominal:     General: Bowel sounds are normal. There is no distension.     Palpations: Abdomen is soft.     Tenderness: There is no abdominal tenderness.  Musculoskeletal:        General: Normal range of motion.     Cervical back: Normal range of motion and neck supple.     Right lower leg: No edema.     Left lower leg: No edema.  Feet:     Right foot:     Protective Sensation: 2 sites tested.  2 sites sensed.     Skin integrity: Skin integrity normal.     Left foot:     Protective Sensation: 2 sites tested.  2 sites sensed.     Skin integrity: Skin integrity normal.  Lymphadenopathy:     Cervical: No cervical adenopathy.  Neurological:     General: No focal deficit present.     Mental Status: He is alert.  Psychiatric:        Mood and Affect: Mood normal.          Assessment & Plan:

## 2022-11-20 LAB — T-HELPER CELLS (CD4) COUNT (NOT AT ARMC)
CD4 % Helper T Cell: 42 % (ref 33–65)
CD4 T Cell Abs: 1641 /uL (ref 400–1790)

## 2022-11-20 LAB — COMPREHENSIVE METABOLIC PANEL
ALT: 19 [IU]/L (ref 0–44)
AST: 20 [IU]/L (ref 0–40)
Albumin: 4.8 g/dL (ref 3.9–4.9)
Alkaline Phosphatase: 50 [IU]/L (ref 44–121)
BUN/Creatinine Ratio: 16 (ref 10–24)
BUN: 19 mg/dL (ref 8–27)
Bilirubin Total: 0.5 mg/dL (ref 0.0–1.2)
CO2: 19 mmol/L — ABNORMAL LOW (ref 20–29)
Calcium: 9.6 mg/dL (ref 8.6–10.2)
Chloride: 107 mmol/L — ABNORMAL HIGH (ref 96–106)
Creatinine, Ser: 1.2 mg/dL (ref 0.76–1.27)
Globulin, Total: 2.1 g/dL (ref 1.5–4.5)
Glucose: 92 mg/dL (ref 70–99)
Potassium: 4 mmol/L (ref 3.5–5.2)
Sodium: 141 mmol/L (ref 134–144)
Total Protein: 6.9 g/dL (ref 6.0–8.5)
eGFR: 68 mL/min/{1.73_m2} (ref >59)

## 2022-11-20 LAB — CBC
Hematocrit: 38 % (ref 37.5–51.0)
Hemoglobin: 12.5 g/dL — ABNORMAL LOW (ref 13.0–17.7)
MCH: 33.9 pg — ABNORMAL HIGH (ref 26.6–33.0)
MCHC: 32.9 g/dL (ref 31.5–35.7)
MCV: 103 fL — ABNORMAL HIGH (ref 79–97)
Platelets: 234 10*3/uL (ref 150–450)
RBC: 3.69 x10E6/uL — ABNORMAL LOW (ref 4.14–5.80)
RDW: 11.7 % (ref 11.6–15.4)
WBC: 9.6 10*3/uL (ref 3.4–10.8)

## 2022-11-20 LAB — RPR: RPR Ser Ql: NONREACTIVE

## 2022-11-20 LAB — HIV-1 RNA QUANT-NO REFLEX-BLD
HIV-1 RNA Viral Load Log: 2.176 {Log}
HIV-1 RNA Viral Load: 150 {copies}/mL

## 2022-11-22 ENCOUNTER — Ambulatory Visit (INDEPENDENT_AMBULATORY_CARE_PROVIDER_SITE_OTHER): Payer: 59 | Admitting: Nurse Practitioner

## 2022-11-22 VITALS — BP 130/60 | Temp 98.1°F | Ht 69.0 in | Wt 183.5 lb

## 2022-11-22 DIAGNOSIS — Z7984 Long term (current) use of oral hypoglycemic drugs: Secondary | ICD-10-CM | POA: Diagnosis not present

## 2022-11-22 DIAGNOSIS — E1165 Type 2 diabetes mellitus with hyperglycemia: Secondary | ICD-10-CM

## 2022-11-22 DIAGNOSIS — D126 Benign neoplasm of colon, unspecified: Secondary | ICD-10-CM | POA: Diagnosis not present

## 2022-11-22 DIAGNOSIS — K219 Gastro-esophageal reflux disease without esophagitis: Secondary | ICD-10-CM

## 2022-11-22 MED ORDER — PANTOPRAZOLE SODIUM 40 MG PO TBEC: 40.0000 mg | DELAYED_RELEASE_TABLET | Freq: Every day | ORAL | 2 refills | Status: DC

## 2022-11-22 MED ORDER — PANTOPRAZOLE SODIUM 40 MG PO TBEC
40.0000 mg | DELAYED_RELEASE_TABLET | Freq: Every day | ORAL | 2 refills | Status: DC
Start: 2022-11-22 — End: 2022-11-22

## 2022-11-22 NOTE — Assessment & Plan Note (Signed)
Chronic Symptoms not well-controlled unless he is taking pantoprazole regularly Discussed good Rx, he will try to use this coupon code to help with affordability Follow-up in 6 months or sooner as needed

## 2022-11-22 NOTE — Assessment & Plan Note (Signed)
Due for colon cancer screening Patient has been rereferred by Dr. Ninetta Lights, patient encouraged to call if additional assistance is needed

## 2022-11-22 NOTE — Patient Instructions (Signed)
Good Rx

## 2022-11-22 NOTE — Progress Notes (Signed)
Established Patient Office Visit  Subjective   Patient ID: Jacob Ali, male    DOB: [birth year]  Age: 62 y.o. MRN: [id number]  Chief Complaint  Patient presents with   Gastroesophageal Reflux    Genella Rife: Chronic, did improve with pantoprazole but unfortunately his insurance stated they will no longer cover this medication for him.  Patient does continue on clopidogrel thus omeprazole may not be a good option.  Patient reports that his income is fixed and he is unable to pay out-of-pocket any high cost for medications including over-the-counter medications.  Would prefer not to trial Pepcid due to it not being covered as it is an over-the-counter medication.  Does have reflux multiple times a week, seems to be worse at night, often results in coughing and a sensation of choking.  When taking pantoprazole reflux symptoms are controlled.  Type 2 diabetes: Last A1c 4.2.  Patient denies hypoglycemic episodes.  On Jardiance 25 mg daily for monotherapy.  Has associated hyperlipidemia specifically hypertriglyceridemia.  Continues on fenofibrate and rosuvastatin.  Last triglyceride level 175, last LDL 35.  Also on lisinopril.  Colon cancer screening: Has history of colonic polyps.  Overdue for colon cancer screening.  Has been referred by myself but there is an issue with getting an appointment scheduled.  He discussed this with his infectious disease provider as well who has also started working on getting him referred to [facility] for colon cancer screening.      Review of Systems  Respiratory:  Negative for shortness of breath.   Cardiovascular:  Negative for chest pain.  Gastrointestinal:  Positive for heartburn. Negative for abdominal pain.      Objective:     BP 130/60   Temp 98.1 F (36.7 C) (Temporal)   Ht 5\' 9"  (1.753 m)   Wt 183 lb 8 oz (83.2 kg)   BMI 27.10 kg/m  BP Readings from Last 3 Encounters:  11/22/22 130/60  11/19/22 138/60  10/15/22 128/68   Wt Readings from  Last 3 Encounters:  11/22/22 183 lb 8 oz (83.2 kg)  11/19/22 184 lb 1.6 oz (83.5 kg)  10/15/22 185 lb (83.9 kg)      Physical Exam Vitals reviewed.  Constitutional:      Appearance: Normal appearance.  HENT:     Head: Normocephalic and atraumatic.  Cardiovascular:     Rate and Rhythm: Normal rate and regular rhythm.  Pulmonary:     Effort: Pulmonary effort is normal.     Breath sounds: Normal breath sounds.  Musculoskeletal:     Cervical back: Neck supple.  Skin:    General: Skin is warm and dry.  Neurological:     Mental Status: He is alert and oriented to person, place, and time.  Psychiatric:        Mood and Affect: Mood normal.        Behavior: Behavior normal.        Thought Content: Thought content normal.        Judgment: Judgment normal.      No results found for any visits on 11/22/22.    The ASCVD Risk score (Arnett DK, et al., 2019) failed to calculate for the following reasons:   The patient has a prior MI or stroke diagnosis    Assessment & Plan:   Problem List Items Addressed This Visit       Digestive   Gastroesophageal reflux disease    Chronic Symptoms not well-controlled unless he is taking pantoprazole  regularly Discussed good Rx, he will try to use this coupon code to help with affordability Follow-up in 6 months or sooner as needed      Relevant Medications   pantoprazole (PROTONIX) 40 MG tablet   Tubular adenoma of colon    Due for colon cancer screening Patient has been rereferred by Dr. Ninetta Lights, patient encouraged to call if additional assistance is needed        Endocrine   Type 2 diabetes mellitus (HCC) - Primary    Chronic A1c at goal Continue Jardiance 25 mg daily Follow-up in 6 months or sooner as needed       Return in about 6 months (around 05/22/2023) for F/U with Maralyn Sago.    Elenore Paddy, NP

## 2022-11-22 NOTE — Assessment & Plan Note (Signed)
Chronic A1c at goal Continue Jardiance 25 mg daily Follow-up in 6 months or sooner as needed

## 2022-11-23 ENCOUNTER — Other Ambulatory Visit (INDEPENDENT_AMBULATORY_CARE_PROVIDER_SITE_OTHER): Payer: Self-pay | Admitting: Family

## 2022-11-26 ENCOUNTER — Other Ambulatory Visit: Payer: Self-pay

## 2022-11-28 ENCOUNTER — Other Ambulatory Visit: Payer: Self-pay | Admitting: Nurse Practitioner

## 2022-11-29 MED ORDER — CLOPIDOGREL BISULFATE 75 MG PO TABS: 75.0000 mg | ORAL_TABLET | Freq: Every day | ORAL | 0 refills | Status: DC

## 2022-12-05 ENCOUNTER — Other Ambulatory Visit (HOSPITAL_COMMUNITY): Payer: Self-pay

## 2022-12-05 ENCOUNTER — Other Ambulatory Visit: Payer: Self-pay | Admitting: Primary Care

## 2022-12-05 ENCOUNTER — Other Ambulatory Visit: Payer: Self-pay

## 2022-12-05 ENCOUNTER — Other Ambulatory Visit: Payer: Self-pay | Admitting: Internal Medicine

## 2022-12-05 DIAGNOSIS — Z720 Tobacco use: Secondary | ICD-10-CM | POA: Diagnosis not present

## 2022-12-05 DIAGNOSIS — Z21 Asymptomatic human immunodeficiency virus [HIV] infection status: Secondary | ICD-10-CM

## 2022-12-05 DIAGNOSIS — H938X3 Other specified disorders of ear, bilateral: Secondary | ICD-10-CM

## 2022-12-05 DIAGNOSIS — J209 Acute bronchitis, unspecified: Secondary | ICD-10-CM | POA: Diagnosis not present

## 2022-12-05 DIAGNOSIS — Z6827 Body mass index (BMI) 27.0-27.9, adult: Secondary | ICD-10-CM | POA: Diagnosis not present

## 2022-12-05 MED ORDER — BIKTARVY 50-200-25 MG PO TABS
1.0000 | ORAL_TABLET | Freq: Every day | ORAL | 6 refills | Status: DC
  Filled 2022-12-05: qty 30, 30d supply, fill #0
  Filled 2022-12-30: qty 30, 30d supply, fill #1
  Filled 2023-02-05: qty 30, 30d supply, fill #2
  Filled 2023-03-06: qty 30, 30d supply, fill #3
  Filled 2023-04-03: qty 30, 30d supply, fill #4
  Filled 2023-05-05: qty 30, 30d supply, fill #5
  Filled 2023-06-04: qty 30, 30d supply, fill #6

## 2022-12-05 NOTE — Progress Notes (Signed)
Specialty Pharmacy Refill Coordination Note  Jacob Ali is a 62 y.o. male contacted today regarding refills of specialty medication(s) Bictegravir-Emtricitab-Tenofov   Patient requested Delivery   Delivery date: 12/13/22   Verified address: [address] [place] [address]   Medication will be filled on 12/11/22.  Refill request pending.

## 2022-12-18 ENCOUNTER — Other Ambulatory Visit: Payer: Self-pay

## 2022-12-26 ENCOUNTER — Other Ambulatory Visit: Payer: Self-pay | Admitting: Nurse Practitioner

## 2022-12-30 ENCOUNTER — Other Ambulatory Visit (HOSPITAL_COMMUNITY): Payer: Self-pay

## 2022-12-30 ENCOUNTER — Encounter (HOSPITAL_COMMUNITY): Payer: Self-pay

## 2022-12-30 ENCOUNTER — Telehealth: Payer: Self-pay | Admitting: Gastroenterology

## 2022-12-30 ENCOUNTER — Other Ambulatory Visit: Payer: Self-pay

## 2022-12-30 NOTE — Progress Notes (Signed)
Specialty Pharmacy Refill Coordination Note  Jacob Ali is a 62 y.o. male contacted today regarding refills of specialty medication(s) Bictegravir-Emtricitab-Tenofov [name])   Patient requested Delivery   Delivery date: 01/14/23   Verified address: [address] [place] [address]   Medication will be filled on 01/13/23.

## 2022-12-30 NOTE — Progress Notes (Signed)
Specialty Pharmacy Ongoing Clinical Assessment Note  Jacob Ali is a 62 y.o. male who is being followed by the specialty pharmacy service for RxSp HIV   Patient's specialty medication(s) reviewed today: Bictegravir-Emtricitab-Tenofov (Biktarvy)   Missed doses in the last 4 weeks: 0   Patient/Caregiver did not have any additional questions or concerns.   Therapeutic benefit summary: Patient is achieving benefit   Adverse events/side effects summary: No adverse events/side effects   Patient's therapy is appropriate to: Continue    Goals Addressed             This Visit's Progress    Achieve Undetectable HIV Viral Load < 20       Patient is not on track and worsening. Patient will maintain adherence.  Most recent viral load was 150 copies/mL, previously had been undetectable.          Follow up:  6 months  Servando Snare Specialty Pharmacist

## 2022-12-30 NOTE — Telephone Encounter (Signed)
Good morning Dr. Chales Abrahams,   Supervising Provider 12/30/2022   We received a referral for patient to have a colonoscopy. Patient has GI history with Dr. Loreta Ave, seeking to transfer care to [facility]. He would like to be within the [facility] system. Had a colonoscopy in 2019 records are available in Epic for you to review and advise on scheduling.  Thank you,

## 2022-12-31 NOTE — Telephone Encounter (Signed)
Colonoscopy report reviewed-7 mm tubular adenoma s/p polypectomy 09/2017 Previous recommendations was to repeat in 5 years Current recommendations is to repeat in 7 years  Hence, recall colonoscopy 09/2024.  Certainly earlier if there is any change in family history or any new problems  RG

## 2023-01-10 NOTE — Telephone Encounter (Signed)
Recall placed in Epic; 

## 2023-01-13 ENCOUNTER — Other Ambulatory Visit: Payer: Self-pay

## 2023-01-22 ENCOUNTER — Telehealth: Payer: Self-pay | Admitting: Physician Assistant

## 2023-01-22 NOTE — Telephone Encounter (Signed)
 Pt c/o medication issue:  1. Name of Medication:   empagliflozin  (JARDIANCE ) 25 MG TABS tablet    2. How are you currently taking this medication (dosage and times per day)? Take 1 tablet (25 mg total) by mouth daily before breakfast.   3. Are you having a reaction (difficulty breathing--STAT)? No  4. What is your medication issue? Pt states that per new insurance a Prior Auth is needed for medication or another be prescribed such as Farxiga . Please advise

## 2023-01-23 ENCOUNTER — Other Ambulatory Visit (HOSPITAL_COMMUNITY): Payer: Self-pay

## 2023-01-23 ENCOUNTER — Telehealth: Payer: Self-pay | Admitting: Pharmacy Technician

## 2023-01-23 NOTE — Telephone Encounter (Signed)
 Note from pharmacy tech sent to patient via MyChart message.

## 2023-01-23 NOTE — Telephone Encounter (Signed)
 Pharmacy Patient Advocate Encounter  Received notification from [employer] that Prior Authorization for Jardiance  25MG  tablets has been APPROVED from 01/23/23 to 01/23/24 . I called and spoke to the patients phramacy and they said the prescription is too soon and they will refill it on 02/09/23.    PA #/Case ID/Reference #: [id number]

## 2023-01-23 NOTE — Telephone Encounter (Signed)
 Pharmacy Patient Advocate Encounter   Received notification from Pt Calls Messages that prior authorization for Jardiance  25MG  tablets is required/requested.   Insurance verification completed.   The patient is insured through [facility] .   Per test claim: PA required; PA submitted to above mentioned insurance via CoverMyMeds Key/confirmation #/EOC [id number] Status is pending

## 2023-01-23 NOTE — Telephone Encounter (Signed)
 PA request has been Submitted. New Encounter created for follow up. For additional info see Pharmacy Prior Auth telephone encounter from 01/23/23.

## 2023-02-03 ENCOUNTER — Encounter: Payer: Self-pay | Admitting: Cardiology

## 2023-02-05 ENCOUNTER — Other Ambulatory Visit: Payer: Self-pay

## 2023-02-05 NOTE — Progress Notes (Signed)
Specialty Pharmacy Refill Coordination Note  Jacob Ali is a 63 y.o. male contacted today regarding refills of specialty medication(s) Bictegravir-Emtricitab-Tenofov [name])   Patient requested Delivery   Delivery date: 02/13/23   Verified address: [address] [place] [address]   Medication will be filled on 01.29.25.

## 2023-02-09 ENCOUNTER — Encounter: Payer: Self-pay | Admitting: Cardiology

## 2023-02-09 DIAGNOSIS — G4733 Obstructive sleep apnea (adult) (pediatric): Secondary | ICD-10-CM | POA: Insufficient documentation

## 2023-02-09 NOTE — Progress Notes (Unsigned)
SLEEP MEDICINE VIRTUAL CONSULT NOTE via Video Note   Because of Jacob Ali's co-morbid illnesses, he is at least at moderate risk for complications without adequate follow up.  This format is felt to be most appropriate for this patient at this time.  All issues noted in this document were discussed and addressed.  A limited physical exam was performed with this format.  Please refer to the patient's chart for his consent to telehealth for [facility].      Date:  02/10/2023   ID:  Jacob Ali, DOB 10/26/1960, MRN [id number] The patient was identified using 2 identifiers.  Patient Location: Home Provider Location: Home Office   PCP:  Jacob Paddy, NP    [facility] Providers Cardiologist:  Jacob Schultz, MD Cardiology APP:  Jacob Lecher, PA-C     Evaluation Performed:  New Patient Evaluation  Chief Complaint:  OSA  History of Present Illness:    Jacob Ali is a 63 y.o. male who is being seen today for the evaluation of OSA at the request of Jacob Ali, [place].  Jacob Ali is a 63 y.o. male with a hx of CAD, HTN, HLD, HIV, subdural hematoma who was seen by Jacob Newcomer, PA in 10/2022 and complained of excessive daytime sleepiness and was worried he may have OSA. He underwent HST showing Severe OSA with an AHI of 52/hr and mild CSA with an AHI of 14.3/hr and was started on auto CPAP from 4 to 15cm H2O.  He is now referred for sleep medicine consultation for evaluation and treatment of OSA.  He is doing well with his PAP device and thinks that he has gotten used to it.  He tolerates the full face mask but is leaving marks on his face and also causes swelling under his eyes due to the mask leaking a lot and blowing air into his eyes.  He feels the pressure is too high at time as well.  Since going on PAP he feels rested in the am and has no significant daytime sleepiness.  He denies any significant nasal dryness or nasal congestion but does have some  mouth dryness .      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   Subdural hemorrhage (HCC)    Tubular adenoma of colon 11/03/2017   Colonoscopy Sept 9, 2019   Past Surgical History:  Procedure Laterality Date   CARDIAC CATHETERIZATION     CORONARY STENT INTERVENTION N/A 01/30/2021   Procedure: CORONARY STENT INTERVENTION;  Surgeon: Lyn Records, MD;  Location: [facility] INVASIVE CV LAB;  Service: Cardiovascular;  Laterality: N/A;   Examination under anesthesia, repair of anal fissure,  12/04/2000   LEFT HEART CATH AND CORONARY ANGIOGRAPHY N/A 01/30/2021   Procedure: LEFT HEART CATH AND CORONARY ANGIOGRAPHY;  Surgeon: Lyn Records, MD;  Location: [facility] INVASIVE CV  LAB;  Service: Cardiovascular;  Laterality: N/A;   PERIPHERAL VASCULAR CATHETERIZATION Left 02/06/2016   Procedure: Lower Extremity Angiography;  Surgeon: Renford Dills, MD;  Location: [facility] INVASIVE CV LAB;  Service: Cardiovascular;  Laterality: Left;   PERIPHERAL VASCULAR CATHETERIZATION N/A 02/06/2016   Procedure: Abdominal Aortogram w/Lower Extremity;  Surgeon: Renford Dills, MD;  Location: [facility] INVASIVE CV LAB;  Service: Cardiovascular;  Laterality: N/A;   PERIPHERAL VASCULAR CATHETERIZATION  02/06/2016   Procedure: Lower Extremity Intervention;  Surgeon: Renford Dills, MD;  Location: [facility] INVASIVE CV LAB;  Service: Cardiovascular;;     Current Meds  Medication Sig   ACCU-CHEK GUIDE test strip USE UP TO 4 TIMES A DAY AS DIRECTED    Accu-Chek Softclix Lancets lancets USE UP TO 4 TIMES A DAY AS DIRECTED   acetaminophen (TYLENOL) 500 MG tablet Take 500 mg by mouth every 6 (six) hours as needed.   bictegravir-emtricitabine-tenofovir AF (BIKTARVY) 50-200-25 MG TABS tablet Take 1 tablet by mouth daily.   carvedilol (COREG) 6.25 MG tablet Take 1 tablet (6.25 mg total) by mouth 2 (two) times daily with a meal.   clopidogrel (PLAVIX) 75 MG tablet TAKE 1 TABLET BY MOUTH EVERY DAY   Cyanocobalamin (VITAMIN B 12 PO) Take 1 tablet by mouth daily at 12 noon.   empagliflozin (JARDIANCE) 25 MG TABS tablet Take 1 tablet (25 mg total) by mouth daily before breakfast.   fenofibrate (TRICOR) 145 MG tablet TAKE 1 TABLET BY MOUTH EVERY DAY   fish oil-omega-3 fatty acids 1000 MG capsule Take 2 g by mouth daily.   fluticasone (FLONASE) 50 MCG/ACT nasal spray PLACE 1 SPRAY INTO BOTH NOSTRILS 2 (TWO) TIMES DAILY   lisinopril (ZESTRIL) 20 MG tablet Take 1 tablet (20 mg total) by mouth daily.   nitroGLYCERIN (NITROSTAT) 0.4 MG SL tablet PLACE 1 TABLET UNDER THE TONGUE EVERY 5 MINUTES FOR 3 DOSES AS NEEDED FOR CHEST PAIN   pantoprazole (PROTONIX) 40 MG tablet Take 1 tablet (40 mg total) by mouth daily. For heartburn.   rosuvastatin (CRESTOR) 40 MG tablet Take 1 tablet (40 mg total) by mouth daily.   traZODone (DESYREL) 50 MG tablet TAKE 0.5-1 TABLETS BY MOUTH AT BEDTIME AS NEEDED FOR SLEEP.     Allergies:   Atorvastatin, Metformin and related, and Tamiflu [oseltamivir phosphate]   Social History   Tobacco Use   Smoking status: Every Day    Types: Cigars   Smokeless tobacco: Never   Tobacco comments:    1/2PPD Mr Gettis was wearing a nicotine patch today and says he plans to quit smoking. Given the phone number to 1-800=quit-now   Vaping Use   Vaping status: Former  Substance Use Topics   Alcohol use: Yes    Alcohol/week: 2.0 standard drinks of alcohol    Types: 2 Standard drinks or equivalent per week    Comment: 1-2 times a week   Drug  use: Not Currently    Types: Marijuana    Comment: 2 per month     Family Hx: The patient's family history includes Asthma in his mother; Cancer in his father and maternal grandmother; Heart disease in his father; Hypertension in his mother; Throat cancer in his father.  ROS:   Please see the history of present illness.     All other systems reviewed and are negative.   Prior Sleep studies:   The following studies were reviewed today:  HST, PAP compliance download  Labs/Other Tests and Data Reviewed:  Recent Labs: 03/29/2022: Magnesium 2.2 09/13/2022: TSH 1.68 11/19/2022: ALT 19; BUN 19; Creatinine, Ser 1.20; Hemoglobin 12.5; Platelets 234; Potassium 4.0; Sodium 141    Wt Readings from Last 3 Encounters:  02/10/23 182 lb (82.6 kg)  11/22/22 183 lb 8 oz (83.2 kg)  11/19/22 184 lb 1.6 oz (83.5 kg)     Risk Assessment/Calculations:      STOP-Bang Score:  6      Objective:    Vital Signs:  BP 132/68   Pulse 78   Ht 5\' 9"  (1.753 m)   Wt 182 lb (82.6 kg)   BMI 26.88 kg/m    VITAL SIGNS:  reviewed GEN:  no acute distress EYES:  sclerae anicteric, EOMI - Extraocular Movements Intact RESPIRATORY:  normal respiratory effort, symmetric expansion CARDIOVASCULAR:  no peripheral edema SKIN:  no rash, lesions or ulcers. MUSCULOSKELETAL:  no obvious deformities. NEURO:  alert and oriented x 3, no obvious focal deficit PSYCH:  normal affect  ASSESSMENT & PLAN:    OSA - The patient is tolerating PAP therapy well without any problems. The PAP download performed by his DME was personally reviewed and interpreted by me today and showed an AHI of 2.9/hr on auto CPAP from 4 to 15 cm H2O with 97% compliance in using more than 4 hours nightly.  The patient has been using and benefiting from PAP use and will continue to benefit from therapy.  -his mask has a large leak and he is not really tolerating it due to face swelling from pulling the mask so tight due to mask leakage -I will  change to a nasal pillow mask with chin strap to see if that works better.  If the mask does not leak hopefully the pressure will not need to go the entire way to 15 which will then cause more leakage.   HTN -BP controlled on exam today -continue prescription drug management with Carvedilol 6.25mg  BID, Lisinopril 20mg  daily with PRN refills   Time:   Today, I have spent 15 minutes with the patient with telehealth technology discussing the above problems.     Medication Adjustments/Labs and Tests Ordered: Current medicines are reviewed at length with the patient today.  Concerns regarding medicines are outlined above.   Tests Ordered: No orders of the defined types were placed in this encounter.   Medication Changes: No orders of the defined types were placed in this encounter.   Follow Up: 8 weeks  Signed, Armanda Magic, MD  02/10/2023 8:23 AM    [facility]

## 2023-02-10 ENCOUNTER — Ambulatory Visit: Payer: Medicaid Other | Attending: Cardiology | Admitting: Cardiology

## 2023-02-10 VITALS — BP 132/68 | HR 78 | Ht 69.0 in | Wt 182.0 lb

## 2023-02-10 DIAGNOSIS — G4733 Obstructive sleep apnea (adult) (pediatric): Secondary | ICD-10-CM | POA: Diagnosis not present

## 2023-02-10 DIAGNOSIS — I10 Essential (primary) hypertension: Secondary | ICD-10-CM

## 2023-02-11 ENCOUNTER — Ambulatory Visit: Payer: Medicaid Other | Attending: Cardiovascular Disease | Admitting: Cardiovascular Disease

## 2023-02-11 ENCOUNTER — Encounter: Payer: Self-pay | Admitting: Cardiovascular Disease

## 2023-02-11 VITALS — BP 90/46 | HR 71 | Ht 69.0 in | Wt 185.0 lb

## 2023-02-11 DIAGNOSIS — E785 Hyperlipidemia, unspecified: Secondary | ICD-10-CM

## 2023-02-11 DIAGNOSIS — I2510 Atherosclerotic heart disease of native coronary artery without angina pectoris: Secondary | ICD-10-CM | POA: Diagnosis not present

## 2023-02-11 DIAGNOSIS — I10 Essential (primary) hypertension: Secondary | ICD-10-CM | POA: Diagnosis not present

## 2023-02-11 DIAGNOSIS — I739 Peripheral vascular disease, unspecified: Secondary | ICD-10-CM | POA: Diagnosis not present

## 2023-02-11 DIAGNOSIS — Z720 Tobacco use: Secondary | ICD-10-CM

## 2023-02-11 NOTE — Progress Notes (Signed)
Cardiology Office Note   Date:  02/11/2023   ID:  Jacob Ali, DOB April 29, 1960, MRN [id number]  PCP:  Elenore Paddy, NP  Cardiologist: Dr. Shari Prows  Chief Complaint  Patient presents with   Follow-up    12 months.      History of Present Illness: Jacob Ali is a 63 y.o. male who is here today for follow-up visit regarding peripheral arterial disease.  He has known history of coronary artery disease status post PCI to the mid LAD in 2003, GERD, HIV, hyperlipidemia, hypertension, obstructive sleep apnea, tobacco use, colon cancer and peripheral arterial disease. He was hospitalized in January of 2023 with non-ST elevation myocardial infarction.  Cardiac catheterization showed severe mid LAD stenosis that was treated with PCI and drug-eluting stent placement.    He used to be followed by Dr. Gilda Crease.  He had claudication and underwent angioplasty and stent placement to the left SFA and popliteal arteries as well as stent placement to the right common iliac artery in 2018.  Most recent Doppler studies in March of 2023 showed an ABI of 0.88 on the right and 0.72 on the left.  There was evidence of significant in-stent restenosis in the left mid SFA stent with peak velocity of 382. He is being treated medically for peripheral arterial disease given that his claudication is not lifestyle limiting.  He was hospitalized in March of last year with acute renal failure in the setting of nausea, vomiting and volume depletion.  Fortunately, his renal function returned to baseline with hydration. 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  He has been doing well with no chest pain or worsening dyspnea.  He has minimal lower extremity claudication.  Unfortunately, he continues to smoke.  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   Subdural hemorrhage (HCC)    Tubular adenoma of colon 11/03/2017   Colonoscopy Sept 9, 2019    Past Surgical History:  Procedure Laterality Date   CARDIAC CATHETERIZATION     CORONARY STENT INTERVENTION N/A 01/30/2021   Procedure: CORONARY STENT INTERVENTION;  Surgeon: Lyn Records, MD;  Location: [facility];  Service: Cardiovascular;  Laterality: N/A;   Examination under anesthesia, repair of anal fissure,  12/04/2000   LEFT HEART CATH AND CORONARY ANGIOGRAPHY N/A 01/30/2021   Procedure: LEFT HEART CATH AND CORONARY ANGIOGRAPHY;  Surgeon: Lyn Records, MD;  Location: [facility];  Service: Cardiovascular;  Laterality: N/A;   PERIPHERAL VASCULAR CATHETERIZATION Left 02/06/2016   Procedure: Lower Extremity Angiography;  Surgeon: Renford Dills, MD;  Location: [facility];  Service: Cardiovascular;  Laterality: Left;   PERIPHERAL VASCULAR CATHETERIZATION N/A 02/06/2016   Procedure: Abdominal Aortogram w/Lower Extremity;  Surgeon: Renford Dills, MD;  Location: [facility];  Service: Cardiovascular;  Laterality: N/A;   PERIPHERAL VASCULAR CATHETERIZATION  02/06/2016  Procedure: Lower Extremity Intervention;  Surgeon: Renford Dills, MD;  Location: [facility];  Service: Cardiovascular;;     Current Outpatient Medications  Medication Sig Dispense Refill   ACCU-CHEK GUIDE test strip USE UP TO 4 TIMES A DAY AS DIRECTED 300 strip 5   Accu-Chek Softclix Lancets lancets USE UP TO 4 TIMES A DAY AS DIRECTED 100 each 5   acetaminophen (TYLENOL) 500 MG tablet Take 500 mg by mouth every 6 (six) hours as needed.      bictegravir-emtricitabine-tenofovir AF (BIKTARVY) 50-200-25 MG TABS tablet Take 1 tablet by mouth daily. 30 tablet 6   carvedilol (COREG) 6.25 MG tablet Take 1 tablet (6.25 mg total) by mouth 2 (two) times daily with a meal. 180 tablet 3   clopidogrel (PLAVIX) 75 MG tablet TAKE 1 TABLET BY MOUTH EVERY DAY 90 tablet 2   Cyanocobalamin (VITAMIN B 12 PO) Take 1 tablet by mouth daily at 12 noon.     empagliflozin (JARDIANCE) 25 MG TABS tablet Take 1 tablet (25 mg total) by mouth daily before breakfast. 90 tablet 3   fenofibrate (TRICOR) 145 MG tablet TAKE 1 TABLET BY MOUTH EVERY DAY 90 tablet 1   fish oil-omega-3 fatty acids 1000 MG capsule Take 2 g by mouth daily.     fluticasone (FLONASE) 50 MCG/ACT nasal spray PLACE 1 SPRAY INTO BOTH NOSTRILS 2 (TWO) TIMES DAILY 48 mL 0   lisinopril (ZESTRIL) 20 MG tablet Take 1 tablet (20 mg total) by mouth daily. 90 tablet 3   Multiple Vitamin (MULTIVITAMIN) tablet Take 1 tablet by mouth daily. Men's 55+     nitroGLYCERIN (NITROSTAT) 0.4 MG SL tablet PLACE 1 TABLET UNDER THE TONGUE EVERY 5 MINUTES FOR 3 DOSES AS NEEDED FOR CHEST PAIN 25 tablet 3   pantoprazole (PROTONIX) 40 MG tablet Take 1 tablet (40 mg total) by mouth daily. For heartburn. 90 tablet 2   rosuvastatin (CRESTOR) 40 MG tablet Take 1 tablet (40 mg total) by mouth daily. 90 tablet 2   traZODone (DESYREL) 50 MG tablet TAKE 0.5-1 TABLETS BY MOUTH AT BEDTIME AS NEEDED FOR SLEEP. 90 tablet 1   No current facility-administered medications for this visit.    Allergies:   Atorvastatin, Metformin and related, and Tamiflu [oseltamivir phosphate]    Social History:  The patient  reports that he has been smoking cigars. He has never used smokeless tobacco. He reports current alcohol use of about 2.0 standard drinks of alcohol per week. He reports that he does not currently use drugs after having used the following drugs: Marijuana.   Family History:  The patient's family history includes Asthma in his  mother; Cancer in his father and maternal grandmother; Heart disease in his father; Hypertension in his mother; Throat cancer in his father.    ROS:  Please see the history of present illness.   Otherwise, review of systems are positive for none.   All other systems are reviewed and negative.    PHYSICAL EXAM: VS:  BP (!) 90/46 (BP Location: Left Arm, Patient Position: Sitting, Cuff Size: Normal)   Pulse 71   Ht 5\' 9"  (1.753 m)   Wt 185 lb (83.9 kg)   BMI 27.32 kg/m  , BMI Body mass index is 27.32 kg/m. GEN: Well nourished, well developed, in no acute distress  HEENT: normal  Neck: no JVD, carotid bruits, or masses Cardiac: RRR; no murmurs, rubs, or gallops,no edema  Respiratory:  clear to auscultation bilaterally, normal work of breathing  GI: soft, nontender, nondistended, + BS MS: no deformity or atrophy  Skin: warm and dry, no rash Neuro:  Strength and sensation are intact Psych: euthymic mood, full affect    EKG:  EKG is ordered today. EKG showed : Normal sinus rhythm Nonspecific T wave abnormality When compared with ECG of 31-Mar-2022 02:27, Nonspecific T wave abnormality now evident in Anterior leads   Recent Labs: 03/29/2022: Magnesium 2.2 09/13/2022: TSH 1.68 11/19/2022: ALT 19; BUN 19; Creatinine, Ser 1.20; Hemoglobin 12.5; Platelets 234; Potassium 4.0; Sodium 141    Lipid Panel    Component Value Date/Time   CHOL 101 09/13/2022 0828   CHOL 93 (L) 01/02/2022 0726   TRIG 175.0 (H) 09/13/2022 0828   HDL 29.10 (L) 09/13/2022 0828   HDL 25 (L) 01/02/2022 0726   CHOLHDL 3 09/13/2022 0828   VLDL 35.0 09/13/2022 0828   LDLCALC 36 09/13/2022 0828   LDLCALC 41 03/01/2022 1449   LDLDIRECT 98.0 01/24/2021 0954      Wt Readings from Last 3 Encounters:  02/11/23 185 lb (83.9 kg)  02/10/23 182 lb (82.6 kg)  11/22/22 183 lb 8 oz (83.2 kg)           No data to display            ASSESSMENT AND PLAN:  1.  Peripheral arterial disease: There is evidence  of significant in-stent restenosis in the distal SFA/popliteal arteries.  He has mild claudication at this time that does not seem to be lifestyle limiting.  I encouraged him to start an exercise program.  2.  Coronary artery disease involving native coronary arteries: He is doing well with no anginal symptoms.  Continue long-term clopidogrel given coronary artery disease and peripheral arterial disease.  3.  Tobacco use: He has not been able to quit smoking and I discussed the importance of smoking cessation.  4.  Essential hypertension: His blood pressure was initially low but I rechecked in the left arm and it was 120/58.  Continue same medications.  5.  Hyperlipidemia: Continue treatment with rosuvastatin.  I reviewed most recent lipid profile which showed an LDL of 36.  6.  Sleep apnea: Followed by Dr. Mayford Knife and was recently started on CPAP.   Disposition:   FU with me in 12 months  Signed,  Lorine Bears, MD  02/11/2023 8:46 AM    [facility]

## 2023-02-11 NOTE — Patient Instructions (Signed)
Medication Instructions:  Your physician recommends that you continue on your current medications as directed. Please refer to the Current Medication list given to you today.    *If you need a refill on your cardiac medications before your next appointment, please call your pharmacy*   Lab Work: None    If you have labs (blood work) drawn today and your tests are completely normal, you will receive your results only by: MyChart Message (if you have MyChart) OR A paper copy in the mail If you have any lab test that is abnormal or we need to change your treatment, we will call you to review the results.   Testing/Procedures: None   Follow-Up: At [facility], you and your health needs are our priority.  As part of our continuing mission to provide you with exceptional heart care, we have created designated Provider Care Teams.  These Care Teams include your primary Cardiologist (physician) and Advanced Practice Providers (APPs -  Physician Assistants and Nurse Practitioners) who all work together to provide you with the care you need, when you need it.  We recommend signing up for the patient portal called "MyChart".  Sign up information is provided on this After Visit Summary.  MyChart is used to connect with patients for Virtual Visits (Telemedicine).  Patients are able to view lab/test results, encounter notes, upcoming appointments, etc.  Non-urgent messages can be sent to your provider as well.   To learn more about what you can do with MyChart, go to [email or web address].    Your next appointment:   1 year(s)  The format for your next appointment:   In Person  Provider:   Lorine Bears, MD    Other Instructions

## 2023-02-18 ENCOUNTER — Ambulatory Visit: Payer: Self-pay | Admitting: Nurse Practitioner

## 2023-02-18 NOTE — Telephone Encounter (Signed)
 Reason for Disposition ?? Third attempt to contact caller AND no contact made. Phone number verified. ? ?Protocols used: No Contact or Duplicate Contact Call-A-AH ? ?

## 2023-02-18 NOTE — Telephone Encounter (Signed)
 3rd attempt- LVM

## 2023-02-18 NOTE — Telephone Encounter (Signed)
Copied from CRM [id number]. Topic: Clinical - Pink Word Triage >> Feb 18, 2023  3:09 PM Jacob Ali wrote: Reason for Triage: Pt is needing to reschedule his F/U but stated that he is currently having pain in his shoulders and neck area.

## 2023-02-24 ENCOUNTER — Other Ambulatory Visit: Payer: Self-pay | Admitting: *Deleted

## 2023-02-24 DIAGNOSIS — Z122 Encounter for screening for malignant neoplasm of respiratory organs: Secondary | ICD-10-CM

## 2023-02-24 DIAGNOSIS — Z87891 Personal history of nicotine dependence: Secondary | ICD-10-CM

## 2023-02-24 DIAGNOSIS — F17210 Nicotine dependence, cigarettes, uncomplicated: Secondary | ICD-10-CM

## 2023-02-25 ENCOUNTER — Telehealth: Payer: Self-pay | Admitting: Cardiology

## 2023-02-25 DIAGNOSIS — I10 Essential (primary) hypertension: Secondary | ICD-10-CM

## 2023-02-25 DIAGNOSIS — R0683 Snoring: Secondary | ICD-10-CM

## 2023-02-25 DIAGNOSIS — G4733 Obstructive sleep apnea (adult) (pediatric): Secondary | ICD-10-CM

## 2023-02-25 DIAGNOSIS — I2510 Atherosclerotic heart disease of native coronary artery without angina pectoris: Secondary | ICD-10-CM

## 2023-02-25 NOTE — Telephone Encounter (Signed)
Patient states during virtual appointment with Dr. Mayford Knife he was advised that [facility] would contact him regarding having equipment adjusted. He states he was told to follow up in 2 weeks if he hasn't heard from them and he hasn't heard anything. Please advise.Marland Kitchen

## 2023-03-03 NOTE — Telephone Encounter (Signed)
Per dr Mayford Knife.Marland KitchenMarland KitchenI will change to a nasal pillow mask with chin strap to see if that works better.   Order placed to [facility] for nasal pillow mask with chin strap.

## 2023-03-06 ENCOUNTER — Other Ambulatory Visit: Payer: Self-pay

## 2023-03-06 ENCOUNTER — Other Ambulatory Visit: Payer: Self-pay | Admitting: Pharmacy Technician

## 2023-03-06 NOTE — Progress Notes (Signed)
Specialty Pharmacy Refill Coordination Note  Jacob Ali is a 63 y.o. male contacted today regarding refills of specialty medication(s) Bictegravir-Emtricitab-Tenofov [name])   Patient requested Delivery   Delivery date: 03/14/23   Verified address: [address]  [place]   Medication will be filled on 03/13/23.

## 2023-03-10 ENCOUNTER — Ambulatory Visit (HOSPITAL_BASED_OUTPATIENT_CLINIC_OR_DEPARTMENT_OTHER)
Admission: RE | Admit: 2023-03-10 | Discharge: 2023-03-10 | Disposition: A | Discharge status: Home / Self Care | Payer: Medicaid Other | Source: Ambulatory Visit | Attending: Acute Care | Admitting: Acute Care

## 2023-03-10 ENCOUNTER — Ambulatory Visit (INDEPENDENT_AMBULATORY_CARE_PROVIDER_SITE_OTHER): Payer: Medicaid Other | Admitting: Acute Care

## 2023-03-10 DIAGNOSIS — Z87891 Personal history of nicotine dependence: Secondary | ICD-10-CM

## 2023-03-10 DIAGNOSIS — F17210 Nicotine dependence, cigarettes, uncomplicated: Secondary | ICD-10-CM

## 2023-03-10 DIAGNOSIS — Z122 Encounter for screening for malignant neoplasm of respiratory organs: Secondary | ICD-10-CM

## 2023-03-10 NOTE — Progress Notes (Addendum)
 Provider Attestation I agree with the documentation of the Shared Decision Making visit,  smoking cessation counseling if appropriate, and verification or eligibility for lung cancer screening as documented by the RN Nurse Navigator.   Raejean Bullock, MSN, AGACNP-BC [facility] See Amion for personal pager PCCM on call pager [phone]      Virtual Visit via Telephone Note  I connected with Jacob Ali on 03/10/23 at 11:00 AM EST by telephone and verified that I am speaking with the correct person using two identifiers.  Location: Patient: Jacob Ali Provider: Alyse Bach, RN   I discussed the limitations, risks, security and privacy concerns of performing an evaluation and management service by telephone and the availability of in person appointments. I also discussed with the patient that there may be a patient responsible charge related to this service. The patient expressed understanding and agreed to proceed.    Shared Decision Making Visit Lung Cancer Screening Program [phone])   Eligibility: Age 63 y.o. Pack Years Smoking History Calculation 26 (# packs/per year x # years smoked) Recent History of coughing up blood  no Unexplained weight loss? no ( >Than 15 pounds within the last 6 months ) Prior History Lung / other cancer no (Diagnosis within the last 5 years already requiring surveillance chest CT Scans). Smoking Status Current Smoker Former Smokers: Years since quit: n/a  Quit Date: n/a  Visit Components: Discussion included one or more decision making aids. yes Discussion included risk/benefits of screening. yes Discussion included potential follow up diagnostic testing for abnormal scans. yes Discussion included meaning and risk of over diagnosis. yes Discussion included meaning and risk of False Positives. yes Discussion included meaning of total radiation exposure. yes  Counseling Included: Importance of  adherence to annual lung cancer LDCT screening. yes Impact of comorbidities on ability to participate in the program. yes Ability and willingness to under diagnostic treatment. yes  Smoking Cessation Counseling: Current Smokers:  Discussed importance of smoking cessation. yes Information about tobacco cessation classes and interventions provided to patient. yes Patient provided with "ticket" for LDCT Scan. no Symptomatic Patient. no  Counseling(Intermediate counseling: > three minutes) 99406 Diagnosis Code: Tobacco Use Z72.0 Asymptomatic Patient yes  Counseling (Intermediate counseling: > three minutes counseling) B1478 Former Smokers:  Discussed the importance of maintaining cigarette abstinence. yes Diagnosis Code: Personal History of Nicotine  Dependence. G95.621 Information about tobacco cessation classes and interventions provided to patient. Yes Patient provided with "ticket" for LDCT Scan. no Written Order for Lung Cancer Screening with LDCT placed in Epic. Yes (CT Chest Lung Cancer Screening Low Dose W/O CM) [id number] Z12.2-Screening of respiratory organs Z87.891-Personal history of nicotine  dependence   Alyse Bach, RN

## 2023-03-10 NOTE — Patient Instructions (Signed)

## 2023-03-13 ENCOUNTER — Other Ambulatory Visit: Payer: Self-pay

## 2023-03-21 ENCOUNTER — Ambulatory Visit: Admitting: Nurse Practitioner

## 2023-03-21 ENCOUNTER — Ambulatory Visit

## 2023-03-21 VITALS — BP 136/84 | HR 83 | Temp 98.3°F | Ht 69.0 in | Wt 181.4 lb

## 2023-03-21 DIAGNOSIS — M542 Cervicalgia: Secondary | ICD-10-CM | POA: Diagnosis not present

## 2023-03-21 MED ORDER — CYCLOBENZAPRINE HCL 5 MG PO TABS: 5.0000 mg | ORAL_TABLET | Freq: Three times a day (TID) | ORAL | 1 refills | Status: DC | PRN

## 2023-03-21 NOTE — Progress Notes (Signed)
 Established Patient Office Visit  Subjective   Patient ID: Jacob Ali, male    DOB: 06-Jul-1960  Age: 63 y.o. MRN: [id number]  Chief Complaint  Patient presents with   Neck Pain    Comes and goes, radiate to the shoulder area     Onset is chronic however over the last 3 months has been daily which is a change is usually this is more intermittent with episodes of flareups.  Pain is located in bilateral shoulders, left being worse that radiates up to the neck.  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  He reports history of rotator cuff tendinitis but had declined physical therapy and was unable to do at home exercise due to not tolerating this.  Reports pain in neck and shoulders is 10 out of 10 but does come down to 4 out of 10 with use of Tylenol in the morning naproxen in the evening.  He is having some headaches intermittently, reports more tearing of the eyes but otherwise no visual changes, sometimes tingling in the hands.    ROS: see HPI    Objective:     BP 136/84   Pulse 83   Temp 98.3 F (36.8 C) (Temporal)   Ht 5\' 9"  (1.753 m)   Wt 181 lb 6 oz (82.3 kg)   SpO2 94%   BMI 26.78 kg/m    Physical Exam Vitals reviewed.  Constitutional:      Appearance: Normal appearance.  HENT:     Head: Normocephalic and atraumatic.  Pulmonary:     Effort: Pulmonary effort is normal.  Musculoskeletal:     Cervical back: Neck supple. Tenderness present.       Back:     Comments: Mild tenderness to palpation  Skin:    General: Skin is warm and dry.  Neurological:     Mental Status: He is alert and oriented to person, place, and time.     Sensory: Sensation is intact.     Motor: Motor function is intact.     Gait: Gait is intact.  Psychiatric:        Mood and Affect: Mood normal.        Behavior: Behavior normal.        Thought Content: Thought content  normal.        Judgment: Judgment normal.      No results found for any visits on 03/21/23.    The ASCVD Risk score (Arnett DK, et al., 2019) failed to calculate for the following reasons:   Risk score cannot be calculated because patient has a medical history suggesting prior/existing ASCVD    Assessment & Plan:   Problem List Items Addressed This Visit       Other   Cervicalgia - Primary   Chronic, but seems to have gotten more frequent and worse over the last 3 months.  No red flag signs or symptoms identified.  For now we will trial cyclobenzaprine 5 to 10 mg every 8 hours as needed, refer to sports medicine for assistance with management, and obtain cervical spine x-ray for further evaluation.  Patient was warned to avoid driving or operating heavy machinery if he takes muscle relaxant.  He reports his understanding.      Relevant Medications   cyclobenzaprine (FLEXERIL) 5 MG tablet   Other Relevant Orders   DG Cervical Spine Complete  Ambulatory referral to Sports Medicine    Return if symptoms worsen or fail to improve, for F/U with Trana Ressler as scheudled in may .    Elenore Paddy, NP

## 2023-03-21 NOTE — Assessment & Plan Note (Signed)
 Chronic, but seems to have gotten more frequent and worse over the last 3 months.  No red flag signs or symptoms identified.  For now we will trial cyclobenzaprine 5 to 10 mg every 8 hours as needed, refer to sports medicine for assistance with management, and obtain cervical spine x-ray for further evaluation.  Patient was warned to avoid driving or operating heavy machinery if he takes muscle relaxant.  He reports his understanding.

## 2023-03-23 ENCOUNTER — Other Ambulatory Visit: Payer: Self-pay | Admitting: Nurse Practitioner

## 2023-03-31 ENCOUNTER — Other Ambulatory Visit: Payer: Self-pay

## 2023-03-31 MED ORDER — ROSUVASTATIN CALCIUM 40 MG PO TABS: 40.0000 mg | ORAL_TABLET | Freq: Every day | ORAL | 2 refills | Status: DC

## 2023-04-01 ENCOUNTER — Other Ambulatory Visit: Payer: Self-pay

## 2023-04-01 DIAGNOSIS — Z122 Encounter for screening for malignant neoplasm of respiratory organs: Secondary | ICD-10-CM

## 2023-04-01 DIAGNOSIS — F17210 Nicotine dependence, cigarettes, uncomplicated: Secondary | ICD-10-CM

## 2023-04-01 DIAGNOSIS — Z87891 Personal history of nicotine dependence: Secondary | ICD-10-CM

## 2023-04-02 ENCOUNTER — Ambulatory Visit: Payer: Medicaid Other | Admitting: Nurse Practitioner

## 2023-04-03 ENCOUNTER — Other Ambulatory Visit: Payer: Self-pay

## 2023-04-03 ENCOUNTER — Ambulatory Visit: Payer: Medicaid Other | Attending: Cardiology | Admitting: Cardiology

## 2023-04-03 VITALS — BP 138/66 | HR 84 | Wt 179.0 lb

## 2023-04-03 DIAGNOSIS — I10 Essential (primary) hypertension: Secondary | ICD-10-CM | POA: Diagnosis not present

## 2023-04-03 DIAGNOSIS — G4733 Obstructive sleep apnea (adult) (pediatric): Secondary | ICD-10-CM | POA: Diagnosis not present

## 2023-04-03 NOTE — Progress Notes (Signed)
 SLEEP MEDICINE VIRTUAL  NOTE via Video Note   Because of Jacob Ali's co-morbid illnesses, he is at least at moderate risk for complications without adequate follow up.  This format is felt to be most appropriate for this patient at this time.  All issues noted in this document were discussed and addressed.  A limited physical exam was performed with this format.  Please refer to the patient's chart for his consent to telehealth for [facility].      Date:  04/03/2023   ID:  Jacob Ali, DOB Nov 27, 1960, MRN [id number] The patient was identified using 2 identifiers.  Patient Location: Home Provider Location: Home Office   PCP:  Jacob Paddy, NP   [facility] Providers Cardiologist:  Jacob Schultz, MD Cardiology APP:  Jacob Lecher, PA-C     Evaluation Performed:  Followup  Chief Complaint:  OSA  History of Present Illness:    Jacob Ali is a 63 y.o. male with a hx of CAD, HTN, HLD, HIV, subdural hematoma who was seen by Tereso Newcomer, PA in 10/2022 and complained of excessive daytime sleepiness and was worried he may have OSA. He underwent HST showing Severe OSA with an AHI of 52/hr and mild CSA with an AHI of 14.3/hr and was started on auto CPAP from 4 to 15cm H2O.  He is now referred for sleep medicine consultation for evaluation and treatment of OSA.  At his last office visit 8 weeks ago his AHI looked great at 2.9/h on auto CPAP from 4-15 with good compliance.  He did have a very large mask leak and was not tolerating the mask leak due to facial swelling and having to pull the mask so tight due to the mask leaking.  I changed him to a nasal pillow mask with chinstrap.  He is now back to see how he is doing.  He is doing well with his PAP device and thinks that he has gotten used to it.  He got the nasal pillow mask and tried it but felt there were too many straps because he had to use a chin strap as well.  He says that the mask also burned his  nose some.  He also did not like the tubing as it was too long and would not heat it well.    He decided to go back to his old head gear. He apparently had been using the medium cushion with his FFM that he originally was given but decided to try the small cushion instead and that has worked very well for him.  He feels the pressure is adequate.  He denies any significant mouth or nasal dryness or nasal congestion.  He does not think that he snores.    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   Subdural hemorrhage (HCC)    Tubular  adenoma of colon 11/03/2017   Colonoscopy Sept 9, 2019   Past Surgical History:  Procedure Laterality Date   CARDIAC CATHETERIZATION     CORONARY STENT INTERVENTION N/A 01/30/2021   Procedure: CORONARY STENT INTERVENTION;  Surgeon: Lyn Records, MD;  Location: [facility] INVASIVE CV LAB;  Service: Cardiovascular;  Laterality: N/A;   Examination under anesthesia, repair of anal fissure,  12/04/2000   LEFT HEART CATH AND CORONARY ANGIOGRAPHY N/A 01/30/2021   Procedure: LEFT HEART CATH AND CORONARY ANGIOGRAPHY;  Surgeon: Lyn Records, MD;  Location: [facility] INVASIVE CV LAB;  Service: Cardiovascular;  Laterality: N/A;   PERIPHERAL VASCULAR CATHETERIZATION Left 02/06/2016   Procedure: Lower Extremity Angiography;  Surgeon: Renford Dills, MD;  Location: [facility] INVASIVE CV LAB;  Service: Cardiovascular;  Laterality: Left;   PERIPHERAL VASCULAR CATHETERIZATION N/A 02/06/2016   Procedure:  Abdominal Aortogram w/Lower Extremity;  Surgeon: Renford Dills, MD;  Location: [facility] INVASIVE CV LAB;  Service: Cardiovascular;  Laterality: N/A;   PERIPHERAL VASCULAR CATHETERIZATION  02/06/2016   Procedure: Lower Extremity Intervention;  Surgeon: Renford Dills, MD;  Location: [facility] INVASIVE CV LAB;  Service: Cardiovascular;;     Current Meds  Medication Sig   ACCU-CHEK GUIDE test strip USE UP TO 4 TIMES A DAY AS DIRECTED   Accu-Chek Softclix Lancets lancets USE UP TO 4 TIMES A DAY AS DIRECTED   acetaminophen (TYLENOL) 500 MG tablet Take 500 mg by mouth every 6 (six) hours as needed.   bictegravir-emtricitabine-tenofovir AF (BIKTARVY) 50-200-25 MG TABS tablet Take 1 tablet by mouth daily.   carvedilol (COREG) 6.25 MG tablet Take 1 tablet (6.25 mg total) by mouth 2 (two) times daily with a meal.   clopidogrel (PLAVIX) 75 MG tablet TAKE 1 TABLET BY MOUTH EVERY DAY   Cyanocobalamin (VITAMIN B 12 PO) Take 1 tablet by mouth daily at 12 noon.   cyclobenzaprine (FLEXERIL) 5 MG tablet Take 1-2 tablets (5-10 mg total) by mouth 3 (three) times daily as needed for muscle spasms.   empagliflozin (JARDIANCE) 25 MG TABS tablet Take 1 tablet (25 mg total) by mouth daily before breakfast.   fenofibrate (TRICOR) 145 MG tablet TAKE 1 TABLET BY MOUTH EVERY DAY   fish oil-omega-3 fatty acids 1000 MG capsule Take 2 g by mouth daily.   fluticasone (FLONASE) 50 MCG/ACT nasal spray PLACE 1 SPRAY INTO BOTH NOSTRILS 2 (TWO) TIMES DAILY   glucosamine-chondroitin 500-400 MG tablet Take 1 tablet by mouth in the morning and at bedtime.   lisinopril (ZESTRIL) 20 MG tablet Take 1 tablet (20 mg total) by mouth daily.   Multiple Vitamin (MULTIVITAMIN) tablet Take 1 tablet by mouth daily. Men's 55+   nitroGLYCERIN (NITROSTAT) 0.4 MG SL tablet PLACE 1 TABLET UNDER THE TONGUE EVERY 5 MINUTES FOR 3 DOSES AS NEEDED FOR CHEST PAIN   pantoprazole (PROTONIX) 40 MG tablet Take 1 tablet (40 mg total) by mouth daily. For  heartburn.   rosuvastatin (CRESTOR) 40 MG tablet Take 1 tablet (40 mg total) by mouth daily.     Allergies:   Atorvastatin, Metformin and related, and Tamiflu [oseltamivir phosphate]   Social History   Tobacco Use   Smoking status: Every Day    Current packs/day: 0.50    Average packs/day: 0.5 packs/day for 52.2 years (26.1 ttl pk-yrs)    Types: Cigars, Cigarettes    Start date: 1973   Smokeless tobacco: Never   Tobacco comments:    1/2PPD Mr Bena was wearing a nicotine patch today and says  he plans to quit smoking. Given the phone number to 1-800=quit-now   Vaping Use   Vaping status: Former  Substance Use Topics   Alcohol use: Yes    Alcohol/week: 2.0 standard drinks of alcohol    Types: 2 Standard drinks or equivalent per week    Comment: 1-2 times a week   Drug use: Not Currently    Types: Marijuana    Comment: 2 per month     Family Hx: The patient's family history includes Asthma in his mother; Cancer in his father and maternal grandmother; Heart disease in his father; Hypertension in his mother; Throat cancer in his father.  ROS:   Please see the history of present illness.     All other systems reviewed and are negative.   Prior Sleep studies:   The following studies were reviewed today:  HST, PAP compliance download  Labs/Other Tests and Data Reviewed:     Recent Labs: 09/13/2022: TSH 1.68 11/19/2022: ALT 19; BUN 19; Creatinine, Ser 1.20; Hemoglobin 12.5; Platelets 234; Potassium 4.0; Sodium 141    Wt Readings from Last 3 Encounters:  04/03/23 179 lb (81.2 kg)  03/21/23 181 lb 6 oz (82.3 kg)  02/11/23 185 lb (83.9 kg)     Risk Assessment/Calculations:      STOP-Bang Score:  6      Objective:    Vital Signs:  BP 138/66 (Cuff Size: Normal)   Pulse 84   Wt 179 lb (81.2 kg)   BMI 26.43 kg/m   Well nourished, well developed male in no acute distress. Well appearing, alert and conversant, regular work of breathing,  good skin color  Eyes-  anicteric mouth- oral mucosa is pink  neuro- grossly intact skin- no apparent rash or lesions or cyanosis ASSESSMENT & PLAN:    OSA - The patient is tolerating PAP therapy well without any problems. The PAP download performed by his DME was personally reviewed and interpreted by me today and showed an AHI of 3.3 /hr on auto CPAP from 4-15 cm H2O with 87 % compliance in using more than 4 hours nightly.  The patient has been using and benefiting from PAP use and will continue to benefit from therapy.   HTN -His BP is controlled  -Continue prescription drug management with carvedilol 6.25 mg twice daily and lisinopril 20 mg daily with as needed refills -I have personally reviewed and interpreted outside labs performed by patient's PCP which showed serum creatinine 1.2 and potassium 4 on 11/19/2022  Time:   Today, I have spent 15 minutes with the patient with telehealth technology discussing the above problems.     Medication Adjustments/Labs and Tests Ordered: Current medicines are reviewed at length with the patient today.  Concerns regarding medicines are outlined above.   Tests Ordered: No orders of the defined types were placed in this encounter.   Medication Changes: No orders of the defined types were placed in this encounter.   Follow Up:1 year  Signed, Armanda Magic, MD  04/03/2023 9:13 AM    [facility]

## 2023-04-03 NOTE — Progress Notes (Signed)
 Specialty Pharmacy Refill Coordination Note  Jacob Ali is a 63 y.o. male contacted today regarding refills of specialty medication(s) Bictegravir-Emtricitab-Tenofov [facility])   Patient requested (Patient-Rptd) Delivery   Delivery date: (Patient-Rptd) 04/11/23   Verified address: (Patient-Rptd) [address] [place], [place], [place] [address]   Medication will be filled on 04/10/23.

## 2023-04-03 NOTE — Patient Instructions (Signed)
 Follow-Up: At [facility], you and your health needs are our priority.  As part of our continuing mission to provide you with exceptional heart care, we have created designated Provider Care Teams.  These Care Teams include your primary Cardiologist (physician) and Advanced Practice Providers (APPs -  Physician Assistants and Nurse Practitioners) who all work together to provide you with the care you need, when you need it.  Your next appointment:   1 year(s)  Provider:   Armanda Magic, MD   1st Floor: - Lobby - Registration  - Pharmacy  - Lab - Cafe  2nd Floor: - PV Lab - Diagnostic Testing (echo, CT, nuclear med)  3rd Floor: - Vacant  4th Floor: - TCTS (cardiothoracic surgery) - AFib Clinic - Structural Heart Clinic - Vascular Surgery  - Vascular Ultrasound  5th Floor: - HeartCare Cardiology (general and EP) - Clinical Pharmacy for coumadin, hypertension, lipid, weight-loss medications, and med management appointments    Valet parking services will be available as well.

## 2023-04-09 ENCOUNTER — Encounter: Payer: Self-pay | Admitting: Nurse Practitioner

## 2023-04-09 NOTE — Progress Notes (Unsigned)
 I, Rolland Bimler am a scribe for Dr. Denyse Amass.    Jacob Ali is a 63 y.o. male who presents to [facility] at [facility] today for neck pain that's chronic in nature, worsening over the last 3 months. Pt locates pain to both sides of neck, lower back, shoulders, and hands are tingling. It has hurt him for years. 2023 it started flaring up after PT using the seated elliptical. While sitting or reaching behind him his neck and lower back hurts the most. When he walks for a while his lower back starts with burning sensation. Voltaren gel did not help neck issues.   Radiates: yes to shoulders UE Numbness/tingling: yes UE Weakness: yes Aggravates: only when sitting Treatments tried: Voltaren gel, naproxen, muscle relaxer  Dx imaging: 03/21/23 C-spine XR  Pertinent review of systems: No fevers or chills  Relevant historical information: Diabetes.  Coronary artery disease. Controlled HIV.  Exam:  BP (!) 142/80   Pulse 79   Ht 5\' 9"  (1.753 m)   Wt 181 lb (82.1 kg)   SpO2 92%   BMI 26.73 kg/m  General: Well Developed, well nourished, and in no acute distress.   MSK: C-spine: Normal appearing Nontender to palpation spinal midline. Decreased cervical motion. Upper extremity strength is intact.  Shoulders bilaterally normal-appearing normal motion some pain with abduction.  Intact strength.  L-spine normal motion.     Lab and Radiology Results  EXAM: CERVICAL SPINE - COMPLETE 4+ VIEW   COMPARISON:  Cervical spine radiograph 07/12/2021   FINDINGS: Reversal of the normal cervical lordosis. No acute fracture or dislocation. Multilevel degenerative disc disease most pronounced C5-6. Lung apices clear. Lateral masses articulate appropriately with the dens.   IMPRESSION: Multilevel degenerative disc disease most pronounced C5-6.     Electronically Signed   By: Annia Belt M.D.   On: 04/08/2023 21:36 I, Clementeen Graham, personally (independently)  visualized and performed the interpretation of the images attached in this note.  CT scan images lumbar spine obtained during CT scan abdomen and pelvis March 2024 personally and independently interpreted today. Minimal degenerative changes.  No acute fractures.   Assessment and Plan: 63 y.o. male with chronic neck pain.  This has been a chronic ongoing issue for years worsening recently.  He does have degenerative changes seen on cervical spine x-ray earlier this month which could be contributory.  Major issue is muscle spasm and dysfunction.  Plan for physical therapy.  Recommend cyclobenzaprine to use as needed primarily at bedtime.  Additionally he has chronic low back pain again mostly due to muscle spasm and dysfunction.  Again physical therapy referral.  Lastly he notes chronic bilateral shoulder pain due to rotator cuff impingement.  Again physical therapy referral.  Will proceed with x-rays shoulders and lumbar spine if not improved.   PDMP not reviewed this encounter. Orders Placed This Encounter  Procedures   Ambulatory referral to Physical Therapy    Referral Priority:   Routine    Referral Type:   Physical Medicine    Referral Reason:   Specialty Services Required    Requested Specialty:   Physical Therapy    Number of Visits Requested:   1   Meds ordered this encounter  Medications   cyclobenzaprine (FLEXERIL) 5 MG tablet    Sig: Take 1-2 tablets (5-10 mg total) by mouth 3 (three) times daily as needed for muscle spasms.    Dispense:  60 tablet    Refill:  1  Discussed warning signs or symptoms. Please see discharge instructions. Patient expresses understanding.   The above documentation has been reviewed and is accurate and complete Clementeen Graham, M.D.

## 2023-04-10 ENCOUNTER — Ambulatory Visit (INDEPENDENT_AMBULATORY_CARE_PROVIDER_SITE_OTHER): Admitting: Family Medicine

## 2023-04-10 VITALS — BP 142/80 | HR 79 | Ht 69.0 in | Wt 181.0 lb

## 2023-04-10 DIAGNOSIS — M25511 Pain in right shoulder: Secondary | ICD-10-CM | POA: Diagnosis not present

## 2023-04-10 DIAGNOSIS — M542 Cervicalgia: Secondary | ICD-10-CM

## 2023-04-10 DIAGNOSIS — M25512 Pain in left shoulder: Secondary | ICD-10-CM

## 2023-04-10 DIAGNOSIS — G8929 Other chronic pain: Secondary | ICD-10-CM | POA: Diagnosis not present

## 2023-04-10 DIAGNOSIS — M545 Low back pain, unspecified: Secondary | ICD-10-CM | POA: Diagnosis not present

## 2023-04-10 MED ORDER — CYCLOBENZAPRINE HCL 5 MG PO TABS: 5.0000 mg | ORAL_TABLET | Freq: Three times a day (TID) | ORAL | 1 refills | Status: DC | PRN

## 2023-04-10 NOTE — Patient Instructions (Signed)
 Refer to PT on [employer]. Follow up in 8 weeks.

## 2023-04-11 ENCOUNTER — Ambulatory Visit: Payer: Medicaid Other | Attending: Internal Medicine | Admitting: Cardiology

## 2023-04-11 ENCOUNTER — Encounter: Payer: Self-pay | Admitting: Cardiology

## 2023-04-11 VITALS — BP 148/76 | HR 80 | Ht 69.0 in | Wt 182.0 lb

## 2023-04-11 DIAGNOSIS — Z21 Asymptomatic human immunodeficiency virus [HIV] infection status: Secondary | ICD-10-CM

## 2023-04-11 DIAGNOSIS — I10 Essential (primary) hypertension: Secondary | ICD-10-CM

## 2023-04-11 DIAGNOSIS — G4733 Obstructive sleep apnea (adult) (pediatric): Secondary | ICD-10-CM

## 2023-04-11 DIAGNOSIS — I2510 Atherosclerotic heart disease of native coronary artery without angina pectoris: Secondary | ICD-10-CM

## 2023-04-11 DIAGNOSIS — I739 Peripheral vascular disease, unspecified: Secondary | ICD-10-CM

## 2023-04-11 NOTE — Patient Instructions (Signed)
 Medication Instructions:  The current medical regimen is effective;  continue present plan and medications.  *If you need a refill on your cardiac medications before your next appointment, please call your pharmacy*  Follow-Up: At [facility], you and your health needs are our priority.  As part of our continuing mission to provide you with exceptional heart care, our providers are all part of one team.  This team includes your primary Cardiologist (physician) and Advanced Practice Providers or APPs (Physician Assistants and Nurse Practitioners) who all work together to provide you with the care you need, when you need it.  Your next appointment:   1 year(s)  Provider:   Tereso Newcomer, PA-C          We recommend signing up for the patient portal called "MyChart".  Sign up information is provided on this After Visit Summary.  MyChart is used to connect with patients for Virtual Visits (Telemedicine).  Patients are able to view lab/test results, encounter notes, upcoming appointments, etc.  Non-urgent messages can be sent to your provider as well.   To learn more about what you can do with MyChart, go to [email or web address].         1st Floor: - Lobby - Registration  - Pharmacy  - Lab - Cafe  2nd Floor: - PV Lab - Diagnostic Testing (echo, CT, nuclear med)  3rd Floor: - Vacant  4th Floor: - TCTS (cardiothoracic surgery) - AFib Clinic - Structural Heart Clinic - Vascular Surgery  - Vascular Ultrasound  5th Floor: - HeartCare Cardiology (general and EP) - Clinical Pharmacy for coumadin, hypertension, lipid, weight-loss medications, and med management appointments    Valet parking services will be available as well.

## 2023-04-11 NOTE — Progress Notes (Signed)
 Cardiology Office Note:  .   Date:  04/11/2023  ID:  OBRIEN HUSKINS, DOB [birth year], MRN [id number] PCP: Jacob Paddy, NP  [facility] Providers Cardiologist:  Donato Schultz, MD Cardiology APP:  Kennon Rounds  PV Cardiologist:  Lorine Bears, MD     History of Present Illness: .   Jacob Ali is a 63 y.o. male Discussed the use of AI scribe software for clinical note transcription with the patient, who gave verbal consent to proceed.  History of Present Illness Jacob Ali "Jacob Ali" is a 63 year old male with peripheral arterial disease, obstructive sleep apnea, and coronary artery disease who presents for follow-up.  He has coronary artery disease with a drug-eluting stent placed in the LAD in 2023 and residual disease in the diagonal branches and RCA managed medically. He experienced a type two myocardial infarction in March 2024 in the setting of gastroenteritis and AKI, which was managed medically. He occasionally experiences stress-induced chest discomfort and uses nitroglycerin as needed. He is on Plavix 75 mg daily, Crestor 40 mg daily, Coreg 6.25 mg twice daily, and fenofibrate 145 mg daily. His LDL was 36 in August 2024 with triglycerides at 175. No current chest pain or difficulties when walking upstairs.  He has a history of peripheral arterial disease with prior stenting of the popliteal superficial femoral artery area. He finds it difficult to exercise due to back pain from degenerative disc disease.  He has obstructive sleep apnea and uses a CPAP machine. He initially had difficulty with the CPAP mask fit but has found a small mask that fits better. He experiences waking up every two hours despite the CPAP use, although his apnea events have decreased to less than five per hour. He feels that the CPAP is helping his breathing but still struggles with sleep quality.  He reports difficulties with his hips, shoulders, and neck, which he attributes to degenerative  disc disease. He has seen a sports doctor and is looking forward to starting physical therapy. He finds it challenging to exercise due to pain but is motivated to improve his physical activity.  He has well-controlled diabetes with a hemoglobin A1c of 4.2 and is on Jardiance 25 mg daily.  He takes [employer] for HIV. New shelter dog (shepherd mix, watches TV)     Studies Reviewed: .        Results LABS LDL: 36 (08/2022) Triglycerides: 175 (08/2022) Hemoglobin A1c: 4.2  RADIOLOGY Chest X-ray: No malignancy, atherosclerosis of the aorta, coronary calcification, visible stent  DIAGNOSTIC REPORTS Echocardiogram: Normal ejection fraction with no wall motion abnormality (03/31/2022) Risk Assessment/Calculations:       STOP-Bang Score:  6      Physical Exam:   VS:  BP (!) 148/76   Pulse 80   Ht 5\' 9"  (1.753 m)   Wt 182 lb (82.6 kg)   SpO2 91%   BMI 26.88 kg/m    Wt Readings from Last 3 Encounters:  04/11/23 182 lb (82.6 kg)  04/10/23 181 lb (82.1 kg)  04/03/23 179 lb (81.2 kg)    GEN: Well nourished, well developed in no acute distress NECK: No JVD; No carotid bruits CARDIAC: RRR, no murmurs, no rubs, no gallops RESPIRATORY:  Clear to auscultation without rales, wheezing or rhonchi  ABDOMEN: Soft, non-tender, non-distended EXTREMITIES:  No edema; No deformity   ASSESSMENT AND PLAN: .    Assessment and Plan Assessment & Plan Coronary Artery Disease (CAD) CAD with drug-eluting stent  in LAD (2023). Residual disease in diagonal branches and RCA managed medically. Occasional stress-induced angina managed with nitroglycerin. Echocardiogram (March 2024) showed normal ejection fraction, no wall motion abnormality. Current medications: Plavix, Crestor, Coreg. - Continue Plavix 75 mg daily, Crestor 40 mg daily, Coreg 6.25 mg twice daily - Use nitroglycerin as needed for angina  Peripheral Arterial Disease (PAD) (Dr. Kirke Corin) PAD with previous intervention in popliteal superficial  femoral artery. Exercise limited by degenerative disc disease. Vascular specialist recommended exercise; however, back pain limits activity. Engaging in physical therapy with sports medicine to improve mobility. - Encourage physical therapy for mobility and PAD management - Continue follow-up with sports medicine  Type 2 Diabetes Mellitus Well-controlled diabetes with hemoglobin A1c of 4.2. Jardiance 25 mg daily effective for glycemic control and cardiac benefits. - Continue Jardiance 25 mg daily  Obstructive Sleep Apnea (OSA) OSA managed with CPAP. Difficulty with mask fit and humidity settings causing disrupted sleep. Adjusted mask size improved fit, but frequent awakenings persist. CPAP reduced apnea events to <5/hour, indicating effective management despite sleep disruptions. - Continue CPAP with adjusted mask size - Monitor and adjust humidity settings as needed  HIV HIV managed with Biktarvy. - Continue Biktarvy as prescribed  Follow-up Current management plan is effective. Tereso Newcomer PA to follow up in one year unless issues arise. - Schedule follow-up with. Tereso Newcomer PA in one year           Signed, Donato Schultz, MD

## 2023-04-23 ENCOUNTER — Other Ambulatory Visit: Payer: Self-pay

## 2023-04-23 ENCOUNTER — Ambulatory Visit: Attending: Family Medicine

## 2023-04-23 DIAGNOSIS — M545 Low back pain, unspecified: Secondary | ICD-10-CM | POA: Insufficient documentation

## 2023-04-23 DIAGNOSIS — R2689 Other abnormalities of gait and mobility: Secondary | ICD-10-CM | POA: Diagnosis present

## 2023-04-23 DIAGNOSIS — M542 Cervicalgia: Secondary | ICD-10-CM | POA: Insufficient documentation

## 2023-04-23 DIAGNOSIS — M25512 Pain in left shoulder: Secondary | ICD-10-CM | POA: Diagnosis present

## 2023-04-23 DIAGNOSIS — M5459 Other low back pain: Secondary | ICD-10-CM | POA: Diagnosis present

## 2023-04-23 DIAGNOSIS — G8929 Other chronic pain: Secondary | ICD-10-CM | POA: Insufficient documentation

## 2023-04-23 DIAGNOSIS — M6281 Muscle weakness (generalized): Secondary | ICD-10-CM | POA: Insufficient documentation

## 2023-04-23 DIAGNOSIS — M25511 Pain in right shoulder: Secondary | ICD-10-CM | POA: Diagnosis present

## 2023-04-23 NOTE — Therapy (Signed)
 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   Subdural hemorrhage (HCC)    Tubular adenoma of colon 11/03/2017   Colonoscopy Sept 9, 2019   Past Surgical History:  Procedure Laterality Date   CARDIAC CATHETERIZATION     CORONARY STENT INTERVENTION N/A 01/30/2021   Procedure: CORONARY STENT INTERVENTION;  Surgeon: Lyn Records, MD;  Location: [facility];  Service: Cardiovascular;  Laterality: N/A;   Examination under anesthesia, repair of anal fissure,  12/04/2000   LEFT HEART CATH AND CORONARY ANGIOGRAPHY N/A 01/30/2021   Procedure: LEFT HEART CATH AND CORONARY ANGIOGRAPHY;  Surgeon: Lyn Records, MD;  Location: [facility];  Service: Cardiovascular;  Laterality: N/A;   PERIPHERAL VASCULAR CATHETERIZATION Left 02/06/2016   Procedure: Lower Extremity Angiography;  Surgeon: Renford Dills, MD;  Location: [facility];  Service: Cardiovascular;   Laterality: Left;   PERIPHERAL VASCULAR CATHETERIZATION N/A 02/06/2016   Procedure: Abdominal Aortogram w/Lower Extremity;  Surgeon: Renford Dills, MD;  Location: [facility];  Service: Cardiovascular;  Laterality: N/A;   PERIPHERAL VASCULAR CATHETERIZATION  02/06/2016   Procedure: Lower Extremity Intervention;  Surgeon: Renford Dills, MD;  Location: [facility];  Service: Cardiovascular;;   Patient Active Problem List   Diagnosis Date Noted   Cervicalgia 03/21/2023   OSA on CPAP    Snoring 10/15/2022   PAD (peripheral artery disease) (HCC) 10/15/2022   Overweight 09/12/2022   Needs flu shot 09/12/2022   Type 2 myocardial infarction (HCC) 04/14/2022   Nausea, vomiting, and diarrhea 03/29/2022   Preventative health care 03/01/2022   Black stools 01/08/2022   Chronic neck pain 07/12/2021   Chronic pain of both shoulders 07/12/2021   Sensation of fullness in both ears 04/05/2021   History of cocaine use 03/02/2021   CAD in native artery 01/31/2021   S/P angioplasty with stent 01/30/21 to LAD  01/31/2021   Unstable angina (HCC) 01/30/2021   Chest pain    B12 deficiency 09/13/2020   Family history of malignant neoplasm of digestive organs 08/08/2020   Rectal bleeding 08/08/2020   Body mass index (BMI) 27.0-27.9, adult 04/07/2020   Laceration of scalp without complication 03/23/2020   Tubular  adenoma of colon 11/03/2017   Type 2 diabetes mellitus (HCC) 06/18/2016   Hepatitis B immune 03/11/2016   Atherosclerosis with claudication of extremity (HCC) 01/29/2016   Arthralgia 10/26/2014   Gastroesophageal reflux disease 06/23/2013   Tobacco use disorder 04/27/2013   Erectile dysfunction 10/27/2012   Insomnia 04/15/2012   Generalized anxiety disorder 02/26/2011    Class: Acute   Suicide attempt (HCC) 02/20/2011   HIV (human immunodeficiency virus infection) (HCC) 11/01/2010   Essential (primary) hypertension 11/01/2010   Hyperlipidemia 11/01/2010    PCP:  Elenore Paddy, NP  REFERRING PROVIDER: Rodolph Bong, MD   REFERRING DIAG:  M54.2 (ICD-10-CM) - Cervicalgia M25.511,G89.29,M25.512 (ICD-10-CM) - Chronic pain of both shoulders M54.50,G89.29 (ICD-10-CM) - Chronic bilateral low back pain without sciatica  Rationale for Evaluation and Treatment: Rehabilitation  THERAPY DIAG:  Cervicalgia  Chronic pain of both shoulders  Muscle weakness (generalized)  Other low back pain  Other abnormalities of gait and mobility  ONSET DATE: Chronic  SUBJECTIVE:                                                                                                                                                                                           SUBJECTIVE STATEMENT: Pt presents to PT with reports of chronic neck/UE (L>R) pain as well as N/T. Also c/o LBP especially with prolonged standing and walking. Denies bowel/bladder changes or saddle anesthesia. Is R hand dominant, has occasional trigger finger like symptoms but is not sure if this is related. Reaching across body and backwards greatly increases shoulder pain. Is frustrated that he can only walk his dog for 1/4 of a mile before he is limited by his lower back.   PERTINENT HISTORY:  HTN, subdural hemorrhage   PAIN:  Are you having pain?  Yes: NPRS scale: 2/10 Worst: 10/10 Pain location: neck and UE, L>R Pain description: pain, sore, N/T Aggravating factors: driving, across body reaching, certain cervical movements Relieving factors: medication  Yes: NPRS scale: 2/10 Worst: 10/10 Pain location: lower back Pain description: sore, tight Aggravating factors: prolonged walking Relieving factors: medication  PRECAUTIONS: None  RED FLAGS: None   WEIGHT BEARING RESTRICTIONS: No  FALLS:  Has patient fallen in last 6 months? No  LIVING ENVIRONMENT: Lives with: lives with their family Lives in: House/apartment  OCCUPATION: Retired  PLOF: Independent  PATIENT GOALS: pt wants to  decrease pain in neck/UE and lower back in order to improve comfort with yard work and walk his dog greater than 1/4 mile  NEXT MD VISIT: 06/05/2023  OBJECTIVE:  Note: Objective measures were completed at Evaluation unless otherwise noted.  DIAGNOSTIC FINDINGS:  See imaging   PATIENT SURVEYS:  Modified Oswestry: 18/50 - 36% disability NDI: 14/50 - 28% disability  COGNITION: Overall cognitive status: Within functional limits for tasks assessed     SENSATION: Light touch: Impaired - L UE  POSTURE: rounded shoulders, forward head, and increased lumbar lordosis  PALPATION: TTP to bilateral upper trap  CERVICAL ROM:   AROM eval  Flexion   Extension   Right lateral flexion   Left lateral flexion   Right rotation 45  Left rotation 38   (Blank rows = not tested)  UPPER EXTREMITY ROM:  AROM Right Eval Left Eval  Shoulder flexion 170 125  Shoulder extension    Shoulder abduction    Shoulder internal rotation    Shoulder external rotation    Elbow flexion    Elbow extension    Wrist flexion    Wrist extension    (Blank rows = not tested)  UPPER EXTREMITY MMT:  MMT Right Eval Left Eval  Shoulder flexion    Shoulder abduction    Shoulder IR 4/5 5/5  Shoulder ER 4/5 5/5  Shoulder extension    Upper trap    Middle trap    Lower trap    Elbow flexion     Elbow extension    Wrist flexion    Wrist extension    Grip 70lbs 60lbs   (Blank rows = not tested)  LUMBAR SPECIAL TESTS:  Straight leg raise test: Negative and Slump test: Negative  FUNCTIONAL TESTS:  30 Sec Sit to Stand: 10 reps no UE DNF Endurance: 8 seconds  GAIT: Distance walked: 64ft Assistive device utilized: None Level of assistance: Complete Independence Comments: trunk flexed  TREATMENT: [facility] Adult PT Treatment:                                                DATE: 04/23/2023 Therapeutic Exercise: Upper trap x 30" each Row x 10 blue band Supine chin tuck x 5 - 5" hold Seated bilateral  ER x 10 RTB  PATIENT EDUCATION:  Education details: eval findings, NDI, ODI, HEP, POC  Person educated: Patient Education method: Explanation, Demonstration, and Handouts Education comprehension: verbalized understanding and returned demonstration  HOME EXERCISE PROGRAM: Access Code: [email or web address] URL: [email or web address] Date: 04/23/2023 Prepared by: Edwinna Areola  Exercises - Seated Upper Trapezius Stretch  - 1 x daily - 7 x weekly - 3 sets - 30 sec hold - Standing Shoulder Row with Anchored Resistance  - 1 x daily - 7 x weekly - 3 sets - 10 reps - blue band hold - Supine Chin Tuck  - 1 x daily - 7 x weekly - 2 sets - 10 reps - Shoulder External Rotation and Scapular Retraction with Resistance  - 1 x daily - 7 x weekly - 3 sets - 10 reps - red band hold  ASSESSMENT:  CLINICAL IMPRESSION: Patient is a 63 y.o. M who was seen today for physical therapy evaluation and treatment for chronic neck and LBP. Physical findings are consistent with physician impression as pt demonstrates decrease in cervical ROM as well as postural and hip/core strength deficits. ODI and NDI scores suggest minimal/moderate disability in performance of home ADLs and community level activity, indicating he is operating well below PLOF. Pt would benefit from skilled PT services working on improving strength of postural muscles and multimodal approach of manual  and exercise to decrease pain and improve function.    OBJECTIVE IMPAIRMENTS: decreased activity tolerance, decreased endurance, decreased mobility, difficulty walking, decreased ROM, decreased strength, and pain  ACTIVITY LIMITATIONS: carrying, lifting, sitting, standing, squatting, stairs, reach over head, and locomotion level  PARTICIPATION LIMITATIONS: driving, shopping, community activity, occupation, and yard work  PERSONAL FACTORS: Time since onset of injury/illness/exacerbation and 1-2 comorbidities: HTN, subdural hemorrhage  are also  affecting patient's functional outcome.   REHAB POTENTIAL: Good  CLINICAL DECISION MAKING: Evolving/moderate complexity  EVALUATION COMPLEXITY: Moderate   GOALS: Goals reviewed with patient? No  SHORT TERM GOALS: Target date: 05/14/2023   Pt will be compliant and knowledgeable with initial HEP for improved comfort and carryover Baseline: initial HEP given  Goal status: INITIAL  2.  Pt will self report neck/UE and lower back pain no greater than 7/10 for improved comfort and functional ability Baseline: 10/10 at worst Goal status: INITIAL   LONG TERM GOALS: Target date: 06/18/2023   Pt will be decrease ODI disability score to no greater than 20% disability as proxy for functional improvement with home ADLs and community activites Baseline: 18/50 - 36% disability Goal status: INITIAL  2.  Pt will decrease NDI disability score to no greater than 20% disability as proxy for functional improvement with ADLs and community activities Baseline: 28% disability Goal status: INITIAL   3.  Pt will increase 30 Second Sit to Stand rep count to no less than 12 reps for improved balance, strength, and functional mobility Baseline: 10 reps  Goal status: INITIAL   4.  Pt will improve walking distance to at least 1 mile while walking his dog with decreased LBP for improved comfort with desired recreational activity Baseline: 1/4 mile Goal status: INITIAL  5.  Pt will improve bilateral cervical rotation to no less than 60 degrees for improved comfort and functional ability with driving Baseline: see ROM chart Goal status: INITIAL  6.  Pt will improve L grip strength to at least 70lbs for improved functional ability with grasp and ADL tasks Baseline: see ROM chart Goal status: INITIAL  PLAN:  PT FREQUENCY: 2x/week  PT DURATION: 8 weeks  PLANNED INTERVENTIONS: 97164- PT Re-evaluation, 97110-Therapeutic exercises, 97530- Therapeutic activity, [id number]- Neuromuscular re-education, 97535- Self  Care, [id number]- Manual therapy, [id number]- Gait training, [id number]- Aquatic Therapy, [id number]- Electrical stimulation (unattended), [id number]- Electrical stimulation (manual), 97016- Vasopneumatic device, Dry Needling, Cryotherapy, and Moist heat.  PLAN FOR NEXT SESSION: assess HEP response, periscapular/DNF, hip/core strength,    Eloy End, PT 04/23/2023, 12:19 PM

## 2023-04-28 ENCOUNTER — Ambulatory Visit: Admitting: Physical Therapy

## 2023-04-28 ENCOUNTER — Other Ambulatory Visit: Payer: Self-pay | Admitting: Cardiology

## 2023-04-28 DIAGNOSIS — M542 Cervicalgia: Secondary | ICD-10-CM | POA: Diagnosis not present

## 2023-04-28 DIAGNOSIS — R2689 Other abnormalities of gait and mobility: Secondary | ICD-10-CM

## 2023-04-28 DIAGNOSIS — G8929 Other chronic pain: Secondary | ICD-10-CM

## 2023-04-28 DIAGNOSIS — M5459 Other low back pain: Secondary | ICD-10-CM

## 2023-04-28 DIAGNOSIS — M6281 Muscle weakness (generalized): Secondary | ICD-10-CM

## 2023-04-28 NOTE — Therapy (Signed)
 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   Subdural hemorrhage (HCC)    Tubular adenoma of colon 11/03/2017   Colonoscopy Sept 9, 2019   Past Surgical History:  Procedure Laterality Date   CARDIAC CATHETERIZATION     CORONARY STENT INTERVENTION N/A 01/30/2021   Procedure: CORONARY STENT INTERVENTION;  Surgeon: Arty Binning, MD;  Location: [facility];  Service: Cardiovascular;  Laterality: N/A;   Examination under anesthesia, repair of anal fissure,  12/04/2000   LEFT HEART CATH AND CORONARY ANGIOGRAPHY N/A 01/30/2021   Procedure: LEFT HEART CATH AND CORONARY ANGIOGRAPHY;  Surgeon: Arty Binning, MD;  Location: [facility];  Service: Cardiovascular;  Laterality: N/A;   PERIPHERAL VASCULAR CATHETERIZATION Left 02/06/2016   Procedure: Lower Extremity Angiography;  Surgeon: Jackquelyn Mass, MD;  Location: [facility];  Service: Cardiovascular;  Laterality: Left;    PERIPHERAL VASCULAR CATHETERIZATION N/A 02/06/2016   Procedure: Abdominal Aortogram w/Lower Extremity;  Surgeon: Jackquelyn Mass, MD;  Location: [facility];  Service: Cardiovascular;  Laterality: N/A;   PERIPHERAL VASCULAR CATHETERIZATION  02/06/2016   Procedure: Lower Extremity Intervention;  Surgeon: Jackquelyn Mass, MD;  Location: [facility];  Service: Cardiovascular;;   Patient Active Problem List   Diagnosis Date Noted   Cervicalgia 03/21/2023   OSA on CPAP    Snoring 10/15/2022   PAD (peripheral artery disease) (HCC) 10/15/2022   Overweight 09/12/2022   Needs flu shot 09/12/2022   Type 2 myocardial infarction (HCC) 04/14/2022   Nausea, vomiting, and diarrhea 03/29/2022   Preventative health care 03/01/2022   Black stools 01/08/2022   Chronic neck pain 07/12/2021   Chronic pain of both shoulders 07/12/2021   Sensation of fullness in both ears 04/05/2021   History of cocaine use 03/02/2021   CAD in native artery 01/31/2021   S/P angioplasty with stent 01/30/21 to LAD  01/31/2021   Unstable angina (HCC) 01/30/2021   Chest pain    B12 deficiency 09/13/2020   Family history of malignant neoplasm of digestive organs 08/08/2020   Rectal bleeding 08/08/2020   Body mass index (BMI) 27.0-27.9, adult 04/07/2020   Laceration of scalp without complication 03/23/2020   Tubular  adenoma of colon 11/03/2017   Type 2 diabetes mellitus (HCC) 06/18/2016   Hepatitis B immune 03/11/2016   Atherosclerosis with claudication of extremity (HCC) 01/29/2016   Arthralgia 10/26/2014   Gastroesophageal reflux disease 06/23/2013   Tobacco use disorder 04/27/2013   Erectile dysfunction 10/27/2012   Insomnia 04/15/2012   Generalized anxiety disorder 02/26/2011    Class: Acute   Suicide attempt (HCC) 02/20/2011   HIV (human immunodeficiency virus infection) (HCC) 11/01/2010   Essential (primary) hypertension 11/01/2010   Hyperlipidemia 11/01/2010    PCP: Elenore Paddy,  NP  REFERRING PROVIDER: Rodolph Bong, MD   REFERRING DIAG:  M54.2 (ICD-10-CM) - Cervicalgia M25.511,G89.29,M25.512 (ICD-10-CM) - Chronic pain of both shoulders M54.50,G89.29 (ICD-10-CM) - Chronic bilateral low back pain without sciatica  Rationale for Evaluation and Treatment: Rehabilitation  THERAPY DIAG:  No diagnosis found.  ONSET DATE: Chronic  SUBJECTIVE:                                                                                                                                                                                           SUBJECTIVE STATEMENT: 04/28/2023 "I am doing better in the neck, I haven't tested the back out."  PERTINENT HISTORY:  HTN, subdural hemorrhage   PAIN:  Are you having pain?  Yes: NPRS scale: 1/10 Worst: 10/10 Pain location: neck and UE, L>R Pain description: pain, sore, N/T Aggravating factors: driving, across body reaching, certain cervical movements Relieving factors: medication  Yes: NPRS scale: 1/10 Worst: 10/10 Pain location: lower back Pain description: sore, tight Aggravating factors: prolonged walking Relieving factors: medication  PRECAUTIONS: None  RED FLAGS: None   WEIGHT BEARING RESTRICTIONS: No  FALLS:  Has patient fallen in last 6 months? No  LIVING ENVIRONMENT: Lives with: lives with their family Lives in: House/apartment  OCCUPATION: Retired  PLOF: Independent  PATIENT GOALS: pt wants to decrease pain in neck/UE and lower back in order to improve comfort with yard work and walk his dog greater than 1/4 mile  NEXT MD VISIT: 06/05/2023  OBJECTIVE:  Note: Objective measures were completed at Evaluation unless otherwise noted.  DIAGNOSTIC FINDINGS:  See imaging   PATIENT SURVEYS:  Modified Oswestry: 18/50 - 36% disability NDI: 14/50 - 28% disability  COGNITION: Overall cognitive status: Within functional limits for tasks assessed     SENSATION: Light touch: Impaired - L UE  POSTURE: rounded  shoulders, forward head, and increased lumbar lordosis  PALPATION: TTP to bilateral upper trap  CERVICAL ROM:   AROM eval  Flexion   Extension   Right lateral flexion   Left lateral flexion   Right rotation 45  Left  rotation 38   (Blank rows = not tested)  UPPER EXTREMITY ROM:  AROM Right Eval Left Eval  Shoulder flexion 170 125  Shoulder extension    Shoulder abduction    Shoulder internal rotation    Shoulder external rotation    Elbow flexion    Elbow extension    Wrist flexion    Wrist extension    (Blank rows = not tested)  UPPER EXTREMITY MMT:  MMT Right Eval Left Eval  Shoulder flexion    Shoulder abduction    Shoulder IR 4/5 5/5  Shoulder ER 4/5 5/5  Shoulder extension    Upper trap    Middle trap    Lower trap    Elbow flexion     Elbow extension    Wrist flexion    Wrist extension    Grip 70lbs 60lbs   (Blank rows = not tested)  LUMBAR SPECIAL TESTS:  Straight leg raise test: Negative and Slump test: Negative  FUNCTIONAL TESTS:  30 Sec Sit to Stand: 10 reps no UE DNF Endurance: 8 seconds  GAIT: Distance walked: 71ft Assistive device utilized: None Level of assistance: Complete Independence Comments: trunk flexed  TREATMENT: [facility] Adult PT Treatment:                                                DATE: 04/28/2023 Reviewed HEP  Upper trap stretch 2 x 30 sec reaching toward  PPT with tactile feedback Supine PPT with marching 2 x 20 LTR 1 x 20  Head tuck chin lift 5 x holding 10 seconds Seated anterior pelvic tilt 1 x 10 holding 5 - 10 sec Reviewed posture and spinal position Updated HEP today  [facility] Adult PT Treatment:                                                DATE: 04/23/2023 Therapeutic Exercise: Upper trap x 30" each Row x 10 blue band Supine chin tuck x 5 - 5" hold Seated bilateral ER x 10 RTB  PATIENT EDUCATION:  Education details: eval findings, NDI, ODI, HEP, POC  Person educated: Patient Education method:  Explanation, Demonstration, and Handouts Education comprehension: verbalized understanding and returned demonstration  HOME EXERCISE PROGRAM: Access Code: [email or web address] URL: [email or web address] Date: 04/23/2023 Prepared by: Edwinna Areola  Exercises - Seated Upper Trapezius Stretch  - 1 x daily - 7 x weekly - 3 sets - 30 sec hold - Standing Shoulder Row with Anchored Resistance  - 1 x daily - 7 x weekly - 3 sets - 10 reps - blue band hold - Supine Chin Tuck  - 1 x daily - 7 x weekly - 2 sets - 10 reps - Shoulder External Rotation and Scapular Retraction with Resistance  - 1 x daily - 7 x weekly - 3 sets - 10 reps - red band hold  ASSESSMENT:  CLINICAL IMPRESSION: 4/14/2025Mr Balash arrived to PT reported he is doing better with the exercises prescribed on his evaluation. He did not some discomfort with upper trap stretch which was modified today. Continued progressing DNF activation. Worked primarily on the low back/ core activation which he did well with noting improvement during session, updated HEP today.  Evaluation: Patient is a  63 y.o. M who was seen today for physical therapy evaluation and treatment for chronic neck and LBP. Physical findings are consistent with physician impression as pt demonstrates decrease in cervical ROM as well as postural and hip/core strength deficits. ODI and NDI scores suggest minimal/moderate disability in performance of home ADLs and community level activity, indicating he is operating well below PLOF. Pt would benefit from skilled PT services working on improving strength of postural muscles and multimodal approach of manual and exercise to decrease pain and improve function.    OBJECTIVE IMPAIRMENTS: decreased activity tolerance, decreased endurance, decreased mobility, difficulty walking, decreased ROM, decreased strength, and pain  ACTIVITY LIMITATIONS: carrying, lifting, sitting, standing, squatting, stairs, reach over head, and locomotion  level  PARTICIPATION LIMITATIONS: driving, shopping, community activity, occupation, and yard work  PERSONAL FACTORS: Time since onset of injury/illness/exacerbation and 1-2 comorbidities: HTN, subdural hemorrhage  are also affecting patient's functional outcome.   REHAB POTENTIAL: Good  CLINICAL DECISION MAKING: Evolving/moderate complexity  EVALUATION COMPLEXITY: Moderate   GOALS: Goals reviewed with patient? No  SHORT TERM GOALS: Target date: 05/14/2023   Pt will be compliant and knowledgeable with initial HEP for improved comfort and carryover Baseline: initial HEP given  Goal status: INITIAL  2.  Pt will self report neck/UE and lower back pain no greater than 7/10 for improved comfort and functional ability Baseline: 10/10 at worst Goal status: INITIAL   LONG TERM GOALS: Target date: 06/18/2023   Pt will be decrease ODI disability score to no greater than 20% disability as proxy for functional improvement with home ADLs and community activites Baseline: 18/50 - 36% disability Goal status: INITIAL  2.  Pt will decrease NDI disability score to no greater than 20% disability as proxy for functional improvement with ADLs and community activities Baseline: 28% disability Goal status: INITIAL   3.  Pt will increase 30 Second Sit to Stand rep count to no less than 12 reps for improved balance, strength, and functional mobility Baseline: 10 reps  Goal status: INITIAL   4.  Pt will improve walking distance to at least 1 mile while walking his dog with decreased LBP for improved comfort with desired recreational activity Baseline: 1/4 mile Goal status: INITIAL  5.  Pt will improve bilateral cervical rotation to no less than 60 degrees for improved comfort and functional ability with driving Baseline: see ROM chart Goal status: INITIAL  6.  Pt will improve L grip strength to at least 70lbs for improved functional ability with grasp and ADL tasks Baseline: see ROM chart Goal  status: INITIAL  PLAN:  PT FREQUENCY: 2x/week  PT DURATION: 8 weeks  PLANNED INTERVENTIONS: 97164- PT Re-evaluation, 97110-Therapeutic exercises, 97530- Therapeutic activity, [id number]- Neuromuscular re-education, 97535- Self Care, [id number]- Manual therapy, [id number]- Gait training, [id number]- Aquatic Therapy, W1191- Electrical stimulation (unattended), [id number]- Electrical stimulation (manual), 97016- Vasopneumatic device, Dry Needling, Cryotherapy, and Moist heat.  PLAN FOR NEXT SESSION: assess HEP response, periscapular/DNF, hip/core strength,   Kennette Cuthrell PT, DPT, LAT, ATC  04/28/23  11:05 AM

## 2023-04-29 ENCOUNTER — Telehealth: Payer: Self-pay | Admitting: Cardiology

## 2023-04-29 MED ORDER — CARVEDILOL 6.25 MG PO TABS: 6.2500 mg | ORAL_TABLET | Freq: Two times a day (BID) | ORAL | 3 refills | Status: DC

## 2023-04-29 NOTE — Telephone Encounter (Signed)
*  STAT* If patient is at the pharmacy, call can be transferred to refill team.   1. Which medications need to be refilled? (please list name of each medication and dose if known)    carvedilol (COREG) 6.25 MG tablet     4. Which pharmacy/location (including street and city if local pharmacy) is medication to be sent to?  [id number] - [place], [place] - [address]     5. Do they need a 30 day or 90 day supply? 90  Pt states he is completely out

## 2023-04-29 NOTE — Telephone Encounter (Signed)
 Pt's medication was sent to pt's pharmacy as requested. Confirmation received.

## 2023-04-30 ENCOUNTER — Encounter: Payer: Self-pay | Admitting: Physical Therapy

## 2023-04-30 ENCOUNTER — Ambulatory Visit: Admitting: Physical Therapy

## 2023-04-30 DIAGNOSIS — M5459 Other low back pain: Secondary | ICD-10-CM

## 2023-04-30 DIAGNOSIS — M542 Cervicalgia: Secondary | ICD-10-CM

## 2023-04-30 DIAGNOSIS — G8929 Other chronic pain: Secondary | ICD-10-CM

## 2023-04-30 DIAGNOSIS — M6281 Muscle weakness (generalized): Secondary | ICD-10-CM

## 2023-04-30 DIAGNOSIS — R2689 Other abnormalities of gait and mobility: Secondary | ICD-10-CM

## 2023-04-30 NOTE — Therapy (Signed)
 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   Subdural hemorrhage (HCC)    Tubular adenoma of colon 11/03/2017   Colonoscopy Sept 9, 2019   Past Surgical History:  Procedure Laterality Date   CARDIAC CATHETERIZATION     CORONARY STENT INTERVENTION N/A 01/30/2021   Procedure: CORONARY STENT INTERVENTION;  Surgeon: Arty Binning, MD;  Location: [facility];  Service: Cardiovascular;  Laterality: N/A;   Examination under anesthesia, repair of anal fissure,  12/04/2000   LEFT HEART CATH AND CORONARY ANGIOGRAPHY N/A 01/30/2021   Procedure: LEFT HEART CATH AND CORONARY ANGIOGRAPHY;  Surgeon: Arty Binning, MD;  Location: [facility];  Service: Cardiovascular;  Laterality: N/A;   PERIPHERAL VASCULAR CATHETERIZATION Left 02/06/2016   Procedure: Lower Extremity Angiography;  Surgeon: Jackquelyn Mass, MD;  Location: [facility];  Service: Cardiovascular;  Laterality: Left;    PERIPHERAL VASCULAR CATHETERIZATION N/A 02/06/2016   Procedure: Abdominal Aortogram w/Lower Extremity;  Surgeon: Jackquelyn Mass, MD;  Location: [facility];  Service: Cardiovascular;  Laterality: N/A;   PERIPHERAL VASCULAR CATHETERIZATION  02/06/2016   Procedure: Lower Extremity Intervention;  Surgeon: Jackquelyn Mass, MD;  Location: [facility];  Service: Cardiovascular;;   Patient Active Problem List   Diagnosis Date Noted   Cervicalgia 03/21/2023   OSA on CPAP    Snoring 10/15/2022   PAD (peripheral artery disease) (HCC) 10/15/2022   Overweight 09/12/2022   Needs flu shot 09/12/2022   Type 2 myocardial infarction (HCC) 04/14/2022   Nausea, vomiting, and diarrhea 03/29/2022   Preventative health care 03/01/2022   Black stools 01/08/2022   Chronic neck pain 07/12/2021   Chronic pain of both shoulders 07/12/2021   Sensation of fullness in both ears 04/05/2021   History of cocaine use 03/02/2021   CAD in native artery 01/31/2021   S/P angioplasty with stent 01/30/21 to LAD  01/31/2021   Unstable angina (HCC) 01/30/2021   Chest pain    B12 deficiency 09/13/2020   Family history of malignant neoplasm of digestive organs 08/08/2020   Rectal bleeding 08/08/2020   Body mass index (BMI) 27.0-27.9, adult 04/07/2020   Laceration of scalp without complication 03/23/2020   Tubular  adenoma of colon 11/03/2017   Type 2 diabetes mellitus (HCC) 06/18/2016   Hepatitis B immune 03/11/2016   Atherosclerosis with claudication of extremity (HCC) 01/29/2016   Arthralgia 10/26/2014   Gastroesophageal reflux disease 06/23/2013   Tobacco use disorder 04/27/2013   Erectile dysfunction 10/27/2012   Insomnia 04/15/2012   Generalized anxiety disorder 02/26/2011    Class: Acute   Suicide attempt (HCC) 02/20/2011   HIV (human immunodeficiency virus infection) (HCC) 11/01/2010   Essential (primary) hypertension 11/01/2010   Hyperlipidemia 11/01/2010    PCP: Elenore Paddy,  NP  REFERRING PROVIDER: Rodolph Bong, MD   REFERRING DIAG:  M54.2 (ICD-10-CM) - Cervicalgia M25.511,G89.29,M25.512 (ICD-10-CM) - Chronic pain of both shoulders M54.50,G89.29 (ICD-10-CM) - Chronic bilateral low back pain without sciatica  Rationale for Evaluation and Treatment: Rehabilitation  THERAPY DIAG:  Cervicalgia  Chronic pain of both shoulders  Muscle weakness (generalized)  Other low back pain  Other abnormalities of gait and mobility  ONSET DATE: Chronic  SUBJECTIVE:                                                                                                                                                                                           SUBJECTIVE STATEMENT: 04/30/2023 "Its only been one day so its tough tell if its making a differen ce  PERTINENT HISTORY:  HTN, subdural hemorrhage   PAIN:  Are you having pain?  Yes: NPRS scale: 1/10 Worst: 10/10 Pain location: neck and UE, L>R Pain description: pain, sore, N/T Aggravating factors: driving, across body reaching, certain cervical movements Relieving factors: medication  Yes: NPRS scale: 1/10 Worst: 10/10 Pain location: lower back Pain description: sore, tight Aggravating factors: prolonged walking Relieving factors: medication  PRECAUTIONS: None  RED FLAGS: None   WEIGHT BEARING RESTRICTIONS: No  FALLS:  Has patient fallen in last 6 months? No  LIVING ENVIRONMENT: Lives with: lives with their family Lives in: House/apartment  OCCUPATION: Retired  PLOF: Independent  PATIENT GOALS: pt wants to decrease pain in neck/UE and lower back in order to improve comfort with yard work and walk his dog greater than 1/4 mile  NEXT MD VISIT: 06/05/2023  OBJECTIVE:  Note: Objective measures were completed at Evaluation unless otherwise noted.  DIAGNOSTIC FINDINGS:  See imaging   PATIENT SURVEYS:  Modified Oswestry: 18/50 - 36% disability NDI: 14/50 - 28%  disability  COGNITION: Overall cognitive status: Within functional limits for tasks assessed     SENSATION: Light touch: Impaired - L UE  POSTURE: rounded shoulders, forward head, and increased lumbar lordosis  PALPATION: TTP to bilateral upper trap  CERVICAL ROM:   AROM eval  Flexion   Extension   Right lateral flexion   Left lateral flexion   Right rotation 45  Left rotation 38   (Blank rows = not tested)  UPPER EXTREMITY ROM:  AROM Right Eval Left Eval  Shoulder flexion 170 125  Shoulder extension    Shoulder abduction    Shoulder internal rotation    Shoulder external rotation    Elbow flexion    Elbow extension    Wrist flexion    Wrist extension    (Blank rows = not tested)  UPPER EXTREMITY MMT:  MMT Right Eval Left Eval  Shoulder flexion    Shoulder abduction    Shoulder IR 4/5 5/5  Shoulder ER 4/5 5/5  Shoulder extension    Upper trap    Middle trap    Lower trap    Elbow flexion     Elbow extension    Wrist flexion    Wrist extension    Grip 70lbs 60lbs   (Blank rows = not tested)  LUMBAR SPECIAL TESTS:  Straight leg raise test: Negative and Slump test: Negative  FUNCTIONAL TESTS:  30 Sec Sit to Stand: 10 reps no UE DNF Endurance: 8 seconds  GAIT: Distance walked: 55ft Assistive device utilized: None Level of assistance: Complete Independence Comments: trunk flexed  TREATMENT: [facility] Adult PT Treatment:                                                DATE: 04/30/23 Sub-occipital release Tack and stretch of the sub-occipitals.  MTPR along the cervical paraspinals and upper traps Nu-step L5 x 5 min UE/LE Standing Rows 2 x 12 with GTB Standing shoulder extension 2 x 12 with GTB Palloff press 1 x 10 bil Dead bug 5 x 10 sec holds Updated HEP today.   [facility] Adult PT Treatment:                                                DATE: 04/28/2023 Reviewed HEP  Upper trap stretch 2 x 30 sec reaching toward  PPT with tactile  feedback Supine PPT with marching 2 x 20 LTR 1 x 20  Head tuck chin lift 5 x holding 10 seconds Seated anterior pelvic tilt 1 x 10 holding 5 - 10 sec Reviewed posture and spinal position Updated HEP today  [facility] Adult PT Treatment:                                                DATE: 04/23/2023 Therapeutic Exercise: Upper trap x 30" each Row x 10 blue band Supine chin tuck x 5 - 5" hold Seated bilateral ER x 10 RTB  PATIENT EDUCATION:  Education details: eval findings, NDI, ODI, HEP, POC  Person educated: Patient Education method: Explanation, Demonstration, and Handouts Education comprehension: verbalized understanding and returned demonstration  HOME EXERCISE PROGRAM: Access Code: [email or web address] URL: [email or web address] Date: 04/30/2023 Prepared by: Lulu Riding  Exercises - Seated Upper Trapezius Stretch  - 1 x daily - 7 x weekly - 2 sets - 2 reps - 30 seconds hold - Standing Shoulder Row  with Anchored Resistance  - 1 x daily - 7 x weekly - 3 sets - 10 reps - blue band hold - Supine Chin Tuck  - 1 x daily - 7 x weekly - 2 sets - 10 reps - Supine Deep Neck Flexor Training - Repetitions  - 1 x daily - 7 x weekly - 2 sets - 10 reps - 10-20 seconds hold - Shoulder External Rotation and Scapular Retraction with Resistance  - 1 x daily - 7 x weekly - 3 sets - 10 reps - red band hold - Supine Posterior Pelvic Tilt  - 1 x daily - 7 x weekly - 2 sets - 20 reps - holding 10 sec hold - Supine March with Posterior Pelvic Tilt  - 1 x daily - 7 x weekly - 2 sets - 10 reps - Seated Anterior Pelvic Tilt  - 1 x daily - 7 x weekly - 2 sets - 10 reps - 5 seconds hold - Lower Trunk Rotation  - 1 x daily - 7 x weekly - 2 sets - 10 reps - Isometric Dead Bug  - 1 x daily - 7 x weekly - 2 sets - 10 reps - holding 10 seconds hold - Standing Anti-Rotation Press with Anchored Resistance  - 1 x daily - 7 x weekly - 2 sets - 10 reps - 1 sec hold  ASSESSMENT:  CLINICAL  IMPRESSION: 04/30/2023 [name] presents to PT today noting he feels he is making progress with decreased pain in a short period of time. Time was take to reviewed MTPR techniques and benefits which he noted release of tension continued working on posterior chain activation and core strenghtening. He reported feeling good at the end of the session compared to when he came in.   Evaluation: Patient is a 63 y.o. M who was seen today for physical therapy evaluation and treatment for chronic neck and LBP. Physical findings are consistent with physician impression as pt demonstrates decrease in cervical ROM as well as postural and hip/core strength deficits. ODI and NDI scores suggest minimal/moderate disability in performance of home ADLs and community level activity, indicating he is operating well below PLOF. Pt would benefit from skilled PT services working on improving strength of postural muscles and multimodal approach of manual and exercise to decrease pain and improve function.    OBJECTIVE IMPAIRMENTS: decreased activity tolerance, decreased endurance, decreased mobility, difficulty walking, decreased ROM, decreased strength, and pain  ACTIVITY LIMITATIONS: carrying, lifting, sitting, standing, squatting, stairs, reach over head, and locomotion level  PARTICIPATION LIMITATIONS: driving, shopping, community activity, occupation, and yard work  PERSONAL FACTORS: Time since onset of injury/illness/exacerbation and 1-2 comorbidities: HTN, subdural hemorrhage  are also affecting patient's functional outcome.   REHAB POTENTIAL: Good  CLINICAL DECISION MAKING: Evolving/moderate complexity  EVALUATION COMPLEXITY: Moderate   GOALS: Goals reviewed with patient? No  SHORT TERM GOALS: Target date: 05/14/2023   Pt will be compliant and knowledgeable with initial HEP for improved comfort and carryover Baseline: initial HEP given  Goal status: INITIAL  2.  Pt will self report neck/UE and lower back  pain no greater than 7/10 for improved comfort and functional ability Baseline: 10/10 at worst Goal status: INITIAL   LONG TERM GOALS: Target date: 06/18/2023   Pt will be decrease ODI disability score to no greater than 20% disability as proxy for functional improvement with home ADLs and community activites Baseline: 18/50 - 36% disability Goal status: INITIAL  2.  Pt will decrease NDI  disability score to no greater than 20% disability as proxy for functional improvement with ADLs and community activities Baseline: 28% disability Goal status: INITIAL   3.  Pt will increase 30 Second Sit to Stand rep count to no less than 12 reps for improved balance, strength, and functional mobility Baseline: 10 reps  Goal status: INITIAL   4.  Pt will improve walking distance to at least 1 mile while walking his dog with decreased LBP for improved comfort with desired recreational activity Baseline: 1/4 mile Goal status: INITIAL  5.  Pt will improve bilateral cervical rotation to no less than 60 degrees for improved comfort and functional ability with driving Baseline: see ROM chart Goal status: INITIAL  6.  Pt will improve L grip strength to at least 70lbs for improved functional ability with grasp and ADL tasks Baseline: see ROM chart Goal status: INITIAL  PLAN:  PT FREQUENCY: 2x/week  PT DURATION: 8 weeks  PLANNED INTERVENTIONS: 97164- PT Re-evaluation, 97110-Therapeutic exercises, 97530- Therapeutic activity, [id number]- Neuromuscular re-education, 97535- Self Care, 56433- Manual therapy, [id number]- Gait training, [id number]- Aquatic Therapy, [id number]- Electrical stimulation (unattended), [id number]- Electrical stimulation (manual), 97016- Vasopneumatic device, Dry Needling, Cryotherapy, and Moist heat.  PLAN FOR NEXT SESSION: progressive HEP PRN , periscapular/DNF, hip/core strength.   Lecia Esperanza PT, DPT, LAT, ATC  04/30/23  11:04 AM

## 2023-05-05 ENCOUNTER — Other Ambulatory Visit: Payer: Self-pay

## 2023-05-05 ENCOUNTER — Other Ambulatory Visit: Payer: Self-pay | Admitting: Pharmacy Technician

## 2023-05-05 NOTE — Progress Notes (Signed)
 Specialty Pharmacy Refill Coordination Note  Jacob Ali is a 63 y.o. male contacted today regarding refills of specialty medication(s) Bictegravir-Emtricitab-Tenofov (Biktarvy )   Patient requested (Patient-Rptd) Delivery   Delivery date: (Patient-Rptd) 05/16/23   Verified address: (Patient-Rptd) [address] [place] [address]   Medication will be filled on 05/15/23.

## 2023-05-06 ENCOUNTER — Ambulatory Visit

## 2023-05-06 DIAGNOSIS — M542 Cervicalgia: Secondary | ICD-10-CM | POA: Diagnosis not present

## 2023-05-06 DIAGNOSIS — M6281 Muscle weakness (generalized): Secondary | ICD-10-CM

## 2023-05-06 DIAGNOSIS — M5459 Other low back pain: Secondary | ICD-10-CM

## 2023-05-06 DIAGNOSIS — G8929 Other chronic pain: Secondary | ICD-10-CM

## 2023-05-06 DIAGNOSIS — R2689 Other abnormalities of gait and mobility: Secondary | ICD-10-CM

## 2023-05-06 NOTE — Therapy (Signed)
 OUTPATIENT PHYSICAL THERAPY TREATMENT   Patient Name: Jacob Ali MRN: [id number] DOB:1960/05/13, 63 y.o., male Today's Date: 05/06/2023  END OF SESSION:  PT End of Session - 05/06/23 0907     Visit Number 4    Number of Visits 17    Date for PT Re-Evaluation 06/18/23    Authorization Type [employer] MCD    PT Start Time 0928    PT Stop Time 1008    PT Time Calculation (min) 40 min    Activity Tolerance Patient tolerated treatment well    Behavior During Therapy [facility] for tasks assessed/performed               Past Medical History:  Diagnosis Date   CAD in native artery 01/31/2021   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   Subdural hemorrhage (HCC)    Tubular adenoma of colon 11/03/2017   Colonoscopy Sept 9, 2019   Past Surgical History:  Procedure Laterality Date   CARDIAC CATHETERIZATION     CORONARY STENT INTERVENTION N/A 01/30/2021   Procedure: CORONARY STENT INTERVENTION;  Surgeon: Arty Binning, MD;  Location: [facility];  Service: Cardiovascular;  Laterality: N/A;   Examination under anesthesia, repair of anal fissure,  12/04/2000   LEFT HEART CATH AND CORONARY ANGIOGRAPHY N/A 01/30/2021   Procedure: LEFT HEART CATH AND CORONARY ANGIOGRAPHY;  Surgeon: Arty Binning, MD;  Location: [facility] IN[facility] CV [facility];  Service: Cardiovascular;  Laterality: N/A;   PERIPHERAL VASCULAR CATHETERIZATION Left 02/06/2016   Procedure: Lower Extremity Angiography;  Surgeon: Jackquelyn Mass, MD;  Location: [facility] INVASIVE CV LAB;  Service: Cardiovascular;  Laterality:  Left;   PERIPHERAL VASCULAR CATHETERIZATION N/A 02/06/2016   Procedure: Abdominal Aortogram w/Lower Extremity;  Surgeon: Jackquelyn Mass, MD;  Location: [facility] INVASIVE CV LAB;  Service: Cardiovascular;  Laterality: N/A;   PERIPHERAL VASCULAR CATHETERIZATION  02/06/2016   Procedure: Lower Extremity Intervention;  Surgeon: Jackquelyn Mass, MD;  Location: [facility] INVASIVE CV LAB;  Service: Cardiovascular;;   Patient Active Problem List   Diagnosis Date Noted   Cervicalgia 03/21/2023   OSA on CPAP    Snoring 10/15/2022   PAD (peripheral artery disease) (HCC) 10/15/2022   Overweight 09/12/2022   Needs flu shot 09/12/2022   Type 2 myocardial infarction (HCC) 04/14/2022   Nausea, vomiting, and diarrhea 03/29/2022   Preventative health care 03/01/2022   Black stools 01/08/2022   Chronic neck pain 07/12/2021   Chronic pain of both shoulders 07/12/2021   Sensation of fullness in both ears 04/05/2021   History of cocaine use 03/02/2021   CAD in native artery 01/31/2021   S/P angioplasty with stent 01/30/21 to LAD  01/31/2021   Unstable angina (HCC) 01/30/2021   Chest pain    B12 deficiency 09/13/2020   Family history of malignant neoplasm of digestive organs 08/08/2020   Rectal bleeding 08/08/2020   Body mass index (BMI) 27.0-27.9, adult 04/07/2020   Laceration of scalp without complication 03/23/2020  Tubular adenoma of colon 11/03/2017   Type 2 diabetes mellitus (HCC) 06/18/2016   Hepatitis B immune 03/11/2016   Atherosclerosis with claudication of extremity (HCC) 01/29/2016   Arthralgia 10/26/2014   Gastroesophageal reflux disease 06/23/2013   Tobacco use disorder 04/27/2013   Erectile dysfunction 10/27/2012   Insomnia 04/15/2012   Generalized anxiety disorder 02/26/2011    Class: Acute   Suicide attempt (HCC) 02/20/2011   HIV (human immunodeficiency virus infection) (HCC) 11/01/2010   Essential (primary) hypertension 11/01/2010   Hyperlipidemia 11/01/2010    PCP: Zorita Hiss, NP  REFERRING PROVIDER: Syliva Even, MD   REFERRING DIAG:  M54.2 (ICD-10-CM) - Cervicalgia M25.511,G89.29,M25.512 (ICD-10-CM) - Chronic pain of both shoulders M54.50,G89.29 (ICD-10-CM) - Chronic bilateral low back pain without sciatica  Rationale for Evaluation and Treatment: Rehabilitation  THERAPY DIAG:  Cervicalgia  Chronic pain of both shoulders  Muscle weakness (generalized)  Other low back pain  Other abnormalities of gait and mobility  ONSET DATE: Chronic  SUBJECTIVE:                                                                                                                                                                                           SUBJECTIVE STATEMENT: 05/06/2023 Pt presents to PT noting he is very tired and sore after being with his mom in the hospital last night. Has continued HEP compliance.   PERTINENT HISTORY:  HTN, subdural hemorrhage   PAIN:  Are you having pain?  Yes: NPRS scale: 5/10 Worst: 10/10 Pain location: neck and UE, L>R Pain description: pain, sore, N/T Aggravating factors: driving, across body reaching, certain cervical movements Relieving factors: medication  Yes: NPRS scale: 1/10 Worst: 10/10 Pain location: lower back Pain description: sore, tight Aggravating factors: prolonged walking Relieving factors: medication  PRECAUTIONS: None  RED FLAGS: None   WEIGHT BEARING RESTRICTIONS: No  FALLS:  Has patient fallen in last 6 months? No  LIVING ENVIRONMENT: Lives with: lives with their family Lives in: House/apartment  OCCUPATION: Retired  PLOF: Independent  PATIENT GOALS: pt wants to decrease pain in neck/UE and lower back in order to improve comfort with yard work and walk his dog greater than 1/4 mile  NEXT MD VISIT: 06/05/2023  OBJECTIVE:  Note: Objective measures were completed at Evaluation unless otherwise noted.  DIAGNOSTIC FINDINGS:  See imaging   PATIENT SURVEYS:  Modified Oswestry:  18/50 - 36% disability NDI: 14/50 - 28% disability  COGNITION: Overall cognitive status: Within functional limits for tasks assessed     SENSATION: Light touch: Impaired - L UE  POSTURE: rounded shoulders, forward head, and increased lumbar lordosis  PALPATION:  TTP to bilateral upper trap  CERVICAL ROM:   AROM eval  Flexion   Extension   Right lateral flexion   Left lateral flexion   Right rotation 45  Left rotation 38   (Blank rows = not tested)  UPPER EXTREMITY ROM:  AROM Right Eval Left Eval  Shoulder flexion 170 125  Shoulder extension    Shoulder abduction    Shoulder internal rotation    Shoulder external rotation    Elbow flexion    Elbow extension    Wrist flexion    Wrist extension    (Blank rows = not tested)  UPPER EXTREMITY MMT:  MMT Right Eval Left Eval  Shoulder flexion    Shoulder abduction    Shoulder IR 4/5 5/5  Shoulder ER 4/5 5/5  Shoulder extension    Upper trap    Middle trap    Lower trap    Elbow flexion     Elbow extension    Wrist flexion    Wrist extension    Grip 70lbs 60lbs   (Blank rows = not tested)  LUMBAR SPECIAL TESTS:  Straight leg raise test: Negative and Slump test: Negative  FUNCTIONAL TESTS:  30 Sec Sit to Stand: 10 reps no UE DNF Endurance: 8 seconds  GAIT: Distance walked: 51ft Assistive device utilized: None Level of assistance: Complete Independence Comments: trunk flexed  TREATMENT: [facility] Adult PT Treatment:                                                DATE: 05/06/23 Therapeutic Activity: Nu-step L5 x 5 min UE/LE for functional activity tolerance Supine chin tuck x 10 - 3" hold Supine horizontal abd 2x10 RTB Therapeutic Exercise:  Pilates bridge 2x10 Pilates SLR 2x10  FM row 2x10 20# Pallof press 2x10 7# Manual Therapy: Sub-occipital release Positional release bilateral upper trap Manual cervical traction Cervical side glides C5 grade II TPR to bilateral upper trap  [facility] Adult PT  Treatment:                                                DATE: 04/30/23 Sub-occipital release Tack and stretch of the sub-occipitals.  MTPR along the cervical paraspinals and upper traps Nu-step L5 x 5 min UE/LE Standing Rows 2 x 12 with GTB Standing shoulder extension 2 x 12 with GTB Palloff press 1 x 10 bil Dead bug 5 x 10 sec holds Updated HEP today  [facility] Adult PT Treatment:                                                DATE: 04/28/2023 Reviewed HEP  Upper trap stretch 2 x 30 sec reaching toward  PPT with tactile feedback Supine PPT with marching 2 x 20 LTR 1 x 20  Head tuck chin lift 5 x holding 10 seconds Seated anterior pelvic tilt 1 x 10 holding 5 - 10 sec Reviewed posture and spinal position Updated HEP today  [facility] Adult PT Treatment:  DATE: 04/23/2023 Therapeutic Exercise: Upper trap x 30" each Row x 10 blue band Supine chin tuck x 5 - 5" hold Seated bilateral ER x 10 RTB  PATIENT EDUCATION:  Education details: HEP update Person educated: Patient Education method: Explanation, Demonstration, and Handouts Education comprehension: verbalized understanding and returned demonstration  HOME EXERCISE PROGRAM: Access Code: [email or web address] URL: [email or web address] Date: 05/06/2023 Prepared by: Loral Roch  Exercises - Seated Upper Trapezius Stretch  - 1 x daily - 7 x weekly - 2 sets - 2 reps - 30 seconds hold - Standing Shoulder Row with Anchored Resistance  - 1 x daily - 7 x weekly - 3 sets - 10 reps - blue band hold - Supine Chin Tuck  - 1 x daily - 7 x weekly - 2 sets - 10 reps - Supine Deep Neck Flexor Training - Repetitions  - 1 x daily - 7 x weekly - 2 sets - 10 reps - 10-20 seconds hold - Shoulder External Rotation and Scapular Retraction with Resistance  - 1 x daily - 7 x weekly - 3 sets - 10 reps - red band hold - Supine Posterior Pelvic Tilt  - 1 x daily - 7 x weekly - 2 sets - 20 reps - holding 10 sec  hold - Supine March with Posterior Pelvic Tilt  - 1 x daily - 7 x weekly - 2 sets - 10 reps - Seated Anterior Pelvic Tilt  - 1 x daily - 7 x weekly - 2 sets - 10 reps - 5 seconds hold - Lower Trunk Rotation  - 1 x daily - 7 x weekly - 2 sets - 10 reps - Isometric Dead Bug  - 1 x daily - 7 x weekly - 2 sets - 10 reps - holding 10 seconds hold - Standing Anti-Rotation Press with Anchored Resistance  - 1 x daily - 7 x weekly - 2 sets - 10 reps - 1 sec hold - Supine Bridge with Resistance Band  - 1 x daily - 7 x weekly - 2 sets - 10 reps - blue band hold  ASSESSMENT:  CLINICAL IMPRESSION: 05/06/2023 Pt was able to complete all prescribed exercises with no adverse effect. Today therapy focused on manual therapy for decreasing pain and strengthening exercises focused on DNF/periscapular muscles as well as core/hip in order to decrease pain and improve function. HEP updated for continued core and proximal hip strengthening. Pt is progressing well with therapy, will continue to progress as able.   Evaluation: Patient is a 63 y.o. M who was seen today for physical therapy evaluation and treatment for chronic neck and LBP. Physical findings are consistent with physician impression as pt demonstrates decrease in cervical ROM as well as postural and hip/core strength deficits. ODI and NDI scores suggest minimal/moderate disability in performance of home ADLs and community level activity, indicating he is operating well below PLOF. Pt would benefit from skilled PT services working on improving strength of postural muscles and multimodal approach of manual and exercise to decrease pain and improve function.    OBJECTIVE IMPAIRMENTS: decreased activity tolerance, decreased endurance, decreased mobility, difficulty walking, decreased ROM, decreased strength, and pain  ACTIVITY LIMITATIONS: carrying, lifting, sitting, standing, squatting, stairs, reach over head, and locomotion level  PARTICIPATION LIMITATIONS:  driving, shopping, community activity, occupation, and yard work  PERSONAL FACTORS: Time since onset of injury/illness/exacerbation and 1-2 comorbidities: HTN, subdural hemorrhage  are also affecting patient's functional outcome.   REHAB POTENTIAL: Good  CLINICAL DECISION MAKING: Evolving/moderate  complexity  EVALUATION COMPLEXITY: Moderate   GOALS: Goals reviewed with patient? No  SHORT TERM GOALS: Target date: 05/14/2023   Pt will be compliant and knowledgeable with initial HEP for improved comfort and carryover Baseline: initial HEP given  Goal status: INITIAL  2.  Pt will self report neck/UE and lower back pain no greater than 7/10 for improved comfort and functional ability Baseline: 10/10 at worst Goal status: INITIAL   LONG TERM GOALS: Target date: 06/18/2023   Pt will be decrease ODI disability score to no greater than 20% disability as proxy for functional improvement with home ADLs and community activites Baseline: 18/50 - 36% disability Goal status: INITIAL  2.  Pt will decrease NDI disability score to no greater than 20% disability as proxy for functional improvement with ADLs and community activities Baseline: 28% disability Goal status: INITIAL   3.  Pt will increase 30 Second Sit to Stand rep count to no less than 12 reps for improved balance, strength, and functional mobility Baseline: 10 reps  Goal status: INITIAL   4.  Pt will improve walking distance to at least 1 mile while walking his dog with decreased LBP for improved comfort with desired recreational activity Baseline: 1/4 mile Goal status: INITIAL  5.  Pt will improve bilateral cervical rotation to no less than 60 degrees for improved comfort and functional ability with driving Baseline: see ROM chart Goal status: INITIAL  6.  Pt will improve L grip strength to at least 70lbs for improved functional ability with grasp and ADL tasks Baseline: see ROM chart Goal status: INITIAL  PLAN:  PT  FREQUENCY: 2x/week  PT DURATION: 8 weeks  PLANNED INTERVENTIONS: 97164- PT Re-evaluation, 97110-Therapeutic exercises, 97530- Therapeutic activity, [id number]- Neuromuscular re-education, 97535- Self Care, 16109- Manual therapy, [id number]- Gait training, [id number]- Aquatic Therapy, [id number]- Electrical stimulation (unattended), [id number]- Electrical stimulation (manual), 97016- Vasopneumatic device, Dry Needling, Cryotherapy, and Moist heat.  PLAN FOR NEXT SESSION: progressive HEP PRN , periscapular/DNF, hip/core strength.   Ivor Mars PT  05/06/23 10:13 AM

## 2023-05-08 ENCOUNTER — Ambulatory Visit

## 2023-05-08 DIAGNOSIS — M542 Cervicalgia: Secondary | ICD-10-CM | POA: Diagnosis not present

## 2023-05-08 DIAGNOSIS — G8929 Other chronic pain: Secondary | ICD-10-CM

## 2023-05-08 NOTE — Therapy (Signed)
 OUTPATIENT PHYSICAL THERAPY TREATMENT   Patient Name: Jacob Ali MRN: [id number] DOB:1960/06/15, 63 y.o., male Today's Date: 05/08/2023  END OF SESSION:  PT End of Session - 05/08/23 0931     Visit Number 5    Number of Visits 17    Date for PT Re-Evaluation 06/18/23    Authorization Type [employer] MCD    PT Start Time 0930    PT Stop Time 1010    PT Time Calculation (min) 40 min    Activity Tolerance Patient tolerated treatment well    Behavior During Therapy [facility] for tasks assessed/performed                Past Medical History:  Diagnosis Date   CAD in native artery 01/31/2021   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   Subdural hemorrhage (HCC)    Tubular adenoma of colon 11/03/2017   Colonoscopy Sept 9, 2019   Past Surgical History:  Procedure Laterality Date   CARDIAC CATHETERIZATION     CORONARY STENT INTERVENTION N/A 01/30/2021   Procedure: CORONARY STENT INTERVENTION;  Surgeon: Arty Binning, MD;  Location: [facility];  Service: Cardiovascular;  Laterality: N/A;   Examination under anesthesia, repair of anal fissure,  12/04/2000   LEFT HEART CATH AND CORONARY ANGIOGRAPHY N/A 01/30/2021   Procedure: LEFT HEART CATH AND CORONARY ANGIOGRAPHY;  Surgeon: Arty Binning, MD;  Location: [facility] INV[facility] CV [facility];  Service: Cardiovascular;  Laterality: N/A;   PERIPHERAL VASCULAR CATHETERIZATION Left 02/06/2016   Procedure: Lower Extremity Angiography;  Surgeon: Jackquelyn Mass, MD;  Location: [facility] INVASIVE CV LAB;  Service: Cardiovascular;  Laterality:  Left;   PERIPHERAL VASCULAR CATHETERIZATION N/A 02/06/2016   Procedure: Abdominal Aortogram w/Lower Extremity;  Surgeon: Jackquelyn Mass, MD;  Location: [facility] INVASIVE CV LAB;  Service: Cardiovascular;  Laterality: N/A;   PERIPHERAL VASCULAR CATHETERIZATION  02/06/2016   Procedure: Lower Extremity Intervention;  Surgeon: Jackquelyn Mass, MD;  Location: [facility] INVASIVE CV LAB;  Service: Cardiovascular;;   Patient Active Problem List   Diagnosis Date Noted   Cervicalgia 03/21/2023   OSA on CPAP    Snoring 10/15/2022   PAD (peripheral artery disease) (HCC) 10/15/2022   Overweight 09/12/2022   Needs flu shot 09/12/2022   Type 2 myocardial infarction (HCC) 04/14/2022   Nausea, vomiting, and diarrhea 03/29/2022   Preventative health care 03/01/2022   Black stools 01/08/2022   Chronic neck pain 07/12/2021   Chronic pain of both shoulders 07/12/2021   Sensation of fullness in both ears 04/05/2021   History of cocaine use 03/02/2021   CAD in native artery 01/31/2021   S/P angioplasty with stent 01/30/21 to LAD  01/31/2021   Unstable angina (HCC) 01/30/2021   Chest pain    B12 deficiency 09/13/2020   Family history of malignant neoplasm of digestive organs 08/08/2020   Rectal bleeding 08/08/2020   Body mass index (BMI) 27.0-27.9, adult 04/07/2020   Laceration of scalp without complication 03/23/2020  Tubular adenoma of colon 11/03/2017   Type 2 diabetes mellitus (HCC) 06/18/2016   Hepatitis B immune 03/11/2016   Atherosclerosis with claudication of extremity (HCC) 01/29/2016   Arthralgia 10/26/2014   Gastroesophageal reflux disease 06/23/2013   Tobacco use disorder 04/27/2013   Erectile dysfunction 10/27/2012   Insomnia 04/15/2012   Generalized anxiety disorder 02/26/2011    Class: Acute   Suicide attempt (HCC) 02/20/2011   HIV (human immunodeficiency virus infection) (HCC) 11/01/2010   Essential (primary) hypertension 11/01/2010   Hyperlipidemia 11/01/2010    PCP: Zorita Hiss, NP  REFERRING PROVIDER: Syliva Even, MD   REFERRING DIAG:  M54.2 (ICD-10-CM) - Cervicalgia M25.511,G89.29,M25.512 (ICD-10-CM) - Chronic pain of both shoulders M54.50,G89.29 (ICD-10-CM) - Chronic bilateral low back pain without sciatica  Rationale for Evaluation and Treatment: Rehabilitation  THERAPY DIAG:  Cervicalgia  Chronic pain of both shoulders  ONSET DATE: Chronic  SUBJECTIVE:                                                                                                                                                                                           SUBJECTIVE STATEMENT: 05/08/2023 Pt presents to PT with reports of muscle soreness after last session. Has continued HEP compliance.   PERTINENT HISTORY:  HTN, subdural hemorrhage   PAIN:  Are you having pain?  Yes: NPRS scale: 5/10 Worst: 10/10 Pain location: neck and UE, L>R Pain description: pain, sore, N/T Aggravating factors: driving, across body reaching, certain cervical movements Relieving factors: medication  Yes: NPRS scale: 1/10 Worst: 10/10 Pain location: lower back Pain description: sore, tight Aggravating factors: prolonged walking Relieving factors: medication  PRECAUTIONS: None  RED FLAGS: None   WEIGHT BEARING RESTRICTIONS: No  FALLS:  Has patient fallen in last 6 months? No  LIVING ENVIRONMENT: Lives with: lives with their family Lives in: House/apartment  OCCUPATION: Retired  PLOF: Independent  PATIENT GOALS: pt wants to decrease pain in neck/UE and lower back in order to improve comfort with yard work and walk his dog greater than 1/4 mile  NEXT MD VISIT: 06/05/2023  OBJECTIVE:  Note: Objective measures were completed at Evaluation unless otherwise noted.  DIAGNOSTIC FINDINGS:  See imaging   PATIENT SURVEYS:  Modified Oswestry: 18/50 - 36% disability NDI: 14/50 - 28% disability  COGNITION: Overall cognitive status: Within functional limits for tasks  assessed     SENSATION: Light touch: Impaired - L UE  POSTURE: rounded shoulders, forward head, and increased lumbar lordosis  PALPATION: TTP to bilateral upper trap  CERVICAL ROM:   AROM eval  Flexion   Extension   Right lateral flexion   Left  lateral flexion   Right rotation 45  Left rotation 38   (Blank rows = not tested)  UPPER EXTREMITY ROM:  AROM Right Eval Left Eval  Shoulder flexion 170 125  Shoulder extension    Shoulder abduction    Shoulder internal rotation    Shoulder external rotation    Elbow flexion    Elbow extension    Wrist flexion    Wrist extension    (Blank rows = not tested)  UPPER EXTREMITY MMT:  MMT Right Eval Left Eval  Shoulder flexion    Shoulder abduction    Shoulder IR 4/5 5/5  Shoulder ER 4/5 5/5  Shoulder extension    Upper trap    Middle trap    Lower trap    Elbow flexion     Elbow extension    Wrist flexion    Wrist extension    Grip 70lbs 60lbs   (Blank rows = not tested)  LUMBAR SPECIAL TESTS:  Straight leg raise test: Negative and Slump test: Negative  FUNCTIONAL TESTS:  30 Sec Sit to Stand: 10 reps no UE DNF Endurance: 8 seconds  GAIT: Distance walked: 68ft Assistive device utilized: None Level of assistance: Complete Independence Comments: trunk flexed  TREATMENT: [facility] Adult PT Treatment:                                                DATE: 05/08/23 Therapeutic Exercise: UBE lvl 1.0 x 4 min for functional activity tolerance Supine chin tuck x 10 - 3" hold Supine horizontal abd 2x15 RTB Pilates bridge 2x10 Pilates SLR 2x10  Supine PPT x 10 - 3" hold Supine PPT with ball 2x10 - 3" hold Seated bilateral ER 2x15 RTB FM row 2x10 20# Pallof press 2x10 7# each Standing chop 2x10 7# each  [facility] Adult PT Treatment:                                                DATE: 05/06/23 Therapeutic Activity: Nu-step L5 x 5 min UE/LE for functional activity tolerance Supine chin tuck x 10 - 3" hold Supine  horizontal abd 2x10 RTB Therapeutic Exercise:  Pilates bridge 2x10 Pilates SLR 2x10  FM row 2x10 20# Pallof press 2x10 7# Manual Therapy: Sub-occipital release Positional release bilateral upper trap Manual cervical traction Cervical side glides C5 grade II TPR to bilateral upper trap  [facility] Adult PT Treatment:                                                DATE: 04/30/23 Sub-occipital release Tack and stretch of the sub-occipitals.  MTPR along the cervical paraspinals and upper traps Nu-step L5 x 5 min UE/LE Standing Rows 2 x 12 with GTB Standing shoulder extension 2 x 12 with GTB Palloff press 1 x 10 bil Dead bug 5 x 10 sec holds Updated HEP today  [facility] Adult PT Treatment:  DATE: 04/28/2023 Reviewed HEP  Upper trap stretch 2 x 30 sec reaching toward  PPT with tactile feedback Supine PPT with marching 2 x 20 LTR 1 x 20  Head tuck chin lift 5 x holding 10 seconds Seated anterior pelvic tilt 1 x 10 holding 5 - 10 sec Reviewed posture and spinal position Updated HEP today  [facility] Adult PT Treatment:                                                DATE: 04/23/2023 Therapeutic Exercise: Upper trap x 30" each Row x 10 blue band Supine chin tuck x 5 - 5" hold Seated bilateral ER x 10 RTB  PATIENT EDUCATION:  Education details: HEP update Person educated: Patient Education method: Explanation, Demonstration, and Handouts Education comprehension: verbalized understanding and returned demonstration  HOME EXERCISE PROGRAM: Access Code: [email or web address] URL: [email or web address] Date: 05/06/2023 Prepared by: Loral Roch  Exercises - Seated Upper Trapezius Stretch  - 1 x daily - 7 x weekly - 2 sets - 2 reps - 30 seconds hold - Standing Shoulder Row with Anchored Resistance  - 1 x daily - 7 x weekly - 3 sets - 10 reps - blue band hold - Supine Chin Tuck  - 1 x daily - 7 x weekly - 2 sets - 10 reps - Supine Deep Neck Flexor  Training - Repetitions  - 1 x daily - 7 x weekly - 2 sets - 10 reps - 10-20 seconds hold - Shoulder External Rotation and Scapular Retraction with Resistance  - 1 x daily - 7 x weekly - 3 sets - 10 reps - red band hold - Supine Posterior Pelvic Tilt  - 1 x daily - 7 x weekly - 2 sets - 20 reps - holding 10 sec hold - Supine March with Posterior Pelvic Tilt  - 1 x daily - 7 x weekly - 2 sets - 10 reps - Seated Anterior Pelvic Tilt  - 1 x daily - 7 x weekly - 2 sets - 10 reps - 5 seconds hold - Lower Trunk Rotation  - 1 x daily - 7 x weekly - 2 sets - 10 reps - Isometric Dead Bug  - 1 x daily - 7 x weekly - 2 sets - 10 reps - holding 10 seconds hold - Standing Anti-Rotation Press with Anchored Resistance  - 1 x daily - 7 x weekly - 2 sets - 10 reps - 1 sec hold - Supine Bridge with Resistance Band  - 1 x daily - 7 x weekly - 2 sets - 10 reps - blue band hold  ASSESSMENT:  CLINICAL IMPRESSION: 05/08/2023 Pt was able to complete all prescribed exercises with no adverse effect. Today therapy focused on strengthening exercises for DNF/periscapular muscles as well as core/hip in order to decrease pain and improve function. Pt is progressing well with therapy, will continue to progress as able.   Evaluation: Patient is a 63 y.o. M who was seen today for physical therapy evaluation and treatment for chronic neck and LBP. Physical findings are consistent with physician impression as pt demonstrates decrease in cervical ROM as well as postural and hip/core strength deficits. ODI and NDI scores suggest minimal/moderate disability in performance of home ADLs and community level activity, indicating he is operating well below PLOF. Pt would benefit from skilled PT services working on  improving strength of postural muscles and multimodal approach of manual and exercise to decrease pain and improve function.    OBJECTIVE IMPAIRMENTS: decreased activity tolerance, decreased endurance, decreased mobility, difficulty  walking, decreased ROM, decreased strength, and pain  ACTIVITY LIMITATIONS: carrying, lifting, sitting, standing, squatting, stairs, reach over head, and locomotion level  PARTICIPATION LIMITATIONS: driving, shopping, community activity, occupation, and yard work  PERSONAL FACTORS: Time since onset of injury/illness/exacerbation and 1-2 comorbidities: HTN, subdural hemorrhage  are also affecting patient's functional outcome.   REHAB POTENTIAL: Good  CLINICAL DECISION MAKING: Evolving/moderate complexity  EVALUATION COMPLEXITY: Moderate   GOALS: Goals reviewed with patient? No  SHORT TERM GOALS: Target date: 05/14/2023   Pt will be compliant and knowledgeable with initial HEP for improved comfort and carryover Baseline: initial HEP given  Goal status: INITIAL  2.  Pt will self report neck/UE and lower back pain no greater than 7/10 for improved comfort and functional ability Baseline: 10/10 at worst Goal status: INITIAL   LONG TERM GOALS: Target date: 06/18/2023   Pt will be decrease ODI disability score to no greater than 20% disability as proxy for functional improvement with home ADLs and community activites Baseline: 18/50 - 36% disability Goal status: INITIAL  2.  Pt will decrease NDI disability score to no greater than 20% disability as proxy for functional improvement with ADLs and community activities Baseline: 28% disability Goal status: INITIAL   3.  Pt will increase 30 Second Sit to Stand rep count to no less than 12 reps for improved balance, strength, and functional mobility Baseline: 10 reps  Goal status: INITIAL   4.  Pt will improve walking distance to at least 1 mile while walking his dog with decreased LBP for improved comfort with desired recreational activity Baseline: 1/4 mile Goal status: INITIAL  5.  Pt will improve bilateral cervical rotation to no less than 60 degrees for improved comfort and functional ability with driving Baseline: see ROM  chart Goal status: INITIAL  6.  Pt will improve L grip strength to at least 70lbs for improved functional ability with grasp and ADL tasks Baseline: see ROM chart Goal status: INITIAL  PLAN:  PT FREQUENCY: 2x/week  PT DURATION: 8 weeks  PLANNED INTERVENTIONS: [id number]- PT Re-evaluation, 97110-Therapeutic exercises, 97530- Therapeutic activity, [id number]- Neuromuscular re-education, 97535- Self Care, [id number]- Manual therapy, [id number]- Gait training, [id number]- Aquatic Therapy, [id number]- Electrical stimulation (unattended), [id number]- Electrical stimulation (manual), 97016- Vasopneumatic device, Dry Needling, Cryotherapy, and Moist heat.  PLAN FOR NEXT SESSION: progressive HEP PRN , periscapular/DNF, hip/core strength.   Ivor Mars PT  05/08/23 10:15 AM

## 2023-05-13 ENCOUNTER — Ambulatory Visit

## 2023-05-13 DIAGNOSIS — R2689 Other abnormalities of gait and mobility: Secondary | ICD-10-CM

## 2023-05-13 DIAGNOSIS — M5459 Other low back pain: Secondary | ICD-10-CM

## 2023-05-13 DIAGNOSIS — G8929 Other chronic pain: Secondary | ICD-10-CM

## 2023-05-13 DIAGNOSIS — M542 Cervicalgia: Secondary | ICD-10-CM | POA: Diagnosis not present

## 2023-05-13 DIAGNOSIS — M6281 Muscle weakness (generalized): Secondary | ICD-10-CM

## 2023-05-13 NOTE — Therapy (Signed)
 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   Subdural hemorrhage (HCC)    Tubular adenoma of colon 11/03/2017   Colonoscopy Sept 9, 2019   Past Surgical History:  Procedure Laterality Date   CARDIAC CATHETERIZATION     CORONARY STENT INTERVENTION N/A 01/30/2021   Procedure: CORONARY STENT INTERVENTION;  Surgeon: Arty Binning, MD;  Location: [facility];  Service: Cardiovascular;  Laterality: N/A;   Examination under anesthesia, repair of anal fissure,  12/04/2000   LEFT HEART CATH AND CORONARY ANGIOGRAPHY N/A 01/30/2021   Procedure: LEFT HEART CATH AND CORONARY ANGIOGRAPHY;  Surgeon: Arty Binning, MD;  Location: [facility];  Service: Cardiovascular;  Laterality: N/A;   PERIPHERAL VASCULAR CATHETERIZATION Left 02/06/2016   Procedure: Lower Extremity Angiography;  Surgeon: Jackquelyn Mass, MD;  Location: [facility];  Service: Cardiovascular;  Laterality:  Left;   PERIPHERAL VASCULAR CATHETERIZATION N/A 02/06/2016   Procedure: Abdominal Aortogram w/Lower Extremity;  Surgeon: Jackquelyn Mass, MD;  Location: [facility];  Service: Cardiovascular;  Laterality: N/A;   PERIPHERAL VASCULAR CATHETERIZATION  02/06/2016   Procedure: Lower Extremity Intervention;  Surgeon: Jackquelyn Mass, MD;  Location: [facility];  Service: Cardiovascular;;   Patient Active Problem List   Diagnosis Date Noted   Cervicalgia 03/21/2023   OSA on CPAP    Snoring 10/15/2022   PAD (peripheral artery disease) (HCC) 10/15/2022   Overweight 09/12/2022   Needs flu shot 09/12/2022   Type 2 myocardial infarction (HCC) 04/14/2022   Nausea, vomiting, and diarrhea 03/29/2022   Preventative health care 03/01/2022   Black stools 01/08/2022   Chronic neck pain 07/12/2021   Chronic pain of both shoulders 07/12/2021   Sensation of fullness in both ears 04/05/2021   History of cocaine use 03/02/2021   CAD in native artery 01/31/2021   S/P angioplasty with stent 01/30/21 to LAD  01/31/2021   Unstable angina (HCC) 01/30/2021   Chest pain    B12 deficiency 09/13/2020   Family history of malignant neoplasm of digestive organs 08/08/2020   Rectal bleeding 08/08/2020   Body mass index (BMI) 27.0-27.9, adult 04/07/2020   Laceration of scalp without complication 03/23/2020  Tubular adenoma of colon 11/03/2017   Type 2 diabetes mellitus (HCC) 06/18/2016   Hepatitis B immune 03/11/2016   Atherosclerosis with claudication of extremity (HCC) 01/29/2016   Arthralgia 10/26/2014   Gastroesophageal reflux disease 06/23/2013   Tobacco use disorder 04/27/2013   Erectile dysfunction 10/27/2012   Insomnia 04/15/2012   Generalized anxiety disorder 02/26/2011    Class: Acute   Suicide attempt (HCC) 02/20/2011   HIV (human immunodeficiency virus infection) (HCC) 11/01/2010   Essential (primary) hypertension 11/01/2010   Hyperlipidemia 11/01/2010    PCP: Zorita Hiss, NP  REFERRING PROVIDER: Syliva Even, MD   REFERRING DIAG:  M54.2 (ICD-10-CM) - Cervicalgia M25.511,G89.29,M25.512 (ICD-10-CM) - Chronic pain of both shoulders M54.50,G89.29 (ICD-10-CM) - Chronic bilateral low back pain without sciatica  Rationale for Evaluation and Treatment: Rehabilitation  THERAPY DIAG:  Cervicalgia  Chronic pain of both shoulders  Muscle weakness (generalized)  Other low back pain  Other abnormalities of gait and mobility  ONSET DATE: Chronic  SUBJECTIVE:                                                                                                                                                                                           SUBJECTIVE STATEMENT: 05/13/2023 Pt presents to PT with reports of continued neck pain, lower back is doing better. Has continued HEP compliance.   PERTINENT HISTORY:  HTN, subdural hemorrhage   PAIN:  Are you having pain?  Yes: NPRS scale: 5/10 Worst: 10/10 Pain location: neck and UE, L>R Pain description: pain, sore, N/T Aggravating factors: driving, across body reaching, certain cervical movements Relieving factors: medication  Yes: NPRS scale: 1/10 Worst: 10/10 Pain location: lower back Pain description: sore, tight Aggravating factors: prolonged walking Relieving factors: medication  PRECAUTIONS: None  RED FLAGS: None   WEIGHT BEARING RESTRICTIONS: No  FALLS:  Has patient fallen in last 6 months? No  LIVING ENVIRONMENT: Lives with: lives with their family Lives in: House/apartment  OCCUPATION: Retired  PLOF: Independent  PATIENT GOALS: pt wants to decrease pain in neck/UE and lower back in order to improve comfort with yard work and walk his dog greater than 1/4 mile  NEXT MD VISIT: 06/05/2023  OBJECTIVE:  Note: Objective measures were completed at Evaluation unless otherwise noted.  DIAGNOSTIC FINDINGS:  See imaging   PATIENT SURVEYS:  Modified Oswestry: 18/50 - 36%  disability NDI: 14/50 - 28% disability  COGNITION: Overall cognitive status: Within functional limits for tasks assessed     SENSATION: Light touch: Impaired - L UE  POSTURE: rounded shoulders, forward head, and increased lumbar lordosis  PALPATION: TTP to bilateral upper trap  CERVICAL ROM:   AROM eval  Flexion   Extension   Right lateral flexion   Left lateral flexion   Right rotation 45  Left rotation 38   (Blank rows = not tested)  UPPER EXTREMITY ROM:  AROM Right Eval Left Eval  Shoulder flexion 170 125  Shoulder extension    Shoulder abduction    Shoulder internal rotation    Shoulder external rotation    Elbow flexion    Elbow extension    Wrist flexion    Wrist extension    (Blank rows = not tested)  UPPER EXTREMITY MMT:  MMT Right Eval Left Eval  Shoulder flexion    Shoulder abduction    Shoulder IR 4/5 5/5  Shoulder ER 4/5 5/5  Shoulder extension    Upper trap    Middle trap    Lower trap    Elbow flexion     Elbow extension    Wrist flexion    Wrist extension    Grip 70lbs 60lbs   (Blank rows = not tested)  LUMBAR SPECIAL TESTS:  Straight leg raise test: Negative and Slump test: Negative  FUNCTIONAL TESTS:  30 Sec Sit to Stand: 10 reps no UE DNF Endurance: 8 seconds  GAIT: Distance walked: 48ft Assistive device utilized: None Level of assistance: Complete Independence Comments: trunk flexed  TREATMENT: [facility] Adult PT Treatment:                                                DATE: 05/13/23 Therapeutic Activity: Nu-step L5 x 5 min UE/LE for functional activity tolerance Supine chin tuck x 10 - 3" hold Supine horizontal abd 2x15 GTB Therapeutic Exercise:  Seated bilateral ER 2x10 RTB Pallof press 2x10 10# Seated low row 2x10 25# Seated lat pulldown 2x10 35# Manual Therapy: Sub-occipital release Positional release bilateral upper trap Manual cervical traction Cervical side glides C5 grade II TPR to bilateral upper  trap  [facility] Adult PT Treatment:                                                DATE: 05/08/23 Therapeutic Exercise: UBE lvl 1.0 x 4 min for functional activity tolerance Supine chin tuck x 10 - 3" hold Supine horizontal abd 2x15 RTB Pilates bridge 2x10 Pilates SLR 2x10  Supine PPT x 10 - 3" hold Supine PPT with ball 2x10 - 3" hold Seated bilateral ER 2x15 RTB FM row 2x10 20# Pallof press 2x10 7# each Standing chop 2x10 7# each  [facility] Adult PT Treatment:                                                DATE: 05/06/23 Therapeutic Activity: Nu-step L5 x 5 min UE/LE for functional activity tolerance Supine chin tuck x 10 - 3" hold Supine horizontal abd 2x10 RTB Therapeutic Exercise:  Pilates bridge 2x10 Pilates SLR 2x10  FM row 2x10 20# Pallof press 2x10 7# Manual Therapy: Sub-occipital release Positional release bilateral upper trap Manual cervical traction Cervical side glides C5 grade II TPR to bilateral upper trap  [facility] Adult PT  Treatment:                                                DATE: 04/30/23 Sub-occipital release Tack and stretch of the sub-occipitals.  MTPR along the cervical paraspinals and upper traps Nu-step L5 x 5 min UE/LE Standing Rows 2 x 12 with GTB Standing shoulder extension 2 x 12 with GTB Palloff press 1 x 10 bil Dead bug 5 x 10 sec holds Updated HEP today  [facility] Adult PT Treatment:                                                DATE: 04/28/2023 Reviewed HEP  Upper trap stretch 2 x 30 sec reaching toward  PPT with tactile feedback Supine PPT with marching 2 x 20 LTR 1 x 20  Head tuck chin lift 5 x holding 10 seconds Seated anterior pelvic tilt 1 x 10 holding 5 - 10 sec Reviewed posture and spinal position Updated HEP today  [facility] Adult PT Treatment:                                                DATE: 04/23/2023 Therapeutic Exercise: Upper trap x 30" each Row x 10 blue band Supine chin tuck x 5 - 5" hold Seated bilateral ER x 10 RTB  PATIENT  EDUCATION:  Education details: HEP update Person educated: Patient Education method: Explanation, Demonstration, and Handouts Education comprehension: verbalized understanding and returned demonstration  HOME EXERCISE PROGRAM: Access Code: [email or web address] URL: [email or web address] Date: 05/06/2023 Prepared by: Loral Roch  Exercises - Seated Upper Trapezius Stretch  - 1 x daily - 7 x weekly - 2 sets - 2 reps - 30 seconds hold - Standing Shoulder Row with Anchored Resistance  - 1 x daily - 7 x weekly - 3 sets - 10 reps - blue band hold - Supine Chin Tuck  - 1 x daily - 7 x weekly - 2 sets - 10 reps - Supine Deep Neck Flexor Training - Repetitions  - 1 x daily - 7 x weekly - 2 sets - 10 reps - 10-20 seconds hold - Shoulder External Rotation and Scapular Retraction with Resistance  - 1 x daily - 7 x weekly - 3 sets - 10 reps - red band hold - Supine Posterior Pelvic Tilt  - 1 x daily - 7 x weekly - 2 sets - 20 reps - holding 10 sec hold - Supine March with Posterior Pelvic Tilt  - 1 x daily - 7 x weekly - 2 sets - 10 reps - Seated Anterior Pelvic Tilt  - 1 x daily - 7 x weekly - 2 sets - 10 reps - 5 seconds hold - Lower Trunk Rotation  - 1 x daily - 7 x weekly - 2 sets - 10 reps - Isometric Dead Bug  - 1 x daily - 7 x weekly - 2 sets - 10 reps - holding 10 seconds hold - Standing Anti-Rotation Press with Anchored Resistance  - 1 x daily - 7 x weekly - 2 sets - 10 reps - 1  sec hold - Supine Bridge with Resistance Band  - 1 x daily - 7 x weekly - 2 sets - 10 reps - blue band hold  ASSESSMENT:  CLINICAL IMPRESSION: 05/13/2023 Pt was able to complete all prescribed exercises with no adverse effect. Today therapy focused on manual therapy for decreasing pain and strengthening exercises focused on DNF/periscapular muscles as well as core/hip in order to decrease pain and improve function. No changes to HEP made today. Pt is progressing well with therapy, will continue to progress as  able.  Evaluation: Patient is a 63 y.o. M who was seen today for physical therapy evaluation and treatment for chronic neck and LBP. Physical findings are consistent with physician impression as pt demonstrates decrease in cervical ROM as well as postural and hip/core strength deficits. ODI and NDI scores suggest minimal/moderate disability in performance of home ADLs and community level activity, indicating he is operating well below PLOF. Pt would benefit from skilled PT services working on improving strength of postural muscles and multimodal approach of manual and exercise to decrease pain and improve function.    OBJECTIVE IMPAIRMENTS: decreased activity tolerance, decreased endurance, decreased mobility, difficulty walking, decreased ROM, decreased strength, and pain  ACTIVITY LIMITATIONS: carrying, lifting, sitting, standing, squatting, stairs, reach over head, and locomotion level  PARTICIPATION LIMITATIONS: driving, shopping, community activity, occupation, and yard work  PERSONAL FACTORS: Time since onset of injury/illness/exacerbation and 1-2 comorbidities: HTN, subdural hemorrhage  are also affecting patient's functional outcome.   REHAB POTENTIAL: Good  CLINICAL DECISION MAKING: Evolving/moderate complexity  EVALUATION COMPLEXITY: Moderate   GOALS: Goals reviewed with patient? No  SHORT TERM GOALS: Target date: 05/14/2023   Pt will be compliant and knowledgeable with initial HEP for improved comfort and carryover Baseline: initial HEP given  Goal status: INITIAL  2.  Pt will self report neck/UE and lower back pain no greater than 7/10 for improved comfort and functional ability Baseline: 10/10 at worst Goal status: INITIAL   LONG TERM GOALS: Target date: 06/18/2023   Pt will be decrease ODI disability score to no greater than 20% disability as proxy for functional improvement with home ADLs and community activites Baseline: 18/50 - 36% disability Goal status:  INITIAL  2.  Pt will decrease NDI disability score to no greater than 20% disability as proxy for functional improvement with ADLs and community activities Baseline: 28% disability Goal status: INITIAL   3.  Pt will increase 30 Second Sit to Stand rep count to no less than 12 reps for improved balance, strength, and functional mobility Baseline: 10 reps  Goal status: INITIAL   4.  Pt will improve walking distance to at least 1 mile while walking his dog with decreased LBP for improved comfort with desired recreational activity Baseline: 1/4 mile Goal status: INITIAL  5.  Pt will improve bilateral cervical rotation to no less than 60 degrees for improved comfort and functional ability with driving Baseline: see ROM chart Goal status: INITIAL  6.  Pt will improve L grip strength to at least 70lbs for improved functional ability with grasp and ADL tasks Baseline: see ROM chart Goal status: INITIAL  PLAN:  PT FREQUENCY: 2x/week  PT DURATION: 8 weeks  PLANNED INTERVENTIONS: 97164- PT Re-evaluation, 97110-Therapeutic exercises, 97530- Therapeutic activity, [id number]- Neuromuscular re-education, 97535- Self Care, [id number]- Manual therapy, [id number]- Gait training, [id number]- Aquatic Therapy, [id number]- Electrical stimulation (unattended), [id number]- Electrical stimulation (manual), 97016- Vasopneumatic device, Dry Needling, Cryotherapy, and Moist heat.  PLAN FOR NEXT SESSION: progressive HEP PRN ,  periscapular/DNF, hip/core strength.   Ivor Mars PT  05/13/23 10:57 AM

## 2023-05-15 ENCOUNTER — Other Ambulatory Visit

## 2023-05-15 ENCOUNTER — Ambulatory Visit: Payer: Medicaid Other | Admitting: Nurse Practitioner

## 2023-05-15 VITALS — BP 110/64 | HR 74 | Temp 97.8°F | Ht 69.0 in | Wt 181.0 lb

## 2023-05-15 DIAGNOSIS — Z7984 Long term (current) use of oral hypoglycemic drugs: Secondary | ICD-10-CM | POA: Diagnosis not present

## 2023-05-15 DIAGNOSIS — M6530 Trigger finger, unspecified finger: Secondary | ICD-10-CM

## 2023-05-15 DIAGNOSIS — E1165 Type 2 diabetes mellitus with hyperglycemia: Secondary | ICD-10-CM

## 2023-05-15 DIAGNOSIS — M542 Cervicalgia: Secondary | ICD-10-CM | POA: Diagnosis not present

## 2023-05-15 LAB — BASIC METABOLIC PANEL WITH GFR
BUN: 15 mg/dL (ref 6–23)
CO2: 24 meq/L (ref 19–32)
Calcium: 9.4 mg/dL (ref 8.4–10.5)
Chloride: 107 meq/L (ref 96–112)
Creatinine, Ser: 1.3 mg/dL (ref 0.40–1.50)
GFR: 58.08 mL/min — ABNORMAL LOW (ref >60.00)
Glucose, Bld: 111 mg/dL — ABNORMAL HIGH (ref 70–99)
Potassium: 4 meq/L (ref 3.5–5.1)
Sodium: 137 meq/L (ref 135–145)

## 2023-05-15 MED ORDER — LANCET DEVICE MISC: 1.0000 | Freq: Every day | 0 refills | Status: AC

## 2023-05-15 MED ORDER — BLOOD GLUCOSE TEST VI STRP
1.0000 | ORAL_STRIP | Freq: Every day | 11 refills | Status: AC
Start: 2023-05-15 — End: 2024-05-09

## 2023-05-15 MED ORDER — LANCETS MISC. MISC: 1.0000 | Freq: Every day | 11 refills | Status: AC

## 2023-05-15 MED ORDER — BLOOD GLUCOSE MONITORING SUPPL DEVI: 1.0000 | Freq: Every day | 0 refills | Status: DC

## 2023-05-15 MED ORDER — METHOCARBAMOL 500 MG PO TABS: 500.0000 mg | ORAL_TABLET | Freq: Three times a day (TID) | ORAL | 1 refills | Status: DC | PRN

## 2023-05-15 NOTE — Assessment & Plan Note (Signed)
 Chronic, much improved Encourage patient to continue work with physical therapy. Per shared decision making we will discontinue cyclobenzaprine  and will send in methocarbamol  the patient can take as needed for muscle relaxer in hopes that this may be a little bit less sedating.  Patient warned to not mix cyclobenzaprine  and methocarbamol , he reports his understanding.

## 2023-05-15 NOTE — Assessment & Plan Note (Addendum)
 Chronic Check A1c today Patient declined updated urine testing to check for albuminuria, consider this at next lab draw.  Continue Jardiance  25mg /day, continue lisinopril , continue rosuvastatin .

## 2023-05-15 NOTE — Progress Notes (Signed)
 Established Patient Office Visit  Subjective   Patient ID: Jacob Ali, male    DOB: [birth year]  Age: 63 y.o. MRN: [id number]  Chief Complaint  Patient presents with   cervicalgia    Cervicalgia: Much improved since seeing sports medicine and working with physical therapy.  He will take cyclobenzaprine  as needed but reports daytime drowsiness the next day.  Trigger Finger: Reports having had trigger finger for 10 years.  The third digit on his left hand is most bothersome.  He has history of having steroid joint injections in the hands for treatment of trigger finger.  He would like to consider this treatment again.  T2DM: On Jardiance  25 mg daily, is on ACE inhibitor and rosuvastatin .  Last A1c 4.2.  Needs a new glucometer as his broke.  Denies any symptoms of hypo or hyperglycemia.  No evidence of albuminuria on last check.    ROS: see HPI    Objective:     BP 110/64   Pulse 74   Temp 97.8 F (36.6 C) (Temporal)   Ht 5\' 9"  (1.753 m)   Wt 181 lb (82.1 kg)   SpO2 95%   BMI 26.73 kg/m    Physical Exam Vitals reviewed.  Constitutional:      Appearance: Normal appearance.  HENT:     Head: Normocephalic and atraumatic.  Cardiovascular:     Rate and Rhythm: Normal rate and regular rhythm.  Pulmonary:     Effort: Pulmonary effort is normal.     Breath sounds: Normal breath sounds.  Musculoskeletal:     Cervical back: Neck supple.  Skin:    General: Skin is warm and dry.  Neurological:     Mental Status: He is alert and oriented to person, place, and time.  Psychiatric:        Mood and Affect: Mood normal.        Behavior: Behavior normal.        Thought Content: Thought content normal.        Judgment: Judgment normal.      No results found for any visits on 05/15/23.  Last metabolic panel Lab Results  Component Value Date   GLUCOSE 92 11/19/2022   NA 141 11/19/2022   K 4.0 11/19/2022   CL 107 (H) 11/19/2022   CO2 19 (L) 11/19/2022   BUN 19  11/19/2022   CREATININE 1.20 11/19/2022   EGFR 68 11/19/2022   CALCIUM  9.6 11/19/2022   PHOS 2.1 (L) 03/31/2022   PROT 6.9 11/19/2022   ALBUMIN 4.8 11/19/2022   LABGLOB 2.1 11/19/2022   AGRATIO 2.0 04/10/2022   BILITOT 0.5 11/19/2022   ALKPHOS 50 11/19/2022   AST 20 11/19/2022   ALT 19 11/19/2022   ANIONGAP 9 04/02/2022   Last hemoglobin A1c Lab Results  Component Value Date   HGBA1C 4.2 09/13/2022      The ASCVD Risk score (Arnett DK, et al., 2019) failed to calculate for the following reasons:   Risk score cannot be calculated because patient has a medical history suggesting prior/existing ASCVD    Assessment & Plan:   Problem List Items Addressed This Visit       Endocrine   Type 2 diabetes mellitus (HCC)   Chronic Check A1c today Patient declined updated urine testing to check for albuminuria, consider this at next lab draw.  Continue Jardiance  25mg /day, continue lisinopril , continue rosuvastatin .       Relevant Medications   Blood Glucose Monitoring Suppl DEVI  Glucose Blood (BLOOD GLUCOSE TEST STRIPS) STRP   Lancet Device MISC   Lancets Misc. MISC   Other Relevant Orders   Hemoglobin A1c   Basic metabolic panel with GFR     Musculoskeletal and Integument   Trigger finger of left hand   Chronic, experiencing more pain recently as well as clicking and sticking specifically to the third digit on his left hand. Would like referral to Ortho care, referral ordered per patient request today.      Relevant Orders   Ambulatory referral to Orthopedic Surgery     Other   Cervicalgia - Primary   Chronic, much improved Encourage patient to continue work with physical therapy. Per shared decision making we will discontinue cyclobenzaprine  and will send in methocarbamol  the patient can take as needed for muscle relaxer in hopes that this may be a little bit less sedating.  Patient warned to not mix cyclobenzaprine  and methocarbamol , he reports his  understanding.      Relevant Medications   methocarbamol  (ROBAXIN ) 500 MG tablet    Return in about 6 months (around 11/15/2023) for F/U with Jacob Ali.    Jacob Hiss, NP

## 2023-05-15 NOTE — Assessment & Plan Note (Signed)
 Chronic, experiencing more pain recently as well as clicking and sticking specifically to the third digit on his left hand. Would like referral to Ortho care, referral ordered per patient request today.

## 2023-05-16 ENCOUNTER — Encounter: Payer: Self-pay | Admitting: Nurse Practitioner

## 2023-05-16 ENCOUNTER — Ambulatory Visit: Attending: Family Medicine

## 2023-05-16 ENCOUNTER — Other Ambulatory Visit: Payer: Self-pay | Admitting: Nurse Practitioner

## 2023-05-16 DIAGNOSIS — G8929 Other chronic pain: Secondary | ICD-10-CM | POA: Diagnosis present

## 2023-05-16 DIAGNOSIS — M542 Cervicalgia: Secondary | ICD-10-CM | POA: Diagnosis present

## 2023-05-16 DIAGNOSIS — M6281 Muscle weakness (generalized): Secondary | ICD-10-CM | POA: Insufficient documentation

## 2023-05-16 DIAGNOSIS — M5459 Other low back pain: Secondary | ICD-10-CM | POA: Insufficient documentation

## 2023-05-16 DIAGNOSIS — M25511 Pain in right shoulder: Secondary | ICD-10-CM | POA: Insufficient documentation

## 2023-05-16 DIAGNOSIS — R2689 Other abnormalities of gait and mobility: Secondary | ICD-10-CM | POA: Insufficient documentation

## 2023-05-16 DIAGNOSIS — E785 Hyperlipidemia, unspecified: Secondary | ICD-10-CM

## 2023-05-16 DIAGNOSIS — M25512 Pain in left shoulder: Secondary | ICD-10-CM | POA: Diagnosis present

## 2023-05-16 LAB — HEMOGLOBIN A1C: Hgb A1c MFr Bld: <4.2 % (ref <5.7)

## 2023-05-16 MED ORDER — FENOFIBRATE 48 MG PO TABS: 48.0000 mg | ORAL_TABLET | Freq: Every day | ORAL | 1 refills | Status: DC

## 2023-05-16 NOTE — Therapy (Signed)
 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   Subdural hemorrhage (HCC)    Tubular adenoma of colon 11/03/2017   Colonoscopy Sept 9, 2019   Past Surgical History:  Procedure Laterality Date   CARDIAC CATHETERIZATION     CORONARY STENT INTERVENTION N/A 01/30/2021   Procedure: CORONARY STENT INTERVENTION;  Surgeon: Arty Binning, MD;  Location: [facility];  Service: Cardiovascular;  Laterality: N/A;   Examination under anesthesia, repair of anal fissure,  12/04/2000   LEFT HEART CATH AND CORONARY ANGIOGRAPHY N/A 01/30/2021   Procedure: LEFT HEART CATH AND CORONARY ANGIOGRAPHY;  Surgeon: Arty Binning, MD;  Location: [facility];  Service: Cardiovascular;  Laterality: N/A;   PERIPHERAL VASCULAR CATHETERIZATION Left 02/06/2016   Procedure: Lower Extremity Angiography;  Surgeon: Jackquelyn Mass, MD;  Location: [facility];  Service: Cardiovascular;  Laterality:  Left;   PERIPHERAL VASCULAR CATHETERIZATION N/A 02/06/2016   Procedure: Abdominal Aortogram w/Lower Extremity;  Surgeon: Jackquelyn Mass, MD;  Location: [facility];  Service: Cardiovascular;  Laterality: N/A;   PERIPHERAL VASCULAR CATHETERIZATION  02/06/2016   Procedure: Lower Extremity Intervention;  Surgeon: Jackquelyn Mass, MD;  Location: [facility];  Service: Cardiovascular;;   Patient Active Problem List   Diagnosis Date Noted   Trigger finger of left hand 05/15/2023   Cervicalgia 03/21/2023   OSA on CPAP    Snoring 10/15/2022   PAD (peripheral artery disease) (HCC) 10/15/2022   Overweight 09/12/2022   Needs flu shot 09/12/2022   Type 2 myocardial infarction (HCC) 04/14/2022   Nausea, vomiting, and diarrhea 03/29/2022   Preventative health care 03/01/2022   Black stools 01/08/2022   Chronic neck pain 07/12/2021   Chronic pain of both shoulders 07/12/2021   Sensation of fullness in both ears 04/05/2021   History of cocaine use 03/02/2021   CAD in native artery 01/31/2021   S/P angioplasty with stent 01/30/21 to LAD  01/31/2021   Unstable angina (HCC) 01/30/2021   Chest pain    B12 deficiency 09/13/2020   Family history of malignant neoplasm of digestive organs 08/08/2020   Rectal bleeding 08/08/2020   Body mass index (BMI) 27.0-27.9, adult 04/07/2020  Laceration of scalp without complication 03/23/2020   Tubular adenoma of colon 11/03/2017   Type 2 diabetes mellitus (HCC) 06/18/2016   Hepatitis B immune 03/11/2016   Atherosclerosis with claudication of extremity (HCC) 01/29/2016   Arthralgia 10/26/2014   Gastroesophageal reflux disease 06/23/2013   Tobacco use disorder 04/27/2013   Erectile dysfunction 10/27/2012   Insomnia 04/15/2012   Generalized anxiety disorder 02/26/2011    Class: Acute   Suicide attempt (HCC) 02/20/2011   HIV (human immunodeficiency virus infection) (HCC) 11/01/2010   Essential (primary) hypertension 11/01/2010    Hyperlipidemia 11/01/2010    PCP: Zorita Hiss, NP  REFERRING PROVIDER: Syliva Even, MD   REFERRING DIAG:  M54.2 (ICD-10-CM) - Cervicalgia M25.511,G89.29,M25.512 (ICD-10-CM) - Chronic pain of both shoulders M54.50,G89.29 (ICD-10-CM) - Chronic bilateral low back pain without sciatica  Rationale for Evaluation and Treatment: Rehabilitation  THERAPY DIAG:  Cervicalgia  Chronic pain of both shoulders  Muscle weakness (generalized)  Other low back pain  Other abnormalities of gait and mobility  ONSET DATE: Chronic  SUBJECTIVE:                                                                                                                                                                                           SUBJECTIVE STATEMENT: 05/16/2023 Pt presents to PT with reports of changing up his statin medication with hopes it will decrease muscle pain. Has been compliant with HEP.   PERTINENT HISTORY:  HTN, subdural hemorrhage   PAIN:  Are you having pain?  Yes: NPRS scale: 5/10 Worst: 10/10 Pain location: neck and UE, L>R Pain description: pain, sore, N/T Aggravating factors: driving, across body reaching, certain cervical movements Relieving factors: medication  Yes: NPRS scale: 1/10 Worst: 10/10 Pain location: lower back Pain description: sore, tight Aggravating factors: prolonged walking Relieving factors: medication  PRECAUTIONS: None  RED FLAGS: None   WEIGHT BEARING RESTRICTIONS: No  FALLS:  Has patient fallen in last 6 months? No  LIVING ENVIRONMENT: Lives with: lives with their family Lives in: House/apartment  OCCUPATION: Retired  PLOF: Independent  PATIENT GOALS: pt wants to decrease pain in neck/UE and lower back in order to improve comfort with yard work and walk his dog greater than 1/4 mile  NEXT MD VISIT: 06/05/2023  OBJECTIVE:  Note: Objective measures were completed at Evaluation unless otherwise noted.  DIAGNOSTIC FINDINGS:  See  imaging   PATIENT SURVEYS:  Modified Oswestry: 18/50 - 36% disability NDI: 14/50 - 28% disability  COGNITION: Overall cognitive status: Within functional limits for tasks assessed     SENSATION: Light touch: Impaired - L UE  POSTURE: rounded shoulders, forward  head, and increased lumbar lordosis  PALPATION: TTP to bilateral upper trap  CERVICAL ROM:   AROM eval  Flexion   Extension   Right lateral flexion   Left lateral flexion   Right rotation 45  Left rotation 38   (Blank rows = not tested)  UPPER EXTREMITY ROM:  AROM Right Eval Left Eval  Shoulder flexion 170 125  Shoulder extension    Shoulder abduction    Shoulder internal rotation    Shoulder external rotation    Elbow flexion    Elbow extension    Wrist flexion    Wrist extension    (Blank rows = not tested)  UPPER EXTREMITY MMT:  MMT Right Eval Left Eval  Shoulder flexion    Shoulder abduction    Shoulder IR 4/5 5/5  Shoulder ER 4/5 5/5  Shoulder extension    Upper trap    Middle trap    Lower trap    Elbow flexion     Elbow extension    Wrist flexion    Wrist extension    Grip 70lbs 60lbs   (Blank rows = not tested)  LUMBAR SPECIAL TESTS:  Straight leg raise test: Negative and Slump test: Negative  FUNCTIONAL TESTS:  30 Sec Sit to Stand: 10 reps no UE DNF Endurance: 8 seconds  GAIT: Distance walked: 81ft Assistive device utilized: None Level of assistance: Complete Independence Comments: trunk flexed  TREATMENT: [facility] Adult PT Treatment:                                                DATE: 05/16/23 Nu-step L5 x 5 min UE/LE for functional activity tolerance Supine chin tuck x 10 - 3" hold Supine horizontal abd 2x15 GTB Hooklying clamshell 2x15 GTB Bridge with GTB 2x10 Supine pilates SLR 2x10 each Seated bilateral ER 2x15 GTB Pallof press x 10 13# FM row 2x12 20# FM extension 2x10 20# Seated low row 2x10 25# Seated lat pulldown 2x10 35#  [facility] Adult PT Treatment:                                                 DATE: 05/13/23 Therapeutic Activity: Nu-step L5 x 5 min UE/LE for functional activity tolerance Supine chin tuck x 10 - 3" hold Supine horizontal abd 2x15 GTB Therapeutic Exercise:  Seated bilateral ER 2x10 RTB Pallof press 2x10 10# Seated low row 2x10 25# Seated lat pulldown 2x10 35# Manual Therapy: Sub-occipital release Positional release bilateral upper trap Manual cervical traction Cervical side glides C5 grade II TPR to bilateral upper trap  [facility] Adult PT Treatment:                                                DATE: 05/08/23 Therapeutic Exercise: UBE lvl 1.0 x 4 min for functional activity tolerance Supine chin tuck x 10 - 3" hold Supine horizontal abd 2x15 RTB Pilates bridge 2x10 Pilates SLR 2x10  Supine PPT x 10 - 3" hold Supine PPT with ball 2x10 - 3" hold Seated bilateral ER 2x15 RTB FM row 2x10 20# Pallof press 2x10  7# each Standing chop 2x10 7# each  [facility] Adult PT Treatment:                                                DATE: 05/06/23 Therapeutic Activity: Nu-step L5 x 5 min UE/LE for functional activity tolerance Supine chin tuck x 10 - 3" hold Supine horizontal abd 2x10 RTB Therapeutic Exercise:  Pilates bridge 2x10 Pilates SLR 2x10  FM row 2x10 20# Pallof press 2x10 7# Manual Therapy: Sub-occipital release Positional release bilateral upper trap Manual cervical traction Cervical side glides C5 grade II TPR to bilateral upper trap  [facility] Adult PT Treatment:                                                DATE: 04/30/23 Sub-occipital release Tack and stretch of the sub-occipitals.  MTPR along the cervical paraspinals and upper traps Nu-step L5 x 5 min UE/LE Standing Rows 2 x 12 with GTB Standing shoulder extension 2 x 12 with GTB Palloff press 1 x 10 bil Dead bug 5 x 10 sec holds Updated HEP today  PATIENT EDUCATION:  Education details: HEP update Person educated: Patient Education method: Explanation,  Demonstration, and Handouts Education comprehension: verbalized understanding and returned demonstration  HOME EXERCISE PROGRAM: Access Code: [email or web address] URL: [email or web address] Date: 05/06/2023 Prepared by: Loral Roch  Exercises - Seated Upper Trapezius Stretch  - 1 x daily - 7 x weekly - 2 sets - 2 reps - 30 seconds hold - Standing Shoulder Row with Anchored Resistance  - 1 x daily - 7 x weekly - 3 sets - 10 reps - blue band hold - Supine Chin Tuck  - 1 x daily - 7 x weekly - 2 sets - 10 reps - Supine Deep Neck Flexor Training - Repetitions  - 1 x daily - 7 x weekly - 2 sets - 10 reps - 10-20 seconds hold - Shoulder External Rotation and Scapular Retraction with Resistance  - 1 x daily - 7 x weekly - 3 sets - 10 reps - red band hold - Supine Posterior Pelvic Tilt  - 1 x daily - 7 x weekly - 2 sets - 20 reps - holding 10 sec hold - Supine March with Posterior Pelvic Tilt  - 1 x daily - 7 x weekly - 2 sets - 10 reps - Seated Anterior Pelvic Tilt  - 1 x daily - 7 x weekly - 2 sets - 10 reps - 5 seconds hold - Lower Trunk Rotation  - 1 x daily - 7 x weekly - 2 sets - 10 reps - Isometric Dead Bug  - 1 x daily - 7 x weekly - 2 sets - 10 reps - holding 10 seconds hold - Standing Anti-Rotation Press with Anchored Resistance  - 1 x daily - 7 x weekly - 2 sets - 10 reps - 1 sec hold - Supine Bridge with Resistance Band  - 1 x daily - 7 x weekly - 2 sets - 10 reps - blue band hold  ASSESSMENT:  CLINICAL IMPRESSION: 05/16/2023 Pt was able to complete all prescribed exercises with no adverse effect. Today therapy focused on exercises for decreasing pain and strengthening for DNF/periscapular muscles as well as  core/hip in order to improve function. No changes to HEP made today. Pt is progressing well with therapy, will continue to progress as able.  Evaluation: Patient is a 63 y.o. M who was seen today for physical therapy evaluation and treatment for chronic neck and LBP. Physical  findings are consistent with physician impression as pt demonstrates decrease in cervical ROM as well as postural and hip/core strength deficits. ODI and NDI scores suggest minimal/moderate disability in performance of home ADLs and community level activity, indicating he is operating well below PLOF. Pt would benefit from skilled PT services working on improving strength of postural muscles and multimodal approach of manual and exercise to decrease pain and improve function.    OBJECTIVE IMPAIRMENTS: decreased activity tolerance, decreased endurance, decreased mobility, difficulty walking, decreased ROM, decreased strength, and pain  ACTIVITY LIMITATIONS: carrying, lifting, sitting, standing, squatting, stairs, reach over head, and locomotion level  PARTICIPATION LIMITATIONS: driving, shopping, community activity, occupation, and yard work  PERSONAL FACTORS: Time since onset of injury/illness/exacerbation and 1-2 comorbidities: HTN, subdural hemorrhage  are also affecting patient's functional outcome.   REHAB POTENTIAL: Good  CLINICAL DECISION MAKING: Evolving/moderate complexity  EVALUATION COMPLEXITY: Moderate   GOALS: Goals reviewed with patient? No  SHORT TERM GOALS: Target date: 05/14/2023   Pt will be compliant and knowledgeable with initial HEP for improved comfort and carryover Baseline: initial HEP given  Goal status: INITIAL  2.  Pt will self report neck/UE and lower back pain no greater than 7/10 for improved comfort and functional ability Baseline: 10/10 at worst Goal status: INITIAL   LONG TERM GOALS: Target date: 06/18/2023   Pt will be decrease ODI disability score to no greater than 20% disability as proxy for functional improvement with home ADLs and community activites Baseline: 18/50 - 36% disability Goal status: INITIAL  2.  Pt will decrease NDI disability score to no greater than 20% disability as proxy for functional improvement with ADLs and community  activities Baseline: 28% disability Goal status: INITIAL   3.  Pt will increase 30 Second Sit to Stand rep count to no less than 12 reps for improved balance, strength, and functional mobility Baseline: 10 reps  Goal status: INITIAL   4.  Pt will improve walking distance to at least 1 mile while walking his dog with decreased LBP for improved comfort with desired recreational activity Baseline: 1/4 mile Goal status: INITIAL  5.  Pt will improve bilateral cervical rotation to no less than 60 degrees for improved comfort and functional ability with driving Baseline: see ROM chart Goal status: INITIAL  6.  Pt will improve L grip strength to at least 70lbs for improved functional ability with grasp and ADL tasks Baseline: see ROM chart Goal status: INITIAL  PLAN:  PT FREQUENCY: 2x/week  PT DURATION: 8 weeks  PLANNED INTERVENTIONS: [id number]- PT Re-evaluation, 97110-Therapeutic exercises, 97530- Therapeutic activity, [id number]- Neuromuscular re-education, 97535- Self Care, [id number]- Manual therapy, [id number]- Gait training, [id number]- Aquatic Therapy, [id number]- Electrical stimulation (unattended), [id number]- Electrical stimulation (manual), 97016- Vasopneumatic device, Dry Needling, Cryotherapy, and Moist heat.  PLAN FOR NEXT SESSION: progressive HEP PRN , periscapular/DNF, hip/core strength.   Ivor Mars PT  05/16/23 10:10 AM

## 2023-05-20 ENCOUNTER — Ambulatory Visit

## 2023-05-20 DIAGNOSIS — M6281 Muscle weakness (generalized): Secondary | ICD-10-CM

## 2023-05-20 DIAGNOSIS — M542 Cervicalgia: Secondary | ICD-10-CM | POA: Diagnosis not present

## 2023-05-20 DIAGNOSIS — M5459 Other low back pain: Secondary | ICD-10-CM

## 2023-05-20 DIAGNOSIS — G8929 Other chronic pain: Secondary | ICD-10-CM

## 2023-05-20 NOTE — Therapy (Signed)
 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   Subdural hemorrhage (HCC)    Tubular adenoma of colon 11/03/2017   Colonoscopy Sept 9, 2019   Past Surgical History:  Procedure Laterality Date   CARDIAC CATHETERIZATION     CORONARY STENT INTERVENTION N/A 01/30/2021   Procedure: CORONARY STENT INTERVENTION;  Surgeon: Arty Binning, MD;  Location: [facility];  Service: Cardiovascular;  Laterality: N/A;   Examination under anesthesia, repair of anal fissure,  12/04/2000   LEFT HEART CATH AND CORONARY ANGIOGRAPHY N/A 01/30/2021   Procedure: LEFT HEART CATH AND CORONARY ANGIOGRAPHY;  Surgeon: Arty Binning, MD;  Location: [facility];  Service: Cardiovascular;  Laterality: N/A;   PERIPHERAL VASCULAR CATHETERIZATION Left 02/06/2016   Procedure: Lower Extremity Angiography;  Surgeon: Jackquelyn Mass, MD;  Location: [facility];  Service: Cardiovascular;  Laterality:  Left;   PERIPHERAL VASCULAR CATHETERIZATION N/A 02/06/2016   Procedure: Abdominal Aortogram w/Lower Extremity;  Surgeon: Jackquelyn Mass, MD;  Location: [facility];  Service: Cardiovascular;  Laterality: N/A;   PERIPHERAL VASCULAR CATHETERIZATION  02/06/2016   Procedure: Lower Extremity Intervention;  Surgeon: Jackquelyn Mass, MD;  Location: [facility];  Service: Cardiovascular;;   Patient Active Problem List   Diagnosis Date Noted   Trigger finger of left hand 05/15/2023   Cervicalgia 03/21/2023   OSA on CPAP    Snoring 10/15/2022   PAD (peripheral artery disease) (HCC) 10/15/2022   Overweight 09/12/2022   Needs flu shot 09/12/2022   Type 2 myocardial infarction (HCC) 04/14/2022   Nausea, vomiting, and diarrhea 03/29/2022   Preventative health care 03/01/2022   Black stools 01/08/2022   Chronic neck pain 07/12/2021   Chronic pain of both shoulders 07/12/2021   Sensation of fullness in both ears 04/05/2021   History of cocaine use 03/02/2021   CAD in native artery 01/31/2021   S/P angioplasty with stent 01/30/21 to LAD  01/31/2021   Unstable angina (HCC) 01/30/2021   Chest pain    B12 deficiency 09/13/2020   Family history of malignant neoplasm of digestive organs 08/08/2020   Rectal bleeding 08/08/2020   Body mass index (BMI) 27.0-27.9, adult  04/07/2020   Laceration of scalp without complication 03/23/2020   Tubular adenoma of colon 11/03/2017   Type 2 diabetes mellitus (HCC) 06/18/2016   Hepatitis B immune 03/11/2016   Atherosclerosis with claudication of extremity (HCC) 01/29/2016   Arthralgia 10/26/2014   Gastroesophageal reflux disease 06/23/2013   Tobacco use disorder 04/27/2013   Erectile dysfunction 10/27/2012   Insomnia 04/15/2012   Generalized anxiety disorder 02/26/2011    Class: Acute   Suicide attempt (HCC) 02/20/2011   HIV (human immunodeficiency virus infection) (HCC) 11/01/2010   Essential (primary) hypertension 11/01/2010    Hyperlipidemia 11/01/2010    PCP: Zorita Hiss, NP  REFERRING PROVIDER: Syliva Even, MD   REFERRING DIAG:  M54.2 (ICD-10-CM) - Cervicalgia M25.511,G89.29,M25.512 (ICD-10-CM) - Chronic pain of both shoulders M54.50,G89.29 (ICD-10-CM) - Chronic bilateral low back pain without sciatica  Rationale for Evaluation and Treatment: Rehabilitation  THERAPY DIAG:  Cervicalgia  Chronic pain of both shoulders  Muscle weakness (generalized)  Other low back pain  ONSET DATE: Chronic  SUBJECTIVE:                                                                                                                                                                                           SUBJECTIVE STATEMENT: 05/20/2023 Pt presents to PT with reports of increased pain over the weekend after clearing out his grandmother's garage. Has continued HEP compliant with no adverse effect and notes he is feeling better at present.   PERTINENT HISTORY:  HTN, subdural hemorrhage   PAIN:  Are you having pain?  Yes: NPRS scale: 5/10 Worst: 10/10 Pain location: neck and UE, L>R Pain description: pain, sore, N/T Aggravating factors: driving, across body reaching, certain cervical movements Relieving factors: medication  Yes: NPRS scale: 1/10 Worst: 10/10 Pain location: lower back Pain description: sore, tight Aggravating factors: prolonged walking Relieving factors: medication  PRECAUTIONS: None  RED FLAGS: None   WEIGHT BEARING RESTRICTIONS: No  FALLS:  Has patient fallen in last 6 months? No  LIVING ENVIRONMENT: Lives with: lives with their family Lives in: House/apartment  OCCUPATION: Retired  PLOF: Independent  PATIENT GOALS: pt wants to decrease pain in neck/UE and lower back in order to improve comfort with yard work and walk his dog greater than 1/4 mile  NEXT MD VISIT: 06/05/2023  OBJECTIVE:  Note: Objective measures were completed at Evaluation unless otherwise  noted.  DIAGNOSTIC FINDINGS:  See imaging   PATIENT SURVEYS:  Modified Oswestry: 18/50 - 36% disability NDI: 14/50 - 28% disability  COGNITION: Overall cognitive status: Within functional limits for tasks assessed     SENSATION: Light touch: Impaired - L  UE  POSTURE: rounded shoulders, forward head, and increased lumbar lordosis  PALPATION: TTP to bilateral upper trap  CERVICAL ROM:   AROM eval  Flexion   Extension   Right lateral flexion   Left lateral flexion   Right rotation 45  Left rotation 38   (Blank rows = not tested)  UPPER EXTREMITY ROM:  AROM Right Eval Left Eval  Shoulder flexion 170 125  Shoulder extension    Shoulder abduction    Shoulder internal rotation    Shoulder external rotation    Elbow flexion    Elbow extension    Wrist flexion    Wrist extension    (Blank rows = not tested)  UPPER EXTREMITY MMT:  MMT Right Eval Left Eval  Shoulder flexion    Shoulder abduction    Shoulder IR 4/5 5/5  Shoulder ER 4/5 5/5  Shoulder extension    Upper trap    Middle trap    Lower trap    Elbow flexion     Elbow extension    Wrist flexion    Wrist extension    Grip 70lbs 60lbs   (Blank rows = not tested)  LUMBAR SPECIAL TESTS:  Straight leg raise test: Negative and Slump test: Negative  FUNCTIONAL TESTS:  30 Sec Sit to Stand: 10 reps no UE DNF Endurance: 8 seconds  GAIT: Distance walked: 23ft Assistive device utilized: None Level of assistance: Complete Independence Comments: trunk flexed  TREATMENT: [facility] Adult PT Treatment:                                                DATE: 05/20/23 Nu-step L5 x 5 min UE/LE for functional activity tolerance Supine chin tuck x 10 - 3" hold Supine horizontal abd 2x15 GTB Pilates bridge 3x10 Supine pilates SLR 2x10 each 90/90 hold 2x20"  S/L hip abd 2x10 Pallof press 2x10 13# FM row 2x12 20# FM extension 2x10 20# Standing ER 2x10 each Seated low row 2x10 25#  [facility] Adult PT Treatment:                                                 DATE: 05/16/23 Nu-step L5 x 5 min UE/LE for functional activity tolerance Supine chin tuck x 10 - 3" hold Supine horizontal abd 2x15 GTB Hooklying clamshell 2x15 GTB Bridge with GTB 2x10 Supine pilates SLR 2x10 each Seated bilateral ER 2x15 GTB Pallof press x 10 13# FM row 2x12 20# FM extension 2x10 20# Seated low row 2x10 25# Seated lat pulldown 2x10 35#  [facility] Adult PT Treatment:                                                DATE: 05/13/23 Therapeutic Activity: Nu-step L5 x 5 min UE/LE for functional activity tolerance Supine chin tuck x 10 - 3" hold Supine horizontal abd 2x15 GTB Therapeutic Exercise:  Seated bilateral ER 2x10 RTB Pallof press 2x10 10# Seated low row 2x10 25# Seated lat pulldown 2x10 35# Manual Therapy: Sub-occipital release Positional release bilateral upper trap Manual cervical traction Cervical side glides C5  grade II TPR to bilateral upper trap  [facility] Adult PT Treatment:                                                DATE: 05/08/23 Therapeutic Exercise: UBE lvl 1.0 x 4 min for functional activity tolerance Supine chin tuck x 10 - 3" hold Supine horizontal abd 2x15 RTB Pilates bridge 2x10 Pilates SLR 2x10  Supine PPT x 10 - 3" hold Supine PPT with ball 2x10 - 3" hold Seated bilateral ER 2x15 RTB FM row 2x10 20# Pallof press 2x10 7# each Standing chop 2x10 7# each  PATIENT EDUCATION:  Education details: HEP update Person educated: Patient Education method: Explanation, Demonstration, and Handouts Education comprehension: verbalized understanding and returned demonstration  HOME EXERCISE PROGRAM: Access Code: [email or web address] URL: [email or web address] Date: 05/20/2023 Prepared by: Loral Roch  Exercises - Seated Upper Trapezius Stretch  - 1 x daily - 7 x weekly - 2 sets - 2 reps - 30 seconds hold - Standing Shoulder Row with Anchored Resistance  - 1 x daily - 7 x weekly - 3 sets - 10 reps -  blue band hold - Supine Chin Tuck  - 1 x daily - 7 x weekly - 2 sets - 10 reps - Supine Deep Neck Flexor Training - Repetitions  - 1 x daily - 7 x weekly - 2 sets - 10 reps - 10-20 seconds hold - Shoulder External Rotation and Scapular Retraction with Resistance  - 1 x daily - 7 x weekly - 3 sets - 10 reps - red band hold - Supine Posterior Pelvic Tilt  - 1 x daily - 7 x weekly - 2 sets - 20 reps - holding 10 sec hold - Supine March with Posterior Pelvic Tilt  - 1 x daily - 7 x weekly - 2 sets - 10 reps - Seated Anterior Pelvic Tilt  - 1 x daily - 7 x weekly - 2 sets - 10 reps - 5 seconds hold - Lower Trunk Rotation  - 1 x daily - 7 x weekly - 2 sets - 10 reps - Isometric Dead Bug  - 1 x daily - 7 x weekly - 2 sets - 10 reps - holding 10 seconds hold - Standing Anti-Rotation Press with Anchored Resistance  - 1 x daily - 7 x weekly - 2 sets - 10 reps - 1 sec hold - Supine Bridge with Resistance Band  - 1 x daily - 7 x weekly - 2 sets - 10 reps - blue band hold - Sidelying Hip Abduction  - 1 x daily - 7 x weekly - 2 sets - 10 reps  ASSESSMENT:  CLINICAL IMPRESSION: 05/20/2023 Pt was able to complete all prescribed exercises with no adverse effect. Today therapy focused on exercises for decreasing pain and strengthening for DNF/periscapular muscles as well as core/hip in order to improve function. HEP updated for continued lateral hip strengthening. Pt is progressing well with therapy, will continue to progress as able.   Evaluation: Patient is a 63 y.o. M who was seen today for physical therapy evaluation and treatment for chronic neck and LBP. Physical findings are consistent with physician impression as pt demonstrates decrease in cervical ROM as well as postural and hip/core strength deficits. ODI and NDI scores suggest minimal/moderate disability in performance of home ADLs and community level activity, indicating he  is operating well below PLOF. Pt would benefit from skilled PT services working  on improving strength of postural muscles and multimodal approach of manual and exercise to decrease pain and improve function.    OBJECTIVE IMPAIRMENTS: decreased activity tolerance, decreased endurance, decreased mobility, difficulty walking, decreased ROM, decreased strength, and pain  ACTIVITY LIMITATIONS: carrying, lifting, sitting, standing, squatting, stairs, reach over head, and locomotion level  PARTICIPATION LIMITATIONS: driving, shopping, community activity, occupation, and yard work  PERSONAL FACTORS: Time since onset of injury/illness/exacerbation and 1-2 comorbidities: HTN, subdural hemorrhage  are also affecting patient's functional outcome.   REHAB POTENTIAL: Good  CLINICAL DECISION MAKING: Evolving/moderate complexity  EVALUATION COMPLEXITY: Moderate   GOALS: Goals reviewed with patient? No  SHORT TERM GOALS: Target date: 05/14/2023   Pt will be compliant and knowledgeable with initial HEP for improved comfort and carryover Baseline: initial HEP given  Goal status: INITIAL  2.  Pt will self report neck/UE and lower back pain no greater than 7/10 for improved comfort and functional ability Baseline: 10/10 at worst Goal status: INITIAL   LONG TERM GOALS: Target date: 06/18/2023   Pt will be decrease ODI disability score to no greater than 20% disability as proxy for functional improvement with home ADLs and community activites Baseline: 18/50 - 36% disability Goal status: INITIAL  2.  Pt will decrease NDI disability score to no greater than 20% disability as proxy for functional improvement with ADLs and community activities Baseline: 28% disability Goal status: INITIAL   3.  Pt will increase 30 Second Sit to Stand rep count to no less than 12 reps for improved balance, strength, and functional mobility Baseline: 10 reps  Goal status: INITIAL   4.  Pt will improve walking distance to at least 1 mile while walking his dog with decreased LBP for improved comfort  with desired recreational activity Baseline: 1/4 mile Goal status: INITIAL  5.  Pt will improve bilateral cervical rotation to no less than 60 degrees for improved comfort and functional ability with driving Baseline: see ROM chart Goal status: INITIAL  6.  Pt will improve L grip strength to at least 70lbs for improved functional ability with grasp and ADL tasks Baseline: see ROM chart Goal status: INITIAL  PLAN:  PT FREQUENCY: 2x/week  PT DURATION: 8 weeks  PLANNED INTERVENTIONS: 97164- PT Re-evaluation, 97110-Therapeutic exercises, 97530- Therapeutic activity, [id number]- Neuromuscular re-education, 97535- Self Care, [id number]- Manual therapy, [id number]- Gait training, [id number]- Aquatic Therapy, [id number]- Electrical stimulation (unattended), [id number]- Electrical stimulation (manual), 97016- Vasopneumatic device, Dry Needling, Cryotherapy, and Moist heat.  PLAN FOR NEXT SESSION: progressive HEP PRN , periscapular/DNF, hip/core strength.   Ivor Mars PT  05/20/23 10:14 AM

## 2023-05-22 ENCOUNTER — Other Ambulatory Visit: Payer: Self-pay

## 2023-05-22 ENCOUNTER — Ambulatory Visit: Payer: 59 | Admitting: Nurse Practitioner

## 2023-05-22 ENCOUNTER — Ambulatory Visit

## 2023-05-22 DIAGNOSIS — R2689 Other abnormalities of gait and mobility: Secondary | ICD-10-CM

## 2023-05-22 DIAGNOSIS — M542 Cervicalgia: Secondary | ICD-10-CM

## 2023-05-22 DIAGNOSIS — M5459 Other low back pain: Secondary | ICD-10-CM

## 2023-05-22 DIAGNOSIS — M6281 Muscle weakness (generalized): Secondary | ICD-10-CM

## 2023-05-22 DIAGNOSIS — E1165 Type 2 diabetes mellitus with hyperglycemia: Secondary | ICD-10-CM

## 2023-05-22 DIAGNOSIS — G8929 Other chronic pain: Secondary | ICD-10-CM

## 2023-05-22 NOTE — Therapy (Signed)
 OUTPATIENT PHYSICAL THERAPY TREATMENT   Patient Name: Jacob Ali MRN: [id number] DOB:[birth year], 63 y.o., male Today's Date: 05/22/2023  END OF SESSION:  PT End of Session - 05/22/23 0918     Visit Number 9    Number of Visits 17    Date for PT Re-Evaluation 06/18/23    Authorization Type [employer] MCD    PT Start Time 0930    PT Stop Time 1010    PT Time Calculation (min) 40 min    Activity Tolerance Patient tolerated treatment well    Behavior During Therapy [facility] for tasks assessed/performed                   Past Medical History:  Diagnosis Date   CAD in native artery 01/31/2021   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   Subdural hemorrhage (HCC)    Tubular adenoma of colon 11/03/2017   Colonoscopy Sept 9, 2019   Past Surgical History:  Procedure Laterality Date   CARDIAC CATHETERIZATION     CORONARY STENT INTERVENTION N/A 01/30/2021   Procedure: CORONARY STENT INTERVENTION;  Surgeon: Arty Binning, MD;  Location: [facility] IN[facility];  Service: Cardiovascular;  Laterality: N/A;   Examination under anesthesia, repair of anal fissure,  12/04/2000   LEFT HEART CATH AND CORONARY ANGIOGRAPHY N/A 01/30/2021   Procedure: LEFT HEART CATH AND CORONARY ANGIOGRAPHY;  Surgeon: Arty Binning, MD;  Location: [facility] INVASIVE CV [facility];  Service: Cardiovascular;  Laterality: N/A;   PERIPHERAL VASCULAR CATHETERIZATION Left 02/06/2016   Procedure: Lower Extremity Angiography;  Surgeon: Jackquelyn Mass, MD;  Location: [facility] INVASIVE CV LAB;  Service: Cardiovascular;   Laterality: Left;   PERIPHERAL VASCULAR CATHETERIZATION N/A 02/06/2016   Procedure: Abdominal Aortogram w/Lower Extremity;  Surgeon: Jackquelyn Mass, MD;  Location: [facility] INVASIVE CV LAB;  Service: Cardiovascular;  Laterality: N/A;   PERIPHERAL VASCULAR CATHETERIZATION  02/06/2016   Procedure: Lower Extremity Intervention;  Surgeon: Jackquelyn Mass, MD;  Location: [facility] INVASIVE CV LAB;  Service: Cardiovascular;;   Patient Active Problem List   Diagnosis Date Noted   Trigger finger of left hand 05/15/2023   Cervicalgia 03/21/2023   OSA on CPAP    Snoring 10/15/2022   PAD (peripheral artery disease) (HCC) 10/15/2022   Overweight 09/12/2022   Needs flu shot 09/12/2022   Type 2 myocardial infarction (HCC) 04/14/2022   Nausea, vomiting, and diarrhea 03/29/2022   Preventative health care 03/01/2022   Black stools 01/08/2022   Chronic neck pain 07/12/2021   Chronic pain of both shoulders 07/12/2021   Sensation of fullness in both ears 04/05/2021   History of cocaine use 03/02/2021   CAD in native artery 01/31/2021   S/P angioplasty with stent 01/30/21 to LAD  01/31/2021   Unstable angina (HCC) 01/30/2021   Chest pain    B12 deficiency 09/13/2020   Family history of malignant neoplasm of digestive organs 08/08/2020   Rectal bleeding 08/08/2020   Body mass index (BMI) 27.0-27.9,  adult 04/07/2020   Laceration of scalp without complication 03/23/2020   Tubular adenoma of colon 11/03/2017   Type 2 diabetes mellitus (HCC) 06/18/2016   Hepatitis B immune 03/11/2016   Atherosclerosis with claudication of extremity (HCC) 01/29/2016   Arthralgia 10/26/2014   Gastroesophageal reflux disease 06/23/2013   Tobacco use disorder 04/27/2013   Erectile dysfunction 10/27/2012   Insomnia 04/15/2012   Generalized anxiety disorder 02/26/2011    Class: Acute   Suicide attempt (HCC) 02/20/2011   HIV (human immunodeficiency virus infection) (HCC) 11/01/2010   Essential (primary) hypertension  11/01/2010   Hyperlipidemia 11/01/2010    PCP: Zorita Hiss, NP  REFERRING PROVIDER: Syliva Even, MD   REFERRING DIAG:  M54.2 (ICD-10-CM) - Cervicalgia M25.511,G89.29,M25.512 (ICD-10-CM) - Chronic pain of both shoulders M54.50,G89.29 (ICD-10-CM) - Chronic bilateral low back pain without sciatica  Rationale for Evaluation and Treatment: Rehabilitation  THERAPY DIAG:  Cervicalgia  Chronic pain of both shoulders  Muscle weakness (generalized)  Other low back pain  Other abnormalities of gait and mobility  ONSET DATE: Chronic  SUBJECTIVE:                                                                                                                                                                                           SUBJECTIVE STATEMENT: 05/22/2023 Pt presents to PT with continued improvement in neck and lower back pain. Has continued HEP compliance with no adverse effect.    PERTINENT HISTORY:  HTN, subdural hemorrhage   PAIN:  Are you having pain?  Yes: NPRS scale: 1/10 Worst: 10/10 Pain location: neck and UE, L>R Pain description: pain, sore, N/T Aggravating factors: driving, across body reaching, certain cervical movements Relieving factors: medication  Yes: NPRS scale: 1/10 Worst: 10/10 Pain location: lower back Pain description: sore, tight Aggravating factors: prolonged walking Relieving factors: medication  PRECAUTIONS: None  RED FLAGS: None   WEIGHT BEARING RESTRICTIONS: No  FALLS:  Has patient fallen in last 6 months? No  LIVING ENVIRONMENT: Lives with: lives with their family Lives in: House/apartment  OCCUPATION: Retired  PLOF: Independent  PATIENT GOALS: pt wants to decrease pain in neck/UE and lower back in order to improve comfort with yard work and walk his dog greater than 1/4 mile  NEXT MD VISIT: 06/05/2023  OBJECTIVE:  Note: Objective measures were completed at Evaluation unless otherwise noted.  DIAGNOSTIC FINDINGS:   See imaging   PATIENT SURVEYS:  Modified Oswestry: 18/50 - 36% disability NDI: 14/50 - 28% disability  COGNITION: Overall cognitive status: Within functional limits for tasks assessed     SENSATION: Light touch: Impaired - L UE  POSTURE: rounded  shoulders, forward head, and increased lumbar lordosis  PALPATION: TTP to bilateral upper trap  CERVICAL ROM:   AROM eval  Flexion   Extension   Right lateral flexion   Left lateral flexion   Right rotation 45  Left rotation 38   (Blank rows = not tested)  UPPER EXTREMITY ROM:  AROM Right Eval Left Eval  Shoulder flexion 170 125  Shoulder extension    Shoulder abduction    Shoulder internal rotation    Shoulder external rotation    Elbow flexion    Elbow extension    Wrist flexion    Wrist extension    (Blank rows = not tested)  UPPER EXTREMITY MMT:  MMT Right Eval Left Eval  Shoulder flexion    Shoulder abduction    Shoulder IR 4/5 5/5  Shoulder ER 4/5 5/5  Shoulder extension    Upper trap    Middle trap    Lower trap    Elbow flexion     Elbow extension    Wrist flexion    Wrist extension    Grip 70lbs 60lbs   (Blank rows = not tested)  LUMBAR SPECIAL TESTS:  Straight leg raise test: Negative and Slump test: Negative  FUNCTIONAL TESTS:  30 Sec Sit to Stand: 10 reps no UE DNF Endurance: 8 seconds  GAIT: Distance walked: 32ft Assistive device utilized: None Level of assistance: Complete Independence Comments: trunk flexed  TREATMENT: [facility] Adult PT Treatment:                                                DATE: 05/22/23 Nu-step L5 x 4 min UE/LE for functional activity tolerance Supine chin tuck x 10 - 3" hold Supine horizontal abd 2x15 GTB Pilates bridge 3x10 Supine pilates SLR 2x10 each 90/90 hold 2x20"  90/90 alt taps 2x10 Standing ER with chin tuck against wall 2x10 Pallof press 2x10 13#  [facility] Adult PT Treatment:                                                DATE: 05/20/23 Nu-step L5 x  5 min UE/LE for functional activity tolerance Supine chin tuck x 10 - 3" hold Supine horizontal abd 2x15 GTB Pilates bridge 3x10 Supine pilates SLR 2x10 each 90/90 hold 2x20"  S/L hip abd 2x10 Pallof press 2x10 13# FM row 2x12 20# FM extension 2x10 20# Standing ER 2x10 each Seated low row 2x10 25#  [facility] Adult PT Treatment:                                                DATE: 05/16/23 Nu-step L5 x 5 min UE/LE for functional activity tolerance Supine chin tuck x 10 - 3" hold Supine horizontal abd 2x15 GTB Hooklying clamshell 2x15 GTB Bridge with GTB 2x10 Supine pilates SLR 2x10 each Seated bilateral ER 2x15 GTB Pallof press x 10 13# FM row 2x12 20# FM extension 2x10 20# Seated low row 2x10 25# Seated lat pulldown 2x10 35#  [facility] Adult PT Treatment:  DATE: 05/13/23 Therapeutic Activity: Nu-step L5 x 5 min UE/LE for functional activity tolerance Supine chin tuck x 10 - 3" hold Supine horizontal abd 2x15 GTB Therapeutic Exercise:  Seated bilateral ER 2x10 RTB Pallof press 2x10 10# Seated low row 2x10 25# Seated lat pulldown 2x10 35# Manual Therapy: Sub-occipital release Positional release bilateral upper trap Manual cervical traction Cervical side glides C5 grade II TPR to bilateral upper trap  [facility] Adult PT Treatment:                                                DATE: 05/08/23 Therapeutic Exercise: UBE lvl 1.0 x 4 min for functional activity tolerance Supine chin tuck x 10 - 3" hold Supine horizontal abd 2x15 RTB Pilates bridge 2x10 Pilates SLR 2x10  Supine PPT x 10 - 3" hold Supine PPT with ball 2x10 - 3" hold Seated bilateral ER 2x15 RTB FM row 2x10 20# Pallof press 2x10 7# each Standing chop 2x10 7# each  PATIENT EDUCATION:  Education details: HEP update Person educated: Patient Education method: Explanation, Demonstration, and Handouts Education comprehension: verbalized understanding and returned  demonstration  HOME EXERCISE PROGRAM: Access Code: [email or web address] URL: [email or web address] Date: 05/20/2023 Prepared by: Loral Roch  Exercises - Seated Upper Trapezius Stretch  - 1 x daily - 7 x weekly - 2 sets - 2 reps - 30 seconds hold - Standing Shoulder Row with Anchored Resistance  - 1 x daily - 7 x weekly - 3 sets - 10 reps - blue band hold - Supine Chin Tuck  - 1 x daily - 7 x weekly - 2 sets - 10 reps - Supine Deep Neck Flexor Training - Repetitions  - 1 x daily - 7 x weekly - 2 sets - 10 reps - 10-20 seconds hold - Shoulder External Rotation and Scapular Retraction with Resistance  - 1 x daily - 7 x weekly - 3 sets - 10 reps - red band hold - Supine Posterior Pelvic Tilt  - 1 x daily - 7 x weekly - 2 sets - 20 reps - holding 10 sec hold - Supine March with Posterior Pelvic Tilt  - 1 x daily - 7 x weekly - 2 sets - 10 reps - Seated Anterior Pelvic Tilt  - 1 x daily - 7 x weekly - 2 sets - 10 reps - 5 seconds hold - Lower Trunk Rotation  - 1 x daily - 7 x weekly - 2 sets - 10 reps - Isometric Dead Bug  - 1 x daily - 7 x weekly - 2 sets - 10 reps - holding 10 seconds hold - Standing Anti-Rotation Press with Anchored Resistance  - 1 x daily - 7 x weekly - 2 sets - 10 reps - 1 sec hold - Supine Bridge with Resistance Band  - 1 x daily - 7 x weekly - 2 sets - 10 reps - blue band hold - Sidelying Hip Abduction  - 1 x daily - 7 x weekly - 2 sets - 10 reps  ASSESSMENT:  CLINICAL IMPRESSION: 05/22/2023 Pt was able to complete all prescribed exercises with no adverse effect. Today therapy focused on exercises for decreasing pain and strengthening for DNF/periscapular muscles as well as core/hip in order to improve function. No changes to HEP today. Pt is progressing well with therapy, will continue to progress as able.  Evaluation: Patient is a 63 y.o. M who was seen today for physical therapy evaluation and treatment for chronic neck and LBP. Physical findings are consistent  with physician impression as pt demonstrates decrease in cervical ROM as well as postural and hip/core strength deficits. ODI and NDI scores suggest minimal/moderate disability in performance of home ADLs and community level activity, indicating he is operating well below PLOF. Pt would benefit from skilled PT services working on improving strength of postural muscles and multimodal approach of manual and exercise to decrease pain and improve function.    OBJECTIVE IMPAIRMENTS: decreased activity tolerance, decreased endurance, decreased mobility, difficulty walking, decreased ROM, decreased strength, and pain  ACTIVITY LIMITATIONS: carrying, lifting, sitting, standing, squatting, stairs, reach over head, and locomotion level  PARTICIPATION LIMITATIONS: driving, shopping, community activity, occupation, and yard work  PERSONAL FACTORS: Time since onset of injury/illness/exacerbation and 1-2 comorbidities: HTN, subdural hemorrhage are also affecting patient's functional outcome.   REHAB POTENTIAL: Good  CLINICAL DECISION MAKING: Evolving/moderate complexity  EVALUATION COMPLEXITY: Moderate   GOALS: Goals reviewed with patient? No  SHORT TERM GOALS: Target date: 05/14/2023   Pt will be compliant and knowledgeable with initial HEP for improved comfort and carryover Baseline: initial HEP given  Goal status: MET  2.  Pt will self report neck/UE and lower back pain no greater than 7/10 for improved comfort and functional ability Baseline: 10/10 at worst 05/22/2023: 9/10 at worst Goal status: MET   LONG TERM GOALS: Target date: 06/18/2023   Pt will be decrease ODI disability score to no greater than 20% disability as proxy for functional improvement with home ADLs and community activites Baseline: 18/50 - 36% disability Goal status: INITIAL  2.  Pt will decrease NDI disability score to no greater than 20% disability as proxy for functional improvement with ADLs and community  activities Baseline: 28% disability Goal status: INITIAL   3.  Pt will increase 30 Second Sit to Stand rep count to no less than 12 reps for improved balance, strength, and functional mobility Baseline: 10 reps  Goal status: INITIAL   4.  Pt will improve walking distance to at least 1 mile while walking his dog with decreased LBP for improved comfort with desired recreational activity Baseline: 1/4 mile Goal status: INITIAL  5.  Pt will improve bilateral cervical rotation to no less than 60 degrees for improved comfort and functional ability with driving Baseline: see ROM chart Goal status: INITIAL  6.  Pt will improve L grip strength to at least 70lbs for improved functional ability with grasp and ADL tasks Baseline: see ROM chart Goal status: INITIAL  PLAN:  PT FREQUENCY: 2x/week  PT DURATION: 8 weeks  PLANNED INTERVENTIONS: 97164- PT Re-evaluation, 97110-Therapeutic exercises, 97530- Therapeutic activity, [id number]- Neuromuscular re-education, 97535- Self Care, [id number]- Manual therapy, [id number]- Gait training, [id number]- Aquatic Therapy, [id number]- Electrical stimulation (unattended), [id number]- Electrical stimulation (manual), 97016- Vasopneumatic device, Dry Needling, Cryotherapy, and Moist heat.  PLAN FOR NEXT SESSION: progressive HEP PRN , periscapular/DNF, hip/core strength.   Ivor Mars PT  05/22/23 10:18 AM

## 2023-05-23 ENCOUNTER — Other Ambulatory Visit: Payer: Self-pay | Admitting: Nurse Practitioner

## 2023-05-23 DIAGNOSIS — E1165 Type 2 diabetes mellitus with hyperglycemia: Secondary | ICD-10-CM

## 2023-05-23 MED ORDER — EMPAGLIFLOZIN 10 MG PO TABS
10.0000 mg | ORAL_TABLET | Freq: Every day | ORAL | 1 refills | Status: DC
Start: 2023-05-23 — End: 2023-10-14

## 2023-05-28 ENCOUNTER — Encounter: Payer: Self-pay | Admitting: Physical Therapy

## 2023-05-28 ENCOUNTER — Ambulatory Visit: Admitting: Physical Therapy

## 2023-05-28 DIAGNOSIS — M6281 Muscle weakness (generalized): Secondary | ICD-10-CM

## 2023-05-28 DIAGNOSIS — M542 Cervicalgia: Secondary | ICD-10-CM

## 2023-05-28 DIAGNOSIS — G8929 Other chronic pain: Secondary | ICD-10-CM

## 2023-05-28 NOTE — Therapy (Signed)
 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   Subdural hemorrhage (HCC)    Tubular adenoma of colon 11/03/2017   Colonoscopy Sept 9, 2019   Past Surgical History:  Procedure Laterality Date   CARDIAC CATHETERIZATION     CORONARY STENT INTERVENTION N/A 01/30/2021   Procedure: CORONARY STENT INTERVENTION;  Surgeon: Arty Binning, MD;  Location: [facility];  Service: Cardiovascular;  Laterality: N/A;   Examination under anesthesia, repair of anal fissure,  12/04/2000   LEFT HEART CATH AND CORONARY ANGIOGRAPHY N/A 01/30/2021   Procedure: LEFT HEART CATH AND CORONARY ANGIOGRAPHY;  Surgeon: Arty Binning, MD;  Location: [facility];  Service: Cardiovascular;  Laterality: N/A;   PERIPHERAL VASCULAR CATHETERIZATION Left 02/06/2016   Procedure: Lower Extremity Angiography;  Surgeon: Jackquelyn Mass, MD;  Location: [facility];  Service: Cardiovascular;  Laterality: Left;   PERIPHERAL VASCULAR CATHETERIZATION N/A  02/06/2016   Procedure: Abdominal Aortogram w/Lower Extremity;  Surgeon: Jackquelyn Mass, MD;  Location: [facility] IN[facility];  Service: Cardiovascular;  Laterality: N/A;   PERIPHERAL VASCULAR CATHETERIZATION  02/06/2016   Procedure: Lower Extremity Intervention;  Surgeon: Jackquelyn Mass, MD;  Location: [facility] INV[facility] CV [facility];  Service: Cardiovascular;;   Patient Active Problem List   Diagnosis Date Noted   Trigger finger of left hand 05/15/2023   Cervicalgia 03/21/2023   OSA on CPAP    Snoring 10/15/2022   PAD (peripheral artery disease) (HCC) 10/15/2022   Overweight 09/12/2022   Needs flu shot 09/12/2022   Type 2 myocardial infarction (HCC) 04/14/2022   Nausea, vomiting, and diarrhea 03/29/2022   Preventative health care 03/01/2022   Black stools 01/08/2022   Chronic neck pain 07/12/2021   Chronic pain of both shoulders 07/12/2021   Sensation of fullness in both ears 04/05/2021   History of cocaine use 03/02/2021   CAD in native artery 01/31/2021   S/P angioplasty with stent 01/30/21 to LAD  01/31/2021   Unstable angina (HCC) 01/30/2021   Chest pain    B12 deficiency 09/13/2020   Family history of malignant neoplasm of digestive organs 08/08/2020   Rectal bleeding 08/08/2020   Body mass index (BMI) 27.0-27.9, adult 04/07/2020   Laceration of scalp without complication 03/23/2020   Tubular adenoma of colon  11/03/2017   Type 2 diabetes mellitus (HCC) 06/18/2016   Hepatitis B immune 03/11/2016   Atherosclerosis with claudication of extremity (HCC) 01/29/2016   Arthralgia 10/26/2014   Gastroesophageal reflux disease 06/23/2013   Tobacco use disorder 04/27/2013   Erectile dysfunction 10/27/2012   Insomnia 04/15/2012   Generalized anxiety disorder 02/26/2011    Class: Acute   Suicide attempt (HCC) 02/20/2011   HIV (human immunodeficiency virus infection) (HCC) 11/01/2010   Essential (primary) hypertension 11/01/2010   Hyperlipidemia 11/01/2010    PCP: Zorita Hiss,  NP  REFERRING PROVIDER: Syliva Even, MD   REFERRING DIAG:  M54.2 (ICD-10-CM) - Cervicalgia M25.511,G89.29,M25.512 (ICD-10-CM) - Chronic pain of both shoulders M54.50,G89.29 (ICD-10-CM) - Chronic bilateral low back pain without sciatica  Rationale for Evaluation and Treatment: Rehabilitation  THERAPY DIAG:  Cervicalgia  Chronic pain of both shoulders  Muscle weakness (generalized)  ONSET DATE: Chronic  SUBJECTIVE:                                                                                                                                                                                           SUBJECTIVE STATEMENT: 05/28/2023 "I am doing pretty good no pain today. I did get some pain int he L shoulder which I'm not quite sure what happened but I ended up working out."  PERTINENT HISTORY:  HTN, subdural hemorrhage   PAIN:  Are you having pain?  Yes: NPRS scale: 0/10 Worst: 10/10 Pain location: neck and UE, L>R Pain description: pain, sore, N/T Aggravating factors: driving, across body reaching, certain cervical movements Relieving factors: medication  Yes: NPRS scale: 0/10 Worst: 10/10 Pain location: lower back Pain description: sore, tight Aggravating factors: prolonged walking Relieving factors: medication  PRECAUTIONS: None  RED FLAGS: None   WEIGHT BEARING RESTRICTIONS: No  FALLS:  Has patient fallen in last 6 months? No  LIVING ENVIRONMENT: Lives with: lives with their family Lives in: House/apartment  OCCUPATION: Retired  PLOF: Independent  PATIENT GOALS: pt wants to decrease pain in neck/UE and lower back in order to improve comfort with yard work and walk his dog greater than 1/4 mile  NEXT MD VISIT: 06/05/2023  OBJECTIVE:  Note: Objective measures were completed at Evaluation unless otherwise noted.  DIAGNOSTIC FINDINGS:  See imaging   PATIENT SURVEYS:  Modified Oswestry: 18/50 - 36% disability NDI: 14/50 - 28%  disability  COGNITION: Overall cognitive status: Within functional limits for tasks assessed     SENSATION: Light touch: Impaired - L UE  POSTURE: rounded shoulders, forward head, and increased lumbar lordosis  PALPATION: TTP to bilateral upper trap  CERVICAL ROM:   AROM eval  05/28/23  Flexion    Extension    Right lateral flexion    Left lateral flexion    Right rotation 45 61  Left rotation 38 61   (Blank rows = not tested)  UPPER EXTREMITY ROM:  AROM Right Eval Left Eval Right 05/28/23 Left 05/28/23  Shoulder flexion 170 125 169 165  Shoulder extension      Shoulder abduction      Shoulder internal rotation      Shoulder external rotation      Elbow flexion      Elbow extension      Wrist flexion      Wrist extension      (Blank rows = not tested)  UPPER EXTREMITY MMT:  MMT Right Eval Left Eval  Shoulder flexion    Shoulder abduction    Shoulder IR 4/5 5/5  Shoulder ER 4/5 5/5  Shoulder extension    Upper trap    Middle trap    Lower trap    Elbow flexion     Elbow extension    Wrist flexion    Wrist extension    Grip 70lbs 60lbs   (Blank rows = not tested)  LUMBAR SPECIAL TESTS:  Straight leg raise test: Negative and Slump test: Negative  FUNCTIONAL TESTS:  30 Sec Sit to Stand: 10 reps no UE DNF Endurance: 8 seconds  GAIT: Distance walked: 74ft Assistive device utilized: None Level of assistance: Complete Independence Comments: trunk flexed  TREATMENT: [facility] Adult PT Treatment:                                                DATE: 05/28/23 MTPR along the bil glute med x 2 Nu-step L5 x 5 in LE only Sustained chin tuck against the wall while doing horizontal abduction and diagonals with RTB Standing on LLE with contralateral Heel strike/ toe off 2 x 15, repeated on the RLE Sit to stand with RTB around knees 2 x 10 Updated HEP today  [facility] Adult PT Treatment:                                                DATE: 05/22/23 Nu-step L5 x 4 min  UE/LE for functional activity tolerance Supine chin tuck x 10 - 3" hold Supine horizontal abd 2x15 GTB Pilates bridge 3x10 Supine pilates SLR 2x10 each 90/90 hold 2x20"  90/90 alt taps 2x10 Standing ER with chin tuck against wall 2x10 Pallof press 2x10 13#  [facility] Adult PT Treatment:                                                DATE: 05/20/23 Nu-step L5 x 5 min UE/LE for functional activity tolerance Supine chin tuck x 10 - 3" hold Supine horizontal abd 2x15 GTB Pilates bridge 3x10 Supine pilates SLR 2x10 each 90/90 hold 2x20"  S/L hip abd 2x10 Pallof press 2x10 13# FM row 2x12 20# FM extension 2x10 20# Standing ER 2x10 each Seated low row 2x10 25#   PATIENT EDUCATION:  Education details: HEP update Person educated: Patient Education method:  Explanation, Demonstration, and Handouts Education comprehension: verbalized understanding and returned demonstration  HOME EXERCISE PROGRAM: Access Code: [email or web address] URL: [email or web address] Date: 05/28/2023 Prepared by: Laron Plummer  Exercises - Seated Upper Trapezius Stretch  - 1 x daily - 7 x weekly - 2 sets - 2 reps - 30 seconds hold - Standing Shoulder Row with Anchored Resistance  - 1 x daily - 7 x weekly - 3 sets - 10 reps - blue band hold - Supine Chin Tuck  - 1 x daily - 7 x weekly - 2 sets - 10 reps - Supine Deep Neck Flexor Training - Repetitions  - 1 x daily - 7 x weekly - 2 sets - 10 reps - 10-20 seconds hold - Shoulder External Rotation and Scapular Retraction with Resistance  - 1 x daily - 7 x weekly - 3 sets - 10 reps - red band hold - Supine Posterior Pelvic Tilt  - 1 x daily - 7 x weekly - 2 sets - 20 reps - holding 10 sec hold - Supine March with Posterior Pelvic Tilt  - 1 x daily - 7 x weekly - 2 sets - 10 reps - Seated Anterior Pelvic Tilt  - 1 x daily - 7 x weekly - 2 sets - 10 reps - 5 seconds hold - Lower Trunk Rotation  - 1 x daily - 7 x weekly - 2 sets - 10 reps - Isometric Dead Bug  - 1 x  daily - 7 x weekly - 2 sets - 10 reps - holding 10 seconds hold - Standing Anti-Rotation Press with Anchored Resistance  - 1 x daily - 7 x weekly - 2 sets - 10 reps - 1 sec hold - Supine Bridge with Resistance Band  - 1 x daily - 7 x weekly - 2 sets - 10 reps - blue band hold - Sidelying Hip Abduction  - 1 x daily - 7 x weekly - 2 sets - 10 reps - Sit to Stand with Resistance Around Legs  - 1 x daily - 7 x weekly - 2 sets - 10 reps  ASSESSMENT:  CLINICAL IMPRESSION: 05/28/2023 Mr Button reports to PT today noting improvement in the neck/ shoulders but noting soreness in the side of the hips with standing/ walking. STW performed on bil glute meds followed with UE/ LE strengthening. He did well with exercise but does fatigue quickly. End of session he noted feeling good. He is making good progress with PT demonstrating improvement in neck/ shoulder mobility.   Evaluation: Patient is a 63 y.o. M who was seen today for physical therapy evaluation and treatment for chronic neck and LBP. Physical findings are consistent with physician impression as pt demonstrates decrease in cervical ROM as well as postural and hip/core strength deficits. ODI and NDI scores suggest minimal/moderate disability in performance of home ADLs and community level activity, indicating he is operating well below PLOF. Pt would benefit from skilled PT services working on improving strength of postural muscles and multimodal approach of manual and exercise to decrease pain and improve function.    OBJECTIVE IMPAIRMENTS: decreased activity tolerance, decreased endurance, decreased mobility, difficulty walking, decreased ROM, decreased strength, and pain  ACTIVITY LIMITATIONS: carrying, lifting, sitting, standing, squatting, stairs, reach over head, and locomotion level  PARTICIPATION LIMITATIONS: driving, shopping, community activity, occupation, and yard work  PERSONAL FACTORS: Time since onset of injury/illness/exacerbation and  1-2 comorbidities: HTN, subdural hemorrhage are also affecting patient's functional outcome.   REHAB POTENTIAL: Good  CLINICAL  DECISION MAKING: Evolving/moderate complexity  EVALUATION COMPLEXITY: Moderate   GOALS: Goals reviewed with patient? No  SHORT TERM GOALS: Target date: 05/14/2023   Pt will be compliant and knowledgeable with initial HEP for improved comfort and carryover Baseline: initial HEP given  Goal status: MET  2.  Pt will self report neck/UE and lower back pain no greater than 7/10 for improved comfort and functional ability Baseline: 10/10 at worst 05/22/2023: 9/10 at worst Goal status: MET   LONG TERM GOALS: Target date: 06/18/2023   Pt will be decrease ODI disability score to no greater than 20% disability as proxy for functional improvement with home ADLs and community activites Baseline: 18/50 - 36% disability Goal status: INITIAL  2.  Pt will decrease NDI disability score to no greater than 20% disability as proxy for functional improvement with ADLs and community activities Baseline: 28% disability Goal status: INITIAL   3.  Pt will increase 30 Second Sit to Stand rep count to no less than 12 reps for improved balance, strength, and functional mobility Baseline: 10 reps  Goal status: INITIAL   4.  Pt will improve walking distance to at least 1 mile while walking his dog with decreased LBP for improved comfort with desired recreational activity Baseline: 1/4 mile Goal status: INITIAL  5.  Pt will improve bilateral cervical rotation to no less than 60 degrees for improved comfort and functional ability with driving Baseline: see ROM chart Goal status: INITIAL  6.  Pt will improve L grip strength to at least 70lbs for improved functional ability with grasp and ADL tasks Baseline: see ROM chart Goal status: INITIAL  PLAN:  PT FREQUENCY: 2x/week  PT DURATION: 8 weeks  PLANNED INTERVENTIONS: 97164- PT Re-evaluation, 97110-Therapeutic exercises,  97530- Therapeutic activity, [id number]- Neuromuscular re-education, 97535- Self Care, 16109- Manual therapy, [id number]- Gait training, [id number]- Aquatic Therapy, [id number]- Electrical stimulation (unattended), [id number]- Electrical stimulation (manual), 97016- Vasopneumatic device, Dry Needling, Cryotherapy, and Moist heat.  PLAN FOR NEXT SESSION: progressive HEP PRN , periscapular/DNF, hip/core strength.   Shemika Robbs PT, DPT, LAT, ATC  05/28/23  11:57 AM

## 2023-05-30 ENCOUNTER — Ambulatory Visit

## 2023-05-30 DIAGNOSIS — G8929 Other chronic pain: Secondary | ICD-10-CM

## 2023-05-30 DIAGNOSIS — M6281 Muscle weakness (generalized): Secondary | ICD-10-CM

## 2023-05-30 DIAGNOSIS — M542 Cervicalgia: Secondary | ICD-10-CM

## 2023-05-30 NOTE — Therapy (Signed)
 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   Subdural hemorrhage (HCC)    Tubular adenoma of colon 11/03/2017   Colonoscopy Sept 9, 2019   Past Surgical History:  Procedure Laterality Date   CARDIAC CATHETERIZATION     CORONARY STENT INTERVENTION N/A 01/30/2021   Procedure: CORONARY STENT INTERVENTION;  Surgeon: Arty Binning, MD;  Location: [facility];  Service: Cardiovascular;  Laterality: N/A;   Examination under anesthesia, repair of anal fissure,  12/04/2000   LEFT HEART CATH AND CORONARY ANGIOGRAPHY N/A 01/30/2021   Procedure: LEFT HEART CATH AND CORONARY ANGIOGRAPHY;  Surgeon: Arty Binning, MD;  Location: [facility] INV[facility] CV [facility];  Service: Cardiovascular;  Laterality: N/A;   PERIPHERAL VASCULAR CATHETERIZATION Left 02/06/2016   Procedure: Lower Extremity Angiography;  Surgeon: Jackquelyn Mass, MD;  Location: [facility] INVASIVE CV LAB;  Service: Cardiovascular;  Laterality: Left;   PERIPHERAL VASCULAR CATHETERIZATION N/A  02/06/2016   Procedure: Abdominal Aortogram w/Lower Extremity;  Surgeon: Jackquelyn Mass, MD;  Location: [facility] INVASIVE CV LAB;  Service: Cardiovascular;  Laterality: N/A;   PERIPHERAL VASCULAR CATHETERIZATION  02/06/2016   Procedure: Lower Extremity Intervention;  Surgeon: Jackquelyn Mass, MD;  Location: [facility] INVASIVE CV LAB;  Service: Cardiovascular;;   Patient Active Problem List   Diagnosis Date Noted   Trigger finger of left hand 05/15/2023   Cervicalgia 03/21/2023   OSA on CPAP    Snoring 10/15/2022   PAD (peripheral artery disease) (HCC) 10/15/2022   Overweight 09/12/2022   Needs flu shot 09/12/2022   Type 2 myocardial infarction (HCC) 04/14/2022   Nausea, vomiting, and diarrhea 03/29/2022   Preventative health care 03/01/2022   Black stools 01/08/2022   Chronic neck pain 07/12/2021   Chronic pain of both shoulders 07/12/2021   Sensation of fullness in both ears 04/05/2021   History of cocaine use 03/02/2021   CAD in native artery 01/31/2021   S/P angioplasty with stent 01/30/21 to LAD  01/31/2021   Unstable angina (HCC) 01/30/2021   Chest pain    B12 deficiency 09/13/2020   Family history of malignant neoplasm of digestive organs 08/08/2020   Rectal bleeding 08/08/2020   Body mass index (BMI) 27.0-27.9, adult 04/07/2020   Laceration of scalp without complication 03/23/2020   Tubular adenoma of  colon 11/03/2017   Type 2 diabetes mellitus (HCC) 06/18/2016   Hepatitis B immune 03/11/2016   Atherosclerosis with claudication of extremity (HCC) 01/29/2016   Arthralgia 10/26/2014   Gastroesophageal reflux disease 06/23/2013   Tobacco use disorder 04/27/2013   Erectile dysfunction 10/27/2012   Insomnia 04/15/2012   Generalized anxiety disorder 02/26/2011    Class: Acute   Suicide attempt (HCC) 02/20/2011   HIV (human immunodeficiency virus infection) (HCC) 11/01/2010   Essential (primary) hypertension 11/01/2010   Hyperlipidemia 11/01/2010    PCP: Zorita Hiss,  NP  REFERRING PROVIDER: Syliva Even, MD   REFERRING DIAG:  M54.2 (ICD-10-CM) - Cervicalgia M25.511,G89.29,M25.512 (ICD-10-CM) - Chronic pain of both shoulders M54.50,G89.29 (ICD-10-CM) - Chronic bilateral low back pain without sciatica  Rationale for Evaluation and Treatment: Rehabilitation  THERAPY DIAG:  Cervicalgia  Chronic pain of both shoulders  Muscle weakness (generalized)  ONSET DATE: Chronic  SUBJECTIVE:                                                                                                                                                                                           SUBJECTIVE STATEMENT: Pt presents to PT with reports of continued improvement in status. Pt continues to be compliant with HEP with no adverse effect.   PERTINENT HISTORY:  HTN, subdural hemorrhage   PAIN:  Are you having pain?  Yes: NPRS scale: 0/10 Worst: 10/10 Pain location: neck and UE, L>R Pain description: pain, sore, N/T Aggravating factors: driving, across body reaching, certain cervical movements Relieving factors: medication  Yes: NPRS scale: 0/10 Worst: 10/10 Pain location: lower back Pain description: sore, tight Aggravating factors: prolonged walking Relieving factors: medication  PRECAUTIONS: None  RED FLAGS: None   WEIGHT BEARING RESTRICTIONS: No  FALLS:  Has patient fallen in last 6 months? No  LIVING ENVIRONMENT: Lives with: lives with their family Lives in: House/apartment  OCCUPATION: Retired  PLOF: Independent  PATIENT GOALS: pt wants to decrease pain in neck/UE and lower back in order to improve comfort with yard work and walk his dog greater than 1/4 mile  NEXT MD VISIT: 06/05/2023  OBJECTIVE:  Note: Objective measures were completed at Evaluation unless otherwise noted.  DIAGNOSTIC FINDINGS:  See imaging   PATIENT SURVEYS:  Modified Oswestry: 18/50 - 36% disability NDI: 14/50 - 28% disability  COGNITION: Overall cognitive  status: Within functional limits for tasks assessed     SENSATION: Light touch: Impaired - L UE  POSTURE: rounded shoulders, forward head, and increased lumbar lordosis  PALPATION: TTP to bilateral upper trap  CERVICAL ROM:   AROM eval 05/28/23  Flexion    Extension  Right lateral flexion    Left lateral flexion    Right rotation 45 61  Left rotation 38 61   (Blank rows = not tested)  UPPER EXTREMITY ROM:  AROM Right Eval Left Eval Right 05/28/23 Left 05/28/23  Shoulder flexion 170 125 169 165  Shoulder extension      Shoulder abduction      Shoulder internal rotation      Shoulder external rotation      Elbow flexion      Elbow extension      Wrist flexion      Wrist extension      (Blank rows = not tested)  UPPER EXTREMITY MMT:  MMT Right Eval Left Eval  Shoulder flexion    Shoulder abduction    Shoulder IR 4/5 5/5  Shoulder ER 4/5 5/5  Shoulder extension    Upper trap    Middle trap    Lower trap    Elbow flexion     Elbow extension    Wrist flexion    Wrist extension    Grip 70lbs 60lbs   (Blank rows = not tested)  LUMBAR SPECIAL TESTS:  Straight leg raise test: Negative and Slump test: Negative  FUNCTIONAL TESTS:  30 Sec Sit to Stand: 10 reps no UE DNF Endurance: 8 seconds  GAIT: Distance walked: 43ft Assistive device utilized: None Level of assistance: Complete Independence Comments: trunk flexed  TREATMENT: [facility] Adult PT Treatment:                                                DATE: 05/30/23 Nu-step L5 x 4 min for functional activity tolerance Supine chin tuck x 10 - 5" hold Supine pilates SLR 2x15 each Pilates bridge 3x10 90/90 2x20"  90/90 alt taps 2x10 STS RTB around knee 5# DB - low table on foam Sustained chin tuck against the wall with ER 2x10 RTB Sustained chin tuck against the wall with horizontal abd 2x10 RTB Standing row 2x10 20# Standing ext 2x10 20# Pallof press x 10 10#  [facility] Adult PT Treatment:                                                 DATE: 05/28/23 MTPR along the bil glute med x 2 Nu-step L5 x 5 in LE only Sustained chin tuck against the wall while doing horizontal abduction and diagonals with RTB Standing on LLE with contralateral Heel strike/ toe off 2 x 15, repeated on the RLE Sit to stand with RTB around knees 2 x 10 Updated HEP today  [facility] Adult PT Treatment:                                                DATE: 05/22/23 Nu-step L5 x 4 min UE/LE for functional activity tolerance Supine chin tuck x 10 - 3" hold Supine horizontal abd 2x15 GTB Pilates bridge 3x10 Supine pilates SLR 2x10 each 90/90 hold 2x20"  90/90 alt taps 2x10 Standing ER with chin tuck against wall 2x10 Pallof press 2x10 13#  [facility] Adult PT Treatment:  DATE: 05/20/23 Nu-step L5 x 5 min UE/LE for functional activity tolerance Supine chin tuck x 10 - 3" hold Supine horizontal abd 2x15 GTB Pilates bridge 3x10 Supine pilates SLR 2x10 each 90/90 hold 2x20"  S/L hip abd 2x10 Pallof press 2x10 13# FM row 2x12 20# FM extension 2x10 20# Standing ER 2x10 each Seated low row 2x10 25#   PATIENT EDUCATION:  Education details: HEP update Person educated: Patient Education method: Explanation, Demonstration, and Handouts Education comprehension: verbalized understanding and returned demonstration  HOME EXERCISE PROGRAM: Access Code: [email or web address] URL: [email or web address] Date: 05/28/2023 Prepared by: Laron Plummer  Exercises - Seated Upper Trapezius Stretch  - 1 x daily - 7 x weekly - 2 sets - 2 reps - 30 seconds hold - Standing Shoulder Row with Anchored Resistance  - 1 x daily - 7 x weekly - 3 sets - 10 reps - blue band hold - Supine Chin Tuck  - 1 x daily - 7 x weekly - 2 sets - 10 reps - Supine Deep Neck Flexor Training - Repetitions  - 1 x daily - 7 x weekly - 2 sets - 10 reps - 10-20 seconds hold - Shoulder External Rotation and Scapular Retraction  with Resistance  - 1 x daily - 7 x weekly - 3 sets - 10 reps - red band hold - Supine Posterior Pelvic Tilt  - 1 x daily - 7 x weekly - 2 sets - 20 reps - holding 10 sec hold - Supine March with Posterior Pelvic Tilt  - 1 x daily - 7 x weekly - 2 sets - 10 reps - Seated Anterior Pelvic Tilt  - 1 x daily - 7 x weekly - 2 sets - 10 reps - 5 seconds hold - Lower Trunk Rotation  - 1 x daily - 7 x weekly - 2 sets - 10 reps - Isometric Dead Bug  - 1 x daily - 7 x weekly - 2 sets - 10 reps - holding 10 seconds hold - Standing Anti-Rotation Press with Anchored Resistance  - 1 x daily - 7 x weekly - 2 sets - 10 reps - 1 sec hold - Supine Bridge with Resistance Band  - 1 x daily - 7 x weekly - 2 sets - 10 reps - blue band hold - Sidelying Hip Abduction  - 1 x daily - 7 x weekly - 2 sets - 10 reps - Sit to Stand with Resistance Around Legs  - 1 x daily - 7 x weekly - 2 sets - 10 reps  ASSESSMENT:  CLINICAL IMPRESSION: Pt was able to complete all prescribed exercises with no adverse effect. Today therapy focused on exercises for decreasing pain and strengthening for DNF/periscapular muscles as well as core/hip in order to improve functional mobility. No changes to HEP today. Pt is progressing well with therapy, will continue to progress as able.   Evaluation: Patient is a 63 y.o. M who was seen today for physical therapy evaluation and treatment for chronic neck and LBP. Physical findings are consistent with physician impression as pt demonstrates decrease in cervical ROM as well as postural and hip/core strength deficits. ODI and NDI scores suggest minimal/moderate disability in performance of home ADLs and community level activity, indicating he is operating well below PLOF. Pt would benefit from skilled PT services working on improving strength of postural muscles and multimodal approach of manual and exercise to decrease pain and improve function.    OBJECTIVE IMPAIRMENTS: decreased activity tolerance,  decreased endurance,  decreased mobility, difficulty walking, decreased ROM, decreased strength, and pain  ACTIVITY LIMITATIONS: carrying, lifting, sitting, standing, squatting, stairs, reach over head, and locomotion level  PARTICIPATION LIMITATIONS: driving, shopping, community activity, occupation, and yard work  PERSONAL FACTORS: Time since onset of injury/illness/exacerbation and 1-2 comorbidities: HTN, subdural hemorrhage are also affecting patient's functional outcome.   REHAB POTENTIAL: Good  CLINICAL DECISION MAKING: Evolving/moderate complexity  EVALUATION COMPLEXITY: Moderate   GOALS: Goals reviewed with patient? No  SHORT TERM GOALS: Target date: 05/14/2023   Pt will be compliant and knowledgeable with initial HEP for improved comfort and carryover Baseline: initial HEP given  Goal status: MET  2.  Pt will self report neck/UE and lower back pain no greater than 7/10 for improved comfort and functional ability Baseline: 10/10 at worst 05/22/2023: 9/10 at worst Goal status: MET   LONG TERM GOALS: Target date: 06/18/2023   Pt will be decrease ODI disability score to no greater than 20% disability as proxy for functional improvement with home ADLs and community activites Baseline: 18/50 - 36% disability Goal status: INITIAL  2.  Pt will decrease NDI disability score to no greater than 20% disability as proxy for functional improvement with ADLs and community activities Baseline: 28% disability Goal status: INITIAL   3.  Pt will increase 30 Second Sit to Stand rep count to no less than 12 reps for improved balance, strength, and functional mobility Baseline: 10 reps  Goal status: INITIAL   4.  Pt will improve walking distance to at least 1 mile while walking his dog with decreased LBP for improved comfort with desired recreational activity Baseline: 1/4 mile Goal status: INITIAL  5.  Pt will improve bilateral cervical rotation to no less than 60 degrees for improved  comfort and functional ability with driving Baseline: see ROM chart Goal status: INITIAL  6.  Pt will improve L grip strength to at least 70lbs for improved functional ability with grasp and ADL tasks Baseline: see ROM chart Goal status: INITIAL  PLAN:  PT FREQUENCY: 2x/week  PT DURATION: 8 weeks  PLANNED INTERVENTIONS: [id number]- PT Re-evaluation, 97110-Therapeutic exercises, 97530- Therapeutic activity, [id number]- Neuromuscular re-education, 97535- Self Care, [id number]- Manual therapy, [id number]- Gait training, [id number]- Aquatic Therapy, [id number]- Electrical stimulation (unattended), [id number]- Electrical stimulation (manual), 97016- Vasopneumatic device, Dry Needling, Cryotherapy, and Moist heat.  PLAN FOR NEXT SESSION: progressive HEP PRN , periscapular/DNF, hip/core strength.   Ivor Mars PT  05/30/23 9:31 AM

## 2023-06-03 ENCOUNTER — Encounter (INDEPENDENT_AMBULATORY_CARE_PROVIDER_SITE_OTHER): Payer: Self-pay

## 2023-06-03 ENCOUNTER — Ambulatory Visit: Admitting: Physical Therapy

## 2023-06-03 DIAGNOSIS — R2689 Other abnormalities of gait and mobility: Secondary | ICD-10-CM

## 2023-06-03 DIAGNOSIS — M542 Cervicalgia: Secondary | ICD-10-CM

## 2023-06-03 DIAGNOSIS — M5459 Other low back pain: Secondary | ICD-10-CM

## 2023-06-03 DIAGNOSIS — G8929 Other chronic pain: Secondary | ICD-10-CM

## 2023-06-03 DIAGNOSIS — M6281 Muscle weakness (generalized): Secondary | ICD-10-CM

## 2023-06-03 NOTE — Therapy (Signed)
 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   Subdural hemorrhage (HCC)    Tubular adenoma of colon 11/03/2017   Colonoscopy Sept 9, 2019   Past Surgical History:  Procedure Laterality Date   CARDIAC CATHETERIZATION     CORONARY STENT INTERVENTION N/A 01/30/2021   Procedure: CORONARY STENT INTERVENTION;  Surgeon: Arty Binning, MD;  Location: [facility];  Service: Cardiovascular;  Laterality: N/A;   Examination under anesthesia, repair of anal fissure,  12/04/2000   LEFT HEART CATH AND CORONARY ANGIOGRAPHY N/A 01/30/2021   Procedure: LEFT HEART CATH AND CORONARY ANGIOGRAPHY;  Surgeon: Arty Binning, MD;  Location: [facility];  Service: Cardiovascular;  Laterality: N/A;   PERIPHERAL VASCULAR CATHETERIZATION Left 02/06/2016   Procedure: Lower Extremity Angiography;  Surgeon: Jackquelyn Mass, MD;  Location: [facility];  Service: Cardiovascular;   Laterality: Left;   PERIPHERAL VASCULAR CATHETERIZATION N/A 02/06/2016   Procedure: Abdominal Aortogram w/Lower Extremity;  Surgeon: Jackquelyn Mass, MD;  Location: [facility];  Service: Cardiovascular;  Laterality: N/A;   PERIPHERAL VASCULAR CATHETERIZATION  02/06/2016   Procedure: Lower Extremity Intervention;  Surgeon: Jackquelyn Mass, MD;  Location: [facility];  Service: Cardiovascular;;   Patient Active Problem List   Diagnosis Date Noted   Trigger finger of left hand 05/15/2023   Cervicalgia 03/21/2023   OSA on CPAP    Snoring 10/15/2022   PAD (peripheral artery disease) (HCC) 10/15/2022   Overweight 09/12/2022   Needs flu shot 09/12/2022   Type 2 myocardial infarction (HCC) 04/14/2022   Nausea, vomiting, and diarrhea 03/29/2022   Preventative health care 03/01/2022   Black stools 01/08/2022   Chronic neck pain 07/12/2021   Chronic pain of both shoulders 07/12/2021   Sensation of fullness in both ears 04/05/2021   History of cocaine use 03/02/2021   CAD in native artery 01/31/2021   S/P angioplasty with stent 01/30/21 to LAD  01/31/2021   Unstable angina (HCC) 01/30/2021   Chest pain    B12 deficiency 09/13/2020   Family history of malignant neoplasm of digestive organs 08/08/2020   Rectal bleeding 08/08/2020   Body mass index (  BMI) 27.0-27.9, adult 04/07/2020   Laceration of scalp without complication 03/23/2020   Tubular adenoma of colon 11/03/2017   Type 2 diabetes mellitus (HCC) 06/18/2016   Hepatitis B immune 03/11/2016   Atherosclerosis with claudication of extremity (HCC) 01/29/2016   Arthralgia 10/26/2014   Gastroesophageal reflux disease 06/23/2013   Tobacco use disorder 04/27/2013   Erectile dysfunction 10/27/2012   Insomnia 04/15/2012   Generalized anxiety disorder 02/26/2011    Class: Acute   Suicide attempt (HCC) 02/20/2011   HIV (human immunodeficiency virus infection) (HCC) 11/01/2010   Essential (primary) hypertension  11/01/2010   Hyperlipidemia 11/01/2010    PCP: Zorita Hiss, NP  REFERRING PROVIDER: Syliva Even, MD   REFERRING DIAG:  M54.2 (ICD-10-CM) - Cervicalgia M25.511,G89.29,M25.512 (ICD-10-CM) - Chronic pain of both shoulders M54.50,G89.29 (ICD-10-CM) - Chronic bilateral low back pain without sciatica  Rationale for Evaluation and Treatment: Rehabilitation  THERAPY DIAG:  Cervicalgia  Chronic pain of both shoulders  Muscle weakness (generalized)  Other low back pain  Other abnormalities of gait and mobility  ONSET DATE: Chronic  SUBJECTIVE:                                                                                                                                                                                           SUBJECTIVE STATEMENT: "Overall I am doing well, did some walking around the park and I did good but did need to stp and rest every so often."  PERTINENT HISTORY:  HTN, subdural hemorrhage   PAIN:  Are you having pain?  Yes: NPRS scale: 1/10 Worst: 10/10 Pain location: neck and UE, L>R Pain description: pain, sore, N/T Aggravating factors: driving, across body reaching, certain cervical movements Relieving factors: medication  Yes: NPRS scale: 0/10 Worst: 10/10 Pain location: lower back Pain description: sore, tight Aggravating factors: prolonged walking Relieving factors: medication  PRECAUTIONS: None  RED FLAGS: None   WEIGHT BEARING RESTRICTIONS: No  FALLS:  Has patient fallen in last 6 months? No  LIVING ENVIRONMENT: Lives with: lives with their family Lives in: House/apartment  OCCUPATION: Retired  PLOF: Independent  PATIENT GOALS: pt wants to decrease pain in neck/UE and lower back in order to improve comfort with yard work and walk his dog greater than 1/4 mile  NEXT MD VISIT: 06/05/2023  OBJECTIVE:  Note: Objective measures were completed at Evaluation unless otherwise noted.  DIAGNOSTIC FINDINGS:  See imaging    PATIENT SURVEYS:  Modified Oswestry: 18/50 - 36% disability NDI: 14/50 - 28% disability  COGNITION: Overall cognitive status: Within functional limits for tasks assessed     SENSATION: Light touch: Impaired - L UE  POSTURE: rounded shoulders, forward head, and increased lumbar lordosis  PALPATION: TTP to bilateral upper trap  CERVICAL ROM:   AROM eval 05/28/23  Flexion    Extension    Right lateral flexion    Left lateral flexion    Right rotation 45 61  Left rotation 38 61   (Blank rows = not tested)  UPPER EXTREMITY ROM:  AROM Right Eval Left Eval Right 05/28/23 Left 05/28/23  Shoulder flexion 170 125 169 165  Shoulder extension      Shoulder abduction      Shoulder internal rotation      Shoulder external rotation      Elbow flexion      Elbow extension      Wrist flexion      Wrist extension      (Blank rows = not tested)  UPPER EXTREMITY MMT:  MMT Right Eval Left Eval  Shoulder flexion    Shoulder abduction    Shoulder IR 4/5 5/5  Shoulder ER 4/5 5/5  Shoulder extension    Upper trap    Middle trap    Lower trap    Elbow flexion     Elbow extension    Wrist flexion    Wrist extension    Grip 70lbs 60lbs   (Blank rows = not tested)  LUMBAR SPECIAL TESTS:  Straight leg raise test: Negative and Slump test: Negative  FUNCTIONAL TESTS:  30 Sec Sit to Stand: 10 reps no UE DNF Endurance: 8 seconds  GAIT: Distance walked: 36ft Assistive device utilized: None Level of assistance: Complete Independence Comments: trunk flexed  TREATMENT: [facility] Adult PT Treatment:                                                DATE: 06/03/2023 Bil arch taping to promote arch support and provided information to benefits Treadmill walking 1.6 x 5 min  Slant board calf stretch 2 x 30 sec with knee bent/ straight Standing heel raise with ball squeeze 2 x to fatigue Standing hip abduction with 3# bil to fatigue Alternating marching in place 1 x to fatigue 3#   Mini squat with back against ball on the wall 2 x 12 Updated HEP today and modified visits to 1 x every other week.  [facility] Adult PT Treatment:                                                DATE: 05/30/23 Nu-step L5 x 4 min for functional activity tolerance Supine chin tuck x 10 - 5" hold Supine pilates SLR 2x15 each Pilates bridge 3x10 90/90 2x20"  90/90 alt taps 2x10 STS RTB around knee 5# DB - low table on foam Sustained chin tuck against the wall with ER 2x10 RTB Sustained chin tuck against the wall with horizontal abd 2x10 RTB Standing row 2x10 20# Standing ext 2x10 20# Pallof press x 10 10#  [facility] Adult PT Treatment:                                                DATE: 05/28/23  MTPR along the bil glute med x 2 Nu-step L5 x 5 in LE only Sustained chin tuck against the wall while doing horizontal abduction and diagonals with RTB Standing on LLE with contralateral Heel strike/ toe off 2 x 15, repeated on the RLE Sit to stand with RTB around knees 2 x 10 Updated HEP today  PATIENT EDUCATION:  Education details: HEP update Person educated: Patient Education method: Explanation, Demonstration, and Handouts Education comprehension: verbalized understanding and returned demonstration  HOME EXERCISE PROGRAM: Access Code: [email or web address] URL: [email or web address] Date: 05/28/2023 Prepared by: Laron Plummer  Exercises - Seated Upper Trapezius Stretch  - 1 x daily - 7 x weekly - 2 sets - 2 reps - 30 seconds hold - Standing Shoulder Row with Anchored Resistance  - 1 x daily - 7 x weekly - 3 sets - 10 reps - blue band hold - Supine Chin Tuck  - 1 x daily - 7 x weekly - 2 sets - 10 reps - Supine Deep Neck Flexor Training - Repetitions  - 1 x daily - 7 x weekly - 2 sets - 10 reps - 10-20 seconds hold - Shoulder External Rotation and Scapular Retraction with Resistance  - 1 x daily - 7 x weekly - 3 sets - 10 reps - red band hold - Supine Posterior Pelvic Tilt  - 1 x daily -  7 x weekly - 2 sets - 20 reps - holding 10 sec hold - Supine March with Posterior Pelvic Tilt  - 1 x daily - 7 x weekly - 2 sets - 10 reps - Seated Anterior Pelvic Tilt  - 1 x daily - 7 x weekly - 2 sets - 10 reps - 5 seconds hold - Lower Trunk Rotation  - 1 x daily - 7 x weekly - 2 sets - 10 reps - Isometric Dead Bug  - 1 x daily - 7 x weekly - 2 sets - 10 reps - holding 10 seconds hold - Standing Anti-Rotation Press with Anchored Resistance  - 1 x daily - 7 x weekly - 2 sets - 10 reps - 1 sec hold - Supine Bridge with Resistance Band  - 1 x daily - 7 x weekly - 2 sets - 10 reps - blue band hold - Sidelying Hip Abduction  - 1 x daily - 7 x weekly - 2 sets - 10 reps - Sit to Stand with Resistance Around Legs  - 1 x daily - 7 x weekly - 2 sets - 10 reps  ASSESSMENT:  CLINICAL IMPRESSION: 5/20/2025Steve continues to make excellent progress with physical therapy improving both his neck and back pain. He reports doing more walking noting required intermittent breaks but had more challenges with soreness in his arches. Trialed arching taping which he noted feeling much better and planned to visit [employer] to see about off the shelf orthotics. Continued working on gross LE strengthening. He noted no pain end of sessio. Dropped pt down to 1 x every other week to assess response to HEP an reduced PT visit frequency.   Evaluation: Patient is a 63 y.o. M who was seen today for physical therapy evaluation and treatment for chronic neck and LBP. Physical findings are consistent with physician impression as pt demonstrates decrease in cervical ROM as well as postural and hip/core strength deficits. ODI and NDI scores suggest minimal/moderate disability in performance of home ADLs and community level activity, indicating he is operating well below PLOF. Pt would benefit from skilled PT services  working on improving strength of postural muscles and multimodal approach of manual and exercise to decrease pain and  improve function.    OBJECTIVE IMPAIRMENTS: decreased activity tolerance, decreased endurance, decreased mobility, difficulty walking, decreased ROM, decreased strength, and pain  ACTIVITY LIMITATIONS: carrying, lifting, sitting, standing, squatting, stairs, reach over head, and locomotion level  PARTICIPATION LIMITATIONS: driving, shopping, community activity, occupation, and yard work  PERSONAL FACTORS: Time since onset of injury/illness/exacerbation and 1-2 comorbidities: HTN, subdural hemorrhage are also affecting patient's functional outcome.   REHAB POTENTIAL: Good  CLINICAL DECISION MAKING: Evolving/moderate complexity  EVALUATION COMPLEXITY: Moderate   GOALS: Goals reviewed with patient? No  SHORT TERM GOALS: Target date: 05/14/2023   Pt will be compliant and knowledgeable with initial HEP for improved comfort and carryover Baseline: initial HEP given  Goal status: MET  2.  Pt will self report neck/UE and lower back pain no greater than 7/10 for improved comfort and functional ability Baseline: 10/10 at worst 05/22/2023: 9/10 at worst Goal status: MET   LONG TERM GOALS: Target date: 06/18/2023   Pt will be decrease ODI disability score to no greater than 20% disability as proxy for functional improvement with home ADLs and community activites Baseline: 18/50 - 36% disability Goal status: INITIAL  2.  Pt will decrease NDI disability score to no greater than 20% disability as proxy for functional improvement with ADLs and community activities Baseline: 28% disability Goal status: INITIAL   3.  Pt will increase 30 Second Sit to Stand rep count to no less than 12 reps for improved balance, strength, and functional mobility Baseline: 10 reps  Goal status: INITIAL   4.  Pt will improve walking distance to at least 1 mile while walking his dog with decreased LBP for improved comfort with desired recreational activity Baseline: 1/4 mile Goal status: INITIAL  5.  Pt will  improve bilateral cervical rotation to no less than 60 degrees for improved comfort and functional ability with driving Baseline: see ROM chart Goal status: INITIAL  6.  Pt will improve L grip strength to at least 70lbs for improved functional ability with grasp and ADL tasks Baseline: see ROM chart Goal status: INITIAL  PLAN:  PT FREQUENCY: 2x/week  PT DURATION: 8 weeks  PLANNED INTERVENTIONS: 97164- PT Re-evaluation, 97110-Therapeutic exercises, 97530- Therapeutic activity, [id number]- Neuromuscular re-education, 97535- Self Care, 69629- Manual therapy, [id number]- Gait training, [id number]- Aquatic Therapy, [id number]- Electrical stimulation (unattended), [id number]- Electrical stimulation (manual), 97016- Vasopneumatic device, Dry Needling, Cryotherapy, and Moist heat.  PLAN FOR NEXT SESSION: progressive HEP PRN , periscapular/DNF, hip/core strength.   Mazzie Brodrick PT, DPT, LAT, ATC  06/03/23  11:02 AM

## 2023-06-04 ENCOUNTER — Other Ambulatory Visit (HOSPITAL_COMMUNITY): Payer: Self-pay

## 2023-06-04 ENCOUNTER — Other Ambulatory Visit: Payer: Self-pay

## 2023-06-04 NOTE — Progress Notes (Signed)
 Specialty Pharmacy Refill Coordination Note  Jacob Ali is a 63 y.o. male contacted today regarding refills of specialty medication(s) Bictegravir-Emtricitab-Tenofov (Biktarvy )   Patient requested (Patient-Rptd) Delivery   Delivery date: (Patient-Rptd) 06/13/23   Verified address: (Patient-Rptd) [address] [place], [place] [address]   Medication will be filled on 06/12/23.

## 2023-06-05 ENCOUNTER — Ambulatory Visit

## 2023-06-05 ENCOUNTER — Encounter: Payer: Self-pay | Admitting: Family Medicine

## 2023-06-05 ENCOUNTER — Ambulatory Visit (INDEPENDENT_AMBULATORY_CARE_PROVIDER_SITE_OTHER)

## 2023-06-05 ENCOUNTER — Other Ambulatory Visit: Payer: Self-pay

## 2023-06-05 ENCOUNTER — Ambulatory Visit (INDEPENDENT_AMBULATORY_CARE_PROVIDER_SITE_OTHER): Admitting: Family Medicine

## 2023-06-05 VITALS — BP 110/84 | HR 84 | Ht 69.0 in | Wt 187.0 lb

## 2023-06-05 DIAGNOSIS — M545 Low back pain, unspecified: Secondary | ICD-10-CM

## 2023-06-05 DIAGNOSIS — M2550 Pain in unspecified joint: Secondary | ICD-10-CM | POA: Diagnosis not present

## 2023-06-05 DIAGNOSIS — M791 Myalgia, unspecified site: Secondary | ICD-10-CM

## 2023-06-05 DIAGNOSIS — G8929 Other chronic pain: Secondary | ICD-10-CM

## 2023-06-05 DIAGNOSIS — M65332 Trigger finger, left middle finger: Secondary | ICD-10-CM | POA: Diagnosis not present

## 2023-06-05 DIAGNOSIS — M65331 Trigger finger, right middle finger: Secondary | ICD-10-CM | POA: Diagnosis not present

## 2023-06-05 LAB — CK: Total CK: 81 U/L (ref 7–232)

## 2023-06-05 LAB — SEDIMENTATION RATE: Sed Rate: -4 mm/h — ABNORMAL LOW (ref 0–20)

## 2023-06-05 LAB — C-REACTIVE PROTEIN: CRP: <1 mg/dL (ref 0.5–20.0)

## 2023-06-05 NOTE — Patient Instructions (Addendum)
 Thank you for coming in today.   I am looking for polymyalgia rheumatica.  You received an injection today. Seek immediate medical attention if the joint becomes red, extremely painful, or is oozing fluid.   Please get an Xray today before you leave   Please get labs today before you leave   If not better with physical therapy, we can order a MRI.   We are sorry to hear about your mom.

## 2023-06-05 NOTE — Progress Notes (Signed)
 I, Jacob Ali, CMA acting as a scribe for Jacob Juniper, MD.  Jacob Ali is a 63 y.o. male who presents to [facility] today for f/u cervicalgia, low back, and bilat shoulder pain. Pt was last seen by Dr. Alease Hunter on 04/10/23 and was advised to use the cyclobenzaprine  primarily at bedtime. Pt was also referred to PT, completing 12 visits.  Today, pt reports improvement of neck and bilat shoulder pain with PT. Continues to have lower back pain. Flexeril  was d/c, now taking Methocarbamol . Minimal relief with this. Naproxen  seems to help with most. Denies groin pain but reports pain into the gluteal region. Occasional exacerbation of neck sx, mostly in the mornings. Going to PT once q2w.Feels that back pain may be related to arches dropping in the feet. Has purchased new insoles recently. Also c/o bilat trigger finger, 3rd digit, nodules present.   He notes continued back and shoulder pain a bit better with physical therapy.  Additionally notes his overall myalgias have improved after reducing his fenofibrate  dose.  Additionally his mother is very ill and currently in hospice care.  She has stopped taking her medications and looks to be nearing the end of her life.  This is obviously challenging for Jacob Ali.  He is not sleeping very well.  Dx imaging: 03/21/23 C-spine XR   Pertinent review of systems: No fevers or chills.  Relevant historical information: Heart disease.  Diabetes.  Sleep apnea.   Exam:  BP 110/84   Pulse 84   Ht 5\' 9"  (1.753 m)   Wt 187 lb (84.8 kg)   SpO2 92%   BMI 27.62 kg/m  General: Well Developed, well nourished, and in no acute distress.   MSK: Hands bilaterally trigger finger third digit.  Tender to palpation.  Intact strength.  L-spine nontender to palpation midline.  Normal lumbar motion.    Lab and Radiology Results    Procedure: Real-time Ultrasound Guided Injection of right third MCP tendon sheath at A1 pulley.  Trigger  finger injection Device: Philips Affiniti 50G/GE Logiq Images permanently stored and available for review in PACS Verbal informed consent obtained.  Discussed risks and benefits of procedure. Warned about infection, bleeding, hyperglycemia damage to structures among others. Patient expresses understanding and agreement Time-out conducted.   Noted no overlying erythema, induration, or other signs of local infection.   Skin prepped in a sterile fashion.   Local anesthesia: Topical Ethyl chloride.   With sterile technique and under real time ultrasound guidance: 40 mg of Kenalog and 1 mL of lidocaine  injected into A1 pulley tendon sheath. Fluid seen entering the tendon sheath.   Completed without difficulty   Pain immediately resolved suggesting accurate placement of the medication.   Advised to call if fevers/chills, erythema, induration, drainage, or persistent bleeding.   Images permanently stored and available for review in the ultrasound unit.  Impression: Technically successful ultrasound guided injection.   Procedure: Real-time Ultrasound Guided Injection of left third MCP tendon sheath at A1 pulley.  Trigger finger injection Device: Philips Affiniti 50G/GE Logiq Images permanently stored and available for review in PACS Verbal informed consent obtained.  Discussed risks and benefits of procedure. Warned about infection, bleeding, hyperglycemia damage to structures among others. Patient expresses understanding and agreement Time-out conducted.   Noted no overlying erythema, induration, or other signs of local infection.   Skin prepped in a sterile fashion.   Local anesthesia: Topical Ethyl chloride.   With sterile technique and under real  time ultrasound guidance: 40 mg of Kenalog and 1 mL of lidocaine  injected into A1 pulley tendon sheath. Fluid seen entering the tendon sheath.   Completed without difficulty   Pain immediately resolved suggesting accurate placement of the medication.    Advised to call if fevers/chills, erythema, induration, drainage, or persistent bleeding.   Images permanently stored and available for review in the ultrasound unit.  Impression: Technically successful ultrasound guided injection.    X-ray images lumbar spine obtained today personally and independently interpreted. No acute fractures.  Degenerative changes present L5-S1. Await formal radiology review   Assessment and Plan: 63 y.o. male with bilateral trigger finger plan for injection today.  Additionally patient has continued myalgias and arthralgias.  I am concerned he may have a rheumatologic disorder.  Most likely will be polymyalgia rheumatica.  Plan for rheumatologic workup listed below.  Additionally he has continued low back pain.  He is doing physical therapy which is helpful.  Plan for updated lumbar spine x-ray.  If not improving after physical therapy ends next step would typically be MRI lumbar spine.  He will let me know.  Lastly we talked about his mother.  She is currently at the end of her life and hospice care.  He is doing okay but is struggling a little bit.   PDMP not reviewed this encounter. Orders Placed This Encounter  Procedures   US  LIMITED JOINT SPACE STRUCTURES UP BILAT(NO LINKED CHARGES)    Reason for Exam (SYMPTOM  OR DIAGNOSIS REQUIRED):   bilat trigger finger, 3rd digit    Preferred imaging location?:   [facility] Lumbar Spine 2-3 Views    Standing Status:   Future    Number of Occurrences:   1    Expiration Date:   07/06/2023    Reason for Exam (SYMPTOM  OR DIAGNOSIS REQUIRED):   low back pain    Preferred imaging location?:   [facility]   [facility]    Standing Status:   Future    Number of Occurrences:   1    Expiration Date:   06/04/2024   Cyclic citrul peptide antibody, IgG    Standing Status:   Future    Number of Occurrences:   1    Expiration Date:   06/04/2024   HLA-B27 antigen    Polyarthalgia     Standing Status:   Future    Number of Occurrences:   1    Expiration Date:   06/04/2024   Rheumatoid factor    Standing Status:   Future    Number of Occurrences:   1    Expiration Date:   06/04/2024   Sedimentation rate    Standing Status:   Future    Number of Occurrences:   1    Expiration Date:   06/04/2024   CK (Creatine Kinase)    Standing Status:   Future    Number of Occurrences:   1    Expiration Date:   06/04/2024   C-reactive protein    Standing Status:   Future    Number of Occurrences:   1    Expiration Date:   06/04/2024   No orders of the defined types were placed in this encounter.    Discussed warning signs or symptoms. Please see discharge instructions. Patient expresses understanding.   The above documentation has been reviewed and is accurate and complete Jacob Ali, M.D.

## 2023-06-07 LAB — ANA: Anti Nuclear Antibody (ANA): NEGATIVE

## 2023-06-07 LAB — HLA-B27 ANTIGEN: HLA-B27 Antigen: NEGATIVE

## 2023-06-07 LAB — CYCLIC CITRUL PEPTIDE ANTIBODY, IGG: Cyclic Citrullin Peptide Ab: <16 U

## 2023-06-07 LAB — RHEUMATOID FACTOR: Rheumatoid fact SerPl-aCnc: <10 [IU]/mL (ref <14)

## 2023-06-10 ENCOUNTER — Ambulatory Visit

## 2023-06-10 ENCOUNTER — Ambulatory Visit: Payer: Self-pay | Admitting: Family Medicine

## 2023-06-10 DIAGNOSIS — M6281 Muscle weakness (generalized): Secondary | ICD-10-CM

## 2023-06-10 DIAGNOSIS — G8929 Other chronic pain: Secondary | ICD-10-CM

## 2023-06-10 DIAGNOSIS — M5459 Other low back pain: Secondary | ICD-10-CM

## 2023-06-10 DIAGNOSIS — M542 Cervicalgia: Secondary | ICD-10-CM | POA: Diagnosis not present

## 2023-06-10 DIAGNOSIS — R2689 Other abnormalities of gait and mobility: Secondary | ICD-10-CM

## 2023-06-10 NOTE — Progress Notes (Signed)
 Labs look okay.  No inflammatory markers are abnormal suggesting rheumatoid arthritis or other autoimmune disorders.

## 2023-06-10 NOTE — Therapy (Signed)
 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   Subdural hemorrhage (HCC)    Tubular adenoma of colon 11/03/2017   Colonoscopy Sept 9, 2019   Past Surgical History:  Procedure Laterality Date   CARDIAC CATHETERIZATION     CORONARY STENT INTERVENTION N/A 01/30/2021   Procedure: CORONARY STENT INTERVENTION;  Surgeon: Arty Binning, MD;  Location: [facility];  Service: Cardiovascular;  Laterality: N/A;   Examination under anesthesia, repair of anal fissure,  12/04/2000   LEFT HEART CATH AND CORONARY ANGIOGRAPHY N/A 01/30/2021   Procedure: LEFT HEART CATH AND CORONARY ANGIOGRAPHY;  Surgeon: Arty Binning, MD;  Location: [facility];  Service: Cardiovascular;  Laterality: N/A;   PERIPHERAL VASCULAR CATHETERIZATION Left 02/06/2016   Procedure: Lower Extremity Angiography;  Surgeon: Jackquelyn Mass, MD;  Location: [facility];  Service: Cardiovascular;   Laterality: Left;   PERIPHERAL VASCULAR CATHETERIZATION N/A 02/06/2016   Procedure: Abdominal Aortogram w/Lower Extremity;  Surgeon: Jackquelyn Mass, MD;  Location: [facility];  Service: Cardiovascular;  Laterality: N/A;   PERIPHERAL VASCULAR CATHETERIZATION  02/06/2016   Procedure: Lower Extremity Intervention;  Surgeon: Jackquelyn Mass, MD;  Location: [facility];  Service: Cardiovascular;;   Patient Active Problem List   Diagnosis Date Noted   Trigger finger of left hand 05/15/2023   Cervicalgia 03/21/2023   OSA on CPAP    Snoring 10/15/2022   PAD (peripheral artery disease) (HCC) 10/15/2022   Overweight 09/12/2022   Needs flu shot 09/12/2022   Type 2 myocardial infarction (HCC) 04/14/2022   Nausea, vomiting, and diarrhea 03/29/2022   Preventative health care 03/01/2022   Black stools 01/08/2022   Chronic neck pain 07/12/2021   Chronic pain of both shoulders 07/12/2021   Sensation of fullness in both ears 04/05/2021   History of cocaine use 03/02/2021   CAD in native artery 01/31/2021   S/P angioplasty with stent 01/30/21 to LAD  01/31/2021   Unstable angina (HCC) 01/30/2021   Chest pain    B12 deficiency 09/13/2020   Family history of malignant neoplasm of digestive organs 08/08/2020   Rectal bleeding 08/08/2020   Body mass  index (BMI) 27.0-27.9, adult 04/07/2020   Laceration of scalp without complication 03/23/2020   Tubular adenoma of colon 11/03/2017   Type 2 diabetes mellitus (HCC) 06/18/2016   Hepatitis B immune 03/11/2016   Atherosclerosis with claudication of extremity (HCC) 01/29/2016   Arthralgia 10/26/2014   Gastroesophageal reflux disease 06/23/2013   Tobacco use disorder 04/27/2013   Erectile dysfunction 10/27/2012   Insomnia 04/15/2012   Generalized anxiety disorder 02/26/2011    Class: Acute   Suicide attempt (HCC) 02/20/2011   HIV (human immunodeficiency virus infection) (HCC) 11/01/2010   Essential (primary) hypertension  11/01/2010   Hyperlipidemia 11/01/2010    PCP: Zorita Hiss, NP  REFERRING PROVIDER: Syliva Even, MD   REFERRING DIAG:  M54.2 (ICD-10-CM) - Cervicalgia M25.511,G89.29,M25.512 (ICD-10-CM) - Chronic pain of both shoulders M54.50,G89.29 (ICD-10-CM) - Chronic bilateral low back pain without sciatica  Rationale for Evaluation and Treatment: Rehabilitation  THERAPY DIAG:  Cervicalgia  Chronic pain of both shoulders  Muscle weakness (generalized)  Other low back pain  Other abnormalities of gait and mobility  ONSET DATE: Chronic  SUBJECTIVE:                                                                                                                                                                                           SUBJECTIVE STATEMENT: Pt presents to PT with reports of continued improvement in symptoms. Has been compliant with HEP.   PERTINENT HISTORY:  HTN, subdural hemorrhage   PAIN:  Are you having pain?  Yes: NPRS scale: 1/10 Worst: 10/10 Pain location: neck and UE, L>R Pain description: pain, sore, N/T Aggravating factors: driving, across body reaching, certain cervical movements Relieving factors: medication  Yes: NPRS scale: 0/10 Worst: 10/10 Pain location: lower back Pain description: sore, tight Aggravating factors: prolonged walking Relieving factors: medication  PRECAUTIONS: None  RED FLAGS: None   WEIGHT BEARING RESTRICTIONS: No  FALLS:  Has patient fallen in last 6 months? No  LIVING ENVIRONMENT: Lives with: lives with their family Lives in: House/apartment  OCCUPATION: Retired  PLOF: Independent  PATIENT GOALS: pt wants to decrease pain in neck/UE and lower back in order to improve comfort with yard work and walk his dog greater than 1/4 mile  NEXT MD VISIT: 06/05/2023  OBJECTIVE:  Note: Objective measures were completed at Evaluation unless otherwise noted.  DIAGNOSTIC FINDINGS:  See imaging   PATIENT SURVEYS:   Modified Oswestry: 18/50 - 36% disability NDI: 14/50 - 28% disability  COGNITION: Overall cognitive status: Within functional limits for tasks assessed     SENSATION: Light touch: Impaired - L UE  POSTURE: rounded shoulders, forward head, and  increased lumbar lordosis  PALPATION: TTP to bilateral upper trap  CERVICAL ROM:   AROM eval 05/28/23  Flexion    Extension    Right lateral flexion    Left lateral flexion    Right rotation 45 61  Left rotation 38 61   (Blank rows = not tested)  UPPER EXTREMITY ROM:  AROM Right Eval Left Eval Right 05/28/23 Left 05/28/23  Shoulder flexion 170 125 169 165  Shoulder extension      Shoulder abduction      Shoulder internal rotation      Shoulder external rotation      Elbow flexion      Elbow extension      Wrist flexion      Wrist extension      (Blank rows = not tested)  UPPER EXTREMITY MMT:  MMT Right Eval Left Eval  Shoulder flexion    Shoulder abduction    Shoulder IR 4/5 5/5  Shoulder ER 4/5 5/5  Shoulder extension    Upper trap    Middle trap    Lower trap    Elbow flexion     Elbow extension    Wrist flexion    Wrist extension    Grip 70lbs 60lbs   (Blank rows = not tested)  LUMBAR SPECIAL TESTS:  Straight leg raise test: Negative and Slump test: Negative  FUNCTIONAL TESTS:  30 Sec Sit to Stand: 10 reps no UE DNF Endurance: 8 seconds  GAIT: Distance walked: 18ft Assistive device utilized: None Level of assistance: Complete Independence Comments: trunk flexed  TREATMENT: [facility] Adult PT Treatment:                                                DATE: 06/10/2023 NuStep lvl 5 UE/LE x 5 min while taking subjective Slant board calf stretch 2x30 sec with knee bent/ straight STS 2x10 15# KB Supine pilates SLR 3x10 Pilates bridge 3x10 FM row 2x10 20# Pallof press 3x10 10# Standing hip abd 2x10 25# Lateral walk RTB x 3 laps at counter Supine chin tuck 2x10 - 3" hold POE chin tuck x 10 - 3"  hold Standing chin tuck x 10 RTB  [facility] Adult PT Treatment:                                                DATE: 06/03/2023 Bil arch taping to promote arch support and provided information to benefits Treadmill walking 1.6 x 5 min  Slant board calf stretch 2 x 30 sec with knee bent/ straight Standing heel raise with ball squeeze 2 x to fatigue Standing hip abduction with 3# bil to fatigue Alternating marching in place 1 x to fatigue 3#  Mini squat with back against ball on the wall 2 x 12 Updated HEP today and modified visits to 1 x every other week.  [facility] Adult PT Treatment:                                                DATE: 05/30/23 Nu-step L5 x 4 min for functional activity tolerance Supine  chin tuck x 10 - 5" hold Supine pilates SLR 2x15 each Pilates bridge 3x10 90/90 2x20"  90/90 alt taps 2x10 STS RTB around knee 5# DB - low table on foam Sustained chin tuck against the wall with ER 2x10 RTB Sustained chin tuck against the wall with horizontal abd 2x10 RTB Standing row 2x10 20# Standing ext 2x10 20# Pallof press x 10 10#  [facility] Adult PT Treatment:                                                DATE: 05/28/23 MTPR along the bil glute med x 2 Nu-step L5 x 5 in LE only Sustained chin tuck against the wall while doing horizontal abduction and diagonals with RTB Standing on LLE with contralateral Heel strike/ toe off 2 x 15, repeated on the RLE Sit to stand with RTB around knees 2 x 10 Updated HEP today  PATIENT EDUCATION:  Education details: HEP update Person educated: Patient Education method: Explanation, Demonstration, and Handouts Education comprehension: verbalized understanding and returned demonstration  HOME EXERCISE PROGRAM: Access Code: [email or web address] URL: [email or web address] Date: 06/10/2023 Prepared by: Loral Roch  Exercises - Seated Upper Trapezius Stretch  - 1 x daily - 7 x weekly - 2 sets - 2 reps - 30 seconds hold - Standing Shoulder Row with  Anchored Resistance  - 1 x daily - 7 x weekly - 3 sets - 10 reps - blue band hold - Supine Chin Tuck  - 1 x daily - 7 x weekly - 2 sets - 10 reps - Supine Deep Neck Flexor Training - Repetitions  - 1 x daily - 7 x weekly - 2 sets - 10 reps - 10-20 seconds hold - Shoulder External Rotation and Scapular Retraction with Resistance  - 1 x daily - 7 x weekly - 3 sets - 10 reps - red band hold - Supine Posterior Pelvic Tilt  - 1 x daily - 7 x weekly - 2 sets - 20 reps - holding 10 sec hold - Supine March with Posterior Pelvic Tilt  - 1 x daily - 7 x weekly - 2 sets - 10 reps - Seated Anterior Pelvic Tilt  - 1 x daily - 7 x weekly - 2 sets - 10 reps - 5 seconds hold - Lower Trunk Rotation  - 1 x daily - 7 x weekly - 2 sets - 10 reps - Isometric Dead Bug  - 1 x daily - 7 x weekly - 2 sets - 10 reps - holding 10 seconds hold - Standing Anti-Rotation Press with Anchored Resistance  - 1 x daily - 7 x weekly - 2 sets - 10 reps - 1 sec hold - Supine Bridge with Resistance Band  - 1 x daily - 7 x weekly - 2 sets - 10 reps - blue band hold - Sidelying Hip Abduction  - 1 x daily - 7 x weekly - 2 sets - 10 reps - Sit to Stand with Resistance Around Legs  - 1 x daily - 7 x weekly - 2 sets - 10 reps - Standing Calf Raise With Small Ball at Heels  - 1 x daily - 7 x weekly - 2 sets - 10 reps - Standing Bilateral Gastroc Stretch with Step  - 1 x daily - 7 x weekly - 2 sets - 10 reps - Side  Stepping with Resistance at Ankles and Counter Support  - 4 x weekly - 3 sets - red band hold - Standing Hip Abduction with Resistance at Ankles and Counter Support  - 4 x weekly - 2 sets - 10 reps - red band hold  ASSESSMENT:  CLINICAL IMPRESSION: Pt was able to complete all prescribed exercises with no adverse effect. Today therapy focused on exercises for decreasing pain and strengthening for DNF/periscapular muscles as well as core/hip in order to improve functional mobility. HEP updated for progression of proximal hip  strength. Pt is progressing well with therapy, will continue to progress as able.   Evaluation: Patient is a 63 y.o. M who was seen today for physical therapy evaluation and treatment for chronic neck and LBP. Physical findings are consistent with physician impression as pt demonstrates decrease in cervical ROM as well as postural and hip/core strength deficits. ODI and NDI scores suggest minimal/moderate disability in performance of home ADLs and community level activity, indicating he is operating well below PLOF. Pt would benefit from skilled PT services working on improving strength of postural muscles and multimodal approach of manual and exercise to decrease pain and improve function.    OBJECTIVE IMPAIRMENTS: decreased activity tolerance, decreased endurance, decreased mobility, difficulty walking, decreased ROM, decreased strength, and pain  ACTIVITY LIMITATIONS: carrying, lifting, sitting, standing, squatting, stairs, reach over head, and locomotion level  PARTICIPATION LIMITATIONS: driving, shopping, community activity, occupation, and yard work  PERSONAL FACTORS: Time since onset of injury/illness/exacerbation and 1-2 comorbidities: HTN, subdural hemorrhage are also affecting patient's functional outcome.   REHAB POTENTIAL: Good  CLINICAL DECISION MAKING: Evolving/moderate complexity  EVALUATION COMPLEXITY: Moderate   GOALS: Goals reviewed with patient? No  SHORT TERM GOALS: Target date: 05/14/2023   Pt will be compliant and knowledgeable with initial HEP for improved comfort and carryover Baseline: initial HEP given  Goal status: MET  2.  Pt will self report neck/UE and lower back pain no greater than 7/10 for improved comfort and functional ability Baseline: 10/10 at worst 05/22/2023: 9/10 at worst Goal status: MET   LONG TERM GOALS: Target date: 06/18/2023   Pt will be decrease ODI disability score to no greater than 20% disability as proxy for functional improvement with  home ADLs and community activites Baseline: 18/50 - 36% disability Goal status: INITIAL  2.  Pt will decrease NDI disability score to no greater than 20% disability as proxy for functional improvement with ADLs and community activities Baseline: 28% disability Goal status: INITIAL   3.  Pt will increase 30 Second Sit to Stand rep count to no less than 12 reps for improved balance, strength, and functional mobility Baseline: 10 reps  Goal status: INITIAL   4.  Pt will improve walking distance to at least 1 mile while walking his dog with decreased LBP for improved comfort with desired recreational activity Baseline: 1/4 mile Goal status: INITIAL  5.  Pt will improve bilateral cervical rotation to no less than 60 degrees for improved comfort and functional ability with driving Baseline: see ROM chart Goal status: INITIAL  6.  Pt will improve L grip strength to at least 70lbs for improved functional ability with grasp and ADL tasks Baseline: see ROM chart Goal status: INITIAL  PLAN:  PT FREQUENCY: 2x/week  PT DURATION: 8 weeks  PLANNED INTERVENTIONS: 97164- PT Re-evaluation, 97110-Therapeutic exercises, 97530- Therapeutic activity, [id number]- Neuromuscular re-education, 97535- Self Care, [id number]- Manual therapy, [id number]- Gait training, [phone]- Aquatic Therapy, [phone]- Electrical stimulation (unattended), [phone]- Electrical  stimulation (manual), 16109- Vasopneumatic device, Dry Needling, Cryotherapy, and Moist heat.  PLAN FOR NEXT SESSION: progressive HEP PRN , periscapular/DNF, hip/core strength.   Ivor Mars PT  06/10/23 11:05 AM

## 2023-06-12 ENCOUNTER — Other Ambulatory Visit: Payer: Self-pay

## 2023-06-12 ENCOUNTER — Encounter

## 2023-06-16 ENCOUNTER — Encounter: Payer: Self-pay | Admitting: Acute Care

## 2023-06-16 NOTE — Progress Notes (Signed)
 Lumbar spine x-ray shows mild arthritis at the base of the spine.

## 2023-06-17 ENCOUNTER — Ambulatory Visit: Attending: Family Medicine

## 2023-06-17 DIAGNOSIS — G8929 Other chronic pain: Secondary | ICD-10-CM | POA: Insufficient documentation

## 2023-06-17 DIAGNOSIS — M25511 Pain in right shoulder: Secondary | ICD-10-CM | POA: Insufficient documentation

## 2023-06-17 DIAGNOSIS — M542 Cervicalgia: Secondary | ICD-10-CM | POA: Diagnosis present

## 2023-06-17 DIAGNOSIS — R2689 Other abnormalities of gait and mobility: Secondary | ICD-10-CM | POA: Insufficient documentation

## 2023-06-17 DIAGNOSIS — M5459 Other low back pain: Secondary | ICD-10-CM | POA: Insufficient documentation

## 2023-06-17 DIAGNOSIS — M6281 Muscle weakness (generalized): Secondary | ICD-10-CM | POA: Insufficient documentation

## 2023-06-17 DIAGNOSIS — M25512 Pain in left shoulder: Secondary | ICD-10-CM | POA: Insufficient documentation

## 2023-06-17 NOTE — Therapy (Signed)
 OUTPATIENT PHYSICAL THERAPY TREATMENT/DISCHARGE  PHYSICAL THERAPY DISCHARGE SUMMARY  Visits from Start of Care: 14  Current functional level related to goals / functional outcomes: See goals and objective   Remaining deficits: See goals and objective   Education / Equipment: HEP   Patient agrees to discharge. Patient goals were met. Patient is being discharged due to meeting the stated rehab goals.   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   Subdural hemorrhage (HCC)    Tubular adenoma of colon 11/03/2017   Colonoscopy Sept 9, 2019   Past Surgical History:  Procedure Laterality Date   CARDIAC CATHETERIZATION     CORONARY STENT INTERVENTION N/A 01/30/2021   Procedure: CORONARY STENT INTERVENTION;  Surgeon: Arty Binning, MD;  Location: [facility];  Service: Cardiovascular;  Laterality: N/A;   Examination under anesthesia, repair of anal fissure,  12/04/2000   LEFT HEART CATH AND CORONARY  ANGIOGRAPHY N/A 01/30/2021   Procedure: LEFT HEART CATH AND CORONARY ANGIOGRAPHY;  Surgeon: Arty Binning, MD;  Location: [facility];  Service: Cardiovascular;  Laterality: N/A;   PERIPHERAL VASCULAR CATHETERIZATION Left 02/06/2016   Procedure: Lower Extremity Angiography;  Surgeon: Jackquelyn Mass, MD;  Location: [facility];  Service: Cardiovascular;  Laterality: Left;   PERIPHERAL VASCULAR CATHETERIZATION N/A 02/06/2016   Procedure: Abdominal Aortogram w/Lower Extremity;  Surgeon: Jackquelyn Mass, MD;  Location: [facility];  Service: Cardiovascular;  Laterality: N/A;   PERIPHERAL VASCULAR CATHETERIZATION  02/06/2016   Procedure: Lower Extremity Intervention;  Surgeon: Jackquelyn Mass, MD;  Location: [facility];  Service: Cardiovascular;;   Patient Active Problem List   Diagnosis Date Noted   Trigger finger of left hand 05/15/2023   Cervicalgia 03/21/2023   OSA on CPAP    Snoring 10/15/2022   PAD (peripheral artery disease) (HCC) 10/15/2022   Overweight 09/12/2022   Needs flu shot 09/12/2022   Type 2 myocardial infarction (HCC) 04/14/2022   Nausea, vomiting, and diarrhea 03/29/2022   Preventative health care 03/01/2022   Black stools 01/08/2022   Chronic neck pain 07/12/2021   Chronic pain of both shoulders 07/12/2021   Sensation of fullness in both ears 04/05/2021  History of cocaine use 03/02/2021   CAD in native artery 01/31/2021   S/P angioplasty with stent 01/30/21 to LAD  01/31/2021   Unstable angina (HCC) 01/30/2021   Chest pain    B12 deficiency 09/13/2020   Family history of malignant neoplasm of digestive organs 08/08/2020   Rectal bleeding 08/08/2020   Body mass index (BMI) 27.0-27.9, adult 04/07/2020   Laceration of scalp without complication 03/23/2020   Tubular adenoma of colon 11/03/2017   Type 2 diabetes mellitus (HCC) 06/18/2016   Hepatitis B immune 03/11/2016   Atherosclerosis with claudication of extremity (HCC)  01/29/2016   Arthralgia 10/26/2014   Gastroesophageal reflux disease 06/23/2013   Tobacco use disorder 04/27/2013   Erectile dysfunction 10/27/2012   Insomnia 04/15/2012   Generalized anxiety disorder 02/26/2011    Class: Acute   Suicide attempt (HCC) 02/20/2011   HIV (human immunodeficiency virus infection) (HCC) 11/01/2010   Essential (primary) hypertension 11/01/2010   Hyperlipidemia 11/01/2010    PCP: Zorita Hiss, NP  REFERRING PROVIDER: Syliva Even, MD   REFERRING DIAG:  M54.2 (ICD-10-CM) - Cervicalgia M25.511,G89.29,M25.512 (ICD-10-CM) - Chronic pain of both shoulders M54.50,G89.29 (ICD-10-CM) - Chronic bilateral low back pain without sciatica  Rationale for Evaluation and Treatment: Rehabilitation  THERAPY DIAG:  Cervicalgia  Chronic pain of both shoulders  Muscle weakness (generalized)  Other low back pain  Other abnormalities of gait and mobility  ONSET DATE: Chronic  SUBJECTIVE:                                                                                                                                                                                           SUBJECTIVE STATEMENT: Pt presents to PT with no reports of pain in neck or low back. Has been compliant with HEP. Feels like he   PERTINENT HISTORY:  HTN, subdural hemorrhage   PAIN:  Are you having pain?  Yes: NPRS scale: 1/10 Worst: 10/10 Pain location: neck and UE, L>R Pain description: pain, sore, N/T Aggravating factors: driving, across body reaching, certain cervical movements Relieving factors: medication  Yes: NPRS scale: 0/10 Worst: 10/10 Pain location: lower back Pain description: sore, tight Aggravating factors: prolonged walking Relieving factors: medication  PRECAUTIONS: None  RED FLAGS: None   WEIGHT BEARING RESTRICTIONS: No  FALLS:  Has patient fallen in last 6 months? No  LIVING ENVIRONMENT: Lives with: lives with their family Lives in:  House/apartment  OCCUPATION: Retired  PLOF: Independent  PATIENT GOALS: pt wants to decrease pain in neck/UE and lower back in order to improve comfort with yard work and walk his dog greater than 1/4 mile  NEXT MD VISIT:  06/05/2023  OBJECTIVE:  Note: Objective measures were completed at Evaluation unless otherwise noted.  DIAGNOSTIC FINDINGS:  See imaging   PATIENT SURVEYS:  Modified Oswestry: 18/50 - 36% disability  7/50 - 14% disability NDI: 14/50 - 28% disability  06/17/2023: 0/50 - 0% disability  COGNITION: Overall cognitive status: Within functional limits for tasks assessed     SENSATION: Light touch: Impaired - L UE  POSTURE: rounded shoulders, forward head, and increased lumbar lordosis  PALPATION: TTP to bilateral upper trap  CERVICAL ROM:   AROM eval 05/28/23  Flexion    Extension    Right lateral flexion    Left lateral flexion    Right rotation 45 61  Left rotation 38 61   (Blank rows = not tested)  UPPER EXTREMITY ROM:  AROM Right Eval Left Eval Right 05/28/23 Left 05/28/23  Shoulder flexion 170 125 169 165  Shoulder extension      Shoulder abduction      Shoulder internal rotation      Shoulder external rotation      Elbow flexion      Elbow extension      Wrist flexion      Wrist extension      (Blank rows = not tested)  UPPER EXTREMITY MMT:  MMT Right Eval Left Eval Left   Shoulder flexion     Shoulder abduction     Shoulder IR 4/5 5/5   Shoulder ER 4/5 5/5   Shoulder extension     Upper trap     Middle trap     Lower trap     Elbow flexion      Elbow extension     Wrist flexion     Wrist extension     Grip 70lbs 60lbs    (Blank rows = not tested)  LUMBAR SPECIAL TESTS:  Straight leg raise test: Negative and Slump test: Negative  FUNCTIONAL TESTS:  30 Sec Sit to Stand: 10 reps no UE DNF Endurance: 8 seconds  GAIT: Distance walked: 73ft Assistive device utilized: None Level of assistance: Complete  Independence Comments: trunk flexed  TREATMENT: [facility] Adult PT Treatment:                                                DATE: 06/17/2023 NuStep lvl 5 UE/LE x 5 min for functional activity tolerance Review of HEP, tests/measures, goals and outcomes for discharge Supine chin tuck x 10 - 5" hold Supine PPT x 10 - 5" hold Bridge with blue band 2x10  90/90 2x20" Seated bilateral ER 2x15 RTB Lateral walk RTB x 3 laps at counter Standing hip abd/ext 2x10 RTB  [facility] Adult PT Treatment:                                                DATE: 06/10/2023 NuStep lvl 5 UE/LE x 5 min while taking subjective Slant board calf stretch 2x30 sec with knee bent/ straight STS 2x10 15# KB Supine pilates SLR 3x10 Pilates bridge 3x10 FM row 2x10 20# Pallof press 3x10 10# Standing hip abd 2x10 25# Lateral walk RTB x 3 laps at counter Supine chin tuck 2x10 - 3" hold POE chin tuck x 10 - 3" hold Standing  chin tuck x 10 RTB  [facility] Adult PT Treatment:                                                DATE: 06/03/2023 Bil arch taping to promote arch support and provided information to benefits Treadmill walking 1.6 x 5 min  Slant board calf stretch 2 x 30 sec with knee bent/ straight Standing heel raise with ball squeeze 2 x to fatigue Standing hip abduction with 3# bil to fatigue Alternating marching in place 1 x to fatigue 3#  Mini squat with back against ball on the wall 2 x 12 Updated HEP today and modified visits to 1 x every other week.  [facility] Adult PT Treatment:                                                DATE: 05/30/23 Nu-step L5 x 4 min for functional activity tolerance Supine chin tuck x 10 - 5" hold Supine pilates SLR 2x15 each Pilates bridge 3x10 90/90 2x20"  90/90 alt taps 2x10 STS RTB around knee 5# DB - low table on foam Sustained chin tuck against the wall with ER 2x10 RTB Sustained chin tuck against the wall with horizontal abd 2x10 RTB Standing row 2x10 20# Standing ext 2x10 20# Pallof  press x 10 10#  [facility] Adult PT Treatment:                                                DATE: 05/28/23 MTPR along the bil glute med x 2 Nu-step L5 x 5 in LE only Sustained chin tuck against the wall while doing horizontal abduction and diagonals with RTB Standing on LLE with contralateral Heel strike/ toe off 2 x 15, repeated on the RLE Sit to stand with RTB around knees 2 x 10 Updated HEP today  PATIENT EDUCATION:  Education details: HEP update Person educated: Patient Education method: Explanation, Demonstration, and Handouts Education comprehension: verbalized understanding and returned demonstration  HOME EXERCISE PROGRAM: Access Code: [email or web address] URL: [email or web address] Date: 06/10/2023 Prepared by: Loral Roch  Exercises - Seated Upper Trapezius Stretch  - 1 x daily - 7 x weekly - 2 sets - 2 reps - 30 seconds hold - Standing Shoulder Row with Anchored Resistance  - 1 x daily - 7 x weekly - 3 sets - 10 reps - blue band hold - Supine Chin Tuck  - 1 x daily - 7 x weekly - 2 sets - 10 reps - Supine Deep Neck Flexor Training - Repetitions  - 1 x daily - 7 x weekly - 2 sets - 10 reps - 10-20 seconds hold - Shoulder External Rotation and Scapular Retraction with Resistance  - 1 x daily - 7 x weekly - 3 sets - 10 reps - red band hold - Supine Posterior Pelvic Tilt  - 1 x daily - 7 x weekly - 2 sets - 20 reps - holding 10 sec hold - Supine March with Posterior Pelvic Tilt  - 1 x daily - 7 x weekly - 2 sets - 10 reps - Seated  Anterior Pelvic Tilt  - 1 x daily - 7 x weekly - 2 sets - 10 reps - 5 seconds hold - Lower Trunk Rotation  - 1 x daily - 7 x weekly - 2 sets - 10 reps - Isometric Dead Bug  - 1 x daily - 7 x weekly - 2 sets - 10 reps - holding 10 seconds hold - Standing Anti-Rotation Press with Anchored Resistance  - 1 x daily - 7 x weekly - 2 sets - 10 reps - 1 sec hold - Supine Bridge with Resistance Band  - 1 x daily - 7 x weekly - 2 sets - 10 reps - blue band  hold - Sidelying Hip Abduction  - 1 x daily - 7 x weekly - 2 sets - 10 reps - Sit to Stand with Resistance Around Legs  - 1 x daily - 7 x weekly - 2 sets - 10 reps - Standing Calf Raise With Small Ball at Heels  - 1 x daily - 7 x weekly - 2 sets - 10 reps - Standing Bilateral Gastroc Stretch with Step  - 1 x daily - 7 x weekly - 2 sets - 10 reps - Side Stepping with Resistance at Ankles and Counter Support  - 4 x weekly - 3 sets - red band hold - Standing Hip Abduction with Resistance at Ankles and Counter Support  - 4 x weekly - 2 sets - 10 reps - red band hold  ASSESSMENT:  CLINICAL IMPRESSION: Pt was able to complete all prescribed exercises with no adverse effect and demonstrated knowledge of HEP. Over the course of PT treatment he has progressed very well showing improved strength and subjective decrease in pain. Also subjectively notes improved scores on NDI and ODI and has meet all goals with therapy. He should continue to improve with HEP compliance and is being discharged from skilled services at this time.   Evaluation: Patient is a 63 y.o. M who was seen today for physical therapy evaluation and treatment for chronic neck and LBP. Physical findings are consistent with physician impression as pt demonstrates decrease in cervical ROM as well as postural and hip/core strength deficits. ODI and NDI scores suggest minimal/moderate disability in performance of home ADLs and community level activity, indicating he is operating well below PLOF. Pt would benefit from skilled PT services working on improving strength of postural muscles and multimodal approach of manual and exercise to decrease pain and improve function.    OBJECTIVE IMPAIRMENTS: decreased activity tolerance, decreased endurance, decreased mobility, difficulty walking, decreased ROM, decreased strength, and pain  ACTIVITY LIMITATIONS: carrying, lifting, sitting, standing, squatting, stairs, reach over head, and locomotion  level  PARTICIPATION LIMITATIONS: driving, shopping, community activity, occupation, and yard work  PERSONAL FACTORS: Time since onset of injury/illness/exacerbation and 1-2 comorbidities: HTN, subdural hemorrhage are also affecting patient's functional outcome.   REHAB POTENTIAL: Good  CLINICAL DECISION MAKING: Evolving/moderate complexity  EVALUATION COMPLEXITY: Moderate   GOALS: Goals reviewed with patient? No  SHORT TERM GOALS: Target date: 05/14/2023   Pt will be compliant and knowledgeable with initial HEP for improved comfort and carryover Baseline: initial HEP given  Goal status: MET  2.  Pt will self report neck/UE and lower back pain no greater than 7/10 for improved comfort and functional ability Baseline: 10/10 at worst 05/22/2023: 9/10 at worst Goal status: MET   LONG TERM GOALS: Target date: 06/18/2023   Pt will be decrease ODI disability score to no greater than 20%  disability as proxy for functional improvement with home ADLs and community activites Baseline: 18/50 - 36% disability 06/17/2023: 7/50 - 14% disability Goal status: MET  2.  Pt will decrease NDI disability score to no greater than 20% disability as proxy for functional improvement with ADLs and community activities Baseline: 28% disability 06/17/2023: 0/50 - 0% disability Goal status: MET   3.  Pt will increase 30 Second Sit to Stand rep count to no less than 12 reps for improved balance, strength, and functional mobility Baseline: 10 reps  06/17/2023: 14 reps Goal status: MET   4.  Pt will improve walking distance to at least 1 mile while walking his dog with decreased LBP for improved comfort with desired recreational activity Baseline: 1/4 mile Goal status: MOSTLY MET  5.  Pt will improve bilateral cervical rotation to no less than 60 degrees for improved comfort and functional ability with driving Baseline: see ROM chart Goal status: MET  6.  Pt will improve L grip strength to at least 70lbs  for improved functional ability with grasp and ADL tasks Baseline: see MMT chart Goal status: MET  PLAN:  PT FREQUENCY: 2x/week  PT DURATION: 8 weeks  PLANNED INTERVENTIONS: 97164- PT Re-evaluation, 97110-Therapeutic exercises, 97530- Therapeutic activity, [id number]- Neuromuscular re-education, 97535- Self Care, 16109- Manual therapy, [id number]- Gait training, [id number]- Aquatic Therapy, U0454- Electrical stimulation (unattended), [id number]- Electrical stimulation (manual), 97016- Vasopneumatic device, Dry Needling, Cryotherapy, and Moist heat.  PLAN FOR NEXT SESSION: progressive HEP PRN , periscapular/DNF, hip/core strength.   Ivor Mars PT  06/17/23 1:03 PM

## 2023-06-19 ENCOUNTER — Encounter

## 2023-06-25 ENCOUNTER — Other Ambulatory Visit: Payer: Self-pay

## 2023-06-25 NOTE — Progress Notes (Signed)
 Specialty Pharmacy Ongoing Clinical Assessment Note  Jacob Ali is a 63 y.o. male who is being followed by the specialty pharmacy service for RxSp HIV   Patient's specialty medication(s) reviewed today: Bictegravir-Emtricitab-Tenofov (Biktarvy )   Missed doses in the last 4 weeks: 0   Patient/Caregiver did not have any additional questions or concerns.   Therapeutic benefit summary: Patient is NOT achieving benefit   Adverse events/side effects summary: No adverse events/side effects   Patient's therapy is appropriate to: Continue    Goals Addressed             This Visit's Progress    Achieve Undetectable HIV Viral Load < 20   No change    Patient is not on track and worsening. Patient will maintain adherence.  Most recent viral load was 150 copies/mL, previously had been undetectable.          Follow up: 6 months  [facility]

## 2023-07-01 ENCOUNTER — Other Ambulatory Visit: Payer: Self-pay | Admitting: Nurse Practitioner

## 2023-07-01 DIAGNOSIS — E1165 Type 2 diabetes mellitus with hyperglycemia: Secondary | ICD-10-CM

## 2023-07-08 ENCOUNTER — Other Ambulatory Visit: Payer: Self-pay | Admitting: Infectious Diseases

## 2023-07-08 ENCOUNTER — Other Ambulatory Visit (HOSPITAL_COMMUNITY): Payer: Self-pay

## 2023-07-08 DIAGNOSIS — Z21 Asymptomatic human immunodeficiency virus [HIV] infection status: Secondary | ICD-10-CM

## 2023-07-09 ENCOUNTER — Other Ambulatory Visit: Payer: Self-pay

## 2023-07-09 ENCOUNTER — Encounter (INDEPENDENT_AMBULATORY_CARE_PROVIDER_SITE_OTHER): Payer: Self-pay

## 2023-07-09 MED ORDER — BIKTARVY 50-200-25 MG PO TABS
1.0000 | ORAL_TABLET | Freq: Every day | ORAL | 6 refills | Status: DC
  Filled 2023-07-09: qty 30, 30d supply, fill #0
  Filled 2023-08-05: qty 30, 30d supply, fill #1
  Filled 2023-08-25: qty 30, 30d supply, fill #2
  Filled 2023-10-07: qty 30, 30d supply, fill #3
  Filled 2023-11-03: qty 30, 30d supply, fill #4
  Filled 2023-12-04: qty 30, 30d supply, fill #5
  Filled 2024-01-02: qty 30, 30d supply, fill #6

## 2023-07-09 NOTE — Progress Notes (Signed)
 Specialty Pharmacy Refill Coordination Note  Jacob Ali is a 63 y.o. male contacted today regarding refills of specialty medication(s) Bictegravir-Emtricitab-Tenofov (Biktarvy )   Patient requested (Patient-Rptd) Delivery   Delivery date: 07/10/23   Verified address: (Patient-Rptd) [address] [place] [address]   Medication will be filled on 06.25.25.

## 2023-07-10 ENCOUNTER — Other Ambulatory Visit: Payer: Self-pay

## 2023-07-21 ENCOUNTER — Ambulatory Visit: Admitting: Orthopedic Surgery

## 2023-07-31 ENCOUNTER — Telehealth: Payer: Self-pay | Admitting: Cardiology

## 2023-07-31 MED ORDER — LISINOPRIL 20 MG PO TABS: 20.0000 mg | ORAL_TABLET | Freq: Every day | ORAL | 3 refills | Status: AC

## 2023-07-31 NOTE — Telephone Encounter (Signed)
*  STAT* If patient is at the pharmacy, call can be transferred to refill team.   1. Which medications need to be refilled? (please list name of each medication and dose if known)  Lisiopril   2. Would you like to learn more about the convenience, safety, & potential cost savings by using the [facility]?     3. Are you open to using the [facility] (Type [facility].   4. Which pharmacy/location (including street and city if local pharmacy) is medication to be sent to? [facility]  [address],[place]   5. Do they need a 30 day or 90 day supply? 90 days and refills

## 2023-07-31 NOTE — Telephone Encounter (Signed)
 [facility]. No other needs voiced at this time, patient expressed appreciation for call.

## 2023-08-05 ENCOUNTER — Other Ambulatory Visit: Payer: Self-pay

## 2023-08-05 ENCOUNTER — Encounter (INDEPENDENT_AMBULATORY_CARE_PROVIDER_SITE_OTHER): Payer: Self-pay

## 2023-08-05 NOTE — Progress Notes (Signed)
 Specialty Pharmacy Refill Coordination Note  WHITMAN MEINHARDT is a 63 y.o. male contacted today regarding refills of specialty medication(s) Bictegravir-Emtricitab-Tenofov (Biktarvy )   Patient requested (Patient-Rptd) Delivery   Delivery date: 08/06/23   Verified address: (Patient-Rptd) [address] [place] [address]   Medication will be filled on 07.22.25.

## 2023-08-19 ENCOUNTER — Other Ambulatory Visit: Payer: Self-pay | Admitting: Nurse Practitioner

## 2023-08-19 DIAGNOSIS — K219 Gastro-esophageal reflux disease without esophagitis: Secondary | ICD-10-CM

## 2023-08-25 ENCOUNTER — Encounter (INDEPENDENT_AMBULATORY_CARE_PROVIDER_SITE_OTHER): Payer: Self-pay

## 2023-08-25 ENCOUNTER — Other Ambulatory Visit: Payer: Self-pay

## 2023-08-25 ENCOUNTER — Other Ambulatory Visit (HOSPITAL_COMMUNITY): Payer: Self-pay

## 2023-08-25 NOTE — Progress Notes (Signed)
 Specialty Pharmacy Refill Coordination Note  MyChart Questionnaire Submission  Jacob Ali is a 63 y.o. male contacted today regarding refills of specialty medication(s) Biktarvy .  Doses on hand: (Patient-Rptd) 23   Patient requested: (Patient-Rptd) Delivery   Delivery date: 08/29/23   Verified address: [address] [place] [address]  Medication will be filled on 08/28/23.

## 2023-10-07 ENCOUNTER — Encounter (INDEPENDENT_AMBULATORY_CARE_PROVIDER_SITE_OTHER): Payer: Self-pay

## 2023-10-07 ENCOUNTER — Other Ambulatory Visit: Payer: Self-pay

## 2023-10-07 NOTE — Progress Notes (Signed)
 Specialty Pharmacy Refill Coordination Note  Jacob Ali is a 63 y.o. male contacted today regarding refills of specialty medication(s) Bictegravir-Emtricitab-Tenofov (Biktarvy )   Patient requested (Patient-Rptd) Delivery   Delivery date: 10/13/23   Verified address: (Patient-Rptd) [address] [place]. [place]  [address]   Medication will be filled on 10/10/23.

## 2023-10-09 ENCOUNTER — Other Ambulatory Visit: Payer: Self-pay

## 2023-10-12 ENCOUNTER — Other Ambulatory Visit: Payer: Self-pay | Admitting: Nurse Practitioner

## 2023-10-12 DIAGNOSIS — E1165 Type 2 diabetes mellitus with hyperglycemia: Secondary | ICD-10-CM

## 2023-10-29 ENCOUNTER — Other Ambulatory Visit: Payer: Self-pay

## 2023-10-29 ENCOUNTER — Telehealth: Payer: Self-pay

## 2023-10-29 NOTE — Telephone Encounter (Signed)
 This patient will have new insurance starting November. He advised that his new plan has told him his copay for Biktarvy  will be $1k or higher. Can you start looking into PAP for him? Thanks!

## 2023-10-30 ENCOUNTER — Other Ambulatory Visit (HOSPITAL_COMMUNITY): Payer: Self-pay

## 2023-10-31 ENCOUNTER — Encounter (INDEPENDENT_AMBULATORY_CARE_PROVIDER_SITE_OTHER): Payer: Self-pay

## 2023-10-31 ENCOUNTER — Other Ambulatory Visit: Payer: Self-pay

## 2023-11-03 ENCOUNTER — Other Ambulatory Visit: Payer: Self-pay

## 2023-11-03 NOTE — Progress Notes (Signed)
 Specialty Pharmacy Refill Coordination Note  Jacob Ali is a 63 y.o. male contacted today regarding refills of specialty medication(s) Bictegravir-Emtricitab-Tenofov (Biktarvy )   Patient requested Delivery   Delivery date: 11/11/23   Verified address: [address] [place] [address]   Medication will be filled on 10.27.25.

## 2023-11-08 ENCOUNTER — Other Ambulatory Visit: Payer: Self-pay | Admitting: Nurse Practitioner

## 2023-11-08 DIAGNOSIS — E785 Hyperlipidemia, unspecified: Secondary | ICD-10-CM

## 2023-11-10 ENCOUNTER — Other Ambulatory Visit: Payer: Self-pay

## 2023-11-17 ENCOUNTER — Encounter: Payer: Self-pay | Admitting: Radiology

## 2023-11-20 ENCOUNTER — Ambulatory Visit: Admitting: Nurse Practitioner

## 2023-12-04 ENCOUNTER — Other Ambulatory Visit (HOSPITAL_COMMUNITY): Payer: Self-pay

## 2023-12-04 ENCOUNTER — Other Ambulatory Visit: Payer: Self-pay

## 2023-12-04 NOTE — Progress Notes (Signed)
 Specialty Pharmacy Refill Coordination Note  Jacob Ali is a 63 y.o. male contacted today regarding refills of specialty medication(s) Bictegravir-Emtricitab-Tenofov (Biktarvy )   Patient requested (Patient-Rptd) Delivery   Delivery date: 12/12/23   Verified address: (Patient-Rptd) [address] [place], [place] [address]   Medication will be filled on: 12/10/23

## 2023-12-09 ENCOUNTER — Encounter: Payer: Self-pay | Admitting: Infectious Diseases

## 2023-12-09 ENCOUNTER — Ambulatory Visit: Payer: Self-pay | Admitting: Infectious Diseases

## 2023-12-09 ENCOUNTER — Other Ambulatory Visit (HOSPITAL_COMMUNITY)
Admission: RE | Admit: 2023-12-09 | Discharge: 2023-12-09 | Disposition: A | Discharge status: Home / Self Care | Source: Ambulatory Visit | Attending: Infectious Diseases | Admitting: Infectious Diseases

## 2023-12-09 VITALS — BP 131/81 | HR 76 | Ht 69.0 in | Wt 182.4 lb

## 2023-12-09 DIAGNOSIS — Z113 Encounter for screening for infections with a predominantly sexual mode of transmission: Secondary | ICD-10-CM

## 2023-12-09 DIAGNOSIS — E785 Hyperlipidemia, unspecified: Secondary | ICD-10-CM

## 2023-12-09 DIAGNOSIS — Z95820 Peripheral vascular angioplasty status with implants and grafts: Secondary | ICD-10-CM

## 2023-12-09 DIAGNOSIS — G4700 Insomnia, unspecified: Secondary | ICD-10-CM | POA: Diagnosis not present

## 2023-12-09 DIAGNOSIS — E782 Mixed hyperlipidemia: Secondary | ICD-10-CM

## 2023-12-09 DIAGNOSIS — Z21 Asymptomatic human immunodeficiency virus [HIV] infection status: Secondary | ICD-10-CM | POA: Diagnosis not present

## 2023-12-09 DIAGNOSIS — I70219 Atherosclerosis of native arteries of extremities with intermittent claudication, unspecified extremity: Secondary | ICD-10-CM | POA: Diagnosis not present

## 2023-12-09 DIAGNOSIS — Z79899 Other long term (current) drug therapy: Secondary | ICD-10-CM

## 2023-12-09 DIAGNOSIS — F17290 Nicotine dependence, other tobacco product, uncomplicated: Secondary | ICD-10-CM

## 2023-12-09 DIAGNOSIS — Z23 Encounter for immunization: Secondary | ICD-10-CM | POA: Diagnosis not present

## 2023-12-09 DIAGNOSIS — F17200 Nicotine dependence, unspecified, uncomplicated: Secondary | ICD-10-CM

## 2023-12-09 NOTE — Assessment & Plan Note (Addendum)
 He needs labs today PCV and flu today COVID at pharmacy. Has gotten RSV.  Committed partner Handicap parking permit Will see him in 9 months.

## 2023-12-09 NOTE — Assessment & Plan Note (Signed)
 His pulses are normal.

## 2023-12-09 NOTE — Progress Notes (Signed)
 Subjective:    Patient ID: Jacob Ali, male  DOB: 1960-06-02, 63 y.o.        MRN: [id number]   HPI 64 y.o. M HIV+ September 2012.  Hx of HTN, hyperlipidemia, hospitalization for tylenol  overdose and cutting of wrists on 02/20/11.  He underwent atherectomy (01-2016) L SFA, PTCA and stent of L SFA and popliteal arteries; and PTCA and stent of R common iliac.  Duplex ultrasound of the left leg shows moderat restenosis of the stent in the SFA >50%. He states his f/u since have been good.  Prev taking tivicay -descovy. Changed to biktarvy  11-2016. Alexa tells him to take his pills every morning at 7:30am.    On crestor /fibrate for hyperlipidemia (lipitor gave him myalgias).  He was hospitalized in Jan 2023 and underwent stent of LAD. He continues to have CV f/u.     His A1C is 4.2 (08-2022).  Is going to make ophtho appt next month.    Smokes cigarillos, more now that he is home, off work .     Mother died in 12-Jun-2025had become bed-ridden since January.    He had to put his dog down. And then got new dog that he fell over and cracked 2 ribs. Took time off work for this.  He then got rotavirus (March 2024, by day 3 he called ambulance. He had severe abd pain then was taken to [facility] by his sister.    Was in hospital for 5 days, ARF. He also had CV event due to this stress as well.  He had multiple HR issues when he came back and so he decided to retire. He had a friend move in who has been a great support.   Has been taking unasom for sleep since trazadone quit working. He has questions about Mg+, taking glycenate.   Unasom does not give him hangover, constipation or urinary hesitancy.  He has also been eval for sleep apnea (SpO2 to 80s while sleeping). CPAP started this year and is doing well with this. Can't sleep without it.  New dog is good company, she is on pancreatitis diet.   Is engaged!  No problems with ART. Biktarvy .  Worries he is not active enough. Is going to join  [employer] sne[employer], will get reimbursed by his insurance. Has been cooking a lot. Has been using treadmill as well.   HIV 1 RNA Quant  Date Value  11/14/2020 Not Detected Copies/mL  11/03/2019 29 Copies/mL (H)  11/17/2018 <20 NOT DETECTED copies/mL   HIV-1 RNA Viral Load (copies/mL)  Date Value  11/19/2022 150  11/07/2021 <20   CD4 (no units)  Date Value  11/24/2015 1,130  05/17/2015 1,550  09/30/2014 952   CD4 T Cell Abs (/uL)  Date Value  11/19/2022 1,641  11/07/2021 1,866 (H)  11/14/2020 1,456   Lipid Panel     Component Value Date/Time   CHOL 101 09/13/2022 0828   CHOL 93 (L) 01/02/2022 0726   TRIG 175.0 (H) 09/13/2022 0828   HDL 29.10 (L) 09/13/2022 0828   HDL 25 (L) 01/02/2022 0726   CHOLHDL 3 09/13/2022 0828   VLDL 35.0 09/13/2022 0828   LDLCALC 36 09/13/2022 0828   LDLCALC 41 03/01/2022 1449   LDLDIRECT 98.0 01/24/2021 0954   LABVLDL 24 01/02/2022 0726     Health Maintenance  Topic Date Due  . Medicare Annual Wellness (AWV)  Never done  . OPHTHALMOLOGY EXAM  Never done  . Hepatitis B Vaccines 19-59 Average Risk (  1 of 3 - Risk 3-dose series) Never done  . FOOT EXAM  05/30/2021  . Diabetic kidney evaluation - Urine ACR  03/02/2023  . Influenza Vaccine  08/15/2023  . COVID-19 Vaccine (7 - 2025-26 season) 09/15/2023  . Pneumococcal Vaccine: 50+ Years (4 of 4 - PCV20 or PCV21) 11/17/2023  . HEMOGLOBIN A1C  11/15/2023  . Lung Cancer Screening  03/09/2024  . Diabetic kidney evaluation - eGFR measurement  05/14/2024  . Colonoscopy  09/23/2027  . DTaP/Tdap/Td (3 - Td or Tdap) 03/17/2030  . Hepatitis C Screening  Completed  . HIV Screening  Completed  . Zoster Vaccines- Shingrix  Completed  . Meningococcal B Vaccine  Aged Out      Review of Systems  Constitutional:  Negative for chills, fever and weight loss.  Respiratory:  Negative for cough and shortness of breath.   Gastrointestinal:  Negative for constipation and diarrhea.  Genitourinary:  Negative  for dysuria.    Please see HPI. All other systems reviewed and negative.     Objective:  Physical Exam Vitals reviewed.  Constitutional:      General: He is not in acute distress.    Appearance: Normal appearance. He is normal weight. He is not ill-appearing.  HENT:     Mouth/Throat:     Mouth: Mucous membranes are moist.     Pharynx: No oropharyngeal exudate.  Eyes:     Extraocular Movements: Extraocular movements intact.     Pupils: Pupils are equal, round, and reactive to light.  Cardiovascular:     Rate and Rhythm: Normal rate and regular rhythm.     Pulses:          Dorsalis pedis pulses are 3+ on the right side and 3+ on the left side.       Posterior tibial pulses are 3+ on the right side and 3+ on the left side.  Pulmonary:     Effort: Pulmonary effort is normal.     Breath sounds: Normal breath sounds.  Abdominal:     General: Bowel sounds are normal. There is no distension.     Palpations: Abdomen is soft.     Tenderness: There is no abdominal tenderness.  Musculoskeletal:        General: Normal range of motion.     Cervical back: Normal range of motion. No rigidity.     Right lower leg: No edema.     Left lower leg: No edema.  Lymphadenopathy:     Cervical: No cervical adenopathy.  Neurological:     General: No focal deficit present.     Mental Status: He is alert.  Psychiatric:        Mood and Affect: Mood normal.           Assessment & Plan:

## 2023-12-09 NOTE — Assessment & Plan Note (Signed)
 Lipid Panel     Component Value Date/Time   CHOL 101 09/13/2022 0828   CHOL 93 (L) 01/02/2022 0726   TRIG 175.0 (H) 09/13/2022 0828   HDL 29.10 (L) 09/13/2022 0828   HDL 25 (L) 01/02/2022 0726   CHOLHDL 3 09/13/2022 0828   VLDL 35.0 09/13/2022 0828   LDLCALC 36 09/13/2022 0828   LDLCALC 41 03/01/2022 1449   LDLDIRECT 98.0 01/24/2021 0954   LABVLDL 24 01/02/2022 0726      Latest Ref Rng & Units 11/19/2022    4:15 PM 09/13/2022    8:28 AM 04/10/2022   10:25 AM  Hepatic Function  Total Protein 6.0 - 8.5 g/dL 6.9  7.2  6.6   Albumin 3.9 - 4.9 g/dL 4.8  4.8  4.4   AST 0 - 40 IU/L 20  21  17    ALT 0 - 44 IU/L 19  21  19    Alk Phosphatase 44 - 121 IU/L 50  40  56   Total Bilirubin 0.0 - 1.2 mg/dL 0.5  0.8  0.2     Doing well

## 2023-12-09 NOTE — Assessment & Plan Note (Signed)
 Encouraged to quit.

## 2023-12-09 NOTE — Assessment & Plan Note (Signed)
 Will continue unasom.  Cautioned about side effects- constipation, urinary hesitancy, hangover.

## 2023-12-10 LAB — URINE CYTOLOGY ANCILLARY ONLY
Chlamydia: NEGATIVE
Comment: NEGATIVE
Comment: NORMAL
Neisseria Gonorrhea: NEGATIVE

## 2023-12-11 LAB — COMPREHENSIVE METABOLIC PANEL WITH GFR
ALT: 13 IU/L (ref 0–44)
AST: 18 IU/L (ref 0–40)
Albumin: 4.9 g/dL (ref 3.9–4.9)
Alkaline Phosphatase: 49 IU/L (ref 47–123)
BUN/Creatinine Ratio: 13 (ref 10–24)
BUN: 13 mg/dL (ref 8–27)
Bilirubin Total: 0.7 mg/dL (ref 0.0–1.2)
CO2: 20 mmol/L (ref 20–29)
Calcium: 9.5 mg/dL (ref 8.6–10.2)
Chloride: 106 mmol/L (ref 96–106)
Creatinine, Ser: 1.04 mg/dL (ref 0.76–1.27)
Globulin, Total: 1.9 g/dL (ref 1.5–4.5)
Glucose: 80 mg/dL (ref 70–99)
Potassium: 4.3 mmol/L (ref 3.5–5.2)
Sodium: 140 mmol/L (ref 134–144)
Total Protein: 6.8 g/dL (ref 6.0–8.5)
eGFR: 80 mL/min/1.73 (ref >59)

## 2023-12-11 LAB — CBC
Hematocrit: 39.9 % (ref 37.5–51.0)
Hemoglobin: 13.3 g/dL (ref 13.0–17.7)
MCH: 33.8 pg — ABNORMAL HIGH (ref 26.6–33.0)
MCHC: 33.3 g/dL (ref 31.5–35.7)
MCV: 101 fL — ABNORMAL HIGH (ref 79–97)
Platelets: 203 x10E3/uL (ref 150–450)
RBC: 3.94 x10E6/uL — ABNORMAL LOW (ref 4.14–5.80)
RDW: 12.3 % (ref 11.6–15.4)
WBC: 9.4 x10E3/uL (ref 3.4–10.8)

## 2023-12-11 LAB — LIPID PANEL
Chol/HDL Ratio: 3.7 ratio (ref 0.0–5.0)
Cholesterol, Total: 107 mg/dL (ref 100–199)
HDL: 29 mg/dL — ABNORMAL LOW (ref >39)
LDL Chol Calc (NIH): 40 mg/dL (ref 0–99)
Triglycerides: 246 mg/dL — ABNORMAL HIGH (ref 0–149)
VLDL Cholesterol Cal: 38 mg/dL (ref 5–40)

## 2023-12-11 LAB — HIV-1 RNA QUANT-NO REFLEX-BLD: HIV-1 RNA Viral Load: <20 {copies}/mL

## 2023-12-11 LAB — SYPHILIS: RPR W/REFLEX TO RPR TITER AND TREPONEMAL ANTIBODIES, TRADITIONAL SCREENING AND DIAGNOSIS ALGORITHM: RPR Ser Ql: NONREACTIVE

## 2023-12-12 LAB — T-HELPER CELLS (CD4) COUNT (NOT AT ARMC)
CD4 % Helper T Cell: 42 % (ref 33–65)
CD4 T Cell Abs: 1880 /uL — ABNORMAL HIGH (ref 400–1790)

## 2023-12-16 DIAGNOSIS — K080 Exfoliation of teeth due to systemic causes: Secondary | ICD-10-CM | POA: Diagnosis not present

## 2023-12-24 LAB — OPHTHALMOLOGY REPORT-SCANNED

## 2023-12-31 DIAGNOSIS — K080 Exfoliation of teeth due to systemic causes: Secondary | ICD-10-CM | POA: Diagnosis not present

## 2024-01-02 ENCOUNTER — Other Ambulatory Visit: Payer: Self-pay

## 2024-01-05 ENCOUNTER — Other Ambulatory Visit: Payer: Self-pay

## 2024-01-05 NOTE — Progress Notes (Signed)
 Specialty Pharmacy Refill Coordination Note  TRAVARIS KOSH is a 63 y.o. male contacted today regarding refills of specialty medication(s) Bictegravir-Emtricitab-Tenofov (Biktarvy )   Patient requested Delivery   Delivery date: 01/13/24   Verified address: [address] [place] [address]   Medication will be filled on: 01/12/24

## 2024-01-06 ENCOUNTER — Other Ambulatory Visit: Payer: Self-pay | Admitting: Nurse Practitioner

## 2024-01-06 DIAGNOSIS — E785 Hyperlipidemia, unspecified: Secondary | ICD-10-CM

## 2024-01-06 DIAGNOSIS — E1165 Type 2 diabetes mellitus with hyperglycemia: Secondary | ICD-10-CM

## 2024-01-06 DIAGNOSIS — K219 Gastro-esophageal reflux disease without esophagitis: Secondary | ICD-10-CM

## 2024-01-12 ENCOUNTER — Other Ambulatory Visit: Payer: Self-pay

## 2024-01-14 ENCOUNTER — Other Ambulatory Visit: Payer: Self-pay

## 2024-01-14 ENCOUNTER — Encounter (HOSPITAL_COMMUNITY): Payer: Self-pay

## 2024-01-14 ENCOUNTER — Inpatient Hospital Stay (HOSPITAL_COMMUNITY)
Admission: EM | Admit: 2024-01-14 | Discharge: 2024-01-16 | DRG: 193 | Disposition: A | Discharge status: Home / Self Care | Source: Ambulatory Visit

## 2024-01-14 ENCOUNTER — Encounter (HOSPITAL_COMMUNITY): Payer: Self-pay | Admitting: *Deleted

## 2024-01-14 ENCOUNTER — Emergency Department (HOSPITAL_COMMUNITY)

## 2024-01-14 ENCOUNTER — Ambulatory Visit (HOSPITAL_COMMUNITY): Admission: EM | Admit: 2024-01-14 | Discharge: 2024-01-14 | Disposition: A | Discharge status: Home / Self Care

## 2024-01-14 DIAGNOSIS — Z888 Allergy status to other drugs, medicaments and biological substances status: Secondary | ICD-10-CM

## 2024-01-14 DIAGNOSIS — Z825 Family history of asthma and other chronic lower respiratory diseases: Secondary | ICD-10-CM

## 2024-01-14 DIAGNOSIS — R0902 Hypoxemia: Secondary | ICD-10-CM

## 2024-01-14 DIAGNOSIS — Z1152 Encounter for screening for COVID-19: Secondary | ICD-10-CM

## 2024-01-14 DIAGNOSIS — J101 Influenza due to other identified influenza virus with other respiratory manifestations: Principal | ICD-10-CM | POA: Diagnosis present

## 2024-01-14 DIAGNOSIS — J9601 Acute respiratory failure with hypoxia: Secondary | ICD-10-CM | POA: Diagnosis present

## 2024-01-14 DIAGNOSIS — Z8249 Family history of ischemic heart disease and other diseases of the circulatory system: Secondary | ICD-10-CM

## 2024-01-14 DIAGNOSIS — Z7902 Long term (current) use of antithrombotics/antiplatelets: Secondary | ICD-10-CM

## 2024-01-14 DIAGNOSIS — Z79899 Other long term (current) drug therapy: Secondary | ICD-10-CM

## 2024-01-14 DIAGNOSIS — I10 Essential (primary) hypertension: Secondary | ICD-10-CM | POA: Diagnosis present

## 2024-01-14 DIAGNOSIS — R0682 Tachypnea, not elsewhere classified: Secondary | ICD-10-CM | POA: Diagnosis not present

## 2024-01-14 DIAGNOSIS — R509 Fever, unspecified: Secondary | ICD-10-CM

## 2024-01-14 DIAGNOSIS — Z808 Family history of malignant neoplasm of other organs or systems: Secondary | ICD-10-CM

## 2024-01-14 DIAGNOSIS — R7981 Abnormal blood-gas level: Secondary | ICD-10-CM

## 2024-01-14 DIAGNOSIS — N179 Acute kidney failure, unspecified: Secondary | ICD-10-CM | POA: Diagnosis present

## 2024-01-14 DIAGNOSIS — E785 Hyperlipidemia, unspecified: Secondary | ICD-10-CM | POA: Diagnosis present

## 2024-01-14 DIAGNOSIS — Z955 Presence of coronary angioplasty implant and graft: Secondary | ICD-10-CM

## 2024-01-14 DIAGNOSIS — F17290 Nicotine dependence, other tobacco product, uncomplicated: Secondary | ICD-10-CM | POA: Diagnosis present

## 2024-01-14 DIAGNOSIS — G4733 Obstructive sleep apnea (adult) (pediatric): Secondary | ICD-10-CM | POA: Diagnosis present

## 2024-01-14 DIAGNOSIS — J111 Influenza due to unidentified influenza virus with other respiratory manifestations: Principal | ICD-10-CM

## 2024-01-14 DIAGNOSIS — E1151 Type 2 diabetes mellitus with diabetic peripheral angiopathy without gangrene: Secondary | ICD-10-CM | POA: Diagnosis present

## 2024-01-14 DIAGNOSIS — K219 Gastro-esophageal reflux disease without esophagitis: Secondary | ICD-10-CM | POA: Diagnosis present

## 2024-01-14 DIAGNOSIS — I2510 Atherosclerotic heart disease of native coronary artery without angina pectoris: Secondary | ICD-10-CM | POA: Diagnosis present

## 2024-01-14 DIAGNOSIS — I959 Hypotension, unspecified: Secondary | ICD-10-CM | POA: Diagnosis present

## 2024-01-14 DIAGNOSIS — Z7984 Long term (current) use of oral hypoglycemic drugs: Secondary | ICD-10-CM

## 2024-01-14 DIAGNOSIS — E860 Dehydration: Secondary | ICD-10-CM | POA: Diagnosis present

## 2024-01-14 DIAGNOSIS — Z860101 Personal history of adenomatous and serrated colon polyps: Secondary | ICD-10-CM

## 2024-01-14 LAB — CBC WITH DIFFERENTIAL/PLATELET
Abs Immature Granulocytes: 0.04 K/uL (ref 0.00–0.07)
Basophils Absolute: 0.1 K/uL (ref 0.0–0.1)
Basophils Relative: 1 %
Eosinophils Absolute: 0.1 K/uL (ref 0.0–0.5)
Eosinophils Relative: 1 %
HCT: 39.8 % (ref 39.0–52.0)
Hemoglobin: 13.2 g/dL (ref 13.0–17.0)
Immature Granulocytes: 0 %
Lymphocytes Relative: 15 %
Lymphs Abs: 1.5 K/uL (ref 0.7–4.0)
MCH: 34.8 pg — ABNORMAL HIGH (ref 26.0–34.0)
MCHC: 33.2 g/dL (ref 30.0–36.0)
MCV: 105 fL — ABNORMAL HIGH (ref 80.0–100.0)
Monocytes Absolute: 1.2 K/uL — ABNORMAL HIGH (ref 0.1–1.0)
Monocytes Relative: 12 %
Neutro Abs: 6.8 K/uL (ref 1.7–7.7)
Neutrophils Relative %: 71 %
Platelets: 158 K/uL (ref 150–400)
RBC: 3.79 MIL/uL — ABNORMAL LOW (ref 4.22–5.81)
RDW: 13.2 % (ref 11.5–15.5)
WBC: 9.6 K/uL (ref 4.0–10.5)
nRBC: 0 % (ref 0.0–0.2)

## 2024-01-14 LAB — URINALYSIS, W/ REFLEX TO CULTURE (INFECTION SUSPECTED)
Bacteria, UA: NONE SEEN
Bilirubin Urine: NEGATIVE
Glucose, UA: >=500 mg/dL — AB
Hgb urine dipstick: NEGATIVE
Ketones, ur: 5 mg/dL — AB
Leukocytes,Ua: NEGATIVE
Nitrite: NEGATIVE
Protein, ur: NEGATIVE mg/dL
Specific Gravity, Urine: 1.026 (ref 1.005–1.030)
pH: 5 (ref 5.0–8.0)

## 2024-01-14 LAB — I-STAT CHEM 8, ED
BUN: 28 mg/dL — ABNORMAL HIGH (ref 8–23)
Calcium, Ion: 1.15 mmol/L (ref 1.15–1.40)
Chloride: 106 mmol/L (ref 98–111)
Creatinine, Ser: 1.6 mg/dL — ABNORMAL HIGH (ref 0.61–1.24)
Glucose, Bld: 122 mg/dL — ABNORMAL HIGH (ref 70–99)
HCT: 39 % (ref 39.0–52.0)
Hemoglobin: 13.3 g/dL (ref 13.0–17.0)
Potassium: 4.1 mmol/L (ref 3.5–5.1)
Sodium: 137 mmol/L (ref 135–145)
TCO2: 18 mmol/L — ABNORMAL LOW (ref 22–32)

## 2024-01-14 LAB — COMPREHENSIVE METABOLIC PANEL WITH GFR
ALT: 20 U/L (ref 0–44)
AST: 51 U/L — ABNORMAL HIGH (ref 15–41)
Albumin: 4.4 g/dL (ref 3.5–5.0)
Alkaline Phosphatase: 47 U/L (ref 38–126)
Anion gap: 13 (ref 5–15)
BUN: 27 mg/dL — ABNORMAL HIGH (ref 8–23)
CO2: 18 mmol/L — ABNORMAL LOW (ref 22–32)
Calcium: 9.1 mg/dL (ref 8.9–10.3)
Chloride: 103 mmol/L (ref 98–111)
Creatinine, Ser: 1.45 mg/dL — ABNORMAL HIGH (ref 0.61–1.24)
GFR, Estimated: 53 mL/min — ABNORMAL LOW
Glucose, Bld: 125 mg/dL — ABNORMAL HIGH (ref 70–99)
Potassium: 4.2 mmol/L (ref 3.5–5.1)
Sodium: 134 mmol/L — ABNORMAL LOW (ref 135–145)
Total Bilirubin: 0.6 mg/dL (ref 0.0–1.2)
Total Protein: 6.9 g/dL (ref 6.5–8.1)

## 2024-01-14 LAB — PROTIME-INR
INR: 1 (ref 0.8–1.2)
Prothrombin Time: 13.8 s (ref 11.4–15.2)

## 2024-01-14 LAB — RESP PANEL BY RT-PCR (RSV, FLU A&B, COVID)  RVPGX2
Influenza A by PCR: POSITIVE — AB
Influenza B by PCR: NEGATIVE
Resp Syncytial Virus by PCR: NEGATIVE
SARS Coronavirus 2 by RT PCR: NEGATIVE

## 2024-01-14 LAB — I-STAT CG4 LACTIC ACID, ED: Lactic Acid, Venous: 0.8 mmol/L (ref 0.5–1.9)

## 2024-01-14 MED ORDER — CARVEDILOL 6.25 MG PO TABS
6.2500 mg | ORAL_TABLET | Freq: Two times a day (BID) | ORAL | Status: DC
  Administered 2024-01-14 – 2024-01-16 (×4): 6.25 mg via ORAL
  Filled 2024-01-14 (×2): qty 2
  Filled 2024-01-14 (×2): qty 1

## 2024-01-14 MED ORDER — ENOXAPARIN SODIUM 40 MG/0.4ML IJ SOSY
40.0000 mg | PREFILLED_SYRINGE | INTRAMUSCULAR | Status: DC
  Administered 2024-01-14 – 2024-01-15 (×2): 40 mg via SUBCUTANEOUS
  Filled 2024-01-14 (×2): qty 0.4

## 2024-01-14 MED ORDER — LACTATED RINGERS IV BOLUS (SEPSIS)
1000.0000 mL | Freq: Once | INTRAVENOUS | Status: AC
  Administered 2024-01-14: 1000 mL via INTRAVENOUS

## 2024-01-14 MED ORDER — ROSUVASTATIN CALCIUM 20 MG PO TABS
40.0000 mg | ORAL_TABLET | Freq: Every day | ORAL | Status: DC
  Administered 2024-01-15 – 2024-01-16 (×2): 40 mg via ORAL
  Filled 2024-01-14 (×2): qty 2

## 2024-01-14 MED ORDER — BICTEGRAVIR-EMTRICITAB-TENOFOV 50-200-25 MG PO TABS
1.0000 | ORAL_TABLET | Freq: Every day | ORAL | Status: DC
  Administered 2024-01-15 – 2024-01-16 (×2): 1 via ORAL
  Filled 2024-01-14 (×3): qty 1

## 2024-01-14 MED ORDER — EMPAGLIFLOZIN 10 MG PO TABS
10.0000 mg | ORAL_TABLET | Freq: Every day | ORAL | Status: DC
  Administered 2024-01-15 – 2024-01-16 (×2): 10 mg via ORAL
  Filled 2024-01-14 (×2): qty 1

## 2024-01-14 MED ORDER — PANTOPRAZOLE SODIUM 40 MG PO TBEC
40.0000 mg | DELAYED_RELEASE_TABLET | Freq: Every day | ORAL | Status: DC
  Administered 2024-01-15 – 2024-01-16 (×2): 40 mg via ORAL
  Filled 2024-01-14 (×2): qty 1

## 2024-01-14 MED ORDER — CLOPIDOGREL BISULFATE 75 MG PO TABS
75.0000 mg | ORAL_TABLET | Freq: Every day | ORAL | Status: DC
  Administered 2024-01-15 – 2024-01-16 (×2): 75 mg via ORAL
  Filled 2024-01-14 (×2): qty 1

## 2024-01-14 MED ORDER — ACETAMINOPHEN 325 MG PO TABS
650.0000 mg | ORAL_TABLET | Freq: Four times a day (QID) | ORAL | Status: DC | PRN
  Administered 2024-01-14 – 2024-01-15 (×4): 650 mg via ORAL
  Filled 2024-01-14 (×4): qty 2

## 2024-01-14 MED ORDER — LACTATED RINGERS IV SOLN: INTRAVENOUS | Status: AC

## 2024-01-14 MED ORDER — LACTATED RINGERS IV BOLUS
500.0000 mL | Freq: Once | INTRAVENOUS | Status: AC
  Administered 2024-01-14: 500 mL via INTRAVENOUS

## 2024-01-14 MED ORDER — LISINOPRIL 20 MG PO TABS
20.0000 mg | ORAL_TABLET | Freq: Every day | ORAL | Status: DC
  Administered 2024-01-15 – 2024-01-16 (×2): 20 mg via ORAL
  Filled 2024-01-14 (×2): qty 1

## 2024-01-14 NOTE — Progress Notes (Signed)
 Elink following for sepsis protocol.

## 2024-01-14 NOTE — ED Triage Notes (Signed)
 C/O back pain, body aches, shob since Sunday. VS was initially BP 86/52 , 87% on room air. Axox4. Crackles in lung sounds.

## 2024-01-14 NOTE — ED Provider Notes (Signed)
 " [facility]    CSN: [id number] Arrival date & time: 01/14/24  0801      History   Chief Complaint Chief Complaint  Patient presents with   Generalized Body Aches   Fever    HPI Jacob Ali is a 63 y.o. male.   Marcey today with complaint of illness that began 4 days ago and has worsened since onset.  He reports fevers, chills, body aches, fatigue and malaise.  He reports that his symptoms have rendered him and unable to get out of bed.  He also reports that he has been  delirious at home stating that he has been disoriented to time and place.  He presents today because he has had increasing aches and pains as well as shortness of breath.  He reports that he is having a difficult time breathing and has shortness of breath at rest and with exertion.  Shortness of breath is rendering him dizzy and in office he reports that he feels like he is going to pass out.  At triage nurse brought me into the room due to oxygen  saturation of 87% and he was placed on 2 L of O2 via nasal cannula and oxygen  increased to 91%.  Patient visibly dyspneic with labored breathing.  He is laying supine on the exam table and endorses dizziness and shortness of breath.  The history is provided by the patient.  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   Peripheral vascular disease    PONV (postoperative nausea and vomiting)    S/P angioplasty with stent 01/30/21 to LAD  01/31/2021   Subdural hematoma 03/23/2020   Subdural hemorrhage  (HCC)    Tubular adenoma of colon 11/03/2017   Colonoscopy Sept 9, 2019    Patient Active Problem List   Diagnosis Date Noted   Trigger finger of left hand 05/15/2023   Cervicalgia 03/21/2023   OSA on CPAP    Snoring 10/15/2022   PAD (peripheral artery disease) 10/15/2022   Overweight 09/12/2022   Needs flu shot 09/12/2022   Type 2 myocardial infarction (HCC) 04/14/2022   Nausea, vomiting, and diarrhea 03/29/2022   Preventative health care 03/01/2022   Black stools 01/08/2022   Chronic neck pain 07/12/2021   Chronic pain of both shoulders 07/12/2021   Sensation of fullness in both ears 04/05/2021   History of cocaine use 03/02/2021   CAD in native artery 01/31/2021   S/P angioplasty with stent 01/30/21 to LAD  01/31/2021   Unstable angina (HCC) 01/30/2021   Chest pain    B12 deficiency 09/13/2020   Family history of malignant neoplasm of digestive organs 08/08/2020   Rectal bleeding 08/08/2020   Body mass index (BMI) 27.0-27.9, adult 04/07/2020   Laceration of scalp without complication 03/23/2020   Tubular adenoma of colon 11/03/2017   Type 2 diabetes mellitus (HCC) 06/18/2016   Hepatitis B immune 03/11/2016   Atherosclerosis with claudication of  extremity 01/29/2016   Arthralgia 10/26/2014   Gastroesophageal reflux disease 06/23/2013   Tobacco use disorder 04/27/2013   Erectile dysfunction 10/27/2012   Insomnia 04/15/2012   Generalized anxiety disorder 02/26/2011    Class: Acute   Suicide attempt (HCC) 02/20/2011   HIV (human immunodeficiency virus infection) (HCC) 11/01/2010   Essential (primary) hypertension 11/01/2010   Hyperlipidemia 11/01/2010    Past Surgical History:  Procedure Laterality Date   CARDIAC CATHETERIZATION     CORONARY STENT INTERVENTION N/A 01/30/2021   Procedure: CORONARY STENT INTERVENTION;  Surgeon: Claudene Victory ORN, MD;  Location: [facility];  Service: Cardiovascular;  Laterality: N/A;   Examination under anesthesia, repair of anal  fissure,  12/04/2000   LEFT HEART CATH AND CORONARY ANGIOGRAPHY N/A 01/30/2021   Procedure: LEFT HEART CATH AND CORONARY ANGIOGRAPHY;  Surgeon: Claudene Victory ORN, MD;  Location: [facility];  Service: Cardiovascular;  Laterality: N/A;   PERIPHERAL VASCULAR CATHETERIZATION Left 02/06/2016   Procedure: Lower Extremity Angiography;  Surgeon: Cordella KANDICE Shawl, MD;  Location: [facility];  Service: Cardiovascular;  Laterality: Left;   PERIPHERAL VASCULAR CATHETERIZATION N/A 02/06/2016   Procedure: Abdominal Aortogram w/Lower Extremity;  Surgeon: Cordella KANDICE Shawl, MD;  Location: [facility];  Service: Cardiovascular;  Laterality: N/A;   PERIPHERAL VASCULAR CATHETERIZATION  02/06/2016   Procedure: Lower Extremity Intervention;  Surgeon: Cordella KANDICE Shawl, MD;  Location: [facility];  Service: Cardiovascular;;       Home Medications    Prior to Admission medications  Medication Sig Start Date End Date Taking? Authorizing Provider  acetaminophen  (TYLENOL ) 500 MG tablet Take 500 mg by mouth every 6 (six) hours as needed.   Yes [provider]  bictegravir-emtricitabine -tenofovir  AF (BIKTARVY ) 50-200-25 MG TABS tablet Take 1 tablet by mouth daily. 07/09/23  Yes Eben Reyes BROCKS, MD  carvedilol  (COREG ) 6.25 MG tablet Take 1 tablet (6.25 mg total) by mouth 2 (two) times daily with a meal. 01/06/24  Yes Elnor Lauraine BRAVO, NP  clopidogrel  (PLAVIX ) 75 MG tablet TAKE 1 TABLET BY MOUTH EVERY DAY 12/26/22  Yes Elnor Lauraine BRAVO, NP  Cyanocobalamin  (VITAMIN B 12 PO) Take 1 tablet by mouth daily at 12 noon.   Yes [provider]  empagliflozin  (JARDIANCE ) 10 MG TABS tablet Take 1 tablet (10 mg total) by mouth daily. 01/06/24  Yes Elnor Lauraine BRAVO, NP  fenofibrate  (TRICOR ) 48 MG tablet Take 1 tablet (48 mg total) by mouth daily. 01/06/24  Yes Elnor Lauraine BRAVO, NP  fluticasone  (FLONASE ) 50 MCG/ACT nasal spray PLACE 1 SPRAY INTO BOTH NOSTRILS 2 (TWO) TIMES DAILY 04/07/22  Yes  Gretta Comer POUR, NP  lisinopril  (ZESTRIL ) 20 MG tablet Take 1 tablet (20 mg total) by mouth daily. 07/31/23  Yes Jeffrie Oneil BROCKS, MD  nitroGLYCERIN  (NITROSTAT ) 0.4 MG SL tablet PLACE 1 TABLET UNDER THE TONGUE EVERY 5 MINUTES FOR 3 DOSES AS NEEDED FOR CHEST PAIN 02/25/22  Yes Marylu Leita SAUNDERS, NP  pantoprazole  (PROTONIX ) 40 MG tablet Take 1 tablet (40 mg total) by mouth daily. 01/06/24  Yes Elnor Lauraine BRAVO, NP  rosuvastatin  (CRESTOR ) 40 MG tablet Take 1 tablet (40 mg total) by mouth daily. 03/31/23  Yes Jeffrie Oneil BROCKS, MD  Blood Glucose Monitoring Suppl (ACCU-CHEK GUIDE ME) w/Device KIT USE TO CHECK BLOOD SUGAR ONCE DAILY 07/01/23   Webb, Padonda B, FNP  fish oil-omega-3 fatty acids 1000 MG capsule Take 2 g by mouth daily.    [provider]  Glucose Blood (BLOOD GLUCOSE TEST STRIPS) STRP 1 each by In Vitro route daily. May substitute to any manufacturer covered by patient's insurance. 05/15/23 05/09/24  Elnor Lauraine BRAVO, NP  methocarbamol  (ROBAXIN ) 500 MG tablet Take 1 tablet (500 mg total) by mouth every 8 (eight) hours as needed for muscle spasms. 05/15/23   Elnor Lauraine BRAVO, NP    Family History Family History  Problem Relation Age of Onset   Hypertension Mother    Asthma Mother    Throat cancer Father    Heart disease Father    Cancer Father        bladder   Cancer Maternal Grandmother        colon    Social History Social History[1]   Allergies   Atorvastatin , Metformin  and related, and Tamiflu  [oseltamivir  phosphate]   Review of Systems Review of Systems  Constitutional:  Positive for activity change, appetite change, chills, fatigue and fever.  HENT:  Positive for congestion.   Respiratory:  Positive for cough, chest tightness and shortness of breath. Negative for stridor.   Cardiovascular:  Negative for chest pain and palpitations.  Gastrointestinal:  Negative for abdominal distention, diarrhea, nausea and vomiting.  Genitourinary:  Negative for decreased urine volume.   Musculoskeletal:  Positive for arthralgias and myalgias.  Neurological:  Positive for dizziness, weakness and light-headedness. Negative for syncope.     Physical Exam Triage Vital Signs ED Triage Vitals  Encounter Vitals Group     BP 01/14/24 0822 (!) 81/56     Girls Systolic BP Percentile --      Girls Diastolic BP Percentile --      Boys Systolic BP Percentile --      Boys Diastolic BP Percentile --      Pulse Rate 01/14/24 0822 94     Resp 01/14/24 0822 (!) 24     Temp 01/14/24 0822 (!) 100.6 F (38.1 C)     Temp Source 01/14/24 0822 Oral     SpO2 01/14/24 0822 (!) 87 %     Weight --      Height --      Head Circumference --      Peak Flow --      Pain Score 01/14/24 0818 10     Pain Loc --      Pain Education --      Exclude from Growth Chart --    No data found.  Updated Vital Signs BP (!) 81/56 (BP Location: Right Arm)   Pulse 94   Temp (!) 100.6 F (38.1 C) (Oral)   Resp (!) 24   SpO2 (!) 87%   Visual Acuity Right Eye Distance:   Left Eye Distance:   Bilateral Distance:    Right Eye Near:   Left Eye Near:    Bilateral Near:     Physical Exam Vitals and nursing note reviewed.  Constitutional:      General: He is not in acute distress.    Appearance: He is ill-appearing. He is not toxic-appearing or diaphoretic.  HENT:     Head: Normocephalic.     Nose: Nose normal.  Eyes:     Conjunctiva/sclera: Conjunctivae normal.  Cardiovascular:     Pulses: Normal pulses.     Heart sounds: Normal heart sounds.  Pulmonary:     Effort: Tachypnea, accessory muscle usage and respiratory distress (Mildly distressed.  Tachypnea present and increased work of breathing.  Patient having difficulty speaking in full sentences due to increased work of  breathing.  No retractions present) present. No prolonged expiration.     Breath sounds: Normal breath sounds. No stridor. No wheezing, rhonchi or rales.  Chest:     Chest wall: No tenderness.  Abdominal:     General:  Abdomen is flat.     Palpations: Abdomen is soft.  Neurological:     Mental Status: He is alert and oriented to person, place, and time.  Psychiatric:        Mood and Affect: Mood normal.        Behavior: Behavior normal.      UC Treatments / Results  Labs (all labs ordered are listed, but only abnormal results are displayed) Labs Reviewed - No data to display  EKG   Radiology No results found.  Procedures Procedures (including critical care time)  Medications Ordered in UC Medications - No data to display  Initial Impression / Assessment and Plan / UC Course  I have reviewed the triage vital signs and the nursing notes.  Pertinent labs & imaging results that were available during my care of the patient were reviewed by me and considered in my medical decision making (see chart for details).     Hypoxia Oxygen  saturation of 87% on room air decreased from patient's baseline of 92.  On assessment patient is tachypneic with increased work of breathing.  Is belly breathing in room and having trouble speaking in full sentences.  Additionally he reports dizziness and  feeling like I am going to pass out.  Oxygen  did come up to 91% on 2 L of oxygen , but due to increased work of breathing and symptomatic hypoxia, EMS called to transport patient to emergency room for further workup and management. Fever  Differentials include flu, COVID, and pneumonia.  However sepsis cannot be ruled out at this time due to hypotension, tachypnea, and decreased oxygen  saturation.  Not able to completely complete superior role as lab work not available at this time, but without blood work patient has 2 points on the superior rule supporting initiating for blood cultures at this time for sepsis work up   Final diagnoses:  None   Discharge Instructions   None    ED Prescriptions   None    PDMP not reviewed this encounter.    [1]  Social History Tobacco Use   Smoking status: Every Day     Current packs/day: 0.50    Average packs/day: 0.5 packs/day for 53.0 years (26.5 ttl pk-yrs)    Types: Cigars, Cigarettes    Start date: 1973   Smokeless tobacco: Never   Tobacco comments:    1/2PPD Mr Steil was wearing a nicotine  patch today and says he plans to quit smoking. Given the phone number to 1-800=quit-now   Vaping Use   Vaping status: Former  Substance Use Topics   Alcohol use: Yes    Alcohol/week: 2.0 standard drinks of alcohol    Types: 2 Standard drinks or equivalent per week    Comment: 1-2 times a week   Drug use: Not Currently    Types: Marijuana    Comment: 2 per month     Leatrice Vernell HERO, NP 01/14/24 0845  "

## 2024-01-14 NOTE — ED Triage Notes (Signed)
 Pt reports cough, body aches, fever, SOB since Sunday. Using OTC meds, took tylenol  at 0730. Reports pain when I cough and my joints aches.  Able to tolerate PO liquids.

## 2024-01-14 NOTE — ED Notes (Signed)
 EMS contacted for transport. Charge nurse Garland made aware

## 2024-01-14 NOTE — ED Notes (Signed)
 CCMD contacted.

## 2024-01-14 NOTE — Discharge Instructions (Signed)
 Sent to [facility] in care of EMS

## 2024-01-14 NOTE — ED Notes (Signed)
 Patient is being discharged from the Urgent Care and sent to the Emergency Department via EMS . Per FABIENE Eland, NP patient is in need of higher level of care due to Hypoxia and hypotension with fever. Patient is aware and verbalizes understanding of plan of care.  Vitals:   01/14/24 0822  BP: (!) 81/56  Pulse: 94  Resp: (!) 24  Temp: (!) 100.6 F (38.1 C)  SpO2: (!) 87%

## 2024-01-14 NOTE — ED Provider Notes (Signed)
 " [place] EMERGENCY DEPARTMENT AT [facility] [place] Provider Note   CSN: [id number] Arrival date & time: 01/14/24  9084     Patient presents with: No chief complaint on file.   Jacob Ali is a 63 y.o. male.   HPI 63 year old male presents today from urgent care with cough, body aches, fever, hypoxia.  He states that Sunday evening he began having sore throat, headache, and chills.  He has had increased cough and dyspnea.  He is a smoker but has not smoked since Sunday when his symptoms started.  He reports taking his antiretrovirals.  He was seen at urgent care where his sats were 87% and he was placed on 2 L of oxygen  increasing his oxygen  saturation is 91%.  He was sent to the ED via [facility].  He reports he may have had some sick contacts along Christmas.  He had a double flu vaccine dose and has had his COVID vaccines.  He is followed by infectious disease and has been taking his medications as prescribed    Prior to Admission medications  Medication Sig Start Date End Date Taking? Authorizing Provider  acetaminophen  (TYLENOL ) 500 MG tablet Take 500 mg by mouth every 6 (six) hours as needed.    [provider]  bictegravir-emtricitabine -tenofovir  AF (BIKTARVY ) 50-200-25 MG TABS tablet Take 1 tablet by mouth daily. 07/09/23   Eben Reyes BROCKS, MD  Blood Glucose Monitoring Suppl (ACCU-CHEK GUIDE ME) w/Device KIT USE TO CHECK BLOOD SUGAR ONCE DAILY 07/01/23   Webb, Padonda B, FNP  carvedilol  (COREG ) 6.25 MG tablet Take 1 tablet (6.25 mg total) by mouth 2 (two) times daily with a meal. 01/06/24   Elnor Lauraine BRAVO, NP  clopidogrel  (PLAVIX ) 75 MG tablet TAKE 1 TABLET BY MOUTH EVERY DAY 12/26/22   Elnor Lauraine BRAVO, NP  Cyanocobalamin  (VITAMIN B 12 PO) Take 1 tablet by mouth daily at 12 noon.    [provider]  empagliflozin  (JARDIANCE ) 10 MG TABS tablet Take 1 tablet (10 mg total) by mouth daily. 01/06/24   Elnor Lauraine BRAVO, NP  fenofibrate  (TRICOR ) 48 MG tablet Take 1  tablet (48 mg total) by mouth daily. 01/06/24   Elnor Lauraine BRAVO, NP  fish oil-omega-3 fatty acids 1000 MG capsule Take 2 g by mouth daily.    [provider]  fluticasone  (FLONASE ) 50 MCG/ACT nasal spray PLACE 1 SPRAY INTO BOTH NOSTRILS 2 (TWO) TIMES DAILY 04/07/22   Gretta Comer POUR, NP  Glucose Blood (BLOOD GLUCOSE TEST STRIPS) STRP 1 each by In Vitro route daily. May substitute to any manufacturer covered by patient's insurance. 05/15/23 05/09/24  Elnor Lauraine BRAVO, NP  lisinopril  (ZESTRIL ) 20 MG tablet Take 1 tablet (20 mg total) by mouth daily. 07/31/23   Jeffrie Oneil BROCKS, MD  methocarbamol  (ROBAXIN ) 500 MG tablet Take 1 tablet (500 mg total) by mouth every 8 (eight) hours as needed for muscle spasms. 05/15/23   Elnor Lauraine BRAVO, NP  nitroGLYCERIN  (NITROSTAT ) 0.4 MG SL tablet PLACE 1 TABLET UNDER THE TONGUE EVERY 5 MINUTES FOR 3 DOSES AS NEEDED FOR CHEST PAIN 02/25/22   Marylu Leita SAUNDERS, NP  pantoprazole  (PROTONIX ) 40 MG tablet Take 1 tablet (40 mg total) by mouth daily. 01/06/24   Elnor Lauraine BRAVO, NP  rosuvastatin  (CRESTOR ) 40 MG tablet Take 1 tablet (40 mg total) by mouth daily. 03/31/23   Jeffrie Oneil BROCKS, MD    Allergies: Atorvastatin , Metformin  and related, and Tamiflu  [oseltamivir  phosphate]    Review of Systems  Updated Vital Signs BP 93/74   Pulse 69   Temp 98.8 F (37.1 C) (Oral)   Resp (!) 22   Ht 1.753 m (5' 9)   Wt 82.6 kg   SpO2 99%   BMI 26.88 kg/m   Physical Exam Vitals reviewed.  Constitutional:      General: He is not in acute distress.    Appearance: Normal appearance. He is ill-appearing.  HENT:     Head: Normocephalic and atraumatic.     Right Ear: External ear normal.     Left Ear: External ear normal.     Nose: Nose normal.     Mouth/Throat:     Pharynx: Oropharynx is clear.  Eyes:     Pupils: Pupils are equal, round, and reactive to light.  Cardiovascular:     Rate and Rhythm: Normal rate and regular rhythm.     Pulses: Normal pulses.     Heart sounds:  Normal heart sounds.  Pulmonary:     Effort: No respiratory distress.     Breath sounds: No stridor. No wheezing or rhonchi.     Comments: Patient is tachypneic with some increased work of breathing Abdominal:     General: There is distension.     Palpations: Abdomen is soft. There is no mass.     Tenderness: There is no abdominal tenderness.  Musculoskeletal:        General: Normal range of motion.     Cervical back: Normal range of motion.  Skin:    General: Skin is warm.     Capillary Refill: Capillary refill takes less than 2 seconds.  Neurological:     General: No focal deficit present.     Mental Status: He is alert.  Psychiatric:        Mood and Affect: Mood normal.     (all labs ordered are listed, but only abnormal results are displayed) Labs Reviewed  CBC WITH DIFFERENTIAL/PLATELET - Abnormal; Notable for the following components:      Result Value   RBC 3.79 (*)    MCV 105.0 (*)    MCH 34.8 (*)    Monocytes Absolute 1.2 (*)    All other components within normal limits  COMPREHENSIVE METABOLIC PANEL WITH GFR - Abnormal; Notable for the following components:   Sodium 134 (*)    CO2 18 (*)    Glucose, Bld 125 (*)    BUN 27 (*)    Creatinine, Ser 1.45 (*)    AST 51 (*)    GFR, Estimated 53 (*)    All other components within normal limits  I-STAT CHEM 8, ED - Abnormal; Notable for the following components:   BUN 28 (*)    Creatinine, Ser 1.60 (*)    Glucose, Bld 122 (*)    TCO2 18 (*)    All other components within normal limits  RESP PANEL BY RT-PCR (RSV, FLU A&B, COVID)  [id number]  CULTURE, BLOOD (ROUTINE X 2)  CULTURE, BLOOD (ROUTINE X 2)  PROTIME-INR  URINALYSIS, W/ REFLEX TO CULTURE (INFECTION SUSPECTED)  CBC  CREATININE, SERUM  I-STAT CG4 LACTIC ACID, ED  I-STAT CG4 LACTIC ACID, ED  I-STAT CG4 LACTIC ACID, ED    EKG: None  Radiology: DG Chest Port 1 View Result Date: 01/14/2024 EXAM: 1 VIEW(S) XRAY OF THE CHEST 01/14/2024 09:39:20 AM  COMPARISON: Bilateral rib series dated 02/25/2022. CLINICAL HISTORY: cough dyspnea FINDINGS: LINES, TUBES AND DEVICES: Multiple overlying monitor wires noted. LUNGS AND PLEURA: No focal  pulmonary opacity. No pleural effusion. No pneumothorax. HEART AND MEDIASTINUM: Aortic atherosclerosis. No acute abnormality of the cardiac and mediastinal silhouettes. BONES AND SOFT TISSUES: No acute osseous abnormality. IMPRESSION: 1. No acute cardiopulmonary process. 2. Aortic atherosclerosis. Electronically signed by: Evalene Coho MD 01/14/2024 10:16 AM EST RP Workstation: [id number]     Procedures   Medications Ordered in the ED  lactated ringers  infusion (0 mLs Intravenous Paused 01/14/24 1120)  enoxaparin  (LOVENOX ) injection 40 mg (has no administration in time range)  lactated ringers  bolus 1,000 mL (0 mLs Intravenous Stopped 01/14/24 1053)  lactated ringers  bolus 500 mL (500 mLs Intravenous New Bag/Given 01/14/24 1118)    Clinical Course as of 01/14/24 1127  Wed Jan 14, 2024  1105 Resp panel by RT-PCR (RSV, Flu A&B, Covid) Anterior Nasal Swab [DR]    Clinical Course User Index [DR] Levander Houston, MD                                 Medical Decision Making Amount and/or Complexity of Data Reviewed Labs: ordered. Decision-making details documented in ED Course. Radiology: ordered.  Risk Prescription drug management.   63 year old male with history of smoking, diabetes, HIV, on right antiretrovirals, presents today with influenza-like illness for several days.  He has experienced significant weakness and dyspnea.  He was seen at urgent care and sats were noted to be 87%.  Nasal cannula was started. Patient presents here for further evaluation.  On my exam patient has some borderline blood pressures with BPs ranging from 90-1 39 systolically.  Initial bolus of 1 L is in and BP is currently 93/74.  Patient reports that he did take his blood pressure medications this morning.  And has had poor  p.o. intake to poor appetite.  He has also had associated diarrhea. Patient evaluated with labs, with normal white blood cell count, complete metabolic panel with creatinine increased to 1.45 from baseline of 1.  Glucose is 125 and CO2 is slightly decreased at 18. Patient was initiated via sepsis protocol.  First lactic acid is normal at 0.8 and second lactic acid is canceled. No broad-spectrum antibiotics were initiated as patient has influenza-like illness and flu is very high in community.  Awaiting respiratory panel results. Chest x-[facility] shows no obvious infiltrates Care discussed with hospitalist who will see for admission     Final diagnoses:  Influenza-like illness  Hypoxia    ED Discharge Orders     None          Levander Houston, MD 01/14/24 1127  "

## 2024-01-14 NOTE — H&P (Addendum)
 " History and Physical    Patient: Jacob Ali FMW:[id number] DOB: [birth year] DOA: 01/14/2024 DOS: the patient was seen and examined on 01/14/2024 PCP: Elnor Lauraine BRAVO, NP  Patient coming from: Home  Chief Complaint: No chief complaint on file.  HPI: Jacob Ali is a 63 y.o. male with medical history significant of HIV on Biktarvy , CAD s/p PCI to LAD January 2023, SDH in 03/2020, HTN, HLD, DM2, GERD, anxiety/depression ,and last admitted to [facility] in 03/2022 with Rotavirus and AKI who p/w AKI iso influenza A.  The patient presented with symptoms that began on Sunday night, initially suspecting the flu. The patient experienced coughing, throat discomfort, and body aches that were described as being all over. A fever was present, although the exact highest temperature was not recalled. The patient initially managed the symptoms at home with over-the-counter medications. Additionally, the patient reported some diarrhea but no vomiting. Over the last couple of days, symptoms fluctuated, with a temporary improvement noted last night. However, the patient experienced a resurgence of fever and muscle weakness, leading to severe pain with movement, prompting them to seek medical attention. The patient's roommate brought them to urgent care, where they were advised to go to the emergency department due to the severity of the illness.  In the ED, pt febrile 100.6, and tachypneic w/o hypoxia (on 4L [place] for unclear reasons). Labs notable for AKI 1.45-1.6. CXR w/o acute process. EDP requested admission for IVF given AKI.   Review of Systems: As mentioned in the history of present illness. All other systems reviewed and are negative. 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   Peripheral vascular disease    PONV (postoperative nausea and vomiting)    S/P angioplasty with stent 01/30/21 to LAD  01/31/2021   Subdural hematoma 03/23/2020   Subdural hemorrhage (HCC)    Tubular adenoma of colon 11/03/2017   Colonoscopy Sept 9, 2019   Past Surgical History:  Procedure Laterality Date   CARDIAC CATHETERIZATION     CORONARY STENT INTERVENTION N/A 01/30/2021   Procedure: CORONARY STENT INTERVENTION;  Surgeon: Claudene Victory ORN, MD;  Location: [facility] INVASIVE CV LAB;  Service: Cardiovascular;  Laterality: N/A;   Examination under anesthesia, repair of anal fissure,  12/04/2000   LEFT HEART CATH AND CORONARY ANGIOGRAPHY N/A 01/30/2021   Procedure: LEFT HEART CATH AND CORONARY ANGIOGRAPHY;  Surgeon: Claudene Victory ORN, MD;  Location: [facility] INVASIVE CV LAB;  Service: Cardiovascular;  Laterality: N/A;   PERIPHERAL VASCULAR CATHETERIZATION Left 02/06/2016   Procedure: Lower Extremity Angiography;  Surgeon: Cordella KANDICE Shawl, MD;  Location: [facility] INVASIVE CV LAB;  Service: Cardiovascular;  Laterality: Left;   PERIPHERAL VASCULAR CATHETERIZATION N/A 02/06/2016   Procedure: Abdominal Aortogram w/Lower Extremity;  Surgeon: Cordella KANDICE Shawl, MD;  Location: [facility] INVASIVE CV LAB;  Service: Cardiovascular;  Laterality: N/A;   PERIPHERAL VASCULAR CATHETERIZATION  02/06/2016   Procedure: Lower Extremity Intervention;  Surgeon: Cordella KANDICE Shawl, MD;  Location: [facility] INVASIVE CV LAB;  Service: Cardiovascular;;   Social History:  reports that he has been smoking cigars and cigarettes.  He started smoking about 53 years ago. He has a 26.5 pack-year smoking history. He has never used smokeless tobacco. He reports current alcohol use of about 2.0 standard drinks of alcohol per week. He reports that he does not currently use drugs after having used the following drugs: Marijuana.  Allergies[1]  Family History  Problem  Relation Age of Onset   Hypertension Mother    Asthma Mother    Throat cancer Father    Heart disease Father    Cancer Father        bladder   Cancer Maternal Grandmother        colon    Prior to Admission medications  Medication Sig Start Date End Date Taking? Authorizing Provider  acetaminophen  (TYLENOL ) 500 MG tablet Take 500 mg by mouth every 6 (six) hours as needed.    [provider]  bictegravir-emtricitabine -tenofovir  AF (BIKTARVY ) 50-200-25 MG TABS tablet Take 1 tablet by mouth daily. 07/09/23   Eben Reyes BROCKS, MD  Blood Glucose Monitoring Suppl (ACCU-CHEK GUIDE ME) w/Device KIT USE TO CHECK BLOOD SUGAR ONCE DAILY 07/01/23   Webb, Padonda B, FNP  carvedilol  (COREG ) 6.25 MG tablet Take 1 tablet (6.25 mg total) by mouth 2 (two) times daily with a meal. 01/06/24   Elnor Lauraine BRAVO, NP  clopidogrel  (PLAVIX ) 75 MG tablet TAKE 1 TABLET BY MOUTH EVERY DAY 12/26/22   Elnor Lauraine BRAVO, NP  Cyanocobalamin  (VITAMIN B 12 PO) Take 1 tablet by mouth daily at 12 noon.    [provider]  empagliflozin  (JARDIANCE ) 10 MG TABS tablet Take 1 tablet (10 mg total) by mouth daily. 01/06/24   Elnor Lauraine BRAVO, NP  fenofibrate  (TRICOR ) 48 MG tablet Take 1 tablet (48 mg total) by mouth daily. 01/06/24   Elnor Lauraine BRAVO, NP  fish oil-omega-3 fatty acids 1000 MG capsule Take 2 g by mouth daily.    [provider]  fluticasone  (FLONASE ) 50 MCG/ACT nasal spray PLACE 1 SPRAY INTO BOTH NOSTRILS 2 (TWO) TIMES DAILY 04/07/22   Gretta Comer POUR, NP  Glucose Blood (BLOOD GLUCOSE TEST STRIPS) STRP 1 each by In Vitro route daily. May substitute to any manufacturer covered by patient's insurance. 05/15/23 05/09/24  Elnor Lauraine BRAVO, NP  lisinopril  (ZESTRIL ) 20 MG tablet Take 1 tablet (20 mg total) by mouth daily. 07/31/23   Jeffrie Oneil BROCKS, MD  methocarbamol  (ROBAXIN ) 500 MG tablet Take 1 tablet (500 mg total) by mouth every 8 (eight) hours as needed for muscle spasms. 05/15/23   Elnor Lauraine BRAVO, NP   nitroGLYCERIN  (NITROSTAT ) 0.4 MG SL tablet PLACE 1 TABLET UNDER THE TONGUE EVERY 5 MINUTES FOR 3 DOSES AS NEEDED FOR CHEST PAIN 02/25/22   Marylu Leita SAUNDERS, NP  pantoprazole  (PROTONIX ) 40 MG tablet Take 1 tablet (40 mg total) by mouth daily. 01/06/24   Elnor Lauraine BRAVO, NP  rosuvastatin  (CRESTOR ) 40 MG tablet Take 1 tablet (40 mg total) by mouth daily. 03/31/23   Jeffrie Oneil BROCKS, MD    Physical Exam: Vitals:   01/14/24 1015 01/14/24 1045 01/14/24 1100 01/14/24 1120  BP: (!) 105/53 139/73 93/74   Pulse: 71 73 69   Resp: 20 (!) 21 (!) 22   Temp:    98.8 F (37.1 C)  TempSrc:    Oral  SpO2: 99% 100% 99%   Weight:      Height:       General: Alert, oriented x3, resting comfortably in no acute distress Respiratory: Lungs clear to auscultation bilaterally with  normal respiratory effort; no w/r/r Cardiovascular: Regular rate and rhythm w/o m/r/g   Data Reviewed:  Lab Results  Component Value Date   WBC 9.6 01/14/2024   HGB 13.3 01/14/2024   HCT 39.0 01/14/2024   MCV 105.0 (H) 01/14/2024   PLT 158 01/14/2024   Lab Results  Component Value Date   GLUCOSE 122 (H) 01/14/2024   CALCIUM  9.1 01/14/2024   NA 137 01/14/2024   K 4.1 01/14/2024   CO2 18 (L) 01/14/2024   CL 106 01/14/2024   BUN 28 (H) 01/14/2024   CREATININE 1.60 (H) 01/14/2024   Lab Results  Component Value Date   ALT 20 01/14/2024   AST 51 (H) 01/14/2024   GGT 44 06/26/2020   ALKPHOS 47 01/14/2024   BILITOT 0.6 01/14/2024   Lab Results  Component Value Date   INR 1.0 01/14/2024   INR 1.20 02/23/2011   INR 1.27 02/23/2011   Radiology: [facility] Chest Port 1 View Result Date: 01/14/2024 EXAM: 1 VIEW(S) XRAY OF THE CHEST 01/14/2024 09:39:20 AM COMPARISON: Bilateral rib series dated 02/25/2022. CLINICAL HISTORY: cough dyspnea FINDINGS: LINES, TUBES AND DEVICES: Multiple overlying monitor wires noted. LUNGS AND PLEURA: No focal pulmonary opacity. No pleural effusion. No pneumothorax. HEART AND MEDIASTINUM: Aortic  atherosclerosis. No acute abnormality of the cardiac and mediastinal silhouettes. BONES AND SOFT TISSUES: No acute osseous abnormality. IMPRESSION: 1. No acute cardiopulmonary process. 2. Aortic atherosclerosis. Electronically signed by: Evalene Coho MD 01/14/2024 10:16 AM EST RP Workstation: [id number]    Assessment and Plan: 4M h/o HIV on Biktarvy , CAD s/p PCI to LAD January 2023, SDH in 03/2020, HTN, HLD, DM2, GERD, anxiety/depression ,and last admitted to [facility] in 03/2022 with Rotavirus and AKI who p/w AKI iso influenza A.  AKI -MIVF: LR at 150cc/h for 24h per EDP -Strict I&Os and daily weights (standing preferred) -F/u BMP daily -Renally dose medications for CrCl -Avoid lovenox , NSAIDs, morphine , Fleet's phosphate enema, regular insulin , contrast; no gadolinium for MRI to avoid nephrogenic systemic fibrosis -Consider renal US  and nephrology consult if worsening AKI  Influenza A -Tamiflu  deferred given allergy -Continue conservative mgt for now -Wean O2 as tolerated  HIV -PTA Biktarvy  1 tablet daily  CAD s/p PCI -PTA Plavix  75mg  daily  HTN -PTA Coreg  and lisinopril   HLD -PTA Crestor    Advance Care Planning:   Code Status: Full Code   Consults: N/A  Family Communication: Sister  Severity of Illness: The appropriate patient status for this patient is INPATIENT. Inpatient status is judged to be reasonable and necessary in order to provide the required intensity of service to ensure the patient's safety. The patient's presenting symptoms, physical exam findings, and initial radiographic and laboratory data in the context of their chronic comorbidities is felt to place them at high risk for further clinical deterioration. Furthermore, it is not anticipated that the patient will be medically stable for discharge from the hospital within 2 midnights of admission.   * I certify that at the point of admission it is my clinical judgment that the patient will require inpatient  hospital care spanning beyond 2 midnights from the point of admission due to high intensity of service, high risk for further deterioration and high frequency of surveillance required.*   ------- I spent 57 minutes reviewing previous notes, at the bedside counseling/discussing the treatment plan, and performing clinical documentation.  Author: Marsha Ada, MD 01/14/2024 12:21 PM  For on call review [email or web address].      [1]  Allergies Allergen Reactions  Atorvastatin  Other (See Comments)    myalgias   Metformin  And Related Other (See Comments)    Acute Kidney injury   Tamiflu  [Oseltamivir  Phosphate] Other (See Comments)    Depressive symptoms   "

## 2024-01-15 DIAGNOSIS — Z955 Presence of coronary angioplasty implant and graft: Secondary | ICD-10-CM | POA: Diagnosis not present

## 2024-01-15 DIAGNOSIS — E785 Hyperlipidemia, unspecified: Secondary | ICD-10-CM | POA: Diagnosis present

## 2024-01-15 DIAGNOSIS — I2510 Atherosclerotic heart disease of native coronary artery without angina pectoris: Secondary | ICD-10-CM | POA: Diagnosis present

## 2024-01-15 DIAGNOSIS — Z7984 Long term (current) use of oral hypoglycemic drugs: Secondary | ICD-10-CM | POA: Diagnosis not present

## 2024-01-15 DIAGNOSIS — J101 Influenza due to other identified influenza virus with other respiratory manifestations: Secondary | ICD-10-CM | POA: Diagnosis present

## 2024-01-15 DIAGNOSIS — E861 Hypovolemia: Secondary | ICD-10-CM

## 2024-01-15 DIAGNOSIS — Z808 Family history of malignant neoplasm of other organs or systems: Secondary | ICD-10-CM | POA: Diagnosis not present

## 2024-01-15 DIAGNOSIS — Z825 Family history of asthma and other chronic lower respiratory diseases: Secondary | ICD-10-CM | POA: Diagnosis not present

## 2024-01-15 DIAGNOSIS — J9601 Acute respiratory failure with hypoxia: Secondary | ICD-10-CM | POA: Diagnosis present

## 2024-01-15 DIAGNOSIS — R0902 Hypoxemia: Secondary | ICD-10-CM | POA: Diagnosis present

## 2024-01-15 DIAGNOSIS — E860 Dehydration: Secondary | ICD-10-CM | POA: Diagnosis present

## 2024-01-15 DIAGNOSIS — N179 Acute kidney failure, unspecified: Secondary | ICD-10-CM | POA: Diagnosis present

## 2024-01-15 DIAGNOSIS — F17290 Nicotine dependence, other tobacco product, uncomplicated: Secondary | ICD-10-CM | POA: Diagnosis present

## 2024-01-15 DIAGNOSIS — E1151 Type 2 diabetes mellitus with diabetic peripheral angiopathy without gangrene: Secondary | ICD-10-CM | POA: Diagnosis present

## 2024-01-15 DIAGNOSIS — Z7902 Long term (current) use of antithrombotics/antiplatelets: Secondary | ICD-10-CM | POA: Diagnosis not present

## 2024-01-15 DIAGNOSIS — K219 Gastro-esophageal reflux disease without esophagitis: Secondary | ICD-10-CM | POA: Diagnosis present

## 2024-01-15 DIAGNOSIS — Z888 Allergy status to other drugs, medicaments and biological substances status: Secondary | ICD-10-CM | POA: Diagnosis not present

## 2024-01-15 DIAGNOSIS — G4733 Obstructive sleep apnea (adult) (pediatric): Secondary | ICD-10-CM | POA: Diagnosis present

## 2024-01-15 DIAGNOSIS — I10 Essential (primary) hypertension: Secondary | ICD-10-CM | POA: Diagnosis present

## 2024-01-15 DIAGNOSIS — Z8249 Family history of ischemic heart disease and other diseases of the circulatory system: Secondary | ICD-10-CM | POA: Diagnosis not present

## 2024-01-15 DIAGNOSIS — Z79899 Other long term (current) drug therapy: Secondary | ICD-10-CM | POA: Diagnosis not present

## 2024-01-15 DIAGNOSIS — Z860101 Personal history of adenomatous and serrated colon polyps: Secondary | ICD-10-CM | POA: Diagnosis not present

## 2024-01-15 DIAGNOSIS — Z1152 Encounter for screening for COVID-19: Secondary | ICD-10-CM | POA: Diagnosis not present

## 2024-01-15 DIAGNOSIS — I959 Hypotension, unspecified: Secondary | ICD-10-CM | POA: Diagnosis present

## 2024-01-15 LAB — CBC
HCT: 32.9 % — ABNORMAL LOW (ref 39.0–52.0)
Hemoglobin: 10.9 g/dL — ABNORMAL LOW (ref 13.0–17.0)
MCH: 34.3 pg — ABNORMAL HIGH (ref 26.0–34.0)
MCHC: 33.1 g/dL (ref 30.0–36.0)
MCV: 103.5 fL — ABNORMAL HIGH (ref 80.0–100.0)
Platelets: 128 K/uL — ABNORMAL LOW (ref 150–400)
RBC: 3.18 MIL/uL — ABNORMAL LOW (ref 4.22–5.81)
RDW: 12.9 % (ref 11.5–15.5)
WBC: 9.6 K/uL (ref 4.0–10.5)
nRBC: 0 % (ref 0.0–0.2)

## 2024-01-15 LAB — BASIC METABOLIC PANEL WITH GFR
Anion gap: 10 (ref 5–15)
BUN: 20 mg/dL (ref 8–23)
CO2: 22 mmol/L (ref 22–32)
Calcium: 8.5 mg/dL — ABNORMAL LOW (ref 8.9–10.3)
Chloride: 104 mmol/L (ref 98–111)
Creatinine, Ser: 0.94 mg/dL (ref 0.61–1.24)
GFR, Estimated: >60 mL/min
Glucose, Bld: 98 mg/dL (ref 70–99)
Potassium: 3.8 mmol/L (ref 3.5–5.1)
Sodium: 135 mmol/L (ref 135–145)

## 2024-01-15 MED ORDER — TRAZODONE HCL 50 MG PO TABS
50.0000 mg | ORAL_TABLET | Freq: Every day | ORAL | Status: DC
  Administered 2024-01-15: 50 mg via ORAL
  Filled 2024-01-15: qty 1

## 2024-01-15 NOTE — Progress Notes (Signed)
" °   01/15/24 1013  TOC ED Mini Assessment  TOC Time spent with patient (minutes): 20  Admission or Readmission Diverted Yes  Means of departure Car  Choice offered to / list presented to  NA    MOON letter signed and given.   Transition of Care [facility]) - Emergency Department Mini Assessment   Patient Details  Name: Jacob Ali MRN: [id number] Date of Birth: 1960-09-22  Transition of [facility]) CM/SW Contact:    Camelia JONETTA Cary, RN Phone Number: 01/15/2024, 10:14 AM   Clinical Narrative:  RNCM spoke to patient regarding d/c plans. Pt. Will return home, friend will transport home. Has C-PAP at home. No needs at this time. Will continue to follow.  ED Mini Assessment:          Means of departure: (P) Car       Patient Contact and Communications        ,              Choice offered to / list presented to : (P) NA  Admission diagnosis:  AKI (acute kidney injury) [N17.9] Patient Active Problem List   Diagnosis Date Noted   AKI (acute kidney injury) 01/14/2024   Trigger finger of left hand 05/15/2023   Cervicalgia 03/21/2023   OSA on CPAP    Snoring 10/15/2022   PAD (peripheral artery disease) 10/15/2022   Overweight 09/12/2022   Needs flu shot 09/12/2022   Type 2 myocardial infarction (HCC) 04/14/2022   Nausea, vomiting, and diarrhea 03/29/2022   Preventative health care 03/01/2022   Black stools 01/08/2022   Chronic neck pain 07/12/2021   Chronic pain of both shoulders 07/12/2021   Sensation of fullness in both ears 04/05/2021   History of cocaine use 03/02/2021   CAD in native artery 01/31/2021   S/P angioplasty with stent 01/30/21 to LAD  01/31/2021   Unstable angina (HCC) 01/30/2021   Chest pain    B12 deficiency 09/13/2020   Family history of malignant neoplasm of digestive organs 08/08/2020   Rectal bleeding 08/08/2020   Body mass index (BMI) 27.0-27.9, adult 04/07/2020   Laceration of scalp without complication 03/23/2020   Tubular adenoma  of colon 11/03/2017   Type 2 diabetes mellitus (HCC) 06/18/2016   Hepatitis B immune 03/11/2016   Atherosclerosis with claudication of extremity 01/29/2016   Arthralgia 10/26/2014   Gastroesophageal reflux disease 06/23/2013   Tobacco use disorder 04/27/2013   Erectile dysfunction 10/27/2012   Insomnia 04/15/2012   Generalized anxiety disorder 02/26/2011    Class: Acute   Suicide attempt (HCC) 02/20/2011   HIV (human immunodeficiency virus infection) (HCC) 11/01/2010   Essential (primary) hypertension 11/01/2010   Hyperlipidemia 11/01/2010   PCP:  Elnor Lauraine BRAVO, NP Pharmacy:   [id number] [address], [place] - [address] AT [facility] [address] [place] [address] Phone: [phone] Fax: [phone]   "

## 2024-01-15 NOTE — Care Management Obs Status (Signed)
 MEDICARE OBSERVATION STATUS NOTIFICATION   Patient Details  Name: Jacob Ali MRN: [id number] Date of Birth: 1960-12-02   Medicare Observation Status Notification Given:  Yes    Camelia JONETTA Cary, RN 01/15/2024, 9:43 AM

## 2024-01-15 NOTE — Progress Notes (Signed)
 Patient states he wears a CPAP at home but wants to wear only the nasal cannula and will ask one of his family members to bring home CPAP in,

## 2024-01-15 NOTE — Progress Notes (Signed)
 " PROGRESS NOTE    Jacob Ali  FMW:[id number] DOB: January 20, 1960 DOA: 01/14/2024 PCP: Elnor Lauraine BRAVO, NP    Brief Narrative:  Patient is a 64 year old male with PMHx of HIV on Biktarvy , CAD s/p PCI to LAD (01/2021), SDH (03/2020), HTN, HLD, T2DM, GERD, anxiety/depression, tobacco abuse who presented to the ED on 01/14/2024 from urgent care after being found hypoxic, hypotensive, and febrile.  Patient was seen in urgent care where he reported 4 days of fevers, chills, body aches, malaise, shortness of breath, dizziness, presyncope, with reported delirium and disorientation.  In urgent care, he was febrile to 100.6 F and found to be dyspneic with increased work of breathing with difficulty speaking in complete sentences, hypoxic to 87% on RA with improvement to 91% on 2 L [place].  BP was 81/56, with patient complaining of dizziness while supine.  On arrival to the ED, SpO2 was 98% on 4 L [place], BP 90/59, RR 24, HR 83.  EKG showed sinus rhythm without acute ST segment changes.  CXR showed no acute cardiopulmonary processes.  Blood cultures were drawn.  CBC was largely unremarkable with no leukocytosis.  CMP showed sodium 134, bicarb 18, BUN 27, creatinine 1.45, AST 51.  4 [place] RVP positive for influenza A. Lactic acid 0.8.  The patient received 1.5 L LR bolus and was started on LR at 150 mL/h.  He was admitted for acute hypoxic respiratory failure in the setting influenza A infection as well as AKI in the setting of hypotension.  He was not started on Tamiflu  due to allergy.  Assessment and Plan:  # Acute hypoxic respite failure - improving - Hypoxic to 87% on RA at urgent care with tachypnea, increased work of breathing. Required 4 L [place] on admission. - This is secondary to influenza A infection - Currently on 2 L [place] with SpO2 93% to 100% - Continue to wean O2 as tolerated  # Influenza A infection - Patient noted sick contacts over Christmas. Experienced viral illnesses symptoms are noted above.  - Has  received flu vaccine this year - Has a documented allergy to Tamiflu , therefore not started - Continue supportive care - Continues to be febrile, noted 104.2 F overnight  # Hypotension - resolved - Presented with hypotension to 81/56 at urgent care, with dizziness and presyncope - This is likely to dehydration secondary to decreased p.o. intake, and some noted diarrhea - Received 1-1/2 L LR in the ED followed by 150 mL/h for 20 hours - BP currently stable  # AKI - resolved - Creatinine elevated to 1.45 on admission then worsened to 1.60 - Improved to 0.94 after IVFs  # HTN - Home lisinopril  and Coreg  resumed  # HIV - Follows with Dr. Eben - Last CD4 count 1,880 (11/2023) - Continue Biktarvy   # CAD s/p PCI - Continue Plavis 75 mg daily and Crestor  40 mg daily  # T2DM - Continue home Jardiance  10 mg daily  # GERD - Continue Protonix  40 mg daily  Scheduled Meds:  bictegravir-emtricitabine -tenofovir  AF  1 tablet Oral Daily   carvedilol   6.25 mg Oral BID WC   clopidogrel   75 mg Oral Daily   empagliflozin   10 mg Oral Daily   enoxaparin  (LOVENOX ) injection  40 mg Subcutaneous Q24H   lisinopril   20 mg Oral Daily   pantoprazole   40 mg Oral Daily   rosuvastatin   40 mg Oral Daily   Continuous Infusions: PRN Meds:.acetaminophen   Current Outpatient Medications  Medication Instructions  acetaminophen  (TYLENOL ) 1,300 mg, Oral, Every 8 hours PRN   barium (VOLUMEN) 0.1 % SUSP 450 mLs,  Once   bictegravir-emtricitabine -tenofovir  AF (BIKTARVY ) 50-200-25 MG TABS tablet 1 tablet, Oral, Daily   Blood Glucose Monitoring Suppl (ACCU-CHEK GUIDE ME) w/Device KIT Daily   carvedilol  (COREG ) 6.25 mg, Oral, 2 times daily with meals   clopidogrel  (PLAVIX ) 75 mg, Oral, Daily   Cyanocobalamin  (VITAMIN B 12 PO) 1 tablet, Oral, Daily   empagliflozin  (JARDIANCE ) 10 mg, Oral, Daily   fenofibrate  (TRICOR ) 48 mg, Oral, Daily   fluticasone  (FLONASE ) 50 MCG/ACT nasal spray 1 spray, Each Nare, 2  times daily   Glucose Blood (BLOOD GLUCOSE TEST STRIPS) STRP 1 each, In Vitro, Daily, May substitute to any manufacturer covered by patient's insurance.   lisinopril  (ZESTRIL ) 20 mg, Oral, Daily   nitroGLYCERIN  (NITROSTAT ) 0.4 MG SL tablet PLACE 1 TABLET UNDER THE TONGUE EVERY 5 MINUTES FOR 3 DOSES AS NEEDED FOR CHEST PAIN   pantoprazole  (PROTONIX ) 40 mg, Oral, Daily   rosuvastatin  (CRESTOR ) 40 mg, Oral, Daily    DVT prophylaxis: enoxaparin  (LOVENOX ) injection 40 mg Start: 01/14/24 2200   Code Status:   Code Status: Full Code  Family Communication: none at bedside  Disposition Plan: Home pending clinical improvement PT -   -   OT -   -    Level of care: Med-Surg  Consultants:  None  Procedures:  None  Antimicrobials: None   Subjective: Patient examined at bedside, reports feeling better now than before, got some of his strength back and improvement in breathing. Still mildly tachypneic.   Objective: Vitals:   01/15/24 0330 01/15/24 0400 01/15/24 0500 01/15/24 0630  BP: (!) 123/52 135/74 134/69 137/72  Pulse: 87 82 86 82  Resp: (!) 26 19 (!) 23 (!) 24  Temp:      TempSrc:      SpO2: 93% 96% 100% 93%  Weight:      Height:        Intake/Output Summary (Last 24 hours) at 01/15/2024 0757 Last data filed at 01/15/2024 0427 Gross per 24 hour  Intake 2537.64 ml  Output 700 ml  Net 1837.64 ml   Filed Weights   01/14/24 0921  Weight: 82.6 kg    Examination:  Gen: NAD, A&Ox3 HEENT: NCAT, EOMI Neck: Supple CV: RRR, no murmurs Resp: mildly tachypneic, CTAB, no w/r/r Abd: Soft, NTND, no guarding Ext: No LE edema Skin: Warm, dry, no rashes/lesions Neuro: No focal deficits, slight generalized weakness Psych: Calm, cooperative, appropriate affect    Data Reviewed: I have personally reviewed following labs and imaging studies  CBC: Recent Labs  Lab 01/14/24 0944 01/14/24 0950 01/15/24 0101  WBC 9.6  --  9.6  NEUTROABS 6.8  --   --   HGB 13.2 13.3 10.9*   HCT 39.8 39.0 32.9*  MCV 105.0*  --  103.5*  PLT 158  --  128*   Basic Metabolic Panel: Recent Labs  Lab 01/14/24 0944 01/14/24 0950 01/15/24 0101  NA 134* 137 135  K 4.2 4.1 3.8  CL 103 106 104  CO2 18*  --  22  GLUCOSE 125* 122* 98  BUN 27* 28* 20  CREATININE 1.45* 1.60* 0.94  CALCIUM  9.1  --  8.5*   GFR: Estimated Creatinine Clearance: 78.3 mL/min (by C-G formula based on SCr of 0.94 mg/dL). Liver Function Tests: Recent Labs  Lab 01/14/24 0944  AST 51*  ALT 20  ALKPHOS 47  BILITOT 0.6  PROT 6.9  ALBUMIN 4.4   No results for input(s): LIPASE, AMYLASE in the last 168 hours. No results for input(s): AMMONIA in the last 168 hours. Coagulation Profile: Recent Labs  Lab 01/14/24 0944  INR 1.0   Cardiac Enzymes: No results for input(s): CKTOTAL, CKMB, CKMBINDEX, TROPONINI in the last 168 hours. BNP (last 3 results) No results for input(s): PROBNP in the last 8760 hours. HbA1C: No results for input(s): HGBA1C in the last 72 hours. CBG: No results for input(s): GLUCAP in the last 168 hours. Lipid Profile: No results for input(s): CHOL, HDL, LDLCALC, TRIG, CHOLHDL, LDLDIRECT in the last 72 hours. Thyroid  Function Tests: No results for input(s): TSH, T4TOTAL, FREET4, T3FREE, THYROIDAB in the last 72 hours. Anemia Panel: No results for input(s): VITAMINB12, FOLATE, FERRITIN, TIBC, IRON, RETICCTPCT in the last 72 hours. Sepsis Labs: Recent Labs  Lab 01/14/24 0950  LATICACIDVEN 0.8    Recent Results (from the past 240 hours)  Resp panel by RT-PCR (RSV, Flu A&B, Covid) Anterior Nasal Swab     Status: Abnormal   Collection Time: 01/14/24  9:44 AM   Specimen: Anterior Nasal Swab  Result Value Ref Range Status   SARS Coronavirus 2 by RT PCR NEGATIVE NEGATIVE Final   Influenza A by PCR POSITIVE (A) NEGATIVE Final   Influenza B by PCR NEGATIVE NEGATIVE Final    Comment: (NOTE) The Xpert Xpress  SARS-CoV-2/FLU/RSV plus assay is intended as an aid in the diagnosis of influenza from Nasopharyngeal swab specimens and should not be used as a sole basis for treatment. Nasal washings and aspirates are unacceptable for Xpert Xpress SARS-CoV-2/FLU/RSV testing.  Fact Sheet for Patients: bloggercourse.com  Fact Sheet for Healthcare Providers: seriousbroker.it  This test is not yet approved or cleared by the [place]  FDA and has been authorized for detection and/or diagnosis of SARS-CoV-2 by FDA under an Emergency Use Authorization (EUA). This EUA will remain in effect (meaning this test can be used) for the duration of the COVID-19 declaration under Section 564(b)(1) of the Act, 21 U.S.C. section 360bbb-3(b)(1), unless the authorization is terminated or revoked.     Resp Syncytial Virus by PCR NEGATIVE NEGATIVE Final    Comment: (NOTE) Fact Sheet for Patients: bloggercourse.com  Fact Sheet for Healthcare Providers: seriousbroker.it  This test is not yet approved or cleared by the [place]  FDA and has been authorized for detection and/or diagnosis of SARS-CoV-2 by FDA under an Emergency Use Authorization (EUA). This EUA will remain in effect (meaning this test can be used) for the duration of the COVID-19 declaration under Section 564(b)(1) of the Act, 21 U.S.C. section 360bbb-3(b)(1), unless the authorization is terminated or revoked.  Performed at [facility], [address], [place], [place] [address]   Blood culture (routine x 2)     Status: None (Preliminary result)   Collection Time: 01/14/24  9:44 AM   Specimen: BLOOD  Result Value Ref Range Status   Specimen Description BLOOD RIGHT ANTECUBITAL  Final   Special Requests   Final    BOTTLES DRAWN AEROBIC AND ANAEROBIC Blood Culture adequate volume   Culture   Final    NO GROWTH < 24 HOURS Performed at  [facility], [address], [place], [place] [address]    Report Status PENDING  Incomplete  Blood culture (routine x 2)     Status: None (Preliminary result)   Collection Time: 01/14/24 10:01 AM   Specimen: BLOOD RIGHT HAND  Result Value Ref Range Status  Specimen Description BLOOD RIGHT HAND  Final   Special Requests   Final    BOTTLES DRAWN AEROBIC AND ANAEROBIC Blood Culture adequate volume   Culture   Final    NO GROWTH < 24 HOURS Performed at [facility], [address], [place], [place] [address]    Report Status PENDING  Incomplete     Radiology Studies: DG Chest Port 1 View Result Date: 01/14/2024 EXAM: 1 VIEW(S) XRAY OF THE CHEST 01/14/2024 09:39:20 AM COMPARISON: Bilateral rib series dated 02/25/2022. CLINICAL HISTORY: cough dyspnea FINDINGS: LINES, TUBES AND DEVICES: Multiple overlying monitor wires noted. LUNGS AND PLEURA: No focal pulmonary opacity. No pleural effusion. No pneumothorax. HEART AND MEDIASTINUM: Aortic atherosclerosis. No acute abnormality of the cardiac and mediastinal silhouettes. BONES AND SOFT TISSUES: No acute osseous abnormality. IMPRESSION: 1. No acute cardiopulmonary process. 2. Aortic atherosclerosis. Electronically signed by: Evalene Coho MD 01/14/2024 10:16 AM EST RP Workstation: [id number]    Scheduled Meds:  bictegravir-emtricitabine -tenofovir  AF  1 tablet Oral Daily   carvedilol   6.25 mg Oral BID WC   clopidogrel   75 mg Oral Daily   empagliflozin   10 mg Oral Daily   enoxaparin  (LOVENOX ) injection  40 mg Subcutaneous Q24H   lisinopril   20 mg Oral Daily   pantoprazole   40 mg Oral Daily   rosuvastatin   40 mg Oral Daily   Continuous Infusions:   Unresulted Labs (From admission, onward)     Start     Ordered   01/21/24 0500  Creatinine, serum  (enoxaparin  (LOVENOX )    CrCl >/= 30 ml/min)  Weekly,   R     Comments: while on enoxaparin  therapy    01/14/24 1126   01/16/24 0500  CBC  Tomorrow morning,   R        01/15/24  2231   01/16/24 0500  Basic metabolic panel with GFR  Tomorrow morning,   R        01/15/24 2231             LOS:  LOS: 0 days   Time Spent: 45 minutes  Anatasia Tino Al-Sultani, MD [facility]  If 7PM-7AM, please contact night-coverage  01/15/2024, 7:57 AM      "

## 2024-01-15 NOTE — Progress Notes (Signed)
 Patient is requesting medication to assist with sleep. LIP informed via secure chat.

## 2024-01-16 LAB — BASIC METABOLIC PANEL WITH GFR
Anion gap: 10 (ref 5–15)
BUN: 17 mg/dL (ref 8–23)
CO2: 23 mmol/L (ref 22–32)
Calcium: 8.5 mg/dL — ABNORMAL LOW (ref 8.9–10.3)
Chloride: 103 mmol/L (ref 98–111)
Creatinine, Ser: 1.04 mg/dL (ref 0.61–1.24)
GFR, Estimated: >60 mL/min
Glucose, Bld: 107 mg/dL — ABNORMAL HIGH (ref 70–99)
Potassium: 3.7 mmol/L (ref 3.5–5.1)
Sodium: 136 mmol/L (ref 135–145)

## 2024-01-16 LAB — CBC
HCT: 35 % — ABNORMAL LOW (ref 39.0–52.0)
Hemoglobin: 11.5 g/dL — ABNORMAL LOW (ref 13.0–17.0)
MCH: 34 pg (ref 26.0–34.0)
MCHC: 32.9 g/dL (ref 30.0–36.0)
MCV: 103.6 fL — ABNORMAL HIGH (ref 80.0–100.0)
Platelets: 135 K/uL — ABNORMAL LOW (ref 150–400)
RBC: 3.38 MIL/uL — ABNORMAL LOW (ref 4.22–5.81)
RDW: 12.7 % (ref 11.5–15.5)
WBC: 10.1 K/uL (ref 4.0–10.5)
nRBC: 0 % (ref 0.0–0.2)

## 2024-01-16 NOTE — Progress Notes (Signed)
 Reviewed AVS, patient expressed understanding of medications, MD follow up reviewed.   Patient states all belongings brought to the hospital at time of admission are accounted for and packed to take home.  This nursing staff transported patient to Discharge lounge to wait for transportation home.

## 2024-01-16 NOTE — TOC Transition Note (Signed)
 Transition of Care [facility]) - Discharge Note   Patient Details  Name: Jacob Ali MRN: [id number] Date of Birth: [birth year]  [facility] CM/SW Contact:  Andrez JULIANNA George, RN Phone Number: 01/16/2024, 11:20 AM   Clinical Narrative:     Pt is discharging home with self care. No needs per IP care management.  Final next level of care: Home/Self Care Barriers to Discharge: No Barriers Identified   Patient Goals and CMS Choice     Choice offered to / list presented to : NA      Discharge Placement                       Discharge Plan and Services Additional resources added to the After Visit Summary for                                       Social Drivers of Health (SDOH) Interventions SDOH Screenings   Food Insecurity: No Food Insecurity (01/14/2024)  Housing: Low Risk (01/14/2024)  Transportation Needs: No Transportation Needs (01/14/2024)  Utilities: Not At Risk (01/14/2024)  Alcohol Screen: Low Risk (03/17/2023)  Depression (PHQ2-9): Low Risk (12/09/2023)  Financial Resource Strain: Medium Risk (03/17/2023)  Physical Activity: Insufficiently Active (03/17/2023)  Social Connections: Socially Isolated (01/14/2024)  Stress: Stress Concern Present (03/17/2023)  Tobacco Use: High Risk (01/14/2024)     Readmission Risk Interventions     No data to display

## 2024-01-18 ENCOUNTER — Other Ambulatory Visit: Payer: Self-pay | Admitting: Nurse Practitioner

## 2024-01-18 ENCOUNTER — Other Ambulatory Visit: Payer: Self-pay | Admitting: Cardiology

## 2024-01-19 ENCOUNTER — Telehealth: Payer: Self-pay | Admitting: *Deleted

## 2024-01-19 LAB — CULTURE, BLOOD (ROUTINE X 2)
Culture: NO GROWTH
Culture: NO GROWTH
Special Requests: ADEQUATE
Special Requests: ADEQUATE

## 2024-01-19 NOTE — Transitions of Care (Post Inpatient/ED Visit) (Signed)
" ° °  01/19/2024  Name: Jacob Ali MRN: [id number] DOB: [birth year]  Today's TOC FU Call Status: Today's TOC FU Call Status:: Unsuccessful Call (1st Attempt) Unsuccessful Call (1st Attempt) Date: 01/19/24  Attempted to reach the patient regarding the most recent Inpatient visit.  Left HIPAA compliant voice message   Follow Up Plan: Additional outreach attempts will be made to reach the patient to complete the Transitions of Care (Post Inpatient/ED visit) call.   Pls call/ message for questions,  Taela Charbonneau Mckinney Eric Morganti, RN, BSN, CCRN Alumnus RN Care Manager  Transitions of Care  [facility] - [facility] [phone]: direct office  "

## 2024-01-20 ENCOUNTER — Telehealth: Payer: Self-pay | Admitting: *Deleted

## 2024-01-20 NOTE — Transitions of Care (Post Inpatient/ED Visit) (Signed)
 "  01/20/2024  Name: Jacob Ali MRN: [id number] DOB: [birth year]  Today's TOC FU Call Status: Today's TOC FU Call Status:: Successful TOC FU Call Completed TOC FU Call Complete Date: 01/20/24  Patient's Name and Date of Birth confirmed. Name, DOB  Transition Care Management Follow-up Telephone Call Date of Discharge: 01/16/24 Discharge Facility: [facility]) Type of Discharge: Inpatient Admission Primary Inpatient Discharge Diagnosis:: Influenza- like illness with hypoxia How have you been since you were released from the hospital?: Same (I am okay I guess but I feel wiped out, and still have this ongoing cough, coughing up green stuff.  Just laying around and not doing much, I have plenty of support at home) Any questions or concerns?: Yes Patient Questions/Concerns:: Ongoing productive cough post-hospital discharge: does not have PCP hospital follow up office visit scheduled- only has routine office visit scheduled and that is not until 01/30/24 Patient Questions/Concerns Addressed: Other: [facility] follow up PCP appointment successfully scheduled by [facility] RN CM in real-time for 01/22/24 with covering PCP provider; enrolled into TOC 30-day program)  Items Reviewed: Did you receive and understand the discharge instructions provided?: Yes (thoroughly reviewed with patient who verbalizes good understanding of same) Medications obtained,verified, and reconciled?: Yes (Medications Reviewed) (Full medication reconciliation/ review completed; no concerns or discrepancies identified; confirmed patient had no newly Rx'd medications post-hospital discharge; self-manages medications and denies questions/ concerns around medications today) Any new allergies since your discharge?: No Dietary orders reviewed?: Yes Type of Diet Ordered:: Regular Do you have support at home?: Yes People in Home [RPT]: friend(s) Name of Support/Comfort Primary Source: Reports independent in self-care activities;  resides with supportive roommate- assists as/ if needed/ indicated; local family memebers also assists as indicated  Medications Reviewed Today: Medications Reviewed Today     Reviewed by Amrita Radu M, RN (Registered Nurse) on 01/20/24 at 1547  Med List Status: <None>   Medication Order Taking? Sig Documenting Provider Last Dose Status Informant  acetaminophen  (TYLENOL ) 650 MG CR tablet [id number] Yes Take 1,300 mg by mouth every 8 (eight) hours as needed for pain. [provider]  Active Self, Pharmacy Records  bictegravir-emtricitabine -tenofovir  AF (BIKTARVY ) 50-200-25 MG TABS tablet [id number] Yes Take 1 tablet by mouth daily. Eben Reyes BROCKS, MD  Active Self, Pharmacy Records  Blood Glucose Monitoring Suppl (ACCU-CHEK GUIDE ME) w/Device PRESSLEY [id number] Yes USE TO CHECK BLOOD SUGAR ONCE DAILY Webb, Padonda B, FNP  Active Self, Pharmacy Records  carvedilol  (COREG ) 6.25 MG tablet [id number] Yes Take 1 tablet (6.25 mg total) by mouth 2 (two) times daily with a meal. Elnor Lauraine BRAVO, NP  Active Self, Pharmacy Records  clopidogrel  (PLAVIX ) 75 MG tablet [id number] Yes TAKE 1 TABLET BY MOUTH EVERY DAY Elnor Lauraine BRAVO, NP  Active   Cyanocobalamin  (VITAMIN B 12 PO) [id number] Yes Take 1 tablet by mouth daily at 12 noon. [provider]  Active Self, Pharmacy Records  empagliflozin  (JARDIANCE ) 10 MG TABS tablet [id number] Yes Take 1 tablet (10 mg total) by mouth daily. Elnor Lauraine BRAVO, NP  Active Self, Pharmacy Records  fenofibrate  (TRICOR ) 48 MG tablet [id number] Yes Take 1 tablet (48 mg total) by mouth daily. Elnor Lauraine BRAVO, NP  Active Self, Pharmacy Records  fluticasone  (FLONASE ) 50 MCG/ACT nasal spray [id number]  PLACE 1 SPRAY INTO BOTH NOSTRILS 2 (TWO) TIMES DAILY  Patient not taking: Reported on 01/20/2024   Gretta Comer POUR, NP  Active Self, Pharmacy Records  Glucose Blood (BLOOD GLUCOSE TEST STRIPS) STRP [id number] Yes 1 each  by In Vitro route daily. May substitute to any manufacturer  covered by patient's insurance. Elnor Lauraine BRAVO, NP  Active Self, Pharmacy Records  lisinopril  (ZESTRIL ) 20 MG tablet [id number] Yes Take 1 tablet (20 mg total) by mouth daily. Jeffrie Oneil BROCKS, MD  Active Self, Pharmacy Records  nitroGLYCERIN  (NITROSTAT ) 0.4 MG SL tablet [id number] Yes PLACE 1 TABLET UNDER THE TONGUE EVERY 5 MINUTES FOR 3 DOSES AS NEEDED FOR CHEST PAIN Marylu Leita SAUNDERS, NP  Active Self, Pharmacy Records  pantoprazole  (PROTONIX ) 40 MG tablet [id number] Yes Take 1 tablet (40 mg total) by mouth daily. Elnor Lauraine BRAVO, NP  Active Self, Pharmacy Records  rosuvastatin  (CRESTOR ) 40 MG tablet [id number] Yes TAKE 1 TABLET BY MOUTH EVERY DAY Jeffrie Oneil BROCKS, MD  Active            Home Care and Equipment/Supplies: Were Home Health Services Ordered?: No Any new equipment or medical supplies ordered?: No  Functional Questionnaire: Do you need assistance with bathing/showering or dressing?: No Do you need assistance with meal preparation?: No Do you need assistance with eating?: No Do you have difficulty maintaining continence: No Do you need assistance with getting out of bed/getting out of a chair/moving?: No Do you have difficulty managing or taking your medications?: No  Follow up appointments reviewed: PCP Follow-up appointment confirmed?: Yes [facility] follow up PCP appointment successfully scheduled by [facility] RN CM real-time for 01/22/24) Date of PCP follow-up appointment?: 01/22/24 Follow-up Provider: PCP- covering provider [facility] Follow-up appointment confirmed?: NA (verified not indicated per hospital discharging provider discharge notes) Do you need transportation to your follow-up appointment?: No Do you understand care options if your condition(s) worsen?: Yes-patient verbalized understanding  SDOH Interventions Today    Flowsheet Row Most Recent Value  SDOH Interventions   Food Insecurity Interventions Intervention Not Indicated  Housing Interventions Intervention  Not Indicated  Transportation Interventions Intervention Not Indicated  [drives self at baseline,  family- friends assist as- if indicated/ needed]  Utilities Interventions Intervention Not Indicated   See TOC assessment tabs/ care plan for additional assessment/ TOC intervention information  I appreciate the opportunity to participate in Steve's care,  Pls call/ message for questions,  Shaheer Bonfield Mckinney Naisha Wisdom, RN, BSN, CCRN Alumnus RN Care Manager  Transitions of Care  [facility]BCI - Population Health  [place] [phone]: direct office  "

## 2024-01-20 NOTE — Patient Instructions (Signed)
 Visit Information  Thank you for taking time to visit with me today. Please don't hesitate to contact me if I can be of assistance to you before our next scheduled telephone appointment.  Our next appointment is by telephone on Tuesday January 27, 2024 at 3:00 pm  Please call the care guide team at [phone] if you need to cancel or reschedule your appointment.   Patient Self Care Activities:  Attend all scheduled provider appointments Call pharmacy for medication refills 3-7 days in advance of running out of medications Call provider office for new concerns or questions  Participate in Transition of Care Program/Attend TOC scheduled calls Take medications as prescribed   Continue pacing activity to avoid episodes of shortness of breath and prevent falls If you believe your condition is getting worse- contact your care providers (doctors) promptly- reaching out to your doctor early when you have concerns can prevent you from having to go to the hospital  Following is a copy of your care plan:   Goals Addressed             This Visit's Progress    VBCI Transitions of Care [facility]) Care Plan       Problems:  Recent hospitalization December 31- January 16, 2024 for influenza-like illness; AKI Independent: drives self; good support at home throughout roommate and local family members; no use of assistive devices; low fall risk (1) unplanned hospital admission x last (6)/ (12) months  Goal:  Over the next 30 days, the patient will not experience hospital readmission  Interventions:  Transitions of Care: week # 1/ day # 1 Durable Medical Equipment (DME) needs assessed with patient/caregiver Doctor Visits  - discussed the importance of doctor visits Communication with PCP re: successful enrollment into TOC 30-day program Arranged PCP follow-up within 7 days (Care Guide Scheduled) Post discharge activity limitations prescribed by provider reviewed Reviewed Signs and symptoms of  infection Hospital follow up PCP appointment successfully scheduled by [facility] RN CM real-time for 01/22/24 Provided my direct contact information should questions/ concerns/ needs arise post-TOC initial call, prior to next TOC 30-day program RN CM telephone visit   Reinforced need for patient to stay hydrated along with signs/ symptoms dehydration and corresponding action plan  Patient Self Care Activities:  Attend all scheduled provider appointments Call pharmacy for medication refills 3-7 days in advance of running out of medications Call provider office for new concerns or questions  Participate in Transition of Care Program/Attend TOC scheduled calls Take medications as prescribed   Continue pacing activity to avoid episodes of shortness of breath and prevent falls If you believe your condition is getting worse- contact your care providers (doctors) promptly- reaching out to your doctor early when you have concerns can prevent you from having to go to the hospital  Plan:  Telephone follow up appointment with care management team member scheduled for:  01/27/24- as scheduled       Care plan and visit instructions communicated with the patient verbally today. Patient agrees to receive a copy in MyChart. Active MyChart status and patient understanding of how to access instructions and care plan via MyChart confirmed with patient.     If you are experiencing a Mental Health or Behavioral Health Crisis or need someone to talk to, please  call the Suicide and Crisis Lifeline: 988 call the [place]  National Suicide Prevention Lifeline: (830)584-3955 or TTY: 403-347-0441 TTY 662-151-5344) to talk to a trained counselor call 1-800-273-TALK (toll free, 24 hour hotline) go to  [facility] [address], [place] [phone]) call the [facility] Line: [phone] call 911   Beatris Blinda Lawrence, RN, BSN, CCRN Alumnus RN Care Manager  Transitions  of Care  VBCI - [facility] [phone]: direct office

## 2024-01-21 ENCOUNTER — Encounter: Payer: Self-pay | Admitting: Cardiovascular Disease

## 2024-01-21 DIAGNOSIS — I959 Hypotension, unspecified: Secondary | ICD-10-CM

## 2024-01-21 DIAGNOSIS — J9601 Acute respiratory failure with hypoxia: Secondary | ICD-10-CM

## 2024-01-21 DIAGNOSIS — R509 Fever, unspecified: Secondary | ICD-10-CM

## 2024-01-21 DIAGNOSIS — J101 Influenza due to other identified influenza virus with other respiratory manifestations: Secondary | ICD-10-CM

## 2024-01-21 NOTE — Discharge Summary (Signed)
 " Physician Discharge Summary   Patient: Jacob Ali MRN: [id number] DOB: 1960-04-18  Admit date:     01/14/2024  Discharge date: 01/16/2024  Discharge Physician: Duffy Al-Sultani   PCP: Elnor Lauraine BRAVO, NP   Recommendations at discharge:   Follow up with PCP within 1-2 weeks of discharge. Repeat CBC and CMP. Repeat CXR Follow up with ID for continued management of HIV  Discharge Diagnoses: Active Problems:   Fever   Influenza A  Principal Problem (Resolved):   AKI (acute kidney injury) Resolved Problems:   Acute hypoxic respiratory failure (HCC)   Hypotension   Hospital Course: Patient is a 64 year old male with PMHx of HIV on Biktarvy , CAD s/p PCI to LAD (01/2021), SDH (03/2020), HTN, HLD, T2DM, GERD, anxiety/depression, tobacco abuse who presented to the ED on 01/14/2024 from urgent care after being found hypoxic, hypotensive, and febrile.  Patient was seen in urgent care where he reported 4 days of fevers, chills, body aches, malaise, shortness of breath, dizziness, presyncope, with reported delirium and disorientation.  In urgent care, he was febrile to 100.6 F and found to be dyspneic with increased work of breathing with difficulty speaking in complete sentences, hypoxic to 87% on RA with improvement to 91% on 2 L [place].  BP was 81/56, with patient complaining of dizziness while supine.  On arrival to the ED, SpO2 was 98% on 4 L [place], BP 90/59, RR 24, HR 83.  EKG showed sinus rhythm without acute ST segment changes.  CXR showed no acute cardiopulmonary processes.  Blood cultures were drawn.  CBC was largely unremarkable with no leukocytosis.  CMP showed sodium 134, bicarb 18, BUN 27, creatinine 1.45, AST 51.  4 Plex RVP positive for influenza A. Lactic acid 0.8.  The patient received 1.5 L LR bolus and was started on LR at 150 mL/h.  He was admitted for acute hypoxic respiratory failure in the setting influenza A infection as well as AKI in the setting of hypotension.  He was not started  on Tamiflu  due to allergy.    # Acute hypoxic respite failure - resolved - Hypoxic to 87% on RA at urgent care with tachypnea, increased work of breathing. Required 4 L [place] on admission. - This was secondary to influenza A infection - Weaned down to RA, maintained appropriate SpO2   # Influenza A infection - Patient noted sick contacts over Christmas. Experienced viral illnesses symptoms as noted above.  - Has received flu vaccine this year - Has a documented allergy to Tamiflu , therefore not started - supportive care   # Hypotension - resolved - Presented with hypotension to 81/56 at urgent care, with dizziness and presyncope - This is likely to dehydration secondary to decreased p.o. intake, and some noted diarrhea - Received 1-1/2 L LR in the ED followed by 150 mL/h for 20 hours - BP currently stable - Orthostatics performed prior to discharge were negative   # AKI - resolved - Creatinine elevated to 1.45 on admission then worsened to 1.60 - Improved to 0.94 after IVFs   # HTN - Home lisinopril  and Coreg  resumed   # HIV - Follows with Dr. Eben - Last CD4 count 1,880 (11/2023) - Continue Biktarvy    # CAD s/p PCI - Continue Plavix  75 mg daily and Crestor  40 mg daily   # T2DM - Continue Jardiance  10 mg daily   # GERD - Continue Protonix  40 mg daily  The patient requested to be discharged. Review of vitals showed  fever overnight, but remained afebrile after that. This was discussed with the patient, but he maintained wanting to be discharge. After orthostatics and ambulatory SpO2 were completed and deemed appropriate, it was felt that he could be discharged given his request with strict return precautions if his fever fails to improve or worsens, or any of his symptoms of hypotension and shortness of breath return. The patient verbalized understanding, was in agreement with the plan, and had all questions answered prior to leaving the hospital.        Consultants:  None Procedures performed: None  Disposition: Home Diet recommendation:   DISCHARGE MEDICATION: Allergies as of 01/16/2024       Reactions   Atorvastatin  Other (See Comments)   myalgias   Metformin  And Related Other (See Comments)   Acute Kidney injury   Tamiflu  [oseltamivir  Phosphate] Other (See Comments)   Depressive symptoms        Medication List     STOP taking these medications    barium 0.1 % Susp Commonly known as: VOLUMEN       TAKE these medications    Accu-Chek Guide Me w/Device Kit USE TO CHECK BLOOD SUGAR ONCE DAILY   acetaminophen  650 MG CR tablet Commonly known as: TYLENOL  Take 1,300 mg by mouth every 8 (eight) hours as needed for pain.   Biktarvy  50-200-25 MG Tabs tablet Generic drug: bictegravir-emtricitabine -tenofovir  AF Take 1 tablet by mouth daily.   BLOOD GLUCOSE TEST STRIPS Strp 1 each by In Vitro route daily. May substitute to any manufacturer covered by patient's insurance.   carvedilol  6.25 MG tablet Commonly known as: COREG  Take 1 tablet (6.25 mg total) by mouth 2 (two) times daily with a meal.   empagliflozin  10 MG Tabs tablet Commonly known as: Jardiance  Take 1 tablet (10 mg total) by mouth daily.   fenofibrate  48 MG tablet Commonly known as: TRICOR  Take 1 tablet (48 mg total) by mouth daily.   fluticasone  50 MCG/ACT nasal spray Commonly known as: FLONASE  PLACE 1 SPRAY INTO BOTH NOSTRILS 2 (TWO) TIMES DAILY   lisinopril  20 MG tablet Commonly known as: ZESTRIL  Take 1 tablet (20 mg total) by mouth daily.   nitroGLYCERIN  0.4 MG SL tablet Commonly known as: NITROSTAT  PLACE 1 TABLET UNDER THE TONGUE EVERY 5 MINUTES FOR 3 DOSES AS NEEDED FOR CHEST PAIN   pantoprazole  40 MG tablet Commonly known as: PROTONIX  Take 1 tablet (40 mg total) by mouth daily.   VITAMIN B 12 PO Take 1 tablet by mouth daily at 12 noon.        Follow-up Information     Elnor Lauraine BRAVO, NP Follow up.   Specialty: Nurse Practitioner Contact  information: [address] [place] [address] [phone]                 Discharge Exam: Fredricka Weights   01/14/24 0921  Weight: 82.6 kg   Blood pressure (!) 141/60, pulse 87, temperature 99 F (37.2 C), temperature source Oral, resp. rate 18, height 5' 9 (1.753 m), weight 82.6 kg, SpO2 95%.    Gen: NAD, A&Ox3 HEENT: NCAT, EOMI Neck: Supple CV: RRR, no murmurs Resp: normal WOB, CTAB, no w/r/r Abd: Soft, NTND, no guarding Ext: No LE edema Skin: Warm, dry, no rashes/lesions Neuro: No focal deficits, slight generalized weakness Psych: Calm, cooperative, appropriate affect  Condition at discharge: good  The results of significant diagnostics from this hospitalization (including imaging, microbiology, ancillary and laboratory) are listed below for reference.  Imaging Studies: DG Chest Port 1 View Result Date: 01/14/2024 EXAM: 1 VIEW(S) XRAY OF THE CHEST 01/14/2024 09:39:20 AM COMPARISON: Bilateral rib series dated 02/25/2022. CLINICAL HISTORY: cough dyspnea FINDINGS: LINES, TUBES AND DEVICES: Multiple overlying monitor wires noted. LUNGS AND PLEURA: No focal pulmonary opacity. No pleural effusion. No pneumothorax. HEART AND MEDIASTINUM: Aortic atherosclerosis. No acute abnormality of the cardiac and mediastinal silhouettes. BONES AND SOFT TISSUES: No acute osseous abnormality. IMPRESSION: 1. No acute cardiopulmonary process. 2. Aortic atherosclerosis. Electronically signed by: Evalene Coho MD 01/14/2024 10:16 AM EST RP Workstation: [id number]    Microbiology: Results for orders placed or performed during the hospital encounter of 01/14/24  Resp panel by RT-PCR (RSV, Flu A&B, Covid) Anterior Nasal Swab     Status: Abnormal   Collection Time: 01/14/24  9:44 AM   Specimen: Anterior Nasal Swab  Result Value Ref Range Status   SARS Coronavirus 2 by RT PCR NEGATIVE NEGATIVE Final   Influenza A by PCR POSITIVE (A) NEGATIVE Final   Influenza B by PCR NEGATIVE  NEGATIVE Final    Comment: (NOTE) The Xpert Xpress SARS-CoV-2/FLU/RSV plus assay is intended as an aid in the diagnosis of influenza from Nasopharyngeal swab specimens and should not be used as a sole basis for treatment. Nasal washings and aspirates are unacceptable for Xpert Xpress SARS-CoV-2/FLU/RSV testing.  Fact Sheet for Patients: bloggercourse.com  Fact Sheet for Healthcare Providers: seriousbroker.it  This test is not yet approved or cleared by the [place]  FDA and has been authorized for detection and/or diagnosis of SARS-CoV-2 by FDA under an Emergency Use Authorization (EUA). This EUA will remain in effect (meaning this test can be used) for the duration of the COVID-19 declaration under Section 564(b)(1) of the Act, 21 U.S.C. section 360bbb-3(b)(1), unless the authorization is terminated or revoked.     Resp Syncytial Virus by PCR NEGATIVE NEGATIVE Final    Comment: (NOTE) Fact Sheet for Patients: bloggercourse.com  Fact Sheet for Healthcare Providers: seriousbroker.it  This test is not yet approved or cleared by the [place]  FDA and has been authorized for detection and/or diagnosis of SARS-CoV-2 by FDA under an Emergency Use Authorization (EUA). This EUA will remain in effect (meaning this test can be used) for the duration of the COVID-19 declaration under Section 564(b)(1) of the Act, 21 U.S.C. section 360bbb-3(b)(1), unless the authorization is terminated or revoked.  Performed at [facility], [address] [place], [place] [address]   Blood culture (routine x 2)     Status: None   Collection Time: 01/14/24  9:44 AM   Specimen: BLOOD  Result Value Ref Range Status   Specimen Description BLOOD RIGHT ANTECUBITAL  Final   Special Requests   Final    BOTTLES DRAWN AEROBIC AND ANAEROBIC Blood Culture adequate volume   Culture   Final    NO  GROWTH 5 DAYS Performed at [facility], [address], [place], [place] [address]    Report Status 01/19/2024 FINAL  Final  Blood culture (routine x 2)     Status: None   Collection Time: 01/14/24 10:01 AM   Specimen: BLOOD RIGHT HAND  Result Value Ref Range Status   Specimen Description BLOOD RIGHT HAND  Final   Special Requests   Final    BOTTLES DRAWN AEROBIC AND ANAEROBIC Blood Culture adequate volume   Culture   Final    NO GROWTH 5 DAYS Performed at [facility], [address], [place], [place] [address]  Report Status 01/19/2024 FINAL  Final    Labs: CBC: Recent Labs  Lab 01/15/24 0101 01/16/24 0206  WBC 9.6 10.1  HGB 10.9* 11.5*  HCT 32.9* 35.0*  MCV 103.5* 103.6*  PLT 128* 135*   Basic Metabolic Panel: Recent Labs  Lab 01/15/24 0101 01/16/24 0206  NA 135 136  K 3.8 3.7  CL 104 103  CO2 22 23  GLUCOSE 98 107*  BUN 20 17  CREATININE 0.94 1.04  CALCIUM  8.5* 8.5*   Liver Function Tests: No results for input(s): AST, ALT, ALKPHOS, BILITOT, PROT, ALBUMIN in the last 168 hours. CBG: No results for input(s): GLUCAP in the last 168 hours.  Discharge time spent: Time Coordinating Discharge: I spent a total of 35 minutes engaged in face-to-face discussion with the patient and/or caregivers regarding the patients care, assessment, plan, and discharge disposition. Over 50% of this time was dedicated to counseling the patient on the risks and benefits of treatment options and the discharge plan, as well as coordinating post-discharge care.   Signed: Manpreet Strey Al-Sultani, MD [facility] 01/21/2024         "

## 2024-01-22 ENCOUNTER — Emergency Department (HOSPITAL_COMMUNITY)

## 2024-01-22 ENCOUNTER — Inpatient Hospital Stay (HOSPITAL_COMMUNITY)
Admission: EM | Admit: 2024-01-22 | Discharge: 2024-01-25 | DRG: 152 | Disposition: A | Discharge status: Home / Self Care | Attending: Student | Admitting: Student

## 2024-01-22 ENCOUNTER — Ambulatory Visit: Admitting: Family Medicine

## 2024-01-22 ENCOUNTER — Encounter: Payer: Self-pay | Admitting: Family Medicine

## 2024-01-22 ENCOUNTER — Observation Stay (HOSPITAL_COMMUNITY)

## 2024-01-22 VITALS — BP 102/56 | HR 93 | Temp 98.3°F | Ht 69.0 in | Wt 171.0 lb

## 2024-01-22 DIAGNOSIS — Z7984 Long term (current) use of oral hypoglycemic drugs: Secondary | ICD-10-CM | POA: Diagnosis not present

## 2024-01-22 DIAGNOSIS — I959 Hypotension, unspecified: Secondary | ICD-10-CM

## 2024-01-22 DIAGNOSIS — E1165 Type 2 diabetes mellitus with hyperglycemia: Secondary | ICD-10-CM

## 2024-01-22 DIAGNOSIS — Z8249 Family history of ischemic heart disease and other diseases of the circulatory system: Secondary | ICD-10-CM

## 2024-01-22 DIAGNOSIS — Z825 Family history of asthma and other chronic lower respiratory diseases: Secondary | ICD-10-CM

## 2024-01-22 DIAGNOSIS — J101 Influenza due to other identified influenza virus with other respiratory manifestations: Secondary | ICD-10-CM | POA: Diagnosis not present

## 2024-01-22 DIAGNOSIS — I2510 Atherosclerotic heart disease of native coronary artery without angina pectoris: Secondary | ICD-10-CM | POA: Diagnosis present

## 2024-01-22 DIAGNOSIS — J9601 Acute respiratory failure with hypoxia: Secondary | ICD-10-CM | POA: Diagnosis present

## 2024-01-22 DIAGNOSIS — J180 Bronchopneumonia, unspecified organism: Secondary | ICD-10-CM | POA: Diagnosis present

## 2024-01-22 DIAGNOSIS — R0602 Shortness of breath: Secondary | ICD-10-CM

## 2024-01-22 DIAGNOSIS — I10 Essential (primary) hypertension: Secondary | ICD-10-CM | POA: Diagnosis present

## 2024-01-22 DIAGNOSIS — F17290 Nicotine dependence, other tobacco product, uncomplicated: Secondary | ICD-10-CM | POA: Diagnosis present

## 2024-01-22 DIAGNOSIS — Z885 Allergy status to narcotic agent status: Secondary | ICD-10-CM

## 2024-01-22 DIAGNOSIS — K219 Gastro-esophageal reflux disease without esophagitis: Secondary | ICD-10-CM | POA: Diagnosis present

## 2024-01-22 DIAGNOSIS — T380X5A Adverse effect of glucocorticoids and synthetic analogues, initial encounter: Secondary | ICD-10-CM | POA: Diagnosis present

## 2024-01-22 DIAGNOSIS — J690 Pneumonitis due to inhalation of food and vomit: Secondary | ICD-10-CM | POA: Diagnosis present

## 2024-01-22 DIAGNOSIS — G4733 Obstructive sleep apnea (adult) (pediatric): Secondary | ICD-10-CM | POA: Diagnosis present

## 2024-01-22 DIAGNOSIS — R0603 Acute respiratory distress: Secondary | ICD-10-CM | POA: Diagnosis not present

## 2024-01-22 DIAGNOSIS — Z888 Allergy status to other drugs, medicaments and biological substances status: Secondary | ICD-10-CM

## 2024-01-22 DIAGNOSIS — R0902 Hypoxemia: Principal | ICD-10-CM

## 2024-01-22 DIAGNOSIS — Z7902 Long term (current) use of antithrombotics/antiplatelets: Secondary | ICD-10-CM

## 2024-01-22 DIAGNOSIS — Z21 Asymptomatic human immunodeficiency virus [HIV] infection status: Secondary | ICD-10-CM | POA: Diagnosis present

## 2024-01-22 DIAGNOSIS — Z79899 Other long term (current) drug therapy: Secondary | ICD-10-CM

## 2024-01-22 DIAGNOSIS — E785 Hyperlipidemia, unspecified: Secondary | ICD-10-CM | POA: Diagnosis present

## 2024-01-22 DIAGNOSIS — Z860101 Personal history of adenomatous and serrated colon polyps: Secondary | ICD-10-CM

## 2024-01-22 DIAGNOSIS — Z955 Presence of coronary angioplasty implant and graft: Secondary | ICD-10-CM

## 2024-01-22 DIAGNOSIS — J441 Chronic obstructive pulmonary disease with (acute) exacerbation: Secondary | ICD-10-CM | POA: Diagnosis present

## 2024-01-22 DIAGNOSIS — J440 Chronic obstructive pulmonary disease with acute lower respiratory infection: Secondary | ICD-10-CM | POA: Diagnosis present

## 2024-01-22 DIAGNOSIS — J439 Emphysema, unspecified: Secondary | ICD-10-CM | POA: Diagnosis present

## 2024-01-22 DIAGNOSIS — J111 Influenza due to unidentified influenza virus with other respiratory manifestations: Principal | ICD-10-CM | POA: Diagnosis present

## 2024-01-22 LAB — COMPREHENSIVE METABOLIC PANEL WITH GFR
ALT: 29 U/L (ref 0–44)
AST: 30 U/L (ref 15–41)
Albumin: 3.8 g/dL (ref 3.5–5.0)
Alkaline Phosphatase: 103 U/L (ref 38–126)
Anion gap: 13 (ref 5–15)
BUN: 20 mg/dL (ref 8–23)
CO2: 20 mmol/L — ABNORMAL LOW (ref 22–32)
Calcium: 9.2 mg/dL (ref 8.9–10.3)
Chloride: 104 mmol/L (ref 98–111)
Creatinine, Ser: 1.05 mg/dL (ref 0.61–1.24)
GFR, Estimated: >60 mL/min
Glucose, Bld: 109 mg/dL — ABNORMAL HIGH (ref 70–99)
Potassium: 4.3 mmol/L (ref 3.5–5.1)
Sodium: 137 mmol/L (ref 135–145)
Total Bilirubin: 0.3 mg/dL (ref 0.0–1.2)
Total Protein: 7.5 g/dL (ref 6.5–8.1)

## 2024-01-22 LAB — CBC WITH DIFFERENTIAL/PLATELET
Abs Immature Granulocytes: 0.1 K/uL — ABNORMAL HIGH (ref 0.00–0.07)
Basophils Absolute: 0.1 K/uL (ref 0.0–0.1)
Basophils Relative: 0 %
Eosinophils Absolute: 0 K/uL (ref 0.0–0.5)
Eosinophils Relative: 0 %
HCT: 36.6 % — ABNORMAL LOW (ref 39.0–52.0)
Hemoglobin: 11.9 g/dL — ABNORMAL LOW (ref 13.0–17.0)
Immature Granulocytes: 1 %
Lymphocytes Relative: 7 %
Lymphs Abs: 1.1 K/uL (ref 0.7–4.0)
MCH: 32.9 pg (ref 26.0–34.0)
MCHC: 32.5 g/dL (ref 30.0–36.0)
MCV: 101.1 fL — ABNORMAL HIGH (ref 80.0–100.0)
Monocytes Absolute: 0.9 K/uL (ref 0.1–1.0)
Monocytes Relative: 5 %
Neutro Abs: 14.9 K/uL — ABNORMAL HIGH (ref 1.7–7.7)
Neutrophils Relative %: 87 %
Platelets: 463 K/uL — ABNORMAL HIGH (ref 150–400)
RBC: 3.62 MIL/uL — ABNORMAL LOW (ref 4.22–5.81)
RDW: 13.2 % (ref 11.5–15.5)
WBC: 17.1 K/uL — ABNORMAL HIGH (ref 4.0–10.5)
nRBC: 0 % (ref 0.0–0.2)

## 2024-01-22 LAB — TROPONIN T, HIGH SENSITIVITY
Troponin T High Sensitivity: <15 ng/L (ref 0–19)
Troponin T High Sensitivity: <15 ng/L (ref 0–19)

## 2024-01-22 LAB — LIPASE, BLOOD: Lipase: 26 U/L (ref 11–51)

## 2024-01-22 LAB — I-STAT CG4 LACTIC ACID, ED: Lactic Acid, Venous: 0.5 mmol/L (ref 0.5–1.9)

## 2024-01-22 LAB — D-DIMER, QUANTITATIVE: D-Dimer, Quant: 0.38 ug{FEU}/mL (ref 0.00–0.50)

## 2024-01-22 LAB — CBG MONITORING, ED: Glucose-Capillary: 219 mg/dL — ABNORMAL HIGH (ref 70–99)

## 2024-01-22 MED ORDER — ACETAMINOPHEN 650 MG RE SUPP: 650.0000 mg | Freq: Four times a day (QID) | RECTAL | Status: DC | PRN

## 2024-01-22 MED ORDER — ALBUTEROL SULFATE (2.5 MG/3ML) 0.083% IN NEBU: 2.5000 mg | INHALATION_SOLUTION | RESPIRATORY_TRACT | Status: DC | PRN

## 2024-01-22 MED ORDER — FENOFIBRATE 54 MG PO TABS
54.0000 mg | ORAL_TABLET | Freq: Every day | ORAL | Status: DC
  Administered 2024-01-23 – 2024-01-25 (×3): 54 mg via ORAL
  Filled 2024-01-22 (×3): qty 1

## 2024-01-22 MED ORDER — INSULIN ASPART 100 UNIT/ML IJ SOLN
0.0000 [IU] | Freq: Three times a day (TID) | INTRAMUSCULAR | Status: DC
  Administered 2024-01-23: 3 [IU] via SUBCUTANEOUS
  Administered 2024-01-24: 5 [IU] via SUBCUTANEOUS
  Administered 2024-01-24 – 2024-01-25 (×3): 2 [IU] via SUBCUTANEOUS
  Filled 2024-01-22 (×2): qty 2
  Filled 2024-01-22: qty 3
  Filled 2024-01-22: qty 1
  Filled 2024-01-22: qty 5
  Filled 2024-01-22: qty 2

## 2024-01-22 MED ORDER — SODIUM CHLORIDE 0.9 % IV SOLN
1.0000 g | INTRAVENOUS | Status: DC
  Administered 2024-01-22 – 2024-01-24 (×3): 1 g via INTRAVENOUS
  Filled 2024-01-22 (×3): qty 10

## 2024-01-22 MED ORDER — AZITHROMYCIN 250 MG PO TABS
500.0000 mg | ORAL_TABLET | Freq: Every day | ORAL | Status: DC
  Administered 2024-01-22 – 2024-01-25 (×4): 500 mg via ORAL
  Filled 2024-01-22 (×4): qty 2

## 2024-01-22 MED ORDER — TRAZODONE HCL 50 MG PO TABS
50.0000 mg | ORAL_TABLET | Freq: Every evening | ORAL | Status: DC | PRN
  Administered 2024-01-22: 50 mg via ORAL
  Filled 2024-01-22: qty 1

## 2024-01-22 MED ORDER — PANTOPRAZOLE SODIUM 40 MG PO TBEC
40.0000 mg | DELAYED_RELEASE_TABLET | Freq: Every day | ORAL | Status: DC
  Administered 2024-01-23 – 2024-01-25 (×3): 40 mg via ORAL
  Filled 2024-01-22 (×3): qty 1

## 2024-01-22 MED ORDER — IPRATROPIUM-ALBUTEROL 0.5-2.5 (3) MG/3ML IN SOLN
3.0000 mL | Freq: Four times a day (QID) | RESPIRATORY_TRACT | Status: DC
  Administered 2024-01-22 – 2024-01-24 (×6): 3 mL via RESPIRATORY_TRACT
  Filled 2024-01-22 (×6): qty 3

## 2024-01-22 MED ORDER — METHYLPREDNISOLONE SODIUM SUCC 40 MG IJ SOLR
40.0000 mg | Freq: Two times a day (BID) | INTRAMUSCULAR | Status: DC
  Administered 2024-01-23: 40 mg via INTRAVENOUS
  Filled 2024-01-22: qty 1

## 2024-01-22 MED ORDER — MAGNESIUM SULFATE 2 GM/50ML IV SOLN
2.0000 g | Freq: Once | INTRAVENOUS | Status: AC
  Administered 2024-01-22: 2 g via INTRAVENOUS
  Filled 2024-01-22: qty 50

## 2024-01-22 MED ORDER — INSULIN ASPART 100 UNIT/ML IJ SOLN
0.0000 [IU] | Freq: Every day | INTRAMUSCULAR | Status: DC
  Administered 2024-01-22 – 2024-01-24 (×2): 2 [IU] via SUBCUTANEOUS
  Filled 2024-01-22 (×2): qty 2

## 2024-01-22 MED ORDER — IPRATROPIUM-ALBUTEROL 0.5-2.5 (3) MG/3ML IN SOLN
3.0000 mL | Freq: Once | RESPIRATORY_TRACT | Status: AC
  Administered 2024-01-22: 3 mL via RESPIRATORY_TRACT
  Filled 2024-01-22: qty 3

## 2024-01-22 MED ORDER — FLUTICASONE PROPIONATE 50 MCG/ACT NA SUSP
1.0000 | Freq: Two times a day (BID) | NASAL | Status: DC
  Administered 2024-01-22 – 2024-01-25 (×6): 1 via NASAL
  Filled 2024-01-22: qty 16

## 2024-01-22 MED ORDER — CARVEDILOL 6.25 MG PO TABS
6.2500 mg | ORAL_TABLET | Freq: Two times a day (BID) | ORAL | Status: DC
  Administered 2024-01-22 – 2024-01-24 (×4): 6.25 mg via ORAL
  Filled 2024-01-22: qty 2
  Filled 2024-01-22 (×2): qty 1
  Filled 2024-01-22: qty 2

## 2024-01-22 MED ORDER — SODIUM CHLORIDE 0.9 % IV SOLN: 500.0000 mg | INTRAVENOUS | Status: DC

## 2024-01-22 MED ORDER — GUAIFENESIN-DM 100-10 MG/5ML PO SYRP
5.0000 mL | ORAL_SOLUTION | ORAL | Status: DC | PRN
  Administered 2024-01-22: 5 mL via ORAL
  Filled 2024-01-22: qty 10
  Filled 2024-01-22: qty 5

## 2024-01-22 MED ORDER — ACETAMINOPHEN 325 MG PO TABS
650.0000 mg | ORAL_TABLET | Freq: Four times a day (QID) | ORAL | Status: DC | PRN
  Administered 2024-01-24: 650 mg via ORAL
  Filled 2024-01-22: qty 2

## 2024-01-22 MED ORDER — SODIUM CHLORIDE 0.9 % IV BOLUS
1000.0000 mL | Freq: Once | INTRAVENOUS | Status: AC
  Administered 2024-01-22: 1000 mL via INTRAVENOUS

## 2024-01-22 MED ORDER — ONDANSETRON HCL 4 MG/2ML IJ SOLN: 4.0000 mg | Freq: Four times a day (QID) | INTRAMUSCULAR | Status: DC | PRN

## 2024-01-22 MED ORDER — BICTEGRAVIR-EMTRICITAB-TENOFOV 50-200-25 MG PO TABS
1.0000 | ORAL_TABLET | Freq: Every day | ORAL | Status: DC
  Administered 2024-01-23 – 2024-01-25 (×3): 1 via ORAL
  Filled 2024-01-22 (×4): qty 1

## 2024-01-22 MED ORDER — IOHEXOL 350 MG/ML SOLN
75.0000 mL | Freq: Once | INTRAVENOUS | Status: AC | PRN
  Administered 2024-01-22: 75 mL via INTRAVENOUS

## 2024-01-22 MED ORDER — CLOPIDOGREL BISULFATE 75 MG PO TABS
75.0000 mg | ORAL_TABLET | Freq: Every day | ORAL | Status: DC
  Administered 2024-01-23 – 2024-01-25 (×3): 75 mg via ORAL
  Filled 2024-01-22 (×3): qty 1

## 2024-01-22 MED ORDER — ROSUVASTATIN CALCIUM 10 MG PO TABS
40.0000 mg | ORAL_TABLET | Freq: Every day | ORAL | Status: DC
  Administered 2024-01-23 – 2024-01-25 (×3): 40 mg via ORAL
  Filled 2024-01-22 (×2): qty 4
  Filled 2024-01-22: qty 2

## 2024-01-22 MED ORDER — EMPAGLIFLOZIN 10 MG PO TABS
10.0000 mg | ORAL_TABLET | Freq: Every day | ORAL | Status: DC
  Administered 2024-01-23 – 2024-01-25 (×3): 10 mg via ORAL
  Filled 2024-01-22 (×3): qty 1

## 2024-01-22 MED ORDER — ENOXAPARIN SODIUM 40 MG/0.4ML IJ SOSY
40.0000 mg | PREFILLED_SYRINGE | INTRAMUSCULAR | Status: DC
  Administered 2024-01-22 – 2024-01-24 (×3): 40 mg via SUBCUTANEOUS
  Filled 2024-01-22 (×3): qty 0.4

## 2024-01-22 MED ORDER — ONDANSETRON HCL 4 MG PO TABS: 4.0000 mg | ORAL_TABLET | Freq: Four times a day (QID) | ORAL | Status: DC | PRN

## 2024-01-22 NOTE — Progress Notes (Signed)
 "  Acute Office Visit  Subjective:     Patient ID: Jacob Ali, male    DOB: April 03, 1960, 64 y.o.   MRN: [id number]  Chief Complaint  Patient presents with   Hospitalization Follow-up    HPI  Discussed the use of AI scribe software for clinical note transcription with the patient, who gave verbal consent to proceed.  [facility] admission for acute resp failure with hypoxia, AKI, influenza A from 01/14/24-01/16/2024 History of Present Illness Jacob Ali Marcey is a 64 year old male with hypertension, HIV, type two diabetes, and GERD who presents with continued shortness of breath and chest pain following hospitalization for flu A.  Respiratory symptoms and chest pain - Persistent shortness of breath, chest pain, and chest tightness since hospitalization for influenza A from January 14, 2024 to January 16, 2024. - Productive cough with discolored sputum. - No improvement in symptoms since hospital discharge. - Received oxygen  therapy during hospitalization; not usually on high oxygen  levels. - Chest x-ray in the emergency room on January 14, 2024 was negative.  Constitutional symptoms - Weakness and fatigue ongoing since hospitalization. - Night sweats present. - No improvement in overall condition since discharge.  Recent hospitalization and laboratory findings - Hospitalized for influenza A from January 14, 2024 to January 16, 2024. - Tamiflu  not administered due to allergy. - Received intravenous fluids and oxygen  therapy during admission. - At discharge, laboratory results included: hemoglobin 11.5, hematocrit 35, platelets 135, creatinine 1.04, calcium  8.5. - Acute kidney injury occurred during hospitalization.  Chronic medical conditions - Hypertension, HIV, type 2 diabetes, and GERD. - Compliant with prescribed medications.     ROS Per HPI      Objective:    BP (!) 102/56 (BP Location: Left Arm, Patient Position: Sitting, Cuff Size: Normal)    Pulse 93   Temp 98.3 F (36.8 C) (Oral)   Ht 5' 9 (1.753 m)   Wt 171 lb (77.6 kg)   SpO2 92% Comment: on 4 LPM O2 via [place]  BMI 25.25 kg/m    Physical Exam Vitals and nursing note reviewed.  Constitutional:      Appearance: He is ill-appearing.  HENT:     Head: Normocephalic and atraumatic.     Right Ear: External ear normal.     Left Ear: External ear normal.     Mouth/Throat:     Mouth: Mucous membranes are moist.  Eyes:     Extraocular Movements: Extraocular movements intact.  Cardiovascular:     Rate and Rhythm: Normal rate and regular rhythm.     Pulses: Normal pulses.     Heart sounds: Normal heart sounds.  Pulmonary:     Effort: Respiratory distress present.     Breath sounds: Wheezing, rhonchi and rales present.  Musculoskeletal:        General: Normal range of motion.     Right lower leg: No edema.     Left lower leg: No edema.  Lymphadenopathy:     Cervical: Cervical adenopathy present.  Skin:    General: Skin is warm and dry.     Coloration: Skin is pale.     Comments: Lips and fingertips mildly cyanotic prior to oxygen  administration  Neurological:     General: No focal deficit present.     Mental Status: He is alert and oriented to person, place, and time.  Psychiatric:        Mood and Affect: Mood normal.  Behavior: Behavior normal.     No results found for any visits on 01/22/24.      Assessment & Plan:   Assessment and Plan Assessment & Plan Influenza A  Likely pneumonia with possible sepsis High concern for decompensation into sepsis due to unstable vitals and low blood pressure. Symptoms include respiratory distress and cyanosis. Recent hospitalization with no improvement. - Encouraged transport to ER for further evaluation and management. - Arranged ambulance EMS transport to [facility] ER.  Acute respiratory distress Oxygen  saturation improved with increased oxygen  flow to 4 lpm via [place], saturations fromm 84-91%. Requires  supplemental oxygen  to maintain adequate oxygenation. - To ER for further eval and mgt  Type 2 diabetes mellitus with hyperglycemia without long term use of insulin , long term use oral hypoglycemic drugs Compliance with medication regimen reported. - blood sugars controlled, even with illness  Symptomatic Hypotension - weakness, dizziness, fatigue - concern for AKI and dehydration - to ER for further eval  Human immunodeficiency virus (HIV) infection Compliance with medication regimen reported. - chronic, stable, continue Biktarvy  - complicates influenza illness       No orders of the defined types were placed in this encounter.    No orders of the defined types were placed in this encounter.   Return for in office after hospital release.  Corean LITTIE Ku, FNP  "

## 2024-01-22 NOTE — ED Notes (Signed)
 Has home cpap at bedside. Pt transitioned from nasal cannula to cpap

## 2024-01-22 NOTE — ED Triage Notes (Addendum)
 Patient BIBA coming from PCP office for increased SOB, recently admitted at [facility] for flu A, DC and no better, 88% RA upon EMS arrival vitals 100/60 HR 85  patient given 5 albuterol  0.5 Atrovent 125 solumedrol en route by EMS.  20g  Left hand. Patient is alert and oriented x 4. Patient 88% on RA placed on 2L [place].

## 2024-01-22 NOTE — H&P (Signed)
 " History and Physical  Jacob Ali FMW:[id number] DOB: 11-08-1960 DOA: 01/22/2024  PCP: Elnor Lauraine BRAVO, NP   Chief Complaint: Shortness of breath  HPI: Jacob Ali is a 65 y.o. male with medical history significant for hypertension, hyperlipidemia, HIV well treated recent hospitalization at [facility] for acute hypoxic respiratory failure in the setting of influenza A infection admitted to the hospital with recurrent acute hypoxic respiratory failure.  Patient states that he was not much improved when he was discharged from [facility] on 1/2, on room air.  Prior to discharge from the hospital, he ambulated on room air without difficulty, and had negative orthostatics.  States that he did not feel completely better when he was discharged home, when he got home he continued to gradually feel worse over the last few days.  He had gradual progression of weakness, dyspnea with exertion, cough not productive of sputum.  Denies any fevers, but was having a lot of night sweats.  He had a follow-up with his PCP today, who sent him to the ER for evaluation due to looking cyanotic, tachypneic and hypoxic.  He was placed on 4 L nasal cannula oxygen  at the clinic and sent to the ER via EMS, who gave him Atrovent, albuterol , Solu-Medrol .  Patient states that after receiving breathing treatments here in the emergency department, he is feeling much better.  Review of Systems: Please see HPI for pertinent positives and negatives. A complete 10 system review of systems are otherwise negative.  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   Peripheral vascular disease    PONV (postoperative nausea and vomiting)     S/P angioplasty with stent 01/30/21 to LAD  01/31/2021   Subdural hematoma 03/23/2020   Subdural hemorrhage (HCC)    Tubular adenoma of colon 11/03/2017   Colonoscopy Sept 9, 2019   Past Surgical History:  Procedure Laterality Date   CARDIAC CATHETERIZATION     CORONARY STENT INTERVENTION N/A 01/30/2021   Procedure: CORONARY STENT INTERVENTION;  Surgeon: Claudene Victory ORN, MD;  Location: [facility] INVASIVE CV LAB;  Service: Cardiovascular;  Laterality: N/A;   Examination under anesthesia, repair of anal fissure,  12/04/2000   LEFT HEART CATH AND CORONARY ANGIOGRAPHY N/A 01/30/2021   Procedure: LEFT HEART CATH AND CORONARY ANGIOGRAPHY;  Surgeon: Claudene Victory ORN, MD;  Location: [facility] INVASIVE CV LAB;  Service: Cardiovascular;  Laterality: N/A;   PERIPHERAL VASCULAR CATHETERIZATION Left 02/06/2016   Procedure: Lower Extremity Angiography;  Surgeon: Cordella KANDICE Shawl, MD;  Location: [facility] INVASIVE CV LAB;  Service: Cardiovascular;  Laterality: Left;   PERIPHERAL VASCULAR CATHETERIZATION N/A 02/06/2016   Procedure: Abdominal Aortogram w/Lower Extremity;  Surgeon: Cordella KANDICE Shawl, MD;  Location: [facility] INVASIVE CV LAB;  Service: Cardiovascular;  Laterality: N/A;   PERIPHERAL VASCULAR CATHETERIZATION  02/06/2016   Procedure: Lower Extremity Intervention;  Surgeon: Cordella KANDICE Shawl, MD;  Location: [facility] INVASIVE CV LAB;  Service: Cardiovascular;;   Social History:  reports that he has been smoking cigars and cigarettes. He started smoking about 53 years ago. He has a 26.5 pack-year smoking history. He  has never used smokeless tobacco. He reports current alcohol use of about 2.0 standard drinks of alcohol per week. He reports that he does not currently use drugs after having used the following drugs: Marijuana.  Allergies  Allergen Reactions   Atorvastatin  Other (See Comments)    myalgias   Metformin  And Related Other (See Comments)    Acute Kidney injury   Tamiflu  [Oseltamivir  Phosphate] Other (See Comments)     Depressive symptoms   Tramadol  Anxiety    Family History  Problem Relation Age of Onset   Hypertension Mother    Asthma Mother    Throat cancer Father    Heart disease Father    Cancer Father        bladder   Cancer Maternal Grandmother        colon     Prior to Admission medications  Medication Sig Start Date End Date Taking? Authorizing Provider  acetaminophen  (TYLENOL ) 650 MG CR tablet Take 1,300 mg by mouth every 8 (eight) hours as needed for pain.    [provider]  bictegravir-emtricitabine -tenofovir  AF (BIKTARVY ) 50-200-25 MG TABS tablet Take 1 tablet by mouth daily. 07/09/23   Eben Reyes BROCKS, MD  Blood Glucose Monitoring Suppl (ACCU-CHEK GUIDE ME) w/Device KIT USE TO CHECK BLOOD SUGAR ONCE DAILY 07/01/23   Webb, Padonda B, FNP  carvedilol  (COREG ) 6.25 MG tablet Take 1 tablet (6.25 mg total) by mouth 2 (two) times daily with a meal. 01/06/24   Elnor Lauraine BRAVO, NP  clopidogrel  (PLAVIX ) 75 MG tablet TAKE 1 TABLET BY MOUTH EVERY DAY 01/20/24   Elnor Lauraine BRAVO, NP  Cyanocobalamin  (VITAMIN B 12 PO) Take 1 tablet by mouth daily at 12 noon.    [provider]  empagliflozin  (JARDIANCE ) 10 MG TABS tablet Take 1 tablet (10 mg total) by mouth daily. 01/06/24   Elnor Lauraine BRAVO, NP  fenofibrate  (TRICOR ) 48 MG tablet Take 1 tablet (48 mg total) by mouth daily. 01/06/24   Elnor Lauraine BRAVO, NP  fluticasone  (FLONASE ) 50 MCG/ACT nasal spray PLACE 1 SPRAY INTO BOTH NOSTRILS 2 (TWO) TIMES DAILY Patient not taking: Reported on 01/20/2024 04/07/22   Gretta Comer POUR, NP  Glucose Blood (BLOOD GLUCOSE TEST STRIPS) STRP 1 each by In Vitro route daily. May substitute to any manufacturer covered by patient's insurance. 05/15/23 05/09/24  Elnor Lauraine BRAVO, NP  lisinopril  (ZESTRIL ) 20 MG tablet Take 1 tablet (20 mg total) by mouth daily. 07/31/23   Jeffrie Oneil BROCKS, MD  nitroGLYCERIN  (NITROSTAT ) 0.4 MG SL tablet PLACE 1 TABLET UNDER THE TONGUE EVERY 5 MINUTES FOR 3 DOSES AS NEEDED FOR CHEST PAIN 02/25/22    Marylu Leita SAUNDERS, NP  pantoprazole  (PROTONIX ) 40 MG tablet Take 1 tablet (40 mg total) by mouth daily. 01/06/24   Elnor Lauraine BRAVO, NP  rosuvastatin  (CRESTOR ) 40 MG tablet TAKE 1 TABLET BY MOUTH EVERY DAY 01/20/24   Jeffrie Oneil BROCKS, MD    Physical Exam: BP 132/67   Pulse 85   Temp 98.1 F (36.7 C) (Oral)   Resp (!) 23   SpO2 92%  General:  Alert, oriented, calm, in no acute distress, currently wearing 4 L nasal cannula oxygen  Eyes: EOMI, clear conjuctivae, white sclerea Neck: supple, no masses, trachea mildline  Cardiovascular: RRR, no murmurs or rubs, no peripheral edema  Respiratory: clear to auscultation bilaterally, no wheezes, no crackles  Abdomen: soft, nontender, nondistended, normal bowel tones heard  Skin: dry, no rashes  Musculoskeletal: no joint effusions, normal range of motion  Psychiatric: appropriate affect, normal speech  Neurologic: extraocular muscles intact, clear speech, moving all extremities with intact sensorium         Labs on Admission:  Basic Metabolic Panel: Recent Labs  Lab 01/16/24 0206 01/22/24 1750  NA 136 137  K 3.7 4.3  CL 103 104  CO2 23 20*  GLUCOSE 107* 109*  BUN 17 20  CREATININE 1.04 1.05  CALCIUM  8.5* 9.2   Liver Function Tests: Recent Labs  Lab 01/22/24 1750  AST 30  ALT 29  ALKPHOS 103  BILITOT 0.3  PROT 7.5  ALBUMIN 3.8   Recent Labs  Lab 01/22/24 1750  LIPASE 26   No results for input(s): AMMONIA in the last 168 hours. CBC: Recent Labs  Lab 01/16/24 0206 01/22/24 1750  WBC 10.1 17.1*  NEUTROABS  --  14.9*  HGB 11.5* 11.9*  HCT 35.0* 36.6*  MCV 103.6* 101.1*  PLT 135* 463*   Cardiac Enzymes: No results for input(s): CKTOTAL, CKMB, CKMBINDEX, TROPONINI in the last 168 hours. BNP (last 3 results) No results for input(s): BNP in the last 8760 hours.  ProBNP (last 3 results) No results for input(s): PROBNP in the last 8760 hours.  CBG: No results for input(s): GLUCAP in the last 168  hours.  Radiological Exams on Admission: DG Chest Portable 1 View Result Date: 01/22/2024 CLINICAL DATA:  Hypoxia, shortness of breath EXAM: PORTABLE CHEST 1 VIEW COMPARISON:  January 14, 2024 FINDINGS: The heart size and mediastinal contours are within normal limits. Both lungs are clear. The visualized skeletal structures are unremarkable. IMPRESSION: No active disease. Electronically Signed   By: Lynwood Landy Raddle M.D.   On: 01/22/2024 17:38   Assessment/Plan Jacob Ali is a 64 y.o. male with medical history significant for hypertension, hyperlipidemia, HIV well treated recent hospitalization at [facility] for acute hypoxic respiratory failure in the setting of influenza A infection admitted to the hospital with recurrent acute hypoxic respiratory failure.   Acute hypoxic respiratory failure-he does have a history of tobacco abuse and emphysema, he was discharged from [facility] 1/2 in stable condition on room air.  He has had continued and progressive cough, wheezing, shortness of breath with exertion.  No obvious infectious symptoms, though he was having some night sweats, and has developed a leukocytosis. -Observation admission -Supplemental oxygen  -Check stat CT chest PE protocol, to rule out PE or consolidation -Scheduled DuoNebs, as needed albuterol  nebulizer -Incentive spirometer, flutter valve -Guaifenesin  for cough  HIV-well-controlled -Continue Biktarvy   Hypertension-continue home Coreg , will consider resuming home lisinopril  if blood pressure remains elevated.  Antihypertensives were held at [facility] due to hypotension.  Hyperlipidemia-continue home statin, fibrate  Type 2 diabetes-carb modified diet, continue Jardiance   CAD-status post PCI -Home Plavix , Crestor   GERD-Protonix  daily  DVT prophylaxis: Lovenox      Code Status: Full Code  Consults called: None  Admission status: Observation  Time spent: 49 minutes  Ellis Koffler CHRISTELLA Gail MD [facility] Pager [phone]  If  7PM-7AM, please contact night-coverage [email or web address] Password [facility]  01/22/2024, 6:42 PM   "

## 2024-01-22 NOTE — ED Provider Notes (Signed)
 "  EMERGENCY DEPARTMENT AT [facility] Provider Note   CSN: [id number] Arrival date & time: 01/22/24  1603     Patient presents with: Shortness of Breath   Jacob Ali is a 64 y.o. male.   The history is provided by the patient and medical records. No language interpreter was used.  Shortness of Breath Severity:  Severe Onset quality:  Gradual Duration:  1 week Timing:  Constant Progression:  Worsening Chronicity:  New Context: URI   Relieved by:  Nothing Worsened by:  Coughing Ineffective treatments:  Diuretics Associated symptoms: cough, fever, sputum production and wheezing   Associated symptoms: no abdominal pain, no chest pain, no headaches, no rash and no vomiting   Risk factors: no hx of PE/DVT        Prior to Admission medications  Medication Sig Start Date End Date Taking? Authorizing Provider  acetaminophen  (TYLENOL ) 650 MG CR tablet Take 1,300 mg by mouth every 8 (eight) hours as needed for pain.    [provider]  bictegravir-emtricitabine -tenofovir  AF (BIKTARVY ) 50-200-25 MG TABS tablet Take 1 tablet by mouth daily. 07/09/23   Eben Reyes BROCKS, MD  Blood Glucose Monitoring Suppl (ACCU-CHEK GUIDE ME) w/Device KIT USE TO CHECK BLOOD SUGAR ONCE DAILY 07/01/23   Webb, Padonda B, FNP  carvedilol  (COREG ) 6.25 MG tablet Take 1 tablet (6.25 mg total) by mouth 2 (two) times daily with a meal. 01/06/24   Elnor Lauraine BRAVO, NP  clopidogrel  (PLAVIX ) 75 MG tablet TAKE 1 TABLET BY MOUTH EVERY DAY 01/20/24   Elnor Lauraine BRAVO, NP  Cyanocobalamin  (VITAMIN B 12 PO) Take 1 tablet by mouth daily at 12 noon.    [provider]  empagliflozin  (JARDIANCE ) 10 MG TABS tablet Take 1 tablet (10 mg total) by mouth daily. 01/06/24   Elnor Lauraine BRAVO, NP  fenofibrate  (TRICOR ) 48 MG tablet Take 1 tablet (48 mg total) by mouth daily. 01/06/24   Gray, Sarah E, NP  fluticasone  (FLONASE ) 50 MCG/ACT nasal spray PLACE 1 SPRAY INTO BOTH NOSTRILS 2 (TWO) TIMES  DAILY Patient not taking: Reported on 01/20/2024 04/07/22   Gretta Comer POUR, NP  Glucose Blood (BLOOD GLUCOSE TEST STRIPS) STRP 1 each by In Vitro route daily. May substitute to any manufacturer covered by patient's insurance. 05/15/23 05/09/24  Elnor Lauraine BRAVO, NP  lisinopril  (ZESTRIL ) 20 MG tablet Take 1 tablet (20 mg total) by mouth daily. 07/31/23   Jeffrie Oneil BROCKS, MD  nitroGLYCERIN  (NITROSTAT ) 0.4 MG SL tablet PLACE 1 TABLET UNDER THE TONGUE EVERY 5 MINUTES FOR 3 DOSES AS NEEDED FOR CHEST PAIN 02/25/22   Marylu Leita SAUNDERS, NP  pantoprazole  (PROTONIX ) 40 MG tablet Take 1 tablet (40 mg total) by mouth daily. 01/06/24   Elnor Lauraine BRAVO, NP  rosuvastatin  (CRESTOR ) 40 MG tablet TAKE 1 TABLET BY MOUTH EVERY DAY 01/20/24   Jeffrie Oneil BROCKS, MD    Allergies: Atorvastatin , Metformin  and related, Tamiflu  [oseltamivir  phosphate], and Tramadol     Review of Systems  Constitutional:  Positive for chills, fatigue and fever.  HENT:  Positive for congestion.   Respiratory:  Positive for cough, sputum production, chest tightness, shortness of breath and wheezing. Negative for stridor.   Cardiovascular:  Negative for chest pain, palpitations and leg swelling.  Gastrointestinal:  Positive for diarrhea and nausea. Negative for abdominal pain, constipation and vomiting.  Genitourinary:  Negative for flank pain and frequency.  Musculoskeletal:  Negative for back pain.  Skin:  Negative for rash and wound.  Neurological:  Negative for weakness, light-headedness, numbness and headaches.  Psychiatric/Behavioral:  Negative for agitation and confusion.   All other systems reviewed and are negative.   Updated Vital Signs BP (!) 131/106 (BP Location: Right Arm)   Pulse 86   Temp 98.1 F (36.7 C) (Oral)   Resp 18   SpO2 93%   Physical Exam Vitals and nursing note reviewed.  Constitutional:      General: He is not in acute distress.    Appearance: He is well-developed. He is ill-appearing. He is not toxic-appearing or  diaphoretic.  HENT:     Head: Normocephalic and atraumatic.  Eyes:     Extraocular Movements: Extraocular movements intact.     Conjunctiva/sclera: Conjunctivae normal.     Pupils: Pupils are equal, round, and reactive to light.  Cardiovascular:     Rate and Rhythm: Normal rate and regular rhythm.     Heart sounds: No murmur heard. Pulmonary:     Effort: Pulmonary effort is normal. Tachypnea present. No respiratory distress.     Breath sounds: Wheezing and rhonchi present. No rales.  Chest:     Chest wall: No tenderness.  Abdominal:     Palpations: Abdomen is soft.     Tenderness: There is no abdominal tenderness.  Musculoskeletal:        General: No swelling.     Cervical back: Neck supple.     Right lower leg: No edema.     Left lower leg: No edema.  Skin:    General: Skin is warm and dry.     Capillary Refill: Capillary refill takes less than 2 seconds.     Findings: No erythema.  Neurological:     Mental Status: He is alert.  Psychiatric:        Mood and Affect: Mood normal.     (all labs ordered are listed, but only abnormal results are displayed) Labs Reviewed  CBC WITH DIFFERENTIAL/PLATELET - Abnormal; Notable for the following components:      Result Value   WBC 17.1 (*)    RBC 3.62 (*)    Hemoglobin 11.9 (*)    HCT 36.6 (*)    MCV 101.1 (*)    Platelets 463 (*)    Neutro Abs 14.9 (*)    Abs Immature Granulocytes 0.10 (*)    All other components within normal limits  COMPREHENSIVE METABOLIC PANEL WITH GFR - Abnormal; Notable for the following components:   CO2 20 (*)    Glucose, Bld 109 (*)    All other components within normal limits  LIPASE, BLOOD  I-STAT CG4 LACTIC ACID, ED  TROPONIN T, HIGH SENSITIVITY    EKG: EKG Interpretation Date/Time:  Thursday January 22 2024 17:13:53 EST Ventricular Rate:  84 PR Interval:  147 QRS Duration:  105 QT Interval:  370 QTC Calculation: 438 R Axis:   45  Text Interpretation: Sinus rhythm when compared to  prior, more artifact No STEMI Confirmed by Ginger Barefoot ([id number]) on 01/22/2024 5:22:02 PM  Radiology: DG Chest Portable 1 View Result Date: 01/22/2024 CLINICAL DATA:  Hypoxia, shortness of breath EXAM: PORTABLE CHEST 1 VIEW COMPARISON:  January 14, 2024 FINDINGS: The heart size and mediastinal contours are within normal limits. Both lungs are clear. The visualized skeletal structures are unremarkable. IMPRESSION: No active disease. Electronically Signed   By: Lynwood Landy Raddle M.D.   On: 01/22/2024 17:38     Procedures   Medications Ordered in the ED  magnesium  sulfate IVPB 2  g 50 mL (2 g Intravenous New Bag/Given 01/22/24 1741)  sodium chloride  0.9 % bolus 1,000 mL (1,000 mLs Intravenous New Bag/Given 01/22/24 1741)  ipratropium-albuterol  (DUONEB) 0.5-2.5 (3) MG/3ML nebulizer solution 3 mL (3 mLs Nebulization Given 01/22/24 1736)                                    Medical Decision Making Amount and/or Complexity of Data Reviewed Labs: ordered. Radiology: ordered.  Risk Prescription drug management.    Jacob Ali is a 64 y.o. male with a past medical history significant for hypertension, diabetes, HIV, previous subdural hemorrhage, CAD status post PCI, and recent admission to the hospital for influenza who presents with hypoxia and worsening shortness of breath.  According to patient, for the last week has been having worsening shortness of breath despite being discharged from the hospital 6 days ago for further treatment of the flu.  He says that he has had worsening coughing and wheezing and shortness of breath.  He is reporting some nausea but is not describing vomiting.  He reports diarrhea at times.  He feels tired and fatigued and oxygen  saturations were in the 80s today.  Patient received Solu-Medrol  and DuoNeb with EMS and is now on 3 L to keep his oxygen  saturations in the low 90s.  He was sent from PCP office where he was found to be hypoxic on his follow-up visit.  On exam, lungs  are very coarse and have wheezing still despite initial interventions.  Will order another breathing treatment, give magnesium , and give him some fluids as his mouth is very dry.  Chest was nontender.  Abdomen nontender.  No focal neurologic deficits.  He is on the 3 L now to keep his oxygen  saturations in we will get chest x-ray to look for pneumonia and get other screening labs.  As he was just positive for flu will not retest him at this time.  Due to his new hypoxic respiratory failure, will call for admission after workup is completed.     6:24 PM X-ray returned and does not show pneumonia but he does have a large leukocytosis.  Suspect persistent symptoms of influenza causing his breathing difficulty.  Due to the new hypoxia and new oxygen  requirement will call for readmission.  6:39 PM Spoke to the admitting team who requested a D-dimer to help rule out a thromboembolic etiology of his hypoxia as well.  They will follow-up on this result but I ordered it for them.     Final diagnoses:  Hypoxia  SOB (shortness of breath)  Influenza     Clinical Impression: 1. Hypoxia   2. SOB (shortness of breath)   3. Influenza     Disposition: Admit  This note was prepared with assistance of Dragon voice recognition software. Occasional wrong-word or sound-a-like substitutions may have occurred due to the inherent limitations of voice recognition software.     Nickia Boesen, Lonni PARAS, MD 01/22/24 1840  "

## 2024-01-22 NOTE — Patient Instructions (Signed)
Go directly to the ER for further evaluation and treatment ° °

## 2024-01-23 ENCOUNTER — Other Ambulatory Visit: Payer: Self-pay

## 2024-01-23 ENCOUNTER — Encounter (HOSPITAL_COMMUNITY): Payer: Self-pay | Admitting: Internal Medicine

## 2024-01-23 DIAGNOSIS — J111 Influenza due to unidentified influenza virus with other respiratory manifestations: Secondary | ICD-10-CM | POA: Diagnosis present

## 2024-01-23 DIAGNOSIS — E1165 Type 2 diabetes mellitus with hyperglycemia: Secondary | ICD-10-CM | POA: Diagnosis present

## 2024-01-23 DIAGNOSIS — J9601 Acute respiratory failure with hypoxia: Secondary | ICD-10-CM | POA: Diagnosis present

## 2024-01-23 DIAGNOSIS — Z825 Family history of asthma and other chronic lower respiratory diseases: Secondary | ICD-10-CM | POA: Diagnosis not present

## 2024-01-23 DIAGNOSIS — J180 Bronchopneumonia, unspecified organism: Secondary | ICD-10-CM | POA: Diagnosis present

## 2024-01-23 DIAGNOSIS — J440 Chronic obstructive pulmonary disease with acute lower respiratory infection: Secondary | ICD-10-CM | POA: Diagnosis present

## 2024-01-23 DIAGNOSIS — Z21 Asymptomatic human immunodeficiency virus [HIV] infection status: Secondary | ICD-10-CM | POA: Diagnosis present

## 2024-01-23 DIAGNOSIS — J439 Emphysema, unspecified: Secondary | ICD-10-CM | POA: Diagnosis present

## 2024-01-23 DIAGNOSIS — Z8249 Family history of ischemic heart disease and other diseases of the circulatory system: Secondary | ICD-10-CM | POA: Diagnosis not present

## 2024-01-23 DIAGNOSIS — T380X5A Adverse effect of glucocorticoids and synthetic analogues, initial encounter: Secondary | ICD-10-CM | POA: Diagnosis present

## 2024-01-23 DIAGNOSIS — K219 Gastro-esophageal reflux disease without esophagitis: Secondary | ICD-10-CM | POA: Diagnosis present

## 2024-01-23 DIAGNOSIS — I10 Essential (primary) hypertension: Secondary | ICD-10-CM | POA: Diagnosis present

## 2024-01-23 DIAGNOSIS — I959 Hypotension, unspecified: Secondary | ICD-10-CM | POA: Diagnosis present

## 2024-01-23 DIAGNOSIS — Z79899 Other long term (current) drug therapy: Secondary | ICD-10-CM | POA: Diagnosis not present

## 2024-01-23 DIAGNOSIS — G4733 Obstructive sleep apnea (adult) (pediatric): Secondary | ICD-10-CM | POA: Diagnosis present

## 2024-01-23 DIAGNOSIS — E785 Hyperlipidemia, unspecified: Secondary | ICD-10-CM | POA: Diagnosis present

## 2024-01-23 DIAGNOSIS — Z860101 Personal history of adenomatous and serrated colon polyps: Secondary | ICD-10-CM | POA: Diagnosis not present

## 2024-01-23 DIAGNOSIS — F17290 Nicotine dependence, other tobacco product, uncomplicated: Secondary | ICD-10-CM | POA: Diagnosis present

## 2024-01-23 DIAGNOSIS — Z955 Presence of coronary angioplasty implant and graft: Secondary | ICD-10-CM | POA: Diagnosis not present

## 2024-01-23 DIAGNOSIS — J690 Pneumonitis due to inhalation of food and vomit: Secondary | ICD-10-CM | POA: Diagnosis present

## 2024-01-23 DIAGNOSIS — I2510 Atherosclerotic heart disease of native coronary artery without angina pectoris: Secondary | ICD-10-CM | POA: Diagnosis present

## 2024-01-23 DIAGNOSIS — J441 Chronic obstructive pulmonary disease with (acute) exacerbation: Secondary | ICD-10-CM | POA: Diagnosis present

## 2024-01-23 DIAGNOSIS — Z7902 Long term (current) use of antithrombotics/antiplatelets: Secondary | ICD-10-CM | POA: Diagnosis not present

## 2024-01-23 DIAGNOSIS — Z7984 Long term (current) use of oral hypoglycemic drugs: Secondary | ICD-10-CM | POA: Diagnosis not present

## 2024-01-23 DIAGNOSIS — R0602 Shortness of breath: Secondary | ICD-10-CM | POA: Diagnosis present

## 2024-01-23 LAB — CBC
HCT: 34.8 % — ABNORMAL LOW (ref 39.0–52.0)
Hemoglobin: 11.4 g/dL — ABNORMAL LOW (ref 13.0–17.0)
MCH: 33.1 pg (ref 26.0–34.0)
MCHC: 32.8 g/dL (ref 30.0–36.0)
MCV: 101.2 fL — ABNORMAL HIGH (ref 80.0–100.0)
Platelets: 435 K/uL — ABNORMAL HIGH (ref 150–400)
RBC: 3.44 MIL/uL — ABNORMAL LOW (ref 4.22–5.81)
RDW: 13.2 % (ref 11.5–15.5)
WBC: 13.7 K/uL — ABNORMAL HIGH (ref 4.0–10.5)
nRBC: 0 % (ref 0.0–0.2)

## 2024-01-23 LAB — GLUCOSE, CAPILLARY
Glucose-Capillary: 163 mg/dL — ABNORMAL HIGH (ref 70–99)
Glucose-Capillary: 182 mg/dL — ABNORMAL HIGH (ref 70–99)

## 2024-01-23 LAB — BASIC METABOLIC PANEL WITH GFR
Anion gap: 11 (ref 5–15)
BUN: 20 mg/dL (ref 8–23)
CO2: 19 mmol/L — ABNORMAL LOW (ref 22–32)
Calcium: 8.6 mg/dL — ABNORMAL LOW (ref 8.9–10.3)
Chloride: 105 mmol/L (ref 98–111)
Creatinine, Ser: 0.96 mg/dL (ref 0.61–1.24)
GFR, Estimated: >60 mL/min
Glucose, Bld: 205 mg/dL — ABNORMAL HIGH (ref 70–99)
Potassium: 4.3 mmol/L (ref 3.5–5.1)
Sodium: 134 mmol/L — ABNORMAL LOW (ref 135–145)

## 2024-01-23 LAB — CBG MONITORING, ED
Glucose-Capillary: 175 mg/dL — ABNORMAL HIGH (ref 70–99)
Glucose-Capillary: 169 mg/dL — ABNORMAL HIGH (ref 70–99)

## 2024-01-23 LAB — HEMOGLOBIN A1C
Hgb A1c MFr Bld: 4.5 % — ABNORMAL LOW (ref 4.8–5.6)
Mean Plasma Glucose: 82.45 mg/dL

## 2024-01-23 MED ORDER — PREDNISONE 20 MG PO TABS
40.0000 mg | ORAL_TABLET | Freq: Every day | ORAL | Status: DC
  Administered 2024-01-24 – 2024-01-25 (×2): 40 mg via ORAL
  Filled 2024-01-23 (×2): qty 2

## 2024-01-23 MED ORDER — BUDESONIDE 0.25 MG/2ML IN SUSP
0.2500 mg | Freq: Two times a day (BID) | RESPIRATORY_TRACT | Status: DC
  Administered 2024-01-23 – 2024-01-24 (×2): 0.25 mg via RESPIRATORY_TRACT
  Filled 2024-01-23 (×2): qty 2

## 2024-01-23 MED ORDER — HYDRALAZINE HCL 20 MG/ML IJ SOLN
5.0000 mg | Freq: Four times a day (QID) | INTRAMUSCULAR | Status: DC | PRN
  Administered 2024-01-23: 5 mg via INTRAVENOUS
  Filled 2024-01-23: qty 1

## 2024-01-23 MED ORDER — ENSURE PLUS HIGH PROTEIN PO LIQD
237.0000 mL | Freq: Two times a day (BID) | ORAL | Status: DC
  Administered 2024-01-24 – 2024-01-25 (×3): 237 mL via ORAL

## 2024-01-23 MED ORDER — LISINOPRIL 20 MG PO TABS
20.0000 mg | ORAL_TABLET | Freq: Every day | ORAL | Status: DC
  Administered 2024-01-23 – 2024-01-25 (×3): 20 mg via ORAL
  Filled 2024-01-23 (×2): qty 1

## 2024-01-23 NOTE — Progress Notes (Signed)
 " PROGRESS NOTE    Jacob Ali  FMW:[id number] DOB: December 18, 1960 DOA: 01/22/2024 PCP: Elnor Lauraine BRAVO, NP  Subjective: Patient reports feeling better since getting some nebulizers and steroids in the ED.  Does not use any oxygen  at home  Hospital Course: 64 year old male with PMH of HTN, HLD, HIV (well-controlled on Biktarvy ), recent hospitalization at [facility] for hypoxic respiratory failure due to influenza A, and now returns with ongoing shortness of breath.  Further developed weakness, dyspnea on exertion, had follow-up with his PCP and was found to be hypoxic there, placed on 4 L nasal cannula and transferred to the ED.  He received nebulizers and Solu-Medrol  after which he reported feeling better.  CTA chest was negative for PE, but noted to have likely bronchopneumonia or aspiration for which he was started on antibiotics.   Assessment and Plan:  Acute hypoxic respiratory failure - has known emphysema, this is likely an exacerbation most likely caused by bronchopneumonia.  He did recently have influenza A - on 3L O2, wean as able - continue ceftriaxone  and azithromycin  - continue DuoNebs, added Pulmicort  - continue incentive spirometry - on prednisone  for 5 days   HIV - on Biktarvy   HTN - on coreg  and lisinopril  was also resumed - for his COPD/emphysema, Coreg  may not be an ideal choice at as it is a nonselective beta-blocker, can discuss with PCP/cardiologist about possibly switching over to metoprolol   HLD - on statin and fenofibrate   DM2 - on Jardiance , carb modified diet  CAD - on Plavix  and statin  GERD - on Protonix      DVT prophylaxis: enoxaparin  (LOVENOX ) injection 40 mg Start: 01/22/24 2200    Code Status: Full Code Disposition Plan: TBD Reason for continuing need for hospitalization: New O2  Objective: Vitals:   01/23/24 0908 01/23/24 1111 01/23/24 1120 01/23/24 1240  BP: (!) 173/79   (!) 164/102  Pulse: 74 80 82 82  Resp: 16   18  Temp: (!) 97.4 F (36.3 C)    97.6 F (36.4 C)  TempSrc: Oral   Oral  SpO2: 96% 90% 93% 92%    Intake/Output Summary (Last 24 hours) at 01/23/2024 1457 Last data filed at 01/23/2024 0302 Gross per 24 hour  Intake --  Output 1000 ml  Net -1000 ml   There were no vitals filed for this visit.  Examination:  Physical Exam Vitals and nursing note reviewed.  Constitutional:      General: He is not in acute distress. Cardiovascular:     Rate and Rhythm: Normal rate.  Pulmonary:     Effort: No respiratory distress.     Breath sounds: No wheezing.  Abdominal:     General: There is no distension.     Tenderness: There is no abdominal tenderness.  Musculoskeletal:     Right lower leg: No edema.     Left lower leg: No edema.     Data Reviewed: I have personally reviewed following labs and imaging studies  CBC: Recent Labs  Lab 01/22/24 1750 01/23/24 0301  WBC 17.1* 13.7*  NEUTROABS 14.9*  --   HGB 11.9* 11.4*  HCT 36.6* 34.8*  MCV 101.1* 101.2*  PLT 463* 435*   Basic Metabolic Panel: Recent Labs  Lab 01/22/24 1750 01/23/24 0301  NA 137 134*  K 4.3 4.3  CL 104 105  CO2 20* 19*  GLUCOSE 109* 205*  BUN 20 20  CREATININE 1.05 0.96  CALCIUM  9.2 8.6*   GFR: Estimated Creatinine Clearance: 76.7 mL/min (  by C-G formula based on SCr of 0.96 mg/dL). Liver Function Tests: Recent Labs  Lab 01/22/24 1750  AST 30  ALT 29  ALKPHOS 103  BILITOT 0.3  PROT 7.5  ALBUMIN 3.8   Recent Labs  Lab 01/22/24 1750  LIPASE 26   No results for input(s): AMMONIA in the last 168 hours. Coagulation Profile: No results for input(s): INR, PROTIME in the last 168 hours. Cardiac Enzymes: No results for input(s): CKTOTAL, CKMB, CKMBINDEX, TROPONINI in the last 168 hours. ProBNP, BNP (last 5 results) No results for input(s): PROBNP, BNP in the last 8760 hours. HbA1C: Recent Labs    01/22/24 2013  HGBA1C 4.5*   CBG: Recent Labs  Lab 01/22/24 2134 01/23/24 0901 01/23/24 1236  GLUCAP  219* 175* 169*   Lipid Profile: No results for input(s): CHOL, HDL, LDLCALC, TRIG, CHOLHDL, LDLDIRECT in the last 72 hours. Thyroid  Function Tests: No results for input(s): TSH, T4TOTAL, FREET4, T3FREE, THYROIDAB in the last 72 hours. Anemia Panel: No results for input(s): VITAMINB12, FOLATE, FERRITIN, TIBC, IRON, RETICCTPCT in the last 72 hours. Sepsis Labs: Recent Labs  Lab 01/22/24 1740  LATICACIDVEN 0.5    Recent Results (from the past 240 hours)  Resp panel by RT-PCR (RSV, Flu A&B, Covid) Anterior Nasal Swab     Status: Abnormal   Collection Time: 01/14/24  9:44 AM   Specimen: Anterior Nasal Swab  Result Value Ref Range Status   SARS Coronavirus 2 by RT PCR NEGATIVE NEGATIVE Final   Influenza A by PCR POSITIVE (A) NEGATIVE Final   Influenza B by PCR NEGATIVE NEGATIVE Final    Comment: (NOTE) The Xpert Xpress SARS-CoV-2/FLU/RSV plus assay is intended as an aid in the diagnosis of influenza from Nasopharyngeal swab specimens and should not be used as a sole basis for treatment. Nasal washings and aspirates are unacceptable for Xpert Xpress SARS-CoV-2/FLU/RSV testing.  Fact Sheet for Patients: bloggercourse.com  Fact Sheet for Healthcare Providers: seriousbroker.it  This test is not yet approved or cleared by the [place]  FDA and has been authorized for detection and/or diagnosis of SARS-CoV-2 by FDA under an Emergency Use Authorization (EUA). This EUA will remain in effect (meaning this test can be used) for the duration of the COVID-19 declaration under Section 564(b)(1) of the Act, 21 U.S.C. section 360bbb-3(b)(1), unless the authorization is terminated or revoked.     Resp Syncytial Virus by PCR NEGATIVE NEGATIVE Final    Comment: (NOTE) Fact Sheet for Patients: bloggercourse.com  Fact Sheet for Healthcare  Providers: seriousbroker.it  This test is not yet approved or cleared by the [place]  FDA and has been authorized for detection and/or diagnosis of SARS-CoV-2 by FDA under an Emergency Use Authorization (EUA). This EUA will remain in effect (meaning this test can be used) for the duration of the COVID-19 declaration under Section 564(b)(1) of the Act, 21 U.S.C. section 360bbb-3(b)(1), unless the authorization is terminated or revoked.  Performed at [facility], [address] [place], [place] [address]   Blood culture (routine x 2)     Status: None   Collection Time: 01/14/24  9:44 AM   Specimen: BLOOD  Result Value Ref Range Status   Specimen Description BLOOD RIGHT ANTECUBITAL  Final   Special Requests   Final    BOTTLES DRAWN AEROBIC AND ANAEROBIC Blood Culture adequate volume   Culture   Final    NO GROWTH 5 DAYS Performed at [facility], [address], [place], [place] [address]  Report Status 01/19/2024 FINAL  Final  Blood culture (routine x 2)     Status: None   Collection Time: 01/14/24 10:01 AM   Specimen: BLOOD RIGHT HAND  Result Value Ref Range Status   Specimen Description BLOOD RIGHT HAND  Final   Special Requests   Final    BOTTLES DRAWN AEROBIC AND ANAEROBIC Blood Culture adequate volume   Culture   Final    NO GROWTH 5 DAYS Performed at [facility], [address] [place], [place] [address]    Report Status 01/19/2024 FINAL  Final     Radiology Studies: CT Angio Chest Pulmonary Embolism (PE) W or WO Contrast Result Date: 01/22/2024 EXAM: CTA of the Chest with contrast for PE 01/22/2024 08:41:01 PM TECHNIQUE: CTA of the chest was performed after the administration of intravenous contrast. Multiplanar reformatted images are provided for review. MIP images are provided for review. Automated exposure control, iterative reconstruction, and/or weight based adjustment of the mA/kV was utilized to reduce the  radiation dose to as low as reasonably achievable. COMPARISON: Comparison with chest radiograph 01/22/2024 and CT chest 03/10/2023. CLINICAL HISTORY: Pulmonary embolism (PE) suspected, high prob. Shortness of breath and decreased oxygen  saturation. FINDINGS: PULMONARY ARTERIES: Pulmonary arteries are adequately opacified for evaluation. No pulmonary embolism. Main pulmonary artery is normal in caliber. MEDIASTINUM: The heart demonstrates normal size, no pericardial effusions, and calcification of the coronary arteries. Calcification of the aorta is present, with no dissection. The esophagus is decompressed. The thyroid  gland is unremarkable. LYMPH NODES: Scattered mediastinal lymph nodes, largest subcarinal nodes measuring 12 mm diameter. Similar appearance to prior study. Likely reactive. LUNGS AND PLEURA: Motion artifact limits evaluation of the lungs. Nodular peribronchial thickening is present, which is noted to be a good thing because it is not as bad as it is in the lung. Inflation is suggested throughout both lungs, greater on the right. This is likely bronchopneumonia or aspiration. No pleural effusion or pneumothorax. UPPER ABDOMEN: Cholelithiasis with contracted gallbladder. No bile duct dilatation. SOFT TISSUES AND BONES: No acute bone or soft tissue abnormality. IMPRESSION: 1. No pulmonary embolism. 2. Nodular peribronchial thickening and inflammation throughout both lungs, greater on the right, likely representing bronchopneumonia or aspiration. Evaluation limited by motion artifact. 3. Scattered mediastinal lymph nodes, largest subcarinal nodes measuring 12 mm diameter, likely reactive and similar appearance to prior study. Electronically signed by: Elsie Gravely MD 01/22/2024 08:46 PM EST RP Workstation: [id number]   DG Chest Portable 1 View Result Date: 01/22/2024 CLINICAL DATA:  Hypoxia, shortness of breath EXAM: PORTABLE CHEST 1 VIEW COMPARISON:  January 14, 2024 FINDINGS: The heart size and  mediastinal contours are within normal limits. Both lungs are clear. The visualized skeletal structures are unremarkable. IMPRESSION: No active disease. Electronically Signed   By: Lynwood Landy Raddle M.D.   On: 01/22/2024 17:38    Scheduled Meds:  azithromycin   500 mg Oral Daily   bictegravir-emtricitabine -tenofovir  AF  1 tablet Oral Daily   carvedilol   6.25 mg Oral BID   clopidogrel   75 mg Oral Daily   empagliflozin   10 mg Oral Daily   enoxaparin  (LOVENOX ) injection  40 mg Subcutaneous Q24H   fenofibrate   54 mg Oral Daily   fluticasone   1 spray Each Nare BID   insulin  aspart  0-15 Units Subcutaneous TID WC   insulin  aspart  0-5 Units Subcutaneous QHS   ipratropium-albuterol   3 mL Nebulization QID   lisinopril   20 mg Oral Daily   pantoprazole   40 mg  Oral Daily   [START ON 01/24/2024] predniSONE   40 mg Oral Q breakfast   rosuvastatin   40 mg Oral Daily   Continuous Infusions:  cefTRIAXone  (ROCEPHIN )  IV Stopped (01/22/24 2236)     LOS: 0 days   Time spent: 40 minutes  Casimer Dare, MD  [facility]  01/23/2024, 2:57 PM   "

## 2024-01-23 NOTE — ED Notes (Signed)
 Declined Biktarvy  medication. Says he only takes it once a day in the morning. Took his morning dose already and ordered as 10am administrations.

## 2024-01-23 NOTE — Progress Notes (Signed)
" °   01/23/24 2227  BiPAP/CPAP/SIPAP  Reason BIPAP/CPAP not in use Other(comment) (patient has home resmed machine and will put it on when he is ready to use it.)    "

## 2024-01-23 NOTE — ED Notes (Signed)
 Pt oxygen  noted to drop to 89% while on room air. Pt placed back on [place] at 2L.

## 2024-01-24 DIAGNOSIS — J441 Chronic obstructive pulmonary disease with (acute) exacerbation: Secondary | ICD-10-CM

## 2024-01-24 DIAGNOSIS — I10 Essential (primary) hypertension: Secondary | ICD-10-CM | POA: Diagnosis not present

## 2024-01-24 DIAGNOSIS — J180 Bronchopneumonia, unspecified organism: Secondary | ICD-10-CM

## 2024-01-24 DIAGNOSIS — J9601 Acute respiratory failure with hypoxia: Secondary | ICD-10-CM | POA: Diagnosis not present

## 2024-01-24 LAB — GLUCOSE, CAPILLARY
Glucose-Capillary: 128 mg/dL — ABNORMAL HIGH (ref 70–99)
Glucose-Capillary: 147 mg/dL — ABNORMAL HIGH (ref 70–99)
Glucose-Capillary: 212 mg/dL — ABNORMAL HIGH (ref 70–99)
Glucose-Capillary: 226 mg/dL — ABNORMAL HIGH (ref 70–99)

## 2024-01-24 MED ORDER — METOPROLOL SUCCINATE ER 50 MG PO TB24
50.0000 mg | ORAL_TABLET | Freq: Every day | ORAL | Status: DC
  Administered 2024-01-24 – 2024-01-25 (×2): 50 mg via ORAL
  Filled 2024-01-24 (×2): qty 1

## 2024-01-24 MED ORDER — GUAIFENESIN ER 600 MG PO TB12
600.0000 mg | ORAL_TABLET | Freq: Two times a day (BID) | ORAL | Status: DC
  Administered 2024-01-24 – 2024-01-25 (×3): 600 mg via ORAL
  Filled 2024-01-24 (×3): qty 1

## 2024-01-24 MED ORDER — GUAIFENESIN-CODEINE 100-10 MG/5ML PO SOLN
5.0000 mL | Freq: Four times a day (QID) | ORAL | Status: DC | PRN
  Administered 2024-01-24 – 2024-01-25 (×3): 5 mL via ORAL
  Filled 2024-01-24 (×3): qty 5

## 2024-01-24 MED ORDER — REVEFENACIN 175 MCG/3ML IN SOLN
175.0000 ug | Freq: Every day | RESPIRATORY_TRACT | Status: DC
  Administered 2024-01-24 – 2024-01-25 (×2): 175 ug via RESPIRATORY_TRACT
  Filled 2024-01-24 (×2): qty 3

## 2024-01-24 MED ORDER — HYDRALAZINE HCL 25 MG PO TABS: 25.0000 mg | ORAL_TABLET | Freq: Four times a day (QID) | ORAL | Status: DC | PRN

## 2024-01-24 MED ORDER — ARFORMOTEROL TARTRATE 15 MCG/2ML IN NEBU
15.0000 ug | INHALATION_SOLUTION | Freq: Two times a day (BID) | RESPIRATORY_TRACT | Status: DC
  Administered 2024-01-24 – 2024-01-25 (×2): 15 ug via RESPIRATORY_TRACT
  Filled 2024-01-24 (×2): qty 2

## 2024-01-24 MED ORDER — BUDESONIDE 0.5 MG/2ML IN SUSP
0.5000 mg | Freq: Two times a day (BID) | RESPIRATORY_TRACT | Status: DC
  Administered 2024-01-24 – 2024-01-25 (×2): 0.5 mg via RESPIRATORY_TRACT
  Filled 2024-01-24 (×2): qty 2

## 2024-01-24 MED ORDER — IPRATROPIUM-ALBUTEROL 0.5-2.5 (3) MG/3ML IN SOLN: 3.0000 mL | RESPIRATORY_TRACT | Status: DC | PRN

## 2024-01-24 MED ORDER — METOPROLOL SUCCINATE ER 50 MG PO TB24: 50.0000 mg | ORAL_TABLET | Freq: Every day | ORAL | Status: DC

## 2024-01-24 NOTE — Plan of Care (Signed)
" °  Problem: Coping: Goal: Ability to adjust to condition or change in health will improve Outcome: Progressing   Problem: Fluid Volume: Goal: Ability to maintain a balanced intake and output will improve Outcome: Progressing   Problem: Metabolic: Goal: Ability to maintain appropriate glucose levels will improve Outcome: Progressing   Problem: Nutritional: Goal: Maintenance of adequate nutrition will improve Outcome: Progressing   Problem: Skin Integrity: Goal: Risk for impaired skin integrity will decrease Outcome: Progressing   Problem: Education: Goal: Knowledge of General Education information will improve Description: Including pain rating scale, medication(s)/side effects and non-pharmacologic comfort measures Outcome: Progressing   Problem: Clinical Measurements: Goal: Ability to maintain clinical measurements within normal limits will improve Outcome: Progressing Goal: Will remain free from infection Outcome: Progressing Goal: Diagnostic test results will improve Outcome: Progressing   Problem: Activity: Goal: Risk for activity intolerance will decrease Outcome: Progressing   Problem: Nutrition: Goal: Adequate nutrition will be maintained Outcome: Progressing   Problem: Coping: Goal: Level of anxiety will decrease Outcome: Progressing   Problem: Elimination: Goal: Will not experience complications related to bowel motility Outcome: Progressing Goal: Will not experience complications related to urinary retention Outcome: Progressing   "

## 2024-01-24 NOTE — Progress Notes (Signed)
 " PROGRESS NOTE  Jacob Ali FMW:[id number] DOB: September 22, 1960   PCP: Elnor Lauraine BRAVO, NP  Patient is from: Home.  Lives alone.  DOA: 01/22/2024 LOS: 1  Chief complaints Chief Complaint  Patient presents with   Shortness of Breath     Brief Narrative / Interim history: 64 year old male with PMH of HTN, HLD, HIV (well-controlled on Biktarvy ), recent hospitalization at [facility] for hypoxic respiratory failure due to influenza A, and now returns with ongoing shortness of breath.  Further developed weakness, dyspnea on exertion, had follow-up with his PCP and was found to be hypoxic there, placed on 4 L nasal cannula and transferred to the ED.  He received nebulizers and Solu-Medrol  after which he reported feeling better.  CTA chest was negative for PE, but noted to have likely bronchopneumonia or aspiration for which he was started on antibiotics.  Started on antibiotics, steroid and breathing treatments and admitted for further care.  Subjective: Seen and examined earlier this morning.  No major events overnight or this morning.  Reports improvement in his breathing but continues to endorse chest congestion, productive cough with yellowish-greenish phlegm.  Denies chest pain or hemoptysis.  Denies GI or UTI symptoms.  Quit smoking end of last month.    Assessment and plan: Acute hypoxic respiratory failure COPD with acute exacerbation: Has known emphysema Bronchopneumonia -He did recently have influenza A.   -Continues to endorse chest congestion, productive cough with yellowish and greenish phlegm.   -Currently saturating in mid 90s on 2 L at rest. -Continue ceftriaxone , Zithromax , prednisone  -Add Brovana , Yupelri  and Pulmicort . -Change DuoNebs to as needed. -Mucolytics and antitussive -Changed Coreg  to Toprol -XL. -Congratulated on smoking cessation. - Wean oxygen .  Incentive spirometry.  OOB and ambulatory saturation.  Essential hypertension: BP slightly elevated. -Continue home  lisinopril  -Change Coreg  to Toprol -XL.  Beta-1 selective. -P.o. hydralazine  as needed.  NIDDM-2 with hyperglycemia: A1c 4.5%.  On Jardiance  at home.  Hyperglycemia likely due to steroid. Recent Labs  Lab 01/23/24 1236 01/23/24 1759 01/23/24 2127 01/24/24 0747 01/24/24 1133  GLUCAP 169* 163* 182* 128* 147*  -Continue home Jardiance  -Continue SSI  History of CAD: Denies chest pain. Hyperlipidemia - Continue home Plavix , statin, lisinopril  - Changed Coreg  to Toprol -XL as above.  HIV - on Biktarvy   HLD - on statin and fenofibrate    GERD - on Protonix   There is no height or weight on file to calculate BMI.           DVT prophylaxis:  enoxaparin  (LOVENOX ) injection 40 mg Start: 01/22/24 2200  Code Status: Full code Family Communication: None at bedside Level of care: Med-Surg Status is: Inpatient Remains inpatient appropriate because: Acute respiratory failure with hypoxia due to COPD exacerbation and bronchopneumonia   Final disposition: Home   55 minutes with more than 50% spent in reviewing records, counseling patient/family and coordinating care.  Consultants:  None  Procedures: None  Microbiology summarized: None  Objective: Vitals:   01/23/24 2221 01/24/24 0436 01/24/24 0824 01/24/24 1226  BP:  (!) 157/79    Pulse:  71    Resp:  18    Temp:  98 F (36.7 C)    TempSrc:      SpO2: 97% 95% 95% 96%    Examination:  GENERAL: No apparent distress.  Nontoxic. HEENT: MMM.  Vision and hearing grossly intact.  NECK: Supple.  No apparent JVD.  RESP:  No IWOB.  Fair aeration bilaterally.  Rhonchi and frequent cough during exam CVS:  RRR. Heart  sounds normal.  ABD/GI/GU: BS+. Abd soft, NTND.  MSK/EXT:  Moves extremities. No apparent deformity. No edema.  SKIN: no apparent skin lesion or wound NEURO: AA.  Oriented appropriately.  No apparent focal neuro deficit. PSYCH: Calm. Normal affect.   Sch Meds:  Scheduled Meds:  azithromycin   500 mg Oral  Daily   bictegravir-emtricitabine -tenofovir  AF  1 tablet Oral Daily   budesonide  (PULMICORT ) nebulizer solution  0.5 mg Nebulization BID   clopidogrel   75 mg Oral Daily   empagliflozin   10 mg Oral Daily   enoxaparin  (LOVENOX ) injection  40 mg Subcutaneous Q24H   feeding supplement  237 mL Oral BID BM   fenofibrate   54 mg Oral Daily   fluticasone   1 spray Each Nare BID   guaiFENesin   600 mg Oral BID   insulin  aspart  0-15 Units Subcutaneous TID WC   insulin  aspart  0-5 Units Subcutaneous QHS   ipratropium-albuterol   3 mL Nebulization QID   lisinopril   20 mg Oral Daily   metoprolol  succinate  50 mg Oral Daily   pantoprazole   40 mg Oral Daily   predniSONE   40 mg Oral Q breakfast   rosuvastatin   40 mg Oral Daily   Continuous Infusions:  cefTRIAXone  (ROCEPHIN )  IV Stopped (01/23/24 2239)   PRN Meds:.acetaminophen  **OR** acetaminophen , albuterol , guaiFENesin -dextromethorphan , hydrALAZINE , ondansetron  **OR** ondansetron  (ZOFRAN ) IV, traZODone   Antimicrobials: Anti-infectives (From admission, onward)    Start     Dose/Rate Route Frequency Ordered Stop   01/22/24 2200  bictegravir-emtricitabine -tenofovir  AF (BIKTARVY ) 50-200-25 MG per tablet 1 tablet        1 tablet Oral Daily 01/22/24 1937     01/22/24 2115  azithromycin  (ZITHROMAX ) 500 mg in sodium chloride  0.9 % 250 mL IVPB  Status:  Discontinued        500 mg 250 mL/hr over 60 Minutes Intravenous Every 24 hours 01/22/24 2110 01/22/24 2112   01/22/24 2115  cefTRIAXone  (ROCEPHIN ) 1 g in sodium chloride  0.9 % 100 mL IVPB        1 g 200 mL/hr over 30 Minutes Intravenous Every 24 hours 01/22/24 2110     01/22/24 2115  azithromycin  (ZITHROMAX ) tablet 500 mg        500 mg Oral Daily 01/22/24 2113          I have personally reviewed the following labs and images: CBC: Recent Labs  Lab 01/22/24 1750 01/23/24 0301  WBC 17.1* 13.7*  NEUTROABS 14.9*  --   HGB 11.9* 11.4*  HCT 36.6* 34.8*  MCV 101.1* 101.2*  PLT 463* 435*   BMP  &GFR Recent Labs  Lab 01/22/24 1750 01/23/24 0301  NA 137 134*  K 4.3 4.3  CL 104 105  CO2 20* 19*  GLUCOSE 109* 205*  BUN 20 20  CREATININE 1.05 0.96  CALCIUM  9.2 8.6*   Estimated Creatinine Clearance: 76.7 mL/min (by C-G formula based on SCr of 0.96 mg/dL). Liver & Pancreas: Recent Labs  Lab 01/22/24 1750  AST 30  ALT 29  ALKPHOS 103  BILITOT 0.3  PROT 7.5  ALBUMIN 3.8   Recent Labs  Lab 01/22/24 1750  LIPASE 26   No results for input(s): AMMONIA in the last 168 hours. Diabetic: Recent Labs    01/22/24 2013  HGBA1C 4.5*   Recent Labs  Lab 01/23/24 1236 01/23/24 1759 01/23/24 2127 01/24/24 0747 01/24/24 1133  GLUCAP 169* 163* 182* 128* 147*   Cardiac Enzymes: No results for input(s): CKTOTAL, CKMB, CKMBINDEX, TROPONINI in the last 168  hours. No results for input(s): PROBNP in the last 8760 hours. Coagulation Profile: No results for input(s): INR, PROTIME in the last 168 hours. Thyroid  Function Tests: No results for input(s): TSH, T4TOTAL, FREET4, T3FREE, THYROIDAB in the last 72 hours. Lipid Profile: No results for input(s): CHOL, HDL, LDLCALC, TRIG, CHOLHDL, LDLDIRECT in the last 72 hours. Anemia Panel: No results for input(s): VITAMINB12, FOLATE, FERRITIN, TIBC, IRON, RETICCTPCT in the last 72 hours. Urine analysis:    Component Value Date/Time   COLORURINE YELLOW 01/14/2024 1416   APPEARANCEUR CLEAR 01/14/2024 1416   LABSPEC 1.026 01/14/2024 1416   PHURINE 5.0 01/14/2024 1416   GLUCOSEU >=500 (A) 01/14/2024 1416   HGBUR NEGATIVE 01/14/2024 1416   BILIRUBINUR NEGATIVE 01/14/2024 1416   BILIRUBINUR neg 09/30/2014 0910   KETONESUR 5 (A) 01/14/2024 1416   PROTEINUR NEGATIVE 01/14/2024 1416   UROBILINOGEN 0.2 09/30/2014 0910   UROBILINOGEN 0.2 05/25/2014 0903   NITRITE NEGATIVE 01/14/2024 1416   LEUKOCYTESUR NEGATIVE 01/14/2024 1416   Sepsis Labs: Invalid input(s): PROCALCITONIN,  LACTICIDVEN  Microbiology: No results found for this or any previous visit (from the past 240 hours).  Radiology Studies: No results found.    Aliena Ghrist T. Kataleena Holsapple [facility]  If 7PM-7AM, please contact night-coverage [email or web address] 01/24/2024, 12:29 PM   "

## 2024-01-24 NOTE — Plan of Care (Signed)

## 2024-01-24 NOTE — Care Plan (Cosign Needed)
 SATURATION QUALIFICATIONS: (This note is used to comply with regulatory documentation for home oxygen )  Patient Saturations on Room Air at Rest = 96%  Patient Saturations on Room Air while Ambulating = 92%  Patient Saturations on 2 Liters of oxygen  while Ambulating = 98%  Please briefly explain why patient needs home oxygen : pt does not require oxygen  at this time, pt saturations above 92% on room air

## 2024-01-24 NOTE — Progress Notes (Signed)
" °   01/24/24 2329  BiPAP/CPAP/SIPAP  BiPAP/CPAP/SIPAP Pt Type Adult  BiPAP/CPAP/SIPAP Resmed  Mask Type Full face mask  Mask Size Small  Respiratory Rate 18 breaths/min  FiO2 (%) 21 %  Patient Home Machine Yes  Safety Check Completed by RT for Home Unit Yes, no issues noted  Patient Home Mask Yes  Patient Home Tubing Yes  Device Plugged into RED Power Outlet Yes  BiPAP/CPAP /SiPAP Vitals  Pulse Rate 74  Resp 18  SpO2 93 %  Bilateral Breath Sounds Clear;Diminished  MEWS Score/Color  MEWS Score 0  MEWS Score Color Green    "

## 2024-01-25 ENCOUNTER — Other Ambulatory Visit (HOSPITAL_COMMUNITY): Payer: Self-pay

## 2024-01-25 DIAGNOSIS — I10 Essential (primary) hypertension: Secondary | ICD-10-CM

## 2024-01-25 DIAGNOSIS — J9601 Acute respiratory failure with hypoxia: Secondary | ICD-10-CM | POA: Diagnosis not present

## 2024-01-25 DIAGNOSIS — J180 Bronchopneumonia, unspecified organism: Secondary | ICD-10-CM | POA: Diagnosis not present

## 2024-01-25 DIAGNOSIS — J441 Chronic obstructive pulmonary disease with (acute) exacerbation: Secondary | ICD-10-CM | POA: Diagnosis not present

## 2024-01-25 LAB — RENAL FUNCTION PANEL
Albumin: 3.8 g/dL (ref 3.5–5.0)
Anion gap: 12 (ref 5–15)
BUN: 20 mg/dL (ref 8–23)
CO2: 22 mmol/L (ref 22–32)
Calcium: 9.4 mg/dL (ref 8.9–10.3)
Chloride: 105 mmol/L (ref 98–111)
Creatinine, Ser: 0.93 mg/dL (ref 0.61–1.24)
GFR, Estimated: >60 mL/min
Glucose, Bld: 117 mg/dL — ABNORMAL HIGH (ref 70–99)
Phosphorus: 3 mg/dL (ref 2.5–4.6)
Potassium: 4.2 mmol/L (ref 3.5–5.1)
Sodium: 139 mmol/L (ref 135–145)

## 2024-01-25 LAB — CBC
HCT: 34.6 % — ABNORMAL LOW (ref 39.0–52.0)
Hemoglobin: 11.3 g/dL — ABNORMAL LOW (ref 13.0–17.0)
MCH: 32.6 pg (ref 26.0–34.0)
MCHC: 32.7 g/dL (ref 30.0–36.0)
MCV: 99.7 fL (ref 80.0–100.0)
Platelets: 430 K/uL — ABNORMAL HIGH (ref 150–400)
RBC: 3.47 MIL/uL — ABNORMAL LOW (ref 4.22–5.81)
RDW: 13.2 % (ref 11.5–15.5)
WBC: 13.1 K/uL — ABNORMAL HIGH (ref 4.0–10.5)
nRBC: 0 % (ref 0.0–0.2)

## 2024-01-25 LAB — GLUCOSE, CAPILLARY
Glucose-Capillary: 111 mg/dL — ABNORMAL HIGH (ref 70–99)
Glucose-Capillary: 139 mg/dL — ABNORMAL HIGH (ref 70–99)

## 2024-01-25 LAB — MAGNESIUM: Magnesium: 2.7 mg/dL — ABNORMAL HIGH (ref 1.7–2.4)

## 2024-01-25 MED ORDER — AMLODIPINE BESYLATE 5 MG PO TABS
5.0000 mg | ORAL_TABLET | Freq: Every day | ORAL | Status: DC
  Administered 2024-01-25: 5 mg via ORAL
  Filled 2024-01-25: qty 1

## 2024-01-25 MED ORDER — AMLODIPINE BESYLATE 5 MG PO TABS
5.0000 mg | ORAL_TABLET | Freq: Every day | ORAL | 0 refills | Status: AC
  Filled 2024-01-25: qty 90, 90d supply, fill #0

## 2024-01-25 MED ORDER — UMECLIDINIUM-VILANTEROL 62.5-25 MCG/ACT IN AEPB
1.0000 | INHALATION_SPRAY | Freq: Every day | RESPIRATORY_TRACT | 0 refills | Status: DC
  Filled 2024-01-25: qty 60, 60d supply, fill #0

## 2024-01-25 MED ORDER — PREDNISONE 10 MG PO TABS
30.0000 mg | ORAL_TABLET | Freq: Every day | ORAL | 0 refills | Status: AC
  Filled 2024-01-25: qty 6, 2d supply, fill #0

## 2024-01-25 MED ORDER — GUAIFENESIN-CODEINE 100-10 MG/5ML PO SOLN
5.0000 mL | Freq: Four times a day (QID) | ORAL | 0 refills | Status: DC | PRN
  Filled 2024-01-25: qty 120, 6d supply, fill #0

## 2024-01-25 MED ORDER — METOPROLOL SUCCINATE ER 50 MG PO TB24
50.0000 mg | ORAL_TABLET | Freq: Every day | ORAL | 0 refills | Status: AC
  Filled 2024-01-25: qty 90, 90d supply, fill #0

## 2024-01-25 MED ORDER — AMOXICILLIN-POT CLAVULANATE 875-125 MG PO TABS
1.0000 | ORAL_TABLET | Freq: Two times a day (BID) | ORAL | 0 refills | Status: AC
  Filled 2024-01-25: qty 4, 2d supply, fill #0

## 2024-01-25 MED ORDER — ALBUTEROL SULFATE HFA 108 (90 BASE) MCG/ACT IN AERS
2.0000 | INHALATION_SPRAY | Freq: Four times a day (QID) | RESPIRATORY_TRACT | 2 refills | Status: AC | PRN
  Filled 2024-01-25: qty 6.7, 30d supply, fill #0

## 2024-01-25 MED ORDER — UMECLIDINIUM-VILANTEROL 62.5-25 MCG/ACT IN AEPB
1.0000 | INHALATION_SPRAY | Freq: Every day | RESPIRATORY_TRACT | Status: DC
  Filled 2024-01-25: qty 14

## 2024-01-25 MED ORDER — GUAIFENESIN ER 600 MG PO TB12: 600.0000 mg | ORAL_TABLET | Freq: Two times a day (BID) | ORAL | Status: AC

## 2024-01-25 NOTE — Discharge Summary (Signed)
 "  Physician Discharge Summary  Jacob Ali FMW:[id number] DOB: 1960/03/14 DOA: 01/22/2024  PCP: Elnor Lauraine BRAVO, NP  Admit date: 01/22/2024 Discharge date: 01/25/2024  Admitted From: Home Disposition: Home Recommendations for Outpatient Follow-up:  Outpatient follow-up with PCP in 1 to 2 weeks Reassess blood pressure, CMP and CBC at follow-up Consider referral to pulmonology for PFT Please follow up on the following pending results: None  Home Health: No need identified Equipment/Devices: No need identified  Discharge Condition: Stable CODE STATUS: Full code   Follow-up Information     Elnor Lauraine BRAVO, NP. Schedule an appointment as soon as possible for a visit in 1 week(s).   Specialty: Nurse Practitioner Contact information: [address] [place] [address] [phone]                 Hospital course 64 year old male with PMH of HTN, HLD, HIV (well-controlled on Biktarvy ), recent hospitalization at [facility] for hypoxic respiratory failure due to influenza A, and now returns with ongoing shortness of breath.  Further developed weakness, dyspnea on exertion, had follow-up with his PCP and was found to be hypoxic there, placed on 4 L nasal cannula and transferred to the ED.  He received nebulizers and Solu-Medrol  after which he reported feeling better.  CTA chest was negative for PE, but noted to have likely bronchopneumonia or aspiration for which he was started on antibiotics.  Started on ceftriaxone , Zithromax , systemic steroid and breathing treatments and admitted for further care.   Patient's symptoms improved with treatment as above.  On the day of discharge, liberated of oxygen  and maintained appropriate saturation on room air.  Felt well and ready to go home.  He is discharged on p.o. Augmentin  and p.o. prednisone  for 2 more days to complete a total of 5 days.  He has received azithromycin  for 4 days.  Started on Anoro Ellipta .      See individual problem list  below for more.   Problems addressed during this hospitalization Acute hypoxic respiratory failure: Resolved. COPD with acute exacerbation: Has known emphysema Bronchopneumonia -Recently diagnosed with influenza A.  Respiratory symptoms improved.  Liberated of oxygen . -Received ceftriaxone  for 3 days and Zithromax  for 4 days.  Discharged on p.o. Augmentin  for 2 more days. -Received Brovana , Yupelri  and Pulmicort  in house and discharged on Anoro Ellipta . -Albuterol  inhaler as needed -Mucolytics and antitussive as below -Changed Coreg  to Toprol -XL. -Congratulated on smoking cessation. -Consider ambulatory referral to pulmonology   Essential hypertension: BP slightly elevated. -Continue home lisinopril  -Changed Coreg  to Toprol -XL given COPD -Adding low-dose amlodipine  -Reassess and adjust meds as appropriate   NIDDM-2 with hyperglycemia: A1c 4.5%.  On Jardiance  at home.  Hyperglycemia likely due to steroid. -Continue Jardiance    History of CAD: Denies chest pain. Hyperlipidemia - Continue home Plavix , statin, lisinopril  - Changed Coreg  to Toprol -XL as above.   HIV - on Biktarvy    HLD - on statin and fenofibrate    GERD - on Protonix  There is no height or weight on file to calculate BMI.           Consultations:  Time spent 35  minutes  Vital signs Vitals:   01/25/24 0843 01/25/24 0918 01/25/24 0921 01/25/24 1206  BP:  (!) 153/75 (!) 159/74 (!) 145/67  Pulse:  78 78 80  Temp:    98.1 F (36.7 C)  Resp:    16  SpO2: 95%   93%  TempSrc:    Oral     Discharge exam  GENERAL: No apparent distress.  Nontoxic. HEENT: MMM.  Vision and hearing grossly intact.  NECK: Supple.  No apparent JVD.  RESP:  No IWOB.  Fair aeration bilaterally. CVS:  RRR. Heart sounds normal.  ABD/GI/GU: BS+. Abd soft, NTND.  MSK/EXT:  Moves extremities. No apparent deformity. No edema.  SKIN: no apparent skin lesion or wound NEURO: Awake and alert. Oriented appropriately.  No apparent  focal neuro deficit. PSYCH: Calm. Normal affect.   Discharge Instructions Discharge Instructions     Discharge instructions   Complete by: As directed    It has been a pleasure taking care of you!  You were hospitalized due to shortness of breath and cough likely from COPD exacerbation in the setting of pneumonia for which you have been treated with antibiotics, steroid and breathing treatments.  Your symptoms improved.  We are discharging you more of these medications to complete treatment course.  It is very important that you take your medications as prescribed.  Follow-up with your primary care doctor in 1 to 2 weeks or sooner if needed.   Take care,   Increase activity slowly   Complete by: As directed       Allergies as of 01/25/2024       Reactions   Atorvastatin  Other (See Comments)   myalgias   Metformin  And Related Other (See Comments)   Acute Kidney injury   Tamiflu  [oseltamivir  Phosphate] Other (See Comments)   Depressive symptoms   Tramadol  Anxiety, Other (See Comments)   Sleeplessness        Medication List     STOP taking these medications    carvedilol  6.25 MG tablet Commonly known as: COREG        TAKE these medications    Accu-Chek Guide Me w/Device Kit USE TO CHECK BLOOD SUGAR ONCE DAILY   acetaminophen  650 MG CR tablet Commonly known as: TYLENOL  Take 1,300 mg by mouth every evening.   albuterol  108 (90 Base) MCG/ACT inhaler Commonly known as: VENTOLIN  HFA Inhale 2 puffs into the lungs every 6 (six) hours as needed for wheezing or shortness of breath.   amLODipine  5 MG tablet Commonly known as: NORVASC  Take 1 tablet (5 mg total) by mouth daily.   amoxicillin -clavulanate 875-125 MG tablet Commonly known as: AUGMENTIN  Take 1 tablet by mouth 2 (two) times daily for 2 days.   BC HEADACHE POWDER PO Take 1 packet by mouth See admin instructions. Dissolve 1 packet on the tongue in the morning as needed for muscle tightness or pain    Biktarvy  50-200-25 MG Tabs tablet Generic drug: bictegravir-emtricitabine -tenofovir  AF Take 1 tablet by mouth daily.   BLOOD GLUCOSE TEST STRIPS Strp 1 each by In Vitro route daily. May substitute to any manufacturer covered by patient's insurance.   clopidogrel  75 MG tablet Commonly known as: PLAVIX  TAKE 1 TABLET BY MOUTH EVERY DAY   empagliflozin  10 MG Tabs tablet Commonly known as: Jardiance  Take 1 tablet (10 mg total) by mouth daily.   fenofibrate  48 MG tablet Commonly known as: TRICOR  Take 1 tablet (48 mg total) by mouth daily.   fluticasone  50 MCG/ACT nasal spray Commonly known as: FLONASE  PLACE 1 SPRAY INTO BOTH NOSTRILS 2 (TWO) TIMES DAILY What changed:  when to take this reasons to take this   guaiFENesin  600 MG 12 hr tablet Commonly known as: MUCINEX  Take 1 tablet (600 mg total) by mouth 2 (two) times daily for 5 days.   guaiFENesin -codeine  100-10 MG/5ML syrup Take 5 mLs by mouth every  6 (six) hours as needed for cough.   lisinopril  20 MG tablet Commonly known as: ZESTRIL  Take 1 tablet (20 mg total) by mouth daily.   MAGNESIUM  GLYCINATE PO Take 1 tablet by mouth at bedtime as needed (for sleep- HOLD FOR DIARRHEA).   metoprolol  succinate 50 MG 24 hr tablet Commonly known as: TOPROL -XL Take 1 tablet (50 mg total) by mouth daily. Take with or immediately following a meal.   nitroGLYCERIN  0.4 MG SL tablet Commonly known as: NITROSTAT  PLACE 1 TABLET UNDER THE TONGUE EVERY 5 MINUTES FOR 3 DOSES AS NEEDED FOR CHEST PAIN What changed: See the new instructions.   pantoprazole  40 MG tablet Commonly known as: PROTONIX  Take 1 tablet (40 mg total) by mouth daily. What changed: when to take this   predniSONE  10 MG tablet Commonly known as: DELTASONE  Take 3 tablets (30 mg total) by mouth daily with breakfast for 2 days. Start taking on: January 26, 2024   PRESCRIPTION MEDICATION CPAP- At bedtime   rosuvastatin  40 MG tablet Commonly known as: CRESTOR  TAKE 1  TABLET BY MOUTH EVERY DAY What changed: when to take this   umeclidinium-vilanterol 62.5-25 MCG/ACT Aepb Commonly known as: ANORO ELLIPTA  Inhale 1 puff into the lungs daily.   Unisom SleepTabs 25 MG tablet Generic drug: doxylamine (Sleep) Take 25 mg by mouth at bedtime.   VITAMIN B 12 PO Take 1 tablet by mouth See admin instructions. Take 1 tablet by mouth in the morning and evening         Procedures/Studies:   CT Angio Chest Pulmonary Embolism (PE) W or WO Contrast Result Date: 01/22/2024 EXAM: CTA of the Chest with contrast for PE 01/22/2024 08:41:01 PM TECHNIQUE: CTA of the chest was performed after the administration of intravenous contrast. Multiplanar reformatted images are provided for review. MIP images are provided for review. Automated exposure control, iterative reconstruction, and/or weight based adjustment of the mA/kV was utilized to reduce the radiation dose to as low as reasonably achievable. COMPARISON: Comparison with chest radiograph 01/22/2024 and CT chest 03/10/2023. CLINICAL HISTORY: Pulmonary embolism (PE) suspected, high prob. Shortness of breath and decreased oxygen  saturation. FINDINGS: PULMONARY ARTERIES: Pulmonary arteries are adequately opacified for evaluation. No pulmonary embolism. Main pulmonary artery is normal in caliber. MEDIASTINUM: The heart demonstrates normal size, no pericardial effusions, and calcification of the coronary arteries. Calcification of the aorta is present, with no dissection. The esophagus is decompressed. The thyroid  gland is unremarkable. LYMPH NODES: Scattered mediastinal lymph nodes, largest subcarinal nodes measuring 12 mm diameter. Similar appearance to prior study. Likely reactive. LUNGS AND PLEURA: Motion artifact limits evaluation of the lungs. Nodular peribronchial thickening is present, which is noted to be a good thing because it is not as bad as it is in the lung. Inflation is suggested throughout both lungs, greater on the  right. This is likely bronchopneumonia or aspiration. No pleural effusion or pneumothorax. UPPER ABDOMEN: Cholelithiasis with contracted gallbladder. No bile duct dilatation. SOFT TISSUES AND BONES: No acute bone or soft tissue abnormality. IMPRESSION: 1. No pulmonary embolism. 2. Nodular peribronchial thickening and inflammation throughout both lungs, greater on the right, likely representing bronchopneumonia or aspiration. Evaluation limited by motion artifact. 3. Scattered mediastinal lymph nodes, largest subcarinal nodes measuring 12 mm diameter, likely reactive and similar appearance to prior study. Electronically signed by: Elsie Gravely MD 01/22/2024 08:46 PM EST RP Workstation: [id number]   DG Chest Portable 1 View Result Date: 01/22/2024 CLINICAL DATA:  Hypoxia, shortness of breath EXAM: PORTABLE CHEST 1 VIEW  COMPARISON:  January 14, 2024 FINDINGS: The heart size and mediastinal contours are within normal limits. Both lungs are clear. The visualized skeletal structures are unremarkable. IMPRESSION: No active disease. Electronically Signed   By: Lynwood Landy Raddle M.D.   On: 01/22/2024 17:38   DG Chest Port 1 View Result Date: 01/14/2024 EXAM: 1 VIEW(S) XRAY OF THE CHEST 01/14/2024 09:39:20 AM COMPARISON: Bilateral rib series dated 02/25/2022. CLINICAL HISTORY: cough dyspnea FINDINGS: LINES, TUBES AND DEVICES: Multiple overlying monitor wires noted. LUNGS AND PLEURA: No focal pulmonary opacity. No pleural effusion. No pneumothorax. HEART AND MEDIASTINUM: Aortic atherosclerosis. No acute abnormality of the cardiac and mediastinal silhouettes. BONES AND SOFT TISSUES: No acute osseous abnormality. IMPRESSION: 1. No acute cardiopulmonary process. 2. Aortic atherosclerosis. Electronically signed by: Evalene Coho MD 01/14/2024 10:16 AM EST RP Workstation: [id number]       The results of significant diagnostics from this hospitalization (including imaging, microbiology, ancillary and laboratory) are  listed below for reference.     Microbiology: No results found for this or any previous visit (from the past 240 hours).   Labs:  CBC: Recent Labs  Lab 01/22/24 1750 01/23/24 0301 01/25/24 0447  WBC 17.1* 13.7* 13.1*  NEUTROABS 14.9*  --   --   HGB 11.9* 11.4* 11.3*  HCT 36.6* 34.8* 34.6*  MCV 101.1* 101.2* 99.7  PLT 463* 435* 430*   BMP &GFR Recent Labs  Lab 01/22/24 1750 01/23/24 0301 01/25/24 0447  NA 137 134* 139  K 4.3 4.3 4.2  CL 104 105 105  CO2 20* 19* 22  GLUCOSE 109* 205* 117*  BUN 20 20 20   CREATININE 1.05 0.96 0.93  CALCIUM  9.2 8.6* 9.4  MG  --   --  2.7*  PHOS  --   --  3.0   Estimated Creatinine Clearance: 79.2 mL/min (by C-G formula based on SCr of 0.93 mg/dL). Liver & Pancreas: Recent Labs  Lab 01/22/24 1750 01/25/24 0447  AST 30  --   ALT 29  --   ALKPHOS 103  --   BILITOT 0.3  --   PROT 7.5  --   ALBUMIN 3.8 3.8   Recent Labs  Lab 01/22/24 1750  LIPASE 26   No results for input(s): AMMONIA in the last 168 hours. Diabetic: Recent Labs    01/22/24 2013  HGBA1C 4.5*   Recent Labs  Lab 01/24/24 1133 01/24/24 1638 01/24/24 2106 01/25/24 0730 01/25/24 1205  GLUCAP 147* 212* 226* 111* 139*   Cardiac Enzymes: No results for input(s): CKTOTAL, CKMB, CKMBINDEX, TROPONINI in the last 168 hours. No results for input(s): PROBNP in the last 8760 hours. Coagulation Profile: No results for input(s): INR, PROTIME in the last 168 hours. Thyroid  Function Tests: No results for input(s): TSH, T4TOTAL, FREET4, T3FREE, THYROIDAB in the last 72 hours. Lipid Profile: No results for input(s): CHOL, HDL, LDLCALC, TRIG, CHOLHDL, LDLDIRECT in the last 72 hours. Anemia Panel: No results for input(s): VITAMINB12, FOLATE, FERRITIN, TIBC, IRON, RETICCTPCT in the last 72 hours. Urine analysis:    Component Value Date/Time   COLORURINE YELLOW 01/14/2024 1416   APPEARANCEUR CLEAR 01/14/2024 1416    LABSPEC 1.026 01/14/2024 1416   PHURINE 5.0 01/14/2024 1416   GLUCOSEU >=500 (A) 01/14/2024 1416   HGBUR NEGATIVE 01/14/2024 1416   BILIRUBINUR NEGATIVE 01/14/2024 1416   BILIRUBINUR neg 09/30/2014 0910   KETONESUR 5 (A) 01/14/2024 1416   PROTEINUR NEGATIVE 01/14/2024 1416   UROBILINOGEN 0.2 09/30/2014 0910   UROBILINOGEN 0.2 05/25/2014 0903  NITRITE NEGATIVE 01/14/2024 1416   LEUKOCYTESUR NEGATIVE 01/14/2024 1416   Sepsis Labs: Invalid input(s): PROCALCITONIN, LACTICIDVEN   SIGNED:  Christabell Loseke T Deletha Jaffee, MD  [facility] 01/25/2024, 4:54 PM   "

## 2024-01-25 NOTE — TOC Initial Note (Signed)
 Transition of Care [facility]) - Initial/Assessment Note    Patient Details  Name: Jacob Ali MRN: [id number] Date of Birth: May 03, 1960  [facility] of [facility]) CM/SW Contact:    Jacob Manuella Quill, RN Phone Number: 01/25/2024, 12:04 PM  Clinical Narrative:                 [place] w/ pt in room; pt said he lives at home w/ his roommate Jacob Ali [id number]); he plans to return w/ Mr Jacob Ali at d/c; his roommate will provide transportation; insurance/PCP verified; pt denied SDOH risks; pt has cane, walker, and transport chair; he does not receive HH services or home oxygen ; no IP CM needs.  Expected Discharge Plan: Home/Self Care Barriers to Discharge: No Barriers Identified   Patient Goals and CMS Choice Patient states their goals for this hospitalization and ongoing recovery are:: home     [place] ownership interest in [facility].provided to:: Patient    Expected Discharge Plan and Services   Discharge Planning Services: CM Consult   Living arrangements for the past 2 months: Single Family Home Expected Discharge Date: 01/25/24               DME Arranged: N/A DME Agency: NA       HH Arranged: NA HH Agency: NA        Prior Living Arrangements/Services Living arrangements for the past 2 months: Single Family Home Lives with:: Roommate Patient language and need for interpreter reviewed:: Yes Do you feel safe going back to the place where you live?: Yes      Need for Family Participation in Patient Care: Yes (Comment) Care giver Ali system in place?: Yes (comment) Current home services: DME (cane, walker, transport chair) Criminal Activity/Legal Involvement Pertinent to Current Situation/Hospitalization: No - Comment as needed  Activities of Daily Living   ADL Screening (condition at time of admission) Independently performs ADLs?: Yes (appropriate for developmental age) Is the patient deaf or have difficulty hearing?: No Does the  patient have difficulty seeing, even when wearing glasses/contacts?: No Does the patient have difficulty concentrating, remembering, or making decisions?: No  Permission Sought/Granted Permission sought to share information with : Case Manager Permission granted to share information with : Yes, Verbal Permission Granted  Share Information with NAME: Case Manager     Permission granted to share info w Relationship: Jacob Ali (roommate) [phone]     Emotional Assessment Appearance:: Appears stated age Attitude/Demeanor/Rapport: Gracious Affect (typically observed): Accepting Orientation: : Oriented to Self, Oriented to Place, Oriented to  Time, Oriented to Situation Alcohol / Substance Use: Not Applicable Psych Involvement: No (comment)  Admission diagnosis:  SOB (shortness of breath) [R06.02] Hypoxia [R09.02] Influenza [J11.1] Acute hypoxic respiratory failure (HCC) [J96.01] Patient Active Problem List   Diagnosis Date Noted   Acute hypoxic respiratory failure (HCC) 01/22/2024   Fever 01/21/2024   Influenza A 01/21/2024   Trigger finger of left hand 05/15/2023   Cervicalgia 03/21/2023   OSA on CPAP    Snoring 10/15/2022   PAD (peripheral artery disease) 10/15/2022   Overweight 09/12/2022   Needs flu shot 09/12/2022   Type 2 myocardial infarction (HCC) 04/14/2022   Nausea, vomiting, and diarrhea 03/29/2022   Preventative health care 03/01/2022   Black stools 01/08/2022   Chronic neck pain 07/12/2021   Chronic pain of both shoulders 07/12/2021   Sensation of fullness in both ears 04/05/2021   History of cocaine use 03/02/2021   CAD in native artery 01/31/2021  S/P angioplasty with stent 01/30/21 to LAD  01/31/2021   Unstable angina (HCC) 01/30/2021   Chest pain    B12 deficiency 09/13/2020   Family history of malignant neoplasm of digestive organs 08/08/2020   Rectal bleeding 08/08/2020   Body mass index (BMI) 27.0-27.9, adult 04/07/2020   Laceration of scalp  without complication 03/23/2020   Tubular adenoma of colon 11/03/2017   Type 2 diabetes mellitus (HCC) 06/18/2016   Hepatitis B immune 03/11/2016   Atherosclerosis with claudication of extremity 01/29/2016   Arthralgia 10/26/2014   Gastroesophageal reflux disease 06/23/2013   Tobacco use disorder 04/27/2013   Erectile dysfunction 10/27/2012   Insomnia 04/15/2012   Generalized anxiety disorder 02/26/2011    Class: Acute   Suicide attempt (HCC) 02/20/2011   HIV (human immunodeficiency virus infection) (HCC) 11/01/2010   Essential (primary) hypertension 11/01/2010   Hyperlipidemia 11/01/2010   PCP:  Jacob Lauraine BRAVO, NP Pharmacy:   [id number] [place], [place] - [address] [facility] [address] [place] [address] Phone: [phone] Fax: [phone]  Jacob Ali - [facility] [address] [place] [address] Phone: [phone] Fax: [phone]     Social Drivers of Health (SDOH) Social History: SDOH Screenings   Food Insecurity: No Food Insecurity (01/25/2024)  Housing: Low Risk (01/25/2024)  Transportation Needs: No Transportation Needs (01/25/2024)  Utilities: Not At Risk (01/25/2024)  Alcohol Screen: Low Risk (03/17/2023)  Depression (PHQ2-9): Low Risk (01/20/2024)  Financial Resource Strain: Medium Risk (03/17/2023)  Physical Activity: Insufficiently Active (03/17/2023)  Social Connections: Socially Isolated (01/23/2024)  Stress: Stress Concern Present (03/17/2023)  Tobacco Use: High Risk (01/23/2024)   SDOH Interventions: Food Insecurity Interventions: Intervention Not Indicated, Inpatient TOC Housing Interventions: Intervention Not Indicated, Inpatient TOC Transportation Interventions: Intervention Not Indicated, Inpatient TOC Utilities Interventions: Intervention Not Indicated, Inpatient TOC   Readmission Risk Interventions    01/25/2024   12:02 PM  Readmission Risk Prevention  Plan  Transportation Screening Complete  PCP or Specialist Appt within 5-7 Days Complete  Home Care Screening Complete  Medication Review (RN CM) Complete

## 2024-01-25 NOTE — Plan of Care (Signed)

## 2024-01-26 ENCOUNTER — Other Ambulatory Visit (HOSPITAL_COMMUNITY): Payer: Self-pay

## 2024-01-26 ENCOUNTER — Encounter: Payer: Self-pay | Admitting: *Deleted

## 2024-01-27 ENCOUNTER — Other Ambulatory Visit: Payer: Self-pay | Admitting: *Deleted

## 2024-01-27 NOTE — Patient Instructions (Addendum)
 Visit Information   Thank you for taking time to visit with me today. Please don't hesitate to contact me if I can be of assistance to you before our next scheduled telephone appointment.  Our next appointment is by telephone on Wednesday, February 04, 2024 at 10:00 am  Please call the care guide team at [phone] if you need to cancel or reschedule your appointment.   Patient Self Care Activities:  Attend all scheduled provider appointments Call pharmacy for medication refills 3-7 days in advance of running out of medications Call provider office for new concerns or questions  Participate in Transition of Care Program/Attend TOC scheduled calls Take medications as prescribed   Continue pacing activity to avoid episodes of shortness of breath and prevent falls If you believe your condition is getting worse- contact your care providers (doctors) promptly- reaching out to your doctor early when you have concerns can prevent you from having to go to the hospital  Problems:  Recent hospitalization December 31- January 16, 2024 for influenza-like illness; AKI Hospital re-admission January 8-11, 2026 for shortness of breath/ unresolved influenza; hypoxia: admitted from hospital follow up visit with PCP Independent: drives self; good support at home throughout roommate and local family members; no use of assistive devices; low fall risk (2) unplanned hospital admission x last (6)/ (12) months  Goal:  Over the next 30 days, the patient will not experience hospital readmission  01/27/24: TOC 30-day program re-enrollment after hospital readmission January 8- 11, 2026  Interventions:  Transitions of Care: week # 1/ day # 1 Durable Medical Equipment (DME) needs assessed with patient/caregiver Doctor Visits  - discussed the importance of doctor visits Communication with PCP re: successful re- enrollment into [facility] 30-day program Post discharge activity limitations prescribed by provider  reviewed Reviewed Signs and symptoms of infection Care Coordination outreach to scheduling care guide to switch routine PCP office visit 01/30/24 to Hospital follow up, no response received back from scheduling care guide: encouraged patient to attend PCP office visit as scheduled 01/30/24 Re- provided my direct contact information should questions/ concerns/ needs arise post-TOC initial call, prior to next TOC 30-day program RN CM telephone visit   Reinforced need for patient to stay hydrated and not over-do activity along with signs/ symptoms dehydration and corresponding action plan  Patient Self Care Activities:  Attend all scheduled provider appointments Call pharmacy for medication refills 3-7 days in advance of running out of medications Call provider office for new concerns or questions  Participate in Transition of Care Program/Attend TOC scheduled calls Take medications as prescribed   Continue pacing activity to avoid episodes of shortness of breath and prevent falls If you believe your condition is getting worse- contact your care providers (doctors) promptly- reaching out to your doctor early when you have concerns can prevent you from having to go to the hospital  Plan:  Telephone follow up appointment with care management team member scheduled for:  02/04/24- as scheduled If you are experiencing a Mental Health or Behavioral Health Crisis or need someone to talk to, please  call the Suicide and Crisis Lifeline: 988 call the [place]  National Suicide Prevention Lifeline: 219-875-9971 or TTY: 757-234-5497 TTY (332)374-2418) to talk to a trained counselor call 1-800-273-TALK (toll free, 24 hour hotline) go to [facility] [address] [place] [phone]) call the [facility] Crisis Line: [phone] call 911  Care plan and visit instructions communicated with the patient verbally today. Patient agrees to receive a copy  in MyChart.  Active MyChart status and patient understanding of how to access instructions and care plan via MyChart confirmed with patient.     Solara Goodchild Mckinney Tarig Zimmers, RN, BSN, Media Planner  Transitions of Care  [facility] - [facility] [phone]: direct office

## 2024-01-27 NOTE — Transitions of Care (Post Inpatient/ED Visit) (Signed)
 "  01/27/2024  Name: Jacob Ali MRN: [id number] DOB: 03/07/1960  Today's TOC FU Call Status: Today's TOC FU Call Status:: Successful TOC FU Call Completed TOC FU Call Complete Date: 01/27/24  Patient's Name and Date of Birth confirmed. Name, DOB  Transition Care Management Follow-up Telephone Call Date of Discharge: 01/25/24 Discharge Facility: [facility]) Type of Discharge: Inpatient Admission Primary Inpatient Discharge Diagnosis:: SOB; hypoxia; ongoing flu/ possible pneumonia How have you been since you were released from the hospital?: Better (I am finally better now that the hospital finally did something for me; I am like night- and day; they finally actually treated me and now that I have medication: I am better; pretty much back to my normal self; got much better care at [facility] than at [facility]) Any questions or concerns?: No  Items Reviewed: Did you receive and understand the discharge instructions provided?: Yes (thoroughly reviewed with patient who verbalizes good understanding of same) Medications obtained,verified, and reconciled?: Partial Review Completed (Partial medication review completed; confirmed patient obtained/ is taking all newly Rx'd medications as instructed; self-manages medications and denies questions/ concerns around medications today) Reason for Partial Mediation Review: Patient declined full medication review: stated I have been over all of them and don't need to go over them again Any new allergies since your discharge?: No Dietary orders reviewed?: Yes Type of Diet Ordered:: Regular, as healthy as possible Do you have support at home?: Yes People in Home [RPT]: friend(s) Name of Support/Comfort Primary Source: Reports independent in self-care activities; resides with supportive roommate/ friend - assists as/ if needed/ indicated  Medications Reviewed Today: Medications Reviewed Today     Reviewed by Minna Dumire M, RN (Registered Nurse) on  01/27/24 at 1512  Med List Status: <None>   Medication Order Taking? Sig Documenting Provider Last Dose Status Informant  acetaminophen  (TYLENOL ) 650 MG CR tablet [id number]  Take 1,300 mg by mouth every evening. [provider]  Active Self  albuterol  (VENTOLIN  HFA) 108 (90 Base) MCG/ACT inhaler [id number] Yes Inhale 2 puffs into the lungs every 6 (six) hours as needed for wheezing or shortness of breath. Gonfa, Taye T, MD  Active   amLODipine  (NORVASC ) 5 MG tablet [id number] Yes Take 1 tablet (5 mg total) by mouth daily. Gonfa, Taye T, MD  Active   amoxicillin -clavulanate (AUGMENTIN ) 875-125 MG tablet [id number] Yes Take 1 tablet by mouth 2 (two) times daily for 2 days. Gonfa, Taye T, MD  Active   Aspirin -Salicylamide-Caffeine [facility] HEADACHE POWDER PO) [id number]  Take 1 packet by mouth See admin instructions. Dissolve 1 packet on the tongue in the morning as needed for muscle tightness or pain [provider]  Active Self  bictegravir-emtricitabine -tenofovir  AF (BIKTARVY ) 50-200-25 MG TABS tablet [id number]  Take 1 tablet by mouth daily. Eben Reyes BROCKS, MD  Active Self  Blood Glucose Monitoring Suppl (ACCU-CHEK GUIDE ME) w/Device PRESSLEY [id number]  USE TO CHECK BLOOD SUGAR ONCE DAILY Webb, Padonda B, FNP  Active   clopidogrel  (PLAVIX ) 75 MG tablet [id number]  TAKE 1 TABLET BY MOUTH EVERY DAY Elnor Lauraine BRAVO, NP  Active Self  Cyanocobalamin  (VITAMIN B 12 PO) [id number]  Take 1 tablet by mouth See admin instructions. Take 1 tablet by mouth in the morning and evening [provider]  Active Self  empagliflozin  (JARDIANCE ) 10 MG TABS tablet [id number]  Take 1 tablet (10 mg total) by mouth daily. Elnor Lauraine BRAVO, NP  Active Self  fenofibrate  (TRICOR ) 48 MG tablet [id number]  Take 1  tablet (48 mg total) by mouth daily. Elnor Lauraine BRAVO, NP  Active Self  fluticasone  (FLONASE ) 50 MCG/ACT nasal spray [id number]  PLACE 1 SPRAY INTO BOTH NOSTRILS 2 (TWO) TIMES DAILY  Patient taking differently: Place 1  spray into both nostrils 2 (two) times daily as needed for allergies or rhinitis.   Gretta Comer POUR, NP  Active Self  Glucose Blood (BLOOD GLUCOSE TEST STRIPS) STRP [id number]  1 each by In Vitro route daily. May substitute to any manufacturer covered by patient's insurance. Elnor Lauraine BRAVO, NP  Active   guaiFENesin  (MUCINEX ) 600 MG 12 hr tablet [id number] Yes Take 1 tablet (600 mg total) by mouth 2 (two) times daily for 5 days.  Patient taking differently: Take 600 mg by mouth 2 (two) times daily. 01/27/24: reports taking another brans but only as needed   Gonfa, Taye T, MD  Active   guaiFENesin -codeine  100-10 MG/5ML syrup [id number] Yes Take 5 mLs by mouth every 6 (six) hours as needed for cough. Gonfa, Taye T, MD  Active   lisinopril  (ZESTRIL ) 20 MG tablet [id number]  Take 1 tablet (20 mg total) by mouth daily. Jeffrie Oneil BROCKS, MD  Active Self  MAGNESIUM  GLYCINATE PO [id number]  Take 1 tablet by mouth at bedtime as needed (for sleep- HOLD FOR DIARRHEA). [provider]  Active Self  metoprolol  succinate (TOPROL -XL) 50 MG 24 hr tablet [id number] Yes Take 1 tablet (50 mg total) by mouth daily. Take with or immediately following a meal. Gonfa, Taye T, MD  Active   nitroGLYCERIN  (NITROSTAT ) 0.4 MG SL tablet [id number]  PLACE 1 TABLET UNDER THE TONGUE EVERY 5 MINUTES FOR 3 DOSES AS NEEDED FOR CHEST PAIN  Patient taking differently: Place 0.4 mg under the tongue every 5 (five) minutes as needed for chest pain.   Marylu Leita SAUNDERS, NP  Active Self  pantoprazole  (PROTONIX ) 40 MG tablet [id number]  Take 1 tablet (40 mg total) by mouth daily.  Patient taking differently: Take 40 mg by mouth daily before breakfast.   Elnor Lauraine BRAVO, NP  Active Self  predniSONE  (DELTASONE ) 10 MG tablet [id number] Yes Take 3 tablets (30 mg total) by mouth daily with breakfast for 2 days. Gonfa, Taye T, MD  Active   PRESCRIPTION MEDICATION [id number]  CPAP- At bedtime [provider]  Active Self  rosuvastatin  (CRESTOR )  40 MG tablet [id number]  TAKE 1 TABLET BY MOUTH EVERY DAY  Patient taking differently: Take 40 mg by mouth at bedtime.   Jeffrie Oneil BROCKS, MD  Active Self  umeclidinium-vilanterol (ANORO ELLIPTA ) 62.5-25 MCG/ACT AEPB [id number] Yes Inhale 1 puff into the lungs daily. Gonfa, Taye T, MD  Active   UNISOM SLEEPTABS 25 MG tablet [id number]  Take 25 mg by mouth at bedtime. [provider]  Active Self           Home Care and Equipment/Supplies: Were Home Health Services Ordered?: No Any new equipment or medical supplies ordered?: No  Functional Questionnaire: Do you need assistance with bathing/showering or dressing?: No Do you need assistance with meal preparation?: No Do you need assistance with eating?: No Do you have difficulty maintaining continence: No Do you need assistance with getting out of bed/getting out of a chair/moving?: No Do you have difficulty managing or taking your medications?: No  Follow up appointments reviewed: PCP Follow-up appointment confirmed?: Yes Date of PCP follow-up appointment?: 01/30/24 (scheduled as routine office visit: care coordination outreach to scheduling care guide to change to hospital follow up office visit-  without response back from care guide) Follow-up Provider: PCP Specialist Hospital Follow-up appointment confirmed?: NA (verified not indicated per hospital discharging provider discharge notes) Do you need transportation to your follow-up appointment?: No Do you understand care options if your condition(s) worsen?: Yes-patient verbalized understanding  SDOH Interventions Today    Flowsheet Row Most Recent Value  SDOH Interventions   Food Insecurity Interventions Intervention Not Indicated  Housing Interventions Intervention Not Indicated  Transportation Interventions Intervention Not Indicated  [continues to drive self,  roommate assists as-if indicated]  Utilities Interventions Intervention Not Indicated   See TOC assessment  tabs/ care plan for additional assessment/ TOC intervention information  I appreciate the opportunity to participate in Steve's care,  Pls call/ message for questions,  Avi Kerschner Mckinney Ubah Radke, RN, BSN, Media Planner  Transitions of Care  [facility] - [facility] [phone]: direct office  "

## 2024-01-30 ENCOUNTER — Ambulatory Visit (INDEPENDENT_AMBULATORY_CARE_PROVIDER_SITE_OTHER): Admitting: Nurse Practitioner

## 2024-01-30 VITALS — BP 150/76 | HR 78 | Temp 97.7°F | Ht 69.0 in | Wt 173.4 lb

## 2024-01-30 DIAGNOSIS — J180 Bronchopneumonia, unspecified organism: Secondary | ICD-10-CM | POA: Diagnosis not present

## 2024-01-30 DIAGNOSIS — I10 Essential (primary) hypertension: Secondary | ICD-10-CM | POA: Diagnosis not present

## 2024-01-30 MED ORDER — AMOXICILLIN-POT CLAVULANATE 875-125 MG PO TABS: 1.0000 | ORAL_TABLET | Freq: Two times a day (BID) | ORAL | 0 refills | Status: DC

## 2024-01-30 NOTE — Assessment & Plan Note (Signed)
 Hypertension Slightly elevated blood pressure, possibly due to steroid and inhaler use. Asymptomatic. - Continue current antihypertensive regimen. - Monitor blood pressure and symptoms post steroid and inhaler courses. - Scheduled follow-up in two weeks to reassess blood pressure and medication efficacy.

## 2024-01-30 NOTE — Progress Notes (Signed)
 "  Established Patient Office Visit  Subjective   Patient ID: Jacob Ali, male    DOB: April 14, 1960  Age: 64 y.o. MRN: [id number]  Chief Complaint  Patient presents with   Medical Management of Chronic Issues    Follow up on flu and pneumonia, pt stated he has gotten better than before    Discussed the use of AI scribe software for clinical note transcription with the patient, who gave verbal consent to proceed.  History of Present Illness Jacob Ali Jacob is a 64 year old male who presents for follow-up after hospitalization for pneumonia following influenza infection.  - Developed flu the week after Christmas, resulting in a three-day hospitalization and treatment with IV fluids - Initial improvement after discharge, followed by recurrent weakness and malaise - Returned to clinic and found to be hypoxic on room air. Was sent back to the hospital and treated for pnuemonia - Is much improved today. He is no longer experiencing shortness of breath. He does have some lingering fatigue, but able to complete ADLs.    Cardiovascular symptoms and blood pressure management - Antihypertensive regimen adjusted during hospitalization. Current regimen: amlodipine  5mg /day, lisinopril  20mg /day, metoprolol  50mg /day - Blood pressures have been running a bit high in the morning, but improve as the day progresses. He is asymptomatic - Recently completed a course of steroids - Experiences nervousness and jitteriness, which he attributes to the inhaler      Review of Systems  Eyes:  Negative for blurred vision.  Respiratory:  Positive for cough. Negative for shortness of breath.   Cardiovascular:  Negative for chest pain.  Neurological:  Negative for dizziness and headaches.      Objective:     BP (!) 150/76   Pulse 78   Temp 97.7 F (36.5 C) (Temporal)   Ht 5' 9 (1.753 m)   Wt 173 lb 6 oz (78.6 kg)   SpO2 97%   BMI 25.60 kg/m  BP Readings from Last 3 Encounters:  01/30/24  (!) 150/76  01/25/24 (!) 145/67  01/22/24 (!) 102/56   Wt Readings from Last 3 Encounters:  01/30/24 173 lb 6 oz (78.6 kg)  01/22/24 171 lb (77.6 kg)  01/14/24 182 lb (82.6 kg)      Physical Exam Vitals reviewed.  Constitutional:      Appearance: Normal appearance.  HENT:     Head: Normocephalic and atraumatic.  Cardiovascular:     Rate and Rhythm: Normal rate and regular rhythm.  Pulmonary:     Effort: Pulmonary effort is normal.     Breath sounds: Examination of the right-upper field reveals rhonchi. Rhonchi present.  Musculoskeletal:     Cervical back: Neck supple.  Skin:    General: Skin is warm and dry.  Neurological:     Mental Status: He is alert and oriented to person, place, and time.  Psychiatric:        Mood and Affect: Mood normal.        Behavior: Behavior normal.        Thought Content: Thought content normal.        Judgment: Judgment normal.    Estimated Creatinine Clearance: 79.2 mL/min (by C-G formula based on SCr of 0.93 mg/dL).   No results found for any visits on 01/30/24.    The ASCVD Risk score (Arnett DK, et al., 2019) failed to calculate for the following reasons:   Risk score cannot be calculated because patient has a medical history suggesting prior/existing ASCVD   * -  Cholesterol units were assumed    Assessment & Plan:   Problem List Items Addressed This Visit       Cardiovascular and Mediastinum   Essential (primary) hypertension   Hypertension Slightly elevated blood pressure, possibly due to steroid and inhaler use. Asymptomatic. - Continue current antihypertensive regimen. - Monitor blood pressure and symptoms post steroid and inhaler courses. - Scheduled follow-up in two weeks to reassess blood pressure and medication efficacy.        Respiratory   Bronchopneumonia - Primary   Bronchopneumonia Recent hospitalization with residual bronchial congestion. No significant respiratory distress, but residual congestion  noted. - Prescribed additional 5-day course of augmentin       Relevant Medications   amoxicillin -clavulanate (AUGMENTIN ) 875-125 MG tablet  Assessment and Plan Assessment & Plan Bronchopneumonia Recent hospitalization with residual bronchial congestion. No significant respiratory distress, but residual congestion noted. - Prescribed additional 5-day course of augmentin   Hypertension Slightly elevated blood pressure, possibly due to steroid and inhaler use. Asymptomatic. - Continue current antihypertensive regimen. - Monitor blood pressure and symptoms post steroid and inhaler courses. - Scheduled follow-up in two weeks to reassess blood pressure and medication efficacy.     Return in about 2 weeks (around 02/13/2024) for F/U with [name] - htn.    [name], NP  "

## 2024-01-30 NOTE — Assessment & Plan Note (Signed)
 Bronchopneumonia Recent hospitalization with residual bronchial congestion. No significant respiratory distress, but residual congestion noted. - Prescribed additional 5-day course of augmentin 

## 2024-02-04 ENCOUNTER — Other Ambulatory Visit: Payer: Self-pay

## 2024-02-04 ENCOUNTER — Other Ambulatory Visit (HOSPITAL_COMMUNITY): Payer: Self-pay

## 2024-02-04 ENCOUNTER — Other Ambulatory Visit: Payer: Self-pay | Admitting: *Deleted

## 2024-02-04 ENCOUNTER — Other Ambulatory Visit: Payer: Self-pay | Admitting: Infectious Diseases

## 2024-02-04 DIAGNOSIS — Z21 Asymptomatic human immunodeficiency virus [HIV] infection status: Secondary | ICD-10-CM

## 2024-02-04 MED ORDER — BIKTARVY 50-200-25 MG PO TABS
1.0000 | ORAL_TABLET | Freq: Every day | ORAL | 6 refills | Status: AC
  Filled 2024-02-04: qty 30, 30d supply, fill #0

## 2024-02-04 NOTE — Patient Instructions (Signed)
 Visit Information  Thank you for taking time to visit with me today. Please don't hesitate to contact me if I can be of assistance to you before our next scheduled telephone appointment.  Our next appointment is by telephone on Friday, February 13, 2024 at 10:00 am  Please call the care guide team at [phone] if you need to cancel or reschedule your appointment.   Following are the goals we discussed today:  Patient Self Care Activities:  Attend all scheduled provider appointments Call pharmacy for medication refills 3-7 days in advance of running out of medications Call provider office for new concerns or questions  Participate in Transition of Care Program/Attend TOC scheduled calls Take medications as prescribed   Continue pacing activity to avoid episodes of shortness of breath and prevent falls If you believe your condition is getting worse- contact your care providers (doctors) promptly- reaching out to your doctor early when you have concerns can prevent you from having to go to the hospital  If you are experiencing a Mental Health or Behavioral Health Crisis or need someone to talk to, please  call the Suicide and Crisis Lifeline: 988 call the [place]  National Suicide Prevention Lifeline: 506-080-0152 or TTY: 667-729-4329 TTY 5127479878) to talk to a trained counselor call 1-800-273-TALK (toll free, 24 hour hotline) go to [facility] [address] [place] [phone]) call the [facility] Crisis Line: [phone] call 911   Care plan and visit instructions communicated with the patient verbally today. Patient agrees to receive a copy in MyChart. Active MyChart status and patient understanding of how to access instructions and care plan via MyChart confirmed with patient.     Pls call/ message for questions,  Corde Antonini Mckinney Arzell Mcgeehan, RN, BSN, CCRN Alumnus RN Care Manager  Transitions of Care  [facility] - [facility]  Health [phone]: direct office

## 2024-02-04 NOTE — Transitions of Care (Post Inpatient/ED Visit) (Signed)
 " Transition of Care week 2/ day # 8  Visit Note  02/04/2024  Name: Jacob Ali MRN: [id number]          DOB: 07/01/1960  Situation: Patient enrolled in [facility] 30-day program. Visit completed with patient by telephone.   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  Everything is finally getting better    Denies clinical concerns and sounds to be in no distress throughout TOC 30-day program outreach call today  Patient Reported Symptoms: Cognitive Cognitive Status: Normal speech and language skills, Alert and oriented to person, place, and time, Insightful and able to interpret abstract concepts Cognitive/Intellectual Conditions Management [RPT]: None reported or documented in medical history or problem list      Neurological Neurological Review of Symptoms: No symptoms reported    HEENT HEENT Symptoms Reported: No symptoms reported      Cardiovascular Cardiovascular Symptoms Reported: No symptoms reported Does patient have  uncontrolled Hypertension?: No Cardiovascular Management Strategies: Routine screening, Medication therapy, Coping strategies, Adequate rest  Respiratory Respiratory Symptoms Reported: No symptoms reported Other Respiratory Symptoms: Confirmed taking recently extended antibiotics as prescribed- has 2 more days; I am breathing fine; the maintenance inhaler has run out- it was prescribed for only 7 days and I have completed the course; have not needed to use the rescue inhaler; Denies shortness of breath/ cough and sounds to be in no respiratory distress throughout [facility] call Respiratory Management Strategies: Medication therapy, Asthma action plan, Adequate rest, Coping strategies, Routine screening  Endocrine Endocrine Symptoms Reported: No symptoms reported Is patient diabetic?: Yes Is patient checking blood sugars at home?: Yes List most recent blood sugar readings, include date and time of day: Continues to monitor periodically at home: confirms blood sugars are all back to normal now that I am off the prednisone ;  Reports blood sugar this morning one hour after eating: 158  denied recent high/ low blood sugar values since last TOC outreach    Gastrointestinal Gastrointestinal Symptoms Reported: No symptoms reported Additional Gastrointestinal Details: :Good appetite; having regular and normal BM's; reports last bowel movement this morning      Genitourinary Genitourinary Symptoms Reported: No symptoms reported    Integumentary Integumentary Symptoms Reported: No symptoms reported    Musculoskeletal Additional Musculoskeletal Details: confirmed not currently requiring/ using assistive devices for ambulation as per baseline        Psychosocial Psychosocial Symptoms Reported: No symptoms  reported         Today's Vitals   02/04/24 1016  BP: (!) 111/58      Medications Reviewed Today     Reviewed by Lakresha Stifter M, RN (Registered Nurse) on 02/04/24 at 1005  Med List Status:  <None>   Medication Order Taking? Sig Documenting Provider Last Dose Status Informant  acetaminophen  (TYLENOL ) 650 MG CR tablet [id number]  Take 1,300 mg by mouth every evening. [provider]  Active Self  albuterol  (VENTOLIN  HFA) 108 (90 Base) MCG/ACT inhaler [id number]  Inhale 2 puffs into the lungs every 6 (six) hours as needed for wheezing or shortness of breath. Gonfa, Taye T, MD  Active   amLODipine  (NORVASC ) 5 MG tablet [id number]  Take 1 tablet (5 mg total) by mouth daily. Gonfa, Taye T, MD  Active   amoxicillin -clavulanate (AUGMENTIN ) 875-125 MG tablet [id number] Yes Take 1 tablet by mouth 2 (two) times daily. Elnor Lauraine BRAVO, NP  Active   Aspirin -Salicylamide-Caffeine [facility] HEADACHE POWDER PO) [id number]  Take 1 packet by mouth See admin instructions. Dissolve 1 packet on the tongue in the morning as needed for muscle tightness or pain [provider]  Active Self  bictegravir-emtricitabine -tenofovir  AF (BIKTARVY ) 50-200-25 MG TABS tablet [id number]  Take 1 tablet by mouth daily. Eben Reyes BROCKS, MD  Active Self  Blood Glucose Monitoring Suppl (ACCU-CHEK GUIDE ME) w/Device PRESSLEY [id number]  USE TO CHECK BLOOD SUGAR ONCE DAILY Webb, Padonda B, FNP  Active   clopidogrel  (PLAVIX ) 75 MG tablet [id number]  TAKE 1 TABLET BY MOUTH EVERY DAY Elnor Lauraine BRAVO, NP  Active Self  Cyanocobalamin  (VITAMIN B 12 PO) [id number]  Take 1 tablet by mouth See admin instructions. Take 1 tablet by mouth in the morning and evening [provider]  Active Self  empagliflozin  (JARDIANCE ) 10 MG TABS tablet [id number]  Take 1 tablet (10 mg total) by mouth daily. Elnor Lauraine BRAVO, NP  Active Self  fenofibrate  (TRICOR ) 48 MG tablet [id number]  Take 1 tablet (48 mg total) by mouth daily. Elnor Lauraine BRAVO, NP  Active Self  fluticasone  (FLONASE ) 50 MCG/ACT nasal spray [id number]  PLACE 1 SPRAY INTO BOTH NOSTRILS 2 (TWO) TIMES DAILY  Patient taking differently: Place 1 spray into both nostrils 2 (two) times daily as  needed for allergies or rhinitis.   Gretta Comer POUR, NP  Active Self  Glucose Blood (BLOOD GLUCOSE TEST STRIPS) STRP [id number]  1 each by In Vitro route daily. May substitute to any manufacturer covered by patient's insurance. Elnor Lauraine BRAVO, NP  Active   guaiFENesin -codeine  100-10 MG/5ML syrup [id number]  Take 5 mLs by mouth every 6 (six) hours as needed for cough. Gonfa, Taye T, MD  Active   lisinopril  (ZESTRIL ) 20 MG tablet [id number]  Take 1 tablet (20 mg total) by mouth daily. Jeffrie Oneil BROCKS, MD  Active Self  MAGNESIUM  GLYCINATE PO [id number]  Take 1 tablet by mouth at bedtime as needed (for sleep- HOLD FOR DIARRHEA). [provider]  Active Self  metoprolol  succinate (TOPROL -XL) 50 MG 24 hr tablet [id number]  Take 1 tablet (50 mg total) by mouth daily. Take with or immediately following a meal. Gonfa, Taye T, MD  Active   nitroGLYCERIN  (NITROSTAT ) 0.4 MG SL tablet [id number]  PLACE 1 TABLET UNDER THE TONGUE EVERY 5 MINUTES FOR 3 DOSES AS NEEDED FOR CHEST PAIN  Patient taking differently: Place 0.4 mg under the tongue every 5 (five) minutes as needed for chest pain.   Marylu Leita SAUNDERS,  NP  Active Self  pantoprazole  (PROTONIX ) 40 MG tablet [id number]  Take 1 tablet (40 mg total) by mouth daily.  Patient taking differently: Take 40 mg by mouth daily before breakfast.   Elnor Lauraine BRAVO, NP  Active Self  PRESCRIPTION MEDICATION [id number]  CPAP- At bedtime [provider]  Active Self  rosuvastatin  (CRESTOR ) 40 MG tablet [id number]  TAKE 1 TABLET BY MOUTH EVERY DAY  Patient taking differently: Take 40 mg by mouth at bedtime.   Jeffrie Oneil BROCKS, MD  Active Self  umeclidinium-vilanterol (ANORO ELLIPTA ) 62.5-25 MCG/ACT AEPB [id number]  Inhale 1 puff into the lungs daily.  Patient not taking: Reported on 02/04/2024   Gonfa, Taye T, MD  Active   UNISOM SLEEPTABS 25 MG tablet [id number]  Take 25 mg by mouth at bedtime. [provider]  Active Self           Recommendation:   PCP  Follow-up- as scheduled 02/12/24 Continue Current Plan of Care  Follow Up Plan:   Telephone follow-up in 1 week- as scheduled post- PCP appointment, on 02/13/24- per patient preference: confirmed patient has my direct number should concerns arise prior to next scheduled outreach  I appreciate the opportunity to participate in Steve's care,   Pls call/ message for questions,  Dave Mannes Mckinney Izear Pine, RN, BSN, CCRN Alumnus RN Care Manager  Transitions of Care  [facility] - [facility] [phone]: direct office     "

## 2024-02-05 ENCOUNTER — Other Ambulatory Visit: Payer: Self-pay

## 2024-02-06 ENCOUNTER — Other Ambulatory Visit: Payer: Self-pay

## 2024-02-06 ENCOUNTER — Other Ambulatory Visit: Payer: Self-pay | Admitting: Pharmacy Technician

## 2024-02-06 NOTE — Progress Notes (Signed)
 Specialty Pharmacy Refill Coordination Note  Jacob Ali is a 64 y.o. male contacted today regarding refills of specialty medication(s) Bictegravir-Emtricitab-Tenofov (Biktarvy )   Patient requested Delivery   Delivery date: 02/12/24   Verified address: [address]  [place]   Medication will be filled on: 02/11/24

## 2024-02-11 ENCOUNTER — Other Ambulatory Visit: Payer: Self-pay

## 2024-02-12 ENCOUNTER — Ambulatory Visit (INDEPENDENT_AMBULATORY_CARE_PROVIDER_SITE_OTHER): Admitting: Nurse Practitioner

## 2024-02-12 ENCOUNTER — Encounter: Payer: Self-pay | Admitting: Nurse Practitioner

## 2024-02-12 VITALS — BP 130/68 | HR 88 | Temp 98.0°F | Ht 69.0 in | Wt 178.4 lb

## 2024-02-12 DIAGNOSIS — D649 Anemia, unspecified: Secondary | ICD-10-CM | POA: Diagnosis not present

## 2024-02-12 DIAGNOSIS — I2510 Atherosclerotic heart disease of native coronary artery without angina pectoris: Secondary | ICD-10-CM | POA: Diagnosis not present

## 2024-02-12 DIAGNOSIS — Z0000 Encounter for general adult medical examination without abnormal findings: Secondary | ICD-10-CM | POA: Diagnosis not present

## 2024-02-12 DIAGNOSIS — Z7984 Long term (current) use of oral hypoglycemic drugs: Secondary | ICD-10-CM | POA: Diagnosis not present

## 2024-02-12 DIAGNOSIS — E1165 Type 2 diabetes mellitus with hyperglycemia: Secondary | ICD-10-CM | POA: Diagnosis not present

## 2024-02-12 DIAGNOSIS — D509 Iron deficiency anemia, unspecified: Secondary | ICD-10-CM

## 2024-02-12 LAB — CBC WITH DIFFERENTIAL/PLATELET
Basophils Absolute: 0.1 10*3/uL (ref 0.0–0.1)
Basophils Relative: 1.1 % (ref 0.0–3.0)
Eosinophils Absolute: 0.2 10*3/uL (ref 0.0–0.7)
Eosinophils Relative: 2.5 % (ref 0.0–5.0)
HCT: 42.3 % (ref 39.0–52.0)
Hemoglobin: 14 g/dL (ref 13.0–17.0)
Lymphocytes Relative: 40.5 % (ref 12.0–46.0)
Lymphs Abs: 3.5 10*3/uL (ref 0.7–4.0)
MCHC: 33.2 g/dL (ref 30.0–36.0)
MCV: 97.4 fl (ref 78.0–100.0)
Monocytes Absolute: 0.8 10*3/uL (ref 0.1–1.0)
Monocytes Relative: 8.9 % (ref 3.0–12.0)
Neutro Abs: 4 10*3/uL (ref 1.4–7.7)
Neutrophils Relative %: 47 % (ref 43.0–77.0)
Platelets: 225 10*3/uL (ref 150.0–400.0)
RBC: 4.34 Mil/uL (ref 4.22–5.81)
RDW: 15.3 % (ref 11.5–15.5)
WBC: 8.6 10*3/uL (ref 4.0–10.5)

## 2024-02-12 LAB — IRON: Iron: 84 ug/dL (ref 42–165)

## 2024-02-12 LAB — MICROALBUMIN / CREATININE URINE RATIO
Creatinine,U: 46.4 mg/dL
Microalb Creat Ratio: UNDETERMINED mg/g (ref 0.0–30.0)
Microalb, Ur: <0.7 mg/dL

## 2024-02-12 LAB — VITAMIN B12: Vitamin B-12: 1029 pg/mL — ABNORMAL HIGH (ref 211–911)

## 2024-02-12 LAB — FERRITIN: Ferritin: 126.5 ng/mL (ref 22.0–322.0)

## 2024-02-12 LAB — FOLATE: Folate: >23.4 ng/mL

## 2024-02-12 MED ORDER — NITROGLYCERIN 0.4 MG SL SUBL: 0.4000 mg | SUBLINGUAL_TABLET | SUBLINGUAL | 3 refills | Status: AC | PRN

## 2024-02-12 MED ORDER — FREESTYLE LIBRE 3 SENSOR MISC: 4 refills | Status: AC

## 2024-02-12 NOTE — Progress Notes (Signed)
 "  Chief Complaint  Patient presents with   Medicare Annual Wellness     Subjective:   DARTANION TEO is a 64 y.o. male who presents for a Welcome to Medicare Exam.   Visit info / Clinical Intake: Medicare Wellness Visit Type:: Welcome to [employer] (IPPE) Persons participating in visit and providing information:: patient Medicare Wellness Visit Mode:: In-person (required for WTM) Interpreter Needed?: No Pre-visit prep was completed: no AWV questionnaire completed by patient prior to visit?: no Living arrangements:: with family/others Patient's Overall Health Status Rating: good Typical amount of pain: some Does pain affect daily life?: no Are you currently prescribed opioids?: no  Dietary Habits and Nutritional Risks How many meals a day?: 3 Eats fruit and vegetables daily?: yes Most meals are obtained by: preparing own meals In the last 2 weeks, have you had any of the following?: none Diabetic:: (!) yes Any non-healing wounds?: no How often do you check your BS?: continuous glucose monitor Would you like to be referred to a Nutritionist or for Diabetic Management? : no  Functional Status Activities of Daily Living (to include ambulation/medication): Independent Ambulation: Independent Medication Administration: Independent Home Management (perform basic housework or laundry): Independent Manage your own finances?: yes Primary transportation is: driving Concerns about hearing?: no  Fall Screening Falls in the past year?: 0 (fall was due to ice) Number of falls in past year: 0 Was there an injury with Fall?: 0 Fall Risk Category Calculator: 0 Patient Fall Risk Level: Low Fall Risk  Fall Risk Patient at Risk for Falls Due to: No Fall Risks Fall risk Follow up: Falls evaluation completed  Home and Transportation Safety: All rugs have non-skid backing?: yes All stairs or steps have railings?: (!) no Grab bars in the bathtub or shower?: yes Have non-skid surface in  bathtub or shower?: yes Good home lighting?: yes Regular seat belt use?: yes Hospital stays in the last year:: (!) yes How many hospital stays:: 2  Cognitive Assessment Difficulty concentrating, remembering, or making decisions? : no What year is it?: 0 points (2026) What month is it?: 0 points (January) Give patient an address phrase to remember (5 components): [address] [place], [place] About what time is it?: 0 points Count backwards from 20 to 1: 0 points Say the months of the year in reverse: 0 points Repeat the address phrase from earlier: 0 points 6 CIT Score: 0 points  Advance Directives (For Healthcare) Does Patient Have a Medical Advance Directive?: No Would patient like information on creating a medical advance directive?: Yes (Inpatient - patient defers creating a medical advance directive at this time - Information given)  Reviewed/Updated  Reviewed/Updated: Patient Goals; Allergies; Medications; Medical History; Reviewed All (Medical, Surgical, Family, Medications, Allergies, Care Teams, Patient Goals)    Allergies (verified) Atorvastatin , Metformin  and related, Tamiflu  [oseltamivir  phosphate], and Tramadol    Current Medications (verified) Outpatient Encounter Medications as of 02/12/2024  Medication Sig   acetaminophen  (TYLENOL ) 650 MG CR tablet Take 1,300 mg by mouth every evening.   albuterol  (VENTOLIN  HFA) 108 (90 Base) MCG/ACT inhaler Inhale 2 puffs into the lungs every 6 (six) hours as needed for wheezing or shortness of breath.   amLODipine  (NORVASC ) 5 MG tablet Take 1 tablet (5 mg total) by mouth daily.   Aspirin -Salicylamide-Caffeine (BC HEADACHE POWDER PO) Take 1 packet by mouth See admin instructions. Dissolve 1 packet on the tongue in the morning as needed for muscle tightness or pain   bictegravir-emtricitabine -tenofovir  AF (BIKTARVY ) 50-200-25 MG TABS  tablet Take 1 tablet by mouth daily.   Blood Glucose Monitoring Suppl (ACCU-CHEK GUIDE ME) w/Device  KIT USE TO CHECK BLOOD SUGAR ONCE DAILY   clopidogrel  (PLAVIX ) 75 MG tablet TAKE 1 TABLET BY MOUTH EVERY DAY   Continuous Glucose Sensor (FREESTYLE LIBRE 3 SENSOR) MISC Place 1 sensor on the skin every 14 days. Use to check glucose continuously   Cyanocobalamin  (VITAMIN B 12 PO) Take 1 tablet by mouth See admin instructions. Take 1 tablet by mouth in the morning and evening   empagliflozin  (JARDIANCE ) 10 MG TABS tablet Take 1 tablet (10 mg total) by mouth daily.   fenofibrate  (TRICOR ) 48 MG tablet Take 1 tablet (48 mg total) by mouth daily.   fluticasone  (FLONASE ) 50 MCG/ACT nasal spray PLACE 1 SPRAY INTO BOTH NOSTRILS 2 (TWO) TIMES DAILY (Patient taking differently: Place 1 spray into both nostrils 2 (two) times daily as needed for allergies or rhinitis.)   Glucose Blood (BLOOD GLUCOSE TEST STRIPS) STRP 1 each by In Vitro route daily. May substitute to any manufacturer covered by patient's insurance.   lisinopril  (ZESTRIL ) 20 MG tablet Take 1 tablet (20 mg total) by mouth daily.   MAGNESIUM  GLYCINATE PO Take 1 tablet by mouth at bedtime as needed (for sleep- HOLD FOR DIARRHEA).   metoprolol  succinate (TOPROL -XL) 50 MG 24 hr tablet Take 1 tablet (50 mg total) by mouth daily. Take with or immediately following a meal.   pantoprazole  (PROTONIX ) 40 MG tablet Take 1 tablet (40 mg total) by mouth daily. (Patient taking differently: Take 40 mg by mouth daily before breakfast.)   PRESCRIPTION MEDICATION CPAP- At bedtime   rosuvastatin  (CRESTOR ) 40 MG tablet TAKE 1 TABLET BY MOUTH EVERY DAY (Patient taking differently: Take 40 mg by mouth at bedtime.)   UNISOM SLEEPTABS 25 MG tablet Take 25 mg by mouth at bedtime.   [DISCONTINUED] nitroGLYCERIN  (NITROSTAT ) 0.4 MG SL tablet PLACE 1 TABLET UNDER THE TONGUE EVERY 5 MINUTES FOR 3 DOSES AS NEEDED FOR CHEST PAIN (Patient taking differently: Place 0.4 mg under the tongue every 5 (five) minutes as needed for chest pain.)   nitroGLYCERIN  (NITROSTAT ) 0.4 MG SL tablet  Place 1 tablet (0.4 mg total) under the tongue every 5 (five) minutes as needed for chest pain. PLACE 1 TABLET UNDER THE TONGUE EVERY 5 MINUTES FOR 3 DOSES AS NEEDED FOR CHEST PAIN   [DISCONTINUED] amoxicillin -clavulanate (AUGMENTIN ) 875-125 MG tablet Take 1 tablet by mouth 2 (two) times daily.   [DISCONTINUED] guaiFENesin -codeine  100-10 MG/5ML syrup Take 5 mLs by mouth every 6 (six) hours as needed for cough.   [DISCONTINUED] umeclidinium-vilanterol (ANORO ELLIPTA ) 62.5-25 MCG/ACT AEPB Inhale 1 puff into the lungs daily. (Patient not taking: Reported on 02/04/2024)   No facility-administered encounter medications on file as of 02/12/2024.    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   Subdural hemorrhage (HCC)    Tubular adenoma of colon 11/03/2017   Colonoscopy Sept 9, 2019   Past Surgical History:  Procedure Laterality Date   CARDIAC CATHETERIZATION     CORONARY STENT INTERVENTION N/A 01/30/2021   Procedure: CORONARY STENT INTERVENTION;  Surgeon: Claudene Victory ORN, MD;  Location: [facility];  Service: Cardiovascular;  Laterality: N/A;   Examination under anesthesia, repair of anal fissure,  12/04/2000   EYE SURGERY  1960s accident   LEFT HEART CATH AND CORONARY ANGIOGRAPHY N/A 01/30/2021   Procedure: LEFT HEART CATH AND CORONARY ANGIOGRAPHY;  Surgeon: Claudene Victory ORN,  MD;  Location: [facility];  Service: Cardiovascular;  Laterality: N/A;   PERIPHERAL VASCULAR CATHETERIZATION Left 02/06/2016   Procedure: Lower Extremity Angiography;  Surgeon: Cordella KANDICE Shawl, MD;  Location: [facility];  Service: Cardiovascular;  Laterality: Left;   PERIPHERAL VASCULAR CATHETERIZATION N/A 02/06/2016   Procedure: Abdominal Aortogram w/Lower Extremity;  Surgeon: Cordella KANDICE Shawl, MD;  Location: [facility];  Service: Cardiovascular;  Laterality: N/A;   PERIPHERAL VASCULAR CATHETERIZATION  02/06/2016   Procedure: Lower Extremity Intervention;  Surgeon: Cordella KANDICE Shawl, MD;  Location: [facility];  Service: Cardiovascular;;   Family History  Problem Relation Age of Onset   Hypertension Mother    Asthma Mother    COPD Mother    Arthritis Mother    Heart disease Mother    Throat cancer Father    Heart disease Father    Cancer Father        bladder   Hypertension Father    Cancer Maternal Grandmother        colon   COPD Brother    Cancer Brother    Social History   Occupational History   Occupation: Marketing Executive: [employer]  Tobacco Use   Smoking status: Every Day    Types: Cigars   Smokeless tobacco: Never   Tobacco comments:    Currently using nicotine  patches  Vaping Use   Vaping status: Former  Substance and Sexual Activity   Alcohol use: Yes    Alcohol/week: 4.0 standard drinks of alcohol    Types: 4 Standard drinks or equivalent per week    Comment: occasionally   Drug use: Not Currently    Types: Marijuana    Comment: 2 per month   Sexual activity: Not Currently    Partners: Male    Birth control/protection: Condom    Comment: pt. given condoms   Tobacco Counseling Ready to quit: Not Answered Counseling given: Not Answered Tobacco comments: Currently using nicotine  patches  SDOH Screenings   Food Insecurity: No Food Insecurity (02/12/2024)  Housing: Unknown (02/12/2024)   Transportation Needs: No Transportation Needs (02/12/2024)  Utilities: Not At Risk (02/12/2024)  Alcohol Screen: Low Risk (01/27/2024)  Depression (PHQ2-9): Low Risk (02/12/2024)  Financial Resource Strain: Medium Risk (01/27/2024)  Physical Activity: Insufficiently Active (02/12/2024)  Social Connections: Socially Isolated (02/12/2024)  Stress: No Stress Concern Present (02/12/2024)  Recent Concern: Stress - Stress Concern Present (01/27/2024)  Tobacco Use: High Risk (01/23/2024)  Health Literacy: Adequate Health Literacy (02/12/2024)   See flowsheets for full screening details  Depression Screen PHQ 2 & 9 Depression Scale- Over the past 2 weeks, how often have you been bothered by any of the following problems? Little interest or pleasure in doing  things: 0 Feeling down, depressed, or hopeless (PHQ Adolescent also includes...irritable): 0 PHQ-2 Total Score: 0      Goals Addressed               This Visit's Progress     Patient Stated (pt-stated)        Pt stated his goal for this year is health ( joining silver sneakers), peaceful mind. Also to start a garden              Objective:    Today's Vitals   02/12/24 1015  BP: 130/68  Pulse: 88  Temp: 98 F (36.7 C)  TempSrc: Temporal  SpO2: 97%  Weight: 178 lb 6 oz (80.9 kg)  Height: 5' 9 (1.753 m)   Body mass index is 26.34 kg/m.   Physical Exam Vitals reviewed.  Constitutional:      General: He is not in acute distress.    Appearance: Normal appearance. He is not ill-appearing.  HENT:     Head: Normocephalic and atraumatic.     Right Ear: Tympanic membrane, ear canal and external ear normal.     Left Ear: Tympanic membrane, ear canal and external ear normal.  Eyes:     General: No scleral icterus.    Extraocular Movements: Extraocular movements intact.     Conjunctiva/sclera: Conjunctivae normal.     Pupils: Pupils are equal, round, and reactive to light.  Neck:     Vascular: No carotid bruit.   Cardiovascular:     Rate and Rhythm: Normal rate and regular rhythm.     Pulses: Normal pulses.     Heart sounds: Normal heart sounds.  Pulmonary:     Effort: Pulmonary effort is normal.     Breath sounds: Normal breath sounds.  Abdominal:     General: Bowel sounds are normal. There is no distension.     Palpations: There is no mass.     Tenderness: There is no abdominal tenderness.     Hernia: No hernia is present.  Musculoskeletal:        General: No swelling or tenderness.     Cervical back: Normal range of motion and neck supple. No rigidity.  Lymphadenopathy:     Cervical: No cervical adenopathy.  Skin:    General: Skin is warm and dry.  Neurological:     General: No focal deficit present.     Mental Status: He is alert and oriented to person, place, and time.     Cranial Nerves: No cranial nerve deficit.     Sensory: No sensory deficit.     Motor: No weakness.     Gait: Gait normal.  Psychiatric:        Mood and Affect: Mood normal.        Behavior: Behavior normal.        Judgment: Judgment normal.    Diabetic Foot Exam - Simple   Simple Foot Form Diabetic Foot exam was performed with the following findings: Yes 02/12/2024 11:16 AM  Visual Inspection No deformities, no ulcerations, no other skin breakdown bilaterally: Yes Sensation Testing Intact to touch and monofilament testing bilaterally: Yes Pulse Check Posterior Tibialis and Dorsalis pulse intact bilaterally: Yes Comments       Hearing/Vision screen Vision Screening   Right eye Left eye Both eyes  Without correction     With correction 20/25 20/25 20/25   Comments: Dr. Claudene Baseman eye care    Immunizations and Health Maintenance Health Maintenance  Topic Date Due  Hepatitis B Vaccines 19-59 Average Risk (1 of 3 - Risk 3-dose series) Never done   Diabetic kidney evaluation - Urine ACR  03/02/2023   COVID-19 Vaccine (7 - 2025-26 season) 09/15/2023   HEMOGLOBIN A1C  07/21/2024   OPHTHALMOLOGY  EXAM  12/23/2024   Diabetic kidney evaluation - eGFR measurement  01/24/2025   FOOT EXAM  02/11/2025   Medicare Annual Wellness (AWV)  02/11/2025   Colonoscopy  09/23/2027   DTaP/Tdap/Td (3 - Td or Tdap) 03/17/2030   Pneumococcal Vaccine: 50+ Years  Completed   Influenza Vaccine  Completed   Hepatitis C Screening  Completed   HIV Screening  Completed   Zoster Vaccines- Shingrix  Completed   Meningococcal B Vaccine  Aged Out   Lung Cancer Screening  Discontinued    EKG: normal sinus rhythm, nonspecific ST and T waves changes     Assessment/Plan:  This is a routine wellness examination for [place].  Patient Care Team: Elnor Lauraine BRAVO, NP as PCP - General (Nurse Practitioner) Darron Deatrice LABOR, MD as PCP - [facility] (Cardiology) Jeffrie Oneil BROCKS, MD as PCP - Cardiology (Cardiology) Kristie Lamprey, MD as Consulting Physician (Gastroenterology) Eben Reyes BROCKS, MD as Consulting Physician (Infectious Diseases) Lelon Glendia ONEIDA DEVONNA as Physician Assistant (Cardiology) Eben Reyes BROCKS, MD as Consulting Physician (Infectious Diseases) Tousey, Laine M, RN as [facility] Care Management  I have personally reviewed and noted the following in the patients chart:   Medical and social history Use of alcohol, tobacco or illicit drugs  Current medications and supplements including opioid prescriptions. Functional ability and status Nutritional status Physical activity Advanced directives List of other physicians Hospitalizations, surgeries, and ER visits in previous 12 months Vitals Screenings to include cognitive, depression, and falls Referrals and appointments  Orders Placed This Encounter  Procedures   Microalbumin / creatinine urine ratio    Standing Status:   Future    Number of Occurrences:   1    Expiration Date:   02/11/2025   Iron    Standing Status:   Future    Number of Occurrences:   1    Expiration Date:   02/11/2025   Ferritin    Standing Status:   Future    Number  of Occurrences:   1    Expiration Date:   02/11/2025   Vitamin B12    Standing Status:   Future    Number of Occurrences:   1    Expiration Date:   02/11/2025   Folate    Standing Status:   Future    Number of Occurrences:   1    Expiration Date:   02/11/2025   CBC with Differential/Platelet    Standing Status:   Future    Number of Occurrences:   1    Expected Date:   02/12/2024    Expiration Date:   02/11/2025   EKG 12-Lead    Assessment and Plan Assessment & Plan Atherosclerotic heart disease of native coronary artery EKG abnormal and voltage in V4 and V5 is changed compared to EKG from last year. Possibly suggests left ventricular enlargement? No recent chest pain. - Refilled nitroglycerin  prescription. - Instructed on nitroglycerin  use: take sublingually, repeat every 5 minutes up to three doses. If third dose is needed, call 911. - I will reach out to cardiologist to see if he feels EKG warrants repeat echocardiogram prior to follow-up in March - Ensure follow-up with cardiologist in March.  Type 2 diabetes mellitus with hyperglycemia  Blood sugars generally well-controlled with A1c below 6.5. Occasional neuropathy symptoms post-sugar intake due to acute spikes. - Continue current diabetes management plan. Jardiance  10mg /day, ACEI, statin - Monitor blood sugar levels closely - Foot exam completed today - Check urine for albuminuria  Anemia Chronic low hemoglobin, possibly anemia of chronic disease related to diabetes. Previous tests inconclusive. -Further labs ordered for evaluation      In addition, I have reviewed and discussed with patient certain preventive protocols, quality metrics, and best practice recommendations. A written personalized care plan for preventive services as well as general preventive health recommendations were provided to patient. An office visit was conducted as well as described above.    [name], NP   02/12/2024   Return in 3 months (on  05/12/2024) for 3 months OV with Mattilynn Forrer, and 1 year for AWV.  "

## 2024-02-13 ENCOUNTER — Other Ambulatory Visit: Payer: Self-pay

## 2024-02-13 ENCOUNTER — Other Ambulatory Visit: Payer: Self-pay | Admitting: *Deleted

## 2024-02-13 ENCOUNTER — Ambulatory Visit: Payer: Self-pay | Admitting: Nurse Practitioner

## 2024-02-13 NOTE — Transitions of Care (Post Inpatient/ED Visit) (Signed)
 " Transition of Care week 3/ day # 17  Visit Note  02/13/2024  Name: Jacob Ali MRN: [id number]          DOB: 28-Feb-1960  Situation: Patient enrolled in [facility] 30-day program. Visit completed with patient by telephone  HIPAA identifiers x 2 verified  Background:  Recent hospitalization December 31- January 16, 2024 for influenza-like illness; AKI Hospital re-admission January 8-11, 2026 for shortness of breath/ unresolved influenza; hypoxia: admitted from hospital follow up visit with PCP Independent: drives self; good support at home throughout roommate and local family members; no use of assistive devices; low fall risk (2) unplanned hospital admission x last (6)/ (12) months  Initial Transition Care Management Follow-up Telephone Call Discharge Date and Diagnosis: 01/25/24, SOB; hypoxia; ongoing flu/ possible pneumonia   Past Medical History:  Diagnosis Date   Allergy 40 years   seasonal   Anxiety    CAD in native artery 01/31/2021   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  They worked me in early at the dentist this morning for my next week appointment, so I am at the dentist office now- can you call back on Monday?  I am doing fine and have  no concerns at all today- even driving myself again and am back to all of my normal activities    Denies clinical concerns and sounds to be in no distress throughout [facility] 30-day program outreach call today  Patient Reported Symptoms: Cognitive Cognitive Status: Alert and oriented to person, place, and time, Normal speech and language skills, Insightful and able to interpret abstract concepts Cognitive/Intellectual Conditions Management [RPT]: None reported or documented in medical history or problem list      Neurological Neurological Review of Symptoms: No symptoms reported    HEENT HEENT Symptoms Reported: No symptoms reported      Cardiovascular Cardiovascular Symptoms Reported: No symptoms reported Does patient have uncontrolled Hypertension?: No Cardiovascular Management Strategies: Routine screening, Medication therapy, Coping strategies, Adequate rest  Respiratory Respiratory Symptoms Reported: No symptoms reported Other Respiratory Symptoms: Denies shortness of breath and sounds to be in no respiratory distress throughout TOC call: I am completely back to normal with my breathing now Respiratory Management Strategies: Medication therapy, Asthma action plan, Adequate rest, Coping strategies, Routine screening  Endocrine Endocrine Symptoms Reported: No symptoms reported    Gastrointestinal Gastrointestinal Symptoms Reported: No symptoms reported      Genitourinary Genitourinary Symptoms Reported: No symptoms reported    Integumentary Integumentary Symptoms Reported: No symptoms reported    Musculoskeletal Musculoskelatal Symptoms Reviewed: No symptoms reported  Psychosocial Psychosocial Symptoms Reported: No symptoms reported         There were no vitals filed for this visit.    Medications Reviewed Today     Reviewed by Rabon Scholle M, RN (Registered Nurse) on 02/13/24 at 1028  Med List Status: <None>   Medication Order Taking? Sig Documenting Provider  Last Dose Status Informant  acetaminophen  (TYLENOL ) 650 MG CR tablet [id number]  Take 1,300 mg by mouth every evening. [provider]  Active Self  albuterol  (VENTOLIN  HFA) 108 (90 Base) MCG/ACT inhaler [id number]  Inhale 2 puffs into the lungs every 6 (six) hours as needed for wheezing or shortness of breath. Gonfa, Taye T, MD  Active   amLODipine  (NORVASC ) 5 MG tablet [id number]  Take 1 tablet (5 mg total) by mouth daily. Gonfa, Taye T, MD  Active   Aspirin -Salicylamide-Caffeine [facility] HEADACHE POWDER PO) [id number]  Take 1 packet by mouth See admin instructions. Dissolve 1 packet on the tongue in the morning as needed for muscle tightness or pain [provider]  Active Self  bictegravir-emtricitabine -tenofovir  AF (BIKTARVY ) 50-200-25 MG TABS tablet [id number]  Take 1 tablet by mouth daily. Eben Reyes BROCKS, MD  Active   Blood Glucose Monitoring Suppl (ACCU-CHEK GUIDE ME) w/Device PRESSLEY [id number]  USE TO CHECK BLOOD SUGAR ONCE DAILY Webb, Padonda B, FNP  Active   clopidogrel  (PLAVIX ) 75 MG tablet [id number]  TAKE 1 TABLET BY MOUTH EVERY DAY Elnor Lauraine BRAVO, NP  Active Self  Continuous Glucose Sensor (FREESTYLE LIBRE 3 SENSOR) [place] [id number]  Place 1 sensor on the skin every 14 days. Use to check glucose continuously Elnor Lauraine BRAVO, NP  Active   Cyanocobalamin  (VITAMIN B 12 PO) [id number]  Take 1 tablet by mouth See admin instructions. Take 1 tablet by mouth in the morning and evening [provider]  Active Self  empagliflozin  (JARDIANCE ) 10 MG TABS tablet [id number]  Take 1 tablet (10 mg total) by mouth daily. Elnor Lauraine BRAVO, NP  Active Self  fenofibrate  (TRICOR ) 48 MG tablet [id number]  Take 1 tablet (48 mg total) by mouth daily. Elnor Lauraine BRAVO, NP  Active Self  fluticasone  (FLONASE ) 50 MCG/ACT nasal spray [id number]  PLACE 1 SPRAY INTO BOTH NOSTRILS 2 (TWO) TIMES DAILY  Patient taking differently: Place 1 spray into both nostrils 2 (two) times daily as needed for allergies or rhinitis.    Gretta Comer POUR, NP  Active Self  Glucose Blood (BLOOD GLUCOSE TEST STRIPS) STRP [id number]  1 each by In Vitro route daily. May substitute to any manufacturer covered by patient's insurance. Elnor Lauraine BRAVO, NP  Active   lisinopril  (ZESTRIL ) 20 MG tablet [id number]  Take 1 tablet (20 mg total) by mouth daily. Jeffrie Oneil BROCKS, MD  Active Self  MAGNESIUM  GLYCINATE PO [id number]  Take 1 tablet by mouth at bedtime as needed (for sleep- HOLD FOR DIARRHEA). [provider]  Active Self  metoprolol  succinate (TOPROL -XL) 50 MG 24 hr tablet [id number]  Take 1 tablet (50 mg total) by mouth daily. Take with or immediately following a meal. Gonfa, Taye T, MD  Active   nitroGLYCERIN  (NITROSTAT ) 0.4 MG SL tablet [id number]  Place 1 tablet (0.4 mg total) under the tongue every 5 (five) minutes as needed for chest pain. PLACE 1 TABLET UNDER THE TONGUE EVERY 5 MINUTES FOR 3 DOSES AS NEEDED FOR CHEST PAIN Elnor Lauraine BRAVO, NP  Active   pantoprazole  (PROTONIX ) 40 MG tablet [id number]  Take 1 tablet (40 mg total) by mouth  daily.  Patient taking differently: Take 40 mg by mouth daily before breakfast.   Elnor Lauraine BRAVO, NP  Active Self  PRESCRIPTION MEDICATION [id number]  CPAP- At bedtime [provider]  Active Self  rosuvastatin  (CRESTOR ) 40 MG tablet [id number]  TAKE 1 TABLET BY MOUTH EVERY DAY  Patient taking differently: Take 40 mg by mouth at bedtime.   Jeffrie Oneil BROCKS, MD  Active Self  UNISOM SLEEPTABS 25 MG tablet [id number]  Take 25 mg by mouth at bedtime. [provider]  Active Self           Recommendation:   Continue Current Plan of Care  Follow Up Plan:   Telephone follow-up in 1 week- as scheduled per patient preference 02/16/24  I appreciate the opportunity to participate in Steve's care,  Pls call/ message for questions,  Asli Tokarski Mckinney Bernell Haynie, RN, BSN, CCRN Alumnus RN Care Manager  Transitions of Care  [facility] - [facility] [phone]: direct  office     "

## 2024-02-13 NOTE — Patient Instructions (Signed)
 Visit Information  Thank you for taking time to visit with me today. Please don't hesitate to contact me if I can be of assistance to you before our next scheduled telephone appointment.  Our next appointment is by telephone on Monday, February 16, 2024 at 10:00 am  Please call the care guide team at [phone] if you need to cancel or reschedule your appointment.   Following are the goals we discussed today:  Patient Self Care Activities:  Attend all scheduled provider appointments Call pharmacy for medication refills 3-7 days in advance of running out of medications Call provider office for new concerns or questions  Participate in Transition of Care Program/Attend TOC scheduled calls Take medications as prescribed   Continue pacing activity to avoid episodes of shortness of breath and prevent falls If you believe your condition is getting worse- contact your care providers (doctors) promptly- reaching out to your doctor early when you have concerns can prevent you from having to go to the hospital  If you are experiencing a Mental Health or Behavioral Health Crisis or need someone to talk to, please  call the Suicide and Crisis Lifeline: 988 call the [place]  National Suicide Prevention Lifeline: (651) 326-3827 or TTY: (305)743-7619 TTY 214-166-5332) to talk to a trained counselor call 1-800-273-TALK (toll free, 24 hour hotline) go to [facility] [address] [place] [phone]) call the [facility] Crisis Line: [phone] call 911   Care plan and visit instructions communicated with the patient verbally today. Patient agrees to receive a copy in MyChart. Active MyChart status and patient understanding of how to access instructions and care plan via MyChart confirmed with patient.     Pls call/ message for questions,  Harlin Mazzoni Mckinney Cyndel Griffey, RN, BSN, CCRN Alumnus RN Care Manager  Transitions of Care  [facility] - [facility]  Health [phone]: direct office

## 2024-02-16 ENCOUNTER — Other Ambulatory Visit: Payer: Self-pay | Admitting: *Deleted

## 2024-02-16 NOTE — Patient Instructions (Addendum)
 Visit Information  Thank you for taking time to visit with me today. Please don't hesitate to contact me if I can be of assistance to you before our next scheduled telephone appointment.  Our next appointment is by telephone on Wednesday February 25, 2024 at 11:30 am  Please call the care guide team at [phone] if you need to cancel or reschedule your appointment.    Following are the goals we discussed today:  Patient Self Care Activities:  Attend all scheduled provider appointments Call pharmacy for medication refills 3-7 days in advance of running out of medications Call provider office for new concerns or questions  Participate in Transition of Care Program/Attend TOC scheduled calls Take medications as prescribed   Continue pacing activity to avoid episodes of shortness of breath and prevent falls If you believe your condition is getting worse- contact your care providers (doctors) promptly- reaching out to your doctor early when you have concerns can prevent you from having to go to the hospital  If you are experiencing a Mental Health or Behavioral Health Crisis or need someone to talk to, please  call the Suicide and Crisis Lifeline: 988 call the [place]  National Suicide Prevention Lifeline: 928-180-7049 or TTY: 470 420 5481 TTY 705-517-2742) to talk to a trained counselor call 1-800-273-TALK (toll free, 24 hour hotline) go to [facility] [address], [place] [phone]) call the [facility] Crisis Line: [phone] call 911   Care plan and visit instructions communicated with the patient verbally today. Patient agrees to receive a copy in MyChart. Active MyChart status and patient understanding of how to access instructions and care plan via MyChart confirmed with patient.     Pls call for questions,  Bryley Kovacevic Mckinney Ritvik Mczeal, RN, BSN, CCRN Alumnus RN Care Manager  Transitions of Care  [facility] - [facility]  Health [phone]: direct office Visit Information

## 2024-02-19 ENCOUNTER — Encounter: Payer: Self-pay | Admitting: Nurse Practitioner

## 2024-02-25 ENCOUNTER — Telehealth: Admitting: *Deleted

## 2024-04-13 ENCOUNTER — Ambulatory Visit: Admitting: Cardiology

## 2024-04-23 ENCOUNTER — Ambulatory Visit: Admitting: Cardiology

## 2024-05-13 ENCOUNTER — Ambulatory Visit: Admitting: Nurse Practitioner
